# Patient Record
Sex: Male | Born: 1955
Health system: Southern US, Community
[De-identification: ages and names within clinical notes are randomized; demographics above are authoritative.]

## PROBLEM LIST (undated history)

## (undated) DIAGNOSIS — G510 Bell's palsy: Secondary | ICD-10-CM

## (undated) DIAGNOSIS — I739 Peripheral vascular disease, unspecified: Secondary | ICD-10-CM

## (undated) DIAGNOSIS — G8929 Other chronic pain: Secondary | ICD-10-CM

## (undated) DIAGNOSIS — D649 Anemia, unspecified: Secondary | ICD-10-CM

## (undated) DIAGNOSIS — Z5189 Encounter for other specified aftercare: Secondary | ICD-10-CM

## (undated) DIAGNOSIS — N529 Male erectile dysfunction, unspecified: Secondary | ICD-10-CM

## (undated) DIAGNOSIS — F1910 Other psychoactive substance abuse, uncomplicated: Secondary | ICD-10-CM

## (undated) DIAGNOSIS — I10 Essential (primary) hypertension: Secondary | ICD-10-CM

## (undated) DIAGNOSIS — E119 Type 2 diabetes mellitus without complications: Secondary | ICD-10-CM

## (undated) DIAGNOSIS — IMO0001 Reserved for inherently not codable concepts without codable children: Secondary | ICD-10-CM

## (undated) DIAGNOSIS — R7303 Prediabetes: Secondary | ICD-10-CM

## (undated) DIAGNOSIS — Z8619 Personal history of other infectious and parasitic diseases: Secondary | ICD-10-CM

## (undated) DIAGNOSIS — K649 Unspecified hemorrhoids: Secondary | ICD-10-CM

## (undated) DIAGNOSIS — K219 Gastro-esophageal reflux disease without esophagitis: Secondary | ICD-10-CM

## (undated) DIAGNOSIS — E785 Hyperlipidemia, unspecified: Secondary | ICD-10-CM

## (undated) DIAGNOSIS — A048 Other specified bacterial intestinal infections: Secondary | ICD-10-CM

## (undated) DIAGNOSIS — M1990 Unspecified osteoarthritis, unspecified site: Secondary | ICD-10-CM

## (undated) DIAGNOSIS — I219 Acute myocardial infarction, unspecified: Secondary | ICD-10-CM

## (undated) DIAGNOSIS — M549 Dorsalgia, unspecified: Secondary | ICD-10-CM

## (undated) DIAGNOSIS — G4730 Sleep apnea, unspecified: Secondary | ICD-10-CM

## (undated) DIAGNOSIS — I2510 Atherosclerotic heart disease of native coronary artery without angina pectoris: Secondary | ICD-10-CM

## (undated) DIAGNOSIS — C259 Malignant neoplasm of pancreas, unspecified: Secondary | ICD-10-CM

## (undated) DIAGNOSIS — D352 Benign neoplasm of pituitary gland: Secondary | ICD-10-CM

## (undated) HISTORY — DX: Other specified bacterial intestinal infections: A04.8

## (undated) HISTORY — DX: Prediabetes: R73.03

## (undated) HISTORY — PX: OTHER SURGICAL HISTORY: SHX169

## (undated) HISTORY — DX: Benign neoplasm of pituitary gland: D35.2

## (undated) HISTORY — DX: Hyperlipidemia, unspecified: E78.5

## (undated) HISTORY — PX: BACK SURGERY: SHX140

## (undated) HISTORY — PX: JOINT REPLACEMENT: SHX530

## (undated) HISTORY — DX: Unspecified hemorrhoids: K64.9

## (undated) HISTORY — DX: Bell's palsy: G51.0

## (undated) HISTORY — DX: Type 2 diabetes mellitus without complications: E11.9

## (undated) HISTORY — DX: Peripheral vascular disease, unspecified: I73.9

## (undated) HISTORY — DX: Encounter for other specified aftercare: Z51.89

## (undated) HISTORY — DX: Gastro-esophageal reflux disease without esophagitis: K21.9

## (undated) HISTORY — DX: Unspecified osteoarthritis, unspecified site: M19.90

## (undated) HISTORY — PX: COLONOSCOPY: SHX174

## (undated) HISTORY — DX: Atherosclerotic heart disease of native coronary artery without angina pectoris: I25.10

## (undated) HISTORY — DX: Essential (primary) hypertension: I10

## (undated) HISTORY — DX: Anemia, unspecified: D64.9

## (undated) HISTORY — DX: Malignant neoplasm of pancreas, unspecified: C25.9

## (undated) HISTORY — DX: Male erectile dysfunction, unspecified: N52.9

---
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
## 2002-11-19 ENCOUNTER — Encounter: Payer: Self-pay | Admitting: *Deleted

## 2002-11-19 ENCOUNTER — Emergency Department (HOSPITAL_COMMUNITY): Admission: EM | Admit: 2002-11-19 | Discharge: 2002-11-19 | Payer: Self-pay | Admitting: *Deleted

## 2003-11-17 DIAGNOSIS — I2510 Atherosclerotic heart disease of native coronary artery without angina pectoris: Secondary | ICD-10-CM

## 2003-11-17 DIAGNOSIS — Z951 Presence of aortocoronary bypass graft: Secondary | ICD-10-CM | POA: Insufficient documentation

## 2003-11-17 DIAGNOSIS — I219 Acute myocardial infarction, unspecified: Secondary | ICD-10-CM | POA: Insufficient documentation

## 2004-04-02 ENCOUNTER — Inpatient Hospital Stay (HOSPITAL_COMMUNITY): Admission: EM | Admit: 2004-04-02 | Discharge: 2004-04-04 | Payer: Self-pay | Admitting: Emergency Medicine

## 2004-06-02 ENCOUNTER — Emergency Department (HOSPITAL_COMMUNITY): Admission: EM | Admit: 2004-06-02 | Discharge: 2004-06-02 | Payer: Self-pay | Admitting: Emergency Medicine

## 2007-08-13 ENCOUNTER — Emergency Department (HOSPITAL_COMMUNITY): Admission: EM | Admit: 2007-08-13 | Discharge: 2007-08-13 | Payer: Self-pay | Admitting: Emergency Medicine

## 2007-11-09 ENCOUNTER — Emergency Department (HOSPITAL_COMMUNITY): Admission: EM | Admit: 2007-11-09 | Discharge: 2007-11-09 | Payer: Self-pay | Admitting: Emergency Medicine

## 2007-11-09 ENCOUNTER — Encounter (INDEPENDENT_AMBULATORY_CARE_PROVIDER_SITE_OTHER): Payer: Self-pay | Admitting: Emergency Medicine

## 2007-11-09 ENCOUNTER — Ambulatory Visit: Payer: Self-pay | Admitting: *Deleted

## 2008-04-16 ENCOUNTER — Ambulatory Visit: Payer: Self-pay | Admitting: *Deleted

## 2008-04-16 ENCOUNTER — Encounter: Payer: Self-pay | Admitting: Emergency Medicine

## 2008-04-17 ENCOUNTER — Inpatient Hospital Stay (HOSPITAL_COMMUNITY): Admission: EM | Admit: 2008-04-17 | Discharge: 2008-04-20 | Payer: Self-pay | Admitting: Interventional Cardiology

## 2008-04-18 ENCOUNTER — Encounter (INDEPENDENT_AMBULATORY_CARE_PROVIDER_SITE_OTHER): Payer: Self-pay | Admitting: Interventional Cardiology

## 2008-04-18 ENCOUNTER — Ambulatory Visit: Payer: Self-pay | Admitting: Vascular Surgery

## 2008-04-20 ENCOUNTER — Encounter (INDEPENDENT_AMBULATORY_CARE_PROVIDER_SITE_OTHER): Payer: Self-pay | Admitting: Interventional Cardiology

## 2008-04-20 ENCOUNTER — Ambulatory Visit: Payer: Self-pay | Admitting: *Deleted

## 2008-04-24 ENCOUNTER — Encounter (INDEPENDENT_AMBULATORY_CARE_PROVIDER_SITE_OTHER): Payer: Self-pay | Admitting: Nurse Practitioner

## 2008-04-24 ENCOUNTER — Ambulatory Visit (HOSPITAL_COMMUNITY): Admission: RE | Admit: 2008-04-24 | Discharge: 2008-04-24 | Payer: Self-pay | Admitting: General Surgery

## 2008-05-02 ENCOUNTER — Encounter (INDEPENDENT_AMBULATORY_CARE_PROVIDER_SITE_OTHER): Payer: Self-pay | Admitting: Nurse Practitioner

## 2008-05-10 ENCOUNTER — Encounter (INDEPENDENT_AMBULATORY_CARE_PROVIDER_SITE_OTHER): Payer: Self-pay | Admitting: General Surgery

## 2008-05-10 ENCOUNTER — Inpatient Hospital Stay (HOSPITAL_COMMUNITY): Admission: RE | Admit: 2008-05-10 | Discharge: 2008-05-19 | Payer: Self-pay | Admitting: General Surgery

## 2008-05-10 ENCOUNTER — Encounter (INDEPENDENT_AMBULATORY_CARE_PROVIDER_SITE_OTHER): Payer: Self-pay | Admitting: Nurse Practitioner

## 2008-05-10 DIAGNOSIS — Z9089 Acquired absence of other organs: Secondary | ICD-10-CM | POA: Insufficient documentation

## 2008-05-10 DIAGNOSIS — C259 Malignant neoplasm of pancreas, unspecified: Secondary | ICD-10-CM | POA: Insufficient documentation

## 2008-05-10 HISTORY — PX: OTHER SURGICAL HISTORY: SHX169

## 2008-06-05 ENCOUNTER — Encounter (INDEPENDENT_AMBULATORY_CARE_PROVIDER_SITE_OTHER): Payer: Self-pay | Admitting: Nurse Practitioner

## 2008-06-05 DIAGNOSIS — I10 Essential (primary) hypertension: Secondary | ICD-10-CM | POA: Insufficient documentation

## 2008-06-05 DIAGNOSIS — E785 Hyperlipidemia, unspecified: Secondary | ICD-10-CM | POA: Insufficient documentation

## 2008-06-12 ENCOUNTER — Encounter (INDEPENDENT_AMBULATORY_CARE_PROVIDER_SITE_OTHER): Payer: Self-pay | Admitting: Nurse Practitioner

## 2008-06-14 ENCOUNTER — Ambulatory Visit: Payer: Self-pay | Admitting: Nurse Practitioner

## 2008-06-14 ENCOUNTER — Ambulatory Visit: Payer: Self-pay | Admitting: Oncology

## 2008-06-14 DIAGNOSIS — R5383 Other fatigue: Secondary | ICD-10-CM

## 2008-06-14 DIAGNOSIS — R5381 Other malaise: Secondary | ICD-10-CM | POA: Insufficient documentation

## 2008-06-14 LAB — CONVERTED CEMR LAB
Nitrite: NEGATIVE
Protein, U semiquant: 30
Specific Gravity, Urine: 1.015
Urobilinogen, UA: 0.2
WBC Urine, dipstick: NEGATIVE

## 2008-06-21 LAB — COMPREHENSIVE METABOLIC PANEL
ALT: 39 U/L (ref 0–53)
AST: 19 U/L (ref 0–37)
Albumin: 4.5 g/dL (ref 3.5–5.2)
Calcium: 9.6 mg/dL (ref 8.4–10.5)
Chloride: 103 mEq/L (ref 96–112)
Potassium: 4.3 mEq/L (ref 3.5–5.3)

## 2008-06-21 LAB — CBC WITH DIFFERENTIAL/PLATELET
BASO%: 0.1 % (ref 0.0–2.0)
Eosinophils Absolute: 0.2 10*3/uL (ref 0.0–0.5)
HCT: 33.5 % — ABNORMAL LOW (ref 38.7–49.9)
MCHC: 33.9 g/dL (ref 32.0–35.9)
MONO#: 0.8 10*3/uL (ref 0.1–0.9)
NEUT#: 3.4 10*3/uL (ref 1.5–6.5)
NEUT%: 40.3 % (ref 40.0–75.0)
WBC: 8.4 10*3/uL (ref 4.0–10.0)
lymph#: 4.1 10*3/uL — ABNORMAL HIGH (ref 0.9–3.3)

## 2008-06-21 LAB — LACTATE DEHYDROGENASE: LDH: 186 U/L (ref 94–250)

## 2008-08-03 ENCOUNTER — Ambulatory Visit: Payer: Self-pay | Admitting: Oncology

## 2008-08-07 ENCOUNTER — Telehealth (INDEPENDENT_AMBULATORY_CARE_PROVIDER_SITE_OTHER): Payer: Self-pay | Admitting: Nurse Practitioner

## 2008-08-07 LAB — COMPREHENSIVE METABOLIC PANEL
ALT: 16 U/L (ref 0–53)
BUN: 12 mg/dL (ref 6–23)
CO2: 24 mEq/L (ref 19–32)
Calcium: 9.8 mg/dL (ref 8.4–10.5)
Chloride: 105 mEq/L (ref 96–112)
Creatinine, Ser: 0.92 mg/dL (ref 0.40–1.50)
Glucose, Bld: 110 mg/dL — ABNORMAL HIGH (ref 70–99)

## 2008-08-07 LAB — CBC WITH DIFFERENTIAL/PLATELET
BASO%: 1 % (ref 0.0–2.0)
Eosinophils Absolute: 0.1 10*3/uL (ref 0.0–0.5)
HCT: 38.2 % — ABNORMAL LOW (ref 38.7–49.9)
MCHC: 33.7 g/dL (ref 32.0–35.9)
MONO#: 0.4 10*3/uL (ref 0.1–0.9)
NEUT#: 2.2 10*3/uL (ref 1.5–6.5)
NEUT%: 37.7 % — ABNORMAL LOW (ref 40.0–75.0)
WBC: 5.8 10*3/uL (ref 4.0–10.0)
lymph#: 3 10*3/uL (ref 0.9–3.3)

## 2008-08-07 LAB — LACTATE DEHYDROGENASE: LDH: 155 U/L (ref 94–250)

## 2008-08-09 ENCOUNTER — Ambulatory Visit (HOSPITAL_COMMUNITY): Admission: RE | Admit: 2008-08-09 | Discharge: 2008-08-09 | Payer: Self-pay | Admitting: Oncology

## 2008-08-10 ENCOUNTER — Ambulatory Visit: Payer: Self-pay | Admitting: Nurse Practitioner

## 2008-08-22 ENCOUNTER — Ambulatory Visit: Payer: Self-pay | Admitting: Nurse Practitioner

## 2008-09-13 ENCOUNTER — Ambulatory Visit (HOSPITAL_COMMUNITY): Admission: RE | Admit: 2008-09-13 | Discharge: 2008-09-13 | Payer: Self-pay | Admitting: General Surgery

## 2008-09-26 ENCOUNTER — Ambulatory Visit: Payer: Self-pay | Admitting: Nurse Practitioner

## 2008-10-04 ENCOUNTER — Telehealth (INDEPENDENT_AMBULATORY_CARE_PROVIDER_SITE_OTHER): Payer: Self-pay | Admitting: Nurse Practitioner

## 2008-10-08 ENCOUNTER — Telehealth (INDEPENDENT_AMBULATORY_CARE_PROVIDER_SITE_OTHER): Payer: Self-pay | Admitting: Nurse Practitioner

## 2008-10-16 ENCOUNTER — Emergency Department (HOSPITAL_COMMUNITY): Admission: EM | Admit: 2008-10-16 | Discharge: 2008-10-16 | Payer: Self-pay | Admitting: Emergency Medicine

## 2008-10-22 ENCOUNTER — Telehealth (INDEPENDENT_AMBULATORY_CARE_PROVIDER_SITE_OTHER): Payer: Self-pay | Admitting: *Deleted

## 2008-10-25 ENCOUNTER — Ambulatory Visit: Payer: Self-pay | Admitting: Nurse Practitioner

## 2008-10-26 ENCOUNTER — Ambulatory Visit: Payer: Self-pay | Admitting: *Deleted

## 2008-10-26 ENCOUNTER — Encounter (INDEPENDENT_AMBULATORY_CARE_PROVIDER_SITE_OTHER): Payer: Self-pay | Admitting: Nurse Practitioner

## 2008-10-26 DIAGNOSIS — E7800 Pure hypercholesterolemia, unspecified: Secondary | ICD-10-CM | POA: Insufficient documentation

## 2008-10-26 LAB — CONVERTED CEMR LAB
Alkaline Phosphatase: 93 units/L (ref 39–117)
Eosinophils Absolute: 0.1 10*3/uL (ref 0.0–0.7)
Eosinophils Relative: 2 % (ref 0–5)
Glucose, Bld: 104 mg/dL — ABNORMAL HIGH (ref 70–99)
HCT: 41.6 % (ref 39.0–52.0)
HDL: 35 mg/dL — ABNORMAL LOW (ref >39)
LDL Cholesterol: 216 mg/dL — ABNORMAL HIGH (ref 0–99)
Lymphs Abs: 3.1 10*3/uL (ref 0.7–4.0)
MCV: 92.4 fL (ref 78.0–100.0)
Platelets: 298 10*3/uL (ref 150–400)
RDW: 14.3 % (ref 11.5–15.5)
Sodium: 142 meq/L (ref 135–145)
Total Bilirubin: 0.6 mg/dL (ref 0.3–1.2)
Total CHOL/HDL Ratio: 8.4
Total Protein: 8.4 g/dL — ABNORMAL HIGH (ref 6.0–8.3)
Triglycerides: 210 mg/dL — ABNORMAL HIGH (ref <150)
VLDL: 42 mg/dL — ABNORMAL HIGH (ref 0–40)
WBC: 5.8 10*3/uL (ref 4.0–10.5)

## 2008-10-31 ENCOUNTER — Emergency Department (HOSPITAL_COMMUNITY): Admission: EM | Admit: 2008-10-31 | Discharge: 2008-10-31 | Payer: Self-pay | Admitting: Emergency Medicine

## 2008-11-02 ENCOUNTER — Ambulatory Visit: Payer: Self-pay | Admitting: Oncology

## 2008-11-06 ENCOUNTER — Emergency Department (HOSPITAL_COMMUNITY): Admission: EM | Admit: 2008-11-06 | Discharge: 2008-11-06 | Payer: Self-pay | Admitting: Emergency Medicine

## 2008-11-06 ENCOUNTER — Encounter (INDEPENDENT_AMBULATORY_CARE_PROVIDER_SITE_OTHER): Payer: Self-pay | Admitting: Nurse Practitioner

## 2008-11-06 LAB — CBC WITH DIFFERENTIAL/PLATELET
Basophils Absolute: 0 10*3/uL (ref 0.0–0.1)
Eosinophils Absolute: 0.1 10*3/uL (ref 0.0–0.5)
HCT: 40 % (ref 38.7–49.9)
LYMPH%: 59.9 % — ABNORMAL HIGH (ref 14.0–48.0)
MCV: 91.7 fL (ref 81.6–98.0)
MONO#: 0.4 10*3/uL (ref 0.1–0.9)
MONO%: 7 % (ref 0.0–13.0)
NEUT#: 1.7 10*3/uL (ref 1.5–6.5)
NEUT%: 30.3 % — ABNORMAL LOW (ref 40.0–75.0)
Platelets: 308 10*3/uL (ref 145–400)
WBC: 5.6 10*3/uL (ref 4.0–10.0)

## 2008-11-06 LAB — COMPREHENSIVE METABOLIC PANEL
BUN: 10 mg/dL (ref 6–23)
CO2: 27 mEq/L (ref 19–32)
Creatinine, Ser: 1.1 mg/dL (ref 0.40–1.50)
Glucose, Bld: 126 mg/dL — ABNORMAL HIGH (ref 70–99)
Total Bilirubin: 0.5 mg/dL (ref 0.3–1.2)
Total Protein: 8.1 g/dL (ref 6.0–8.3)

## 2008-11-06 LAB — LACTATE DEHYDROGENASE: LDH: 153 U/L (ref 94–250)

## 2008-11-26 ENCOUNTER — Ambulatory Visit: Payer: Self-pay | Admitting: Internal Medicine

## 2008-11-29 ENCOUNTER — Encounter (INDEPENDENT_AMBULATORY_CARE_PROVIDER_SITE_OTHER): Payer: Self-pay | Admitting: Nurse Practitioner

## 2008-12-05 ENCOUNTER — Encounter (INDEPENDENT_AMBULATORY_CARE_PROVIDER_SITE_OTHER): Payer: Self-pay | Admitting: Cardiology

## 2008-12-05 ENCOUNTER — Ambulatory Visit (HOSPITAL_COMMUNITY): Admission: RE | Admit: 2008-12-05 | Discharge: 2008-12-05 | Payer: Self-pay | Admitting: Cardiology

## 2008-12-05 ENCOUNTER — Ambulatory Visit: Payer: Self-pay | Admitting: Vascular Surgery

## 2008-12-18 ENCOUNTER — Emergency Department (HOSPITAL_COMMUNITY): Admission: EM | Admit: 2008-12-18 | Discharge: 2008-12-18 | Payer: Self-pay | Admitting: Emergency Medicine

## 2008-12-18 ENCOUNTER — Ambulatory Visit (HOSPITAL_COMMUNITY): Admission: RE | Admit: 2008-12-18 | Discharge: 2008-12-18 | Payer: Self-pay | Admitting: Family Medicine

## 2008-12-18 ENCOUNTER — Ambulatory Visit: Payer: Self-pay | Admitting: Family Medicine

## 2008-12-18 DIAGNOSIS — R29810 Facial weakness: Secondary | ICD-10-CM | POA: Insufficient documentation

## 2008-12-19 ENCOUNTER — Ambulatory Visit: Payer: Self-pay | Admitting: Family Medicine

## 2008-12-19 ENCOUNTER — Encounter (INDEPENDENT_AMBULATORY_CARE_PROVIDER_SITE_OTHER): Payer: Self-pay | Admitting: Nurse Practitioner

## 2008-12-25 ENCOUNTER — Ambulatory Visit: Payer: Self-pay | Admitting: Family Medicine

## 2008-12-25 DIAGNOSIS — G510 Bell's palsy: Secondary | ICD-10-CM | POA: Insufficient documentation

## 2009-01-01 ENCOUNTER — Encounter (INDEPENDENT_AMBULATORY_CARE_PROVIDER_SITE_OTHER): Payer: Self-pay | Admitting: Family Medicine

## 2009-01-02 ENCOUNTER — Encounter: Admission: RE | Admit: 2009-01-02 | Discharge: 2009-01-02 | Payer: Self-pay | Admitting: Cardiology

## 2009-01-07 ENCOUNTER — Telehealth (INDEPENDENT_AMBULATORY_CARE_PROVIDER_SITE_OTHER): Payer: Self-pay | Admitting: *Deleted

## 2009-01-08 ENCOUNTER — Inpatient Hospital Stay (HOSPITAL_COMMUNITY): Admission: AD | Admit: 2009-01-08 | Discharge: 2009-01-09 | Payer: Self-pay | Admitting: Cardiology

## 2009-01-08 DIAGNOSIS — I739 Peripheral vascular disease, unspecified: Secondary | ICD-10-CM | POA: Insufficient documentation

## 2009-01-21 ENCOUNTER — Ambulatory Visit: Payer: Self-pay | Admitting: Family Medicine

## 2009-02-01 ENCOUNTER — Ambulatory Visit: Payer: Self-pay | Admitting: Oncology

## 2009-02-05 ENCOUNTER — Encounter (INDEPENDENT_AMBULATORY_CARE_PROVIDER_SITE_OTHER): Payer: Self-pay | Admitting: Nurse Practitioner

## 2009-02-05 LAB — CEA: CEA: 3.4 ng/mL (ref 0.0–5.0)

## 2009-02-05 LAB — COMPREHENSIVE METABOLIC PANEL
BUN: 14 mg/dL (ref 6–23)
CO2: 24 mEq/L (ref 19–32)
Creatinine, Ser: 1.2 mg/dL (ref 0.40–1.50)
Glucose, Bld: 144 mg/dL — ABNORMAL HIGH (ref 70–99)
Total Bilirubin: 0.3 mg/dL (ref 0.3–1.2)

## 2009-02-05 LAB — CBC WITH DIFFERENTIAL/PLATELET
Basophils Absolute: 0.1 10*3/uL (ref 0.0–0.1)
Eosinophils Absolute: 0.5 10*3/uL (ref 0.0–0.5)
HGB: 12.1 g/dL — ABNORMAL LOW (ref 13.0–17.1)
LYMPH%: 53.4 % — ABNORMAL HIGH (ref 14.0–49.0)
MCV: 92.9 fL (ref 79.3–98.0)
MONO%: 11 % (ref 0.0–14.0)
NEUT#: 1.3 10*3/uL — ABNORMAL LOW (ref 1.5–6.5)
NEUT%: 24.7 % — ABNORMAL LOW (ref 39.0–75.0)
Platelets: 384 10*3/uL (ref 140–400)
RBC: 3.92 10*6/uL — ABNORMAL LOW (ref 4.20–5.82)

## 2009-02-05 LAB — CANCER ANTIGEN 19-9: CA 19-9: 10.5 U/mL (ref <35.0)

## 2009-02-08 ENCOUNTER — Encounter (INDEPENDENT_AMBULATORY_CARE_PROVIDER_SITE_OTHER): Payer: Self-pay | Admitting: Nurse Practitioner

## 2009-02-12 ENCOUNTER — Encounter (INDEPENDENT_AMBULATORY_CARE_PROVIDER_SITE_OTHER): Payer: Self-pay | Admitting: Nurse Practitioner

## 2009-02-18 ENCOUNTER — Encounter (INDEPENDENT_AMBULATORY_CARE_PROVIDER_SITE_OTHER): Payer: Self-pay | Admitting: Nurse Practitioner

## 2009-02-18 ENCOUNTER — Ambulatory Visit (HOSPITAL_COMMUNITY): Admission: RE | Admit: 2009-02-18 | Discharge: 2009-02-18 | Payer: Self-pay | Admitting: Cardiology

## 2009-02-26 ENCOUNTER — Encounter (INDEPENDENT_AMBULATORY_CARE_PROVIDER_SITE_OTHER): Payer: Self-pay | Admitting: Nurse Practitioner

## 2009-03-06 ENCOUNTER — Ambulatory Visit: Payer: Self-pay | Admitting: Family Medicine

## 2009-03-06 DIAGNOSIS — M25519 Pain in unspecified shoulder: Secondary | ICD-10-CM | POA: Insufficient documentation

## 2009-04-16 ENCOUNTER — Ambulatory Visit: Payer: Self-pay | Admitting: Family Medicine

## 2009-04-16 DIAGNOSIS — N529 Male erectile dysfunction, unspecified: Secondary | ICD-10-CM | POA: Insufficient documentation

## 2009-04-17 ENCOUNTER — Encounter (INDEPENDENT_AMBULATORY_CARE_PROVIDER_SITE_OTHER): Payer: Self-pay | Admitting: Nurse Practitioner

## 2009-04-19 ENCOUNTER — Telehealth (INDEPENDENT_AMBULATORY_CARE_PROVIDER_SITE_OTHER): Payer: Self-pay | Admitting: Family Medicine

## 2009-05-07 ENCOUNTER — Ambulatory Visit: Payer: Self-pay | Admitting: Oncology

## 2009-05-09 ENCOUNTER — Encounter (INDEPENDENT_AMBULATORY_CARE_PROVIDER_SITE_OTHER): Payer: Self-pay | Admitting: Nurse Practitioner

## 2009-05-09 LAB — COMPREHENSIVE METABOLIC PANEL
ALT: 22 U/L (ref 0–53)
CO2: 24 mEq/L (ref 19–32)
Chloride: 103 mEq/L (ref 96–112)
Potassium: 4.5 mEq/L (ref 3.5–5.3)
Total Bilirubin: 0.3 mg/dL (ref 0.3–1.2)
Total Protein: 7.4 g/dL (ref 6.0–8.3)

## 2009-05-09 LAB — CBC WITH DIFFERENTIAL/PLATELET
Basophils Absolute: 0 10*3/uL (ref 0.0–0.1)
EOS%: 2.5 % (ref 0.0–7.0)
Eosinophils Absolute: 0.1 10*3/uL (ref 0.0–0.5)
HCT: 34 % — ABNORMAL LOW (ref 38.4–49.9)
HGB: 11.7 g/dL — ABNORMAL LOW (ref 13.0–17.1)
MCH: 31.6 pg (ref 27.2–33.4)
MCV: 91.5 fL (ref 79.3–98.0)
MONO%: 8.8 % (ref 0.0–14.0)
NEUT#: 1.1 10*3/uL — ABNORMAL LOW (ref 1.5–6.5)
NEUT%: 25.3 % — ABNORMAL LOW (ref 39.0–75.0)

## 2009-05-09 LAB — LACTATE DEHYDROGENASE: LDH: 155 U/L (ref 94–250)

## 2009-05-10 ENCOUNTER — Ambulatory Visit (HOSPITAL_COMMUNITY): Admission: RE | Admit: 2009-05-10 | Discharge: 2009-05-10 | Payer: Self-pay | Admitting: Oncology

## 2009-07-15 ENCOUNTER — Ambulatory Visit: Payer: Self-pay | Admitting: Family Medicine

## 2009-08-07 ENCOUNTER — Ambulatory Visit: Payer: Self-pay | Admitting: Oncology

## 2009-08-14 ENCOUNTER — Ambulatory Visit: Payer: Self-pay | Admitting: Nurse Practitioner

## 2009-08-14 DIAGNOSIS — L280 Lichen simplex chronicus: Secondary | ICD-10-CM | POA: Insufficient documentation

## 2009-08-14 DIAGNOSIS — F17200 Nicotine dependence, unspecified, uncomplicated: Secondary | ICD-10-CM | POA: Insufficient documentation

## 2009-09-02 ENCOUNTER — Telehealth (INDEPENDENT_AMBULATORY_CARE_PROVIDER_SITE_OTHER): Payer: Self-pay | Admitting: Nurse Practitioner

## 2009-09-06 ENCOUNTER — Ambulatory Visit: Payer: Self-pay | Admitting: Oncology

## 2009-09-10 ENCOUNTER — Telehealth (INDEPENDENT_AMBULATORY_CARE_PROVIDER_SITE_OTHER): Payer: Self-pay | Admitting: Nurse Practitioner

## 2009-09-13 ENCOUNTER — Ambulatory Visit: Payer: Self-pay | Admitting: Nurse Practitioner

## 2009-09-19 DIAGNOSIS — E291 Testicular hypofunction: Secondary | ICD-10-CM | POA: Insufficient documentation

## 2009-09-19 LAB — CONVERTED CEMR LAB
AST: 31 units/L (ref 0–37)
Alkaline Phosphatase: 70 units/L (ref 39–117)
BUN: 14 mg/dL (ref 6–23)
Creatinine, Ser: 1.1 mg/dL (ref 0.40–1.50)
HDL: 39 mg/dL — ABNORMAL LOW (ref >39)
LDL Cholesterol: 89 mg/dL (ref 0–99)
Potassium: 5.3 meq/L (ref 3.5–5.3)
TSH: 1.452 microintl units/mL (ref 0.350–4.500)
Total Bilirubin: 0.5 mg/dL (ref 0.3–1.2)
Total CHOL/HDL Ratio: 3.8
VLDL: 19 mg/dL (ref 0–40)

## 2009-09-20 ENCOUNTER — Ambulatory Visit: Payer: Self-pay | Admitting: Nurse Practitioner

## 2009-09-20 DIAGNOSIS — K921 Melena: Secondary | ICD-10-CM | POA: Insufficient documentation

## 2009-10-22 ENCOUNTER — Encounter (INDEPENDENT_AMBULATORY_CARE_PROVIDER_SITE_OTHER): Payer: Self-pay | Admitting: Nurse Practitioner

## 2009-10-23 ENCOUNTER — Encounter (INDEPENDENT_AMBULATORY_CARE_PROVIDER_SITE_OTHER): Payer: Self-pay | Admitting: Nurse Practitioner

## 2009-10-23 ENCOUNTER — Ambulatory Visit (HOSPITAL_COMMUNITY): Admission: RE | Admit: 2009-10-23 | Discharge: 2009-10-23 | Payer: Self-pay | Admitting: Gastroenterology

## 2009-10-24 DIAGNOSIS — K649 Unspecified hemorrhoids: Secondary | ICD-10-CM | POA: Insufficient documentation

## 2009-10-31 ENCOUNTER — Telehealth (INDEPENDENT_AMBULATORY_CARE_PROVIDER_SITE_OTHER): Payer: Self-pay | Admitting: Nurse Practitioner

## 2009-12-06 LAB — CONVERTED CEMR LAB
Hemoglobin: 13.6 g/dL
RBC: 4.48 M/uL
RDW: 14.7 %

## 2010-01-06 ENCOUNTER — Encounter: Payer: Self-pay | Admitting: Emergency Medicine

## 2010-01-06 LAB — CONVERTED CEMR LAB
ALT: 24 units/L
AST: 26 units/L
Alkaline Phosphatase: 73 units/L
BUN: 7 mg/dL
CO2: 29 meq/L
Chloride: 102 meq/L
Creatinine, Ser: 0.82 mg/dL
Glucose, Bld: 108 mg/dL
Potassium: 4 meq/L
Sodium: 137 meq/L
Total Bilirubin: 0.7 mg/dL

## 2010-01-07 ENCOUNTER — Inpatient Hospital Stay (HOSPITAL_COMMUNITY): Admission: AD | Admit: 2010-01-07 | Discharge: 2010-01-10 | Payer: Self-pay | Admitting: Cardiology

## 2010-01-13 ENCOUNTER — Inpatient Hospital Stay (HOSPITAL_COMMUNITY): Admission: EM | Admit: 2010-01-13 | Discharge: 2010-01-14 | Payer: Self-pay | Admitting: Emergency Medicine

## 2010-01-15 ENCOUNTER — Ambulatory Visit: Payer: Self-pay | Admitting: Oncology

## 2010-01-16 ENCOUNTER — Encounter (INDEPENDENT_AMBULATORY_CARE_PROVIDER_SITE_OTHER): Payer: Self-pay | Admitting: Nurse Practitioner

## 2010-01-17 ENCOUNTER — Telehealth (INDEPENDENT_AMBULATORY_CARE_PROVIDER_SITE_OTHER): Payer: Self-pay | Admitting: Nurse Practitioner

## 2010-01-28 ENCOUNTER — Encounter (INDEPENDENT_AMBULATORY_CARE_PROVIDER_SITE_OTHER): Payer: Self-pay | Admitting: *Deleted

## 2010-02-01 ENCOUNTER — Emergency Department (HOSPITAL_COMMUNITY): Admission: EM | Admit: 2010-02-01 | Discharge: 2010-02-01 | Payer: Self-pay | Admitting: Emergency Medicine

## 2010-02-05 ENCOUNTER — Encounter (INDEPENDENT_AMBULATORY_CARE_PROVIDER_SITE_OTHER): Payer: Self-pay | Admitting: Nurse Practitioner

## 2010-02-05 LAB — CBC WITH DIFFERENTIAL/PLATELET
Basophils Absolute: 0 10*3/uL (ref 0.0–0.1)
EOS%: 2.2 % (ref 0.0–7.0)
HGB: 12.6 g/dL — ABNORMAL LOW (ref 13.0–17.1)
MCH: 31 pg (ref 27.2–33.4)
MONO#: 0.4 10*3/uL (ref 0.1–0.9)
NEUT#: 2.2 10*3/uL (ref 1.5–6.5)
RDW: 14 % (ref 11.0–14.6)
WBC: 4.9 10*3/uL (ref 4.0–10.3)
lymph#: 2.1 10*3/uL (ref 0.9–3.3)

## 2010-02-05 LAB — COMPREHENSIVE METABOLIC PANEL
Alkaline Phosphatase: 66 U/L (ref 39–117)
BUN: 12 mg/dL (ref 6–23)
CO2: 30 mEq/L (ref 19–32)
Chloride: 97 mEq/L (ref 96–112)
Potassium: 4 mEq/L (ref 3.5–5.3)
Total Bilirubin: 0.4 mg/dL (ref 0.3–1.2)
Total Protein: 7.4 g/dL (ref 6.0–8.3)

## 2010-02-05 LAB — LACTATE DEHYDROGENASE: LDH: 124 U/L (ref 94–250)

## 2010-02-05 LAB — CEA: CEA: 2 ng/mL (ref 0.0–5.0)

## 2010-02-06 ENCOUNTER — Encounter (INDEPENDENT_AMBULATORY_CARE_PROVIDER_SITE_OTHER): Payer: Self-pay | Admitting: Nurse Practitioner

## 2010-02-06 ENCOUNTER — Ambulatory Visit (HOSPITAL_COMMUNITY): Admission: RE | Admit: 2010-02-06 | Discharge: 2010-02-06 | Payer: Self-pay | Admitting: Oncology

## 2010-02-10 ENCOUNTER — Ambulatory Visit: Payer: Self-pay | Admitting: Nurse Practitioner

## 2010-02-10 DIAGNOSIS — A048 Other specified bacterial intestinal infections: Secondary | ICD-10-CM | POA: Insufficient documentation

## 2010-03-06 ENCOUNTER — Encounter (HOSPITAL_COMMUNITY): Admission: RE | Admit: 2010-03-06 | Discharge: 2010-03-17 | Payer: Self-pay | Admitting: Cardiovascular Disease

## 2010-03-11 ENCOUNTER — Ambulatory Visit: Payer: Self-pay | Admitting: Nurse Practitioner

## 2010-03-11 DIAGNOSIS — K219 Gastro-esophageal reflux disease without esophagitis: Secondary | ICD-10-CM | POA: Insufficient documentation

## 2010-03-11 LAB — CONVERTED CEMR LAB
Bilirubin Urine: NEGATIVE
Cholesterol, target level: 200 mg/dL
Glucose, Urine, Semiquant: NEGATIVE
HDL goal, serum: 40 mg/dL
Ketones, urine, test strip: NEGATIVE
LDL Goal: 100 mg/dL
Specific Gravity, Urine: 1.01
Urobilinogen, UA: 0.2
pH: 6

## 2010-05-31 IMAGING — CT CT PELVIS W/ CM
2 of 5 series · 17 of 46 positions shown, 19 images · IV contrast (agent unspecified)
Comparison: Chest CT [DATE]

CT ABDOMEN

CLINICAL DATA: Abdominal mass seen at chest CT.

CT ABDOMEN AND PELVIS WITH CONTRAST
TECHNIQUE: Multidetector CT imaging of the abdomen and pelvis was
performed using the standard protocol following bolus
administration of intravenous contrast.
Contrast: 100 ml [id number]

[Series 2: routine abdomen · axial · 0.70mm/px · z∈[-482,-72]mm · 14 of 93 slices shown, 16 images]
[im 6/93  soft-tissue]
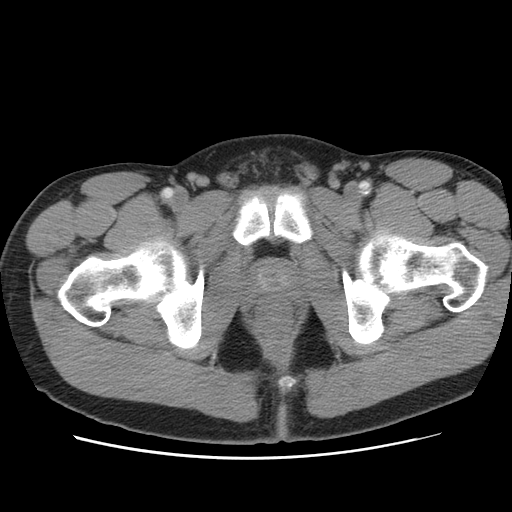
[im 6/93  bone]
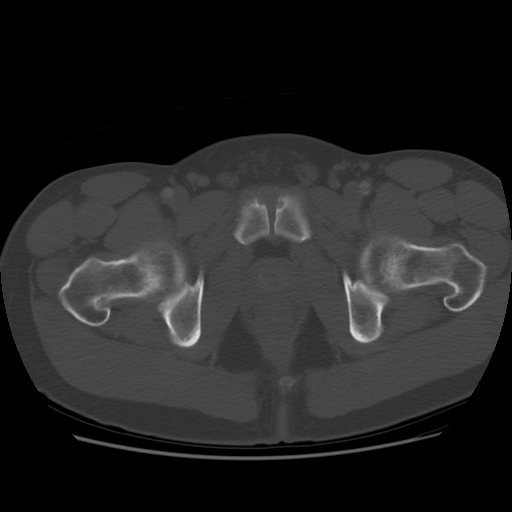
[im 11/93  soft-tissue]
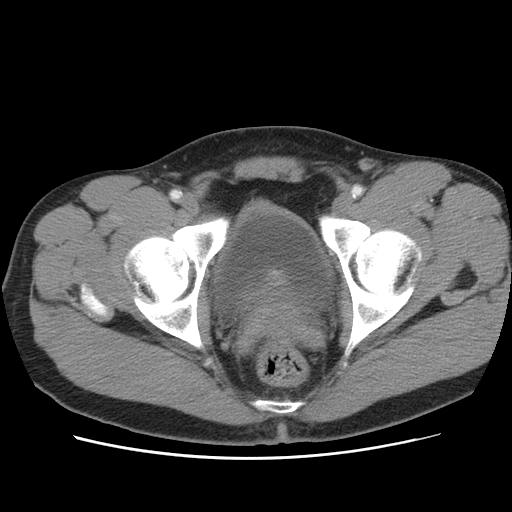
[im 17/93  soft-tissue]
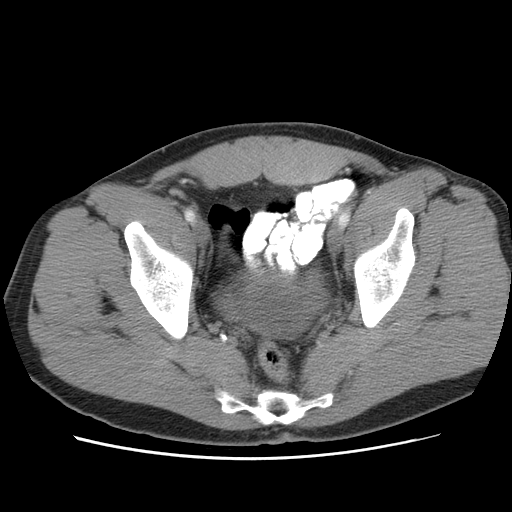
[im 28/93  soft-tissue]
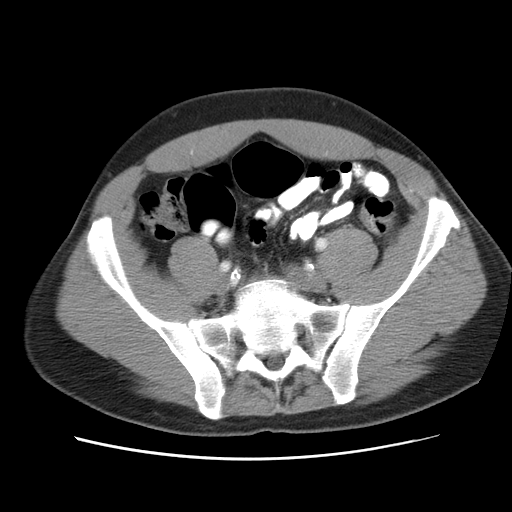
[im 33/93  soft-tissue]
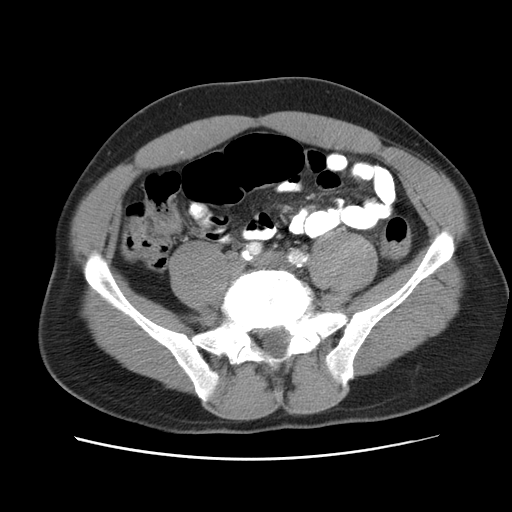
[im 38/93  soft-tissue]
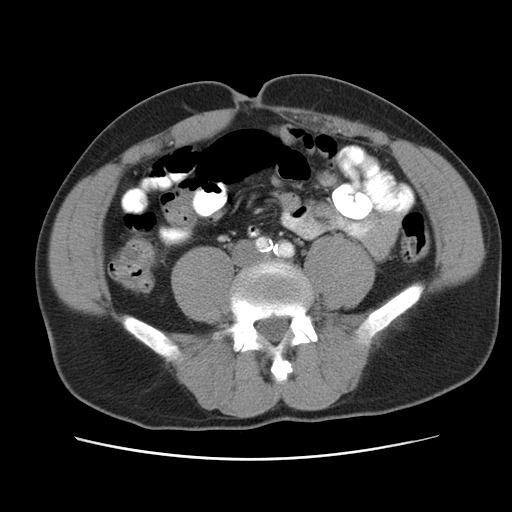
[im 44/93  soft-tissue]
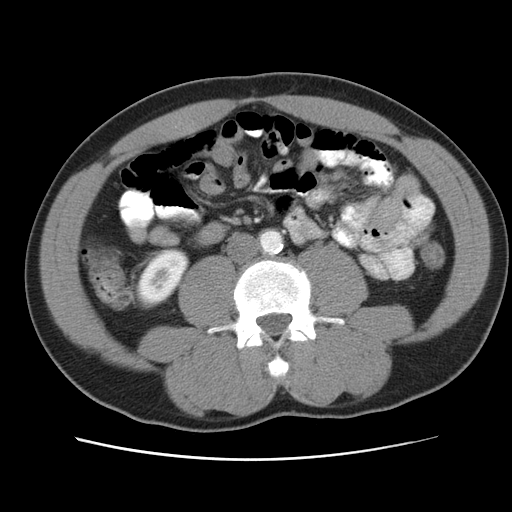
[im 49/93  soft-tissue]
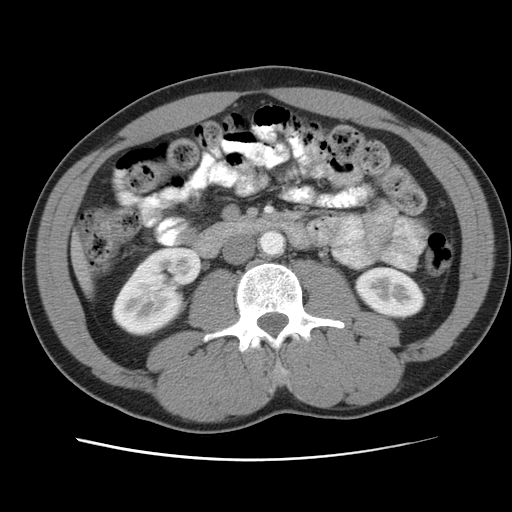
[im 55/93  soft-tissue]
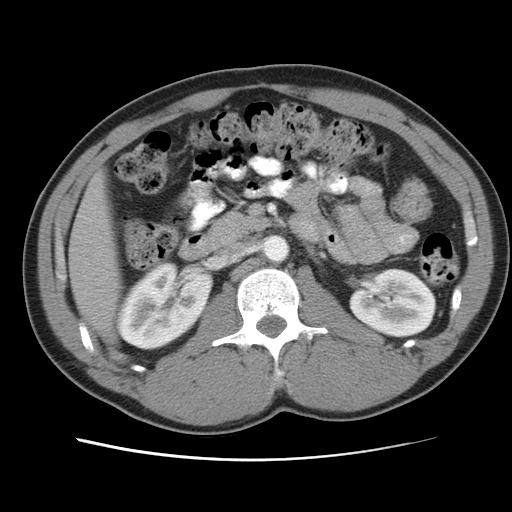
[im 55/93  bone]
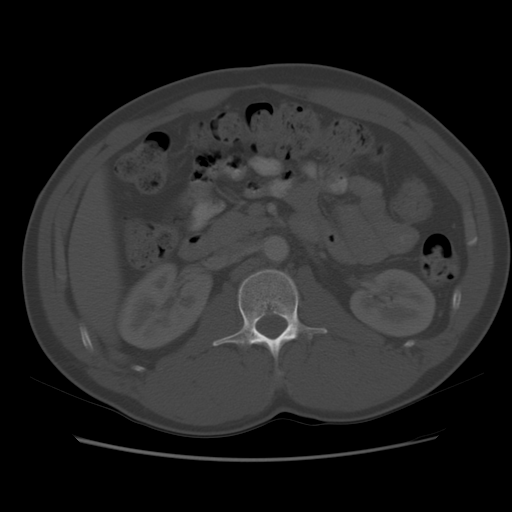
[im 60/93  soft-tissue]
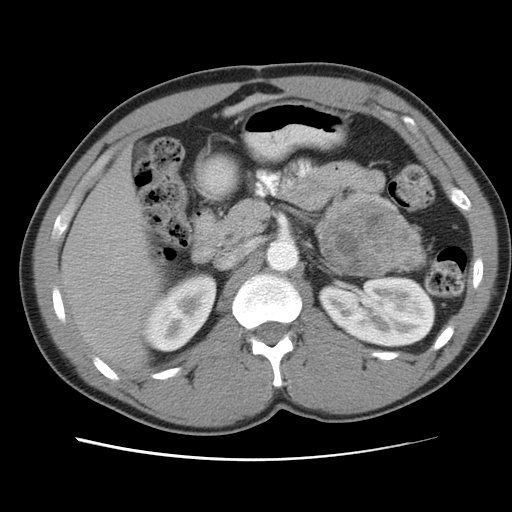
[im 71/93  soft-tissue]
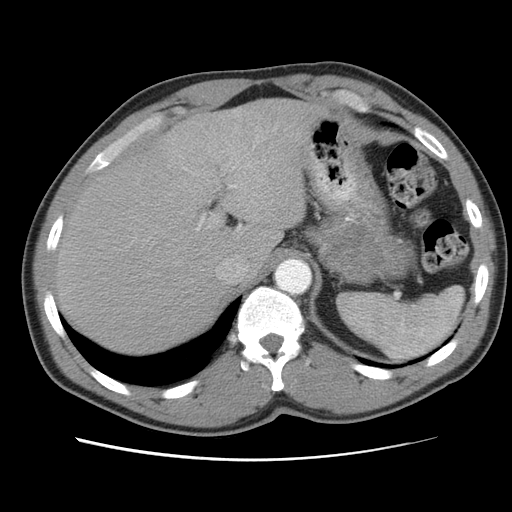
[im 76/93  soft-tissue]
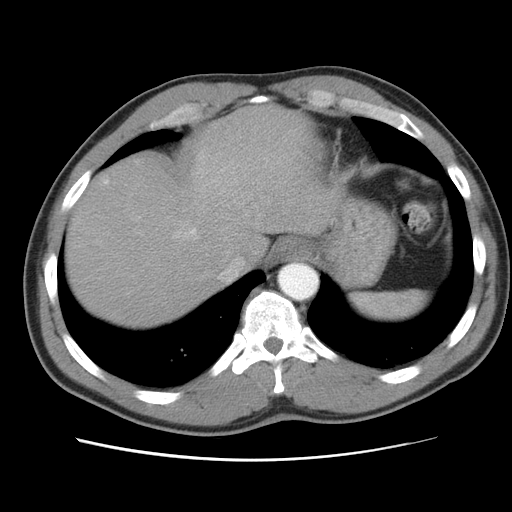
[im 82/93  soft-tissue]
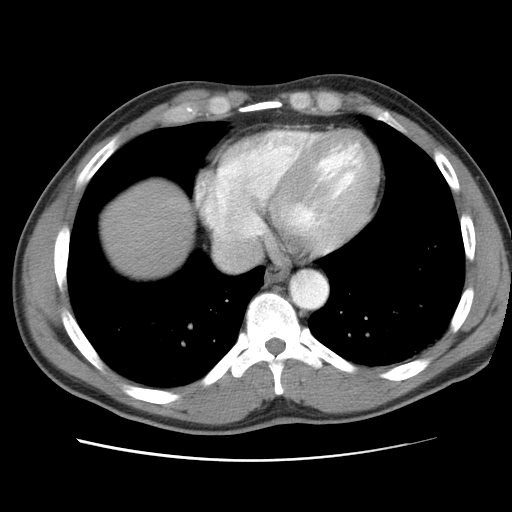
[im 87/93  soft-tissue]
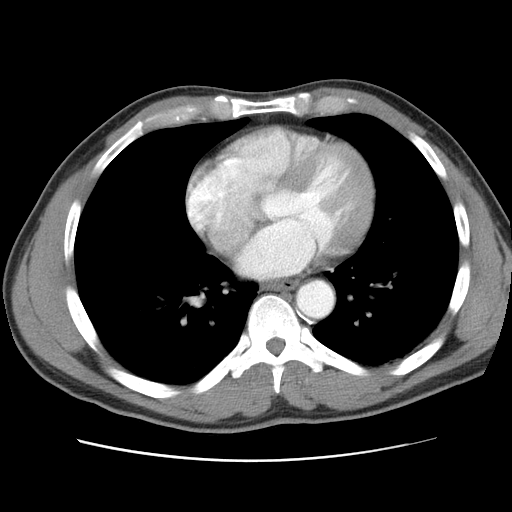

[Series 401: reformatted · coronal · 0.90mm/px · 3 of 113 slices shown]
[im 38/113  soft-tissue]
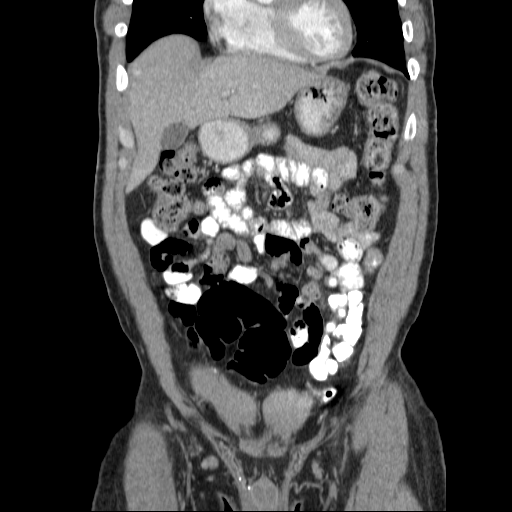
[im 50/113  soft-tissue]
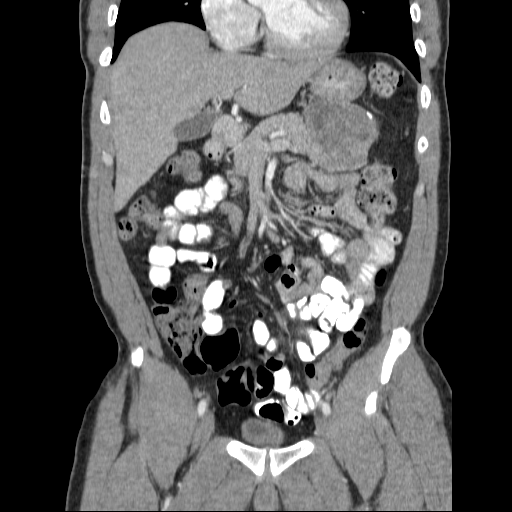
[im 63/113  soft-tissue]
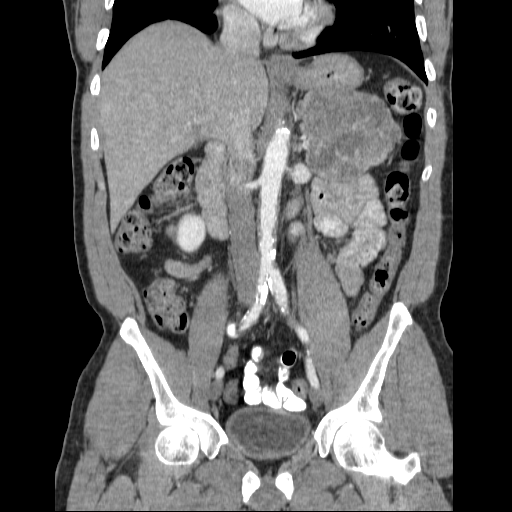

[17 of 46 positions shown; findings below may reference images not displayed]

FINDINGS: The liver contains an 8 mm low density in the lateral
segment of the left lobe.  This is nonspecific.  Elsewhere the
liver, I believe we are seeing some early areas of inhomogeneous
enhancement due to the arterial phase.  I do not suspect any other
liver abnormalities.  No calcified gallstones.  Spleen is normal.
There is a mass of the tail of the pancreas and measures 9.5 cm in
diameter.  This shows some peripheral and internal calcification.
This is a mixed density lesion.  This could represent a cystic
neoplasm of the pancreas, but adenocarcinoma is not excluded.
Remainder the pancreas appears normal.  The lesion touches the
posterior aspect of the stomach and has a surface also along the
left renal vein.  These structures do not appear invaded or
encompassed.  The adrenal glands are normal.  The kidneys are
normal.  The aorta and IVC show no acute pathology.  The patient
does have atherosclerosis.  No free fluid or air.  No primary bowel
pathology.
IMPRESSION: 9.5 cm mixed density lesion in the tail of pancreas that could be a
cystic neoplasm of the pancreas or could be an adenocarcinoma.  8
mm low density in the lateral segment of the left lobe of liver
which is nonspecific.  A few areas of early phase contrast
enhancement within the liver not felt to be significant.

CT PELVIS
FINDINGS: There is no free fluid, mass or adenopathy in the
pelvis.  Bladder, prostate gland and seminal vesicles are
unremarkable.  No primary bowel pathology.
IMPRESSION: Negative CT scan of the pelvis

## 2010-08-04 ENCOUNTER — Ambulatory Visit: Payer: Self-pay | Admitting: Nurse Practitioner

## 2010-08-04 DIAGNOSIS — L0293 Carbuncle, unspecified: Secondary | ICD-10-CM

## 2010-08-04 DIAGNOSIS — L0292 Furuncle, unspecified: Secondary | ICD-10-CM | POA: Insufficient documentation

## 2010-08-04 LAB — CONVERTED CEMR LAB
Blood in Urine, dipstick: NEGATIVE
Glucose, Urine, Semiquant: NEGATIVE
Nitrite: NEGATIVE
Specific Gravity, Urine: >=1.03
WBC Urine, dipstick: NEGATIVE
pH: 5.5

## 2010-08-05 ENCOUNTER — Encounter (INDEPENDENT_AMBULATORY_CARE_PROVIDER_SITE_OTHER): Payer: Self-pay | Admitting: Nurse Practitioner

## 2010-08-05 DIAGNOSIS — D649 Anemia, unspecified: Secondary | ICD-10-CM | POA: Insufficient documentation

## 2010-08-05 DIAGNOSIS — R7989 Other specified abnormal findings of blood chemistry: Secondary | ICD-10-CM | POA: Insufficient documentation

## 2010-08-05 LAB — CONVERTED CEMR LAB
ALT: 22 units/L (ref 0–53)
AST: 23 units/L (ref 0–37)
BUN: 13 mg/dL (ref 6–23)
Basophils Absolute: 0.1 10*3/uL (ref 0.0–0.1)
Basophils Relative: 1 % (ref 0–1)
Calcium: 10.2 mg/dL (ref 8.4–10.5)
Chloride: 103 meq/L (ref 96–112)
Cholesterol: 151 mg/dL (ref 0–200)
Creatinine, Ser: 1.03 mg/dL (ref 0.40–1.50)
Eosinophils Relative: 3 % (ref 0–5)
HCT: 38.3 % — ABNORMAL LOW (ref 39.0–52.0)
HDL: 57 mg/dL (ref >39)
Hemoglobin: 12.5 g/dL — ABNORMAL LOW (ref 13.0–17.0)
MCHC: 32.6 g/dL (ref 30.0–36.0)
MCV: 85.9 fL (ref 78.0–100.0)
Monocytes Absolute: 0.5 10*3/uL (ref 0.1–1.0)
Monocytes Relative: 9 % (ref 3–12)
Neutro Abs: 1.3 10*3/uL — ABNORMAL LOW (ref 1.7–7.7)
RBC: 4.46 M/uL (ref 4.22–5.81)
RDW: 18.9 % — ABNORMAL HIGH (ref 11.5–15.5)
Total Bilirubin: 0.7 mg/dL (ref 0.3–1.2)
Total CHOL/HDL Ratio: 2.6
VLDL: 14 mg/dL (ref 0–40)

## 2010-08-07 ENCOUNTER — Ambulatory Visit: Payer: Self-pay | Admitting: Oncology

## 2010-08-25 ENCOUNTER — Inpatient Hospital Stay (HOSPITAL_COMMUNITY): Admission: EM | Admit: 2010-08-25 | Discharge: 2010-08-26 | Payer: Self-pay | Admitting: Emergency Medicine

## 2010-09-08 ENCOUNTER — Ambulatory Visit: Payer: Self-pay | Admitting: Oncology

## 2010-09-08 ENCOUNTER — Encounter (INDEPENDENT_AMBULATORY_CARE_PROVIDER_SITE_OTHER): Payer: Self-pay | Admitting: Nurse Practitioner

## 2010-09-08 LAB — COMPREHENSIVE METABOLIC PANEL
Albumin: 4.7 g/dL (ref 3.5–5.2)
Alkaline Phosphatase: 57 U/L (ref 39–117)
BUN: 11 mg/dL (ref 6–23)
Creatinine, Ser: 0.98 mg/dL (ref 0.40–1.50)
Glucose, Bld: 106 mg/dL — ABNORMAL HIGH (ref 70–99)
Total Bilirubin: 0.4 mg/dL (ref 0.3–1.2)

## 2010-09-08 LAB — CBC WITH DIFFERENTIAL/PLATELET
Basophils Absolute: 0 10*3/uL (ref 0.0–0.1)
EOS%: 8.5 % — ABNORMAL HIGH (ref 0.0–7.0)
Eosinophils Absolute: 0.5 10*3/uL (ref 0.0–0.5)
HGB: 12.4 g/dL — ABNORMAL LOW (ref 13.0–17.1)
LYMPH%: 51.1 % — ABNORMAL HIGH (ref 14.0–49.0)
MCH: 29.8 pg (ref 27.2–33.4)
MCV: 88.6 fL (ref 79.3–98.0)
MONO%: 11.5 % (ref 0.0–14.0)
NEUT#: 1.5 10*3/uL (ref 1.5–6.5)
NEUT%: 28.7 % — ABNORMAL LOW (ref 39.0–75.0)
Platelets: 285 10*3/uL (ref 140–400)

## 2010-09-08 LAB — CEA: CEA: 1.8 ng/mL (ref 0.0–5.0)

## 2010-09-08 LAB — CANCER ANTIGEN 19-9: CA 19-9: <1.2 U/mL (ref <35.0)

## 2010-09-11 ENCOUNTER — Encounter (INDEPENDENT_AMBULATORY_CARE_PROVIDER_SITE_OTHER): Payer: Self-pay | Admitting: Nurse Practitioner

## 2010-09-12 ENCOUNTER — Encounter: Payer: Self-pay | Admitting: Thoracic Surgery (Cardiothoracic Vascular Surgery)

## 2010-09-12 ENCOUNTER — Ambulatory Visit: Payer: Self-pay | Admitting: Vascular Surgery

## 2010-09-12 ENCOUNTER — Ambulatory Visit: Payer: Self-pay | Admitting: Thoracic Surgery (Cardiothoracic Vascular Surgery)

## 2010-09-12 ENCOUNTER — Ambulatory Visit (HOSPITAL_COMMUNITY)
Admission: RE | Admit: 2010-09-12 | Discharge: 2010-09-13 | Payer: Self-pay | Source: Home / Self Care | Admitting: Cardiovascular Disease

## 2010-09-16 HISTORY — PX: CORONARY ARTERY BYPASS GRAFT: SHX141

## 2010-09-18 ENCOUNTER — Inpatient Hospital Stay (HOSPITAL_COMMUNITY)
Admission: RE | Admit: 2010-09-18 | Discharge: 2010-09-22 | Payer: Self-pay | Source: Home / Self Care | Admitting: Thoracic Surgery (Cardiothoracic Vascular Surgery)

## 2010-10-07 ENCOUNTER — Encounter (INDEPENDENT_AMBULATORY_CARE_PROVIDER_SITE_OTHER): Payer: Self-pay | Admitting: Nurse Practitioner

## 2010-10-13 ENCOUNTER — Ambulatory Visit: Payer: Self-pay | Admitting: Thoracic Surgery (Cardiothoracic Vascular Surgery)

## 2010-10-13 ENCOUNTER — Encounter
Admission: RE | Admit: 2010-10-13 | Discharge: 2010-10-13 | Payer: Self-pay | Admitting: Thoracic Surgery (Cardiothoracic Vascular Surgery)

## 2010-10-15 ENCOUNTER — Telehealth (INDEPENDENT_AMBULATORY_CARE_PROVIDER_SITE_OTHER): Payer: Self-pay | Admitting: Nurse Practitioner

## 2010-10-15 DIAGNOSIS — K029 Dental caries, unspecified: Secondary | ICD-10-CM | POA: Insufficient documentation

## 2010-10-16 ENCOUNTER — Encounter (INDEPENDENT_AMBULATORY_CARE_PROVIDER_SITE_OTHER): Payer: Self-pay | Admitting: Nurse Practitioner

## 2010-10-23 ENCOUNTER — Telehealth (INDEPENDENT_AMBULATORY_CARE_PROVIDER_SITE_OTHER): Payer: Self-pay | Admitting: Nurse Practitioner

## 2010-11-20 ENCOUNTER — Encounter (HOSPITAL_COMMUNITY)
Admission: RE | Admit: 2010-11-20 | Discharge: 2010-12-16 | Payer: Self-pay | Source: Home / Self Care | Attending: Surgery | Admitting: Surgery

## 2010-12-01 LAB — GLUCOSE, CAPILLARY
Glucose-Capillary: 152 mg/dL — ABNORMAL HIGH (ref 70–99)
Glucose-Capillary: 90 mg/dL (ref 70–99)
Glucose-Capillary: 161 mg/dL — ABNORMAL HIGH (ref 70–99)

## 2010-12-06 ENCOUNTER — Encounter: Payer: Self-pay | Admitting: Thoracic Surgery (Cardiothoracic Vascular Surgery)

## 2010-12-07 ENCOUNTER — Encounter: Payer: Self-pay | Admitting: Interventional Cardiology

## 2010-12-14 LAB — CONVERTED CEMR LAB
Chlamydia, Swab/Urine, PCR: NEGATIVE
GC Probe Amp, Urine: NEGATIVE
Microalb, Ur: 2.43 mg/dL — ABNORMAL HIGH (ref 0.00–1.89)
Protein, U semiquant: NEGATIVE
Urobilinogen, UA: 0.2
WBC Urine, dipstick: NEGATIVE

## 2010-12-16 NOTE — Letter (Signed)
Summary: [facility]PROGRESS [facility]  [facility]   Imported By: Arta Bruce 10/06/2010 14:20:22  _____________________________________________________________________  External Attachment:    Type:   Image     Comment:   External Document

## 2010-12-16 NOTE — Letter (Signed)
Summary: [facility]  [facility]   Imported By: Arta Bruce 10/06/2010 14:24:41  _____________________________________________________________________  External Attachment:    Type:   Image     Comment:   External Document

## 2010-12-16 NOTE — Letter (Signed)
Summary: TEST ORDER FORM//COLONOSCOPY//BLOOD IN  TEST ORDER FORM//COLONOSCOPY//BLOOD IN   Imported By: Arta Bruce 12/09/2009 15:33:51  _____________________________________________________________________  External Attachment:    Type:   Image     Comment:   External Document

## 2010-12-16 NOTE — Medication Information (Signed)
Summary: RX Folder//[facility] FOR [facility]  RX Folder//[facility]   Imported By: Arta Bruce 10/07/2010 14:34:29  _____________________________________________________________________  External Attachment:    Type:   Image     Comment:   External Document

## 2010-12-16 NOTE — Letter (Signed)
Summary: DENTAL REFERRAL  DENTAL REFERRAL   Imported By: Arta Bruce 10/16/2010 16:39:56  _____________________________________________________________________  External Attachment:    Type:   Image     Comment:   External Document

## 2010-12-16 NOTE — Miscellaneous (Signed)
Summary: Med update  Clinical Lists Changes  Medications: Added new medication of ZETIA 10 MG TABS (EZETIMIBE) One tablet by mouth daily for cholesterol **RX by Cardiology - Dr. Allyson Sabal** - Signed Rx of ZETIA 10 MG TABS (EZETIMIBE) One tablet by mouth daily for cholesterol **RX by Cardiology - Dr. Allyson Sabal**;  #30 x 11;  Signed;  Entered by: Lehman Prom FNP;  Authorized by: Lehman Prom FNP;  Method used: Historical    Prescriptions: ZETIA 10 MG TABS (EZETIMIBE) One tablet by mouth daily for cholesterol **RX by Cardiology - Dr. Allyson Sabal**  #30 x 11   Entered and Authorized by:   Lehman Prom FNP   Signed by:   Lehman Prom FNP on 10/07/2010   Method used:   Historical   RxID:   [id number]

## 2010-12-16 NOTE — Progress Notes (Signed)
Summary: Refill Androgel  Phone Note Refill Request   Refills Requested: Medication #1:  ANDROGEL 50 MG/5GM GEL Apply to dry Initial call taken by: [place] Shannon,  October 23, 2010 9:02 AM  Follow-up for Phone Call        OK to refill Androgel?  Last refill dated 09/2009 with 5 refills.  Dutch Quint RN  October 23, 2010 5:47 PM   Additional Follow-up for Phone Call Additional follow up Details #1::        pt has not been taking angrogel if he has not requested refills in 1 year he will need labs - testerosterone since he has not had in 1 year before testosterone refills Additional Follow-up by: Lehman Prom FNP,  October 23, 2010 7:30 PM    Additional Follow-up for Phone Call Additional follow up Details #2::    pt aware..[place] Shannon  October 24, 2010 2:31 PM

## 2010-12-16 NOTE — Assessment & Plan Note (Signed)
Summary: HTN/GERD   Vital Signs:  Patient profile:   55 year old male Height:      70 inches Weight:      209 pounds BMI:     30.10 Temp:     98.2 degrees F oral Pulse rate:   82 / minute Pulse rhythm:   regular Resp:     18 per minute BP sitting:   131 / 77  (left arm) Cuff size:   large  Vitals Entered By: [place] Shannon (March 11, 2010 8:39 AM) CC: f/u... pt needs refill on protonix... pt says if he starts walking he be sore alot...  pt say he ache from waist down...., Hypertension Management, Lipid Management, Abdominal Pain Is Patient Diabetic? No Pain Assessment Patient in pain? no       Does patient need assistance? Functional Status Self care Ambulation Normal   CC:  f/u... pt needs refill on protonix... pt says if he starts walking he be sore alot...  pt say he ache from waist down...., Hypertension Management, Lipid Management, and Abdominal Pain.  History of Present Illness:  Pt into office for 1 month f/u from hospital.  Hx of Pancreatic cancer - pt has been to oncology f/u last month.  He had Abd Ct which was normal.  It will be repeated by that office in 6 months  Dyspepsia History:      He has no alarm features of dyspepsia including no history of melena, hematochezia, dysphagia, persistent vomiting, or involuntary weight loss > 5%.  There is a prior history of GERD.  The patient does not have a prior history of documented ulcer disease.  The dominant symptom is heartburn or acid reflux.  An H-2 blocker medication is not currently being taken.  He has no history of a positive H. Pylori serology.  No previous upper endoscopy has been done.    Hypertension History:      He denies headache, chest pain, and palpitations.  He notes no problems with any antihypertensive medication side effects.        Positive major cardiovascular risk factors include male age 68 years old or older, hyperlipidemia, and hypertension.  Negative major cardiovascular risk factors  include negative family history for ischemic heart disease and non-tobacco-user status.        Positive history for target organ damage include ASHD (either angina/prior MI/prior CABG) and peripheral vascular disease.  Further assessment for target organ damage reveals no history of cardiac end-organ damage (CHF/LVH), stroke/TIA, renal insufficiency, or hypertensive retinopathy.    Lipid Management History:      Positive NCEP/ATP III risk factors include male age 53 years old or older, HDL cholesterol less than 40, hypertension, ASHD (either angina/prior MI/prior CABG), and peripheral vascular disease.  Negative NCEP/ATP III risk factors include no family history for ischemic heart disease, non-tobacco-user status, no prior stroke/TIA, and no history of aortic aneurysm.        The patient states that he does not know about the "Therapeutic Lifestyle Change" diet.  The patient does not know about adjunctive measures for cholesterol lowering.  Adjunctive measures started by the patient include ASA.  He expresses no side effects from his lipid-lowering medication.  The patient denies any symptoms to suggest myopathy or liver disease.       Habits & Providers  Alcohol-Tobacco-Diet     Alcohol drinks/day: 1     Tobacco Status: quit > 6 months     Tobacco Counseling: to remain off tobacco  products     Year Quit: 11/2008  Exercise-Depression-Behavior     Does Patient Exercise: yes     Exercise Counseling: to improve exercise regimen     Type of exercise: walking     Have you felt down or hopeless? no     Have you felt little pleasure in things? no     Depression Counseling: not indicated; screening negative for depression     Drug Use: no     Seat Belt Use: 100     Sun Exposure: occasionally  Comments: Pt has started cardiac rehab and goes 3 times per week.  he will go for 3 months  Current Medications (verified): 1)  Multivitamins   Tabs (Multiple Vitamin) .... Take One Tablet Daily 2)   Metoprolol Tartrate 25 Mg Tabs (Metoprolol Tartrate) .... One Tablet By Mouth Two Times A Day 3)  Plavix 75 Mg Tabs (Clopidogrel Bisulfate) .[name].. 1 Tablet By Mouth For Circulation 4)  Simvastatin 40 Mg Tabs (Simvastatin) .... One Tablet By Mouth Nightly For Cholesterol 5)  Naprosyn 500 Mg Tabs (Naproxen) .... Take 1 Tablet By Mouth Every 12 Hours 6)  Androgel 50 Mg/5gm Gel (Testosterone) .... Apply To Dry, Intact Skin On Back, Abdomen, Upper Arms or Thighs Daily 7)  Lisinopril 2.5 Mg Tabs (Lisinopril) .... One Tablet By Mouth Daily For Blood Pressure 8)  Isosorbide Mononitrate Cr 60 Mg Xr24h-Tab (Isosorbide Mononitrate) .... One Tablet By Mouth Daily 9)  Protonix 40 Mg Tbec (Pantoprazole Sodium) .... One Tablet By Mouth Two Times A Day For Stomach 10)  Aspir-Low 81 Mg Tbec (Aspirin) .... One Tablet By Mouth Daily 11)  Pletal 100 Mg Tabs (Cilostazol) .... One Tablet By Mouth Two Times A Day For Circulation  Allergies (verified): No Known Drug Allergies  Social History: Smoking Status:  quit > 6 months  Review of Systems CV:  Complains of leg cramps with exertion; denies chest pain or discomfort; c/o pain pain in bil lower extremities especially with ambulation .  when he walks for extended periods of time the pain returns and then with rest it resolves.  Pt was previously taking pletal which helped with symptoms but he has not taken it since his hospitalization. Resp:  Denies cough. GI:  Complains of indigestion.  Physical Exam  General:  alert.   Head:  normocephalic.   Lungs:  normal breath sounds.   Heart:  normal rate and regular rhythm.   Abdomen:  soft and non-tender.   Msk:  up to the exam table Neurologic:  alert & oriented X3.   Skin:  color normal.   Psych:  Oriented X3.     Impression & Recommendations:  Problem # 1:  ESSENTIAL HYPERTENSION, BENIGN (ICD-401.1)  BP is stable DASH diet reviewed with pt His updated medication list for this problem includes:     Metoprolol Tartrate 25 Mg Tabs (Metoprolol tartrate) ..... One tablet by mouth two times a day    Lisinopril 2.5 Mg Tabs (Lisinopril) ..... One tablet by mouth daily for blood pressure  Orders: UA Dipstick w/o Micro (manual) (16109)  Problem # 2:  HYPERCHOLESTEROLEMIA (ICD-272.0) stable  continue current medications His updated medication list for this problem includes:    Simvastatin 40 Mg Tabs (Simvastatin) ..... One tablet by mouth nightly for cholesterol  Problem # 3:  PERIPHERAL VASCULAR DISEASE WITH CLAUDICATION, RIGHT LEG (ICD-443.89) advised pt that he can restart pletal  Problem # 4:  CAD (ICD-414.00)  His updated medication list for this problem  includes:    Metoprolol Tartrate 25 Mg Tabs (Metoprolol tartrate) ..... One tablet by mouth two times a day    Plavix 75 Mg Tabs (Clopidogrel bisulfate) .[name]... 1 tablet by mouth for circulation    Lisinopril 2.5 Mg Tabs (Lisinopril) ..... One tablet by mouth daily for blood pressure    Isosorbide Mononitrate Cr 60 Mg Xr24h-tab (Isosorbide mononitrate) ..... One tablet by mouth daily    Aspir-low 81 Mg Tbec (Aspirin) ..... One tablet by mouth daily    Pletal 100 Mg Tabs (Cilostazol) ..... One tablet by mouth two times a day for circulation  Problem # 5:  GERD (ICD-530.81) advised pt of foods to avoid His updated medication list for this problem includes:    Protonix 40 Mg Tbec (Pantoprazole sodium) ..... One tablet by mouth two times a day for stomach  Complete Medication List: 1)  Multivitamins Tabs (Multiple vitamin) .... Take one tablet daily 2)  Metoprolol Tartrate 25 Mg Tabs (Metoprolol tartrate) .... One tablet by mouth two times a day 3)  Plavix 75 Mg Tabs (Clopidogrel bisulfate) .[name].. 1 tablet by mouth for circulation 4)  Simvastatin 40 Mg Tabs (Simvastatin) .... One tablet by mouth nightly for cholesterol 5)  Naprosyn 500 Mg Tabs (Naproxen) .... Take 1 tablet by mouth every 12 hours 6)  Androgel 50 Mg/5gm Gel (Testosterone)  .... Apply to dry, intact skin on back, abdomen, upper arms or thighs daily 7)  Lisinopril 2.5 Mg Tabs (Lisinopril) .... One tablet by mouth daily for blood pressure 8)  Isosorbide Mononitrate Cr 60 Mg Xr24h-tab (Isosorbide mononitrate) .... One tablet by mouth daily 9)  Protonix 40 Mg Tbec (Pantoprazole sodium) .... One tablet by mouth two times a day for stomach 10)  Aspir-low 81 Mg Tbec (Aspirin) .... One tablet by mouth daily 11)  Pletal 100 Mg Tabs (Cilostazol) .... One tablet by mouth two times a day for circulation  Hypertension Assessment/Plan:      The patient's hypertensive risk group is category C: Target organ damage and/or diabetes.  Today's blood pressure is 131/77.  His blood pressure goal is < 140/90.  Lipid Assessment/Plan:      Based on NCEP/ATP III, the patient's risk factor category is "history of coronary disease, peripheral vascular disease, cerebrovascular disease, or aortic aneurysm".  The patient's lipid goals are as follows: Total cholesterol goal is 200; LDL cholesterol goal is 100; HDL cholesterol goal is 40; Triglyceride goal is 150.    Patient Instructions: 1)  You can restart the medication for your legs - Pletal 100mg  by mouth two times a day (the new prescription has been sent to the pharmacy) 2)  continue all other medications as ordered 3)  Follow up in 3 months for blood pressure. Prescriptions: PROTONIX 40 MG TBEC (PANTOPRAZOLE SODIUM) One tablet by mouth two times a day for stomach  #60 x 11   Entered and Authorized by:   Lehman Prom FNP   Signed by:   Lehman Prom FNP on 03/11/2010   Method used:   Faxed to ...       [facility] (retail)       [address] [place], [place]  [address]       Ph: [phone]       Fax: [phone]   RxID:   [id number] PLETAL 100 MG TABS (CILOSTAZOL) One tablet by mouth two times a day for circulation  #60 x 11   Entered and  Authorized by:   Lehman Prom  FNP   Signed by:   Lehman Prom FNP on 03/11/2010   Method used:   Faxed to ...       [facility] (retail)       [address] [place], [place]  [address]       Ph: [phone]       Fax: [phone]   RxID:   [id number]   Laboratory Results   Urine Tests  Date/Time Received: March 11, 2010 8:46 AM   Routine Urinalysis   Glucose: negative   (Normal Range: Negative) Bilirubin: negative   (Normal Range: Negative) Ketone: negative   (Normal Range: Negative) Spec. Gravity: 1.010   (Normal Range: 1.003-1.035) Blood: negative   (Normal Range: Negative) pH: 6.0   (Normal Range: 5.0-8.0) Protein: negative   (Normal Range: Negative) Urobilinogen: 0.2   (Normal Range: 0-1) Nitrite: negative   (Normal Range: Negative) Leukocyte Esterace: negative   (Normal Range: Negative)

## 2010-12-16 NOTE — Letter (Signed)
Summary: *HSN Results Follow up  [facility]  [address] [place], [place] [address]   Phone: [phone]  Fax: [phone]      01/28/2010   Steven Hale [id number] [place], [place]  [address]   Dear  Steven Hale,                            ____S.Drinkard,FNP   ____D. Gore,FNP       ____B. McPherson,MD   ____V. Rankins,MD    ____E. Mulberry,MD    ____N. Daphine Deutscher, FNP  ____D. Reche Dixon, MD    ____K. Philipp Deputy, MD    ____Other     This letter is to inform you that your recent test(s):  _______Pap Smear    _______Lab Test     _______X-ray    _______ is within acceptable limits  _______ requires a medication change  _______ requires a follow-up lab visit  _______ requires a follow-up visit with your provider   Comments:  We have been trying to reach you at [phone].  We need discharge papers or pill bottles of medications you were discharged with from your recent hospital visit.  Please contact the office at your earliest convenience.       _________________________________________________________ If you have any questions, please contact our office                     Sincerely,  Levon Hedger [facility]

## 2010-12-16 NOTE — Progress Notes (Signed)
Summary: REF. TO DENTAL CLINIC  Phone Note Call from Patient Call back at Home Phone [phone]   Reason for Call: Referral Summary of Call: [facility] pt. MR Kingdon SAYS EVERY SINCE HE HAD OPEN HEART SURGERY ON 11/07 HE CANT EAT OR PUT ANYTHING COLD IN HIS MOUTH AND HE CALLED THE DENTAL CLINIC AND WAS ADVISED TO CALL HER TO GET A REFERRAL. Initial call taken by: Leodis Rains,  October 15, 2010 9:51 AM  Follow-up for Phone Call        forward to provider.... pt says he doesnt have any swelling... pt says he doesn't even have pain til he eats or drinks something cold. [facility] Follow-up by: [place] Shannon,  October 15, 2010 3:29 PM  Additional Follow-up for Phone Call Additional follow up Details #1::        Dental referral done - Arna Medici to fax back notify pt that the dental clinic will call with time/date of the appt  Verify the phone number with pt Additional Follow-up by: Lehman Prom FNP,  October 15, 2010 3:43 PM  New Problems: DENTAL CARIES (ICD-521.00)   Additional Follow-up for Phone Call Additional follow up Details #2::    Left message on answering machine for pt to call back.Marland KitchenMarland KitchenMarland KitchenArmenia Shannon  October 16, 2010 11:27 AM  Althia Forts TO [facility] DENTAL  [facility] # [id number] AD[address]ESS [address]  THEY WILL CONTACT THE PT TO MADE AN APPT .Marland KitchenCheryll Dessert  October 16, 2010 12:12 PM  pt aware.Marland KitchenMarland KitchenArmenia Shannon  October 16, 2010 12:26 PM   New Problems: DENTAL CARIES (ICD-521.00)

## 2010-12-16 NOTE — Letter (Signed)
Summary: [facility]  [facility]   Imported By: Arta Bruce 04/17/2010 15:23:11  _____________________________________________________________________  External Attachment:    Type:   Image     Comment:   External Document

## 2010-12-16 NOTE — Letter (Signed)
Summary: [facility]   Imported By: Arta Bruce 10/06/2010 14:22:35  _____________________________________________________________________  External Attachment:    Type:   Image     Comment:   External Document

## 2010-12-16 NOTE — Assessment & Plan Note (Signed)
Summary: Hospital F/U - NSTEMI   Vital Signs:  Patient profile:   55 year old male Height:      70 inches Weight:      204.7 pounds BMI:     29.48 Temp:     97.9 degrees F oral Pulse rate:   76 / minute Pulse rhythm:   regular Resp:     18 per minute BP sitting:   111 / 66  (left arm) Cuff size:   regular  Vitals Entered By: Arthor Captain (February 10, 2010 10:46 AM) CC: F/U hospital visit chest pain Is Patient Diabetic? No Pain Assessment Patient in pain? no       Does patient need assistance? Functional Status Self care Ambulation Normal   CC:  F/U hospital visit chest pain.  History of Present Illness:  Pt into the office for hospital f/u. - Non ST elevating MI  Pt admitted to  his provider that his eligibility (orange card) expired in December 2010 and pt stopped taking his medications.   Chest pain started in January 2011 but was infrequent.  Then in February 2011 the chest pain started mostly with exertion.  On the day of hospital admission he was in the shower and pain was "squeezing" on the left side of his chest.  Admitted on 01/06/2010 - chest pain (hospital admission noted) NSTEMI - cath by Dr. Sheliah Mends revealing high-grade circumflex disease, EF 50% with inferior hypokinesia and known total RCA.  Dr. Tresa Endo ultimately intervened percutaneously on his circumflex and obtuse marginal brance as well as his distal AV groove circumflex.  H.Pylori - noted during recent hospitalization.  pt was prescribed amoxil and flagyl of which he has finished.  Known h/o CAD, cardiac stenting and pancreatic cancer s/p Peripheral vascular interventions in both SFA's - last procedure 02/18/2009  Pt has been to outpt Cardiology visit on 01/16/2010(notes available for review)     Habits & Providers  Alcohol-Tobacco-Diet     Alcohol drinks/day: 1     Tobacco Status: quit     Tobacco Counseling: to remain off tobacco products     Year Quit:  11/2008  Exercise-Depression-Behavior     Does Patient Exercise: yes     Type of exercise: walking     Have you felt down or hopeless? no     Have you felt little pleasure in things? no     Depression Counseling: not indicated; screening negative for depression     Drug Use: no     Seat Belt Use: 100     Sun Exposure: occasionally  Allergies (verified): No Known Drug Allergies  Review of Systems General:  Denies fever. CV:  Complains of chest pain or discomfort and fatigue. Resp:  Denies cough. GI:  Complains of indigestion; denies abdominal pain, nausea, and vomiting.  Physical Exam  General:  alert.   Head:  normocephalic.   Lungs:  normal breath sounds.   Heart:  normal rate and regular rhythm.   Abdomen:  normal bowel sounds.   Msk:  up to the exam table Neurologic:  alert & oriented X3.   Skin:  color normal.   Psych:  Oriented X3.     Impression & Recommendations:  Problem # 1:  MYOCARDIAL INFARCTION (ICD-410.90) Pt has been to cardiology since his hospital d/c he has yet to get meds as ordered called [facility] and pt can pick up meds this afternoon His updated medication list for this problem includes:    Metoprolol Tartrate  25 Mg Tabs (Metoprolol tartrate) ..... One tablet by mouth two times a day    Plavix 75 Mg Tabs (Clopidogrel bisulfate) .[name]... 1 tablet by mouth for circulation    Lisinopril 2.5 Mg Tabs (Lisinopril) ..... One tablet by mouth daily for blood pressure    Isosorbide Mononitrate Cr 60 Mg Xr24h-tab (Isosorbide mononitrate) ..... One tablet by mouth daily    Aspir-low 81 Mg Tbec (Aspirin) ..... One tablet by mouth daily    Pletal 100 Mg Tabs (Cilostazol) ..... One table by mouth daily  Problem # 2:  ESSENTIAL HYPERTENSION, BENIGN (ICD-401.1) surprisingly ok despite the fact that pt has not taken his medications  His updated medication list for this problem includes:    Metoprolol Tartrate 25 Mg Tabs (Metoprolol tartrate) ..... One tablet by  mouth two times a day    Lisinopril 2.5 Mg Tabs (Lisinopril) ..... One tablet by mouth daily for blood pressure  Problem # 3:  HYPERCHOLESTEROLEMIA (ICD-272.0) continue current meds His updated medication list for this problem includes:    Simvastatin 40 Mg Tabs (Simvastatin) ..... One tablet by mouth nightly for cholesterol  Problem # 4:  CARCINOMA, PANCREAS (ICD-157.9) pt still does not understand why he needs to f/u at the cancer center he does not believe that he had cancer - reviewed dx with pt and that through a coincidental finding his pancreatic cancer was detected early but he still needs f/u  Complete Medication List: 1)  Multivitamins Tabs (Multiple vitamin) .... Take one tablet daily 2)  Metoprolol Tartrate 25 Mg Tabs (Metoprolol tartrate) .... One tablet by mouth two times a day 3)  Plavix 75 Mg Tabs (Clopidogrel bisulfate) .[name].. 1 tablet by mouth for circulation 4)  Simvastatin 40 Mg Tabs (Simvastatin) .... One tablet by mouth nightly for cholesterol 5)  Naprosyn 500 Mg Tabs (Naproxen) .... Take 1 tablet by mouth every 12 hours 6)  Androgel 50 Mg/5gm Gel (Testosterone) .... Apply to dry, intact skin on back, abdomen, upper arms or thighs daily 7)  Lisinopril 2.5 Mg Tabs (Lisinopril) .... One tablet by mouth daily for blood pressure 8)  Isosorbide Mononitrate Cr 60 Mg Xr24h-tab (Isosorbide mononitrate) .... One tablet by mouth daily 9)  Protonix 40 Mg Tbec (Pantoprazole sodium) .... One tablet by mouth two times a day for stomach (1 month) 10)  Aspir-low 81 Mg Tbec (Aspirin) .... One tablet by mouth daily 11)  Pletal 100 Mg Tabs (Cilostazol) .... One table by mouth daily  Patient Instructions: 1)  All your medications have been sent to [facility] pharmacy 2)  If you have any problems call back to this office. 3)  Take your medications daily as ordered 4)  Schedule a follow up at the cancer center to check your pancreas 5)  Follow up with n.martin,fnp in 4 weeks for blood  pressure   CXR  Procedure date:  01/02/2010  Findings:      No acute or significant findings

## 2010-12-16 NOTE — Medication Information (Signed)
Summary: Tax adviser   Imported By: Arta Bruce 03/25/2010 14:35:24  _____________________________________________________________________  External Attachment:    Type:   Image     Comment:   External Document

## 2010-12-16 NOTE — Progress Notes (Signed)
Summary: REFILLS/ HAD ANOTHER HEART ATTACK  Phone Note Call from Patient Call back at Home Phone [phone]   Reason for Call: Refill Medication Summary of Call: Steven Hale CALLED AND SAYS THAT HE HAD ANOTHER HEART ATTACK LAST WEEK, AND ALL OF HIS MEDS, HE WILL BE OUT OF TODAY AND HE SAYS THAT HE REALL NEEDS THEM TO BE CALLED INTO [facility]. HIS ELIGIBILITY HAS EXPIRED, BUT HIS APPT. IS ON MONDAY TO RENEW.  Steven Hale WOULD LIKE FOR SOMEONE TO CALL HIM TO LET HIM KNOW IF HE CAN GET REFILLS TODAY. Initial call taken by: Leodis Rains,  January 17, 2010 12:19 PM  Follow-up for Phone Call        forward to N. Daphine Deutscher, FNP Follow-up by: Levon Hedger,  January 17, 2010 2:32 PM  Additional Follow-up for Phone Call Additional follow up Details #1::        Discussed with Selena Batten on Friday - unsure whether pt was informed of my response but I need to see what medications pt was discharged from the hospital on.  He needs to bring either the d/c sheet or pill bottles from the hospital as these meds most likely have changed since his last visit here. no discharge meds on pt's hospital records that I reviewed Additional Follow-up by: Lehman Prom FNP,  January 20, 2010 11:58 AM    Additional Follow-up for Phone Call Additional follow up Details #2::    left message on machine for pt to rerturn call to the office. pt was scheduled on 01/23/10 and noshowed for appt. Levon Hedger  January 27, 2010 11:39 AM  Levon Hedger  January 28, 2010 10:24 AM Left message on machine for pt to return call to the office.  Will mail letter.

## 2010-12-16 NOTE — Letter (Signed)
[facility]  [facility]   Imported By: Arta Bruce 03/05/2010 11:11:07  _____________________________________________________________________  External Attachment:    Type:   Image     Comment:   External Document

## 2010-12-16 NOTE — Miscellaneous (Signed)
Summary: Med changes  Clinical Lists Changes  Medications: Changed medication from TOPROL XL 25 MG XR24H-TAB (METOPROLOL SUCCINATE) 1 tablet by mouth daily for blood pressure/heart to METOPROLOL TARTRATE 25 MG TABS (METOPROLOL TARTRATE) One tablet by mouth two times a day - Signed Changed medication from PLAVIX 75 MG TABS (CLOPIDOGREL BISULFATE) 1 tablet by mouth for circulation to PLAVIX 75 MG TABS (CLOPIDOGREL BISULFATE) 1 tablet by mouth for circulation - Signed Removed medication of PLETAL 100 MG TABS (CILOSTAZOL) 1 tablet by mouth two times a day for circulation Changed medication from PRAVASTATIN SODIUM 40 MG TABS (PRAVASTATIN SODIUM) 2 tablets by mouth nightly to SIMVASTATIN 40 MG TABS (SIMVASTATIN) One tablet by mouth nightly for cholesterol - Signed Removed medication of WELLBUTRIN SR 150 MG XR12H-TAB (BUPROPION HCL) Take 1/2  tablet by mouth every 12 hours x 7 days then 1/2 tablet each day x 7 days then stop the wellbutrin Added new medication of LISINOPRIL 2.5 MG TABS (LISINOPRIL) One tablet by mouth daily for blood pressure - Signed Removed medication of VIAGRA 50 MG TABS (SILDENAFIL CITRATE) 1-2 tab by mouth as needed for sexual activity. Max 100 mg in 24 hours Added new medication of ISOSORBIDE MONONITRATE CR 60 MG XR24H-TAB (ISOSORBIDE MONONITRATE) One tablet by mouth daily - Signed Removed medication of LISINOPRIL 20 MG TABS (LISINOPRIL) 1 tab daily Added new medication of PROTONIX 40 MG TBEC (PANTOPRAZOLE SODIUM) One tablet by mouth two times a day for stomach (1 month) - Signed Added new medication of ASPIR-LOW 81 MG TBEC (ASPIRIN) One tablet by mouth daily Rx of METOPROLOL TARTRATE 25 MG TABS (METOPROLOL TARTRATE) One tablet by mouth two times a day;  #60 x 5;  Signed;  Entered by: Levon Hedger;  Authorized by: Lehman Prom FNP;  Method used: Faxed to [facility], [address], [place], [place]  [address], Ph: [phone], Fax:  [phone] Rx of PLAVIX 75 MG TABS (CLOPIDOGREL BISULFATE) 1 tablet by mouth for circulation;  #30 x 5;  Signed;  Entered by: Levon Hedger;  Authorized by: Lehman Prom FNP;  Method used: Faxed to [facility], [address] [place], [place]  [address], Ph: [phone], Fax: [phone] Rx of SIMVASTATIN 40 MG TABS (SIMVASTATIN) One tablet by mouth nightly for cholesterol;  #30 x 5;  Signed;  Entered by: Levon Hedger;  Authorized by: Lehman Prom FNP;  Method used: Faxed to [facility], [address], [place], [place]  [address], Ph: [phone], Fax: [phone] Rx of LISINOPRIL 2.5 MG TABS (LISINOPRIL) One tablet by mouth daily for blood pressure;  #30 x 5;  Signed;  Entered by: Levon Hedger;  Authorized by: Lehman Prom FNP;  Method used: Faxed to [facility], [address] [place], [place]  [address], Ph: [phone], Fax: [phone] Rx of ISOSORBIDE MONONITRATE CR 60 MG XR24H-TAB (ISOSORBIDE MONONITRATE) One tablet by mouth daily;  #30 x 5;  Signed;  Entered by: Levon Hedger;  Authorized by: Lehman Prom FNP;  Method used: Faxed to [facility], [address], [place], [place]  [address], Ph: [phone], Fax: [phone] Rx of PROTONIX 40 MG TBEC (PANTOPRAZOLE SODIUM) One tablet by mouth two times a day for stomach (1 month);  #60 x 0;  Signed;  Entered by: Levon Hedger;  Authorized by: Lehman Prom FNP;  Method used: Faxed to [facility], [address], [place], [place]  [address],  Ph: [phone], Fax: [phone]    Prescriptions: PROTONIX 40 MG TBEC (PANTOPRAZOLE SODIUM) One tablet by mouth two times a day for stomach (1 month)  #60 x 0   Entered by:   Levon Hedger   Authorized by:   Lehman Prom FNP   Signed by:   Levon Hedger on 02/06/2010    Method used:   Faxed to ...       [facility] (retail)       [address] [place], [place]  [address]       Ph: [phone]       Fax: [phone]   RxID:   [id number] ISOSORBIDE MONONITRATE CR 60 MG XR24H-TAB (ISOSORBIDE MONONITRATE) One tablet by mouth daily  #30 x 5   Entered by:   Levon Hedger   Authorized by:   Lehman Prom FNP   Signed by:   Levon Hedger on 02/06/2010   Method used:   Faxed to ...       [facility] (retail)       [address] [place], [place]  [address]       Ph: [phone]       Fax: [phone]   RxID:   [id number] LISINOPRIL 2.5 MG TABS (LISINOPRIL) One tablet by mouth daily for blood pressure  #30 x 5   Entered by:   Levon Hedger   Authorized by:   Lehman Prom FNP   Signed by:   Levon Hedger on 02/06/2010   Method used:   Faxed to ...       [facility] (retail)       [address] [place], [place]  [address]       Ph: [phone]       Fax: [phone]   RxID:   [id number] SIMVASTATIN 40 MG TABS (SIMVASTATIN) One tablet by mouth nightly for cholesterol  #30 x 5   Entered by:   Levon Hedger   Authorized by:   Lehman Prom FNP   Signed by:   Levon Hedger on 02/06/2010   Method used:   Faxed to ...       [facility] - [facility] (retail)       [address] [place], [place]  [address]       Ph: [phone]       Fax: [phone]   RxID:   [id number] PLAVIX 75 MG TABS (CLOPIDOGREL BISULFATE) 1 tablet by mouth for circulation  #30 x 5   Entered by:   Levon Hedger   Authorized by:   Lehman Prom FNP   Signed by:   Levon Hedger on 02/06/2010   Method used:   Faxed to ...       [facility] (retail)       [address] [place], [place]  [address]       Ph: [phone]        Fax: [phone]   RxID:   [id number] METOPROLOL TARTRATE 25 MG TABS (METOPROLOL TARTRATE) One tablet by mouth two times a day  #60 x 5   Entered by:   Levon Hedger   Authorized by:   Lehman Prom FNP   Signed by:   Levon Hedger on 02/06/2010  Method used:   Faxed to ...       [facility] - [facility] (retail)       [address] [place], [place]  [address]       Ph: [phone]       Fax: [phone]   RxID:   [id number]

## 2010-12-16 NOTE — Letter (Signed)
Summary: Lipid Letter  [facility]  [address] [place], [place] [address]   Phone: [phone]  Fax: [phone]    08/05/2010  Steven Hale [address] [place], [place]  [address]  Dear [name]  We have carefully reviewed your last lipid profile from 08/04/2010 and the results are noted below with a summary of recommendations for lipid management.    Cholesterol:       151     Goal: less than 200   HDL "good" Cholesterol:   57     Goal: greater than 40   LDL "bad" Cholesterol:   80     Goal: less than 100   Triglycerides:       68     Goal: less than 150    Labs done during recent office visit shows that you are slightly anemic. Labs also show that your uric acid is slightly elevated which may have caused the problem you mentioned with your right big toe.  Try to avoid shellfish such as shrimp and oysters, beer, liver, and yeast to help keep these symptoms controlled.     Current Medications: 1)    Multivitamins   Tabs (Multiple vitamin) .... Take one tablet daily 2)    Metoprolol Tartrate 25 Mg Tabs (Metoprolol tartrate) .... One tablet by mouth two times a day 3)    Plavix 75 Mg Tabs (Clopidogrel bisulfate) .[name].. 1 tablet by mouth for circulation 4)    Simvastatin 40 Mg Tabs (Simvastatin) .... One tablet by mouth nightly for cholesterol 5)    Naprosyn 500 Mg Tabs (Naproxen) .... Take 1 tablet by mouth every 12 hours 6)    Androgel 50 Mg/5gm Gel (Testosterone) .... Apply to dry, intact skin on back, abdomen, upper arms or thighs daily 7)    Lisinopril 2.5 Mg Tabs (Lisinopril) .... One tablet by mouth daily for blood pressure 8)    Isosorbide Mononitrate Cr 60 Mg Xr24h-tab (Isosorbide mononitrate) .... One tablet by mouth daily 9)    Protonix 40 Mg Tbec (Pantoprazole sodium) .... One tablet by mouth two times a day for stomach 10)    Aspir-low 81 Mg Tbec (Aspirin) .... One tablet by mouth daily 11)    Pletal 100 Mg Tabs (Cilostazol)  .... One tablet by mouth two times a day for circulation 12)    Betasept Surgical Scrub 4 % Liqd (Chlorhexidine gluconate) .... Use to affected area three times per week  If you have any questions, please call. We appreciate being able to work with you.   Sincerely,    [facility] Lehman Prom FNP

## 2010-12-16 NOTE — Assessment & Plan Note (Signed)
Summary: HTN   Vital Signs:  Patient profile:   55 year old male Weight:      202.9 pounds Temp:     98.2 degrees F oral Pulse rate:   72 / minute Pulse rhythm:   regular Resp:     20 per minute BP sitting:   128 / 88  (left arm) Cuff size:   large  Vitals Entered By: Levon Hedger (August 04, 2010 11:47 AM) CC: follow-up visit...pt has been breaking out with boils, Lipid Management, Hypertension Management Is Patient Diabetic? No Pain Assessment Patient in pain? no       Does patient need assistance? Functional Status Self care Ambulation Normal   CC:  follow-up visit...pt has been breaking out with boils, Lipid Management, and Hypertension Management.  History of Present Illness:  Pt into the office for routine f/u  Only complaints today it that he continues to have boils - small areas in upper back. Not large enough to evolve into an abscess and most resolve on their own. But repeative nature of the problem is of concern to pt. Denies any profuse sweating Previous Dx for which he was prescribed betasept and bactrim at time of Dx.   He continues to use dial soap.  Hypertension History:      He denies headache, chest pain, and palpitations.  He notes no problems with any antihypertensive medication side effects.        Positive major cardiovascular risk factors include male age 40 years old or older, hyperlipidemia, and hypertension.  Negative major cardiovascular risk factors include no history of diabetes, negative family history for ischemic heart disease, and non-tobacco-user status.        Positive history for target organ damage include ASHD (either angina/prior MI/prior CABG) and peripheral vascular disease.  Further assessment for target organ damage reveals no history of cardiac end-organ damage (CHF/LVH), stroke/TIA, renal insufficiency, or hypertensive retinopathy.    Lipid Management History:      Positive NCEP/ATP III risk factors include male age 71  years old or older, HDL cholesterol less than 40, hypertension, ASHD (either angina/prior MI/prior CABG), and peripheral vascular disease.  Negative NCEP/ATP III risk factors include non-diabetic, no family history for ischemic heart disease, non-tobacco-user status, no prior stroke/TIA, and no history of aortic aneurysm.        The patient states that he does not know about the "Therapeutic Lifestyle Change" diet.  The patient does not know about adjunctive measures for cholesterol lowering.  He expresses no side effects from his lipid-lowering medication.  The patient denies any symptoms to suggest myopathy or liver disease.      Medications Prior to Update: 1)  Multivitamins   Tabs (Multiple Vitamin) .... Take One Tablet Daily 2)  Metoprolol Tartrate 25 Mg Tabs (Metoprolol Tartrate) .... One Tablet By Mouth Two Times A Day 3)  Plavix 75 Mg Tabs (Clopidogrel Bisulfate) .[name].. 1 Tablet By Mouth For Circulation 4)  Simvastatin 40 Mg Tabs (Simvastatin) .... One Tablet By Mouth Nightly For Cholesterol 5)  Naprosyn 500 Mg Tabs (Naproxen) .... Take 1 Tablet By Mouth Every 12 Hours 6)  Androgel 50 Mg/5gm Gel (Testosterone) .... Apply To Dry, Intact Skin On Back, Abdomen, Upper Arms or Thighs Daily 7)  Lisinopril 2.5 Mg Tabs (Lisinopril) .... One Tablet By Mouth Daily For Blood Pressure 8)  Isosorbide Mononitrate Cr 60 Mg Xr24h-Tab (Isosorbide Mononitrate) .... One Tablet By Mouth Daily 9)  Protonix 40 Mg Tbec (Pantoprazole Sodium) .... One Tablet  By Mouth Two Times A Day For Stomach 10)  Aspir-Low 81 Mg Tbec (Aspirin) .... One Tablet By Mouth Daily 11)  Pletal 100 Mg Tabs (Cilostazol) .... One Tablet By Mouth Two Times A Day For Circulation  Current Medications (verified): 1)  Multivitamins   Tabs (Multiple Vitamin) .... Take One Tablet Daily 2)  Metoprolol Tartrate 25 Mg Tabs (Metoprolol Tartrate) .... One Tablet By Mouth Two Times A Day 3)  Plavix 75 Mg Tabs (Clopidogrel Bisulfate) .[name].. 1 Tablet By  Mouth For Circulation 4)  Simvastatin 40 Mg Tabs (Simvastatin) .... One Tablet By Mouth Nightly For Cholesterol 5)  Naprosyn 500 Mg Tabs (Naproxen) .... Take 1 Tablet By Mouth Every 12 Hours 6)  Androgel 50 Mg/5gm Gel (Testosterone) .... Apply To Dry, Intact Skin On Back, Abdomen, Upper Arms or Thighs Daily 7)  Lisinopril 2.5 Mg Tabs (Lisinopril) .... One Tablet By Mouth Daily For Blood Pressure 8)  Isosorbide Mononitrate Cr 60 Mg Xr24h-Tab (Isosorbide Mononitrate) .... One Tablet By Mouth Daily 9)  Protonix 40 Mg Tbec (Pantoprazole Sodium) .... One Tablet By Mouth Two Times A Day For Stomach 10)  Aspir-Low 81 Mg Tbec (Aspirin) .... One Tablet By Mouth Daily 11)  Pletal 100 Mg Tabs (Cilostazol) .... One Tablet By Mouth Two Times A Day For Circulation  Allergies (verified): No Known Drug Allergies  Review of Systems CV:  Denies chest pain or discomfort. Resp:  Denies shortness of breath. GI:  Denies abdominal pain, nausea, and vomiting. Derm:  Complains of rash; c/o boils that occur spontaneously to his back..  Physical Exam  General:  alert.   Head:  normocephalic.   Lungs:  normal breath sounds.   Heart:  normal rate and regular rhythm.   Msk:  normal ROM.   Neurologic:  alert & oriented X3.   Skin:  right great toe hematoma multiple areas of healed papules - hyperpigmented Psych:  Oriented X3.     Impression & Recommendations:  Problem # 1:  ESSENTIAL HYPERTENSION, BENIGN (ICD-401.1)  His updated medication list for this problem includes:    Metoprolol Tartrate 25 Mg Tabs (Metoprolol tartrate) ..... One tablet by mouth two times a day    Lisinopril 2.5 Mg Tabs (Lisinopril) ..... One tablet by mouth daily for blood pressure  Orders: T-PSA ([address]-60454) T-CBC w/Diff (09811-91478) Rapid HIV  (29562) T-TSH (13086-57846) TLB-Uric Acid, Blood (84550-URIC) T-Urine Microalbumin w/creat. ratio [id number]) UA Dipstick w/o Micro (manual) (10272)  Problem # 2:  GERD  (ICD-530.81)  His updated medication list for this problem includes:    Protonix 40 Mg Tbec (Pantoprazole sodium) ..... One tablet by mouth two times a day for stomach  Problem # 3:  HYPERCHOLESTEROLEMIA (ICD-272.0)  His updated medication list for this problem includes:    Simvastatin 40 Mg Tabs (Simvastatin) ..... One tablet by mouth nightly for cholesterol  Problem # 4:  PERIPHERAL VASCULAR DISEASE WITH CLAUDICATION, RIGHT LEG (ICD-443.89)  Problem # 5:  BOILS, RECURRENT (ICD-680.9)  Complete Medication List: 1)  Multivitamins Tabs (Multiple vitamin) .... Take one tablet daily 2)  Metoprolol Tartrate 25 Mg Tabs (Metoprolol tartrate) .... One tablet by mouth two times a day 3)  Plavix 75 Mg Tabs (Clopidogrel bisulfate) .[name].. 1 tablet by mouth for circulation 4)  Simvastatin 40 Mg Tabs (Simvastatin) .... One tablet by mouth nightly for cholesterol 5)  Naprosyn 500 Mg Tabs (Naproxen) .... Take 1 tablet by mouth every 12 hours 6)  Androgel 50 Mg/5gm Gel (Testosterone) .... Apply to dry, intact  skin on back, abdomen, upper arms or thighs daily 7)  Lisinopril 2.5 Mg Tabs (Lisinopril) .... One tablet by mouth daily for blood pressure 8)  Isosorbide Mononitrate Cr 60 Mg Xr24h-tab (Isosorbide mononitrate) .... One tablet by mouth daily 9)  Protonix 40 Mg Tbec (Pantoprazole sodium) .... One tablet by mouth two times a day for stomach 10)  Aspir-low 81 Mg Tbec (Aspirin) .... One tablet by mouth daily 11)  Pletal 100 Mg Tabs (Cilostazol) .... One tablet by mouth two times a day for circulation 12)  Betasept Surgical Scrub 4 % Liqd (Chlorhexidine gluconate) .... Use to affected area three times per week  Other Orders: T-Lipid Profile (45409-81191) T-Comprehensive Metabolic Panel (47829-56213)  Hypertension Assessment/Plan:      The patient's hypertensive risk group is category C: Target organ damage and/or diabetes.  Today's blood pressure is 128/88.  His blood pressure goal is <  140/90.  Lipid Assessment/Plan:      Based on NCEP/ATP III, the patient's risk factor category is "history of coronary disease, peripheral vascular disease, cerebrovascular disease, or aortic aneurysm".  The patient's lipid goals are as follows: Total cholesterol goal is 200; LDL cholesterol goal is 100; HDL cholesterol goal is 40; Triglyceride goal is 150.    Patient Instructions: 1)  Your labs will be checked today and you will be notified of the results 2)  Toe - hematoma.  Most likely from some trauma to your toe.  This nail will need to grow out. 3)  Betasept surgical scrub has been sent to the pharmacy.  You should use this three times per week on your skin 4)  Flu vaccines will be here next month.  Schedule a nurse visit for 4 weeks. Prescriptions: BETASEPT SURGICAL SCRUB 4 % LIQD (CHLORHEXIDINE GLUCONATE) Use to affected area three times per week  #251ml x 1   Entered and Authorized by:   Lehman Prom FNP   Signed by:   Lehman Prom FNP on 08/04/2010   Method used:   Faxed to ...       [facility] - Pharmac (retail)       [address] [place], [place]  [address]       Ph: [phone]       Fax: [phone]   RxID:   [id number] ISOSORBIDE MONONITRATE CR 60 MG XR24H-TAB (ISOSORBIDE MONONITRATE) One tablet by mouth daily  #30 x 5   Entered and Authorized by:   Lehman Prom FNP   Signed by:   Lehman Prom FNP on 08/04/2010   Method used:   Faxed to ...       [facility] - Pharmac (retail)       [address] [place], [place]  [address]       Ph: [phone]       Fax: [phone]   RxID:   [id number] LISINOPRIL 2.5 MG TABS (LISINOPRIL) One tablet by mouth daily for blood pressure  #30 x 5   Entered and Authorized by:   Lehman Prom FNP   Signed by:   Lehman Prom FNP on 08/04/2010   Method used:   Faxed to ...       [facility] - Pharmac (retail)        [address] [place], [place]  [address]       Ph: [phone]  Fax: [phone]   RxID:   [id number] SIMVASTATIN 40 MG TABS (SIMVASTATIN) One tablet by mouth nightly for cholesterol  #30 x 5   Entered and Authorized by:   Lehman Prom FNP   Signed by:   Lehman Prom FNP on 08/04/2010   Method used:   Faxed to ...       [facility] - Pharmac (retail)       [address] [place], [place]  [address]       Ph: [phone]       Fax: [phone]   RxID:   [id number] PLAVIX 75 MG TABS (CLOPIDOGREL BISULFATE) 1 tablet by mouth for circulation  #30 x 5   Entered and Authorized by:   Lehman Prom FNP   Signed by:   Lehman Prom FNP on 08/04/2010   Method used:   Faxed to ...       [facility] - Pharmac (retail)       [address] [place], [place]  [address]       Ph: [phone]       Fax: [phone]   RxID:   [id number] METOPROLOL TARTRATE 25 MG TABS (METOPROLOL TARTRATE) One tablet by mouth two times a day  #60 x 5   Entered and Authorized by:   Lehman Prom FNP   Signed by:   Lehman Prom FNP on 08/04/2010   Method used:   Faxed to ...       [facility] - Pharmac (retail)       [address] [place], [place]  [address]       Ph: [phone]       Fax: [phone]   RxID:   [id number]   Laboratory Results   Urine Tests  Date/Time Received: August 04, 2010 11:57 AM   Routine Urinalysis   Color: yellow Appearance: Clear Glucose: negative   (Normal Range: Negative) Bilirubin: small   (Normal Range: Negative) Ketone: negative   (Normal Range: Negative) Spec. Gravity: >=1.030   (Normal Range: 1.003-1.035) Blood: negative   (Normal Range: Negative) pH: 5.5   (Normal Range: 5.0-8.0) Protein: trace   (Normal Range: Negative) Urobilinogen: 0.2   (Normal Range: 0-1) Nitrite: negative   (Normal Range:  Negative) Leukocyte Esterace: negative   (Normal Range: Negative)    Date/Time Received: August 04, 2010 3:39 PM   Other Tests  Rapid HIV: negative    Laboratory Results   Urine Tests    Routine Urinalysis   Color: yellow Appearance: Clear Glucose: negative   (Normal Range: Negative) Bilirubin: small   (Normal Range: Negative) Ketone: negative   (Normal Range: Negative) Spec. Gravity: >=1.030   (Normal Range: 1.003-1.035) Blood: negative   (Normal Range: Negative) pH: 5.5   (Normal Range: 5.0-8.0) Protein: trace   (Normal Range: Negative) Urobilinogen: 0.2   (Normal Range: 0-1) Nitrite: negative   (Normal Range: Negative) Leukocyte Esterace: negative   (Normal Range: Negative)      Other Tests  Rapid HIV: negative

## 2010-12-17 ENCOUNTER — Encounter (HOSPITAL_COMMUNITY): Payer: Self-pay | Attending: Cardiovascular Disease

## 2010-12-17 DIAGNOSIS — Z5189 Encounter for other specified aftercare: Secondary | ICD-10-CM | POA: Insufficient documentation

## 2010-12-17 DIAGNOSIS — Z7902 Long term (current) use of antithrombotics/antiplatelets: Secondary | ICD-10-CM | POA: Insufficient documentation

## 2010-12-17 DIAGNOSIS — Z87891 Personal history of nicotine dependence: Secondary | ICD-10-CM | POA: Insufficient documentation

## 2010-12-17 DIAGNOSIS — Z9861 Coronary angioplasty status: Secondary | ICD-10-CM | POA: Insufficient documentation

## 2010-12-17 DIAGNOSIS — I200 Unstable angina: Secondary | ICD-10-CM | POA: Insufficient documentation

## 2010-12-17 DIAGNOSIS — Z7982 Long term (current) use of aspirin: Secondary | ICD-10-CM | POA: Insufficient documentation

## 2010-12-17 DIAGNOSIS — I10 Essential (primary) hypertension: Secondary | ICD-10-CM | POA: Insufficient documentation

## 2010-12-17 DIAGNOSIS — I2510 Atherosclerotic heart disease of native coronary artery without angina pectoris: Secondary | ICD-10-CM | POA: Insufficient documentation

## 2010-12-18 NOTE — Letter (Signed)
Summary: [facility]  [facility]   Imported By: Arta Bruce 10/28/2010 16:36:37  _____________________________________________________________________  External Attachment:    Type:   Image     Comment:   External Document

## 2010-12-19 ENCOUNTER — Encounter (HOSPITAL_COMMUNITY): Payer: Self-pay

## 2010-12-22 ENCOUNTER — Encounter (HOSPITAL_COMMUNITY): Payer: Self-pay

## 2010-12-24 ENCOUNTER — Encounter (HOSPITAL_COMMUNITY): Payer: Self-pay

## 2010-12-26 ENCOUNTER — Encounter (HOSPITAL_COMMUNITY): Payer: Self-pay

## 2010-12-29 ENCOUNTER — Encounter (HOSPITAL_COMMUNITY): Payer: Self-pay

## 2010-12-31 ENCOUNTER — Encounter (HOSPITAL_COMMUNITY): Payer: Self-pay

## 2011-01-02 ENCOUNTER — Encounter (HOSPITAL_COMMUNITY): Payer: Self-pay

## 2011-01-05 ENCOUNTER — Encounter (HOSPITAL_COMMUNITY): Payer: Self-pay

## 2011-01-07 ENCOUNTER — Encounter (HOSPITAL_COMMUNITY): Payer: Self-pay

## 2011-01-09 ENCOUNTER — Encounter (HOSPITAL_COMMUNITY): Payer: Self-pay

## 2011-01-12 ENCOUNTER — Encounter (HOSPITAL_COMMUNITY): Payer: Self-pay

## 2011-01-14 ENCOUNTER — Encounter (HOSPITAL_COMMUNITY): Payer: Self-pay

## 2011-01-16 ENCOUNTER — Encounter (HOSPITAL_COMMUNITY): Payer: Self-pay | Attending: Cardiovascular Disease

## 2011-01-16 DIAGNOSIS — Z7982 Long term (current) use of aspirin: Secondary | ICD-10-CM | POA: Insufficient documentation

## 2011-01-16 DIAGNOSIS — Z9861 Coronary angioplasty status: Secondary | ICD-10-CM | POA: Insufficient documentation

## 2011-01-16 DIAGNOSIS — I10 Essential (primary) hypertension: Secondary | ICD-10-CM | POA: Insufficient documentation

## 2011-01-16 DIAGNOSIS — I200 Unstable angina: Secondary | ICD-10-CM | POA: Insufficient documentation

## 2011-01-16 DIAGNOSIS — Z5189 Encounter for other specified aftercare: Secondary | ICD-10-CM | POA: Insufficient documentation

## 2011-01-16 DIAGNOSIS — Z7902 Long term (current) use of antithrombotics/antiplatelets: Secondary | ICD-10-CM | POA: Insufficient documentation

## 2011-01-16 DIAGNOSIS — Z87891 Personal history of nicotine dependence: Secondary | ICD-10-CM | POA: Insufficient documentation

## 2011-01-16 DIAGNOSIS — I2510 Atherosclerotic heart disease of native coronary artery without angina pectoris: Secondary | ICD-10-CM | POA: Insufficient documentation

## 2011-01-19 ENCOUNTER — Encounter (HOSPITAL_COMMUNITY): Payer: Self-pay

## 2011-01-21 ENCOUNTER — Encounter (HOSPITAL_COMMUNITY): Payer: Self-pay

## 2011-01-23 ENCOUNTER — Encounter (HOSPITAL_COMMUNITY): Payer: Self-pay

## 2011-01-23 ENCOUNTER — Encounter: Payer: Self-pay | Admitting: Nurse Practitioner

## 2011-01-23 ENCOUNTER — Encounter (INDEPENDENT_AMBULATORY_CARE_PROVIDER_SITE_OTHER): Payer: Self-pay | Admitting: Nurse Practitioner

## 2011-01-23 DIAGNOSIS — K5900 Constipation, unspecified: Secondary | ICD-10-CM | POA: Insufficient documentation

## 2011-01-26 ENCOUNTER — Encounter (HOSPITAL_COMMUNITY): Payer: Self-pay

## 2011-01-27 LAB — POCT I-STAT 4, (NA,K, GLUC, HGB,HCT)
Glucose, Bld: 105 mg/dL — ABNORMAL HIGH (ref 70–99)
HCT: 26 % — ABNORMAL LOW (ref 39.0–52.0)
HCT: 30 % — ABNORMAL LOW (ref 39.0–52.0)
HCT: 40 % (ref 39.0–52.0)
Hemoglobin: 10.2 g/dL — ABNORMAL LOW (ref 13.0–17.0)
Hemoglobin: 12.2 g/dL — ABNORMAL LOW (ref 13.0–17.0)
Hemoglobin: 8.8 g/dL — ABNORMAL LOW (ref 13.0–17.0)
Potassium: 3.5 mEq/L (ref 3.5–5.1)
Potassium: 4 mEq/L (ref 3.5–5.1)
Potassium: 4.1 mEq/L (ref 3.5–5.1)
Sodium: 131 mEq/L — ABNORMAL LOW (ref 135–145)
Sodium: 132 mEq/L — ABNORMAL LOW (ref 135–145)
Sodium: 138 mEq/L (ref 135–145)
Sodium: 140 mEq/L (ref 135–145)

## 2011-01-27 LAB — CBC
HCT: 23.6 % — ABNORMAL LOW (ref 39.0–52.0)
HCT: 27.3 % — ABNORMAL LOW (ref 39.0–52.0)
Hemoglobin: 12.1 g/dL — ABNORMAL LOW (ref 13.0–17.0)
Hemoglobin: 8 g/dL — ABNORMAL LOW (ref 13.0–17.0)
Hemoglobin: 8.5 g/dL — ABNORMAL LOW (ref 13.0–17.0)
Hemoglobin: 9.2 g/dL — ABNORMAL LOW (ref 13.0–17.0)
MCH: 28.6 pg (ref 26.0–34.0)
MCH: 29.4 pg (ref 26.0–34.0)
MCH: 29.4 pg (ref 26.0–34.0)
MCH: 29.5 pg (ref 26.0–34.0)
MCHC: 33.5 g/dL (ref 30.0–36.0)
MCHC: 33.7 g/dL (ref 30.0–36.0)
MCHC: 33.8 g/dL (ref 30.0–36.0)
MCHC: 34 g/dL (ref 30.0–36.0)
MCV: 86.5 fL (ref 78.0–100.0)
MCV: 87.4 fL (ref 78.0–100.0)
MCV: 87.9 fL (ref 78.0–100.0)
Platelets: 128 10*3/uL — ABNORMAL LOW (ref 150–400)
Platelets: 135 10*3/uL — ABNORMAL LOW (ref 150–400)
Platelets: 142 10*3/uL — ABNORMAL LOW (ref 150–400)
RBC: 3.13 MIL/uL — ABNORMAL LOW (ref 4.22–5.81)
RBC: 4.23 MIL/uL (ref 4.22–5.81)
RDW: 17 % — ABNORMAL HIGH (ref 11.5–15.5)
RDW: 17.3 % — ABNORMAL HIGH (ref 11.5–15.5)
WBC: 4.8 10*3/uL (ref 4.0–10.5)
WBC: 7.6 10*3/uL (ref 4.0–10.5)
WBC: 8 10*3/uL (ref 4.0–10.5)
WBC: 8 10*3/uL (ref 4.0–10.5)

## 2011-01-27 LAB — COMPREHENSIVE METABOLIC PANEL
AST: 27 U/L (ref 0–37)
Alkaline Phosphatase: 64 U/L (ref 39–117)
CO2: 25 mEq/L (ref 19–32)
Chloride: 101 mEq/L (ref 96–112)
Creatinine, Ser: 0.86 mg/dL (ref 0.4–1.5)
GFR calc Af Amer: >60 mL/min (ref >60)
GFR calc non Af Amer: >60 mL/min (ref >60)
Potassium: 4 mEq/L (ref 3.5–5.1)
Total Bilirubin: 0.4 mg/dL (ref 0.3–1.2)

## 2011-01-27 LAB — BASIC METABOLIC PANEL
CO2: 26 mEq/L (ref 19–32)
CO2: 27 mEq/L (ref 19–32)
CO2: 28 mEq/L (ref 19–32)
Calcium: 8.6 mg/dL (ref 8.4–10.5)
Calcium: 8.7 mg/dL (ref 8.4–10.5)
Calcium: 9.1 mg/dL (ref 8.4–10.5)
Chloride: 98 mEq/L (ref 96–112)
Creatinine, Ser: 0.91 mg/dL (ref 0.4–1.5)
Creatinine, Ser: 0.96 mg/dL (ref 0.4–1.5)
Creatinine, Ser: 1.05 mg/dL (ref 0.4–1.5)
Glucose, Bld: 112 mg/dL — ABNORMAL HIGH (ref 70–99)
Glucose, Bld: 118 mg/dL — ABNORMAL HIGH (ref 70–99)
Glucose, Bld: 142 mg/dL — ABNORMAL HIGH (ref 70–99)
Sodium: 134 mEq/L — ABNORMAL LOW (ref 135–145)
Sodium: 140 mEq/L (ref 135–145)

## 2011-01-27 LAB — CREATININE, SERUM
Creatinine, Ser: 0.89 mg/dL (ref 0.4–1.5)
Creatinine, Ser: 0.93 mg/dL (ref 0.4–1.5)
GFR calc Af Amer: >60 mL/min (ref >60)
GFR calc non Af Amer: >60 mL/min (ref >60)
GFR calc non Af Amer: >60 mL/min (ref >60)

## 2011-01-27 LAB — POCT I-STAT, CHEM 8
Calcium, Ion: 1.18 mmol/L (ref 1.12–1.32)
Calcium, Ion: 1.27 mmol/L (ref 1.12–1.32)
Creatinine, Ser: 1 mg/dL (ref 0.4–1.5)
Glucose, Bld: 154 mg/dL — ABNORMAL HIGH (ref 70–99)
Glucose, Bld: 160 mg/dL — ABNORMAL HIGH (ref 70–99)
HCT: 25 % — ABNORMAL LOW (ref 39.0–52.0)
Hemoglobin: 8.5 g/dL — ABNORMAL LOW (ref 13.0–17.0)
Hemoglobin: 9.2 g/dL — ABNORMAL LOW (ref 13.0–17.0)
Potassium: 3.9 mEq/L (ref 3.5–5.1)
TCO2: 24 mmol/L (ref 0–100)
TCO2: 26 mmol/L (ref 0–100)

## 2011-01-27 LAB — HEMOGLOBIN AND HEMATOCRIT, BLOOD: HCT: 26.5 % — ABNORMAL LOW (ref 39.0–52.0)

## 2011-01-27 LAB — URINALYSIS, ROUTINE W REFLEX MICROSCOPIC
Glucose, UA: NEGATIVE mg/dL
Ketones, ur: NEGATIVE mg/dL
Nitrite: NEGATIVE
Specific Gravity, Urine: 1.018 (ref 1.005–1.030)
pH: 7 (ref 5.0–8.0)

## 2011-01-27 LAB — TYPE AND SCREEN
ABO/RH(D): O POS
Antibody Screen: NEGATIVE

## 2011-01-27 LAB — POCT I-STAT 3, ART BLOOD GAS (G3+)
Acid-Base Excess: 1 mmol/L (ref 0.0–2.0)
Acid-base deficit: 1 mmol/L (ref 0.0–2.0)
Bicarbonate: 22.6 mEq/L (ref 20.0–24.0)
Bicarbonate: 24 mEq/L (ref 20.0–24.0)
Bicarbonate: 24.9 mEq/L — ABNORMAL HIGH (ref 20.0–24.0)
O2 Saturation: 100 %
O2 Saturation: 92 %
O2 Saturation: 98 %
O2 Saturation: 99 %
Patient temperature: 34.6
Patient temperature: 37.3
TCO2: 24 mmol/L (ref 0–100)
TCO2: 25 mmol/L (ref 0–100)
TCO2: 26 mmol/L (ref 0–100)
TCO2: 26 mmol/L (ref 0–100)
pCO2 arterial: 28.9 mmHg — ABNORMAL LOW (ref 35.0–45.0)
pCO2 arterial: 42.7 mmHg (ref 35.0–45.0)
pCO2 arterial: 47.9 mmHg — ABNORMAL HIGH (ref 35.0–45.0)
pH, Arterial: 7.326 — ABNORMAL LOW (ref 7.350–7.450)
pH, Arterial: 7.392 (ref 7.350–7.450)
pO2, Arterial: 240 mmHg — ABNORMAL HIGH (ref 80.0–100.0)
pO2, Arterial: 66 mmHg — ABNORMAL LOW (ref 80.0–100.0)

## 2011-01-27 LAB — PROTIME-INR
INR: 1.44 (ref 0.00–1.49)
Prothrombin Time: 13.3 seconds (ref 11.6–15.2)
Prothrombin Time: 17.7 seconds — ABNORMAL HIGH (ref 11.6–15.2)

## 2011-01-27 LAB — POCT I-STAT 3, VENOUS BLOOD GAS (G3P V)
Acid-Base Excess: 1 mmol/L (ref 0.0–2.0)
Bicarbonate: 25.9 mEq/L — ABNORMAL HIGH (ref 20.0–24.0)
Patient temperature: 30.5
TCO2: 27 mmol/L (ref 0–100)
pH, Ven: 7.494 — ABNORMAL HIGH (ref 7.250–7.300)
pO2, Ven: 23 mmHg — CL (ref 30.0–45.0)

## 2011-01-27 LAB — URINE MICROSCOPIC-ADD ON

## 2011-01-27 LAB — PLATELET COUNT: Platelets: 177 10*3/uL (ref 150–400)

## 2011-01-27 LAB — GLUCOSE, CAPILLARY
Glucose-Capillary: 102 mg/dL — ABNORMAL HIGH (ref 70–99)
Glucose-Capillary: 111 mg/dL — ABNORMAL HIGH (ref 70–99)
Glucose-Capillary: 118 mg/dL — ABNORMAL HIGH (ref 70–99)
Glucose-Capillary: 131 mg/dL — ABNORMAL HIGH (ref 70–99)
Glucose-Capillary: 142 mg/dL — ABNORMAL HIGH (ref 70–99)

## 2011-01-27 LAB — BLOOD GAS, ARTERIAL
Acid-Base Excess: 3 mmol/L — ABNORMAL HIGH (ref 0.0–2.0)
Bicarbonate: 26.7 mEq/L — ABNORMAL HIGH (ref 20.0–24.0)
O2 Saturation: 98.2 %
Patient temperature: 98.6
TCO2: 28 mmol/L (ref 0–100)
pO2, Arterial: 105 mmHg — ABNORMAL HIGH (ref 80.0–100.0)

## 2011-01-27 LAB — APTT: aPTT: 33 seconds (ref 24–37)

## 2011-01-27 LAB — MAGNESIUM: Magnesium: 2.4 mg/dL (ref 1.5–2.5)

## 2011-01-27 NOTE — Assessment & Plan Note (Signed)
Summary: Constipation   Vital Signs:  Patient profile:   55 year old male Weight:      208.8 pounds BMI:     30.07 Temp:     98.1 degrees F oral Pulse rate:   69 / minute Pulse rhythm:   regular Resp:     16 per minute BP sitting:   107 / 82  (left arm) Cuff size:   large  Vitals Entered By: Levon Hedger (January 23, 2011 11:21 AM)  Nutrition Counseling: Patient's BMI is greater than 25 and therefore counseled on weight management options. CC: hemrrhoid pain, Hypertension Management, Lipid Management, Abdominal Pain Is Patient Diabetic? No Pain Assessment Patient in pain? yes     Location: rectum  Does patient need assistance? Functional Status Self care Ambulation Normal Comments pt brought medication list.   CC:  hemrrhoid pain, Hypertension Management, Lipid Management, and Abdominal Pain.  History of Present Illness:  Pt into the office with c/o hemorrhoids Pt reports that he was constipated for 3 weeks.  He was prescribed some medication but it was not effective. Pt required hospitalization. Pt presents with a list of his medications today into the office Admits that he does not have a high fiber diet  Dyspepsia History:      There is a prior history of GERD.  The patient does not have a prior history of documented ulcer disease.    Hypertension History:      He denies headache, chest pain, and palpitations.  He notes no problems with any antihypertensive medication side effects.        Positive major cardiovascular risk factors include male age 51 years old or older, hyperlipidemia, and hypertension.  Negative major cardiovascular risk factors include no history of diabetes, negative family history for ischemic heart disease, and non-tobacco-user status.        Positive history for target organ damage include ASHD (either angina/prior MI/prior CABG) and peripheral vascular disease.  Further assessment for target organ damage reveals no history of cardiac  end-organ damage (CHF/LVH), stroke/TIA, renal insufficiency, or hypertensive retinopathy.    Lipid Management History:      Positive NCEP/ATP III risk factors include male age 46 years old or older, hypertension, ASHD (either angina/prior MI/prior CABG), and peripheral vascular disease.  Negative NCEP/ATP III risk factors include non-diabetic, no family history for ischemic heart disease, non-tobacco-user status, no prior stroke/TIA, and no history of aortic aneurysm.        The patient states that he does not know about the "Therapeutic Lifestyle Change" diet.  He expresses no side effects from his lipid-lowering medication.  The patient denies any symptoms to suggest myopathy or liver disease.       Habits & Providers  Alcohol-Tobacco-Diet     Alcohol drinks/day: 1     Tobacco Status: quit > 6 months     Tobacco Counseling: to remain off tobacco products     Year Quit: 11/2008  Exercise-Depression-Behavior     Does Patient Exercise: yes     Exercise Counseling: to improve exercise regimen     Type of exercise: walking     Depression Counseling: not indicated; screening negative for depression     Drug Use: past     Seat Belt Use: 100     Sun Exposure: occasionally  Allergies (verified): No Known Drug Allergies  Social History: Drug Use:  past  Review of Systems CV:  Denies chest pain or discomfort. Resp:  Denies cough. GI:  Complains of constipation.  Physical Exam  General:  alert.   Head:  normocephalic and male-pattern balding.   Lungs:  normal breath sounds.   Heart:  normal rate and regular rhythm.   Rectal:  defer per pt request Neurologic:  alert & oriented X3.   Skin:  color normal.   Psych:  Oriented X3.     Impression & Recommendations:  Problem # 1:  CONSTIPATION (ICD-564.00)  advised pt to eat high fiber diet will start miralax daily His updated medication list for this problem includes:    Miralax Powd (Polyethylene glycol 3350) ..... Mix 17gm in  8oz of water or juice daily  Problem # 2:  ESSENTIAL HYPERTENSION, BENIGN (ICD-401.1)  His updated medication list for this problem includes:    Metoprolol Tartrate 25 Mg Tabs (Metoprolol tartrate) ..... One tablet by mouth two times a day    Lisinopril 2.5 Mg Tabs (Lisinopril) ..... One tablet by mouth daily for blood pressure  Problem # 3:  HEMORRHOIDS (ICD-455.6) handout given  Complete Medication List: 1)  Multivitamins Tabs (Multiple vitamin) .... Take one tablet daily 2)  Metoprolol Tartrate 25 Mg Tabs (Metoprolol tartrate) .... One tablet by mouth two times a day 3)  Simvastatin 40 Mg Tabs (Simvastatin) .... One tablet by mouth nightly for cholesterol 4)  Lisinopril 2.5 Mg Tabs (Lisinopril) .... One tablet by mouth daily for blood pressure 5)  Protonix 40 Mg Tbec (Pantoprazole sodium) .... One tablet by mouth two times a day for stomach 6)  Aspir-low 81 Mg Tbec (Aspirin) .... One tablet by mouth daily 7)  Zetia 10 Mg Tabs (Ezetimibe) .... One tablet by mouth daily for cholesterol **rx by cardiology - dr. Allyson Sabal** 8)  Fish Oil 1000 Mg Caps (Omega-3 fatty acids) .... One capsule by mouth daily 9)  Miralax Powd (Polyethylene glycol 3350) .... Mix 17gm in 8oz of water or juice daily 10)  Anusol-hc 2.5 % Crea (Hydrocortisone) .... Apply to hemorrhoids two times a day as needed  Hypertension Assessment/Plan:      The patient's hypertensive risk group is category C: Target organ damage and/or diabetes.  Today's blood pressure is 107/82.  His blood pressure goal is < 140/90.  Lipid Assessment/Plan:      Based on NCEP/ATP III, the patient's risk factor category is "history of coronary disease, peripheral vascular disease, cerebrovascular disease, or aortic aneurysm".  The patient's lipid goals are as follows: Total cholesterol goal is 200; LDL cholesterol goal is 100; HDL cholesterol goal is 40; Triglyceride goal is 150.    Patient Instructions: 1)  You need to eat more foods with  fiber. 2)  Eat raisins, raisin bran, apples with peeling, oatmeal, drink prune 3)  You will need to take miralax daily - mix with water or juice daily. 4)  Apply anusol to the hemorrhoids.  Remember the hemorrhoids are worse the more you are constipated 5)  Prescriptions have been sent to the pharmacy Prescriptions: ANUSOL-HC 2.5 % CREA (HYDROCORTISONE) Apply to hemorrhoids two times a day as needed  #30gm x 1   Entered and Authorized by:   Lehman Prom FNP   Signed by:   Lehman Prom FNP on 01/23/2011   Method used:   Faxed to ...       [facility] - Pharmac (retail)       [address] [place], [place]  [address]       Ph: [phone]  Fax: [phone]   RxID:   [id number] MIRALAX  POWD (POLYETHYLENE GLYCOL 3350) Mix 17gm in 8oz of water or juice daily  #1 month qs x 5   Entered and Authorized by:   Lehman Prom FNP   Signed by:   Lehman Prom FNP on 01/23/2011   Method used:   Faxed to ...       [facility] - Pharmac (retail)       [address] [place], [place]  [address]       Ph: [phone]       Fax: [phone]   RxID:   [id number]    Orders Added: 1)  Est. Patient Level III [[address]]

## 2011-01-27 NOTE — Letter (Signed)
Summary: Handout Printed  Printed Handout:  - Diet - High-Fiber 

## 2011-01-27 NOTE — Letter (Signed)
Summary: Handout Printed  Printed Handout:  - Hemorrhoids 

## 2011-01-28 ENCOUNTER — Encounter (HOSPITAL_COMMUNITY): Payer: Self-pay

## 2011-01-28 LAB — BASIC METABOLIC PANEL
BUN: 12 mg/dL (ref 6–23)
BUN: 13 mg/dL (ref 6–23)
CO2: 27 mEq/L (ref 19–32)
Calcium: 9.4 mg/dL (ref 8.4–10.5)
Chloride: 107 mEq/L (ref 96–112)
Creatinine, Ser: 0.87 mg/dL (ref 0.4–1.5)
Creatinine, Ser: 1.04 mg/dL (ref 0.4–1.5)
GFR calc Af Amer: >60 mL/min (ref >60)
GFR calc non Af Amer: >60 mL/min (ref >60)
Glucose, Bld: 115 mg/dL — ABNORMAL HIGH (ref 70–99)
Potassium: 4.7 mEq/L (ref 3.5–5.1)

## 2011-01-28 LAB — DIFFERENTIAL
Basophils Absolute: 0 10*3/uL (ref 0.0–0.1)
Eosinophils Absolute: 0.2 10*3/uL (ref 0.0–0.7)
Eosinophils Relative: 4 % (ref 0–5)
Lymphocytes Relative: 41 % (ref 12–46)
Lymphs Abs: 2.2 10*3/uL (ref 0.7–4.0)
Monocytes Absolute: 0.4 10*3/uL (ref 0.1–1.0)

## 2011-01-28 LAB — LIPID PANEL
Cholesterol: 182 mg/dL (ref 0–200)
LDL Cholesterol: 99 mg/dL (ref 0–99)

## 2011-01-28 LAB — CK TOTAL AND CKMB (NOT AT ARMC)
CK, MB: 5.8 ng/mL — ABNORMAL HIGH (ref 0.3–4.0)
Relative Index: 1.7 (ref 0.0–2.5)

## 2011-01-28 LAB — CBC
HCT: 38.7 % — ABNORMAL LOW (ref 39.0–52.0)
MCH: 28.7 pg (ref 26.0–34.0)
MCHC: 32.8 g/dL (ref 30.0–36.0)
MCHC: 32.8 g/dL (ref 30.0–36.0)
MCHC: 33.4 g/dL (ref 30.0–36.0)
MCV: 87.4 fL (ref 78.0–100.0)
Platelets: 276 10*3/uL (ref 150–400)
Platelets: 323 10*3/uL (ref 150–400)
RBC: 4.22 MIL/uL (ref 4.22–5.81)
RDW: 16.9 % — ABNORMAL HIGH (ref 11.5–15.5)
RDW: 17.7 % — ABNORMAL HIGH (ref 11.5–15.5)
RDW: 18 % — ABNORMAL HIGH (ref 11.5–15.5)
WBC: 5.3 10*3/uL (ref 4.0–10.5)
WBC: 5.4 10*3/uL (ref 4.0–10.5)

## 2011-01-28 LAB — URINALYSIS, ROUTINE W REFLEX MICROSCOPIC
Bilirubin Urine: NEGATIVE
Ketones, ur: NEGATIVE mg/dL
Nitrite: NEGATIVE
Protein, ur: NEGATIVE mg/dL
Urobilinogen, UA: 0.2 mg/dL (ref 0.0–1.0)
pH: 6 (ref 5.0–8.0)

## 2011-01-28 LAB — CARDIAC PANEL(CRET KIN+CKTOT+MB+TROPI)
CK, MB: 4.9 ng/mL — ABNORMAL HIGH (ref 0.3–4.0)
Relative Index: 1.5 (ref 0.0–2.5)
Troponin I: 0.02 ng/mL (ref 0.00–0.06)

## 2011-01-28 LAB — TROPONIN I: Troponin I: 0.02 ng/mL (ref 0.00–0.06)

## 2011-01-28 LAB — HEPARIN LEVEL (UNFRACTIONATED): Heparin Unfractionated: 0.26 IU/mL — ABNORMAL LOW (ref 0.30–0.70)

## 2011-01-30 ENCOUNTER — Encounter (HOSPITAL_COMMUNITY): Payer: Self-pay

## 2011-02-02 ENCOUNTER — Encounter (HOSPITAL_COMMUNITY): Payer: Self-pay

## 2011-02-04 ENCOUNTER — Encounter (HOSPITAL_COMMUNITY): Payer: Self-pay

## 2011-02-04 LAB — COMPREHENSIVE METABOLIC PANEL
ALT: 24 U/L (ref 0–53)
Albumin: 4.7 g/dL (ref 3.5–5.2)
Alkaline Phosphatase: 82 U/L (ref 39–117)
BUN: 7 mg/dL (ref 6–23)
BUN: 8 mg/dL (ref 6–23)
CO2: 28 mEq/L (ref 19–32)
CO2: 29 mEq/L (ref 19–32)
Calcium: 9.9 mg/dL (ref 8.4–10.5)
Chloride: 98 mEq/L (ref 96–112)
Creatinine, Ser: 0.82 mg/dL (ref 0.4–1.5)
GFR calc non Af Amer: >60 mL/min (ref >60)
GFR calc non Af Amer: >60 mL/min (ref >60)
Glucose, Bld: 108 mg/dL — ABNORMAL HIGH (ref 70–99)
Potassium: 4 mEq/L (ref 3.5–5.1)
Total Bilirubin: 0.7 mg/dL (ref 0.3–1.2)

## 2011-02-04 LAB — DIFFERENTIAL
Basophils Absolute: 0.1 10*3/uL (ref 0.0–0.1)
Basophils Relative: 2 % — ABNORMAL HIGH (ref 0–1)
Eosinophils Relative: 2 % (ref 0–5)
Eosinophils Relative: 3 % (ref 0–5)
Lymphocytes Relative: 60 % — ABNORMAL HIGH (ref 12–46)
Monocytes Absolute: 0.3 10*3/uL (ref 0.1–1.0)
Monocytes Absolute: 0.4 10*3/uL (ref 0.1–1.0)
Monocytes Relative: 7 % (ref 3–12)
Neutro Abs: 1.2 10*3/uL — ABNORMAL LOW (ref 1.7–7.7)
Neutrophils Relative %: 30 % — ABNORMAL LOW (ref 43–77)

## 2011-02-04 LAB — CARDIAC PANEL(CRET KIN+CKTOT+MB+TROPI)
CK, MB: 5.5 ng/mL — ABNORMAL HIGH (ref 0.3–4.0)
CK, MB: 5.7 ng/mL — ABNORMAL HIGH (ref 0.3–4.0)
Relative Index: 1.9 (ref 0.0–2.5)
Troponin I: 0.06 ng/mL (ref 0.00–0.06)

## 2011-02-04 LAB — LIPID PANEL
HDL: 30 mg/dL — ABNORMAL LOW (ref >39)
LDL Cholesterol: 127 mg/dL — ABNORMAL HIGH (ref 0–99)
Total CHOL/HDL Ratio: 6 RATIO
Triglycerides: 234 mg/dL — ABNORMAL HIGH (ref <150)
VLDL: 47 mg/dL — ABNORMAL HIGH (ref 0–40)

## 2011-02-04 LAB — POCT CARDIAC MARKERS
CKMB, poc: 2.1 ng/mL (ref 1.0–8.0)
Myoglobin, poc: 79.5 ng/mL (ref 12–200)

## 2011-02-04 LAB — CBC
HCT: 33.7 % — ABNORMAL LOW (ref 39.0–52.0)
HCT: 35 % — ABNORMAL LOW (ref 39.0–52.0)
HCT: 35.7 % — ABNORMAL LOW (ref 39.0–52.0)
HCT: 38.6 % — ABNORMAL LOW (ref 39.0–52.0)
HCT: 39.8 % (ref 39.0–52.0)
HCT: 40.9 % (ref 39.0–52.0)
Hemoglobin: 11.4 g/dL — ABNORMAL LOW (ref 13.0–17.0)
Hemoglobin: 11.8 g/dL — ABNORMAL LOW (ref 13.0–17.0)
Hemoglobin: 13.2 g/dL (ref 13.0–17.0)
Hemoglobin: 13.6 g/dL (ref 13.0–17.0)
MCHC: 33.1 g/dL (ref 30.0–36.0)
MCHC: 33.2 g/dL (ref 30.0–36.0)
MCHC: 33.4 g/dL (ref 30.0–36.0)
MCHC: 33.7 g/dL (ref 30.0–36.0)
MCV: 91.3 fL (ref 78.0–100.0)
MCV: 91.5 fL (ref 78.0–100.0)
MCV: 92.2 fL (ref 78.0–100.0)
MCV: 92.6 fL (ref 78.0–100.0)
MCV: 92.8 fL (ref 78.0–100.0)
MCV: 94.1 fL (ref 78.0–100.0)
Platelets: 305 10*3/uL (ref 150–400)
Platelets: 311 10*3/uL (ref 150–400)
Platelets: 351 10*3/uL (ref 150–400)
RBC: 3.66 MIL/uL — ABNORMAL LOW (ref 4.22–5.81)
RBC: 4.22 MIL/uL (ref 4.22–5.81)
RBC: 4.48 MIL/uL (ref 4.22–5.81)
RDW: 14.2 % (ref 11.5–15.5)
RDW: 14.5 % (ref 11.5–15.5)
WBC: 5.1 10*3/uL (ref 4.0–10.5)
WBC: 5.3 10*3/uL (ref 4.0–10.5)
WBC: 5.7 10*3/uL (ref 4.0–10.5)

## 2011-02-04 LAB — BASIC METABOLIC PANEL
BUN: 8 mg/dL (ref 6–23)
BUN: 9 mg/dL (ref 6–23)
CO2: 28 mEq/L (ref 19–32)
Calcium: 9.4 mg/dL (ref 8.4–10.5)
Chloride: 102 mEq/L (ref 96–112)
Chloride: 105 mEq/L (ref 96–112)
Chloride: 99 mEq/L (ref 96–112)
Creatinine, Ser: 0.9 mg/dL (ref 0.4–1.5)
Creatinine, Ser: 0.9 mg/dL (ref 0.4–1.5)
GFR calc Af Amer: >60 mL/min (ref >60)
GFR calc Af Amer: >60 mL/min (ref >60)
GFR calc non Af Amer: >60 mL/min (ref >60)
GFR calc non Af Amer: >60 mL/min (ref >60)
Glucose, Bld: 110 mg/dL — ABNORMAL HIGH (ref 70–99)
Glucose, Bld: 136 mg/dL — ABNORMAL HIGH (ref 70–99)
Potassium: 3.6 mEq/L (ref 3.5–5.1)
Potassium: 3.9 mEq/L (ref 3.5–5.1)
Potassium: 3.9 mEq/L (ref 3.5–5.1)
Sodium: 134 mEq/L — ABNORMAL LOW (ref 135–145)
Sodium: 143 mEq/L (ref 135–145)

## 2011-02-04 LAB — MRSA PCR SCREENING: MRSA by PCR: NEGATIVE

## 2011-02-04 LAB — PROTIME-INR: Prothrombin Time: 12.9 seconds (ref 11.6–15.2)

## 2011-02-04 LAB — CK TOTAL AND CKMB (NOT AT ARMC)
CK, MB: 22.4 ng/mL (ref 0.3–4.0)
CK, MB: 3.1 ng/mL (ref 0.3–4.0)
CK, MB: 3.2 ng/mL (ref 0.3–4.0)
Relative Index: 2 (ref 0.0–2.5)
Relative Index: 8.1 — ABNORMAL HIGH (ref 0.0–2.5)
Total CK: 219 U/L (ref 7–232)
Total CK: 276 U/L — ABNORMAL HIGH (ref 7–232)

## 2011-02-04 LAB — APTT: aPTT: 29 seconds (ref 24–37)

## 2011-02-04 LAB — HEPARIN LEVEL (UNFRACTIONATED)
Heparin Unfractionated: 0.31 IU/mL (ref 0.30–0.70)
Heparin Unfractionated: 0.4 IU/mL (ref 0.30–0.70)
Heparin Unfractionated: 0.7 IU/mL (ref 0.30–0.70)

## 2011-02-04 LAB — MAGNESIUM: Magnesium: 2.2 mg/dL (ref 1.5–2.5)

## 2011-02-04 LAB — TSH: TSH: 1.14 u[IU]/mL (ref 0.350–4.500)

## 2011-02-04 LAB — LIPASE, BLOOD: Lipase: 26 U/L (ref 11–59)

## 2011-02-06 ENCOUNTER — Encounter (HOSPITAL_COMMUNITY): Payer: Self-pay

## 2011-02-08 LAB — CBC
Hemoglobin: 11.7 g/dL — ABNORMAL LOW (ref 13.0–17.0)
MCHC: 33.5 g/dL (ref 30.0–36.0)
Platelets: 320 10*3/uL (ref 150–400)
RDW: 14.6 % (ref 11.5–15.5)

## 2011-02-08 LAB — POCT I-STAT, CHEM 8
Hemoglobin: 12.9 g/dL — ABNORMAL LOW (ref 13.0–17.0)
Sodium: 137 mEq/L (ref 135–145)
TCO2: 30 mmol/L (ref 0–100)

## 2011-02-08 LAB — PROTIME-INR: INR: 1.09 (ref 0.00–1.49)

## 2011-02-08 LAB — BASIC METABOLIC PANEL
Calcium: 9.4 mg/dL (ref 8.4–10.5)
GFR calc Af Amer: >60 mL/min (ref >60)
GFR calc non Af Amer: >60 mL/min (ref >60)
Sodium: 136 mEq/L (ref 135–145)

## 2011-02-08 LAB — CARDIAC PANEL(CRET KIN+CKTOT+MB+TROPI)
CK, MB: 3.8 ng/mL (ref 0.3–4.0)
Relative Index: 1.6 (ref 0.0–2.5)
Total CK: 237 U/L — ABNORMAL HIGH (ref 7–232)

## 2011-02-09 ENCOUNTER — Encounter (HOSPITAL_COMMUNITY): Payer: Self-pay

## 2011-02-11 ENCOUNTER — Encounter (HOSPITAL_COMMUNITY): Payer: Self-pay

## 2011-02-13 ENCOUNTER — Encounter (HOSPITAL_COMMUNITY): Payer: Self-pay

## 2011-02-16 ENCOUNTER — Encounter (HOSPITAL_COMMUNITY): Payer: Self-pay

## 2011-02-18 ENCOUNTER — Encounter (HOSPITAL_COMMUNITY): Payer: Self-pay

## 2011-02-20 ENCOUNTER — Encounter (HOSPITAL_COMMUNITY): Payer: Self-pay

## 2011-02-23 ENCOUNTER — Encounter (HOSPITAL_COMMUNITY): Payer: Self-pay

## 2011-02-25 ENCOUNTER — Encounter (HOSPITAL_COMMUNITY): Payer: Self-pay

## 2011-02-27 ENCOUNTER — Encounter (HOSPITAL_COMMUNITY): Payer: Self-pay

## 2011-03-02 ENCOUNTER — Encounter (HOSPITAL_COMMUNITY): Payer: Self-pay

## 2011-03-03 LAB — BASIC METABOLIC PANEL
Calcium: 9.8 mg/dL (ref 8.4–10.5)
GFR calc Af Amer: >60 mL/min (ref >60)
GFR calc non Af Amer: >60 mL/min (ref >60)
Glucose, Bld: 120 mg/dL — ABNORMAL HIGH (ref 70–99)
Potassium: 4.2 mEq/L (ref 3.5–5.1)
Sodium: 139 mEq/L (ref 135–145)

## 2011-03-03 LAB — CBC
HCT: 36.6 % — ABNORMAL LOW (ref 39.0–52.0)
Hemoglobin: 12.7 g/dL — ABNORMAL LOW (ref 13.0–17.0)
RBC: 3.95 MIL/uL — ABNORMAL LOW (ref 4.22–5.81)
WBC: 6.6 10*3/uL (ref 4.0–10.5)

## 2011-03-04 ENCOUNTER — Encounter (HOSPITAL_COMMUNITY): Payer: Self-pay

## 2011-03-06 ENCOUNTER — Encounter (HOSPITAL_COMMUNITY): Payer: Self-pay

## 2011-03-09 ENCOUNTER — Encounter (HOSPITAL_COMMUNITY): Payer: Self-pay

## 2011-03-12 DIAGNOSIS — Z0271 Encounter for disability determination: Secondary | ICD-10-CM

## 2011-03-31 NOTE — Op Note (Signed)
NAME:  Steven Hale, Steven Hale               ACCOUNT NO.:  [id number]   MEDICAL RECORD NO.:  [id number]          PATIENT TYPE:  OBV   LOCATION:  6526                         FACILITY:  [facility]   PHYSICIAN:  James L. Malon Kindle., M.D.DATE OF BIRTH:  November 22, 1955   DATE OF PROCEDURE:  04/19/2008  DATE OF DISCHARGE:                               OPERATIVE REPORT   MEDICATIONS:  Fentanyl 50 mcg and Versed 5 mg IV.   INDICATIONS:  The patient came in with some pain and had a CT scan done  that showed a large mass in his pancreas.  There is a cystic solid  lesion present on the stomach.  He was having a lot of dyspepsia, this  is all better with proton pump inhibitor.  This procedure is done to  look for any signs if this is eroding into his stomach and to stop the  pain.   DESCRIPTION OF PROCEDURE:  Procedure was explained to the patient and  consent obtained.  In the left lateral decubitus position, the Pentax  operative scope was inserted blindly into the esophagus and advanced.  The distal esophagus was endoscopically normal.  No signs of varices,  etc.  The stomach was entered.  Immediately upon entering the stomach,  there was a large submucosal mass present on the greater curve.  The  antrum was seen well.  There was erosive gastropathy in the antrum and  erosions in the duodenum.  No ulcerations were seen.  The second  duodenum was completely normal.  The scope was withdrawn.  These initial  findings were confirmed.  There were no other findings seen from  withdrawal.   ASSESSMENT:  1. Submucosal mass, almost filling the mass in the tail of pancreas      pressing on the stomach.  2. Erosive gastropathy and duodenitis.   PLAN:  Go ahead and resume the patient's diet.  We will obtain the  surgical consultation.  I feel at this point that this need to go ahead  and be resected.  At 55 years of age, the potential if this has  malignancy is reasonably high and if not certainly has potential  for  becoming malignant.  I do not know the biopsy or anything else of this  nature would need to have surgery.  We therefore will obtain a surgical  consultation.           ______________________________  Llana Aliment Malon Kindle., M.D.     Waldron Session  D:  04/19/2008  T:  04/20/2008  Job:  [id number]   cc:   Lyn Records, M.D.  Angelia Mould. Derrell Lolling, M.D.

## 2011-03-31 NOTE — Discharge Summary (Signed)
Steven Hale, Steven Hale               ACCOUNT NO.:  [id number]   MEDICAL RECORD NO.:  [id number]          PATIENT TYPE:  INP   LOCATION:  1536                         FACILITY:  [facility]   PHYSICIAN:  Angelia Mould. Derrell Lolling, M.D.DATE OF BIRTH:  Mar 02, 1956   DATE OF ADMISSION:  05/10/2008  DATE OF DISCHARGE:  05/19/2008                               DISCHARGE SUMMARY   FINAL DIAGNOSIS:  1. Solid pseudo-papillary tumor of the pancreas.  2. Coronary artery disease, status post myocardial infarction, status      post stent placement 2005.  3. Atherosclerotic peripheral vascular disease with intermittent      claudication in the right lower extremity.  4. Tobacco abuse.  5. Hypertension.   OPERATIONS PERFORMED:  Exploratory laparotomy, distal pancreatectomy and  splenectomy; date of surgery June 25th 2009.   HISTORY:  This is a 55 year old black man with a history of coronary  artery disease.  He was admitted to [facility] on June 2 with  noncardiac chest pain.  Extensive evaluation was performed and on of the  imaging studies suggested a 55.5 cm partially cystic mass in the body and  tail of the pancreas.  He had CT scanning, MRI and PET CT, all this  showed was a large mass in the pancreatic tail, moderately  hypermetabolic, some concern for malignancy but no sign of any  metastatic disease.  There was a submucosal mass effect on the greater  curvature of stomach as seen on upper endoscopy but there was no mass in  the stomach as seen on endoscopy.  Myocardial infarction was ruled out.  CA 19-9 was less than 1.2, CEA was 2.9, Alpha-fetoprotein was 6.1.  I  saw him during his hospitalization.  He was advised to have elective  pancreatectomy and splenectomy and possible partial gastrectomy.  He  received vaccines for H. flu, pneumococcus and meningococcus in the  hospital and was discharged home.  He was evaluated further as an  outpatient and elective surgery was planned.  He is brought to  the  operating room electively.   PHYSICAL EXAM:  Pleasant middle-aged black man who appears muscular and  fit for his age.  NECK:  Reveals no adenopathy, no mass, no jugular venous distention.  LUNGS:  Clear, no wheezing or rhonchi.  HEART:  Regular rate and rhythm, no ectopy, no murmur.  Radial and  femoral pulses are palpable.  ABDOMEN:  Upper midline scar well healed from previous laparotomy for  trauma, no hernia or mass noted.   HOSPITAL COURSE:  On day of admission the patient was taken to the  operating room and underwent exploratory laparotomy through a left  subcostal incision.  I found that he had a large, smooth, firm mass in  the body and tail of pancreas.  The spleen looked normal.  I was able to  do a splenectomy and distal pancreatectomy and get a good margin of  normal pancreatic tissue to the right of the tumor.  The tumor had not  invaded the stomach and had not invaded the kidney or left renal vein.  Drains were left.  Final pathology report showed a solid pseudo-papillary tumor of the  pancreas 9.8 cm.  Margins were negative but the tumor did appear to  invade the capsule, consistent with a low grade malignancy.  The patient  was informed of this and arrangements were made for him to follow up  with Dr. Arlan Organ as an outpatient.   Postoperatively the patient did fairly well.  He had an ileus for a few  days but we were able to start a clear liquid diet on May 13, 2008.  He  had some problems with hyperglycemia which resolved.  Hemoglobin drifted  down to 8.0 and we did transfuse him because of his coronary artery  disease and he tolerated this well.  He had transient fever during the  transfusions but then no further.   He began having flatus and bowel movements and we slowly advanced his  diet, although he remained a little bit distended.  His ileus slowly  resolved.  He became more active and ambulatory.   He was ready for discharge on July 4.   At that time his appetite was  much better, his bowels had been moving.  His wound was clean.  He was  draining serosanguineous fluid from both Jackson-Pratt drains and these  were left in place.  Arrangements were made for him to follow up with me  in my office in 4-5 days for a wound and drain check.  He was instructed  on drain care.  He was given a prescription for Vicodin for pain and to  continue all of his usual medications which include aspirin,  multivitamins, omeprazole.      Angelia Mould. Derrell Lolling, M.D.  Electronically Signed     HMI/MEDQ  D:  06/12/2008  T:  06/12/2008  Job:  [id number]   cc:   Lyn Records, M.D.  Fax: [phone]   Llana Aliment. Malon Kindle., M.D.  Fax: [phone]   Lauretta I. Odogwu, M.D.  Fax: [phone]
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
## 2011-03-31 NOTE — Assessment & Plan Note (Signed)
OFFICE VISIT   Steven Hale, Steven Hale  DOB:  1955-12-10                                        October 13, 2010  CHART #:  [id number]   HISTORY:  The patient comes in today for 3-week postoperative followup.  He is status post coronary artery bypass grafting x4 on September 18, 2010.  His postoperative course was generally uncomplicated and he was  discharged home in good condition on September 22, 2010.  Since his  discharge, he has continued to progress well.  At this point, he is not  taking any of the pain medication and is only taking Tylenol or Aleve as  needed.  He is walking several times daily, and his appetite and stamina  are generally improving.  He has had some stinging and burning across  the incision site, but otherwise denies any significant pain and has had  no shortness of breath or lower extremity edema.  He saw Dr. Allyson Sabal in  the office last week and Zetia 10 mg daily was added to his medication  regimen.   PHYSICAL EXAMINATION:  Vital Signs:  Blood pressure is 109/73, pulse is  60, O2 sat 99% on room air.  Chest:  Sternal and right lower extremity  EVH incisions are all healed well.  Heart:  Regular rate and rhythm  without murmurs, rubs, or gallops.  Sternum is stable to palpation.  Lungs:  Clear.  Extremities:  Lower extremities show no significant  edema.  There is some resolving ecchymosis of the right thigh.   IMAGING:  Chest x-ray is stable with no significant effusions.   ASSESSMENT AND PLAN:  The patient is healing well status post coronary  artery bypass graft.  He does plan to enroll in cardiac rehab and I have  encouraged him to do so as well as to  continue increasing his activity as tolerated at home.  He may begin to  drive at this point.  Since he is progressing well, we will see him back  as needed in the future.   Coral Ceo, P.A.   GC/MEDQ  D:  10/13/2010  T:  10/14/2010  Job:  [id number]   cc:   Nanetta Batty, M.D.  [facility] Office.
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
## 2011-03-31 NOTE — Discharge Summary (Signed)
NAME:  Steven Hale, Steven Hale NO.:  [id number]   MEDICAL RECORD NO.:  [id number]          PATIENT TYPE:  INP   LOCATION:  6526                         FACILITY:  [facility]   PHYSICIAN:  Lyn Records, M.D.   DATE OF BIRTH:  December 27, 1955   DATE OF ADMISSION:  04/17/2008  DATE OF DISCHARGE:  04/20/2008                               DISCHARGE SUMMARY   DISCHARGE DIAGNOSES:  1. Chest pain, noncardiac.  2. Pancreatic mass, seen by Dr. Carman Ching and Dr. Claud Kelp.  3. Hypertension.  4. Known coronary artery disease, history of Prader-Willi syndrome,      January 2005.  5. Dyslipidemia.   HOSPITAL COURSE:  [name] is a 55 year old male patient with known  coronary artery disease, who is admitted to [facility] with  substernal chest pain though had atypical features.  On arrival, D-dimer  was slightly elevated at 430.  Angio of the chest was performed, which  showed no evidence of acute pulmonary embolism, but there was a large  peripherally calcified mass in the left upper quadrant just overlying  the pancreas.  A CT of the abdomen and pelvis performed revealed a 9.5  cm mixed cystic lesion in the tail of pancreas.   As a cardiac workup, the patient underwent stress test that showed no  stress-induced defect.  EF of 48%.  The patient was felt to be stable  from a cardiac standpoint, then cleared for surgery, if the need arises  for this pancreatic mass.   The patient was seen in consultation by Dr. Carman Ching of the  Gastroenterology Service.  An EGD showed no mass and also showed erosive  gastritis.  The patient was recommended to be on Omeprazole permanently.  The patient was then seen by Dr. Claud Kelp, and a PET/CT scan is to  be performed on April 24, 2008 at 8:30 a.m.  The patient is to follow up  with Dr. Derrell Lolling after this appointment.  I will contact Dr. Jacinto Halim  office and they will contact the patient regarding this appointment.  The patient is  cleared from a cardiac standpoint for surgery per Dr.  Verdis Prime.   MEDICATIONS:  The patient is to remain on the following medications:  1. Omeprazole 20 mg daily.  2. Enteric-coated aspirin 81 mg a day.  3. Multivitamin 1 tablet a day.   The patient is to stop taking Aleve.   The patient is remain on low-sodium, heart-healthy diet.  Increase  activity slowly.  Follow with Dr. Derrell Lolling, his office will call the  patient.  Follow up with Dr. Katrinka Blazing as needed as well as Dr. Randa Evens as  needed.      Guy Franco, P.A.      Lyn Records, M.D.  Electronically Signed    LB/MEDQ  D:  04/20/2008  T:  04/20/2008  Job:  [id number]   cc:   Fayrene Fearing L. Malon Kindle., M.D.  Angelia Mould. Derrell Lolling, M.D.

## 2011-03-31 NOTE — Cardiovascular Report (Signed)
NAME:  Steven Hale, Steven Hale               ACCOUNT NO.:  [id number]   MEDICAL RECORD NO.:  [id number]          PATIENT TYPE:  AMB   LOCATION:  SDS                          FACILITY:  [facility]   PHYSICIAN:  Vonna Kotyk R. Jacinto Halim, MD       DATE OF BIRTH:  April 16, 1956   DATE OF PROCEDURE:  02/18/2009  DATE OF DISCHARGE:                            CARDIAC CATHETERIZATION   PROCEDURE PERFORMED:  1. Right femoral arterial access and crossover into the left common      femoral artery, placement of catheter tip into the left common      femoral artery and left femoral arteriogram with distal runoff.  2. Stenting of the left superficial femoral artery.   INDICATIONS:  [name] is a 55 year old gentleman with a prior  history of smoking, hypertension, hyperlipidemia who has been having  severe lifestyle Fontan class III claudication of left lower extremity.  He has undergone PTA and stenting of the right SFA in February 2010.  He  had a high-grade 90% stenosis of the left SFA.  He is now brought for  elective angioplasty of the same.   INTERVENTION DATA:  Successful PTA and optional stenting secondary to  dissection of the left superficial femoral artery with implantation of a  6.0 x 80 mm Absolute Pro self-expanding stent.  Overall, the stenosis  was reduced from 80% to 0% with brisk flow noted.   The left iliac femoral arteriogram with distal runoff revealed high-  grade 80% stenosis of the left superficial femoral artery which was  stented after balloon angioplasty leg to a dissection.   Below the left, there was two-vessel runoff in the form of peroneal and  posterior tibial artery and the anterior tibial artery was occluded in  the proximal to mid segment.   RECOMMENDATIONS:  The patient has done extremely well with risk  modification.  I expect a significant improvement in symptoms of  claudication.  He will be discharged home today and I will follow him up  in 2-3 weeks.   TECHNICAL PROCEDURE:   Under usual sterile precautions using heparin for  anticoagulation having a 7-French right femoral arterial access sheath  which was a Terumo sheath which was crossed over into the left femoral  artery using a crossover 5-French catheter.  Left femoral arteriogram  with distal runoff was performed.  Then using Versacore guidewire, a 5.0  x 60 mm FoxCross balloon was utilized and angioplasty at 6 atmospheric  pressure was performed with reduction of stenosis from 80% to 0%.  However, there was a dissection flap noted.  Hence, we decided to stent  this with Absolute Pro self-expanding stent and the stent was deployed  and again post-dilated the  same balloon that was previously used with excellent results.  Angiography revealed excellent results.  The patient tolerated the  procedure well.  The guidewire was withdrawn and the sheath was pulled  back in the right femoral artery and the patient was transferred to the  recovery in a stable condition.      Cristy Hilts. Jacinto Halim, MD  Electronically Signed  JRG/MEDQ  D:  02/18/2009  T:  02/19/2009  Job:  [id number]   cc:   Dala Dock

## 2011-03-31 NOTE — Discharge Summary (Signed)
NAMEBROEDY, OSBOURNE               ACCOUNT NO.:  [id number]   MEDICAL RECORD NO.:  [id number]          PATIENT TYPE:  INP   LOCATION:  2014                         FACILITY:  [facility]   PHYSICIAN:  Cristy Hilts. Jacinto Halim, MD       DATE OF BIRTH:  Jun 20, 1956   DATE OF ADMISSION:  01/08/2009  DATE OF DISCHARGE:  01/09/2009                               DISCHARGE SUMMARY   [name] came into the hospital today for PV angiogram per Dr. Verl Dicker  recommendations.  He had claudication symptoms and also abnormal ABIs  with ABI on the right of 0.6, on the left 0.64.  He underwent PV  angiogram.  He was found to have high-grade bilateral SFA lesions.  He  had right SFA stenting with an EverFlex biliary stent 6 x 100.  He also  had a 40% right renal artery stenosis and a 50% iliac which will be  treated medically.  He has 2-vessel runoff to bilateral legs.  On the  morning of December 09, 2008, his labs were all normal.  He had walked in  the hall.  He was feeling fine and he was considered stable for  discharge home.  The plan is to have him come back in 2 weeks for right  SFA stenting.  The patient is aware of this, we will call him at home  with further directions of either seeing Dr. Jacinto Halim in the office first  or coming directly to the hospital for his procedure.   DISCHARGE MEDICATIONS:  1. Toprol-XL 25 mg a day.  2. Pletal 100 mg 2 times a day.  3. Plavix 75 mg a day.  4. Pravastatin 20 mg a day.  5. Wellbutrin 150 mg 2 times a day.  6. Prednisone 10 mg every day.  7. Lisinopril 20 mg a day.  8. Aspirin 81 mg a day.   LABORATORY DATA:  His hemoglobin was 12.7, hematocrit 36.6, WBC is 6.6,  and platelets 302.  Sodium 139, potassium 4.2, BUN 7, creatinine 1.03,  and glucose 120.   DISCHARGE DIAGNOSES:  1. Claudication and abnormal ankle-brachial indices.  2. Status post pulmonary vein angiogram showing bilateral superficial      femoral artery lesions with subsequent stenting of his right  superficial femoral artery.  In future, we will have left      superficial femoral stenting in approximately 2 weeks.  He had a      50% iliac which is treated medically, 40% renal artery stenosis and      he had just 2-vessel runoff bilaterally, which will also be treated      medically.  3. History of coronary artery disease with chronic right coronary      artery occlusion.  4. Hyperlipidemia.  5. Tobacco cessation.  He is now on Wellbutrin and has worked well for      him.  6. History of Bell palsy.  7. Hypertension.      Steven Hale, N.P.      Cristy Hilts. Jacinto Halim, MD  Electronically Signed    BB/MEDQ  D:  01/09/2009  T:  01/10/2009  Job:  [id number]
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
## 2011-03-31 NOTE — Cardiovascular Report (Signed)
Steven Hale, Steven Hale               ACCOUNT NO.:  [id number]   MEDICAL RECORD NO.:  [id number]          PATIENT TYPE:  INP   LOCATION:  2014                         FACILITY:  [facility]   PHYSICIAN:  Cristy Hilts. Jacinto Halim, MD       DATE OF BIRTH:  [birth year]   DATE OF PROCEDURE:  01/08/2009  DATE OF DISCHARGE:                            CARDIAC CATHETERIZATION   PROCEDURE PERFORMED:  1. Abdominal aortogram.  2. Pelvic aortogram.  3. Selective right iliac arteriogram.  4. Crossover from the left into the right and right femoral      arteriography with runoff.  5. Left femoral arteriography with distal runoff.  6. Percutaneous transluminal angioplasty and stenting of the right      superficial femoral artery.   INDICATIONS:  Steven Hale is a 55 year old gentleman with a known  coronary artery disease.  He also has severe peripheral arterial  disease.  His ABI is 0.6 bilaterally.  He has been complaining of  lifestyle-limiting claudication with Fontaine class III.  By Dopplers,  he had occluded right superficial femoral artery and high-grade stenosis  to a complete occlusion of the left superficial femoral artery.  He was  more symptomatic in the right leg than the left.  Given this, he was  brought to the Catheterization Lab to evaluate his peripheral anatomy.  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   Abdominal aortogram:  Abdominal aortogram revealed presence of two renal  arteries one on either side.  Right renal artery in the mid-to-distal  segment at the bifurcation had a 40-50% stenoses.  Left renal artery was  widely patent.  Aortoiliac bifurcation was widely patent.  The right  common iliac artery showed a 50% stenoses.   Right iliac artery with distal runoff.  Right iliac artery with distal  runoff revealed a completely occluded right superficial femoral artery  just at the  distal end of the Hunter canal.   Below the right knee the anterior tibial artery is occluded and there is  a high-grade 90% stenosis of the posterior tibial artery in the mid  segment.  There is two-vessel runoff noted below the left knee.   Left iliac artery with distal runoff.  Left iliac artery with distal  runoff revealed the left superficial femoral artery to have a 95% focal  stenosis at the [facility] canal.   Below the left knee similar to the right, the anterior tibial artery is  occluded and the posterior tibial artery had a focal 60% stenosis.  There is two-vessel runoff noted in the form of peroneal and posterior  tibial artery in the left lower extremity.   INTERVENTION DATA:  Successful PTA and stenting of the right superficial  femoral artery.  The right superficial femoral artery with implantation  of a 6.0 x 100 mm self-expanding EverFlex biliary stent.  The stent was  postdilated with a 5.0 x 80-mm FoxCross up to 10 atmospheric pressure  for 45 seconds.  Overall stenosis was reduced  from 100% to 0%.  The  superficial femoral artery at the inflow of the stent did have a 40%  smooth stenosis which appears to be plaque burden but excellent flow was  noted without any haziness.  Hence this lesion was left alone.   Across the right common iliac artery, a careful pullback was performed  and there was a 15-20 mm pressure gradient.  However, given the fact  that this was not high-grade and as we were proceeding with risk  modification and aggressive medical therapy, we will watch the iliac  artery stenosis unless he develops any hip claudication then he will  need angioplasty of the same.   As far as the left superficial femoral artery stenosis is concerned, it  is high grade with markedly reduced ABI and he is symptomatic.  Hence, I  am going to bring him back in a week to 2 weeks for angioplasty of his  left lower extremity.   A total of 123 mL of contrast was utilized  for diagnostic angio.   COMPLICATIONS:  Catheter tip of the self-expanding stent was dislodged  and was left in the distal segment of the stent.  However, on careful  withdrawal of the Versacore wire, the catheter tip also advanced back  and we were able to take it out of the body without any complications.   RECOMMENDATIONS:  The patient will be discharged home tomorrow with  outpatient followup.   TECHNIQUE OF THE PROCEDURE:  Under usual sterile precautions using a 5-  French left femoral arterial access, a 5-French Omniflush catheter was  advanced into the abdominal aorta and abdominal aortogram was performed.  Pelvic aortogram was also performed.  Then the same catheter was  utilized to crossover from the left into the right iliac artery and  right iliac arteriogram with distal runoff was performed.   Using heparin for anticoagulation, the Versacore wire was advanced into  the right superficial femoral artery and using a glide tip end-hole  catheter, I was able to break the inflow cap of the occlusion and I was  able to get back into the true lumen of the artery without any  complications and the Versacore wire was advanced.  The glide tip  catheter was withdrawn and rest of the procedure was performed using  predilatation with 5.0 x 80-mm FoxCross balloon followed by stenting  with a 6.0 x 100-mm EverFlex self-expanding stent.  Having performed  this, angiography revealed excellent results.  The stent was postdilated  with FoxCross balloon and the wire was gently withdrawn along with the  catheter tip.  The stent delivery catheter tip that was also dislodged  came back along with the Versacore wire.  Excellent results were noted.  Then careful pullback was performed across the right iliac artery  stenosis and we decided to do a medical therapy.   Having withdrawn the crossover sheath into the left femoral artery,  common femoral artery, left femoral arteriogram with distal  runoff was  performed.  The patient tolerated the procedure well.  ACT was  maintained at greater than 200.      Cristy Hilts. Jacinto Halim, MD  Electronically Signed     Cristy Hilts. Jacinto Halim, MD  Electronically Signed    JRG/MEDQ  D:  01/08/2009  T:  01/08/2009  Job:  [id number]

## 2011-04-03 NOTE — Cardiovascular Report (Signed)
Steven Hale, Steven Hale NO.:  [id number]   MEDICAL RECORD NO.:  [id number]                   PATIENT TYPE:  INP   LOCATION:  2903                                 FACILITY:  [facility]   PHYSICIAN:  Lesleigh Noe, M.D.            DATE OF BIRTH:  29-Nov-1955   DATE OF PROCEDURE:  04/02/2004  DATE OF DISCHARGE:                              CARDIAC CATHETERIZATION   INDICATION:  Non-Q-wave myocardial infarction 24-48 hours old.   PROCEDURE PERFORMED:  1. Left heart catheterization.  2. Selective coronary angiogram.  3. Left ventriculography.  4. Angioplasty and stent first obtuse marginal.  5. Percutaneous transluminal coronary angioplasty of totally occluded right     coronary, failed due to failure to cross with guide wire.   DESCRIPTION:  After informed consent, patient was brought to the cath lab.  A 6 French sheath was started in the right femoral artery using the modified  Seldinger technique.  A 6 French A2 multipurpose catheter was used for  hemodynamic recordings, left ventriculography by hand injection, selective  left and right coronary angiography.  The patient tolerated the diagnostic  procedure without complications.   After reviewing the cardiac catheterization images, it was felt that the  patient had a chronically occluded right coronary that was well  collateralized from the left anterior descending and circumflex.  The acute  culprit vessel was felt to be the first obtuse marginal that contained a  type A 95% stenosis.  After reviewing the angiographic data we felt that  percutaneous intervention was indicated since there was no significant LAD  disease.  We initially approached the first obtuse marginal because we felt  this was the culprit vessel.  He was already on Integrilin and was given a  bolus of 4500 units of heparin.  ACT was documented to be greater than 300  seconds.  We used a 6 [place] #4 left Judkins guide catheter and a  Building control surveyor.  We crossed the lesion in the first obtuse marginal with minimal  difficulty, predilated with a 2.5 x 12 Quantum balloon and stented with a 13  x 2.5 mm Cypher stent to 15 atmospheres x2 inflations.  A very acceptable  angiographic result was obtained.  We then turned our attention to the right  coronary where we used an Asahi medium guide wire and a Keisz right guide  catheter, KR4 6 [place].  We were able to traverse into the body of the right  coronary but could not get good distal wire position and attempts to  recannulize right coronary were terminated after approximately 15 minutes of  work.  No complications occurred during the procedure.   RESULTS:  A. Hemodynamic data:     a. Aortic pressure 107/76.     b. Left ventricular pressure 105/11 mmHg.   A. Left ventricle:  The left ventricular cavity is normal in size.  Overall  contractility is normal.  Ejection fraction is 65%.  No mitral     regurgitation is noted.   A. Coronary angiography:     a. Left main coronary artery:  Widely patent.     b. Left anterior descending coronary:  Proximal calcification is noted.        There is 30% proximal narrowing.  LAD is transapical.  This supplies        collaterals to the distal right coronary via septal perforators and        the apical portion of the LAD.  No significant obstructions are noted        in the left anterior descending coronary.     c. Circumflex artery:  Circumflex coronary artery is large/dominant.  It        gives rise into a dominant first obtuse marginal and it trifurcates on        the left lateral wall.  In the proximal segment there is 95%        obstruction.  It is felt to be the patient's culprit lesion.  The mid        and distal circumflex before two small obtuse marginal branches is        diffusely diseased with up to 60% narrowing.     d. Right coronary:  The right coronary artery is totally occluded after        the conus branch.   Plentiful collaterals noted from the left coronary        system to the right coronary and there is also marginal        collateralization of the right coronary via homocollaterals.   A. Percutaneous coronary intervention:     a. Stent of the circumflex/first obtuse marginal:  The lesion of this        first obtuse marginal was reduced from 95% to 0% with TIMI grade 3        flow following deployment of a 13 x 2.5 mm Cypher stent.     b. Angioplasty of the right coronary:  This was a failed attempt at        recannulization of a chronic total occlusion of the right coronary due        to failure to cross with a wire.   CONCLUSIONS:  1. Significant coronary atherosclerotic heart disease with total occlusion     of the proximal right coronary.  The right coronary was well     collateralized from the left coronary system.  2. Successful angioplasty and stent of the first obtuse marginal.  This is     felt to be the patient's culprit vessel for presenting symptoms.  3. Mild left anterior descending disease.  4. Normal left ventricular function.   PLAN:  1. Aspirin and Plavix for at least six months.  2. Integrilin x12 hours.  3. Track enzymes and decide about discharge pending the enzyme pattern.                                               Lesleigh Noe, M.D.    HWS/MEDQ  D:  04/02/2004  T:  04/03/2004  Job:  [id number]   cc:   Vikki Ports, M.D.  [address]  [place]  [place] [address]  Fax: [phone]
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
## 2011-04-03 NOTE — Discharge Summary (Signed)
NAME:  Steven Hale, Steven Hale NO.:  [id number]   MEDICAL RECORD NO.:  [id number]                   PATIENT TYPE:  INP   LOCATION:  2012                                 FACILITY:  [facility]   PHYSICIAN:  Lyn Records, M.D.                DATE OF BIRTH:  1956/03/02   DATE OF ADMISSION:  04/01/2004  DATE OF DISCHARGE:  04/04/2004                                 DISCHARGE SUMMARY   HISTORY OF PRESENT ILLNESS AND REASON FOR ADMISSION:  [name] is a 55-  year-old African American male who presented to [facility] ER with chest pain, no prior history of cardiac disease.  He began  experiencing chest pain at one p.m. the day before while he was at work.  He  described this as substernal burning and pressure, continued over 16 hours.  It has been associated with dyspnea, diaphoresis and nausea.  He has been  unable to relieve the chest pain.  The chest pain does not radiate.  The  patient does have significant risk factors for coronary artery disease  including tobacco abuse, hypertension, and dyslipidemia but for unknown  reasons, he has been noncompliant with his medications.  No family history  of premature cardiac disease.   SOCIAL HISTORY:  The patient is divorced, lives alone and drinks one to two  liters of vodka per week.  He did acknowledge to the admitting physician  that he had an alcohol problem, did not use illegal drugs.   Initial evaluation revealed an uncontrolled blood pressure of 147/116, pulse  of 69 and regular, respiratory rate 16.  The patient was afebrile.  Physical  examination was unremarkable.   EKGs showed no evidence of ischemia or infarction.  Initial cardiac markers  did reveal evidence of ischemia with MB of 33.8, myoglobin 85.7 and troponin  of 1.38.  The patient was admitted by Dr. Waldon Reining with the following  diagnoses.   ADMISSION DIAGNOSES:  1. Acute non-ST segment elevation myocardial infarction.  2.  Hypertension.  3. Dyslipidemia.  4. Alcohol abuse.  5. Tobacco abuse.  6. Medication noncompliance.   HOSPITAL COURSE:  ACUTE NON-ST SEGMENT ELEVATED MYOCARDIAL INFARCTION:  The  patient was admitted to the cardiac stepdown unit where serial isoenzymes  were repeated.  He was started on aspirin, heparin and Integrilin and IV  nitroglycerin, metoprolol and Lipitor.  Urine drug screen was obtained.  Fasting lipid panel was pending and smoking cessation teaching was ordered  when the patient became more stable.  Dr. Katrinka Blazing did evaluate the patient on  Apr 02, 2004, later that morning and he was currently without chest pain on  the heparin and the nitroglycerin drip.  Catheterization procedure was  discussed with the patient including risks and benefits.  The patient  underwent cardiac catheterization later that morning where LV function was  revealed to be normal with an  EF of 70%.  LAD showed ostial disease with  calcification 40% circumflex, OM1 95%, RCA 100% proximal occlusion.  The  patient subsequently underwent PCI of the OM1 with subsequent placement of  a CYPHER stent with in-flow TIMI-3 0% occlusion.  RCA remained 100% occluded  and Dr. Katrinka Blazing was unable to cross over this lesion.  He was continued on  aspirin, Plavix and Integrilin in the post catheterization period.  His  enzyme peaks were as follows:  Troponin I 2.24, CK 597, MB 18.5.  Due to the  patient's history of alcohol use and concomitant use of anticoagulative  medications, stools were checked for blood and these were negative.  He  continued to remain chest pain-free and we began work on risk factor  management including smoking cessation, blood pressure and lipid control and  medication compliance with stress per ourselves and the nursing staff.  He  was also started on cardiac rehab on Apr 03, 2004, at [facility] with plans for outpatient cardiac rehab after discharge.  On Apr 04, 2004, the  patient's blood pressure was 102/67, pulse 60, respiratory  rate 20, temperature 97.3, room air saturation was 96%.  Urine drug screen  was negative for any illegal drugs.  Subsequent lipid panel returned with a  cholesterol of 274, triglycerides of 170, HDL cholesterol 31, LDL 209.  The  patient did experience some mild groin discomfort with initial ambulation  the day before, but this has resolved.  Dr. Katrinka Blazing saw the patient that  morning and agreed that the patient was appropriate for discharge.   DISCHARGE DIAGNOSES:  1. Acute non-ST segment elevation myocardial infarction.  2. Status post CYPHER stent to first obtuse marginal.  3. Hypertension, now controlled.  4. Dyslipidemia with very elevated LDL cholesterol, suboptimal HDL, mild     elevation of triglycerides.  5. Medication compliance issues.  6. Concomitant tobacco and alcohol abuse.   DISCHARGE MEDICATIONS:  1. Lopressor 50 mg b.i.d.  2. Aspirin 325 mg daily.  3. Altace 10 mg daily.  4. Vitorin 10/80 daily at supper.  5. Plavix 75 mg daily.  6. Sublingual nitroglycerin p.r.n. chest pains with exclusive instructions     on how to use this medication written on the discharge sheet.   ADDITIONAL INSTRUCTIONS:  The patient has, again, been instructed to stop  smoking and reduce and stop alcohol intake as well.   ACTIVITY:  No driving for two weeks.  He is able to return to work after he  is evaluated by Dr. Katrinka Blazing outpatient in the office.   DIET:  Cardiac, low fat, low cholesterol, low salt, no more than 4000 mg per  day max.   WOUND CARE:  Remove dressing today, otherwise shower only the next two days.   SPECIAL INSTRUCTIONS:  No bending, stooping, straining or lifting greater  than five pounds the next two days.   FOLLOW UP:  Dr. Katrinka Blazing on Wednesday, April 23, 2004, at 10:15 a.m.  The patient  is to undergo fasting study.  Statin panel on Friday, May 16, 2004, at 7:30  a.m.     Allison L. Rennis Harding, N.P.                     Lyn Records, M.D.    ALE/MEDQ  D:  05/06/2004  T:  05/08/2004  Job:  [id number]

## 2011-05-13 ENCOUNTER — Encounter: Payer: Self-pay | Admitting: Cardiology

## 2011-05-14 ENCOUNTER — Ambulatory Visit (INDEPENDENT_AMBULATORY_CARE_PROVIDER_SITE_OTHER): Payer: Self-pay | Admitting: Cardiology

## 2011-05-14 ENCOUNTER — Telehealth: Payer: Self-pay | Admitting: Cardiology

## 2011-05-14 ENCOUNTER — Institutional Professional Consult (permissible substitution): Payer: Self-pay | Admitting: Cardiology

## 2011-05-14 ENCOUNTER — Encounter: Payer: Self-pay | Admitting: Cardiology

## 2011-05-14 VITALS — BP 97/67 | HR 57 | Ht 69.0 in | Wt 201.8 lb

## 2011-05-14 DIAGNOSIS — I2510 Atherosclerotic heart disease of native coronary artery without angina pectoris: Secondary | ICD-10-CM

## 2011-05-14 DIAGNOSIS — R079 Chest pain, unspecified: Secondary | ICD-10-CM

## 2011-05-14 DIAGNOSIS — E7800 Pure hypercholesterolemia, unspecified: Secondary | ICD-10-CM

## 2011-05-14 DIAGNOSIS — I10 Essential (primary) hypertension: Secondary | ICD-10-CM

## 2011-05-14 DIAGNOSIS — I739 Peripheral vascular disease, unspecified: Secondary | ICD-10-CM

## 2011-05-14 DIAGNOSIS — R072 Precordial pain: Secondary | ICD-10-CM | POA: Insufficient documentation

## 2011-05-14 NOTE — Assessment & Plan Note (Signed)
Continue aspirin and statin. 

## 2011-05-14 NOTE — Patient Instructions (Signed)
Your physician wants you to follow-up in: 6 MONTHS WITH DR Jens Som  You will receive a reminder letter in the mail two months in advance. If you don't receive a letter, please call our office to schedule the follow-up appointment.   Your physician has requested that you have a lower extremity arterial duplex. During this test an ultrasound is used to evaluate arterial blood flow in the legs. Allow one hour for this exam. There are no restrictions or special instructions.

## 2011-05-14 NOTE — Assessment & Plan Note (Signed)
Blood pressure controlled. Continue present medications. 

## 2011-05-14 NOTE — Assessment & Plan Note (Signed)
Continue statin. 

## 2011-05-14 NOTE — Assessment & Plan Note (Signed)
I am not convinced his lower extremity pain is related to claudication at this point. Previous interventions but last catheterization in November of 2011 showed no disease. Good pulses on examination. Plan ABIs with Doppler.

## 2011-05-14 NOTE — Telephone Encounter (Signed)
Pt rtn call 

## 2011-05-14 NOTE — Telephone Encounter (Signed)
Spoke with pt, he was wondering what to do about his arthritis and his reflux. Referred the pt back to his medical md for these issues Deliah Goody

## 2011-05-14 NOTE — Progress Notes (Signed)
HPI: 55 year old male with past medical history of coronary artery disease for establishment. Previously followed at [facility].  Patient with multiple interventions in the past. Cardiac catheterization in November of 2011 showed severe three-vessel coronary disease and he underwent coronary artery bypass and graft with a left internal mammary artery to LAD, saphenous vein graft to obtuse marginal 1, sequential saphenous vein graft to distal right coronary and obtuse marginal 2. Preoperative catheterization also revealed a 40% right renal artery stenosis but no aortoiliac disease. Patient states that he was dismissed from [facility] as he had no insurance. He has had continued chest pain since his bypass surgery. It is diffuse in nature. It increases with certain movements and is described as a soreness. There is some dyspnea on exertion but no orthopnea, PND, pedal edema or syncope. He also has bilateral lower extremity pain. This occurs after ambulating. It does not occur with ambulation.  Current Outpatient Prescriptions  Medication Sig Dispense Refill  . aspirin 325 MG tablet Take 325 mg by mouth daily. Pt has been taking 4-5 daily       . ezetimibe (ZETIA) 10 MG tablet Take 10 mg by mouth daily.        . fish oil-omega-3 fatty acids 1000 MG capsule Take 2 g by mouth daily.        [name] lisinopril (PRINIVIL,ZESTRIL) 2.5 MG tablet Take 2.5 mg by mouth daily.        . metoprolol tartrate (LOPRESSOR) 25 MG tablet Take 25 mg by mouth 2 (two) times daily.        . Multiple Vitamin (MULTIVITAMIN) tablet Take 1 tablet by mouth daily.        . pantoprazole (PROTONIX) 40 MG tablet Take 40 mg by mouth daily.        . polyethylene glycol (MIRALAX / GLYCOLAX) packet Take 17 g by mouth daily.        . simvastatin (ZOCOR) 40 MG tablet Take 40 mg by mouth at bedtime.        [name] DISCONTD: aspirin 81 MG tablet Take 81 mg by mouth daily.        [name] DISCONTD: hydrocortisone (ANUSOL-HC) 2.5 % rectal cream  Place 1 application rectally 2 (two) times daily as needed.        [name] DISCONTD: metoprolol succinate (TOPROL-XL) 25 MG 24 hr tablet Take 25 mg by mouth daily.          No Known Allergies  Past Medical History  Diagnosis Date  . Peripheral vascular disease   . Hypertension   . Hyperlipidemia   . Pancreatic cancer     surgery 2009  . Anemia, unspecified   . Esophageal reflux   . Helicobacter pylori (H. pylori) infection   . Unspecified hemorrhoids without mention of complication   . Impotence of organic origin   . Other peripheral vascular disease   . Bell's palsy   . Acute myocardial infarction, unspecified site, episode of care unspecified   . Coronary atherosclerosis of unspecified type of vessel, native or graft     Past Surgical History  Procedure Date  . Pancreatic  cancer 05/10/2008    Resection of distal panrease and spleen  . Splenectomy   . Back surgery     History   Social History  . Marital Status: Single    Spouse Name: N/A    Number of Children: 0  . Years of Education: N/A   Occupational History  .  Social History Main Topics  . Smoking status: Former Smoker    Types: Cigarettes    Quit date: 11/29/2008  . Smokeless tobacco: Never Used  . Alcohol Use: Yes     Social use  . Drug Use: No  . Sexually Active: Not on file   Other Topics Concern  . Not on file   Social History Narrative   He works at the night shift at [employer], no children    Family History  Problem Relation Age of Onset  . Heart attack Father     died of MI at age 46    ROS: Hematochezia from hemorrhoids but no fevers or chills, productive cough, hemoptysis, dysphasia, odynophagia, melena, dysuria, hematuria, rash, seizure activity, orthopnea, PND, pedal edema. Remaining systems are negative.  Physical Exam: General:  Well developed/well nourished in NAD Skin warm/dry Patient not depressed No peripheral  clubbing Back-normal HEENT-normal/normal eyelids Neck supple/normal carotid upstroke bilaterally; no bruits; no JVD; no thyromegaly chest - CTA/ normal expansion; status post sternotomy CV - RRR/normal S1 and S2; no murmurs, rubs or gallops;  PMI nondisplaced Abdomen -NT/ND, no HSM, no mass, + bowel sounds, no bruit 2+ femoral pulses, no bruits Ext-no edema, chords, 2+ DP Neuro-grossly nonfocal  ECG May 12, 2011-normal sinus rhythm with no ST changes.

## 2011-05-14 NOTE — Telephone Encounter (Signed)
Pt wants to talk to debra mathis re his meds.

## 2011-05-14 NOTE — Assessment & Plan Note (Signed)
Patient has had continuous chest pain since bypass surgery. Recent electrocardiogram negative. His pain sounds more likely to be musculoskeletal as it increases with movements. Plan followup observation for now.

## 2011-05-18 ENCOUNTER — Encounter: Payer: Self-pay | Admitting: Cardiology

## 2011-06-01 ENCOUNTER — Telehealth: Payer: Self-pay | Admitting: Cardiology

## 2011-06-01 NOTE — Telephone Encounter (Signed)
Spoke with pt, he called to report the pain and swelling in his feet and legs got worse over the weekend. The top of his feet, ankles and calves are a little more swollen than usual and he is having a lot of pain. He is aware he has dopplers this week to access the blood flow. He has a lot of vague complaints of pain and discomfort all over. Explained to pt we do not give pain meds and he would need to talk to his medical md at [facility]. Pt voiced understanding Deliah Goody

## 2011-06-01 NOTE — Telephone Encounter (Signed)
Per pt call, pain on top of both feet and back. Pt cannot walk. Medication pt is on is not working. Pt said pain in feet has recently gotten worse within the last week. Pt changed medication within the last month. Please return pt call to advise.

## 2011-06-05 ENCOUNTER — Encounter (INDEPENDENT_AMBULATORY_CARE_PROVIDER_SITE_OTHER): Payer: Self-pay | Admitting: *Deleted

## 2011-06-05 DIAGNOSIS — I739 Peripheral vascular disease, unspecified: Secondary | ICD-10-CM

## 2011-06-06 ENCOUNTER — Emergency Department (HOSPITAL_COMMUNITY)
Admission: EM | Admit: 2011-06-06 | Discharge: 2011-06-06 | Disposition: A | Discharge status: Home / Self Care | Payer: Self-pay | Attending: Emergency Medicine | Admitting: Emergency Medicine

## 2011-06-06 DIAGNOSIS — I10 Essential (primary) hypertension: Secondary | ICD-10-CM | POA: Insufficient documentation

## 2011-06-06 DIAGNOSIS — M545 Low back pain, unspecified: Secondary | ICD-10-CM | POA: Insufficient documentation

## 2011-06-06 DIAGNOSIS — M549 Dorsalgia, unspecified: Secondary | ICD-10-CM | POA: Insufficient documentation

## 2011-06-06 DIAGNOSIS — M79609 Pain in unspecified limb: Secondary | ICD-10-CM | POA: Insufficient documentation

## 2011-06-06 DIAGNOSIS — I252 Old myocardial infarction: Secondary | ICD-10-CM | POA: Insufficient documentation

## 2011-06-06 DIAGNOSIS — I2510 Atherosclerotic heart disease of native coronary artery without angina pectoris: Secondary | ICD-10-CM | POA: Insufficient documentation

## 2011-06-06 DIAGNOSIS — Z86718 Personal history of other venous thrombosis and embolism: Secondary | ICD-10-CM | POA: Insufficient documentation

## 2011-06-08 ENCOUNTER — Encounter: Payer: Self-pay | Admitting: Cardiology

## 2011-06-09 ENCOUNTER — Telehealth: Payer: Self-pay | Admitting: Cardiology

## 2011-06-09 NOTE — Telephone Encounter (Signed)
LEA's results faxed to the number provided Deliah Goody

## 2011-06-09 NOTE — Telephone Encounter (Signed)
Copy of doppler results needed to complete Medicare by Friday.  Fax copy to of doppler to [phone]

## 2011-06-10 ENCOUNTER — Telehealth: Payer: Self-pay | Admitting: Cardiology

## 2011-06-10 NOTE — Telephone Encounter (Signed)
[facility] has fowarded 2 request for records on this PT these request were faxed to [facility], got request back stating NO records available,Spoke with Marylu Lund @ [facility] asking for records to be faxed to her ASAP, I spoke with Almira Coaster she Ok'd for me to fax these records LOV from 05/14/11 & Doppler from 06/05/11 faxed to [facility] @ [facility] to fax [phone] 06/10/11/km

## 2011-08-06 ENCOUNTER — Telehealth: Payer: Self-pay | Admitting: Cardiology

## 2011-08-06 NOTE — Telephone Encounter (Signed)
Spoke with pt, he wanted to talk to dr Jens Som about some paperwork he has and while talking to the pt he c/o tight feeling in his chest. He had this tightness 2-3 times last month and then this week has had it twice. It occurred this week while he was working, he stopped and the pain went away. He does not have NTG. It starts as chest tightness and then he will start sweating and gets funny feelings in his left arm. He also c/o nausea. The pt states this feels the same as when he had problems last year and they had to do CABG. He has taken a couple aspirin and is still having the tightness. Pt advised to call 911 or go to [facility] for eval. Pt voiced understanding Deliah Goody

## 2011-08-06 NOTE — Telephone Encounter (Signed)
Pt has a question about his disability a paperwork

## 2011-08-13 LAB — POCT I-STAT, CHEM 8
BUN: 14
Calcium, Ion: 1.2
Chloride: 102
Creatinine, Ser: 1
Glucose, Bld: 102 — ABNORMAL HIGH
HCT: 43
Hemoglobin: 14.6
Potassium: 3.7
Sodium: 140
TCO2: 27

## 2011-08-13 LAB — DIFFERENTIAL
Basophils Absolute: 0
Basophils Relative: 1
Basophils Relative: 1
Eosinophils Absolute: 0.2
Eosinophils Absolute: 0.2
Eosinophils Relative: 3
Eosinophils Relative: 3
Lymphocytes Relative: 43
Lymphs Abs: 3.1
Lymphs Abs: 2.8
Monocytes Absolute: 0.7
Monocytes Absolute: 0.6
Monocytes Relative: 10
Monocytes Relative: 9
Neutro Abs: 3.2
Neutrophils Relative %: 44
Neutrophils Relative %: 42 — ABNORMAL LOW

## 2011-08-13 LAB — BASIC METABOLIC PANEL
BUN: 6
BUN: 8
CO2: 27
CO2: 29
CO2: 29
CO2: 28
Calcium: 8.5
Calcium: 8.6
Chloride: 93 — ABNORMAL LOW
Chloride: 94 — ABNORMAL LOW
Chloride: 99
Creatinine, Ser: 0.77
Creatinine, Ser: 0.81
Creatinine, Ser: 0.97
GFR calc Af Amer: >60
GFR calc Af Amer: >60
GFR calc Af Amer: >60
GFR calc non Af Amer: >60
GFR calc non Af Amer: >60
GFR calc non Af Amer: >60
Glucose, Bld: 104 — ABNORMAL HIGH
Glucose, Bld: 164 — ABNORMAL HIGH
Glucose, Bld: 96
Potassium: 3.6
Potassium: 3.9
Potassium: 4.2
Potassium: 5
Potassium: 3.8
Sodium: 130 — ABNORMAL LOW
Sodium: 130 — ABNORMAL LOW
Sodium: 133 — ABNORMAL LOW
Sodium: 137

## 2011-08-13 LAB — TYPE AND SCREEN

## 2011-08-13 LAB — URINALYSIS, ROUTINE W REFLEX MICROSCOPIC
Bilirubin Urine: NEGATIVE
Glucose, UA: NEGATIVE
Hgb urine dipstick: NEGATIVE
Ketones, ur: NEGATIVE
Protein, ur: NEGATIVE
Urobilinogen, UA: 1

## 2011-08-13 LAB — CARDIAC PANEL(CRET KIN+CKTOT+MB+TROPI)
CK, MB: 3.2
Relative Index: 0.7
Total CK: 478 — ABNORMAL HIGH
Total CK: 478 — ABNORMAL HIGH
Troponin I: <0.01

## 2011-08-13 LAB — CBC
HCT: 39.3
HCT: 23.3 — ABNORMAL LOW
HCT: 28.4 — ABNORMAL LOW
HCT: 29.4 — ABNORMAL LOW
HCT: 31.5 — ABNORMAL LOW
HCT: 26.9 — ABNORMAL LOW
Hemoglobin: 13.7
Hemoglobin: 10.5 — ABNORMAL LOW
Hemoglobin: 12.7 — ABNORMAL LOW
Hemoglobin: 8 — ABNORMAL LOW
Hemoglobin: 9.6 — ABNORMAL LOW
Hemoglobin: 9.1 — ABNORMAL LOW
MCHC: 34.9
MCHC: 33.5
MCHC: 34.2
MCHC: 34.4
MCHC: 33.7
MCV: 90.1
MCV: 89.6
MCV: 90
MCV: 90.8
Platelets: 257
Platelets: 258
RBC: 4.36
RBC: 2.6 — ABNORMAL LOW
RBC: 2.76 — ABNORMAL LOW
RBC: 3.46 — ABNORMAL LOW
RBC: 4.16 — ABNORMAL LOW
RBC: 2.96 — ABNORMAL LOW
RDW: 14.2
RDW: 13.8
RDW: 14
RDW: 14.1
WBC: 7.3
WBC: 12.4 — ABNORMAL HIGH
WBC: 5

## 2011-08-13 LAB — COMPREHENSIVE METABOLIC PANEL
ALT: 16
ALT: 21
AST: 24
AST: 23
Alkaline Phosphatase: 89
Calcium: 9.5
Calcium: 10.4
GFR calc Af Amer: >60
GFR calc Af Amer: >60
Glucose, Bld: 111 — ABNORMAL HIGH
Potassium: 3.8
Sodium: 138
Sodium: 139
Total Protein: 7.3
Total Protein: 8.1

## 2011-08-13 LAB — POCT CARDIAC MARKERS
CKMB, poc: 4.3
Myoglobin, poc: 215
Operator id: 244461
Troponin i, poc: <0.05

## 2011-08-13 LAB — CANCER ANTIGEN 19-9
CA 19-9: <1.2 — ABNORMAL LOW (ref <35.0)
CA 19-9: <1.2 — ABNORMAL LOW (ref <35.0)

## 2011-08-13 LAB — CEA: CEA: 2.9

## 2011-08-13 LAB — PROTIME-INR: INR: 0.9

## 2011-08-13 LAB — LIPID PANEL
HDL: 26 — ABNORMAL LOW
Total CHOL/HDL Ratio: 9.3
Triglycerides: 103

## 2011-08-13 LAB — TROPONIN I: Troponin I: 0.02

## 2011-08-13 LAB — APTT: aPTT: 31

## 2011-08-13 LAB — AMYLASE: Amylase: 51

## 2011-08-13 LAB — D-DIMER, QUANTITATIVE: D-Dimer, Quant: 0.64 — ABNORMAL HIGH

## 2011-08-17 LAB — CULTURE, ROUTINE-ABSCESS: Culture: NO GROWTH

## 2011-08-17 LAB — CBC
Hemoglobin: 13
MCHC: 33.3
MCV: 93.2
RBC: 4.2 — ABNORMAL LOW
RDW: 14.7

## 2011-08-17 LAB — PROTIME-INR: INR: 1.1

## 2011-08-17 LAB — ANAEROBIC CULTURE

## 2011-08-17 LAB — AMYLASE, BODY FLUID

## 2011-08-19 ENCOUNTER — Emergency Department (HOSPITAL_COMMUNITY)
Admission: EM | Admit: 2011-08-19 | Discharge: 2011-08-19 | Disposition: A | Discharge status: Home / Self Care | Payer: Self-pay | Attending: Emergency Medicine | Admitting: Emergency Medicine

## 2011-08-19 ENCOUNTER — Telehealth: Payer: Self-pay | Admitting: Cardiology

## 2011-08-19 DIAGNOSIS — I252 Old myocardial infarction: Secondary | ICD-10-CM | POA: Insufficient documentation

## 2011-08-19 DIAGNOSIS — I10 Essential (primary) hypertension: Secondary | ICD-10-CM | POA: Insufficient documentation

## 2011-08-19 DIAGNOSIS — Z86718 Personal history of other venous thrombosis and embolism: Secondary | ICD-10-CM | POA: Insufficient documentation

## 2011-08-19 DIAGNOSIS — K6289 Other specified diseases of anus and rectum: Secondary | ICD-10-CM | POA: Insufficient documentation

## 2011-08-19 DIAGNOSIS — I2510 Atherosclerotic heart disease of native coronary artery without angina pectoris: Secondary | ICD-10-CM | POA: Insufficient documentation

## 2011-08-19 DIAGNOSIS — K644 Residual hemorrhoidal skin tags: Secondary | ICD-10-CM | POA: Insufficient documentation

## 2011-08-19 NOTE — Telephone Encounter (Signed)
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

## 2011-08-20 LAB — CBC
HCT: 42.3 % (ref 39.0–52.0)
Hemoglobin: 14.2 g/dL (ref 13.0–17.0)
MCV: 93.2 fL (ref 78.0–100.0)
RBC: 4.54 MIL/uL (ref 4.22–5.81)
WBC: 4.9 10*3/uL (ref 4.0–10.5)

## 2011-08-20 LAB — DIFFERENTIAL
Eosinophils Absolute: 0.1 10*3/uL (ref 0.0–0.7)
Eosinophils Relative: 3 % (ref 0–5)
Lymphocytes Relative: 60 % — ABNORMAL HIGH (ref 12–46)
Lymphs Abs: 3 10*3/uL (ref 0.7–4.0)
Monocytes Absolute: 0.4 10*3/uL (ref 0.1–1.0)
Monocytes Relative: 9 % (ref 3–12)

## 2011-08-20 LAB — D-DIMER, QUANTITATIVE: D-Dimer, Quant: 0.28 ug/mL-FEU (ref 0.00–0.48)

## 2011-08-21 LAB — CBC
HCT: 37.6 — ABNORMAL LOW
MCHC: 34.2
MCV: 90.8
Platelets: 256
WBC: 6

## 2011-08-21 LAB — DIFFERENTIAL
Basophils Relative: 2 — ABNORMAL HIGH
Eosinophils Absolute: 0.1 — ABNORMAL LOW
Eosinophils Relative: 2
Lymphs Abs: 2.4
Neutrophils Relative %: 45

## 2011-08-21 LAB — I-STAT 8, (EC8 V) (CONVERTED LAB)
BUN: 15
Bicarbonate: 28.7 — ABNORMAL HIGH
Glucose, Bld: 101 — ABNORMAL HIGH
HCT: 42
Operator id: 288331
Sodium: 137
TCO2: 30
pCO2, Ven: 44.9 — ABNORMAL LOW

## 2011-08-21 LAB — D-DIMER, QUANTITATIVE: D-Dimer, Quant: 0.57 — ABNORMAL HIGH

## 2011-08-21 LAB — POCT CARDIAC MARKERS
CKMB, poc: 3
Myoglobin, poc: 76.6
Troponin i, poc: <0.05

## 2011-08-21 LAB — POCT I-STAT CREATININE: Creatinine, Ser: 1.1

## 2011-08-24 ENCOUNTER — Telehealth: Payer: Self-pay | Admitting: Cardiology

## 2011-08-24 MED ORDER — NITROGLYCERIN 0.4 MG SL SUBL: 0.4000 mg | SUBLINGUAL_TABLET | SUBLINGUAL | Status: DC | PRN

## 2011-08-24 NOTE — Telephone Encounter (Signed)
Pt called. Please call about his meds

## 2011-08-24 NOTE — Telephone Encounter (Signed)
Pt returning call to Debra. Please call back.  

## 2011-08-24 NOTE — Telephone Encounter (Signed)
Spoke with pt, he requested to refill his ntg at [facility]. Steven Hale

## 2011-09-04 ENCOUNTER — Encounter (INDEPENDENT_AMBULATORY_CARE_PROVIDER_SITE_OTHER): Payer: Self-pay | Admitting: Surgery

## 2011-09-08 ENCOUNTER — Encounter (INDEPENDENT_AMBULATORY_CARE_PROVIDER_SITE_OTHER): Payer: Self-pay | Admitting: Surgery

## 2011-09-08 ENCOUNTER — Ambulatory Visit (INDEPENDENT_AMBULATORY_CARE_PROVIDER_SITE_OTHER): Payer: PRIVATE HEALTH INSURANCE | Admitting: Surgery

## 2011-09-08 VITALS — BP 138/94 | HR 72 | Temp 97.8°F | Resp 16 | Ht 70.0 in | Wt 209.0 lb

## 2011-09-08 DIAGNOSIS — K644 Residual hemorrhoidal skin tags: Secondary | ICD-10-CM | POA: Insufficient documentation

## 2011-09-08 DIAGNOSIS — K648 Other hemorrhoids: Secondary | ICD-10-CM

## 2011-09-08 NOTE — Progress Notes (Signed)
Chief Complaint  Patient presents with  . Other    new pt- eval hems    HPI Steven Hale is a 55 y.o. male.   HPIThis is a pleasant gentleman referred by Dr. Philipp Deputy for evaluation of bleeding hemorrhoids. The patient has multiple comorbidities including myocardial infarction and previous pancreatic cancer back in 2009 status post resection. He's been having problems with his hemorrhoids sticking out from time to time and bleeding after bowel movements. He also has mild to moderate discomfort.  Past Medical History  Diagnosis Date  . Peripheral vascular disease   . Hypertension   . Hyperlipidemia   . Pancreatic cancer     surgery 2009  . Anemia, unspecified   . Esophageal reflux   . Helicobacter pylori (H. pylori) infection   . Unspecified hemorrhoids without mention of complication   . Impotence of organic origin   . Other peripheral vascular disease   . Bell's palsy   . Acute myocardial infarction, unspecified site, episode of care unspecified   . Coronary atherosclerosis of unspecified type of vessel, native or graft     Past Surgical History  Procedure Date  . Pancreatic  cancer 05/10/2008    Resection of distal panrease and spleen  . Splenectomy   . Back surgery   . Coronary artery bypass graft nov 2011    Family History  Problem Relation Age of Onset  . Heart attack Father     died of MI at age 5  . Cancer Father     pt unaware of what kind    Social History History  Substance Use Topics  . Smoking status: Former Smoker    Types: Cigarettes    Quit date: 11/29/2008  . Smokeless tobacco: Never Used  . Alcohol Use: Yes     Social use    No Known Allergies  Current Outpatient Prescriptions  Medication Sig Dispense Refill  . aspirin 325 MG tablet Take 325 mg by mouth daily. Pt has been taking 4-5 daily       . fish oil-omega-3 fatty acids 1000 MG capsule Take 2 g by mouth daily.        . nitroGLYCERIN (NITROSTAT) 0.4 MG SL tablet Place 1 tablet (0.4 mg  total) under the tongue every 5 (five) minutes as needed for chest pain.  25 tablet  12    Review of Systems Review of Systems  Constitutional: Negative for fever, chills and unexpected weight change.  HENT: Negative for hearing loss, congestion, sore throat, trouble swallowing and voice change.   Eyes: Negative for visual disturbance.  Respiratory: Negative for cough and wheezing.   Cardiovascular: Negative for chest pain, palpitations and leg swelling.  Gastrointestinal: Positive for blood in stool and anal bleeding. Negative for nausea, vomiting, abdominal pain, diarrhea, constipation, abdominal distention and rectal pain.  Genitourinary: Negative for hematuria and difficulty urinating.  Musculoskeletal: Negative for arthralgias.  Skin: Negative for rash and wound.  Neurological: Negative for seizures, syncope, weakness and headaches.  Hematological: Negative for adenopathy. Does not bruise/bleed easily.  Psychiatric/Behavioral: Negative for confusion.    Blood pressure 138/94, pulse 72, temperature 97.8 F (36.6 C), temperature source Temporal, resp. rate 16, height 5\' 10"  (1.778 m), weight 209 lb (94.802 kg).  Physical Exam Physical Exam  Constitutional: He is oriented to person, place, and time. He appears well-developed and well-nourished. No distress.  HENT:  Head: Normocephalic and atraumatic.  Right Ear: External ear normal.  Left Ear: External ear normal.  Nose: Nose  normal.  Mouth/Throat: Oropharynx is clear and moist.  Eyes: Conjunctivae are normal. Pupils are equal, round, and reactive to light. Left eye exhibits no discharge.  Neck: Normal range of motion. Neck supple. No tracheal deviation present. No thyromegaly present.  Cardiovascular: Normal rate, regular rhythm, normal heart sounds and intact distal pulses.   Pulmonary/Chest: Effort normal and breath sounds normal. No respiratory distress. He has no wheezes. He has no rales.  Abdominal: Soft. Bowel sounds are  normal. He exhibits no distension and no mass. There is no tenderness. There is no rebound and no guarding.       He has a well-healed midline and chevron incision without evidence of hernia  Genitourinary:       On rectal exam, there is an external hemorrhoid which is sticking out and has excoriated skin. There were multiple skin tags. Digital exam shows normal tone. Anoscopy was otherwise unremarkable.  Lymphadenopathy:    He has no cervical adenopathy.  Neurological: He is alert and oriented to person, place, and time.  Skin: Skin is warm and dry. No rash noted. No erythema.  Psychiatric: He has a normal mood and affect. His behavior is normal. Judgment and thought content normal.    Data Reviewed   Assessment    Bleeding internal and external hemorrhoids    Plan     The diagnosis to him. We discussed conservative measures including suppositories. He was to go ahead and proceed with excision of the hemorrhoids given his discomfort and bleeding. I do believe this is reasonable size of the one large internal/external hemorrhoidal column. I discussed the risks of surgery which includes but is not limited to bleeding, infection, recurrence, incontinence, and cardiopulmonary issues of general anesthesia. Likelihood of success is good. We will have to preoperative cardiac clearance.       Melyssa Signor A 09/08/2011, 11:42 AM

## 2011-09-09 NOTE — Progress Notes (Signed)
Forwarded to dr blackmon Deliah Goody

## 2011-09-16 ENCOUNTER — Telehealth: Payer: Self-pay | Admitting: Cardiology

## 2011-09-16 NOTE — Telephone Encounter (Signed)
Spoke with pt, he cont to have pain in the left leg. He states it starts in the calf and goes down to the ankle. There is no swelling, redness or streaking of the leg. He describes the discomfort as his calf tightening up when he walks and then when he rest the pain will go all the way down the leg to the ankle. This occurs on a daily basis and he takes asa at night to rest. Hre will follow up with [facility] to evaluate this left leg discomfort Deliah Goody

## 2011-09-16 NOTE — Telephone Encounter (Signed)
Pt having a lot pain in his left leg and SOB and dizziness every now and then

## 2011-09-22 ENCOUNTER — Other Ambulatory Visit (INDEPENDENT_AMBULATORY_CARE_PROVIDER_SITE_OTHER): Payer: Self-pay | Admitting: Surgery

## 2011-10-14 ENCOUNTER — Encounter (HOSPITAL_COMMUNITY): Payer: Self-pay | Admitting: Pharmacy Technician

## 2011-10-19 ENCOUNTER — Encounter (HOSPITAL_COMMUNITY): Payer: Self-pay

## 2011-10-19 ENCOUNTER — Encounter (HOSPITAL_COMMUNITY)
Admission: RE | Admit: 2011-10-19 | Discharge: 2011-10-19 | Disposition: A | Discharge status: Home / Self Care | Payer: Self-pay | Source: Ambulatory Visit | Attending: Surgery | Admitting: Surgery

## 2011-10-19 ENCOUNTER — Ambulatory Visit (HOSPITAL_COMMUNITY)
Admission: RE | Admit: 2011-10-19 | Discharge: 2011-10-19 | Disposition: A | Discharge status: Home / Self Care | Payer: Self-pay | Source: Ambulatory Visit | Attending: Anesthesiology | Admitting: Anesthesiology

## 2011-10-19 DIAGNOSIS — Z01812 Encounter for preprocedural laboratory examination: Secondary | ICD-10-CM | POA: Insufficient documentation

## 2011-10-19 DIAGNOSIS — Z01818 Encounter for other preprocedural examination: Secondary | ICD-10-CM | POA: Insufficient documentation

## 2011-10-19 HISTORY — DX: Encounter for other specified aftercare: Z51.89

## 2011-10-19 HISTORY — DX: Reserved for inherently not codable concepts without codable children: IMO0001

## 2011-10-19 LAB — BASIC METABOLIC PANEL
BUN: 14 mg/dL (ref 6–23)
Chloride: 101 mEq/L (ref 96–112)
GFR calc Af Amer: >90 mL/min (ref >90)
Glucose, Bld: 114 mg/dL — ABNORMAL HIGH (ref 70–99)
Potassium: 4.9 mEq/L (ref 3.5–5.1)

## 2011-10-19 LAB — CBC
HCT: 40.1 % (ref 39.0–52.0)
Hemoglobin: 13.3 g/dL (ref 13.0–17.0)
MCHC: 33.2 g/dL (ref 30.0–36.0)
RBC: 4.38 MIL/uL (ref 4.22–5.81)

## 2011-10-19 LAB — SURGICAL PCR SCREEN: MRSA, PCR: NEGATIVE

## 2011-10-19 NOTE — Pre-Procedure Instructions (Signed)
20 Steven Hale  10/19/2011   Your procedure is scheduled on:  10/30/2011  Report to [facility] at 9:00 a.m.   Call this number if you have problems the morning of surgery: [phone]   Remember:   Do not eat food:After Midnight.  May have clear liquids: up to 4 Hours before arrival.  Clear liquids include soda, tea, black coffee, apple or grape juice, broth.  Take these medicines the morning of surgery with A SIP OF WATER: metoprolol & protonix    Do not wear jewelry, make-up or nail polish.  Do not wear lotions, powders, or perfumes. You may wear deodorant.  Do not shave 48 hours prior to surgery.  Do not bring valuables to the hospital.  Contacts, dentures or bridgework may not be worn into surgery.  Leave suitcase in the car. After surgery it may be brought to your room.  For patients admitted to the hospital, checkout time is 11:00 AM the day of discharge.   Patients discharged the day of surgery will not be allowed to drive home.  Name and phone number of your driver: Steven Hale, [phone]  Special Instructions: CHG Shower Use Special Wash: 1/2 bottle night before surgery and 1/2 bottle morning of surgery.   Please read over the following fact sheets that you were given: Pain Booklet, Coughing and Deep Breathing, Surgical Site Infection Prevention and Anesthesia Post-op Instructions

## 2011-10-19 NOTE — Progress Notes (Signed)
Pt. Is now being followed by [facility], had previously been seen by [facility].

## 2011-10-19 NOTE — Progress Notes (Signed)
Call to [facility], requested last stress test.  Spoke with Aram Beecham.

## 2011-10-29 MED ORDER — CEFAZOLIN SODIUM-DEXTROSE 2-3 GM-% IV SOLR
2.0000 g | INTRAVENOUS | Status: AC
  Administered 2011-10-30: 2 g via INTRAVENOUS
  Filled 2011-10-29: qty 50

## 2011-10-30 ENCOUNTER — Encounter (HOSPITAL_COMMUNITY): Payer: Self-pay | Admitting: Anesthesiology

## 2011-10-30 ENCOUNTER — Ambulatory Visit (HOSPITAL_COMMUNITY): Payer: Self-pay | Admitting: Anesthesiology

## 2011-10-30 ENCOUNTER — Other Ambulatory Visit (INDEPENDENT_AMBULATORY_CARE_PROVIDER_SITE_OTHER): Payer: Self-pay | Admitting: Surgery

## 2011-10-30 ENCOUNTER — Encounter (HOSPITAL_COMMUNITY)
Admission: RE | Disposition: A | Discharge status: Home / Self Care | Payer: Self-pay | Source: Ambulatory Visit | Attending: Surgery

## 2011-10-30 ENCOUNTER — Ambulatory Visit (HOSPITAL_COMMUNITY)
Admission: RE | Admit: 2011-10-30 | Discharge: 2011-10-30 | Disposition: A | Discharge status: Home / Self Care | Payer: Self-pay | Source: Ambulatory Visit | Attending: Surgery | Admitting: Surgery

## 2011-10-30 DIAGNOSIS — K649 Unspecified hemorrhoids: Secondary | ICD-10-CM

## 2011-10-30 DIAGNOSIS — F411 Generalized anxiety disorder: Secondary | ICD-10-CM | POA: Insufficient documentation

## 2011-10-30 DIAGNOSIS — R0602 Shortness of breath: Secondary | ICD-10-CM | POA: Insufficient documentation

## 2011-10-30 DIAGNOSIS — K08109 Complete loss of teeth, unspecified cause, unspecified class: Secondary | ICD-10-CM | POA: Insufficient documentation

## 2011-10-30 DIAGNOSIS — F329 Major depressive disorder, single episode, unspecified: Secondary | ICD-10-CM | POA: Insufficient documentation

## 2011-10-30 DIAGNOSIS — I10 Essential (primary) hypertension: Secondary | ICD-10-CM | POA: Insufficient documentation

## 2011-10-30 DIAGNOSIS — F3289 Other specified depressive episodes: Secondary | ICD-10-CM | POA: Insufficient documentation

## 2011-10-30 DIAGNOSIS — I252 Old myocardial infarction: Secondary | ICD-10-CM | POA: Insufficient documentation

## 2011-10-30 DIAGNOSIS — K219 Gastro-esophageal reflux disease without esophagitis: Secondary | ICD-10-CM | POA: Insufficient documentation

## 2011-10-30 DIAGNOSIS — I2510 Atherosclerotic heart disease of native coronary artery without angina pectoris: Secondary | ICD-10-CM | POA: Insufficient documentation

## 2011-10-30 DIAGNOSIS — K644 Residual hemorrhoidal skin tags: Secondary | ICD-10-CM | POA: Insufficient documentation

## 2011-10-30 DIAGNOSIS — K648 Other hemorrhoids: Secondary | ICD-10-CM | POA: Insufficient documentation

## 2011-10-30 HISTORY — PX: HEMORRHOID SURGERY: SHX153

## 2011-10-30 SURGERY — HEMORRHOIDECTOMY
Anesthesia: General | Site: Anus | Wound class: Clean Contaminated

## 2011-10-30 MED ORDER — ONDANSETRON HCL 4 MG/2ML IJ SOLN: 4.0000 mg | Freq: Four times a day (QID) | INTRAMUSCULAR | Status: DC | PRN

## 2011-10-30 MED ORDER — HYDROCODONE-ACETAMINOPHEN 7.5-500 MG PO TABS: 1.0000 | ORAL_TABLET | ORAL | Status: DC | PRN

## 2011-10-30 MED ORDER — HYDROMORPHONE HCL PF 1 MG/ML IJ SOLN: 0.2500 mg | INTRAMUSCULAR | Status: DC | PRN

## 2011-10-30 MED ORDER — ONDANSETRON HCL 4 MG/2ML IJ SOLN: 4.0000 mg | Freq: Once | INTRAMUSCULAR | Status: DC | PRN

## 2011-10-30 MED ORDER — BUPIVACAINE LIPOSOME 1.3 % IJ SUSP
20.0000 mL | INTRAMUSCULAR | Status: AC
  Administered 2011-10-30: 20 mL
  Filled 2011-10-30: qty 20

## 2011-10-30 MED ORDER — LACTATED RINGERS IV SOLN
INTRAVENOUS | Status: DC | PRN
  Administered 2011-10-30: 09:00:00 via INTRAVENOUS

## 2011-10-30 MED ORDER — DIBUCAINE 1 % RE OINT
TOPICAL_OINTMENT | RECTAL | Status: DC | PRN
  Administered 2011-10-30: 1 via RECTAL

## 2011-10-30 MED ORDER — MIDAZOLAM HCL 5 MG/5ML IJ SOLN
INTRAMUSCULAR | Status: DC | PRN
  Administered 2011-10-30: 2 mg via INTRAVENOUS

## 2011-10-30 MED ORDER — ONDANSETRON HCL 4 MG/2ML IJ SOLN
INTRAMUSCULAR | Status: DC | PRN
  Administered 2011-10-30: 4 mg via INTRAVENOUS

## 2011-10-30 MED ORDER — METOPROLOL TARTRATE 12.5 MG HALF TABLET
ORAL_TABLET | ORAL | Status: AC
  Administered 2011-10-30: 25 mg via ORAL
  Filled 2011-10-30: qty 2

## 2011-10-30 MED ORDER — FENTANYL CITRATE 0.05 MG/ML IJ SOLN
INTRAMUSCULAR | Status: DC | PRN
  Administered 2011-10-30: 50 ug via INTRAVENOUS
  Administered 2011-10-30: 150 ug via INTRAVENOUS
  Administered 2011-10-30: 50 ug via INTRAVENOUS

## 2011-10-30 MED ORDER — LACTATED RINGERS IV SOLN: INTRAVENOUS | Status: DC

## 2011-10-30 MED ORDER — PROPOFOL 10 MG/ML IV EMUL
INTRAVENOUS | Status: DC | PRN
  Administered 2011-10-30: 200 mg via INTRAVENOUS

## 2011-10-30 MED ORDER — GLYCOPYRROLATE 0.2 MG/ML IJ SOLN
INTRAMUSCULAR | Status: DC | PRN
  Administered 2011-10-30: 0.2 mg via INTRAVENOUS

## 2011-10-30 MED ORDER — METOPROLOL TARTRATE 25 MG PO TABS
25.0000 mg | ORAL_TABLET | Freq: Once | ORAL | Status: AC
  Administered 2011-10-30: 25 mg via ORAL

## 2011-10-30 MED ORDER — KETOROLAC TROMETHAMINE 30 MG/ML IJ SOLN
INTRAMUSCULAR | Status: DC | PRN
  Administered 2011-10-30: 30 mg via INTRAVENOUS

## 2011-10-30 MED ORDER — 0.9 % SODIUM CHLORIDE (POUR BTL) OPTIME
TOPICAL | Status: DC | PRN
  Administered 2011-10-30: 1000 mL

## 2011-10-30 MED ORDER — PROMETHAZINE HCL 25 MG/ML IJ SOLN: 12.5000 mg | Freq: Four times a day (QID) | INTRAMUSCULAR | Status: DC | PRN

## 2011-10-30 MED ORDER — LIDOCAINE 5 % EX OINT: TOPICAL_OINTMENT | Freq: Three times a day (TID) | CUTANEOUS | Status: DC

## 2011-10-30 SURGICAL SUPPLY — 38 items
BLADE SURG 15 STRL LF DISP TIS (BLADE) ×1 IMPLANT
BLADE SURG 15 STRL SS (BLADE) ×2
CANISTER SUCTION 2500CC (MISCELLANEOUS) ×2 IMPLANT
CLOTH BEACON ORANGE TIMEOUT ST (SAFETY) ×2 IMPLANT
COVER SURGICAL LIGHT HANDLE (MISCELLANEOUS) ×2 IMPLANT
DECANTER SPIKE VIAL GLASS SM (MISCELLANEOUS) IMPLANT
DRAPE PROXIMA HALF (DRAPES) ×2 IMPLANT
DRSG PAD ABDOMINAL 8X10 ST (GAUZE/BANDAGES/DRESSINGS) ×2 IMPLANT
ELECT CAUTERY BLADE 6.4 (BLADE) ×2 IMPLANT
ELECT REM PT RETURN 9FT ADLT (ELECTROSURGICAL) ×2
ELECTRODE REM PT RTRN 9FT ADLT (ELECTROSURGICAL) ×1 IMPLANT
GAUZE SPONGE 4X4 16PLY XRAY LF (GAUZE/BANDAGES/DRESSINGS) ×2 IMPLANT
GLOVE BIO SURGEON STRL SZ7.5 (GLOVE) ×2 IMPLANT
GLOVE BIOGEL PI IND STRL 7.5 (GLOVE) IMPLANT
GLOVE BIOGEL PI INDICATOR 7.5 (GLOVE) ×1
GLOVE SURG SIGNA 7.5 PF LTX (GLOVE) ×2 IMPLANT
GOWN PREVENTION PLUS XLARGE (GOWN DISPOSABLE) ×2 IMPLANT
GOWN STRL NON-REIN LRG LVL3 (GOWN DISPOSABLE) ×2 IMPLANT
KIT BASIN OR (CUSTOM PROCEDURE TRAY) ×2 IMPLANT
KIT ROOM TURNOVER OR (KITS) ×2 IMPLANT
NEEDLE HYPO 25GX1X1/2 BEV (NEEDLE) ×2 IMPLANT
NS IRRIG 1000ML POUR BTL (IV SOLUTION) ×2 IMPLANT
PACK LITHOTOMY IV (CUSTOM PROCEDURE TRAY) ×2 IMPLANT
PAD ARMBOARD 7.5X6 YLW CONV (MISCELLANEOUS) ×2 IMPLANT
PENCIL BUTTON HOLSTER BLD 10FT (ELECTRODE) ×2 IMPLANT
SPONGE GAUZE 4X4 12PLY (GAUZE/BANDAGES/DRESSINGS) ×2 IMPLANT
SPONGE SURGIFOAM ABS GEL 100 (HEMOSTASIS) ×1 IMPLANT
SURGILUBE 2OZ TUBE FLIPTOP (MISCELLANEOUS) ×2 IMPLANT
SUT CHROMIC 2 0 SH (SUTURE) ×2 IMPLANT
SYR BULB 3OZ (MISCELLANEOUS) ×2 IMPLANT
SYR CONTROL 10ML LL (SYRINGE) ×2 IMPLANT
TAPE CLOTH SURG 4X10 WHT LF (GAUZE/BANDAGES/DRESSINGS) ×1 IMPLANT
TOWEL OR 17X24 6PK STRL BLUE (TOWEL DISPOSABLE) IMPLANT
TOWEL OR 17X26 10 PK STRL BLUE (TOWEL DISPOSABLE) ×2 IMPLANT
TRAY PROCTOSCOPIC FIBER OPTIC (SET/KITS/TRAYS/PACK) IMPLANT
TUBE CONNECTING 12X1/4 (SUCTIONS) ×2 IMPLANT
UNDERPAD 30X30 INCONTINENT (UNDERPADS AND DIAPERS) ×2 IMPLANT
YANKAUER SUCT BULB TIP NO VENT (SUCTIONS) ×2 IMPLANT

## 2011-10-30 NOTE — Anesthesia Preprocedure Evaluation (Addendum)
Anesthesia Evaluation  Patient identified by MRN, date of birth, ID band Patient awake    Reviewed: Allergy & Precautions, H&P , NPO status , Patient's Chart, lab work & pertinent test results, reviewed documented beta blocker date and time   Airway Mallampati: I TM Distance: >3 FB Neck ROM: full    Dental  (+) Edentulous Upper, Edentulous Lower and Poor Dentition   Pulmonary neg pulmonary ROS, shortness of breath and with exertion,          Cardiovascular Exercise Tolerance: Good hypertension, Pt. on medications and Pt. on home beta blockers + CAD, + Past MI and neg cardio ROS regular Normal CABG 11/11; occasional CP with exertion; PVD with claudication   Neuro/Psych PSYCHIATRIC DISORDERS Anxiety Depression  Neuromuscular disease Negative Neurological ROS     GI/Hepatic Neg liver ROS, GERD-  Medicated and Controlled,  Endo/Other  Negative Endocrine ROS  Renal/GU negative Renal ROS  Genitourinary negative   Musculoskeletal   Abdominal   Peds  Hematology negative hematology ROS (+)   Anesthesia Other Findings   Reproductive/Obstetrics                          Anesthesia Physical Anesthesia Plan  ASA: III  Anesthesia Plan: General and General LMA   Post-op Pain Management:    Induction: Intravenous  Airway Management Planned: LMA  Additional Equipment:   Intra-op Plan:   Post-operative Plan: Extubation in OR  Informed Consent: I have reviewed the patients History and Physical, chart, labs and discussed the procedure including the risks, benefits and alternatives for the proposed anesthesia with the patient or authorized representative who has indicated his/her understanding and acceptance.     Plan Discussed with: Anesthesiologist, CRNA and Surgeon  Anesthesia Plan Comments:         Anesthesia Quick Evaluation

## 2011-10-30 NOTE — Transfer of Care (Signed)
Immediate Anesthesia Transfer of Care Note  Patient: Steven Hale  Procedure(s) Performed:  HEMORRHOIDECTOMY  Patient Location: PACU  Anesthesia Type: General  Level of Consciousness: sedated and patient cooperative  Airway & Oxygen Therapy: Patient Spontanous Breathing and Patient connected to nasal cannula oxygen  Post-op Assessment: Report given to PACU RN and Post -op Vital signs reviewed and stable  Post vital signs: Reviewed and stable  Complications: No apparent anesthesia complications

## 2011-10-30 NOTE — Progress Notes (Signed)
Mr. Roye received instructions for home care ... Pt was asked to remain in short stay until his ride arrived... However he left walking as i was receiving and checking in a new patient .... I called Hassain  His driver... At [phone]  And  He stated the patient was with him .Marland KitchenMarland KitchenMarland Kitchen

## 2011-10-30 NOTE — Interval H&P Note (Signed)
History and Physical Interval Note:  Patient still having problems with bleeding hemorrhoids.  Exam unchanged regarding hemorrhoids.  Lungs are clear.  Heart exam is normal.  Cardiac clearance has been obtained.  10/30/2011 8:08 AM  Steven Hale  has presented today for surgery, with the diagnosis of bleeding hemorrhoids  The various methods of treatment have been discussed with the patient and family. After consideration of risks, benefits and other options for treatment, the patient has consented to  Procedure(s): HEMORRHOIDECTOMY as a surgical intervention .  The patients' history has been reviewed, patient examined, no change in status, stable for surgery.  I have reviewed the patients' chart and labs.  Questions were answered to the patient's satisfaction.     Malone Admire A

## 2011-10-30 NOTE — Preoperative (Signed)
Beta Blockers   Reason not to administer Beta Blockers:Not Applicable 

## 2011-10-30 NOTE — H&P (Signed)
HPI  Steven Hale is a 55 y.o. male.  HPIThis is a pleasant gentleman referred by Dr. Philipp Deputy for evaluation of bleeding hemorrhoids. The patient has multiple comorbidities including myocardial infarction and previous pancreatic cancer back in 2009 status post resection. He's been having problems with his hemorrhoids sticking out from time to time and bleeding after bowel movements. He also has mild to moderate discomfort.  Past Medical History   Diagnosis  Date   .  Peripheral vascular disease    .  Hypertension    .  Hyperlipidemia    .  Pancreatic cancer      surgery 2009   .  Anemia, unspecified    .  Esophageal reflux    .  Helicobacter pylori (H. pylori) infection    .  Unspecified hemorrhoids without mention of complication    .  Impotence of organic origin    .  Other peripheral vascular disease    .  Bell's palsy    .  Acute myocardial infarction, unspecified site, episode of care unspecified    .  Coronary atherosclerosis of unspecified type of vessel, native or graft     Past Surgical History   Procedure  Date   .  Pancreatic cancer  05/10/2008     Resection of distal panrease and spleen   .  Splenectomy    .  Back surgery    .  Coronary artery bypass graft  nov 2011    Family History   Problem  Relation  Age of Onset   .  Heart attack  Father       died of MI at age 45    .  Cancer  Father       pt unaware of what kind    Social History  History   Substance Use Topics   .  Smoking status:  Former Smoker     Types:  Cigarettes     Quit date:  11/29/2008   .  Smokeless tobacco:  Never Used   .  Alcohol Use:  Yes      Social use    No Known Allergies  Current Outpatient Prescriptions   Medication  Sig  Dispense  Refill   .  aspirin 325 MG tablet  Take 325 mg by mouth daily. Pt has been taking 4-5 daily     .  fish oil-omega-3 fatty acids 1000 MG capsule  Take 2 g by mouth daily.     .  nitroGLYCERIN (NITROSTAT) 0.4 MG SL tablet  Place 1 tablet (0.4 mg  total) under the tongue every 5 (five) minutes as needed for chest pain.  25 tablet  12    Review of Systems  Review of Systems  Constitutional: Negative for fever, chills and unexpected weight change.  HENT: Negative for hearing loss, congestion, sore throat, trouble swallowing and voice change.  Eyes: Negative for visual disturbance.  Respiratory: Negative for cough and wheezing.  Cardiovascular: Negative for chest pain, palpitations and leg swelling.  Gastrointestinal: Positive for blood in stool and anal bleeding. Negative for nausea, vomiting, abdominal pain, diarrhea, constipation, abdominal distention and rectal pain.  Genitourinary: Negative for hematuria and difficulty urinating.  Musculoskeletal: Negative for arthralgias.  Skin: Negative for rash and wound.  Neurological: Negative for seizures, syncope, weakness and headaches.  Hematological: Negative for adenopathy. Does not bruise/bleed easily.  Psychiatric/Behavioral: Negative for confusion.   Blood pressure 138/94, pulse 72, temperature 97.8 F (36.6 C), temperature source Temporal, resp.  rate 16, height 5\' 10"  (1.778 m), weight 209 lb (94.802 kg).  Physical Exam  Physical Exam  Constitutional: He is oriented to person, place, and time. He appears well-developed and well-nourished. No distress.  HENT:  Head: Normocephalic and atraumatic.  Right Ear: External ear normal.  Left Ear: External ear normal.  Nose: Nose normal.  Mouth/Throat: Oropharynx is clear and moist.  Eyes: Conjunctivae are normal. Pupils are equal, round, and reactive to light. Left eye exhibits no discharge.  Neck: Normal range of motion. Neck supple. No tracheal deviation present. No thyromegaly present.  Cardiovascular: Normal rate, regular rhythm, normal heart sounds and intact distal pulses.  Pulmonary/Chest: Effort normal and breath sounds normal. No respiratory distress. He has no wheezes. He has no rales.  Abdominal: Soft. Bowel sounds are  normal. He exhibits no distension and no mass. There is no tenderness. There is no rebound and no guarding.  He has a well-healed midline and chevron incision without evidence of hernia  Genitourinary:  On rectal exam, there is an external hemorrhoid which is sticking out and has excoriated skin. There were multiple skin tags. Digital exam shows normal tone. Anoscopy was otherwise unremarkable.  Lymphadenopathy:  He has no cervical adenopathy.  Neurological: He is alert and oriented to person, place, and time.  Skin: Skin is warm and dry. No rash noted. No erythema.  Psychiatric: He has a normal mood and affect. His behavior is normal. Judgment and thought content normal.   Data Reviewed  Assessment   Bleeding internal and external hemorrhoids   Plan   The diagnosis to him. We discussed conservative measures including suppositories. He was to go ahead and proceed with excision of the hemorrhoids given his discomfort and bleeding. I do believe this is reasonable size of the one large internal/external hemorrhoidal column. I discussed the risks of surgery which includes but is not limited to bleeding, infection, recurrence, incontinence, and cardiopulmonary issues of general anesthesia. Likelihood of success is good. We will have to preoperative cardiac clearance.   Lylia Karn A

## 2011-10-30 NOTE — Progress Notes (Signed)
Pt admitted with Rt nare trumpet and oral airway in place.

## 2011-10-30 NOTE — Progress Notes (Signed)
Oral airway d/c'd per pt.  o2 sats remain above 98% on 6 L sm. Will monitor.

## 2011-10-30 NOTE — Progress Notes (Signed)
Rt nare nasal trumpet d/c'd intact.  o2 sats remain 96% will monitor

## 2011-10-30 NOTE — Op Note (Signed)
10/30/2011  9:54 AM  PATIENT:  Steven Hale  55 y.o. male  PRE-OPERATIVE DIAGNOSIS:  Bleeding hemorrhoids  POST-OPERATIVE DIAGNOSIS:  Bleeding hemorrhoids  PROCEDURE:  Procedure(s):  TWO COLUMN INTERNAL AND EXTERNAL HEMORRHOIDECTOMY  SURGEON:  Surgeon(s): Shelly Rubenstein, MD  PHYSICIAN ASSISTANT:   ASSISTANTS: none   ANESTHESIA:   local and general  EBL:     BLOOD ADMINISTERED:none  DRAINS: none   LOCAL MEDICATIONS USED:  BUPIVICAINE 20CC  SPECIMEN:  hemorrhoids  DISPOSITION OF SPECIMEN:  PATHOLOGY  COUNTS:  YES  TOURNIQUET:  * No tourniquets in log *  DICTATION: .Dragon Dictation  Procedure in detail: The patient was brought to the operating room and identified as the correct patient. He was placed supine on the operating room table and general anesthesia was induced. The patient was then placed in the lithotomy position. His perianal area was then prepped and draped in the usual sterile fashion. I inserted a retractor into the anal canal and circumferential inspection of the anus. The patient had 2 large internal and external hemorrhoidal columns. I placed 2 separate chromic sutures proximal to each of the hemorrhoidal columns. I then excised both columns in their entirety with the cautery. I then closed the mucosal defect with running interlocked 2-0 chromic sutures. Hemostasis at both columns appeared to be achieved. Both columns were sent to pathology for evaluation. This included both the internal and external hemorrhoids. No other intra-anal pathology was identified. I placed a piece of Gelfoam into the anal canal to aid with hemostasis. I then anesthetized the entire area circumferentially with bupivacaine. The patient tolerated the procedure well. All the counts were correct at the end of the procedure. The patient was then extubated in the operating room and taken in a stable condition to the recovery room. PLAN OF CARE: Discharge to home after PACU  PATIENT  DISPOSITION:  PACU - hemodynamically stable.   Delay start of Pharmacological VTE agent (>24hrs) due to surgical blood loss or risk of bleeding:  {YES/NO/NOT APPLICABLE:20182

## 2011-10-30 NOTE — Anesthesia Postprocedure Evaluation (Signed)
  Anesthesia Post-op Note  Patient: Steven Hale  Procedure(s) Performed:  HEMORRHOIDECTOMY  Patient Location: PACU  Anesthesia Type: General  Level of Consciousness: awake, alert , oriented, sedated and patient cooperative  Airway and Oxygen Therapy: Patient Spontanous Breathing and Patient connected to nasal cannula oxygen  Post-op Pain: mild  Post-op Assessment: Post-op Vital signs reviewed, Patient's Cardiovascular Status Stable, Respiratory Function Stable, Patent Airway, No signs of Nausea or vomiting and Pain level controlled  Post-op Vital Signs: stable  Complications: No apparent anesthesia complications

## 2011-10-30 NOTE — Anesthesia Procedure Notes (Signed)
Procedure Name: LMA Insertion Date/Time: 10/30/2011 9:17 AM Performed by: Leona Singleton A. Oxygen Delivery Method: Circle System Utilized Preoxygenation: Pre-oxygenation with 100% oxygen Intubation Type: IV induction LMA: LMA inserted LMA Size: 4.0 Tube type: Oral Number of attempts: 1 Placement Confirmation: positive ETCO2 and breath sounds checked- equal and bilateral Tube secured with: Tape Dental Injury: Teeth and Oropharynx as per pre-operative assessment

## 2011-11-03 ENCOUNTER — Telehealth (INDEPENDENT_AMBULATORY_CARE_PROVIDER_SITE_OTHER): Payer: Self-pay | Admitting: General Surgery

## 2011-11-03 ENCOUNTER — Emergency Department (HOSPITAL_COMMUNITY)
Admission: EM | Admit: 2011-11-03 | Discharge: 2011-11-03 | Discharge status: Against Medical Advice | Payer: Self-pay | Attending: Emergency Medicine | Admitting: Emergency Medicine

## 2011-11-03 ENCOUNTER — Encounter (HOSPITAL_COMMUNITY): Payer: Self-pay | Admitting: Surgery

## 2011-11-03 DIAGNOSIS — R109 Unspecified abdominal pain: Secondary | ICD-10-CM | POA: Insufficient documentation

## 2011-11-03 DIAGNOSIS — K6289 Other specified diseases of anus and rectum: Secondary | ICD-10-CM | POA: Insufficient documentation

## 2011-11-03 NOTE — Telephone Encounter (Signed)
Follow Up Call to [name] -- Left message for patient to call our office.  Reviewed patient chart with Dr. Johna Sheriff (Dr. Magnus Ivan off after call) patient to stop generic laxative and replace with Miralax, follow package directions,if constipation continues, double Miralax dosage until BM occurs, drink plenty of fluids as well.

## 2011-11-03 NOTE — ED Notes (Signed)
Pt states "my belly & rectum is hurting, I had hemorrhoid surgery last wk, last BM was 2.5 days ago, have used stool softener & ex-lax"

## 2011-11-03 NOTE — Telephone Encounter (Signed)
Patient had a hemorrhoidectomy by Dr Magnus Ivan on 10/30/11. He is calling today stating he has not had a bowel movement since last Tuesday 10/27/11. He states he has taken a stool softener everyday and has taken an off brand laxative and ex lax with no results. Dr Magnus Ivan off after call. Please advise next step.

## 2011-11-04 ENCOUNTER — Emergency Department (HOSPITAL_COMMUNITY)
Admission: EM | Admit: 2011-11-04 | Discharge: 2011-11-04 | Disposition: A | Discharge status: Home / Self Care | Payer: Self-pay | Attending: Emergency Medicine | Admitting: Emergency Medicine

## 2011-11-04 ENCOUNTER — Telehealth (INDEPENDENT_AMBULATORY_CARE_PROVIDER_SITE_OTHER): Payer: Self-pay | Admitting: General Surgery

## 2011-11-04 ENCOUNTER — Encounter (HOSPITAL_COMMUNITY): Payer: Self-pay | Admitting: Emergency Medicine

## 2011-11-04 ENCOUNTER — Encounter (HOSPITAL_COMMUNITY): Payer: Self-pay | Admitting: *Deleted

## 2011-11-04 DIAGNOSIS — I252 Old myocardial infarction: Secondary | ICD-10-CM | POA: Insufficient documentation

## 2011-11-04 DIAGNOSIS — Z8509 Personal history of malignant neoplasm of other digestive organs: Secondary | ICD-10-CM | POA: Insufficient documentation

## 2011-11-04 DIAGNOSIS — R142 Eructation: Secondary | ICD-10-CM | POA: Insufficient documentation

## 2011-11-04 DIAGNOSIS — I10 Essential (primary) hypertension: Secondary | ICD-10-CM | POA: Insufficient documentation

## 2011-11-04 DIAGNOSIS — I2510 Atherosclerotic heart disease of native coronary artery without angina pectoris: Secondary | ICD-10-CM | POA: Insufficient documentation

## 2011-11-04 DIAGNOSIS — K649 Unspecified hemorrhoids: Secondary | ICD-10-CM | POA: Insufficient documentation

## 2011-11-04 DIAGNOSIS — K6289 Other specified diseases of anus and rectum: Secondary | ICD-10-CM | POA: Insufficient documentation

## 2011-11-04 DIAGNOSIS — Z9889 Other specified postprocedural states: Secondary | ICD-10-CM | POA: Insufficient documentation

## 2011-11-04 DIAGNOSIS — R141 Gas pain: Secondary | ICD-10-CM | POA: Insufficient documentation

## 2011-11-04 DIAGNOSIS — K5900 Constipation, unspecified: Secondary | ICD-10-CM | POA: Insufficient documentation

## 2011-11-04 DIAGNOSIS — I25810 Atherosclerosis of coronary artery bypass graft(s) without angina pectoris: Secondary | ICD-10-CM | POA: Insufficient documentation

## 2011-11-04 DIAGNOSIS — R143 Flatulence: Secondary | ICD-10-CM | POA: Insufficient documentation

## 2011-11-04 MED ORDER — HYDROMORPHONE HCL PF 2 MG/ML IJ SOLN
2.0000 mg | Freq: Once | INTRAMUSCULAR | Status: AC
  Administered 2011-11-04: 2 mg via INTRAMUSCULAR
  Filled 2011-11-04: qty 1

## 2011-11-04 MED ORDER — HYDROCORTISONE ACE-PRAMOXINE 1-1 % RE FOAM: 1.0000 | Freq: Two times a day (BID) | RECTAL | Status: DC

## 2011-11-04 MED ORDER — ONDANSETRON 8 MG PO TBDP
8.0000 mg | ORAL_TABLET | Freq: Once | ORAL | Status: AC
  Administered 2011-11-04: 8 mg via ORAL
  Filled 2011-11-04: qty 1

## 2011-11-04 MED ORDER — LIDOCAINE HCL 2 % EX GEL
Freq: Once | CUTANEOUS | Status: AC
  Administered 2011-11-04: 05:00:00 via TOPICAL
  Filled 2011-11-04: qty 10

## 2011-11-04 NOTE — ED Notes (Signed)
Pt in c/o constipation, states last normal BM was last Friday, states he had surgery and has been taking pain medication

## 2011-11-04 NOTE — ED Provider Notes (Signed)
History     CSN: [id number] Arrival date & time: 11/04/2011  1:18 AM   First MD Initiated Contact with Patient 11/04/11 [phone]      Chief Complaint  Patient presents with  . Constipation    (Consider location/radiation/quality/duration/timing/severity/associated sxs/prior treatment) Patient is a 55 y.o. male presenting with constipation. The history is provided by the patient.  Constipation  The current episode started 5 to 7 days ago. The onset was gradual. The problem occurs continuously. The problem has been unchanged. Pain severity now: Rectal pain after surgery on Friday which is persistent burning and stinging. Stool description: No stool. There was no prior successful therapy. Prior unsuccessful therapies include stool softeners and laxatives. Associated symptoms include rectal pain. Pertinent negatives include no fever, no abdominal pain, no diarrhea, no nausea, no vomiting, no chest pain and no difficulty breathing. He has been eating and drinking normally. Urine output has been normal. Past medical history comments: Had hemorrhoid surgery 4 days ago. There were no sick contacts. He has received no recent medical care.    Past Medical History  Diagnosis Date  . Peripheral vascular disease   . Hypertension   . Hyperlipidemia   . Pancreatic cancer     surgery 2009  . Anemia, unspecified   . Esophageal reflux   . Helicobacter pylori (H. pylori) infection   . Unspecified hemorrhoids without mention of complication   . Impotence of organic origin   . Other peripheral vascular disease   . Bell's palsy   . Acute myocardial infarction, unspecified site, episode of care unspecified   . Coronary atherosclerosis of unspecified type of vessel, native or graft   . Shortness of breath   . Blood transfusion     during treatment for Ca  . Neuromuscular disorder     h/o bells palsy  . Arthritis     low back & all over     Past Surgical History  Procedure Date  . Pancreatic   cancer 05/10/2008    Resection of distal panrease and spleen  . Splenectomy   . Coronary artery bypass graft nov 2011  . Back surgery     1992  . Cardiac catheterization   . Hemorrhoid surgery 10/30/2011    Procedure: HEMORRHOIDECTOMY;  Surgeon: Shelly Rubenstein, MD;  Location: [facility] OR;  Service: General;  Laterality: N/A;    Family History  Problem Relation Age of Onset  . Heart attack Father     died of MI at age 39  . Cancer Father     pt unaware of what kind  . Anesthesia problems Neg Hx   . Hypotension Neg Hx   . Malignant hyperthermia Neg Hx   . Pseudochol deficiency Neg Hx     History  Substance Use Topics  . Smoking status: Former Smoker    Types: Cigarettes    Quit date: 11/29/2008  . Smokeless tobacco: Never Used  . Alcohol Use: Yes     Social use/ once per month      Review of Systems  Constitutional: Negative for fever.  Cardiovascular: Negative for chest pain.  Gastrointestinal: Positive for constipation and rectal pain. Negative for nausea, vomiting, abdominal pain and diarrhea.  All other systems reviewed and are negative.    Allergies  Review of patient's allergies indicates no known allergies.  Home Medications   Current Outpatient Rx  Name Route Sig Dispense Refill  . ACETAMINOPHEN 500 MG PO TABS Oral Take 500 mg by mouth every 6 (six)  hours as needed. Pain     . ASPIRIN EC 81 MG PO TBEC Oral Take 81 mg by mouth daily.      [name] DOCUSATE SODIUM 100 MG PO CAPS Oral Take 100 mg by mouth 2 (two) times daily as needed. constipation     . EZETIMIBE 10 MG PO TABS Oral Take 10 mg by mouth daily.      . OMEGA-3 FATTY ACIDS 1000 MG PO CAPS Oral Take 1 g by mouth daily.     [name] HYDROCODONE-ACETAMINOPHEN 7.5-500 MG PO TABS Oral Take 1 tablet by mouth every 4 (four) hours as needed for pain. 30 tablet 1    7.5/325 instead  . LIDOCAINE 5 % EX OINT Topical Apply topically 3 (three) times daily. To perianal area for pain 35.44 g 1  . LISINOPRIL 5 MG PO TABS Oral  Take 5 mg by mouth daily.      [name] METOPROLOL TARTRATE 25 MG PO TABS Oral Take 25 mg by mouth 2 (two) times daily.      [name] NAPROXEN SODIUM 220 MG PO CAPS Oral Take 440 mg by mouth daily as needed. For pain    . PANTOPRAZOLE SODIUM 40 MG PO TBEC Oral Take 40 mg by mouth 2 (two) times daily.      [name] SIMVASTATIN 40 MG PO TABS Oral Take 40 mg by mouth at bedtime.      [name] NITROGLYCERIN 0.4 MG SL SUBL Sublingual Place 1 tablet (0.4 mg total) under the tongue every 5 (five) minutes as needed for chest pain. 25 tablet 12    BP 150/85  Pulse 82  Temp(Src) 98.6 F (37 C) (Oral)  Resp 20  SpO2 100%  Physical Exam  Nursing note and vitals reviewed. Constitutional: He is oriented to person, place, and time. He appears well-developed and well-nourished. No distress.  HENT:  Head: Normocephalic and atraumatic.  Mouth/Throat: Oropharynx is clear and moist.  Eyes: Conjunctivae and EOM are normal. Pupils are equal, round, and reactive to light.  Neck: Normal range of motion. Neck supple.  Cardiovascular: Normal rate, regular rhythm and intact distal pulses.   No murmur heard. Pulmonary/Chest: Effort normal and breath sounds normal. No respiratory distress. He has no wheezes. He has no rales.  Abdominal: Soft. Bowel sounds are normal. He exhibits no distension. There is no tenderness. There is no rebound and no guarding.  Genitourinary:       Tenderness in the rectal area with a non-thrombosed hemorrhoid present. Surgical areas appear to be healing well with no bleeding. On rectal exam there is soft stool at the fingertip but no impaction.  Musculoskeletal: Normal range of motion. He exhibits no edema and no tenderness.  Neurological: He is alert and oriented to person, place, and time.  Skin: Skin is warm and dry. No rash noted. No erythema.  Psychiatric: He has a normal mood and affect. His behavior is normal.    ED Course  Procedures (including critical care time)  Labs Reviewed - No data to  display No results found.   No diagnosis found.    MDM   Patient recently underwent hemorrhoid surgery on Friday and states since then has been unable to have a bowel movement. He is taking multiple stool softeners and states currently he is not taking any pain medication as he was concerned it would make his constipation worse. He denies any nausea or vomiting or symptoms suggestive of obstruction. On exam there is some soft stool at my fingertip but  unable to remove any. The surgical area appears to be healing well without complication. Will give patient a pain shot to see if we can control his pain and he is able to have a bowel movement. Also will give him Proctofoam to see if that helps with his symptoms. Currently do not feel the patient needs any labs or imaging at this time as there is no signs of obstruction, abdomen is soft, normal vital signs, and no infectious symptoms.        Gwyneth Sprout, MD 11/04/11 [phone]

## 2011-11-04 NOTE — Telephone Encounter (Signed)
MR [facility] CALLED RE CONTINUED CONSTIPATION ISSUE. HE WAS SEEN IN ER LAST NIGHT RE THIS ISSUE. STILL HAS NOT HAD BOWEL MOVEMENT.DR. Magnus Ivan ADVISED OF SITUATION HE RECOMMENDED PT TAKE MAGNESIUM CITRATE FOR THIS PROBLEM. I NOTIFIED PATIENT OF DR. BLACKMAN'S ADVICE.

## 2011-11-04 NOTE — Telephone Encounter (Signed)
Pt called in stating that he tried everything that was suggested and nothing is working. Pt stated it is an emergency situation and that it was very painful. I spoke with Pattricia Boss Cytogeneticist) who advised she has spoken to the patient earlier and that he needed to give the items recommended time to work. And that if the situation has not resolved and has gotten worse to go the emergency room. I got back on the phone with the patient and advised of what was discussed with Pattricia Boss and the patient stated he was going to the hospital.

## 2011-11-04 NOTE — Telephone Encounter (Signed)
Called pt this morning and he stated that he is doing better this morning.

## 2011-11-04 NOTE — ED Notes (Signed)
Pt c/o lower abd pain, rectal pain and constipation; denies n/v/d; suprapubic tenderness present; normoactive bowel sounds

## 2011-11-04 NOTE — ED Notes (Signed)
Called x 3, no answer.  

## 2011-11-04 NOTE — ED Provider Notes (Signed)
History     CSN: [id number] Arrival date & time: 11/04/2011  3:38 PM   First MD Initiated Contact with Patient 11/04/11 1543      Chief Complaint  Patient presents with  . Constipation  . Rectal Pain  . Abdominal Pain    (Consider location/radiation/quality/duration/timing/severity/associated sxs/prior treatment) Patient is a 55 y.o. male presenting with constipation and abdominal pain. The history is provided by the patient.  Constipation  The current episode started 3 to 5 days ago. The problem has been unchanged. The pain is moderate. The stool is described as soft. There was no prior successful therapy. Prior unsuccessful therapies include stool softeners and laxatives. Associated symptoms include abdominal pain, hemorrhoids and rectal pain. Pertinent negatives include no anorexia, no fever, no diarrhea, no hematemesis, no nausea, no vomiting, no hematuria, no chest pain, no headaches, no coughing, no difficulty breathing and no rash.  Abdominal Pain The primary symptoms of the illness include abdominal pain. The primary symptoms of the illness do not include fever, shortness of breath, nausea, vomiting, diarrhea, hematemesis or dysuria.  Additional symptoms associated with the illness include constipation. Symptoms associated with the illness do not include anorexia, hematuria or back pain.   Patient is 5 days status post hemorrhoidal surgery, she has been having difficulty with bowel movements for the past several days since the time of surgery he has tried stool softeners he tried the neural axis and mag citrate without success. No nausea or vomiting abdominal pain is in the suprapubic area radiates to the rectal area no rectal bleeding. Patient was just seen in the emergency department earlier this morning and discharged at 5 in the morning. Rectal exam at that time revealed soft stool in the vault no evidence of impaction surgical wounds healing appropriately. Patient has not  scheduled a followup surgery until January 9.  Past Medical History  Diagnosis Date  . Peripheral vascular disease   . Hypertension   . Hyperlipidemia   . Pancreatic cancer     surgery 2009  . Anemia, unspecified   . Esophageal reflux   . Helicobacter pylori (H. pylori) infection   . Unspecified hemorrhoids without mention of complication   . Impotence of organic origin   . Other peripheral vascular disease   . Bell's palsy   . Acute myocardial infarction, unspecified site, episode of care unspecified   . Coronary atherosclerosis of unspecified type of vessel, native or graft   . Shortness of breath   . Blood transfusion     during treatment for Ca  . Neuromuscular disorder     h/o bells palsy  . Arthritis     low back & all over     Past Surgical History  Procedure Date  . Pancreatic  cancer 05/10/2008    Resection of distal panrease and spleen  . Splenectomy   . Coronary artery bypass graft nov 2011  . Back surgery     1992  . Cardiac catheterization   . Hemorrhoid surgery 10/30/2011    Procedure: HEMORRHOIDECTOMY;  Surgeon: Shelly Rubenstein, MD;  Location: [facility] OR;  Service: General;  Laterality: N/A;    Family History  Problem Relation Age of Onset  . Heart attack Father     died of MI at age 27  . Cancer Father     pt unaware of what kind  . Anesthesia problems Neg Hx   . Hypotension Neg Hx   . Malignant hyperthermia Neg Hx   . Pseudochol deficiency Neg  Hx     History  Substance Use Topics  . Smoking status: Former Smoker    Types: Cigarettes    Quit date: 11/29/2008  . Smokeless tobacco: Never Used  . Alcohol Use: Yes     Social use/ once per month      Review of Systems  Constitutional: Negative for fever.  HENT: Negative for neck pain.   Eyes: Negative for visual disturbance.  Respiratory: Negative for cough, chest tightness and shortness of breath.   Cardiovascular: Negative for chest pain.  Gastrointestinal: Positive for abdominal pain,  constipation, rectal pain and hemorrhoids. Negative for nausea, vomiting, diarrhea, anorexia and hematemesis.  Genitourinary: Negative for dysuria and hematuria.  Musculoskeletal: Negative for back pain.  Skin: Negative for rash.  Neurological: Negative for headaches.  Hematological: Does not bruise/bleed easily.    Allergies  Review of patient's allergies indicates no known allergies.  Home Medications   Current Outpatient Rx  Name Route Sig Dispense Refill  . ACETAMINOPHEN 500 MG PO TABS Oral Take 500 mg by mouth every 6 (six) hours as needed. Pain     . ASPIRIN EC 81 MG PO TBEC Oral Take 81 mg by mouth daily.      [name] DOCUSATE SODIUM 100 MG PO CAPS Oral Take 100 mg by mouth 2 (two) times daily as needed. constipation     . EZETIMIBE 10 MG PO TABS Oral Take 10 mg by mouth daily.      . OMEGA-3 FATTY ACIDS 1000 MG PO CAPS Oral Take 1 g by mouth daily.     [name] HYDROCODONE-ACETAMINOPHEN 7.5-500 MG PO TABS Oral Take 1 tablet by mouth every 4 (four) hours as needed for pain. 30 tablet 1    7.5/325 instead  . HYDROCORTISONE ACE-PRAMOXINE 1-1 % RE FOAM Rectal Place 1 applicator rectally 2 (two) times daily. 10 g 0  . LIDOCAINE 5 % EX OINT Topical Apply topically 3 (three) times daily. To perianal area for pain 35.44 g 1  . LISINOPRIL 5 MG PO TABS Oral Take 5 mg by mouth daily.      [name] METOPROLOL TARTRATE 25 MG PO TABS Oral Take 25 mg by mouth 2 (two) times daily.      [name] NAPROXEN SODIUM 220 MG PO CAPS Oral Take 440 mg by mouth daily as needed. For pain    . NITROGLYCERIN 0.4 MG SL SUBL Sublingual Place 1 tablet (0.4 mg total) under the tongue every 5 (five) minutes as needed for chest pain. 25 tablet 12  . PANTOPRAZOLE SODIUM 40 MG PO TBEC Oral Take 40 mg by mouth 2 (two) times daily.      [name] SIMVASTATIN 40 MG PO TABS Oral Take 40 mg by mouth at bedtime.        BP 140/98  Pulse 91  Temp(Src) 98 F (36.7 C) (Oral)  Resp 16  SpO2 98%  Physical Exam  Nursing note and vitals  reviewed. Constitutional: He is oriented to person, place, and time. He appears well-developed and well-nourished.  HENT:  Head: Normocephalic and atraumatic.  Eyes: Conjunctivae and EOM are normal. Pupils are equal, round, and reactive to light.  Neck: Normal range of motion. Neck supple.  Cardiovascular: Normal rate, regular rhythm and normal heart sounds.   Pulmonary/Chest: Effort normal and breath sounds normal.  Abdominal: Soft. Bowel sounds are normal. He exhibits distension. There is no tenderness. There is no rebound and no guarding.       Mild distention  Genitourinary:  Rectal exam just completed by Dr. Anitra Lauth at 4 in the morning. Not repeated.  Musculoskeletal: Normal range of motion.  Neurological: He is alert and oriented to person, place, and time. No cranial nerve deficit. He exhibits normal muscle tone. Coordination normal.  Skin: Skin is warm. No rash noted.    ED Course  Procedures (including critical care time)  Labs Reviewed - No data to display No results found.   1. Constipation       MDM   Patient is discharged from emergency department at about 5 in the morning for the same complaint. Dr. Plunkett's note and physical findings reviewed. She did a rectal exam without any evidence impaction had soft stool in the rectal vault no sig. abnormalities from the hemorrhoidal surgery. Patient denies any nausea vomiting or fever, subjective abdominal pain is in the suprapubic area which be consistent with radiation from the rectum. Patient is only 5 days status post hemorrhoidal surgery by Dr. Magnus Ivan from [facility] surgery. Currently no evidence of obstruction clinically abdomen is soft positive bowel sounds suspect touches constipation physiologic status post hemorrhoidal surgery. The discomfort patient will probably have to start his hydrocodone medicine he has at home and to drink plenty of fluids and give  surgery call for followup prior to the already  scheduled January 9 date. Vital signs of any sig in change from those recorded at 5 in the morning.        Shelda Jakes, MD 11/04/11 [phone]

## 2011-11-04 NOTE — ED Notes (Signed)
Pt c/o constipation, rectal pain, and lower abd pain x 3 days; last bm was 7 days ago (had hemorrhoid sx on last Friday and taking pain pills); seen last night/early this am here and given pain shot; called pcp this am and they suggested mag citrate, which he used with no results

## 2011-11-06 ENCOUNTER — Encounter (INDEPENDENT_AMBULATORY_CARE_PROVIDER_SITE_OTHER): Payer: Self-pay | Admitting: Surgery

## 2011-11-09 ENCOUNTER — Encounter (INDEPENDENT_AMBULATORY_CARE_PROVIDER_SITE_OTHER): Payer: Self-pay | Admitting: Surgery

## 2011-11-09 ENCOUNTER — Ambulatory Visit (INDEPENDENT_AMBULATORY_CARE_PROVIDER_SITE_OTHER): Payer: PRIVATE HEALTH INSURANCE | Admitting: Surgery

## 2011-11-09 VITALS — BP 134/92 | HR 72 | Temp 97.2°F | Resp 18 | Ht 70.0 in | Wt 203.8 lb

## 2011-11-09 DIAGNOSIS — Z09 Encounter for follow-up examination after completed treatment for conditions other than malignant neoplasm: Secondary | ICD-10-CM

## 2011-11-09 NOTE — Progress Notes (Signed)
Subjective:     Patient ID: Steven Hale, male   DOB: 1956/02/12, 55 y.o.   MRN: [id number]  HPI He is here for another postoperative visit. He is still having some pain and constipation   Review of Systems     Objective:   Physical Exam On exam, the hemorrhoidectomy site is clean with an open wound. There is still some swelling. There is no evidence of infection    Assessment:     Patient status post hemorrhoidectomy    Plan:     I am going to write him for some Analpram. He will continue stool softeners and sitz baths

## 2011-11-24 ENCOUNTER — Encounter (INDEPENDENT_AMBULATORY_CARE_PROVIDER_SITE_OTHER): Payer: Self-pay | Admitting: Surgery

## 2011-11-24 ENCOUNTER — Ambulatory Visit (INDEPENDENT_AMBULATORY_CARE_PROVIDER_SITE_OTHER): Payer: PRIVATE HEALTH INSURANCE | Admitting: Surgery

## 2011-11-24 VITALS — BP 118/80 | HR 68 | Temp 96.9°F | Resp 18 | Ht 70.0 in | Wt 204.2 lb

## 2011-11-24 DIAGNOSIS — Z09 Encounter for follow-up examination after completed treatment for conditions other than malignant neoplasm: Secondary | ICD-10-CM

## 2011-11-24 NOTE — Progress Notes (Signed)
Subjective:     Patient ID: Steven Hale, male   DOB: 10/31/1956, 56 y.o.   MRN: [id number]  HPI He is here for another postoperative visit. He reports now his discomfort is much much less  Review of Systems     Objective:   Physical Exam On exam, there is much less perianal swelling at the hemorrhoidectomy site    Assessment:     Patient status post hemorrhoidectomy    Plan:     He will continue his current care. I will see him back in one month

## 2011-12-03 ENCOUNTER — Telehealth (INDEPENDENT_AMBULATORY_CARE_PROVIDER_SITE_OTHER): Payer: Self-pay

## 2011-12-03 NOTE — Telephone Encounter (Signed)
Per Dr. Magnus Ivan who was in the office today continue with soaking, and creams. Keep appointment of 12/22/2011.  Patient advised to keep his stools soft.

## 2011-12-03 NOTE — Telephone Encounter (Signed)
C/o bleeding hemorrhoids, with swelling- Surgery 10/30/2011  TWO COLUMN INTERNAL AND EXTERNAL HEMORRHOIDECTOMY - Next post op appoint 12/22/2011- He stated he was to call  it there was any problems- Soaking and using the creams are not helping.

## 2011-12-22 ENCOUNTER — Ambulatory Visit (INDEPENDENT_AMBULATORY_CARE_PROVIDER_SITE_OTHER): Payer: PRIVATE HEALTH INSURANCE | Admitting: Surgery

## 2011-12-22 ENCOUNTER — Encounter (INDEPENDENT_AMBULATORY_CARE_PROVIDER_SITE_OTHER): Payer: Self-pay | Admitting: Surgery

## 2011-12-22 VITALS — BP 125/77 | HR 78 | Temp 98.6°F | Resp 16 | Ht 70.0 in | Wt 205.8 lb

## 2011-12-22 DIAGNOSIS — Z09 Encounter for follow-up examination after completed treatment for conditions other than malignant neoplasm: Secondary | ICD-10-CM

## 2011-12-22 NOTE — Progress Notes (Signed)
Subjective:     Patient ID: Steven Hale, male   DOB: 07-01-1956, 56 y.o.   MRN: [id number]  HPI He reports that he is still having some perianal discomfort and bleeding.  Review of Systems     Objective:   Physical Exam On exam, there is still significant perianal swelling. There is also a large skin tag.    Assessment:     Patient status post hemorrhoidectomy    Plan:     I am going to start him on steroid suppositories. He will continue sitz baths as well. Hopefully this will improve without need for further surgery

## 2012-01-05 ENCOUNTER — Encounter (INDEPENDENT_AMBULATORY_CARE_PROVIDER_SITE_OTHER): Payer: Self-pay | Admitting: Surgery

## 2012-01-05 ENCOUNTER — Ambulatory Visit (INDEPENDENT_AMBULATORY_CARE_PROVIDER_SITE_OTHER): Payer: PRIVATE HEALTH INSURANCE | Admitting: Surgery

## 2012-01-05 VITALS — BP 142/96 | HR 76 | Temp 97.2°F | Resp 16 | Ht 70.0 in | Wt 206.4 lb

## 2012-01-05 DIAGNOSIS — Z09 Encounter for follow-up examination after completed treatment for conditions other than malignant neoplasm: Secondary | ICD-10-CM

## 2012-01-05 NOTE — Progress Notes (Signed)
Subjective:     Patient ID: Steven Hale, male   DOB: 12-13-1955, 56 y.o.   MRN: [id number]  HPI He is here for another postoperative visit. He reports improvement since doing the steroid suppositories. He has no blood with bowel movements and minimal discomfort  Review of Systems     Objective:   Physical Exam On exam, there is a skin tag which is quite large from hemorrhoidectomy site. This was nontender    Assessment:     Patient status post hemorrhoidectomy    Plan:     I will see him back in 6 months. It is skin tag remains large I may consider excision in the office

## 2012-01-20 ENCOUNTER — Telehealth: Payer: Self-pay | Admitting: Cardiology

## 2012-01-20 NOTE — Telephone Encounter (Signed)
Spoke with pt, he is asking for a refill for his NTG. Called to [facility].

## 2012-01-20 NOTE — Telephone Encounter (Signed)
New Msg: pt calling wanting to speak with nurse regarding pt taking nitro for blood flow in pt legs.  Please return pt call to discuss further.

## 2012-02-05 ENCOUNTER — Telehealth (INDEPENDENT_AMBULATORY_CARE_PROVIDER_SITE_OTHER): Payer: Self-pay

## 2012-02-05 NOTE — Telephone Encounter (Signed)
The patient called for a refill on Hydrocortisone suppositories.  I paged Dr Magnus Ivan and he gave the ok.  I faxed refill authorization to [facility] [phone] [facility] 25mg  suppositories one per rectum twice daily for 10 days #20, no refill.  Left message for pt.

## 2012-02-17 ENCOUNTER — Telehealth: Payer: Self-pay | Admitting: Cardiology

## 2012-02-17 NOTE — Telephone Encounter (Signed)
Pt calling re medication °

## 2012-02-17 NOTE — Telephone Encounter (Signed)
Spoke with pt, he was questioning about a script for viagra. Pt made aware dr Jens Som does not prescribe this med, he will need to call his PCP. Pt voiced understanding.

## 2012-02-26 ENCOUNTER — Other Ambulatory Visit (INDEPENDENT_AMBULATORY_CARE_PROVIDER_SITE_OTHER): Payer: Self-pay | Admitting: Surgery

## 2012-02-26 NOTE — Telephone Encounter (Signed)
Can this pt have this RX 

## 2012-02-26 NOTE — Telephone Encounter (Signed)
yes

## 2012-03-18 ENCOUNTER — Other Ambulatory Visit (INDEPENDENT_AMBULATORY_CARE_PROVIDER_SITE_OTHER): Payer: Self-pay | Admitting: Surgery

## 2012-03-18 NOTE — Telephone Encounter (Signed)
Can this pt have refill on this med . Pt was last seen back on 01-05-12

## 2012-03-28 ENCOUNTER — Other Ambulatory Visit (INDEPENDENT_AMBULATORY_CARE_PROVIDER_SITE_OTHER): Payer: Self-pay | Admitting: Surgery

## 2012-04-15 ENCOUNTER — Other Ambulatory Visit (INDEPENDENT_AMBULATORY_CARE_PROVIDER_SITE_OTHER): Payer: Self-pay | Admitting: Surgery

## 2012-05-05 ENCOUNTER — Telehealth: Payer: Self-pay | Admitting: Cardiology

## 2012-05-05 NOTE — Telephone Encounter (Signed)
Walk In pt Form" Pt Dropped Off paper to Be Completed From [facility]"  Sent to De[facility] 05/05/12/KM

## 2012-05-09 ENCOUNTER — Telehealth: Payer: Self-pay | Admitting: Cardiology

## 2012-05-09 NOTE — Telephone Encounter (Signed)
Fu call °Patient returning your call °

## 2012-05-09 NOTE — Telephone Encounter (Signed)
Spoke with pt, appt made for surgical clearance.

## 2012-05-18 ENCOUNTER — Encounter: Payer: Self-pay | Admitting: Cardiology

## 2012-05-18 ENCOUNTER — Ambulatory Visit (INDEPENDENT_AMBULATORY_CARE_PROVIDER_SITE_OTHER): Payer: Self-pay | Admitting: Cardiology

## 2012-05-18 VITALS — BP 142/84 | HR 55 | Ht 69.0 in | Wt 208.8 lb

## 2012-05-18 DIAGNOSIS — I739 Peripheral vascular disease, unspecified: Secondary | ICD-10-CM

## 2012-05-18 DIAGNOSIS — I10 Essential (primary) hypertension: Secondary | ICD-10-CM

## 2012-05-18 DIAGNOSIS — Z01810 Encounter for preprocedural cardiovascular examination: Secondary | ICD-10-CM | POA: Insufficient documentation

## 2012-05-18 DIAGNOSIS — R079 Chest pain, unspecified: Secondary | ICD-10-CM

## 2012-05-18 DIAGNOSIS — E7800 Pure hypercholesterolemia, unspecified: Secondary | ICD-10-CM

## 2012-05-18 LAB — BASIC METABOLIC PANEL
BUN: 13 mg/dL (ref 6–23)
CO2: 29 mEq/L (ref 19–32)
Chloride: 102 mEq/L (ref 96–112)
GFR: 98.2 mL/min (ref >60.00)
Glucose, Bld: 107 mg/dL — ABNORMAL HIGH (ref 70–99)
Potassium: 4.2 mEq/L (ref 3.5–5.1)

## 2012-05-18 LAB — LIPID PANEL
HDL: 55.6 mg/dL (ref >39.00)
Total CHOL/HDL Ratio: 4

## 2012-05-18 LAB — HEPATIC FUNCTION PANEL
ALT: 25 U/L (ref 0–53)
Bilirubin, Direct: 0 mg/dL (ref 0.0–0.3)
Total Bilirubin: 0.6 mg/dL (ref 0.3–1.2)

## 2012-05-18 NOTE — Assessment & Plan Note (Signed)
Patient scheduled for knee surgery. Plan Myoview preoperatively.

## 2012-05-18 NOTE — Patient Instructions (Addendum)
Your physician wants you to follow-up in: ONE YEAR WITH DR Shelda Pal will receive a reminder letter in the mail two months in advance. If you don't receive a letter, please call our office to schedule the follow-up appointment.   Your physician has requested that you have a lexiscan myoview. For further information please visit [email or web address] Please follow instruction sheet, as given.   Your physician recommends that you HAVE LAB WORK TODAY  Your physician has requested that you have a lower extremity arterial duplex. During this test,  ultrasound is used to evaluate arterial blood flow in the legs. Allow one hour for this exam. There are no restrictions or special instructions.

## 2012-05-18 NOTE — Progress Notes (Signed)
HPI: Pleasant male for fu of CAD. Previously followed at [facility]. Patient with multiple interventions in the past. Cardiac catheterization in November of 2011 showed severe three-vessel coronary disease and he underwent coronary artery bypass and graft with a left internal mammary artery to LAD, saphenous vein graft to obtuse marginal 1, sequential saphenous vein graft to distal right coronary and obtuse marginal 2. Preoperative catheterization also revealed a 40% right renal artery stenosis but no aortoiliac disease. Patient states that he was dismissed from [facility] as he had no insurance. He has had continued chest pain since his bypass surgery. ABIs in July of 2012 were normal. I last saw him in June of 2012. Since then he does have some dyspnea on exertion but no orthopnea, PND or pedal edema. He continues to have occasional cramping in his chest that is not clearly exertional. He has pain in his calves bilaterally with ambulation after one block.   Current Outpatient Prescriptions  Medication Sig Dispense Refill  . aspirin EC 81 MG tablet Take 81 mg by mouth daily.        [name] ezetimibe (ZETIA) 10 MG tablet Take 10 mg by mouth daily.        . fish oil-omega-3 fatty acids 1000 MG capsule Take 1 g by mouth daily.       [name] lisinopril (PRINIVIL,ZESTRIL) 5 MG tablet Take 2.5 mg by mouth daily.       . metoprolol tartrate (LOPRESSOR) 25 MG tablet Take 25 mg by mouth 2 (two) times daily.        . Multiple Vitamin (MULTIVITAMIN) tablet Take 1 tablet by mouth daily.      . nitroGLYCERIN (NITROSTAT) 0.4 MG SL tablet Place 1 tablet (0.4 mg total) under the tongue every 5 (five) minutes as needed for chest pain.  25 tablet  12  . simvastatin (ZOCOR) 40 MG tablet Take 40 mg by mouth at bedtime.           Past Medical History  Diagnosis Date  . Peripheral vascular disease   . Hypertension   . Hyperlipidemia   . Pancreatic cancer     surgery 2009  . Anemia, unspecified   .  Esophageal reflux   . Helicobacter pylori (H. pylori) infection   . Unspecified hemorrhoids without mention of complication   . Impotence of organic origin   . Other peripheral vascular disease   . Bell's palsy   . Acute myocardial infarction, unspecified site, episode of care unspecified   . Coronary atherosclerosis of unspecified type of vessel, native or graft   . Shortness of breath   . Blood transfusion     during treatment for Ca  . Neuromuscular disorder     h/o bells palsy  . Arthritis     low back & all over     Past Surgical History  Procedure Date  . Pancreatic  cancer 05/10/2008    Resection of distal panrease and spleen  . Splenectomy   . Coronary artery bypass graft nov 2011  . Back surgery     1992  . Cardiac catheterization   . Hemorrhoid surgery 10/30/2011    Procedure: HEMORRHOIDECTOMY;  Surgeon: Shelly Rubenstein, MD;  Location: [facility] OR;  Service: General;  Laterality: N/A;    History   Social History  . Marital Status: Single    Spouse Name: N/A    Number of Children: 0  . Years of Education: N/A   Occupational History  .  Social History Main Topics  . Smoking status: Former Smoker    Types: Cigarettes    Quit date: 11/29/2008  . Smokeless tobacco: Never Used  . Alcohol Use: Yes     Social use/ once per month  . Drug Use: No  . Sexually Active: Not on file   Other Topics Concern  . Not on file   Social History Narrative   He works at the night shift at [employer], no children    ROS: pain in right knee but no fevers or chills, productive cough, hemoptysis, dysphasia, odynophagia, melena, hematochezia, dysuria, hematuria, rash, seizure activity, orthopnea, PND, pedal edema. Remaining systems are negative.  Physical Exam: Well-developed well-nourished in no acute distress.  Skin is warm and dry.  HEENT is normal.  Neck is supple.  Chest is clear to auscultation with normal expansion.    Cardiovascular exam is regular rate and rhythm.  Abdominal exam nontender or distended. No masses palpated. Extremities show no edema. DP pulses 1+ bilaterally. neuro grossly intact  ECG sinus rhythm at a rate of 55. No ST changes.

## 2012-05-18 NOTE — Assessment & Plan Note (Signed)
Symptoms somewhat atypical as they're chronic. Schedule Myoview for risk stratification.

## 2012-05-18 NOTE — Assessment & Plan Note (Signed)
Blood pressure controlled. Continue present medications. 

## 2012-05-18 NOTE — Assessment & Plan Note (Signed)
Patient with history of peripheral vascular disease. Repeat ABIs with Doppler.

## 2012-05-18 NOTE — Assessment & Plan Note (Signed)
Continue aspirin and statin. 

## 2012-05-18 NOTE — Assessment & Plan Note (Signed)
Continue statin. Check lipids and liver. 

## 2012-05-20 ENCOUNTER — Other Ambulatory Visit: Payer: Self-pay | Admitting: *Deleted

## 2012-05-20 DIAGNOSIS — E7800 Pure hypercholesterolemia, unspecified: Secondary | ICD-10-CM

## 2012-05-20 MED ORDER — ATORVASTATIN CALCIUM 80 MG PO TABS: 80.0000 mg | ORAL_TABLET | Freq: Every day | ORAL | Status: DC

## 2012-05-23 ENCOUNTER — Other Ambulatory Visit (INDEPENDENT_AMBULATORY_CARE_PROVIDER_SITE_OTHER): Payer: Self-pay | Admitting: Surgery

## 2012-05-23 NOTE — Telephone Encounter (Signed)
Can this pt have this Rx 

## 2012-05-23 NOTE — Telephone Encounter (Signed)
Can this pt have this RX

## 2012-05-24 ENCOUNTER — Encounter (INDEPENDENT_AMBULATORY_CARE_PROVIDER_SITE_OTHER): Payer: Self-pay

## 2012-05-24 DIAGNOSIS — I70219 Atherosclerosis of native arteries of extremities with intermittent claudication, unspecified extremity: Secondary | ICD-10-CM

## 2012-05-24 DIAGNOSIS — I739 Peripheral vascular disease, unspecified: Secondary | ICD-10-CM

## 2012-05-31 ENCOUNTER — Ambulatory Visit (HOSPITAL_COMMUNITY): Payer: Self-pay | Attending: Cardiovascular Disease | Admitting: Radiology

## 2012-05-31 VITALS — BP 113/85 | HR 49 | Ht 69.0 in | Wt 205.0 lb

## 2012-05-31 DIAGNOSIS — R0602 Shortness of breath: Secondary | ICD-10-CM

## 2012-05-31 DIAGNOSIS — Z951 Presence of aortocoronary bypass graft: Secondary | ICD-10-CM | POA: Insufficient documentation

## 2012-05-31 DIAGNOSIS — R0989 Other specified symptoms and signs involving the circulatory and respiratory systems: Secondary | ICD-10-CM | POA: Insufficient documentation

## 2012-05-31 DIAGNOSIS — I2510 Atherosclerotic heart disease of native coronary artery without angina pectoris: Secondary | ICD-10-CM

## 2012-05-31 DIAGNOSIS — I739 Peripheral vascular disease, unspecified: Secondary | ICD-10-CM | POA: Insufficient documentation

## 2012-05-31 DIAGNOSIS — R61 Generalized hyperhidrosis: Secondary | ICD-10-CM | POA: Insufficient documentation

## 2012-05-31 DIAGNOSIS — R5381 Other malaise: Secondary | ICD-10-CM | POA: Insufficient documentation

## 2012-05-31 DIAGNOSIS — R0789 Other chest pain: Secondary | ICD-10-CM | POA: Insufficient documentation

## 2012-05-31 DIAGNOSIS — I10 Essential (primary) hypertension: Secondary | ICD-10-CM | POA: Insufficient documentation

## 2012-05-31 DIAGNOSIS — R0609 Other forms of dyspnea: Secondary | ICD-10-CM | POA: Insufficient documentation

## 2012-05-31 DIAGNOSIS — Z87891 Personal history of nicotine dependence: Secondary | ICD-10-CM | POA: Insufficient documentation

## 2012-05-31 DIAGNOSIS — E663 Overweight: Secondary | ICD-10-CM | POA: Insufficient documentation

## 2012-05-31 DIAGNOSIS — R110 Nausea: Secondary | ICD-10-CM | POA: Insufficient documentation

## 2012-05-31 DIAGNOSIS — R079 Chest pain, unspecified: Secondary | ICD-10-CM

## 2012-05-31 DIAGNOSIS — R5383 Other fatigue: Secondary | ICD-10-CM | POA: Insufficient documentation

## 2012-05-31 DIAGNOSIS — R002 Palpitations: Secondary | ICD-10-CM | POA: Insufficient documentation

## 2012-05-31 DIAGNOSIS — E785 Hyperlipidemia, unspecified: Secondary | ICD-10-CM | POA: Insufficient documentation

## 2012-05-31 DIAGNOSIS — R42 Dizziness and giddiness: Secondary | ICD-10-CM | POA: Insufficient documentation

## 2012-05-31 MED ORDER — TECHNETIUM TC 99M TETROFOSMIN IV KIT
33.0000 | PACK | Freq: Once | INTRAVENOUS | Status: AC | PRN
  Administered 2012-05-31: 33 via INTRAVENOUS

## 2012-05-31 MED ORDER — REGADENOSON 0.4 MG/5ML IV SOLN
0.4000 mg | Freq: Once | INTRAVENOUS | Status: AC
  Administered 2012-05-31: 0.4 mg via INTRAVENOUS

## 2012-05-31 MED ORDER — TECHNETIUM TC 99M TETROFOSMIN IV KIT
11.0000 | PACK | Freq: Once | INTRAVENOUS | Status: AC | PRN
  Administered 2012-05-31: 11 via INTRAVENOUS

## 2012-05-31 NOTE — Progress Notes (Signed)
[facility] SITE 3 NUCLEAR MED [address] [place] [address] [phone]  Cardiology Nuclear Med Study  Steven Hale is a 56 y.o. male     MRN : [id number]     DOB: May 03, 1956  Procedure Date: 05/31/2012  Nuclear Med Background Indication for Stress Test:  Evaluation for Ischemia, Graft Patency and Pending Clearance for (R) Knee Surgery with Dr. Thomasena Edis  History:  H/O Multiple PCI's; '11 CABG Cardiac Risk Factors: Claudication, History of Smoking, Hypertension, Lipids, Overweight and PVD  Symptoms:  Chest Tightness with and without Exertion (last episode of chest discomfort was about 2-weeks ago), Diaphoresis, Dizziness with Heat, DOE, Fatigue, Nausea and Palpitations   Nuclear Pre-Procedure Caffeine/Decaff Intake:  None NPO After: 7:00pm   Lungs:  clear O2 Sat: 96% on room air. IV 0.9% NS with Angio Cath:  22g  IV Site: R Antecubital  IV Started by:  Irean Hong, RN  Chest Size (in):  48 Cup Size: n/a  Height: 5\' 9"  (1.753 m)  Weight:  205 lb (92.987 kg)  BMI:  Body mass index is 30.27 kg/(m^2). Tech Comments:  Metoprolol taken this am    Nuclear Med Study 1 or 2 day study: 1 day  Stress Test Type:  Treadmill/Lexiscan  Reading MD: Charlton Haws, MD  Order Authorizing Provider:  Olga Millers, MD  Resting Radionuclide: Technetium 33m Tetrofosmin  Resting Radionuclide Dose: 11.0 mCi   Stress Radionuclide:  Technetium 29m Tetrofosmin  Stress Radionuclide Dose: 33.0 mCi           Stress Protocol Rest HR: 49 Stress HR: 88  Rest BP: 113/85 Stress BP: 125/79  Exercise Time (min): 2:00 METS: n/a   Predicted Max HR: 164 bpm % Max HR: 53.66 bpm Rate Pressure Product: 21308   Dose of Adenosine (mg):  n/a Dose of Lexiscan: 0.4 mg  Dose of Atropine (mg): n/a Dose of Dobutamine: n/a mcg/kg/min (at max HR)  Stress Test Technologist: Smiley Houseman, CMA-N  Nuclear Technologist:  Domenic Polite, CNMT     Rest Procedure:  Myocardial perfusion imaging was  performed at rest 45 minutes following the intravenous administration of Technetium 52m Tetrofosmin.  Rest ECG: Sinus bradycardia.  Stress Procedure:  The patient received IV Lexiscan 0.4 mg over 15-seconds with concurrent low level exercise and then Technetium 46m Tetrofosmin was injected at 30-seconds while the patient continued walking one more minute. There were minor nonspecific ST-T wave changes with Lexiscan. Quantitative spect images were obtained after a 45-minute delay.  Stress ECG: No significant change from baseline ECG  QPS Raw Data Images:  Normal; no motion artifact; normal heart/lung ratio. Stress Images:  Normal homogeneous uptake in all areas of the myocardium. Rest Images:  Normal homogeneous uptake in all areas of the myocardium. Subtraction (SDS):  Normal Transient Ischemic Dilatation (Normal <1.22):  1.08 Lung/Heart Ratio (Normal <0.45):  0.29  Quantitative Gated Spect Images QGS EDV:  115 ml QGS ESV:  53 ml  Impression Exercise Capacity:  Lexiscan with low level exercise. BP Response:  Normal blood pressure response. Clinical Symptoms:  No significant symptoms noted. ECG Impression:  No significant ST segment change suggestive of ischemia. Comparison with Prior Nuclear Study: No previous nuclear study performed  Overall Impression:  Normal stress nuclear study.  LV Ejection Fraction: 54%.  LV Wall Motion:  NL LV Function; NL Wall Motion  Charlton Haws

## 2012-06-01 ENCOUNTER — Ambulatory Visit (INDEPENDENT_AMBULATORY_CARE_PROVIDER_SITE_OTHER): Payer: Self-pay | Admitting: Cardiovascular Disease

## 2012-06-01 ENCOUNTER — Encounter: Payer: Self-pay | Admitting: Cardiovascular Disease

## 2012-06-01 VITALS — BP 120/76 | HR 52 | Ht 69.0 in | Wt 206.0 lb

## 2012-06-01 DIAGNOSIS — I739 Peripheral vascular disease, unspecified: Secondary | ICD-10-CM

## 2012-06-01 NOTE — Patient Instructions (Addendum)
Your physician wants you to follow-up in: 6 months with Dr. Kirke Corin.  You will receive a reminder letter in the mail two months  in advance. If you don't receive a letter, please call our office to schedule the follow-up appointment.  Your physician has requested that you have an ankle brachial index (ABI) 2 weeks prior to your 6 month appt. During this test an ultrasound and blood pressure cuff are used to evaluate the arteries that supply the arms and legs with blood. Allow thirty minutes for this exam. There are no restrictions or special instructions.

## 2012-06-02 ENCOUNTER — Telehealth: Payer: Self-pay | Admitting: Cardiology

## 2012-06-02 DIAGNOSIS — I739 Peripheral vascular disease, unspecified: Secondary | ICD-10-CM | POA: Insufficient documentation

## 2012-06-02 NOTE — Progress Notes (Signed)
HPI: This is a 56 year old male who is here today for evaluation of peripheral arterial disease. He has known history of coronary artery disease with multiple interventions in the past. Cardiac catheterization in November of 2011 showed severe three-vessel coronary disease and he underwent coronary artery bypass graft surgery. He also had SFA stents placed bilaterally by Dr. Jacinto Halim years ago. He quit smoking in 2009. ABIs in July of 2012 were normal. Recent ABI was mildly reduced on the left side at 0.8 with evidence of restenosis in the left SFA stent. The right SFA stent was patent. The patient reports significant discomfort in the right calf which he describes as a sharp sensation mostly at rest and occasionally with walking. There is some discomfort on the left side but definitely much better than the right side.   No Known Allergies   Current Outpatient Prescriptions on File Prior to Visit  Medication Sig Dispense Refill  . aspirin EC 81 MG tablet Take 81 mg by mouth daily.        [name] atorvastatin (LIPITOR) 80 MG tablet Take 1 tablet (80 mg total) by mouth daily.  30 tablet  12  . ezetimibe (ZETIA) 10 MG tablet Take 10 mg by mouth daily.        . fish oil-omega-3 fatty acids 1000 MG capsule Take 1 g by mouth daily.       . hydrocortisone (ANUSOL-HC) 25 MG suppository INSERT ONE SUPPOSITORY RECTALLY TWICE DAILY FOR 10 DAYS  30 suppository  0  . lisinopril (PRINIVIL,ZESTRIL) 5 MG tablet Take 2.5 mg by mouth daily.       . metoprolol tartrate (LOPRESSOR) 25 MG tablet Take 25 mg by mouth 2 (two) times daily.        . Multiple Vitamin (MULTIVITAMIN) tablet Take 1 tablet by mouth daily.      . nitroGLYCERIN (NITROSTAT) 0.4 MG SL tablet Place 1 tablet (0.4 mg total) under the tongue every 5 (five) minutes as needed for chest pain.  25 tablet  12     Past Medical History  Diagnosis Date  . Peripheral vascular disease   . Hypertension   . Hyperlipidemia   . Pancreatic cancer     surgery 2009   . Anemia, unspecified   . Esophageal reflux   . Helicobacter pylori (H. pylori) infection   . Unspecified hemorrhoids without mention of complication   . Impotence of organic origin   . Other peripheral vascular disease   . Bell's palsy   . Acute myocardial infarction, unspecified site, episode of care unspecified   . Coronary atherosclerosis of unspecified type of vessel, native or graft   . Shortness of breath   . Blood transfusion     during treatment for Ca  . Neuromuscular disorder     h/o bells palsy  . Arthritis     low back & all over      Past Surgical History  Procedure Date  . Pancreatic  cancer 05/10/2008    Resection of distal panrease and spleen  . Splenectomy   . Coronary artery bypass graft nov 2011  . Back surgery     1992  . Cardiac catheterization   . Hemorrhoid surgery 10/30/2011    Procedure: HEMORRHOIDECTOMY;  Surgeon: Shelly Rubenstein, MD;  Location: [facility] OR;  Service: General;  Laterality: N/A;     Family History  Problem Relation Age of Onset  . Heart attack Father     died of MI at age 80  .  Cancer Father     pt unaware of what kind  . Anesthesia problems Neg Hx   . Hypotension Neg Hx   . Malignant hyperthermia Neg Hx   . Pseudochol deficiency Neg Hx      History   Social History  . Marital Status: Single    Spouse Name: N/A    Number of Children: 0  . Years of Education: N/A   Occupational History  .     Social History Main Topics  . Smoking status: Former Smoker    Types: Cigarettes    Quit date: 11/29/2008  . Smokeless tobacco: Never Used  . Alcohol Use: Yes     Social use/ once per month  . Drug Use: No  . Sexually Active: Not on file   Other Topics Concern  . Not on file   Social History Narrative   He works at the night shift at [employer], no children     PHYSICAL EXAM   BP 120/76  Pulse 52  Ht 5\' 9"  (1.753 m)  Wt 206 lb (93.441 kg)  BMI 30.42 kg/m2  SpO2  97%  Constitutional: He is oriented to person, place, and time. He appears well-developed and well-nourished. No distress.  HENT: No nasal discharge.  Head: Normocephalic and atraumatic.  Eyes: Pupils are equal and round. Right eye exhibits no discharge. Left eye exhibits no discharge.  Neck: Normal range of motion. Neck supple. No JVD present. No thyromegaly present.  Cardiovascular: Normal rate, regular rhythm, normal heart sounds and. Exam reveals no gallop and no friction rub. No murmur heard.  Pulmonary/Chest: Effort normal and breath sounds normal. No stridor. No respiratory distress. He has no wheezes. He has no rales. He exhibits no tenderness.  Abdominal: Soft. Bowel sounds are normal. He exhibits no distension. There is no tenderness. There is no rebound and no guarding.  Musculoskeletal: Normal range of motion. He exhibits no edema and no tenderness.  Neurological: He is alert and oriented to person, place, and time. Coordination normal.  Skin: Skin is warm and dry. No rash noted. He is not diaphoretic. No erythema. No pallor.  Psychiatric: He has a normal mood and affect. His behavior is normal. Judgment and thought content normal.  Vascular: Femoral pulses are normal bilaterally. PT: +2 in the right side and +1 on the left side, DP: + 1 On the right side and absent on the left side.      ASSESSMENT AND PLAN

## 2012-06-02 NOTE — Telephone Encounter (Signed)
Ok for surgery Steven Hale  

## 2012-06-02 NOTE — Assessment & Plan Note (Signed)
The patient had bilateral SFA stents. The one on the right side was found to be patent. The one on the left had significant restenosis. The ABI was mildly reduced on the left side. However, the patient reports most of his symptoms to be on the right side which he seems to be neuropathic discomfort and not true claudication. Given that he doesn't have much symptoms on the left side, I recommend continuing medical therapy with an ABI in 6 months. I will see him at that time. If there is a drop in his ABI, angiography and endovascular intervention would be considered.

## 2012-06-02 NOTE — Telephone Encounter (Signed)
Pt needs clearance request that he brought with him for last ov sent to Clydie Braun / Dr Thomasena Edis surgical scheduler at ( fax# [phone] )  Tomasita Crumble, Clydie Braun is aware Dr/ nurse out till Monday 7/22. If sent Monday pt can still be scheduled for knee surgery the following week.

## 2012-06-02 NOTE — Telephone Encounter (Signed)
New msg Pt is having surgery on his knee and needs surgical clearance paper work faxed to office

## 2012-06-03 ENCOUNTER — Encounter: Payer: Self-pay | Admitting: *Deleted

## 2012-06-03 NOTE — Telephone Encounter (Signed)
CLEARANCE NOTE SENT SEE LETTTERS./CY

## 2012-06-27 ENCOUNTER — Telehealth: Payer: Self-pay | Admitting: Cardiology

## 2012-06-28 NOTE — Telephone Encounter (Signed)
Close  

## 2012-06-29 ENCOUNTER — Other Ambulatory Visit (INDEPENDENT_AMBULATORY_CARE_PROVIDER_SITE_OTHER): Payer: Self-pay

## 2012-06-29 DIAGNOSIS — E7800 Pure hypercholesterolemia, unspecified: Secondary | ICD-10-CM

## 2012-06-29 LAB — LIPID PANEL
HDL: 41.3 mg/dL (ref >39.00)
LDL Cholesterol: 50 mg/dL (ref 0–99)
Total CHOL/HDL Ratio: 3
Triglycerides: 77 mg/dL (ref 0.0–149.0)
VLDL: 15.4 mg/dL (ref 0.0–40.0)

## 2012-06-29 LAB — HEPATIC FUNCTION PANEL
Albumin: 4.5 g/dL (ref 3.5–5.2)
Total Bilirubin: 0.6 mg/dL (ref 0.3–1.2)

## 2012-07-01 ENCOUNTER — Telehealth: Payer: Self-pay | Admitting: *Deleted

## 2012-07-01 ENCOUNTER — Telehealth: Payer: Self-pay | Admitting: Cardiology

## 2012-07-01 NOTE — Telephone Encounter (Signed)
Error

## 2012-07-01 NOTE — Telephone Encounter (Signed)
Message copied by Burnell Blanks on Fri Jul 01, 2012  1:22 PM ------      Message from: Lewayne Bunting      Created: Wed Jun 29, 2012  4:52 PM       Steven Hale

## 2012-07-01 NOTE — Telephone Encounter (Signed)
Mailed copy of labs and left message to call if any questions  

## 2012-07-05 ENCOUNTER — Encounter (INDEPENDENT_AMBULATORY_CARE_PROVIDER_SITE_OTHER): Payer: PRIVATE HEALTH INSURANCE | Admitting: Surgery

## 2012-08-03 ENCOUNTER — Ambulatory Visit (HOSPITAL_COMMUNITY)
Admission: RE | Admit: 2012-08-03 | Discharge: 2012-08-03 | Disposition: A | Discharge status: Home / Self Care | Payer: Worker's Compensation | Source: Ambulatory Visit | Attending: Specialist | Admitting: Specialist

## 2012-08-03 DIAGNOSIS — M79609 Pain in unspecified limb: Secondary | ICD-10-CM

## 2012-08-03 DIAGNOSIS — R52 Pain, unspecified: Secondary | ICD-10-CM

## 2012-08-03 DIAGNOSIS — M7989 Other specified soft tissue disorders: Secondary | ICD-10-CM | POA: Insufficient documentation

## 2012-08-03 DIAGNOSIS — R609 Edema, unspecified: Secondary | ICD-10-CM

## 2012-08-03 NOTE — Progress Notes (Signed)
VASCULAR LAB PRELIMINARY  PRELIMINARY  PRELIMINARY  PRELIMINARY Right lower extremity venous duplex completed.    Preliminary report: Right:  No evidence of DVT, superficial thrombosis, or Baker's cyst.   Amiee Wiley, RVS 08/03/2012, 3:06 PM

## 2012-08-11 ENCOUNTER — Telehealth: Payer: Self-pay | Admitting: Cardiology

## 2012-08-11 NOTE — Telephone Encounter (Signed)
error 

## 2012-08-12 ENCOUNTER — Other Ambulatory Visit (INDEPENDENT_AMBULATORY_CARE_PROVIDER_SITE_OTHER): Payer: Self-pay | Admitting: Surgery

## 2012-08-12 NOTE — Telephone Encounter (Signed)
Can this pt have this RX 

## 2012-08-15 ENCOUNTER — Telehealth: Payer: Self-pay | Admitting: Cardiology

## 2012-08-15 NOTE — Telephone Encounter (Addendum)
Walk in pt Form " Pt Dropped Off Medical Source Statement oF Ability to Do work" sent to [employer] M/[place] 08/15/12/KM

## 2012-08-17 ENCOUNTER — Telehealth: Payer: Self-pay | Admitting: Cardiology

## 2012-08-17 NOTE — Telephone Encounter (Signed)
Pt rtn a call  °

## 2012-08-17 NOTE — Telephone Encounter (Signed)
Spoke with pt, he had brought some medical source statement by the office to be filled out. The pt is uncertain about what the paperwork is for. Left a message for the lawyer's office to call me back.

## 2012-09-01 NOTE — Telephone Encounter (Signed)
Spoke with pt, aware he is not disabled from a cardiac standpoint. Pt voiced understanding.

## 2012-10-11 ENCOUNTER — Telehealth: Payer: Self-pay | Admitting: Cardiovascular Disease

## 2012-10-11 NOTE — Telephone Encounter (Signed)
New problem:   Attorney calling back to speak with you.

## 2012-10-11 NOTE — Telephone Encounter (Signed)
Spoke with attorney, pt is not disabled from a cardiac standpoint.

## 2012-10-11 NOTE — Telephone Encounter (Signed)
N/A.  LMTC. 

## 2012-10-11 NOTE — Telephone Encounter (Signed)
Wanting to speak with Dr Ludwig Clarks nurse, Stanton Kidney.  Message was routed to her.

## 2012-11-29 ENCOUNTER — Encounter (INDEPENDENT_AMBULATORY_CARE_PROVIDER_SITE_OTHER): Payer: No Typology Code available for payment source

## 2012-11-29 DIAGNOSIS — I739 Peripheral vascular disease, unspecified: Secondary | ICD-10-CM

## 2012-11-30 ENCOUNTER — Encounter (HOSPITAL_COMMUNITY): Payer: Self-pay | Admitting: Emergency Medicine

## 2012-11-30 ENCOUNTER — Emergency Department (HOSPITAL_COMMUNITY)
Admission: EM | Admit: 2012-11-30 | Discharge: 2012-11-30 | Disposition: A | Discharge status: Home / Self Care | Payer: No Typology Code available for payment source | Source: Home / Self Care | Attending: Emergency Medicine | Admitting: Emergency Medicine

## 2012-11-30 DIAGNOSIS — I10 Essential (primary) hypertension: Secondary | ICD-10-CM

## 2012-11-30 DIAGNOSIS — Z760 Encounter for issue of repeat prescription: Secondary | ICD-10-CM

## 2012-11-30 MED ORDER — ONE-DAILY MULTI VITAMINS PO TABS: 1.0000 | ORAL_TABLET | Freq: Every day | ORAL | Status: AC

## 2012-11-30 MED ORDER — OMEGA-3 FATTY ACIDS 1000 MG PO CAPS: 1.0000 g | ORAL_CAPSULE | Freq: Every day | ORAL | Status: AC

## 2012-11-30 MED ORDER — METOPROLOL TARTRATE 25 MG PO TABS: 25.0000 mg | ORAL_TABLET | Freq: Two times a day (BID) | ORAL | Status: DC

## 2012-11-30 MED ORDER — NITROGLYCERIN 0.4 MG SL SUBL: 0.4000 mg | SUBLINGUAL_TABLET | SUBLINGUAL | Status: DC | PRN

## 2012-11-30 MED ORDER — LISINOPRIL 5 MG PO TABS: 2.5000 mg | ORAL_TABLET | Freq: Every day | ORAL | Status: DC

## 2012-11-30 NOTE — ED Provider Notes (Signed)
History     CSN: [id number]  Arrival date & time 11/30/12  1130   First MD Initiated Contact with Patient 11/30/12 1149      Chief Complaint  Patient presents with  . Shortness of Breath  . Medication Refill    (Consider location/radiation/quality/duration/timing/severity/associated sxs/prior treatment) HPI Comments: Patient presents to urgent care requesting medication refills. He describes that he has ran out of blood pressure medicine for several months as he was a former [facility] patient. His orange card and also yeast to be reactivated.  Patient presents to [place] today asymptomatic denying shortness of breath or chest pain will ease in the exam table. He does however describe that for several weeks to months he's been short of breath with minimal exertional activities. Patient also reports that he believes that when he takes aspirin it discomforts his stomach as he has in gets some reflux.  Patient got a vascular study yesterday ( report not available as of today, we assume order to buy this former PCP).  Patient is a 57 y.o. male presenting with shortness of breath. The history is provided by the patient.  Shortness of Breath  The current episode started more than 2 weeks ago. The problem occurs frequently. The problem has been unchanged. The problem is moderate. The symptoms are relieved by rest. The symptoms are aggravated by activity. Associated symptoms include shortness of breath. Pertinent negatives include no chest pain, no chest pressure, no orthopnea, no fever, no cough and no wheezing. He has been behaving normally.    Past Medical History  Diagnosis Date  . Peripheral vascular disease   . Hypertension   . Hyperlipidemia   . Pancreatic cancer     surgery 2009  . Anemia, unspecified   . Esophageal reflux   . Helicobacter pylori (H. pylori) infection   . Unspecified hemorrhoids without mention of complication   . Impotence of organic origin   . Other peripheral  vascular disease   . Bell's palsy   . Acute myocardial infarction, unspecified site, episode of care unspecified   . Coronary atherosclerosis of unspecified type of vessel, native or graft   . Shortness of breath   . Blood transfusion     during treatment for Ca  . Neuromuscular disorder     h/o bells palsy  . Arthritis     low back & all over     Past Surgical History  Procedure Date  . Pancreatic  cancer 05/10/2008    Resection of distal panrease and spleen  . Splenectomy   . Coronary artery bypass graft nov 2011  . Back surgery     1992  . Cardiac catheterization   . Hemorrhoid surgery 10/30/2011    Procedure: HEMORRHOIDECTOMY;  Surgeon: Shelly Rubenstein, MD;  Location: [facility] OR;  Service: General;  Laterality: N/A;    Family History  Problem Relation Age of Onset  . Heart attack Father     died of MI at age 67  . Cancer Father     pt unaware of what kind  . Anesthesia problems Neg Hx   . Hypotension Neg Hx   . Malignant hyperthermia Neg Hx   . Pseudochol deficiency Neg Hx     History  Substance Use Topics  . Smoking status: Former Smoker    Types: Cigarettes    Quit date: 11/29/2008  . Smokeless tobacco: Never Used  . Alcohol Use: Yes     Comment: Social use/ once per month  Review of Systems  Constitutional: Positive for activity change. Negative for fever, chills and appetite change.  Respiratory: Positive for shortness of breath. Negative for cough, choking and wheezing.   Cardiovascular: Negative for chest pain, palpitations, orthopnea and leg swelling.  Neurological: Negative for dizziness and light-headedness.    Allergies  Review of patient's allergies indicates no known allergies.  Home Medications   Current Outpatient Rx  Name  Route  Sig  Dispense  Refill  . ASPIRIN EC 81 MG PO TBEC   Oral   Take 81 mg by mouth daily.           . ATORVASTATIN CALCIUM 80 MG PO TABS   Oral   Take 1 tablet (80 mg total) by mouth daily.   30  tablet   12   . EZETIMIBE 10 MG PO TABS   Oral   Take 10 mg by mouth daily.           . OMEGA-3 FATTY ACIDS 1000 MG PO CAPS   Oral   Take 1 capsule (1 g total) by mouth daily.   60 capsule   1   . HYDROCORTISONE ACETATE 25 MG RE SUPP      INSERT ONE SUPPOSITORY RECTALLY TWICE DAILY   30 suppository   0   . LISINOPRIL 5 MG PO TABS   Oral   Take 0.5 tablets (2.5 mg total) by mouth daily.   30 tablet   0   . METOPROLOL TARTRATE 25 MG PO TABS   Oral   Take 1 tablet (25 mg total) by mouth 2 (two) times daily.   60 tablet   0   . ONE-DAILY MULTI VITAMINS PO TABS   Oral   Take 1 tablet by mouth daily.   30 tablet   10   . NITROGLYCERIN 0.4 MG SL SUBL   Sublingual   Place 1 tablet (0.4 mg total) under the tongue every 5 (five) minutes as needed for chest pain.   25 tablet   12   . NITROGLYCERIN 0.4 MG SL SUBL   Sublingual   Place 1 tablet (0.4 mg total) under the tongue every 5 (five) minutes as needed for chest pain.   15 tablet   0     There were no vitals taken for this visit.  Physical Exam  Constitutional: He is oriented to person, place, and time. He appears well-developed and well-nourished. No distress.  HENT:  Head: Normocephalic.  Eyes: Pupils are equal, round, and reactive to light.  Neck: No JVD present.  Cardiovascular: Normal rate.  Exam reveals no gallop and no friction rub.   No murmur heard. Pulmonary/Chest: Effort normal and breath sounds normal.  Neurological: He is alert and oriented to person, place, and time.  Skin: Skin is warm.  Psychiatric: He has a normal mood and affect. His speech is normal and behavior is normal. Thought content normal.    ED Course  Procedures (including critical care time)  Labs Reviewed - No data to display No results found.   1. Encounter for medication refill       MDM  Urgent care visit- 4 medication refills. Patient is seen by of [facility] cardiology, for apparently both cardiovascular what  workup hyperlipidemia and hypertension. Today patient was provided with information about the [facility] at [facility]- for followup and future medication refills. Patient reports today has a followup appointment with cardiologist.        Jimmie Molly, MD 11/30/12 1308

## 2012-11-30 NOTE — ED Notes (Addendum)
Pt c/o having SOB this am around 1000am today w/heavy palpitations Reports that it's difficult to go up a flight of stairs w/out him being tired and having to catch his breath.  Has also noticed frequent leg and abd cramps.  Used to go to [facility] but due to their closure has not seen a PCP.  Needing refill on his lisinopril, metorpolol, lipitor, nitrostat Hx of CAD Was at [facility] to have a doppler done and has a f/u on 12/07/12 w/his cardiologist.   He is alert w/no signs of acute distress

## 2012-12-07 ENCOUNTER — Ambulatory Visit (INDEPENDENT_AMBULATORY_CARE_PROVIDER_SITE_OTHER): Payer: Self-pay | Admitting: Cardiovascular Disease

## 2012-12-07 ENCOUNTER — Encounter: Payer: Self-pay | Admitting: Cardiovascular Disease

## 2012-12-07 VITALS — BP 116/77 | HR 69 | Wt 214.0 lb

## 2012-12-07 DIAGNOSIS — I739 Peripheral vascular disease, unspecified: Secondary | ICD-10-CM

## 2012-12-07 NOTE — Patient Instructions (Addendum)
Your physician has requested that you have a lower or upper extremity arterial duplex in 6 months. This test is an ultrasound of the arteries in the legs or arms. It looks at arterial blood flow in the legs and arms. Allow one hour for Lower and Upper Arterial scans. There are no restrictions or special instructions  Your physician wants you to follow-up in: 6 months.   You will receive a reminder letter in the mail two months in advance. If you don't receive a letter, please call our office to schedule the follow-up appointment.

## 2012-12-10 NOTE — Assessment & Plan Note (Signed)
The patient had bilateral SFA stents with restenosis noted on the left. The ABI was mildly reduced on the left side which improved with recent ABI. Claudication symptoms are atypical and worse on the right side than the left side which does not correlate with his noninvasive studies. I recommend continued observation and medical therapy. Lower extremity arterial duplex will be requested in 6 months to make sure that the restenosis in the left side is not reaching a near occlusive level.

## 2012-12-10 NOTE — Progress Notes (Signed)
HPI: This is a 57 year old male who is here today for a followup visit regarding  peripheral arterial disease. He has known history of coronary artery disease with multiple interventions in the past. Cardiac catheterization in November of 2011 showed severe three-vessel coronary disease and he underwent coronary artery bypass graft surgery. He also had SFA stents placed bilaterally by Dr. Jacinto Halim years ago. He quit smoking in 2009. ABIs in July of 2012 were normal. Last year, ABI was mildly reduced on the left side at 0.8 with evidence of restenosis in the left SFA stent. The right SFA stent was patent. The patient had atypical claudication but his discomfort was actually mostly on the right side and not the left side. Thus, I decided to observe him given that his symptoms did not correlate with duplex finding. He is overall stable. He denies any worsening claudication. Recent ABI showed slight improvement in the left side.   No Known Allergies   Current Outpatient Prescriptions on File Prior to Visit  Medication Sig Dispense Refill  . aspirin EC 81 MG tablet Take 81 mg by mouth daily.        . fish oil-omega-3 fatty acids 1000 MG capsule Take 1 capsule (1 g total) by mouth daily.  60 capsule  1  . lisinopril (PRINIVIL,ZESTRIL) 5 MG tablet Take 0.5 tablets (2.5 mg total) by mouth daily.  30 tablet  0  . metoprolol tartrate (LOPRESSOR) 25 MG tablet Take 1 tablet (25 mg total) by mouth 2 (two) times daily.  60 tablet  0  . Multiple Vitamin (MULTIVITAMIN) tablet Take 1 tablet by mouth daily.  30 tablet  10  . nitroGLYCERIN (NITROSTAT) 0.4 MG SL tablet Place 1 tablet (0.4 mg total) under the tongue every 5 (five) minutes as needed for chest pain.  15 tablet  0     Past Medical History  Diagnosis Date  . Peripheral vascular disease   . Hypertension   . Hyperlipidemia   . Pancreatic cancer     surgery 2009  . Anemia, unspecified   . Esophageal reflux   . Helicobacter pylori (H. pylori)  infection   . Unspecified hemorrhoids without mention of complication   . Impotence of organic origin   . Other peripheral vascular disease   . Bell's palsy   . Acute myocardial infarction, unspecified site, episode of care unspecified   . Coronary atherosclerosis of unspecified type of vessel, native or graft   . Shortness of breath   . Blood transfusion     during treatment for Ca  . Neuromuscular disorder     h/o bells palsy  . Arthritis     low back & all over      Past Surgical History  Procedure Date  . Pancreatic  cancer 05/10/2008    Resection of distal panrease and spleen  . Splenectomy   . Coronary artery bypass graft nov 2011  . Back surgery     1992  . Cardiac catheterization   . Hemorrhoid surgery 10/30/2011    Procedure: HEMORRHOIDECTOMY;  Surgeon: Shelly Rubenstein, MD;  Location: [facility] OR;  Service: General;  Laterality: N/A;     Family History  Problem Relation Age of Onset  . Heart attack Father     died of MI at age 77  . Cancer Father     pt unaware of what kind  . Anesthesia problems Neg Hx   . Hypotension Neg Hx   . Malignant hyperthermia Neg Hx   .  Pseudochol deficiency Neg Hx      History   Social History  . Marital Status: Single    Spouse Name: N/A    Number of Children: 0  . Years of Education: N/A   Occupational History  .     Social History Main Topics  . Smoking status: Former Smoker    Types: Cigarettes    Quit date: 11/29/2008  . Smokeless tobacco: Never Used  . Alcohol Use: Yes     Comment: Social use/ once per month  . Drug Use: No  . Sexually Active: Not on file   Other Topics Concern  . Not on file   Social History Narrative   He works at the night shift at [employer], no children     PHYSICAL EXAM   BP 116/77  Pulse 69  Wt 214 lb (97.07 kg)  Constitutional: He is oriented to person, place, and time. He appears well-developed and well-nourished. No distress.  HENT: No  nasal discharge.  Head: Normocephalic and atraumatic.  Eyes: Pupils are equal and round. Right eye exhibits no discharge. Left eye exhibits no discharge.  Neck: Normal range of motion. Neck supple. No JVD present. No thyromegaly present.  Cardiovascular: Normal rate, regular rhythm, normal heart sounds and. Exam reveals no gallop and no friction rub. No murmur heard.  Pulmonary/Chest: Effort normal and breath sounds normal. No stridor. No respiratory distress. He has no wheezes. He has no rales. He exhibits no tenderness.  Abdominal: Soft. Bowel sounds are normal. He exhibits no distension. There is no tenderness. There is no rebound and no guarding.  Musculoskeletal: Normal range of motion. He exhibits no edema and no tenderness.  Neurological: He is alert and oriented to person, place, and time. Coordination normal.  Skin: Skin is warm and dry. No rash noted. He is not diaphoretic. No erythema. No pallor.  Psychiatric: He has a normal mood and affect. His behavior is normal. Judgment and thought content normal.  Vascular: Femoral pulses are normal bilaterally. PT: +2 in the right side and +1 on the left side, DP: + 1 On the right side and absent on the left side.      ASSESSMENT AND PLAN

## 2013-02-15 ENCOUNTER — Encounter (HOSPITAL_COMMUNITY): Payer: Self-pay | Admitting: Emergency Medicine

## 2013-02-15 ENCOUNTER — Emergency Department (HOSPITAL_COMMUNITY)
Admission: EM | Admit: 2013-02-15 | Discharge: 2013-02-15 | Disposition: A | Discharge status: Home / Self Care | Payer: Worker's Compensation | Attending: Emergency Medicine | Admitting: Emergency Medicine

## 2013-02-15 DIAGNOSIS — Z8619 Personal history of other infectious and parasitic diseases: Secondary | ICD-10-CM | POA: Insufficient documentation

## 2013-02-15 DIAGNOSIS — Z8679 Personal history of other diseases of the circulatory system: Secondary | ICD-10-CM | POA: Insufficient documentation

## 2013-02-15 DIAGNOSIS — I2510 Atherosclerotic heart disease of native coronary artery without angina pectoris: Secondary | ICD-10-CM | POA: Insufficient documentation

## 2013-02-15 DIAGNOSIS — Z8719 Personal history of other diseases of the digestive system: Secondary | ICD-10-CM | POA: Insufficient documentation

## 2013-02-15 DIAGNOSIS — I252 Old myocardial infarction: Secondary | ICD-10-CM | POA: Insufficient documentation

## 2013-02-15 DIAGNOSIS — Z951 Presence of aortocoronary bypass graft: Secondary | ICD-10-CM | POA: Insufficient documentation

## 2013-02-15 DIAGNOSIS — Z862 Personal history of diseases of the blood and blood-forming organs and certain disorders involving the immune mechanism: Secondary | ICD-10-CM | POA: Insufficient documentation

## 2013-02-15 DIAGNOSIS — E785 Hyperlipidemia, unspecified: Secondary | ICD-10-CM | POA: Insufficient documentation

## 2013-02-15 DIAGNOSIS — M5416 Radiculopathy, lumbar region: Secondary | ICD-10-CM

## 2013-02-15 DIAGNOSIS — I10 Essential (primary) hypertension: Secondary | ICD-10-CM | POA: Insufficient documentation

## 2013-02-15 DIAGNOSIS — Z7982 Long term (current) use of aspirin: Secondary | ICD-10-CM | POA: Insufficient documentation

## 2013-02-15 DIAGNOSIS — Z8509 Personal history of malignant neoplasm of other digestive organs: Secondary | ICD-10-CM | POA: Insufficient documentation

## 2013-02-15 DIAGNOSIS — Z8669 Personal history of other diseases of the nervous system and sense organs: Secondary | ICD-10-CM | POA: Insufficient documentation

## 2013-02-15 DIAGNOSIS — Z8739 Personal history of other diseases of the musculoskeletal system and connective tissue: Secondary | ICD-10-CM | POA: Insufficient documentation

## 2013-02-15 DIAGNOSIS — Z79899 Other long term (current) drug therapy: Secondary | ICD-10-CM | POA: Insufficient documentation

## 2013-02-15 DIAGNOSIS — IMO0002 Reserved for concepts with insufficient information to code with codable children: Secondary | ICD-10-CM | POA: Insufficient documentation

## 2013-02-15 DIAGNOSIS — Z87891 Personal history of nicotine dependence: Secondary | ICD-10-CM | POA: Insufficient documentation

## 2013-02-15 MED ORDER — METHOCARBAMOL 500 MG PO TABS: 500.0000 mg | ORAL_TABLET | Freq: Two times a day (BID) | ORAL | Status: DC

## 2013-02-15 MED ORDER — PROMETHAZINE HCL 25 MG/ML IJ SOLN
25.0000 mg | Freq: Once | INTRAMUSCULAR | Status: AC
  Administered 2013-02-15: 25 mg via INTRAVENOUS
  Filled 2013-02-15: qty 1

## 2013-02-15 MED ORDER — PREDNISONE 20 MG PO TABS: ORAL_TABLET | ORAL | Status: DC

## 2013-02-15 MED ORDER — HYDROMORPHONE HCL PF 1 MG/ML IJ SOLN
1.0000 mg | Freq: Once | INTRAMUSCULAR | Status: AC
  Administered 2013-02-15: 1 mg via INTRAMUSCULAR
  Filled 2013-02-15: qty 1

## 2013-02-15 MED ORDER — DEXAMETHASONE SODIUM PHOSPHATE 10 MG/ML IJ SOLN
10.0000 mg | Freq: Once | INTRAMUSCULAR | Status: AC
  Administered 2013-02-15: 10 mg via INTRAMUSCULAR
  Filled 2013-02-15: qty 1

## 2013-02-15 NOTE — ED Provider Notes (Signed)
History     CSN: [id number]  Arrival date & time 02/15/13  4098   First MD Initiated Contact with Patient 02/15/13 [phone]      Chief Complaint  Patient presents with  . Back Pain    (Consider location/radiation/quality/duration/timing/severity/associated sxs/prior treatment) HPI Comments: Patient comes to the ER with complaints of increasing low back pain. Patient has had chronic low back pain, status post surgery in 1992. Patient reports that he reinjured his back in April of last year. He has been under the care of Dr. Shon Baton. He reports that he had an MRI 2 weeks ago and was told that he will need surgery. In the last 2 days he has had increasing pain not controlled by his hydrocodone. Patient also reports that the pain radiates down his left leg. No change in strength or sensation in the leg. No change in bowel or bladder function. Patient denies any new injury.  Patient is a 57 y.o. male presenting with back pain.  Back Pain Associated symptoms: no weakness     Past Medical History  Diagnosis Date  . Peripheral vascular disease   . Hypertension   . Hyperlipidemia   . Pancreatic cancer     surgery 2009  . Anemia, unspecified   . Esophageal reflux   . Helicobacter pylori (H. pylori) infection   . Unspecified hemorrhoids without mention of complication   . Impotence of organic origin   . Other peripheral vascular disease   . Bell's palsy   . Acute myocardial infarction, unspecified site, episode of care unspecified   . Coronary atherosclerosis of unspecified type of vessel, native or graft   . Shortness of breath   . Blood transfusion     during treatment for Ca  . Neuromuscular disorder     h/o bells palsy  . Arthritis     low back & all over     Past Surgical History  Procedure Laterality Date  . Pancreatic  cancer  05/10/2008    Resection of distal panrease and spleen  . Splenectomy    . Coronary artery bypass graft  nov 2011  . Back surgery      1992  .  Cardiac catheterization    . Hemorrhoid surgery  10/30/2011    Procedure: HEMORRHOIDECTOMY;  Surgeon: Shelly Rubenstein, MD;  Location: [facility] OR;  Service: General;  Laterality: N/A;    Family History  Problem Relation Age of Onset  . Heart attack Father     died of MI at age 79  . Cancer Father     pt unaware of what kind  . Anesthesia problems Neg Hx   . Hypotension Neg Hx   . Malignant hyperthermia Neg Hx   . Pseudochol deficiency Neg Hx     History  Substance Use Topics  . Smoking status: Former Smoker    Types: Cigarettes    Quit date: 11/29/2008  . Smokeless tobacco: Never Used  . Alcohol Use: Yes     Comment: Social use/ once per month      Review of Systems  Genitourinary: Negative.   Musculoskeletal: Positive for back pain.  Neurological: Negative for weakness.  All other systems reviewed and are negative.    Allergies  Review of patient's allergies indicates no known allergies.  Home Medications   Current Outpatient Rx  Name  Route  Sig  Dispense  Refill  . aspirin EC 81 MG tablet   Oral   Take 81 mg by mouth daily.           [name]  fish oil-omega-3 fatty acids 1000 MG capsule   Oral   Take 1 capsule (1 g total) by mouth daily.   60 capsule   1   . lisinopril (PRINIVIL,ZESTRIL) 5 MG tablet   Oral   Take 0.5 tablets (2.5 mg total) by mouth daily.   30 tablet   0   . EXPIRED: metoprolol tartrate (LOPRESSOR) 25 MG tablet   Oral   Take 1 tablet (25 mg total) by mouth 2 (two) times daily.   60 tablet   0   . Multiple Vitamin (MULTIVITAMIN) tablet   Oral   Take 1 tablet by mouth daily.   30 tablet   10   . nitroGLYCERIN (NITROSTAT) 0.4 MG SL tablet   Sublingual   Place 1 tablet (0.4 mg total) under the tongue every 5 (five) minutes as needed for chest pain.   15 tablet   0     BP 134/92  Pulse 64  Temp(Src) 97.7 F (36.5 C) (Oral)  Resp 16  SpO2 96%  Physical Exam  Constitutional: He is oriented to person, place, and time. He  appears well-developed and well-nourished. No distress.  HENT:  Head: Normocephalic and atraumatic.  Right Ear: Hearing normal.  Nose: Nose normal.  Mouth/Throat: Oropharynx is clear and moist and mucous membranes are normal.  Eyes: Conjunctivae and EOM are normal. Pupils are equal, round, and reactive to light.  Neck: Normal range of motion. Neck supple.  Cardiovascular: Normal rate, regular rhythm, S1 normal and S2 normal.  Exam reveals no gallop and no friction rub.   No murmur heard. Pulmonary/Chest: Effort normal and breath sounds normal. No respiratory distress. He exhibits no tenderness.  Abdominal: Soft. Normal appearance and bowel sounds are normal. There is no hepatosplenomegaly. There is no tenderness. There is no rebound, no guarding, no tenderness at McBurney's point and negative Murphy's sign. No hernia.  Musculoskeletal: Normal range of motion.  Neurological: He is alert and oriented to person, place, and time. He has normal strength. No cranial nerve deficit or sensory deficit. Coordination normal. GCS eye subscore is 4. GCS verbal subscore is 5. GCS motor subscore is 6.  Reflex Scores:      Patellar reflexes are 1+ on the right side and 1+ on the left side. Skin: Skin is warm, dry and intact. No rash noted. No cyanosis.  Psychiatric: He has a normal mood and affect. His speech is normal and behavior is normal. Thought content normal.    ED Course  Procedures (including critical care time)  Labs Reviewed - No data to display No results found.   Diagnosis: Lumbar radiculopathy    MDM  Patient presents to the ER for evaluation of increased low back pain. He is complaining of pain radiating down his left leg. This is consistent with lumbar radiculopathy. Patient has normal strength and sensation however. He is able to ambulate. No change in bowel or bladder function. No concern for cauda equina syndrome. Patient is under the care of Dr. Shon Baton and apparently is being  evaluated for repeat lumbar surgery. There is nothing to indicate a need for emergent surgery at this time.        Gilda Crease, MD 02/15/13 [phone]

## 2013-02-15 NOTE — ED Notes (Signed)
Pt c/o of of lower back pain that started last year, states that he injured his back 03/03/12. States that when back starts hurting , it hurts the nerves that run down his leg.

## 2013-02-20 ENCOUNTER — Telehealth: Payer: Self-pay | Admitting: Cardiovascular Disease

## 2013-02-20 NOTE — Telephone Encounter (Signed)
Walk In pt Form "Clearance From [facility]" Sent to Message Nurse   02/20/13/KM

## 2013-02-21 ENCOUNTER — Telehealth: Payer: Self-pay | Admitting: Cardiovascular Disease

## 2013-02-21 NOTE — Telephone Encounter (Signed)
New Problem:    Patient called in returning your call. Please call back. 

## 2013-02-21 NOTE — Telephone Encounter (Signed)
N/A.  LMTC. 

## 2013-02-21 NOTE — Telephone Encounter (Signed)
Pt was called by Stanton Kidney, Dr Ludwig Clarks nurse, to let him know that he needs an appt in order to be cleared for surgery. An appt for a surgical clearance was scheduled with Tereso Newcomer, PA-C.  Pt was notified of the appt date and time.

## 2013-03-08 ENCOUNTER — Ambulatory Visit (INDEPENDENT_AMBULATORY_CARE_PROVIDER_SITE_OTHER): Payer: Worker's Compensation | Admitting: Physician Assistant

## 2013-03-08 ENCOUNTER — Encounter: Payer: Self-pay | Admitting: Physician Assistant

## 2013-03-08 VITALS — BP 130/90 | HR 68 | Ht 70.0 in | Wt 210.0 lb

## 2013-03-08 DIAGNOSIS — Z01810 Encounter for preprocedural cardiovascular examination: Secondary | ICD-10-CM

## 2013-03-08 DIAGNOSIS — I2510 Atherosclerotic heart disease of native coronary artery without angina pectoris: Secondary | ICD-10-CM

## 2013-03-08 DIAGNOSIS — I739 Peripheral vascular disease, unspecified: Secondary | ICD-10-CM

## 2013-03-08 DIAGNOSIS — E785 Hyperlipidemia, unspecified: Secondary | ICD-10-CM

## 2013-03-08 DIAGNOSIS — I10 Essential (primary) hypertension: Secondary | ICD-10-CM

## 2013-03-08 MED ORDER — LISINOPRIL 2.5 MG PO TABS: 2.5000 mg | ORAL_TABLET | Freq: Every day | ORAL | Status: DC

## 2013-03-08 MED ORDER — LOVASTATIN 20 MG PO TABS: 20.0000 mg | ORAL_TABLET | Freq: Every day | ORAL | Status: DC

## 2013-03-08 NOTE — Progress Notes (Signed)
[address], [address] [place], [place]  [address] Phone: [phone] Fax:  [phone]  Date:  03/08/2013   ID:  Steven Hale, DOB 10/22/1956, MRN [id number]  PCP:  Yisroel Ramming, MD  Primary Cardiologist:  Dr. Olga Millers   PV:  Dr. Lorine Bears    History of Present Illness: Steven Hale is a 57 y.o. male who returns for surgical clearance.  He needs lumbar spine surgery with Dr. Shon Baton at [facility] orth[facility].  He has a hx of CAD (previously followed at [facility]), s/p multiple interventions in the past ultimately requiring CABG in 09/2010 (LIMA-LAD, SVG-OM1, SVG-distal RCA/OM2), 40% right renal artery stenosis a cardiac catheterization in 2011, PAD, s/p bilateral SFA stents in the past, HTN, HL, pancreatic cancer.  Myoview 05/2012: EF 54%, no ischemia, no scar.  ABIs 11/2012: R 0.99, L 0.86.  Since last seen, he continues to have episodic CP.  He notes increased pressure sensation in his abdomen and epigastrium that radiates up into his chest.  It last just seconds.  He notes it with activity and at rest.  He notes dyspnea with more moderate to extreme activities.  He is probably NYHA Class IIb.  No orthopnea, PND or pedal edema.  He walks on a daily basis without limitations.  His CP is unchanged over the last 2-3 years.    Labs (7/13):  K 4.2, creatinine 1.0, ALT 25  Wt Readings from Last 3 Encounters:  03/08/13 210 lb (95.255 kg)  12/07/12 214 lb (97.07 kg)  06/01/12 206 lb (93.441 kg)     Past Medical History  Diagnosis Date  . PAD (peripheral artery disease)     a. s/p bilat SFA stents;  b. ABIs 11/2012: R 0.99, L 0.86.  [name] Hypertension   . Hyperlipidemia   . Pancreatic cancer     surgery 2009  . Anemia, unspecified   . GERD (gastroesophageal reflux disease)   . Helicobacter pylori (H. pylori) infection   . Hemorrhoids   . Impotence of organic origin   . Bell's palsy   . CAD (coronary artery disease)     a. s/p multiple PCIs;  b. s/p CABG in 09/2010 (LIMA-LAD,  SVG-OM1, SVG-distal RCA/OM2);  c.  Myoview 05/2012: EF 54%, no ischemia, no scar.   . Blood transfusion     during treatment for Ca  . DJD (degenerative joint disease)     low back & all over     Current Outpatient Prescriptions  Medication Sig Dispense Refill  . aspirin EC 81 MG tablet Take 81 mg by mouth daily.        . fish oil-omega-3 fatty acids 1000 MG capsule Take 1 capsule (1 g total) by mouth daily.  60 capsule  1  . Multiple Vitamin (MULTIVITAMIN) tablet Take 1 tablet by mouth daily.  30 tablet  10  . nitroGLYCERIN (NITROSTAT) 0.4 MG SL tablet Place 1 tablet (0.4 mg total) under the tongue every 5 (five) minutes as needed for chest pain.  15 tablet  0  . lisinopril (PRINIVIL,ZESTRIL) 5 MG tablet Take 5 mg by mouth daily.      . metoprolol tartrate (LOPRESSOR) 25 MG tablet Take 1 tablet (25 mg total) by mouth 2 (two) times daily.  60 tablet  0   No current facility-administered medications for this visit.    Allergies:   No Known Allergies  Social History:  The patient  reports that he quit smoking about 4 years ago. His smoking use included  Cigarettes. He smoked 0.00 packs per day. He has never used smokeless tobacco. He reports that  drinks alcohol. He reports that he does not use illicit drugs.   ROS:  Please see the history of present illness. He notes bilateral calf pain with walking about 75-100 feet.  All other systems reviewed and negative.   PHYSICAL EXAM: VS:  BP 130/90  Pulse 68  Ht 5\' 10"  (1.778 m)  Wt 210 lb (95.255 kg)  BMI 30.13 kg/m2 Well nourished, well developed, in no acute distress HEENT: normal Neck: no JVD Vascular: no carotid bruits Cardiac:  normal S1, S2; RRR; no murmur Lungs:  clear to auscultation bilaterally, no wheezing, rhonchi or rales Abd: soft, nontender, no hepatomegaly Ext: no edema Skin: warm and dry Neuro:  CNs 2-12 intact, no focal abnormalities noted  EKG:  NSR, HR 68, normal axis, no acute changes.     ASSESSMENT AND  PLAN:  1. Surgical Clearance:  He does not have any unstable cardiac conditions.  He had CABG in 2011.  He had a low risk myoview less than 12 mos ago.  He has chronic CP without change that does not sound ischemia.  ECG is normal.  According to [facility] and AHA guidelines, he requires no further cardiac workup before his non-cardiac surgery.  He is at acceptable risk.  Continue ASA with prior hx of PCI prior to this CABG only if felt to be safe by the surgeon.  He should remain on his beta blocker throughout to reduce the risk of CV complications.  Our service is available as necessary. 2. CAD:  Atypical CP.  No further workup.  Last myoview in 7/13 low risk.  Continue ASA. Restart statin.   3. Hypertension:  Restart Lisinopril 2.5 mg QD.  He has been off of this for some time.  Check BMET in 2 weeks. 4. Hyperlipidemia:  He is off statin for unclear reasons.  Start Lovastatin 20 mg QHS.  Check Lipids and LFTs in 6 weeks.   5. PAD:  He has stable claudication symptoms.  Recent ABIs stable.  F/u with Dr. Lorine Bears as planned. 6. Disposition:  F/u with Dr. Olga Millers as planned.   Signed, Tereso Newcomer, PA-C  9:25 AM 03/08/2013

## 2013-03-08 NOTE — Patient Instructions (Addendum)
PLEASE FOLLOW UP WITH DR. CRENSHAW IN THE NEXT 2-3 MONTHS  START LOVASTATIN 20 MG 1 TABLET EVERY NIGHT AT BEDTIME  REFILL FOR LISINOPRIL 2.5 MG WAS SENT IN TODAY AS WELL  LABS IN 2 WEEKS; BMET  LABS IN 8 WEEKS; FASTING LIPID AND LIVER

## 2013-03-22 ENCOUNTER — Other Ambulatory Visit: Payer: Self-pay

## 2013-03-28 ENCOUNTER — Encounter (HOSPITAL_COMMUNITY): Payer: Self-pay | Admitting: Family Medicine

## 2013-03-28 ENCOUNTER — Emergency Department (HOSPITAL_COMMUNITY)
Admission: EM | Admit: 2013-03-28 | Discharge: 2013-03-28 | Disposition: A | Discharge status: Home / Self Care | Payer: No Typology Code available for payment source | Attending: Emergency Medicine | Admitting: Emergency Medicine

## 2013-03-28 DIAGNOSIS — Z951 Presence of aortocoronary bypass graft: Secondary | ICD-10-CM | POA: Insufficient documentation

## 2013-03-28 DIAGNOSIS — Z9889 Other specified postprocedural states: Secondary | ICD-10-CM | POA: Insufficient documentation

## 2013-03-28 DIAGNOSIS — Z87891 Personal history of nicotine dependence: Secondary | ICD-10-CM | POA: Insufficient documentation

## 2013-03-28 DIAGNOSIS — Z79899 Other long term (current) drug therapy: Secondary | ICD-10-CM | POA: Insufficient documentation

## 2013-03-28 DIAGNOSIS — Z7982 Long term (current) use of aspirin: Secondary | ICD-10-CM | POA: Insufficient documentation

## 2013-03-28 DIAGNOSIS — Z8739 Personal history of other diseases of the musculoskeletal system and connective tissue: Secondary | ICD-10-CM | POA: Insufficient documentation

## 2013-03-28 DIAGNOSIS — Z8679 Personal history of other diseases of the circulatory system: Secondary | ICD-10-CM | POA: Insufficient documentation

## 2013-03-28 DIAGNOSIS — I10 Essential (primary) hypertension: Secondary | ICD-10-CM | POA: Insufficient documentation

## 2013-03-28 DIAGNOSIS — L03019 Cellulitis of unspecified finger: Secondary | ICD-10-CM | POA: Insufficient documentation

## 2013-03-28 DIAGNOSIS — E785 Hyperlipidemia, unspecified: Secondary | ICD-10-CM | POA: Insufficient documentation

## 2013-03-28 DIAGNOSIS — Z8509 Personal history of malignant neoplasm of other digestive organs: Secondary | ICD-10-CM | POA: Insufficient documentation

## 2013-03-28 DIAGNOSIS — IMO0001 Reserved for inherently not codable concepts without codable children: Secondary | ICD-10-CM

## 2013-03-28 DIAGNOSIS — M7989 Other specified soft tissue disorders: Secondary | ICD-10-CM | POA: Insufficient documentation

## 2013-03-28 DIAGNOSIS — Z8619 Personal history of other infectious and parasitic diseases: Secondary | ICD-10-CM | POA: Insufficient documentation

## 2013-03-28 DIAGNOSIS — Z87448 Personal history of other diseases of urinary system: Secondary | ICD-10-CM | POA: Insufficient documentation

## 2013-03-28 DIAGNOSIS — Z8719 Personal history of other diseases of the digestive system: Secondary | ICD-10-CM | POA: Insufficient documentation

## 2013-03-28 DIAGNOSIS — I739 Peripheral vascular disease, unspecified: Secondary | ICD-10-CM | POA: Insufficient documentation

## 2013-03-28 DIAGNOSIS — Z8669 Personal history of other diseases of the nervous system and sense organs: Secondary | ICD-10-CM | POA: Insufficient documentation

## 2013-03-28 DIAGNOSIS — I2510 Atherosclerotic heart disease of native coronary artery without angina pectoris: Secondary | ICD-10-CM | POA: Insufficient documentation

## 2013-03-28 DIAGNOSIS — Z862 Personal history of diseases of the blood and blood-forming organs and certain disorders involving the immune mechanism: Secondary | ICD-10-CM | POA: Insufficient documentation

## 2013-03-28 MED ORDER — EPINEPHRINE HCL 1 MG/ML IJ SOLN
INTRAMUSCULAR | Status: AC
  Filled 2013-03-28: qty 1

## 2013-03-28 MED ORDER — HYDROCODONE-ACETAMINOPHEN 5-325 MG PO TABS: 2.0000 | ORAL_TABLET | ORAL | Status: DC | PRN

## 2013-03-28 MED ORDER — WHITE PETROLATUM GEL
Status: AC
  Administered 2013-03-28: 08:00:00
  Filled 2013-03-28: qty 5

## 2013-03-28 MED ORDER — EPINEPHRINE HCL 0.1 MG/ML IJ SOSY
PREFILLED_SYRINGE | INTRAMUSCULAR | Status: AC
  Filled 2013-03-28: qty 10

## 2013-03-28 NOTE — ED Provider Notes (Signed)
History     CSN: [id number]  Arrival date & time 03/28/13  4098   First MD Initiated Contact with Patient 03/28/13 [phone]      Chief Complaint  Patient presents with  . Hand Pain    (Consider location/radiation/quality/duration/timing/severity/associated sxs/prior treatment) Patient is a 57 y.o. male presenting with hand pain.  Hand Pain   Complains of pain at right ring finger for the past 4 days after he pulled a hangnail from his ring finger 5 days ago. Since pulling a hangnail he's developed pain and swelling around the fingernail and cuticle of the right ring finger pain is worse with movement or palpation not improved by anything no treatment prior to coming here. No other associated symptoms Past Medical History  Diagnosis Date  . PAD (peripheral artery disease)     a. s/p bilat SFA stents;  b. ABIs 11/2012: R 0.99, L 0.86.  [name] Hypertension   . Hyperlipidemia   . Pancreatic cancer     surgery 2009  . Anemia, unspecified   . GERD (gastroesophageal reflux disease)   . Helicobacter pylori (H. pylori) infection   . Hemorrhoids   . Impotence of organic origin   . Bell's palsy   . CAD (coronary artery disease)     a. s/p multiple PCIs;  b. s/p CABG in 09/2010 (LIMA-LAD, SVG-OM1, SVG-distal RCA/OM2);  c.  Myoview 05/2012: EF 54%, no ischemia, no scar.   . Blood transfusion     during treatment for Ca  . DJD (degenerative joint disease)     low back & all over     Past Surgical History  Procedure Laterality Date  . Pancreatic  cancer  05/10/2008    Resection of distal panrease and spleen  . Splenectomy    . Coronary artery bypass graft  nov 2011  . Back surgery      1992  . Cardiac catheterization    . Hemorrhoid surgery  10/30/2011    Procedure: HEMORRHOIDECTOMY;  Surgeon: Shelly Rubenstein, MD;  Location: [facility] OR;  Service: General;  Laterality: N/A;    Family History  Problem Relation Age of Onset  . Heart attack Father     died of MI at age 52  . Cancer Father      pt unaware of what kind  . Anesthesia problems Neg Hx   . Hypotension Neg Hx   . Malignant hyperthermia Neg Hx   . Pseudochol deficiency Neg Hx     History  Substance Use Topics  . Smoking status: Former Smoker    Types: Cigarettes    Quit date: 11/29/2008  . Smokeless tobacco: Never Used  . Alcohol Use: 1.2 oz/week    2 Glasses of wine per week     Comment: Once a week       Review of Systems  Constitutional: Negative.   Skin: Positive for wound.       Pain and swelling at right ring finger    Allergies  Review of patient's allergies indicates no known allergies.  Home Medications   Current Outpatient Rx  Name  Route  Sig  Dispense  Refill  . aspirin EC 81 MG tablet   Oral   Take 81 mg by mouth daily.           . fish oil-omega-3 fatty acids 1000 MG capsule   Oral   Take 1 capsule (1 g total) by mouth daily.   60 capsule   1   . lisinopril (  PRINIVIL,ZESTRIL) 2.5 MG tablet   Oral   Take 1 tablet (2.5 mg total) by mouth daily.   90 tablet   3   . lovastatin (MEVACOR) 20 MG tablet   Oral   Take 1 tablet (20 mg total) by mouth at bedtime.   90 tablet   3   . EXPIRED: metoprolol tartrate (LOPRESSOR) 25 MG tablet   Oral   Take 1 tablet (25 mg total) by mouth 2 (two) times daily.   60 tablet   0   . Multiple Vitamin (MULTIVITAMIN) tablet   Oral   Take 1 tablet by mouth daily.   30 tablet   10   . nitroGLYCERIN (NITROSTAT) 0.4 MG SL tablet   Sublingual   Place 1 tablet (0.4 mg total) under the tongue every 5 (five) minutes as needed for chest pain.   15 tablet   0     BP 146/99  Pulse 82  Temp(Src) 98.3 F (36.8 C) (Oral)  Resp 20  Ht 5\' 10"  (1.778 m)  Wt 210 lb (95.255 kg)  BMI 30.13 kg/m2  SpO2 98%  Physical Exam  Constitutional: He appears well-developed and well-nourished. No distress.  HENT:  Head: Normocephalic and atraumatic.  Neck: Neck supple.  Cardiovascular: Normal rate.   Pulmonary/Chest: Effort normal.  Abdominal:  He exhibits no distension.  Musculoskeletal: Normal range of motion.  Right upper extremity ring finger with paronychia at all her aspect of the nail margin. With corresponding tenderness full range of motion good capillary refill no red streaks proximally. All extremities without contusion abrasion or tenderness neurovascularly intact    ED Course  Procedures (including critical care time)  Labs Reviewed - No data to display No results found.   No diagnosis found.  Procedure paronychia right ring finger was incised and drained by me. Timeout was performed. Digital block was performed using 1% lidocaine without epinephrine, 2 mL with good anesthesia. An incision was made at the ulnar aspect of the nail margin of the right ring finger draining a moderate amount of frank pus. The wound was opened further with a hemostat small mildly drainage resulting. Finger placed in bulky dressing. Patient tolerated procedure MDM  Plan prescription Norco Wound check 2 days. Blood pressure recheck 3 weeks Diagnosis#1 paronychia right ring finger #2 hypertension        Doug Sou, MD 03/28/13 [phone]

## 2013-03-28 NOTE — ED Notes (Signed)
Patient states that he had a hangnail last week that he pulled. States that since he pulled the hangnail his finger has been sore and has gotten worse and it has been painful and throbbing. Edema noted area nailbed of right ring finger. Warm and tender to touch.

## 2013-04-17 ENCOUNTER — Encounter: Payer: Self-pay | Admitting: Family Medicine

## 2013-04-17 ENCOUNTER — Ambulatory Visit: Payer: No Typology Code available for payment source | Attending: Family Medicine | Admitting: Family Medicine

## 2013-04-17 VITALS — BP 147/95 | HR 69 | Temp 98.1°F | Resp 18 | Wt 212.0 lb

## 2013-04-17 DIAGNOSIS — G8929 Other chronic pain: Secondary | ICD-10-CM

## 2013-04-17 DIAGNOSIS — R109 Unspecified abdominal pain: Secondary | ICD-10-CM | POA: Insufficient documentation

## 2013-04-17 DIAGNOSIS — I10 Essential (primary) hypertension: Secondary | ICD-10-CM

## 2013-04-17 DIAGNOSIS — M25569 Pain in unspecified knee: Secondary | ICD-10-CM

## 2013-04-17 LAB — POCT URINALYSIS DIPSTICK
Blood, UA: NEGATIVE
Glucose, UA: NEGATIVE
Spec Grav, UA: 1.015
Urobilinogen, UA: 0.2
pH, UA: 5.5

## 2013-04-17 LAB — COMPREHENSIVE METABOLIC PANEL
Albumin: 4.8 g/dL (ref 3.5–5.2)
Alkaline Phosphatase: 72 U/L (ref 39–117)
BUN: 17 mg/dL (ref 6–23)
CO2: 25 mEq/L (ref 19–32)
Calcium: 10.2 mg/dL (ref 8.4–10.5)
Chloride: 99 mEq/L (ref 96–112)
Glucose, Bld: 107 mg/dL — ABNORMAL HIGH (ref 70–99)
Potassium: 4.9 mEq/L (ref 3.5–5.3)
Sodium: 136 mEq/L (ref 135–145)
Total Protein: 8.4 g/dL — ABNORMAL HIGH (ref 6.0–8.3)

## 2013-04-17 LAB — CBC WITH DIFFERENTIAL/PLATELET
HCT: 38.9 % — ABNORMAL LOW (ref 39.0–52.0)
Hemoglobin: 13.4 g/dL (ref 13.0–17.0)
Lymphocytes Relative: 47 % — ABNORMAL HIGH (ref 12–46)
Lymphs Abs: 2.7 10*3/uL (ref 0.7–4.0)
Monocytes Absolute: 0.6 10*3/uL (ref 0.1–1.0)
Monocytes Relative: 11 % (ref 3–12)
Neutro Abs: 2.3 10*3/uL (ref 1.7–7.7)
Neutrophils Relative %: 38 % — ABNORMAL LOW (ref 43–77)
RBC: 4.42 MIL/uL (ref 4.22–5.81)

## 2013-04-17 LAB — HEMOGLOBIN A1C: Hgb A1c MFr Bld: 6 % — ABNORMAL HIGH (ref <5.7)

## 2013-04-17 LAB — TSH: TSH: 1.666 u[IU]/mL (ref 0.350–4.500)

## 2013-04-17 MED ORDER — METOPROLOL TARTRATE 25 MG PO TABS: 25.0000 mg | ORAL_TABLET | Freq: Two times a day (BID) | ORAL | Status: DC

## 2013-04-17 NOTE — Progress Notes (Signed)
Patient has a history of HTN and heart surgery Here for medication refills

## 2013-04-17 NOTE — Progress Notes (Signed)
Subjective:     Patient ID: Steven Hale, male   DOB: 27-Apr-1956, 57 y.o.   MRN: [id number]  HPI Pt here for med refill as well as knee and abd pain.   1 - med refill - has been out of metoprolol for a month. Has all other meds. Asymptomatic today - no cp, shob, headache.   2 - knee pain - chronic, h/o knee surgery. He has had ant thigh "cramping" recently and thinks his knee discomfort is the quads pain radiating down to knee. No new trauma. Able to ambulate well.   3 - Abd pain - feels his lower and mid abdomen "knot up" periodically over the past several months. Yesterday it felt a bit worse than in the past. 5/10 in severity, crampy, no nausea, vomiting, diarrhea. Nonradiating.    Review of Systems as per hpi     Objective:   Physical Exam  Constitutional: He appears well-developed and well-nourished.  Cardiovascular: Normal rate and regular rhythm.   Pulmonary/Chest: Effort normal and breath sounds normal.  Abdominal: Soft. Bowel sounds are normal. He exhibits no distension and no mass. There is no tenderness. There is no rebound and no guarding.  Neg murphys neg mcburneys  Skin: Skin is warm and dry.  Psychiatric: He has a normal mood and affect.       Assessment:     Essential hypertension, benign - Plan: metoprolol tartrate (LOPRESSOR) 25 MG tablet, CBC with Differential, Comprehensive metabolic panel, TSH, Hemoglobin A1c, Urinalysis Dipstick  Abdominal  pain, other specified site - Plan: Urinalysis Dipstick  Knee pain, chronic, right       Plan:     HTN - refilled medication. HR 69 today, have asked him to keep an eye on this. If he feels lightheaded or unwell in any way he should be seen.   abd pain - exam wnl, will check labs and see him at f/u.   Knee pain - will see if electrolytes are ok and see pt in f/u in a ocuple weeks. If knee continues to other him would need to refer back to ortho  F/u 1-2 weeks, earlier if needed. Call with questions or concerns.

## 2013-04-17 NOTE — Patient Instructions (Signed)

## 2013-04-18 ENCOUNTER — Encounter: Payer: Self-pay | Admitting: Family Medicine

## 2013-05-01 ENCOUNTER — Ambulatory Visit: Payer: Self-pay

## 2013-05-03 ENCOUNTER — Other Ambulatory Visit (INDEPENDENT_AMBULATORY_CARE_PROVIDER_SITE_OTHER): Payer: No Typology Code available for payment source

## 2013-05-03 DIAGNOSIS — E785 Hyperlipidemia, unspecified: Secondary | ICD-10-CM

## 2013-05-03 LAB — LIPID PANEL
Total CHOL/HDL Ratio: 4
Triglycerides: 112 mg/dL (ref 0.0–149.0)

## 2013-05-03 LAB — LDL CHOLESTEROL, DIRECT: Direct LDL: 146.8 mg/dL

## 2013-05-05 ENCOUNTER — Telehealth: Payer: Self-pay | Admitting: *Deleted

## 2013-05-05 DIAGNOSIS — E785 Hyperlipidemia, unspecified: Secondary | ICD-10-CM

## 2013-05-05 MED ORDER — LOVASTATIN 40 MG PO TABS: 40.0000 mg | ORAL_TABLET | Freq: Every day | ORAL | Status: DC

## 2013-05-05 NOTE — Telephone Encounter (Signed)
lmom per Bing Neighbors. PA to re-start lovastatin 40 mg qhs. FLP/LFT 08/15/13, rx sent to [facility] today

## 2013-05-05 NOTE — Telephone Encounter (Signed)
Message copied by Tarri Fuller on Fri May 05, 2013 10:59 AM ------      Message from: [place], [place] T      Created: Wed May 03, 2013  9:54 PM       LDL too high      Increase Lovastatin to 40 mg QD      Check FLP and LFTs in 3 mos      Tereso Newcomer, [place]        05/03/2013 9:54 PM ------

## 2013-05-05 NOTE — Telephone Encounter (Signed)
pt notified about lab results and when I said to increase lovastatin pt states he has not been on since HS closed almost 1 yr he says. I advised I d/w PA 1st and cb with recommendation if still lovastatin 40 , L/L 3 months

## 2013-05-15 ENCOUNTER — Ambulatory Visit (INDEPENDENT_AMBULATORY_CARE_PROVIDER_SITE_OTHER): Payer: Medicaid Other | Admitting: Cardiology

## 2013-05-15 ENCOUNTER — Encounter: Payer: Self-pay | Admitting: Cardiology

## 2013-05-15 VITALS — BP 137/88 | HR 61 | Wt 215.0 lb

## 2013-05-15 DIAGNOSIS — I2510 Atherosclerotic heart disease of native coronary artery without angina pectoris: Secondary | ICD-10-CM

## 2013-05-15 DIAGNOSIS — E785 Hyperlipidemia, unspecified: Secondary | ICD-10-CM

## 2013-05-15 DIAGNOSIS — I7389 Other specified peripheral vascular diseases: Secondary | ICD-10-CM

## 2013-05-15 DIAGNOSIS — I10 Essential (primary) hypertension: Secondary | ICD-10-CM

## 2013-05-15 DIAGNOSIS — Z01810 Encounter for preprocedural cardiovascular examination: Secondary | ICD-10-CM

## 2013-05-15 NOTE — Assessment & Plan Note (Signed)
Patient tentatively plans back surgery following consultation in [place]. From a cardiac standpoint he may proceed without further testing. Recent nuclear study showed no ischemia.

## 2013-05-15 NOTE — Assessment & Plan Note (Signed)
Continue statin. Recently increased dose. Await followup lipids and liver.

## 2013-05-15 NOTE — Assessment & Plan Note (Signed)
Continue present blood pressure medications. 

## 2013-05-15 NOTE — Assessment & Plan Note (Signed)
Continue aspirin and statin. Last Myoview showed no ischemia.

## 2013-05-15 NOTE — Patient Instructions (Addendum)
Your physician wants you to follow-up in: ONE YEAR WITH DR CRENSHAW You will receive a reminder letter in the mail two months in advance. If you don't receive a letter, please call our office to schedule the follow-up appointment.  

## 2013-05-15 NOTE — Progress Notes (Signed)
HPI: Pleasant male for fu of CAD. He has a hx of CAD (previously followed at [facility]), s/p multiple interventions in the past ultimately requiring CABG in 09/2010 (LIMA-LAD, SVG-OM1, SVG-distal RCA/OM2), 40% right renal artery stenosis a cardiac catheterization in 2011, PAD, s/p bilateral SFA stents in the past, HTN, HL, pancreatic cancer. Myoview 05/2012: EF 54%, no ischemia, no scar. ABIs 11/2012: R 0.99, L 0.86. Since last seen, he occasionally has dyspnea but denies chest pain. No syncope.   Current Outpatient Prescriptions  Medication Sig Dispense Refill  . acetaminophen (TYLENOL) 500 MG tablet Take 500 mg by mouth every 6 (six) hours as needed for pain.      [name] aspirin EC 81 MG tablet Take 81 mg by mouth daily.        . fish oil-omega-3 fatty acids 1000 MG capsule Take 1 capsule (1 g total) by mouth daily.  60 capsule  1  . lisinopril (PRINIVIL,ZESTRIL) 2.5 MG tablet Take 1 tablet (2.5 mg total) by mouth daily.  90 tablet  3  . lovastatin (MEVACOR) 20 MG tablet Take 20 mg by mouth at bedtime.      . lovastatin (MEVACOR) 40 MG tablet Take 1 tablet (40 mg total) by mouth at bedtime.  90 tablet  3  . metoprolol tartrate (LOPRESSOR) 25 MG tablet Take 1 tablet (25 mg total) by mouth 2 (two) times daily.  60 tablet  3  . Multiple Vitamin (MULTIVITAMIN) tablet Take 1 tablet by mouth daily.  30 tablet  10  . Naproxen Sodium (ALEVE) 220 MG CAPS Take 220 mg by mouth every 6 (six) hours as needed.      . nitroGLYCERIN (NITROSTAT) 0.4 MG SL tablet Place 1 tablet (0.4 mg total) under the tongue every 5 (five) minutes as needed for chest pain.  15 tablet  0   No current facility-administered medications for this visit.     Past Medical History  Diagnosis Date  . PAD (peripheral artery disease)     a. s/p bilat SFA stents;  b. ABIs 11/2012: R 0.99, L 0.86.  [name] Hypertension   . Hyperlipidemia   . Pancreatic cancer     surgery 2009  . Anemia, unspecified   . GERD (gastroesophageal reflux disease)   .  Helicobacter pylori (H. pylori) infection   . Hemorrhoids   . Impotence of organic origin   . Bell's palsy   . CAD (coronary artery disease)     a. s/p multiple PCIs;  b. s/p CABG in 09/2010 (LIMA-LAD, SVG-OM1, SVG-distal RCA/OM2);  c.  Myoview 05/2012: EF 54%, no ischemia, no scar.   . Blood transfusion     during treatment for Ca  . DJD (degenerative joint disease)     low back & all over     Past Surgical History  Procedure Laterality Date  . Pancreatic  cancer  05/10/2008    Resection of distal panrease and spleen  . Splenectomy    . Coronary artery bypass graft  nov 2011  . Back surgery      1992  . Cardiac catheterization    . Hemorrhoid surgery  10/30/2011    Procedure: HEMORRHOIDECTOMY;  Surgeon: Shelly Rubenstein, MD;  Location: [facility] OR;  Service: General;  Laterality: N/A;    History   Social History  . Marital Status: Single    Spouse Name: N/A    Number of Children: 0  . Years of Education: N/A   Occupational History  .  Social History Main Topics  . Smoking status: Former Smoker    Types: Cigarettes    Quit date: 11/29/2008  . Smokeless tobacco: Never Used  . Alcohol Use: 1.2 oz/week    2 Glasses of wine per week     Comment: Once a week   . Drug Use: No  . Sexually Active: Not on file   Other Topics Concern  . 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 Remaining systems are negative.  Physical Exam: Well-developed well-nourished in no acute distress.  Skin is warm and dry.  HEENT is normal.  Neck is supple.  Chest is clear to auscultation with normal expansion.  Cardiovascular exam is regular rate and rhythm.  Abdominal exam nontender or distended. No  masses palpated. Extremities show no edema. neuro grossly intact  ECG NSR, no ST changes

## 2013-05-15 NOTE — Assessment & Plan Note (Signed)
Continue aspirin and statin. 

## 2013-05-22 ENCOUNTER — Telehealth: Payer: Self-pay | Admitting: *Deleted

## 2013-05-22 ENCOUNTER — Other Ambulatory Visit (INDEPENDENT_AMBULATORY_CARE_PROVIDER_SITE_OTHER): Payer: Medicaid Other

## 2013-05-22 DIAGNOSIS — I10 Essential (primary) hypertension: Secondary | ICD-10-CM

## 2013-05-22 LAB — BASIC METABOLIC PANEL
Calcium: 10 mg/dL (ref 8.4–10.5)
Chloride: 104 mEq/L (ref 96–112)
Creatinine, Ser: 1 mg/dL (ref 0.4–1.5)
Sodium: 139 mEq/L (ref 135–145)

## 2013-05-22 NOTE — Telephone Encounter (Signed)
pt notified about lab results and recommendation to f/u w/PCP about elevated glucose. pt advised to watch carbs in his diet and try to eat more complex carbs, pt said ok

## 2013-05-22 NOTE — Telephone Encounter (Signed)
Message copied by Tarri Fuller on Mon May 22, 2013  5:46 PM ------      Message from: [place], [place] T      Created: Mon May 22, 2013  4:54 PM       Potassium and kidney function ok      Continue with current treatment plan.      F/u with PCP for high sugar      Tereso Newcomer, PA-C        05/22/2013 4:53 PM ------

## 2013-08-14 ENCOUNTER — Telehealth: Payer: Self-pay | Admitting: Emergency Medicine

## 2013-08-14 NOTE — Telephone Encounter (Signed)
Pt came in person requesting lab results from 05/03/13.  Results given. Pt has scheduled appt @ [facility]

## 2013-08-15 ENCOUNTER — Other Ambulatory Visit (INDEPENDENT_AMBULATORY_CARE_PROVIDER_SITE_OTHER): Payer: Medicaid Other

## 2013-08-15 ENCOUNTER — Telehealth: Payer: Self-pay | Admitting: Emergency Medicine

## 2013-08-15 DIAGNOSIS — E785 Hyperlipidemia, unspecified: Secondary | ICD-10-CM

## 2013-08-15 LAB — LIPID PANEL
HDL: 53.9 mg/dL (ref >39.00)
LDL Cholesterol: 96 mg/dL (ref 0–99)
Total CHOL/HDL Ratio: 4
Triglycerides: 197 mg/dL — ABNORMAL HIGH (ref 0.0–149.0)

## 2013-08-15 LAB — HEPATIC FUNCTION PANEL
ALT: 28 U/L (ref 0–53)
Bilirubin, Direct: 0 mg/dL (ref 0.0–0.3)
Total Bilirubin: 0.7 mg/dL (ref 0.3–1.2)

## 2013-08-16 HISTORY — PX: PERIPHERAL VASCULAR CATHETERIZATION: SHX172C

## 2013-08-22 ENCOUNTER — Ambulatory Visit: Payer: Medicaid Other | Attending: Internal Medicine

## 2013-08-30 ENCOUNTER — Ambulatory Visit: Payer: Medicare Other | Attending: Internal Medicine | Admitting: Internal Medicine

## 2013-08-30 VITALS — BP 134/80 | HR 99 | Temp 99.3°F | Resp 17 | Wt 220.4 lb

## 2013-08-30 DIAGNOSIS — H8110 Benign paroxysmal vertigo, unspecified ear: Secondary | ICD-10-CM | POA: Insufficient documentation

## 2013-08-30 DIAGNOSIS — E119 Type 2 diabetes mellitus without complications: Secondary | ICD-10-CM | POA: Insufficient documentation

## 2013-08-30 DIAGNOSIS — I7389 Other specified peripheral vascular diseases: Secondary | ICD-10-CM

## 2013-08-30 DIAGNOSIS — K219 Gastro-esophageal reflux disease without esophagitis: Secondary | ICD-10-CM

## 2013-08-30 DIAGNOSIS — R7309 Other abnormal glucose: Secondary | ICD-10-CM | POA: Insufficient documentation

## 2013-08-30 DIAGNOSIS — N529 Male erectile dysfunction, unspecified: Secondary | ICD-10-CM | POA: Insufficient documentation

## 2013-08-30 DIAGNOSIS — I10 Essential (primary) hypertension: Secondary | ICD-10-CM | POA: Insufficient documentation

## 2013-08-30 DIAGNOSIS — E7800 Pure hypercholesterolemia, unspecified: Secondary | ICD-10-CM

## 2013-08-30 DIAGNOSIS — Z23 Encounter for immunization: Secondary | ICD-10-CM

## 2013-08-30 DIAGNOSIS — R42 Dizziness and giddiness: Secondary | ICD-10-CM | POA: Insufficient documentation

## 2013-08-30 DIAGNOSIS — I2510 Atherosclerotic heart disease of native coronary artery without angina pectoris: Secondary | ICD-10-CM

## 2013-08-30 DIAGNOSIS — I219 Acute myocardial infarction, unspecified: Secondary | ICD-10-CM

## 2013-08-30 DIAGNOSIS — E291 Testicular hypofunction: Secondary | ICD-10-CM

## 2013-08-30 LAB — POCT GLYCOSYLATED HEMOGLOBIN (HGB A1C): Hemoglobin A1C: 5.8

## 2013-08-30 MED ORDER — SILDENAFIL CITRATE 100 MG PO TABS: 50.0000 mg | ORAL_TABLET | Freq: Every day | ORAL | Status: DC | PRN

## 2013-08-30 MED ORDER — MECLIZINE HCL 12.5 MG PO TABS: 12.5000 mg | ORAL_TABLET | Freq: Three times a day (TID) | ORAL | Status: DC | PRN

## 2013-08-30 NOTE — Progress Notes (Signed)
Patient ID: Steven Hale, male   DOB: 1956-05-10, 57 y.o.   MRN: [id number] Patient Demographics  Steven Hale, is a 57 y.o. male  [id number]  [id number]  DOB - 19-Nov-1955  Chief Complaint  Patient presents with  . Follow-up        Subjective:   Steven Hale today is here for a follow up visit.  Is a 57 year old male with history of coronary artery disease, stent placement in 2005, peripheral vascular disease, borderline diabetes, hemoglobin A1c 6, hyperlipidemia presented for followup. Patient requested results for testosterone, reviewed all the results are dated back in 2014 this year, couldn't find testosterone levels. Patient reports Erectile dysfunction, he denies any chest pains. No anginal symptoms. He did have an episode of vertigo with congestion early this week. Patient has No headache, No chest pain, No abdominal pain - No Nausea, No new weakness tingling or numbness, No Cough - SOB  Objective:    Filed Vitals:   08/30/13 1429  BP: 134/80  Pulse: 99  Temp: 99.3 F (37.4 C)  Resp: 17  Weight: 220 lb 6.4 oz (99.973 kg)  SpO2: 100%     ALLERGIES:  No Known Allergies  PAST MEDICAL HISTORY: Past Medical History  Diagnosis Date  . PAD (peripheral artery disease)     a. s/p bilat SFA stents;  b. ABIs 11/2012: R 0.99, L 0.86.  [name] Hypertension   . Hyperlipidemia   . Pancreatic cancer     surgery 2009  . Anemia, unspecified   . GERD (gastroesophageal reflux disease)   . Helicobacter pylori (H. pylori) infection   . Hemorrhoids   . Impotence of organic origin   . Bell's palsy   . CAD (coronary artery disease)     a. s/p multiple PCIs;  b. s/p CABG in 09/2010 (LIMA-LAD, SVG-OM1, SVG-distal RCA/OM2);  c.  Myoview 05/2012: EF 54%, no ischemia, no scar.   . Blood transfusion     during treatment for Ca  . DJD (degenerative joint disease)     low back & all over     MEDICATIONS AT HOME: Prior to Admission medications   Medication Sig Start Date End  Date Taking? Authorizing Provider  acetaminophen (TYLENOL) 500 MG tablet Take 500 mg by mouth every 6 (six) hours as needed for pain.    Historical Provider, MD  aspirin EC 81 MG tablet Take 81 mg by mouth daily.      Historical Provider, MD  fish oil-omega-3 fatty acids 1000 MG capsule Take 1 capsule (1 g total) by mouth daily. 11/30/12   Jimmie Molly, MD  lisinopril (PRINIVIL,ZESTRIL) 2.5 MG tablet Take 1 tablet (2.5 mg total) by mouth daily. 03/08/13   Beatrice Lecher, PA-C  lovastatin (MEVACOR) 20 MG tablet Take 20 mg by mouth at bedtime.    Historical Provider, MD  lovastatin (MEVACOR) 40 MG tablet Take 1 tablet (40 mg total) by mouth at bedtime. 05/05/13   Beatrice Lecher, PA-C  meclizine (ANTIVERT) 12.5 MG tablet Take 1 tablet (12.5 mg total) by mouth 3 (three) times daily as needed for dizziness (also available over the counter). 08/30/13   Kinjal Neitzke Jenna Luo, MD  metoprolol tartrate (LOPRESSOR) 25 MG tablet Take 1 tablet (25 mg total) by mouth 2 (two) times daily. 04/17/13 08/13/13  Acey Lav, MD  Multiple Vitamin (MULTIVITAMIN) tablet Take 1 tablet by mouth daily. 11/30/12   Jimmie Molly, MD  Naproxen Sodium (ALEVE) 220 MG CAPS Take 220 mg by mouth every 6 (six)  hours as needed.    Historical Provider, MD  nitroGLYCERIN (NITROSTAT) 0.4 MG SL tablet Place 1 tablet (0.4 mg total) under the tongue every 5 (five) minutes as needed for chest pain. 11/30/12   Jimmie Molly, MD  sildenafil (VIAGRA) 100 MG tablet Take 0.5-1 tablets (50-100 mg total) by mouth daily as needed for erectile dysfunction. 08/30/13   Yitta Gongaware Jenna Luo, MD     Exam  General appearance :Awake, alert, NAD, Speech Clear.  HEENT: Atraumatic and Normocephalic, PERLA Neck: supple, no JVD. No cervical lymphadenopathy.  Chest: Clear to auscultation bilaterally, no wheezing, rales or rhonchi CVS: S1 S2 regular, no murmurs.  Abdomen: soft, NBS, NT, ND, no gaurding, rigidity or rebound. Extremities: no cyanosis or clubbing, B/L Lower Ext shows  no edema Neurology: Awake alert, and oriented X 3, CN II-XII intact, Non focal Skin: No Rash or lesions Wounds:N/A    Data Review   Basic Metabolic Panel: No results found for this basename: NA, K, CL, CO2, GLUCOSE, BUN, CREATININE, CALCIUM, MG, PHOS,  in the last 168 hours Liver Function Tests: No results found for this basename: AST, ALT, ALKPHOS, BILITOT, PROT, ALBUMIN,  in the last 168 hours  CBC: No results found for this basename: WBC, NEUTROABS, HGB, HCT, MCV, PLT,  in the last 168 hours  ------------------------------------------------------------------------------------------------------------------ No results found for this basename: HGBA1C,  in the last 72 hours ------------------------------------------------------------------------------------------------------------------ No results found for this basename: CHOL, HDL, LDLCALC, TRIG, CHOLHDL, LDLDIRECT,  in the last 72 hours ------------------------------------------------------------------------------------------------------------------ No results found for this basename: TSH, T4TOTAL, FREET3, T3FREE, THYROIDAB,  in the last 72 hours ------------------------------------------------------------------------------------------------------------------ No results found for this basename: VITAMINB12, FOLATE, FERRITIN, TIBC, IRON, RETICCTPCT,  in the last 72 hours  Coagulation profile  No results found for this basename: INR, PROTIME,  in the last 168 hours    Assessment & Plan   Active Problems: HTN: Stable, patient reports that he has refills on lisinopril  Vertigo: Improved, gave prescription of meclizine as needed also available over-the-counter  Erectile dysfunction - Will check hemoglobin A1c, testosterone level - TSH was normal at 1.6 - Gave prescription for Viagra now, explained to the patient about the side effects. He denies any anginal symptoms.  Borderline diabetes, hemoglobin A1c was 6.0 in June 2014,  will recheck again  History of CAD: Follows [facility], Dr. Jens Som, last stress test was in 2013 was normal  Recommendations: Flu shot today  Follow-up in 3 months     Perfecto Purdy M.D. 08/30/2013, 2:46 PM

## 2013-08-30 NOTE — Progress Notes (Signed)
Follow up appt. HTN and knee pain

## 2013-09-04 ENCOUNTER — Ambulatory Visit (HOSPITAL_COMMUNITY): Payer: Medicare Other | Attending: Cardiovascular Disease

## 2013-09-04 DIAGNOSIS — I739 Peripheral vascular disease, unspecified: Secondary | ICD-10-CM

## 2013-09-04 DIAGNOSIS — E785 Hyperlipidemia, unspecified: Secondary | ICD-10-CM | POA: Insufficient documentation

## 2013-09-04 DIAGNOSIS — Z951 Presence of aortocoronary bypass graft: Secondary | ICD-10-CM | POA: Insufficient documentation

## 2013-09-04 DIAGNOSIS — I2510 Atherosclerotic heart disease of native coronary artery without angina pectoris: Secondary | ICD-10-CM | POA: Insufficient documentation

## 2013-09-04 DIAGNOSIS — I70219 Atherosclerosis of native arteries of extremities with intermittent claudication, unspecified extremity: Secondary | ICD-10-CM

## 2013-09-04 DIAGNOSIS — Y849 Medical procedure, unspecified as the cause of abnormal reaction of the patient, or of later complication, without mention of misadventure at the time of the procedure: Secondary | ICD-10-CM | POA: Insufficient documentation

## 2013-09-04 DIAGNOSIS — T82898A Other specified complication of vascular prosthetic devices, implants and grafts, initial encounter: Secondary | ICD-10-CM | POA: Insufficient documentation

## 2013-09-04 DIAGNOSIS — I10 Essential (primary) hypertension: Secondary | ICD-10-CM | POA: Insufficient documentation

## 2013-09-05 ENCOUNTER — Encounter: Payer: Self-pay | Admitting: Cardiovascular Disease

## 2013-09-05 ENCOUNTER — Ambulatory Visit (INDEPENDENT_AMBULATORY_CARE_PROVIDER_SITE_OTHER): Payer: Medicare Other | Admitting: Cardiovascular Disease

## 2013-09-05 VITALS — BP 118/84 | HR 66 | Ht 70.0 in | Wt 222.0 lb

## 2013-09-05 DIAGNOSIS — I10 Essential (primary) hypertension: Secondary | ICD-10-CM

## 2013-09-05 DIAGNOSIS — I739 Peripheral vascular disease, unspecified: Secondary | ICD-10-CM

## 2013-09-05 LAB — PROTIME-INR
INR: 1.1 ratio — ABNORMAL HIGH (ref 0.8–1.0)
Prothrombin Time: 11.9 s (ref 10.2–12.4)

## 2013-09-05 LAB — CBC WITH DIFFERENTIAL/PLATELET
Eosinophils Absolute: 0.1 10*3/uL (ref 0.0–0.7)
Eosinophils Relative: 2.5 % (ref 0.0–5.0)
HCT: 39.8 % (ref 39.0–52.0)
Hemoglobin: 13.1 g/dL (ref 13.0–17.0)
Lymphs Abs: 2.4 10*3/uL (ref 0.7–4.0)
MCHC: 33.1 g/dL (ref 30.0–36.0)
MCV: 91.7 fl (ref 78.0–100.0)
Monocytes Absolute: 0.5 10*3/uL (ref 0.1–1.0)
Neutrophils Relative %: 31.7 % — ABNORMAL LOW (ref 43.0–77.0)
Platelets: 309 10*3/uL (ref 150.0–400.0)
RDW: 14.8 % — ABNORMAL HIGH (ref 11.5–14.6)

## 2013-09-05 LAB — BASIC METABOLIC PANEL
BUN: 13 mg/dL (ref 6–23)
CO2: 26 mEq/L (ref 19–32)
Calcium: 10.1 mg/dL (ref 8.4–10.5)
Chloride: 99 mEq/L (ref 96–112)
Creatinine, Ser: 1.1 mg/dL (ref 0.4–1.5)
Glucose, Bld: 118 mg/dL — ABNORMAL HIGH (ref 70–99)
Potassium: 4.4 mEq/L (ref 3.5–5.1)
Sodium: 136 mEq/L (ref 135–145)

## 2013-09-05 MED ORDER — CLOPIDOGREL BISULFATE 75 MG PO TABS: 75.0000 mg | ORAL_TABLET | Freq: Every day | ORAL | Status: DC

## 2013-09-05 NOTE — Progress Notes (Signed)
HPI: This is a 57 year old male who is here today for a followup visit regarding  peripheral arterial disease. He has known history of coronary artery disease with multiple interventions in the past. Cardiac catheterization in November of 2011 showed severe three-vessel coronary disease and he underwent coronary artery bypass graft surgery. He also had SFA stents placed bilaterally by Dr. Jacinto Halim years ago. He quit smoking in 2009. ABIs in July of 2012 were normal. In 2013, ABI was mildly reduced on the left side at 0.8 with evidence of restenosis in the left SFA stent. The right SFA stent was patent. He now reports significant worsening of Claudication and cramping left more than right which happens after walking half a block. This has significantly limited his ability to walk. He does complain of discomfort in his right leg mostly related to her knee surgery done last year.  No Known Allergies   Current Outpatient Prescriptions on File Prior to Visit  Medication Sig Dispense Refill  . acetaminophen (TYLENOL) 500 MG tablet Take 500 mg by mouth every 6 (six) hours as needed for pain.      [name] aspirin EC 81 MG tablet Take 81 mg by mouth daily.        . fish oil-omega-3 fatty acids 1000 MG capsule Take 1 capsule (1 g total) by mouth daily.  60 capsule  1  . lisinopril (PRINIVIL,ZESTRIL) 2.5 MG tablet Take 1 tablet (2.5 mg total) by mouth daily.  90 tablet  3  . lovastatin (MEVACOR) 20 MG tablet Take 20 mg by mouth at bedtime.      . meclizine (ANTIVERT) 12.5 MG tablet Take 1 tablet (12.5 mg total) by mouth 3 (three) times daily as needed for dizziness (also available over the counter).  30 tablet  3  . metoprolol tartrate (LOPRESSOR) 25 MG tablet Take 1 tablet (25 mg total) by mouth 2 (two) times daily.  60 tablet  3  . Multiple Vitamin (MULTIVITAMIN) tablet Take 1 tablet by mouth daily.  30 tablet  10  . Naproxen Sodium (ALEVE) 220 MG CAPS Take 220 mg by mouth every 6 (six) hours as needed.      .  nitroGLYCERIN (NITROSTAT) 0.4 MG SL tablet Place 1 tablet (0.4 mg total) under the tongue every 5 (five) minutes as needed for chest pain.  15 tablet  0  . sildenafil (VIAGRA) 100 MG tablet Take 0.5-1 tablets (50-100 mg total) by mouth daily as needed for erectile dysfunction.  15 tablet  3   No current facility-administered medications on file prior to visit.     Past Medical History  Diagnosis Date  . PAD (peripheral artery disease)     a. s/p bilat SFA stents;  b. ABIs 11/2012: R 0.99, L 0.86.  [name] Hypertension   . Hyperlipidemia   . Pancreatic cancer     surgery 2009  . Anemia, unspecified   . GERD (gastroesophageal reflux disease)   . Helicobacter pylori (H. pylori) infection   . Hemorrhoids   . Impotence of organic origin   . Bell's palsy   . CAD (coronary artery disease)     a. s/p multiple PCIs;  b. s/p CABG in 09/2010 (LIMA-LAD, SVG-OM1, SVG-distal RCA/OM2);  c.  Myoview 05/2012: EF 54%, no ischemia, no scar.   . Blood transfusion     during treatment for Ca  . DJD (degenerative joint disease)     low back & all over      Past Surgical History  Procedure Laterality Date  . Pancreatic  cancer  05/10/2008    Resection of distal panrease and spleen  . Splenectomy    . Coronary artery bypass graft  nov 2011  . Back surgery      1992  . Cardiac catheterization    . Hemorrhoid surgery  10/30/2011    Procedure: HEMORRHOIDECTOMY;  Surgeon: Shelly Rubenstein, MD;  Location: [facility];  Service: General;  Laterality: N/A;     Family History  Problem Relation Age of Onset  . Heart attack Father     died of MI at age 23  . Cancer Father     pt unaware of what kind  . Anesthesia problems Neg Hx   . Hypotension Neg Hx   . Malignant hyperthermia Neg Hx   . Pseudochol deficiency Neg Hx      History   Social History  . Marital Status: Single    Spouse Name: N/A    Number of Children: 0  . Years of Education: N/A   Occupational History  .     Social History Main  Topics  . Smoking status: Former Smoker    Types: Cigarettes    Quit date: 11/29/2008  . Smokeless tobacco: Never Used  . Alcohol Use: 1.2 oz/week    2 Glasses of wine per week     Comment: Once a week   . Drug Use: No  . Sexual Activity: Not on file   Other Topics Concern  . 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 He appears well-developed and well-nourished. No distress.  HENT: No nasal discharge.  Head: Normocephalic and atraumatic.  Eyes: Pupils are equal and round. Right eye exhibits no discharge. Left eye exhibits no discharge.  Neck: Normal range of motion. Neck supple. No JVD present. No thyromegaly present.  Cardiovascular: Normal rate, regular rhythm, normal heart sounds and. Exam reveals no gallop and no friction rub. No murmur heard.  Pulmonary/Chest: Effort normal and breath sounds normal. No stridor. No respiratory distress. He has no wheezes. He has no rales. He exhibits no tenderness.  Abdominal: Soft. Bowel sounds are normal. He exhibits no distension. There is no tenderness. There is no rebound and no guarding.  Musculoskeletal: Normal range of motion. He exhibits no edema and no tenderness.  Neurological: He is alert and oriented to person, place, and time. Coordination normal.  Skin: Skin is warm and dry. No rash noted. He is not diaphoretic. No erythema. No pallor.  Psychiatric: He has a normal mood and affect. His behavior is normal. Judgment and thought content normal.  Vascular: Femoral pulses are slightly diminished bilaterally. normal bilaterally. PT: +2 in the right side and 0 on the left side, DP: + 1 On the right side and absent on the left side.      ASSESSMENT AND PLAN

## 2013-09-05 NOTE — Patient Instructions (Signed)
Your physician has requested that you have a peripheral vascular angiogram. This exam is performed at the hospital. During this exam IV contrast is used to look at arterial blood flow. Please review the information sheet given for details.  Your physician recommends that you have lab work today: BMP, CBC and PT/INR  Your physician has recommended you make the following change in your medication: START Plavix 75mg  take one by mouth daily

## 2013-09-05 NOTE — Assessment & Plan Note (Addendum)
The patient now has severe lifestyle limiting claudication affecting most of the left calf. He does have discomfort involving the right leg which could be related to his previous knee surgery. Femoral pulses are also slightly diminished and thus there is possibility of inflow disease. Lower extremity duplex yesterday showed patent right SFA stent. The left SFA stent is now occluded. Given severity of his symptoms and affecting his lifestyle, I recommend proceeding with abdominal aortogram, lower extremity runoff and possible angioplasty. Risks, benefits and alternatives were discussed with the patient. Start Plavix 75 mg once daily. He might be a good candidate for the Endomax study.

## 2013-09-06 ENCOUNTER — Other Ambulatory Visit: Payer: Self-pay | Admitting: Cardiovascular Disease

## 2013-09-06 DIAGNOSIS — I739 Peripheral vascular disease, unspecified: Secondary | ICD-10-CM

## 2013-09-07 ENCOUNTER — Ambulatory Visit: Payer: Medicare Other | Attending: Internal Medicine | Admitting: Internal Medicine

## 2013-09-07 ENCOUNTER — Encounter: Payer: Self-pay | Admitting: Internal Medicine

## 2013-09-07 VITALS — BP 127/75 | HR 66 | Temp 98.2°F | Resp 16 | Ht 70.0 in | Wt 220.0 lb

## 2013-09-07 DIAGNOSIS — E349 Endocrine disorder, unspecified: Secondary | ICD-10-CM | POA: Insufficient documentation

## 2013-09-07 DIAGNOSIS — E291 Testicular hypofunction: Secondary | ICD-10-CM

## 2013-09-07 DIAGNOSIS — Z125 Encounter for screening for malignant neoplasm of prostate: Secondary | ICD-10-CM | POA: Insufficient documentation

## 2013-09-07 MED ORDER — TESTOSTERONE CYPIONATE 200 MG/ML IM SOLN: 200.0000 mg | INTRAMUSCULAR | Status: DC

## 2013-09-07 NOTE — Progress Notes (Signed)
Patient ID: Steven Hale, male   DOB: 07/13/1956, 57 y.o.   MRN: [id number] Patient Demographics  Steven Hale, is a 57 y.o. male  [id number]  [id number]  DOB - 11/18/1955  Chief Complaint  Patient presents with  . Follow-up        Subjective:   Steven Hale is a 57 y.o. male here today for a follow up visit. He is here today for testosterone injection, he was recently diagnosed with low testosterone level and erectile dysfunction. He could not afford Viagra. He has no complaint today Patient has No headache, No chest pain, No abdominal pain - No Nausea, No new weakness tingling or numbness, No Cough - SOB.  ALLERGIES: No Known Allergies  PAST MEDICAL HISTORY: Past Medical History  Diagnosis Date  . PAD (peripheral artery disease)     a. s/p bilat SFA stents;  b. ABIs 11/2012: R 0.99, L 0.86.  [name] Hypertension   . Hyperlipidemia   . Pancreatic cancer     surgery 2009  . Anemia, unspecified   . GERD (gastroesophageal reflux disease)   . Helicobacter pylori (H. pylori) infection   . Hemorrhoids   . Impotence of organic origin   . Bell's palsy   . CAD (coronary artery disease)     a. s/p multiple PCIs;  b. s/p CABG in 09/2010 (LIMA-LAD, SVG-OM1, SVG-distal RCA/OM2);  c.  Myoview 05/2012: EF 54%, no ischemia, no scar.   . Blood transfusion     during treatment for Ca  . DJD (degenerative joint disease)     low back & all over     MEDICATIONS AT HOME: Prior to Admission medications   Medication Sig Start Date End Date Taking? Authorizing Provider  aspirin EC 81 MG tablet Take 81 mg by mouth daily.     Yes Historical Provider, MD  clopidogrel (PLAVIX) 75 MG tablet Take 1 tablet (75 mg total) by mouth daily. 09/05/13  Yes Iran Ouch, MD  fish oil-omega-3 fatty acids 1000 MG capsule Take 1 capsule (1 g total) by mouth daily. 11/30/12  Yes Jimmie Molly, MD  lisinopril (PRINIVIL,ZESTRIL) 2.5 MG tablet Take 1 tablet (2.5 mg total) by mouth daily. 03/08/13  Yes Scott Moishe Spice, PA-C  lovastatin (MEVACOR) 20 MG tablet Take 20 mg by mouth at bedtime.   Yes Historical Provider, MD  meclizine (ANTIVERT) 12.5 MG tablet Take 1 tablet (12.5 mg total) by mouth 3 (three) times daily as needed for dizziness (also available over the counter). 08/30/13  Yes Ripudeep Jenna Luo, MD  Multiple Vitamin (MULTIVITAMIN) tablet Take 1 tablet by mouth daily. 11/30/12  Yes Jimmie Molly, MD  nitroGLYCERIN (NITROSTAT) 0.4 MG SL tablet Place 1 tablet (0.4 mg total) under the tongue every 5 (five) minutes as needed for chest pain. 11/30/12  Yes Jimmie Molly, MD  sildenafil (VIAGRA) 100 MG tablet Take 0.5-1 tablets (50-100 mg total) by mouth daily as needed for erectile dysfunction. 08/30/13  Yes Ripudeep Jenna Luo, MD  acetaminophen (TYLENOL) 500 MG tablet Take 500 mg by mouth every 6 (six) hours as needed for pain.    Historical Provider, MD  metoprolol tartrate (LOPRESSOR) 25 MG tablet Take 1 tablet (25 mg total) by mouth 2 (two) times daily. 04/17/13 09/05/13  Acey Lav, MD  Naproxen Sodium (ALEVE) 220 MG CAPS Take 220 mg by mouth every 6 (six) hours as needed.    Historical Provider, MD     Objective:   Filed Vitals:   09/07/13 1546  BP: 127/75  Pulse: 66  Temp: 98.2 F (36.8 C)  TempSrc: Oral  Resp: 16  Height: 5\' 10"  (1.778 m)  Weight: 220 lb (99.791 kg)  SpO2: 97%    Exam General appearance : Awake, alert, not in any distress. Speech Clear. Not toxic looking HEENT: Atraumatic and Normocephalic, pupils equally reactive to light and accomodation Neck: supple, no JVD. No cervical lymphadenopathy.  Chest:Good air entry bilaterally, no added sounds  CVS: S1 S2 regular, no murmurs.  Abdomen: Bowel sounds present, Non tender and not distended with no gaurding, rigidity or rebound. Extremities: B/L Lower Ext shows no edema, both legs are warm to touch Neurology: Awake alert, and oriented X 3, CN II-XII intact, Non focal Skin:No Rash Wounds:N/A   Data Review   CBC  Recent  Labs Lab 09/05/13 1002  WBC 4.4*  HGB 13.1  HCT 39.8  PLT 309.0  MCV 91.7  MCHC 33.1  RDW 14.8*  LYMPHSABS 2.4  MONOABS 0.5  EOSABS 0.1  BASOSABS 0.0    Chemistries    Recent Labs Lab 09/05/13 1002  NA 136  K 4.4  CL 99  CO2 26  GLUCOSE 118*  BUN 13  CREATININE 1.1  CALCIUM 10.1   ------------------------------------------------------------------------------------------------------------------ No results found for this basename: HGBA1C,  in the last 72 hours ------------------------------------------------------------------------------------------------------------------ No results found for this basename: CHOL, HDL, LDLCALC, TRIG, CHOLHDL, LDLDIRECT,  in the last 72 hours ------------------------------------------------------------------------------------------------------------------ No results found for this basename: TSH, T4TOTAL, FREET3, T3FREE, THYROIDAB,  in the last 72 hours ------------------------------------------------------------------------------------------------------------------ No results found for this basename: VITAMINB12, FOLATE, FERRITIN, TIBC, IRON, RETICCTPCT,  in the last 72 hours  Coagulation profile   Recent Labs Lab 09/05/13 1002  INR 1.1*      Assessment & Plan   Patient Active Problem List   Diagnosis Date Noted  . Testosterone deficiency 09/07/2013  . Prostate cancer screening 09/07/2013  . Benign paroxysmal positional vertigo 08/30/2013  . Erectile dysfunction 08/30/2013  . Diabetes 08/30/2013  . PAD (peripheral artery disease) 06/02/2012  . Preop cardiovascular exam 05/18/2012  . Internal and external bleeding hemorrhoids 09/08/2011  . Chest pain 05/14/2011  . Claudication 05/14/2011  . CONSTIPATION 01/23/2011  . DENTAL CARIES 10/15/2010  . ANEMIA 08/05/2010  . HYPERURICEMIA 08/05/2010  . BOILS, RECURRENT 08/04/2010  . GERD 03/11/2010  . HELICOBACTER PYLORI INFECTION 02/10/2010  . HEMORRHOIDS 10/24/2009  . BLOOD  IN STOOL 09/20/2009  . TESTICULAR HYPOFUNCTION 09/19/2009  . TOBACCO ABUSE 08/14/2009  . NEURODERMATITIS 08/14/2009  . ERECTILE DYSFUNCTION, SECONDARY TO MEDICATION 04/16/2009  . SHOULDER PAIN, RIGHT 03/06/2009  . Other peripheral vascular disease(443.89) 01/08/2009  . BELL'S PALSY, RIGHT 12/25/2008  . FACIAL WEAKNESS 12/18/2008  . HYPERCHOLESTEROLEMIA 10/26/2008  . WEAKNESS 06/14/2008  . DYSLIPIDEMIA 06/05/2008  . ESSENTIAL HYPERTENSION, BENIGN 06/05/2008  . CARCINOMA, PANCREAS 05/10/2008  . SPLENECTOMY, HX OF 05/10/2008  . MYOCARDIAL INFARCTION 11/17/2003  . CAD 11/17/2003     Plan: Testosterone Depo injection 200 mg IM q. monthly prescribed Patient has been counseled extensively about followup of liver function, blood sugar, and prostatic enlargement and PSA Patient was given educational material onset testosterone injection  Continue other medications for comorbidities   Follow up in 28 days for another injection   The patient was given clear instructions to go to ER or return to medical center if symptoms don't improve, worsen or new problems develop. The patient verbalized understanding. The patient was told to call to get lab results if they haven't heard anything in the  next week.    Jeanann Lewandowsky, MD, MHA, FACP, FAAP [facility] [place], [place] [phone]   09/07/2013, 4:23 PM

## 2013-09-07 NOTE — Progress Notes (Signed)
Pt is here for a f/u visit. Pt is here to get testosterone injections.

## 2013-09-07 NOTE — Patient Instructions (Signed)
Testosterone injection What is this medicine? TESTOSTERONE (tes TOS ter one) is the main male hormone. It supports normal male development such as muscle growth, facial hair, and deep voice. It is used in males to treat low testosterone levels. This medicine may be used for other purposes; ask your health care provider or pharmacist if you have questions. What should I tell my health care provider before I take this medicine? They need to know if you have any of these conditions: -breast cancer -diabetes -heart disease -kidney disease -liver disease -lung disease -prostate cancer, enlargement -an unusual or allergic reaction to testosterone, other medicines, foods, dyes, or preservatives -pregnant or trying to get pregnant -breast-feeding How should I use this medicine? This medicine is for injection into a muscle. It is usually given by a health care professional in a hospital or clinic setting. Contact your pediatrician regarding the use of this medicine in children. While this medicine may be prescribed for children as young as 12 years of age for selected conditions, precautions do apply. Overdosage: If you think you have taken too much of this medicine contact a poison control center or emergency room at once. NOTE: This medicine is only for you. Do not share this medicine with others. What if I miss a dose? Try not to miss a dose. Your doctor or health care professional will tell you when your next injection is due. Notify the office if you are unable to keep an appointment. What may interact with this medicine? -medicines for diabetes -medicines that treat or prevent blood clots like warfarin -oxyphenbutazone -propranolol -steroid medicines like prednisone or cortisone This list may not describe all possible interactions. Give your health care provider a list of all the medicines, herbs, non-prescription drugs, or dietary supplements you use. Also tell them if you smoke, drink  alcohol, or use illegal drugs. Some items may interact with your medicine. What should I watch for while using this medicine? Visit your doctor or health care professional for regular checks on your progress. They will need to check the level of testosterone in your blood. This medicine may affect blood sugar levels. If you have diabetes, check with your doctor or health care professional before you change your diet or the dose of your diabetic medicine. This drug is banned from use in athletes by most athletic organizations. What side effects may I notice from receiving this medicine? Side effects that you should report to your doctor or health care professional as soon as possible: -allergic reactions like skin rash, itching or hives, swelling of the face, lips, or tongue -breast enlargement -breathing problems -changes in mood, especially anger, depression, or rage -dark urine -general ill feeling or flu-like symptoms -light-colored stools -loss of appetite, nausea -nausea, vomiting -right upper belly pain -stomach pain -swelling of ankles -too frequent or persistent erections -trouble passing urine or change in the amount of urine -unusually weak or tired -yellowing of the eyes or skin Additional side effects that can occur in women include: -deep or hoarse voice -facial hair growth -irregular menstrual periods Side effects that usually do not require medical attention (report to your doctor or health care professional if they continue or are bothersome): -acne -change in sex drive or performance -hair loss -headache This list may not describe all possible side effects. Call your doctor for medical advice about side effects. You may report side effects to FDA at 1-800-FDA-1088. Where should I keep my medicine? Keep out of the reach of children. This medicine   can be abused. Keep your medicine in a safe place to protect it from theft. Do not share this medicine with anyone.  Selling or giving away this medicine is dangerous and against the law. Store at room temperature between 20 and 25 degrees C (68 and 77 degrees F). Do not freeze. Protect from light. Follow the directions for the product you are prescribed. Throw away any unused medicine after the expiration date. NOTE: This sheet is a summary. It may not cover all possible information. If you have questions about this medicine, talk to your doctor, pharmacist, or health care provider.  2012, Elsevier/Gold Standard. (01/13/2008 4:13:46 PM) 

## 2013-09-11 ENCOUNTER — Ambulatory Visit: Payer: Medicare Other | Attending: Internal Medicine

## 2013-09-11 ENCOUNTER — Encounter (HOSPITAL_COMMUNITY): Payer: Self-pay | Admitting: Pharmacy Technician

## 2013-09-11 VITALS — BP 152/66 | HR 58 | Temp 98.3°F | Resp 16 | Ht 70.0 in | Wt 218.0 lb

## 2013-09-11 DIAGNOSIS — E785 Hyperlipidemia, unspecified: Secondary | ICD-10-CM | POA: Insufficient documentation

## 2013-09-11 NOTE — Patient Instructions (Signed)
Pt is to return in 28 days for another testosterone injection.

## 2013-09-11 NOTE — Progress Notes (Signed)
Pt is here today to get a testosterone injection.

## 2013-09-12 MED ORDER — TESTOSTERONE CYPIONATE 100 MG/ML IM SOLN: 200.0000 mg | Freq: Once | INTRAMUSCULAR | Status: DC

## 2013-09-13 ENCOUNTER — Other Ambulatory Visit: Payer: Self-pay | Admitting: Internal Medicine

## 2013-09-13 ENCOUNTER — Encounter (HOSPITAL_COMMUNITY)
Admission: RE | Disposition: A | Discharge status: Home / Self Care | Payer: Self-pay | Source: Ambulatory Visit | Attending: Cardiovascular Disease

## 2013-09-13 ENCOUNTER — Ambulatory Visit (HOSPITAL_COMMUNITY)
Admission: RE | Admit: 2013-09-13 | Discharge: 2013-09-13 | Disposition: A | Discharge status: Home / Self Care | Payer: Medicare Other | Source: Ambulatory Visit | Attending: Cardiovascular Disease | Admitting: Cardiovascular Disease

## 2013-09-13 DIAGNOSIS — Z79899 Other long term (current) drug therapy: Secondary | ICD-10-CM | POA: Insufficient documentation

## 2013-09-13 DIAGNOSIS — Z87891 Personal history of nicotine dependence: Secondary | ICD-10-CM | POA: Insufficient documentation

## 2013-09-13 DIAGNOSIS — I70219 Atherosclerosis of native arteries of extremities with intermittent claudication, unspecified extremity: Secondary | ICD-10-CM

## 2013-09-13 DIAGNOSIS — I10 Essential (primary) hypertension: Secondary | ICD-10-CM | POA: Insufficient documentation

## 2013-09-13 DIAGNOSIS — Z951 Presence of aortocoronary bypass graft: Secondary | ICD-10-CM | POA: Insufficient documentation

## 2013-09-13 DIAGNOSIS — I743 Embolism and thrombosis of arteries of the lower extremities: Secondary | ICD-10-CM

## 2013-09-13 DIAGNOSIS — M1990 Unspecified osteoarthritis, unspecified site: Secondary | ICD-10-CM | POA: Insufficient documentation

## 2013-09-13 DIAGNOSIS — Z85858 Personal history of malignant neoplasm of other endocrine glands: Secondary | ICD-10-CM | POA: Insufficient documentation

## 2013-09-13 DIAGNOSIS — I739 Peripheral vascular disease, unspecified: Secondary | ICD-10-CM

## 2013-09-13 DIAGNOSIS — Z7982 Long term (current) use of aspirin: Secondary | ICD-10-CM | POA: Insufficient documentation

## 2013-09-13 DIAGNOSIS — K219 Gastro-esophageal reflux disease without esophagitis: Secondary | ICD-10-CM | POA: Insufficient documentation

## 2013-09-13 DIAGNOSIS — T82898A Other specified complication of vascular prosthetic devices, implants and grafts, initial encounter: Secondary | ICD-10-CM | POA: Insufficient documentation

## 2013-09-13 DIAGNOSIS — K649 Unspecified hemorrhoids: Secondary | ICD-10-CM | POA: Insufficient documentation

## 2013-09-13 DIAGNOSIS — Y831 Surgical operation with implant of artificial internal device as the cause of abnormal reaction of the patient, or of later complication, without mention of misadventure at the time of the procedure: Secondary | ICD-10-CM | POA: Insufficient documentation

## 2013-09-13 DIAGNOSIS — E785 Hyperlipidemia, unspecified: Secondary | ICD-10-CM | POA: Insufficient documentation

## 2013-09-13 DIAGNOSIS — G510 Bell's palsy: Secondary | ICD-10-CM | POA: Insufficient documentation

## 2013-09-13 DIAGNOSIS — N529 Male erectile dysfunction, unspecified: Secondary | ICD-10-CM | POA: Insufficient documentation

## 2013-09-13 DIAGNOSIS — I2510 Atherosclerotic heart disease of native coronary artery without angina pectoris: Secondary | ICD-10-CM | POA: Insufficient documentation

## 2013-09-13 HISTORY — PX: THROMBECTOMY FEMORAL ARTERY: SHX6406

## 2013-09-13 HISTORY — PX: ABDOMINAL AORTAGRAM: SHX5454

## 2013-09-13 LAB — CBC
HCT: 37.1 % — ABNORMAL LOW (ref 39.0–52.0)
Hemoglobin: 12.6 g/dL — ABNORMAL LOW (ref 13.0–17.0)
MCH: 31.2 pg (ref 26.0–34.0)
MCHC: 34 g/dL (ref 30.0–36.0)
MCV: 91.8 fL (ref 78.0–100.0)
Platelets: 300 10*3/uL (ref 150–400)
Platelets: 315 10*3/uL (ref 150–400)
RBC: 4.04 MIL/uL — ABNORMAL LOW (ref 4.22–5.81)
RDW: 14.4 % (ref 11.5–15.5)
WBC: 5 10*3/uL (ref 4.0–10.5)

## 2013-09-13 LAB — PROTIME-INR: INR: 1.04 (ref 0.00–1.49)

## 2013-09-13 SURGERY — ABDOMINAL AORTAGRAM
Anesthesia: LOCAL | Laterality: Left

## 2013-09-13 MED ORDER — MIDAZOLAM HCL 2 MG/2ML IJ SOLN
INTRAMUSCULAR | Status: AC
  Filled 2013-09-13: qty 2

## 2013-09-13 MED ORDER — TADALAFIL 20 MG PO TABS: 10.0000 mg | ORAL_TABLET | ORAL | Status: DC | PRN

## 2013-09-13 MED ORDER — SODIUM CHLORIDE 0.9 % IJ SOLN: 3.0000 mL | INTRAMUSCULAR | Status: DC | PRN

## 2013-09-13 MED ORDER — SODIUM CHLORIDE 0.9 % IV SOLN: 250.0000 mL | INTRAVENOUS | Status: DC | PRN

## 2013-09-13 MED ORDER — SODIUM CHLORIDE 0.9 % IJ SOLN: 3.0000 mL | Freq: Two times a day (BID) | INTRAMUSCULAR | Status: DC

## 2013-09-13 MED ORDER — ACETAMINOPHEN 325 MG PO TABS
650.0000 mg | ORAL_TABLET | ORAL | Status: DC | PRN
  Administered 2013-09-13: 650 mg via ORAL
  Filled 2013-09-13: qty 2

## 2013-09-13 MED ORDER — HEPARIN (PORCINE) IN NACL 2-0.9 UNIT/ML-% IJ SOLN
INTRAMUSCULAR | Status: AC
  Filled 2013-09-13: qty 500

## 2013-09-13 MED ORDER — SODIUM CHLORIDE 0.9 % IV SOLN
INTRAVENOUS | Status: DC
  Administered 2013-09-13: 09:00:00 via INTRAVENOUS

## 2013-09-13 MED ORDER — ASPIRIN 81 MG PO CHEW: 81.0000 mg | CHEWABLE_TABLET | ORAL | Status: DC

## 2013-09-13 MED ORDER — SODIUM CHLORIDE 0.9 % IV SOLN: INTRAVENOUS | Status: DC

## 2013-09-13 MED ORDER — FENTANYL CITRATE 0.05 MG/ML IJ SOLN
INTRAMUSCULAR | Status: AC
  Filled 2013-09-13: qty 2

## 2013-09-13 MED ORDER — LIDOCAINE HCL (PF) 1 % IJ SOLN
INTRAMUSCULAR | Status: AC
  Filled 2013-09-13: qty 30

## 2013-09-13 NOTE — Progress Notes (Signed)
RIGHT GROIN WITH SMALL AMT BLEEDING NOTED AND PRESSURE HELD X 15 MIN AND NO FURTHER BLEEDING NOTED; RIGHT GROIN SOFT; C/O 3/10 PAIN RIGHT GROIN AND PAIN MED GIVEN

## 2013-09-13 NOTE — CV Procedure (Signed)
PERIPHERAL VASCULAR PROCEDURE  NAME:  Steven Hale   MRN: [id number] DOB:  [birth year]   ADMIT DATE: 09/13/2013  Performing Cardiologist: Lorine Bears Primary Physician: Jeanann Lewandowsky, MD Primary Cardiologist:  Dr. Jens Som  Procedures Performed:  Abdominal Aortic Angiogram with Bi-Iliofemoral Runoff  Bilateral Lower Extremity Angiography (1st Order)  Selective left lower extremity arterial angiography  Left SFA directional atherectomy using the turbo hawk device with distal protection device.  Aspiration thrombectomy of the left SFA for embolization above the filter.  Balloon angioplasty of the left SFA.  Mynx closure device    Indication(s):  Severe lifestyle limiting claudication  Consent: The procedure with Risks/Benefits/Alternatives and Indications was reviewed with the patient .  All questions were answered.  Medications:  Sedation:  1 mg IV Versed, 50 mcg IV Fentanyl  Contrast:  165 mL  Visipaque   Procedural details: The right groin was prepped, draped, and anesthetized with 1% lidocaine. Using modified Seldinger technique, a 5 French sheath was introduced into the right common femoral artery. A 5 Fr Short Pigtail Catheter was advanced of over a  Versicore wire into the descending Aorta to a level just above the renal arteries. A power injection of 62ml/sec contrast over 1 sec was performed for Abdominal Aortic and iliac Angiography.    Bilateral lower extremity arterial run off was then performed via power injection of 7 ml / sec contrast for a total of 77 ml.  Interventional Procedure:  The patient was enrolled in the Endomax trial. The pigtail catheter was changed over the Versicore wire for A crossover catheter which was then pulled back the aortic bifurcation and the wire was advanced down the contralateral common iliac artery.  The wire was then advanced to the contralateral common femoral artery. The catheter and sheath were removed and exchanged  into a 7 French 55 cm Ansel sheath with the tip placed in the proximal left SFA. The study drug was given. Once there was a therapeutic ACT, the occlusion was crossed with a Glidewire over a quick cross catheter without much difficulty. The wire was removed and angiography was performed via the quick cross catheter which confirmed intraluminal position. I then advanced a 5 mm spider Fx distal protection device through the quick cross catheter placed at in the popliteal artery. Directional atherectomy was then performed with  The Turbo Hawk device.  with a total of 3 passes. Large amount of atheroma was achieved and there was also thrombus. Angiography showed embolization of thrombus to the proximal popliteal above the distal protection device. There was sluggish flow and I suspected that the basket was full. Thus, I used a Pronto aspiration thrombectomy catheter with 2 passes. Clots were retrieved. Angiography showed resolution of thrombus. I then used a 5 on a 150 mm balloon which was inflated to 4 atmospheres for 2 minutes. Angiography showed excellent results with no evidence of distal embolization. There was a residual 20% stenosis in the proximal area of stent. There was a tiny non-flow-limiting dissection proximal to the stent which did not require treatment.  The distal reduction device was retrieved with the retrieval catheter. Final angiography showed good results. The sheath was exchanged into a short 7 [place] sheath.   Hemostasis was achieved with a  Mynx closure device. The patient tolerated the procedure well with no immediate complications.     Hemodynamics:  Central Aortic Pressure / Mean Aortic Pressure: 119/70   Findings:  Abdominal aorta: Normal in size with no evidence of aneurysm or  significant stenosis.   Left renal artery: 20% proximal stenosis   Right renal artery: 20% proximal stenosis   Celiac artery: Not well visualized   Superior mesenteric artery: Patent   Right  common iliac artery: Minor irregularities   Right internal iliac artery: Minor irregularities   Right external iliac artery: Minor irregularities   Right common femoral artery: Minor irregularities   Right profunda femoral artery: Normal   Right superficial femoral artery: Minor irregularities with a stent in the midsegment. The stent is patent with 10-20% instent restenosis.   Right popliteal artery: 20% proximal stenosis   Three-vessel runoff below the knee with minor irregularities.   Left common iliac artery:  Minor irregularities   Left internal iliac artery: Minor irregularities   Left external iliac artery: Minor irregularities   Left common femoral artery: Normal   Left profunda femoral artery: Normal   Left superficial femoral artery:  Mild diffuse disease proximally. The vessel is occluded in the midsegment 2 cm above the previously placed stent. The vessel reconstitutes via collaterals from the profunda a few centimeters distal to the stent.   Left popliteal artery: Normal    Left tibial peroneal trunk: Minor irregularities   Left anterior tibial artery: Occluded proximally   Left peroneal artery: Occluded distally   Left posterior tibial artery: Patent with minor irregularities   Conclusions: 1. No significant aortoiliac disease. Patent right SFA stent with mild instent restenosis three-vessel runoff on the right side. Occluded left SFA stent with 2 vessel runoff below the knee.   2. Successful directional atherectomy and balloon angioplasty of the occluded left SFA.  Recommendations:  Continue dual antiplatelet therapy for at least one month. Aggressive treatment of risk factors is recommended.   Lorine Bears, MD, [facility] 09/13/2013 12:11 PM

## 2013-09-13 NOTE — Interval H&P Note (Signed)
History and Physical Interval Note:  09/13/2013 10:10 AM  Steven Hale  has presented today for surgery, with the diagnosis of pvd  The various methods of treatment have been discussed with the patient and family. After consideration of risks, benefits and other options for treatment, the patient has consented to  Procedure(s): ABDOMINAL AORTAGRAM (N/A) as a surgical intervention .  The patient's history has been reviewed, patient examined, no change in status, stable for surgery.  I have reviewed the patient's chart and labs.  Questions were answered to the patient's satisfaction.     Lorine Bears

## 2013-09-13 NOTE — Research (Signed)
[facility] Informed Consent   Subject Name: Steven Hale  Subject met inclusion and exclusion criteria.  The informed consent form, study requirements and expectations were reviewed with the subject and questions and concerns were addressed prior to the signing of the consent form.  The subject verbalized understanding of the trail requirements.  The subject agreed to participate in the [facility] trial and signed the informed consent.  The informed consent was obtained prior to performance of any protocol-specific procedures for the subject.  A copy of the signed informed consent was given to the subject and a copy was placed in the subject's medical record.  HUGH PRUITT 09/13/2013, [id number]

## 2013-09-13 NOTE — Progress Notes (Signed)
UP AND WALKED AND TOL WELL AND RIGHT GROIN STABLE; NO BLEEDING OR HEMATOMA 

## 2013-09-13 NOTE — H&P (View-Only) (Signed)
  HPI: This is a 57-year-old male who is here today for a followup visit regarding  peripheral arterial disease. He has known history of coronary artery disease with multiple interventions in the past. Cardiac catheterization in November of 2011 showed severe three-vessel coronary disease and he underwent coronary artery bypass graft surgery. He also had SFA stents placed bilaterally by Dr. Ganji years ago. He quit smoking in 2009. ABIs in July of 2012 were normal. In 2013, ABI was mildly reduced on the left side at 0.8 with evidence of restenosis in the left SFA stent. The right SFA stent was patent. He now reports significant worsening of Claudication and cramping left more than right which happens after walking half a block. This has significantly limited his ability to walk. He does complain of discomfort in his right leg mostly related to her knee surgery done last year.  No Known Allergies   Current Outpatient Prescriptions on File Prior to Visit  Medication Sig Dispense Refill  . acetaminophen (TYLENOL) 500 MG tablet Take 500 mg by mouth every 6 (six) hours as needed for pain.      . aspirin EC 81 MG tablet Take 81 mg by mouth daily.        . fish oil-omega-3 fatty acids 1000 MG capsule Take 1 capsule (1 g total) by mouth daily.  60 capsule  1  . lisinopril (PRINIVIL,ZESTRIL) 2.5 MG tablet Take 1 tablet (2.5 mg total) by mouth daily.  90 tablet  3  . lovastatin (MEVACOR) 20 MG tablet Take 20 mg by mouth at bedtime.      . meclizine (ANTIVERT) 12.5 MG tablet Take 1 tablet (12.5 mg total) by mouth 3 (three) times daily as needed for dizziness (also available over the counter).  30 tablet  3  . metoprolol tartrate (LOPRESSOR) 25 MG tablet Take 1 tablet (25 mg total) by mouth 2 (two) times daily.  60 tablet  3  . Multiple Vitamin (MULTIVITAMIN) tablet Take 1 tablet by mouth daily.  30 tablet  10  . Naproxen Sodium (ALEVE) 220 MG CAPS Take 220 mg by mouth every 6 (six) hours as needed.      .  nitroGLYCERIN (NITROSTAT) 0.4 MG SL tablet Place 1 tablet (0.4 mg total) under the tongue every 5 (five) minutes as needed for chest pain.  15 tablet  0  . sildenafil (VIAGRA) 100 MG tablet Take 0.5-1 tablets (50-100 mg total) by mouth daily as needed for erectile dysfunction.  15 tablet  3   No current facility-administered medications on file prior to visit.     Past Medical History  Diagnosis Date  . PAD (peripheral artery disease)     a. s/p bilat SFA stents;  b. ABIs 11/2012: R 0.99, L 0.86.  . Hypertension   . Hyperlipidemia   . Pancreatic cancer     surgery 2009  . Anemia, unspecified   . GERD (gastroesophageal reflux disease)   . Helicobacter pylori (H. pylori) infection   . Hemorrhoids   . Impotence of organic origin   . Bell's palsy   . CAD (coronary artery disease)     a. s/p multiple PCIs;  b. s/p CABG in 09/2010 (LIMA-LAD, SVG-OM1, SVG-distal RCA/OM2);  c.  Myoview 05/2012: EF 54%, no ischemia, no scar.   . Blood transfusion     during treatment for Ca  . DJD (degenerative joint disease)     low back & all over      Past Surgical History    Procedure Laterality Date  . Pancreatic  cancer  05/10/2008    Resection of distal panrease and spleen  . Splenectomy    . Coronary artery bypass graft  nov 2011  . Back surgery      1992  . Cardiac catheterization    . Hemorrhoid surgery  10/30/2011    Procedure: HEMORRHOIDECTOMY;  Surgeon: Douglas A Blackman, MD;  Location: [facility] OR;  Service: General;  Laterality: N/A;     Family History  Problem Relation Age of Onset  . Heart attack Father     died of MI at age 74  . Cancer Father     pt unaware of what kind  . Anesthesia problems Neg Hx   . Hypotension Neg Hx   . Malignant hyperthermia Neg Hx   . Pseudochol deficiency Neg Hx      History   Social History  . Marital Status: Single    Spouse Name: N/A    Number of Children: 0  . Years of Education: N/A   Occupational History  .     Social History Main  Topics  . Smoking status: Former Smoker    Types: Cigarettes    Quit date: 11/29/2008  . Smokeless tobacco: Never Used  . Alcohol Use: 1.2 oz/week    2 Glasses of wine per week     Comment: Once a week   . Drug Use: No  . Sexual Activity: Not on file   Other Topics Concern  . 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 He appears well-developed and well-nourished. No distress.  HENT: No nasal discharge.  Head: Normocephalic and atraumatic.  Eyes: Pupils are equal and round. Right eye exhibits no discharge. Left eye exhibits no discharge.  Neck: Normal range of motion. Neck supple. No JVD present. No thyromegaly present.  Cardiovascular: Normal rate, regular rhythm, normal heart sounds and. Exam reveals no gallop and no friction rub. No murmur heard.  Pulmonary/Chest: Effort normal and breath sounds normal. No stridor. No respiratory distress. He has no wheezes. He has no rales. He exhibits no tenderness.  Abdominal: Soft. Bowel sounds are normal. He exhibits no distension. There is no tenderness. There is no rebound and no guarding.  Musculoskeletal: Normal range of motion. He exhibits no edema and no tenderness.  Neurological: He is alert and oriented to person, place, and time. Coordination normal.  Skin: Skin is warm and dry. No rash noted. He is not diaphoretic. No erythema. No pallor.  Psychiatric: He has a normal mood and affect. His behavior is normal. Judgment and thought content normal.  Vascular: Femoral pulses are slightly diminished bilaterally. normal bilaterally. PT: +2 in the right side and 0 on the left side, DP: + 1 On the right side and absent on the left side.      ASSESSMENT AND PLAN   

## 2013-09-14 ENCOUNTER — Other Ambulatory Visit: Payer: Self-pay | Admitting: Internal Medicine

## 2013-09-14 MED ORDER — SILDENAFIL CITRATE 20 MG PO TABS: 50.0000 mg | ORAL_TABLET | ORAL | Status: DC | PRN

## 2013-09-26 ENCOUNTER — Other Ambulatory Visit: Payer: Self-pay | Admitting: Emergency Medicine

## 2013-09-26 MED ORDER — METOPROLOL TARTRATE 25 MG PO TABS: 25.0000 mg | ORAL_TABLET | Freq: Two times a day (BID) | ORAL | Status: DC

## 2013-09-27 ENCOUNTER — Emergency Department (HOSPITAL_COMMUNITY)
Admission: EM | Admit: 2013-09-27 | Discharge: 2013-09-27 | Disposition: A | Discharge status: Home / Self Care | Payer: Medicare Other | Attending: Emergency Medicine | Admitting: Emergency Medicine

## 2013-09-27 ENCOUNTER — Encounter (HOSPITAL_COMMUNITY): Payer: Self-pay | Admitting: Emergency Medicine

## 2013-09-27 DIAGNOSIS — Z9889 Other specified postprocedural states: Secondary | ICD-10-CM | POA: Insufficient documentation

## 2013-09-27 DIAGNOSIS — Z7902 Long term (current) use of antithrombotics/antiplatelets: Secondary | ICD-10-CM | POA: Insufficient documentation

## 2013-09-27 DIAGNOSIS — Z9861 Coronary angioplasty status: Secondary | ICD-10-CM | POA: Insufficient documentation

## 2013-09-27 DIAGNOSIS — M1990 Unspecified osteoarthritis, unspecified site: Secondary | ICD-10-CM | POA: Insufficient documentation

## 2013-09-27 DIAGNOSIS — E785 Hyperlipidemia, unspecified: Secondary | ICD-10-CM | POA: Insufficient documentation

## 2013-09-27 DIAGNOSIS — I10 Essential (primary) hypertension: Secondary | ICD-10-CM | POA: Insufficient documentation

## 2013-09-27 DIAGNOSIS — Z8509 Personal history of malignant neoplasm of other digestive organs: Secondary | ICD-10-CM | POA: Insufficient documentation

## 2013-09-27 DIAGNOSIS — K219 Gastro-esophageal reflux disease without esophagitis: Secondary | ICD-10-CM | POA: Insufficient documentation

## 2013-09-27 DIAGNOSIS — Z87891 Personal history of nicotine dependence: Secondary | ICD-10-CM | POA: Insufficient documentation

## 2013-09-27 DIAGNOSIS — M545 Low back pain, unspecified: Secondary | ICD-10-CM | POA: Insufficient documentation

## 2013-09-27 DIAGNOSIS — R209 Unspecified disturbances of skin sensation: Secondary | ICD-10-CM | POA: Insufficient documentation

## 2013-09-27 DIAGNOSIS — Z79899 Other long term (current) drug therapy: Secondary | ICD-10-CM | POA: Insufficient documentation

## 2013-09-27 DIAGNOSIS — Z862 Personal history of diseases of the blood and blood-forming organs and certain disorders involving the immune mechanism: Secondary | ICD-10-CM | POA: Insufficient documentation

## 2013-09-27 DIAGNOSIS — Z951 Presence of aortocoronary bypass graft: Secondary | ICD-10-CM | POA: Insufficient documentation

## 2013-09-27 DIAGNOSIS — Z7982 Long term (current) use of aspirin: Secondary | ICD-10-CM | POA: Insufficient documentation

## 2013-09-27 DIAGNOSIS — Z8619 Personal history of other infectious and parasitic diseases: Secondary | ICD-10-CM | POA: Insufficient documentation

## 2013-09-27 DIAGNOSIS — IMO0002 Reserved for concepts with insufficient information to code with codable children: Secondary | ICD-10-CM | POA: Insufficient documentation

## 2013-09-27 DIAGNOSIS — M5416 Radiculopathy, lumbar region: Secondary | ICD-10-CM

## 2013-09-27 DIAGNOSIS — I2510 Atherosclerotic heart disease of native coronary artery without angina pectoris: Secondary | ICD-10-CM | POA: Insufficient documentation

## 2013-09-27 DIAGNOSIS — M549 Dorsalgia, unspecified: Secondary | ICD-10-CM

## 2013-09-27 DIAGNOSIS — Z8669 Personal history of other diseases of the nervous system and sense organs: Secondary | ICD-10-CM | POA: Insufficient documentation

## 2013-09-27 MED ORDER — DIAZEPAM 5 MG PO TABS
5.0000 mg | ORAL_TABLET | Freq: Once | ORAL | Status: AC
  Administered 2013-09-27: 5 mg via ORAL
  Filled 2013-09-27: qty 1

## 2013-09-27 MED ORDER — KETOROLAC TROMETHAMINE 60 MG/2ML IM SOLN
60.0000 mg | Freq: Once | INTRAMUSCULAR | Status: AC
  Administered 2013-09-27: 60 mg via INTRAMUSCULAR
  Filled 2013-09-27: qty 2

## 2013-09-27 MED ORDER — DIAZEPAM 5 MG PO TABS: 5.0000 mg | ORAL_TABLET | Freq: Two times a day (BID) | ORAL | Status: DC

## 2013-09-27 NOTE — ED Notes (Signed)
Pt c/o of lower back pain 10/10. States that its chronic, increasing worse yesterday. Use ice pack with no relief.

## 2013-09-27 NOTE — ED Provider Notes (Signed)
Medical screening examination/treatment/procedure(s) were performed by non-physician practitioner and as supervising physician I was immediately available for consultation/collaboration.  EKG Interpretation   None         Robinette Esters J Maury Bamba, MD 09/27/13 0805 

## 2013-09-27 NOTE — ED Provider Notes (Signed)
CSN: [id number]     Arrival date & time 09/27/13  4098 History   First MD Initiated Contact with Patient 09/27/13 [phone]     Chief Complaint  Patient presents with  . Back Pain   (Consider location/radiation/quality/duration/timing/severity/associated sxs/prior Treatment) HPI Comments: Patient is a 57 year old male with a past medical history of chronic back pain s/p back surgery in 1992 and DJD who presents to the emergency department complaining of low back pain x1 day. Patient states he always has back pain, however yesterday when he was walking down the stairs he "twisted the wrong way"and began to feel a sharp pain in the left side of his back radiating down his left leg with associated numbness and tingling. He tried applying an ice pack with temporary relief. Sitting or bending backwards make pain worse. Denies loss of control of bowels/bladder, saddle anesthesia, fever, chills or night sweats. He sees [facility] [facility] for his back problems, states he has "bone on bone" DJD, had steroid injections about 8 months ago.  The history is provided by the patient.    Past Medical History  Diagnosis Date  . PAD (peripheral artery disease)     a. s/p bilat SFA stents;  b. ABIs 11/2012: R 0.99, L 0.86.  [name] Hypertension   . Hyperlipidemia   . Pancreatic cancer     surgery 2009  . Anemia, unspecified   . GERD (gastroesophageal reflux disease)   . Helicobacter pylori (H. pylori) infection   . Hemorrhoids   . Impotence of organic origin   . Bell's palsy   . CAD (coronary artery disease)     a. s/p multiple PCIs;  b. s/p CABG in 09/2010 (LIMA-LAD, SVG-OM1, SVG-distal RCA/OM2);  c.  Myoview 05/2012: EF 54%, no ischemia, no scar.   . Blood transfusion     during treatment for Ca  . DJD (degenerative joint disease)     low back & all over    Past Surgical History  Procedure Laterality Date  . Pancreatic  cancer  05/10/2008    Resection of distal panrease and spleen  . Splenectomy    . Coronary  artery bypass graft  nov 2011  . Back surgery      1992  . Cardiac catheterization    . Hemorrhoid surgery  10/30/2011    Procedure: HEMORRHOIDECTOMY;  Surgeon: Shelly Rubenstein, MD;  Location: [facility] OR;  Service: General;  Laterality: N/A;   Family History  Problem Relation Age of Onset  . Heart attack Father     died of MI at age 46  . Cancer Father     pt unaware of what kind  . Anesthesia problems Neg Hx   . Hypotension Neg Hx   . Malignant hyperthermia Neg Hx   . Pseudochol deficiency Neg Hx    History  Substance Use Topics  . Smoking status: Former Smoker    Types: Cigarettes    Quit date: 11/29/2008  . Smokeless tobacco: Never Used  . Alcohol Use: 1.2 oz/week    2 Glasses of wine per week     Comment: Once a week     Review of Systems  Musculoskeletal: Positive for back pain.  Neurological: Positive for numbness.    Allergies  Review of patient's allergies indicates no known allergies.  Home Medications   Current Outpatient Rx  Name  Route  Sig  Dispense  Refill  . aspirin EC 81 MG tablet   Oral   Take 81 mg by mouth  daily.           . clopidogrel (PLAVIX) 75 MG tablet   Oral   Take 1 tablet (75 mg total) by mouth daily.   90 tablet   3   . fish oil-omega-3 fatty acids 1000 MG capsule   Oral   Take 1 capsule (1 g total) by mouth daily.   60 capsule   1   . lisinopril (PRINIVIL,ZESTRIL) 2.5 MG tablet   Oral   Take 1 tablet (2.5 mg total) by mouth daily.   90 tablet   3   . lovastatin (MEVACOR) 20 MG tablet   Oral   Take 40 mg by mouth at bedtime.          . meclizine (ANTIVERT) 12.5 MG tablet   Oral   Take 1 tablet (12.5 mg total) by mouth 3 (three) times daily as needed for dizziness (also available over the counter).   30 tablet   3   . metoprolol tartrate (LOPRESSOR) 25 MG tablet   Oral   Take 1 tablet (25 mg total) by mouth 2 (two) times daily.   60 tablet   0   . Multiple Vitamin (MULTIVITAMIN) tablet   Oral   Take 1  tablet by mouth daily.   30 tablet   10   . nitroGLYCERIN (NITROSTAT) 0.4 MG SL tablet   Sublingual   Place 1 tablet (0.4 mg total) under the tongue every 5 (five) minutes as needed for chest pain.   15 tablet   0   . sildenafil (REVATIO) 20 MG tablet   Oral   Take 2.5-5 tablets (50-100 mg total) by mouth every other day as needed.   45 tablet   1   . sildenafil (VIAGRA) 100 MG tablet   Oral   Take 0.5-1 tablets (50-100 mg total) by mouth daily as needed for erectile dysfunction.   15 tablet   3   . tadalafil (CIALIS) 20 MG tablet   Oral   Take 0.5-1 tablets (10-20 mg total) by mouth every other day as needed for erectile dysfunction.   5 tablet   11   . testosterone cypionate (DEPO-TESTOSTERONE) 200 MG/ML injection   Intramuscular   Inject 1 mL (200 mg total) into the muscle every 28 (twenty-eight) days.   10 mL   0    BP 149/93  Pulse 81  Temp(Src) 97.9 F (36.6 C) (Oral)  Resp 18  SpO2 98% Physical Exam  Nursing note and vitals reviewed. Constitutional: He is oriented to person, place, and time. He appears well-developed and well-nourished. No distress.  HENT:  Head: Normocephalic and atraumatic.  Mouth/Throat: Oropharynx is clear and moist.  Eyes: Conjunctivae are normal.  Neck: Normal range of motion. Neck supple.  Cardiovascular: Normal rate, regular rhythm and normal heart sounds.   Pulmonary/Chest: Effort normal and breath sounds normal.  Musculoskeletal: Normal range of motion. He exhibits no edema.  TTP of left paralumbar muscles and SI joint. No spinous process tenderness.  Neurological: He is alert and oriented to person, place, and time.  Strength LE 5/5 and equal BL. Normal gait. Sensation intact. DTRs intact and symmetric.  Skin: Skin is warm and dry. He is not diaphoretic.  Psychiatric: He has a normal mood and affect. His behavior is normal.    ED Course  Procedures (including critical care time) Labs Review Labs Reviewed - No data to  display Imaging Review No results found.  EKG Interpretation   None  MDM  No diagnosis found. Patient with acute on chronic back pain.  No red flags concerning patient's back pain. No s/s of central cord compression or cauda equina. Lower extremities are neurovascularly intact and patient is ambulating without difficulty. Stable for discharge home. Prescription for valium, f/u with his back specialist. Return precautions given. Patient states understanding of treatment care plan and is agreeable.   Trevor Mace, PA-C 09/27/13 [phone]

## 2013-10-01 ENCOUNTER — Emergency Department (HOSPITAL_COMMUNITY)
Admission: EM | Admit: 2013-10-01 | Discharge: 2013-10-01 | Disposition: A | Discharge status: Home / Self Care | Payer: Medicare Other | Attending: Emergency Medicine | Admitting: Emergency Medicine

## 2013-10-01 ENCOUNTER — Encounter (HOSPITAL_COMMUNITY): Payer: Self-pay | Admitting: Emergency Medicine

## 2013-10-01 DIAGNOSIS — Z862 Personal history of diseases of the blood and blood-forming organs and certain disorders involving the immune mechanism: Secondary | ICD-10-CM | POA: Insufficient documentation

## 2013-10-01 DIAGNOSIS — Z7902 Long term (current) use of antithrombotics/antiplatelets: Secondary | ICD-10-CM | POA: Insufficient documentation

## 2013-10-01 DIAGNOSIS — M549 Dorsalgia, unspecified: Secondary | ICD-10-CM

## 2013-10-01 DIAGNOSIS — Z8739 Personal history of other diseases of the musculoskeletal system and connective tissue: Secondary | ICD-10-CM | POA: Insufficient documentation

## 2013-10-01 DIAGNOSIS — M545 Low back pain, unspecified: Secondary | ICD-10-CM | POA: Insufficient documentation

## 2013-10-01 DIAGNOSIS — Z95818 Presence of other cardiac implants and grafts: Secondary | ICD-10-CM | POA: Insufficient documentation

## 2013-10-01 DIAGNOSIS — Z8669 Personal history of other diseases of the nervous system and sense organs: Secondary | ICD-10-CM | POA: Insufficient documentation

## 2013-10-01 DIAGNOSIS — Z7982 Long term (current) use of aspirin: Secondary | ICD-10-CM | POA: Insufficient documentation

## 2013-10-01 DIAGNOSIS — Z8619 Personal history of other infectious and parasitic diseases: Secondary | ICD-10-CM | POA: Insufficient documentation

## 2013-10-01 DIAGNOSIS — Z951 Presence of aortocoronary bypass graft: Secondary | ICD-10-CM | POA: Insufficient documentation

## 2013-10-01 DIAGNOSIS — Z87891 Personal history of nicotine dependence: Secondary | ICD-10-CM | POA: Insufficient documentation

## 2013-10-01 DIAGNOSIS — I2510 Atherosclerotic heart disease of native coronary artery without angina pectoris: Secondary | ICD-10-CM | POA: Insufficient documentation

## 2013-10-01 DIAGNOSIS — Z8509 Personal history of malignant neoplasm of other digestive organs: Secondary | ICD-10-CM | POA: Insufficient documentation

## 2013-10-01 DIAGNOSIS — Z79899 Other long term (current) drug therapy: Secondary | ICD-10-CM | POA: Insufficient documentation

## 2013-10-01 DIAGNOSIS — Z8719 Personal history of other diseases of the digestive system: Secondary | ICD-10-CM | POA: Insufficient documentation

## 2013-10-01 DIAGNOSIS — Z87448 Personal history of other diseases of urinary system: Secondary | ICD-10-CM | POA: Insufficient documentation

## 2013-10-01 DIAGNOSIS — R209 Unspecified disturbances of skin sensation: Secondary | ICD-10-CM | POA: Insufficient documentation

## 2013-10-01 DIAGNOSIS — I10 Essential (primary) hypertension: Secondary | ICD-10-CM | POA: Insufficient documentation

## 2013-10-01 DIAGNOSIS — E785 Hyperlipidemia, unspecified: Secondary | ICD-10-CM | POA: Insufficient documentation

## 2013-10-01 DIAGNOSIS — G8929 Other chronic pain: Secondary | ICD-10-CM | POA: Insufficient documentation

## 2013-10-01 MED ORDER — DIAZEPAM 5 MG PO TABS: 2.5000 mg | ORAL_TABLET | Freq: Four times a day (QID) | ORAL | Status: DC | PRN

## 2013-10-01 MED ORDER — OXYCODONE-ACETAMINOPHEN 5-325 MG PO TABS: 1.0000 | ORAL_TABLET | ORAL | Status: DC | PRN

## 2013-10-01 MED ORDER — DIAZEPAM 5 MG PO TABS
5.0000 mg | ORAL_TABLET | Freq: Once | ORAL | Status: AC
  Administered 2013-10-01: 5 mg via ORAL
  Filled 2013-10-01: qty 1

## 2013-10-01 MED ORDER — HYDROMORPHONE HCL PF 1 MG/ML IJ SOLN
1.0000 mg | Freq: Once | INTRAMUSCULAR | Status: AC
  Administered 2013-10-01: 1 mg via INTRAMUSCULAR

## 2013-10-01 MED ORDER — HYDROMORPHONE HCL PF 1 MG/ML IJ SOLN
1.0000 mg | Freq: Once | INTRAMUSCULAR | Status: DC
  Filled 2013-10-01: qty 1

## 2013-10-01 MED ORDER — HYDROMORPHONE HCL PF 1 MG/ML IJ SOLN: 1.0000 mg | Freq: Once | INTRAMUSCULAR | Status: DC

## 2013-10-01 MED ORDER — IBUPROFEN 200 MG PO TABS
600.0000 mg | ORAL_TABLET | Freq: Once | ORAL | Status: AC
  Administered 2013-10-01: 600 mg via ORAL
  Filled 2013-10-01: qty 3

## 2013-10-01 MED ORDER — DIPHENHYDRAMINE HCL 50 MG/ML IJ SOLN: 25.0000 mg | Freq: Once | INTRAMUSCULAR | Status: DC

## 2013-10-01 NOTE — ED Notes (Signed)
Pt having back pain; he has history of chronic back pain w/ PMH of back surgery

## 2013-10-01 NOTE — ED Notes (Signed)
Discharge instructions explained to pt and given to patient along w/ prescriptions; pt verbalized understanding instructions.

## 2013-10-01 NOTE — ED Provider Notes (Signed)
CSN: [id number]     Arrival date & time 10/01/13  0810 History   First MD Initiated Contact with Patient 10/01/13 0813     No chief complaint on file.  (Consider location/radiation/quality/duration/timing/severity/associated sxs/prior Treatment) HPI  A 57 year old male with lower back pain. Chronic issue. Worse within the past week. Denies any acute trauma. Pain is in the left lower back. Improved with standing. Worse when getting up from a seated position. Pins and needles sensation extending down his left lower extremity to his foot. No weakness. No incontinence or retention. Past history lumbar surgery in 1995. Reports current orthopedic care by Dr. Sheela Stack. Recent abdominal angiography a couple weeks ago. Is on aspirin and Plavix for his history of coronary artery disease and peripheral vascular disease.   Past Medical History  Diagnosis Date  . PAD (peripheral artery disease)     a. s/p bilat SFA stents;  b. ABIs 11/2012: R 0.99, L 0.86.  [name] Hypertension   . Hyperlipidemia   . Pancreatic cancer     surgery 2009  . Anemia, unspecified   . GERD (gastroesophageal reflux disease)   . Helicobacter pylori (H. pylori) infection   . Hemorrhoids   . Impotence of organic origin   . Bell's palsy   . CAD (coronary artery disease)     a. s/p multiple PCIs;  b. s/p CABG in 09/2010 (LIMA-LAD, SVG-OM1, SVG-distal RCA/OM2);  c.  Myoview 05/2012: EF 54%, no ischemia, no scar.   . Blood transfusion     during treatment for Ca  . DJD (degenerative joint disease)     low back & all over    Past Surgical History  Procedure Laterality Date  . Pancreatic  cancer  05/10/2008    Resection of distal panrease and spleen  . Splenectomy    . Coronary artery bypass graft  nov 2011  . Back surgery      1992  . Cardiac catheterization    . Hemorrhoid surgery  10/30/2011    Procedure: HEMORRHOIDECTOMY;  Surgeon: Shelly Rubenstein, MD;  Location: [facility] OR;  Service: General;  Laterality: N/A;   Family  History  Problem Relation Age of Onset  . Heart attack Father     died of MI at age 51  . Cancer Father     pt unaware of what kind  . Anesthesia problems Neg Hx   . Hypotension Neg Hx   . Malignant hyperthermia Neg Hx   . Pseudochol deficiency Neg Hx    History  Substance Use Topics  . Smoking status: Former Smoker    Types: Cigarettes    Quit date: 11/29/2008  . Smokeless tobacco: Never Used  . Alcohol Use: 1.2 oz/week    2 Glasses of wine per week     Comment: Once a week     Review of Systems  All systems reviewed and negative, other than as noted in HPI.   Allergies  Review of patient's allergies indicates no known allergies.  Home Medications   Current Outpatient Rx  Name  Route  Sig  Dispense  Refill  . aspirin EC 81 MG tablet   Oral   Take 81 mg by mouth daily.           . clopidogrel (PLAVIX) 75 MG tablet   Oral   Take 1 tablet (75 mg total) by mouth daily.   90 tablet   3   . diazepam (VALIUM) 5 MG tablet   Oral   Take  1 tablet (5 mg total) by mouth 2 (two) times daily.   10 tablet   0   . diazepam (VALIUM) 5 MG tablet   Oral   Take 0.5 tablets (2.5 mg total) by mouth every 6 (six) hours as needed (muscle spasm).   10 tablet   0   . fish oil-omega-3 fatty acids 1000 MG capsule   Oral   Take 1 capsule (1 g total) by mouth daily.   60 capsule   1   . lisinopril (PRINIVIL,ZESTRIL) 2.5 MG tablet   Oral   Take 1 tablet (2.5 mg total) by mouth daily.   90 tablet   3   . lovastatin (MEVACOR) 20 MG tablet   Oral   Take 40 mg by mouth at bedtime.          . meclizine (ANTIVERT) 12.5 MG tablet   Oral   Take 1 tablet (12.5 mg total) by mouth 3 (three) times daily as needed for dizziness (also available over the counter).   30 tablet   3   . metoprolol tartrate (LOPRESSOR) 25 MG tablet   Oral   Take 1 tablet (25 mg total) by mouth 2 (two) times daily.   60 tablet   0   . Multiple Vitamin (MULTIVITAMIN) tablet   Oral   Take 1  tablet by mouth daily.   30 tablet   10   . nitroGLYCERIN (NITROSTAT) 0.4 MG SL tablet   Sublingual   Place 1 tablet (0.4 mg total) under the tongue every 5 (five) minutes as needed for chest pain.   15 tablet   0   . oxyCODONE-acetaminophen (PERCOCET/ROXICET) 5-325 MG per tablet   Oral   Take 1-2 tablets by mouth every 4 (four) hours as needed for severe pain.   20 tablet   0   . sildenafil (REVATIO) 20 MG tablet   Oral   Take 2.5-5 tablets (50-100 mg total) by mouth every other day as needed.   45 tablet   1   . sildenafil (VIAGRA) 100 MG tablet   Oral   Take 0.5-1 tablets (50-100 mg total) by mouth daily as needed for erectile dysfunction.   15 tablet   3   . tadalafil (CIALIS) 20 MG tablet   Oral   Take 0.5-1 tablets (10-20 mg total) by mouth every other day as needed for erectile dysfunction.   5 tablet   11   . testosterone cypionate (DEPO-TESTOSTERONE) 200 MG/ML injection   Intramuscular   Inject 1 mL (200 mg total) into the muscle every 28 (twenty-eight) days.   10 mL   0    BP 145/86  Pulse 67  Temp(Src) 98.5 F (36.9 C) (Oral)  Resp 16  SpO2 97% Physical Exam  Nursing note and vitals reviewed. Constitutional: He appears well-developed and well-nourished. No distress.  Pt standing and leaning against wall when I entered room. NAD.  HENT:  Head: Normocephalic and atraumatic.  Eyes: Conjunctivae are normal. Right eye exhibits no discharge. Left eye exhibits no discharge.  Neck: Neck supple.  Cardiovascular: Normal rate, regular rhythm and normal heart sounds.  Exam reveals no gallop and no friction rub.   No murmur heard. Pulmonary/Chest: Effort normal and breath sounds normal. No respiratory distress.  Abdominal: Soft. He exhibits no distension. There is no tenderness.  Musculoskeletal: He exhibits no edema and no tenderness.  L paraspinal tenderness as well as over L SI joint. No midline tenderness. NO concerning skin lesions. Gait  steady. Strength  5/5 b/l LE. Sensation intact to light touch.   Neurological: He is alert. He exhibits normal muscle tone. Coordination normal.  Skin: Skin is warm and dry.  Psychiatric: He has a normal mood and affect. His behavior is normal. Thought content normal.    ED Course  Procedures (including critical care time) Labs Review Labs Reviewed - No data to display Imaging Review No results found.  EKG Interpretation   None       MDM   1. Back pain    57 year old male with atraumatic back pain. On October 29, patient had abdominal aortic and bilateral lower extremity angiography, but doubt complication of this procedure. Patient has a history of chronic back pain and his current symptoms and exam are consistent with a muscle skeletal etiology.  He is on aspirin and Plavix, doubt spinal epidural hematoma or other potential emergent etiology. Nonfocal neuro exam. No urinary complaints. Has orthopedic follow-up later this moth. Continued symptomatic tx. Pt reports very good relief with meds given during last ED visit. Taking NSAID and benzo currently.  Review of [place] controlled substance database does not show pattern concerning for med seeking behavior. Will additionally add an opiate for better pain control. Return precautions discussed.   Raeford Razor, MD 10/01/13 [phone]

## 2013-10-02 ENCOUNTER — Ambulatory Visit (HOSPITAL_COMMUNITY): Payer: Medicare Other | Attending: Cardiovascular Disease | Admitting: Cardiology

## 2013-10-02 DIAGNOSIS — Z951 Presence of aortocoronary bypass graft: Secondary | ICD-10-CM | POA: Insufficient documentation

## 2013-10-02 DIAGNOSIS — I10 Essential (primary) hypertension: Secondary | ICD-10-CM | POA: Insufficient documentation

## 2013-10-02 DIAGNOSIS — E785 Hyperlipidemia, unspecified: Secondary | ICD-10-CM | POA: Insufficient documentation

## 2013-10-02 DIAGNOSIS — I2510 Atherosclerotic heart disease of native coronary artery without angina pectoris: Secondary | ICD-10-CM | POA: Insufficient documentation

## 2013-10-02 DIAGNOSIS — I700 Atherosclerosis of aorta: Secondary | ICD-10-CM

## 2013-10-02 DIAGNOSIS — Z87891 Personal history of nicotine dependence: Secondary | ICD-10-CM | POA: Insufficient documentation

## 2013-10-02 DIAGNOSIS — I739 Peripheral vascular disease, unspecified: Secondary | ICD-10-CM | POA: Insufficient documentation

## 2013-10-09 ENCOUNTER — Ambulatory Visit: Payer: Medicare Other | Attending: Internal Medicine | Admitting: Internal Medicine

## 2013-10-09 ENCOUNTER — Encounter: Payer: Self-pay | Admitting: Internal Medicine

## 2013-10-09 VITALS — BP 121/80 | HR 63 | Temp 98.4°F | Resp 16 | Ht 70.0 in | Wt 214.0 lb

## 2013-10-09 DIAGNOSIS — R7989 Other specified abnormal findings of blood chemistry: Secondary | ICD-10-CM

## 2013-10-09 DIAGNOSIS — E291 Testicular hypofunction: Secondary | ICD-10-CM

## 2013-10-09 MED ORDER — TESTOSTERONE CYPIONATE 100 MG/ML IM SOLN
200.0000 mg | Freq: Once | INTRAMUSCULAR | Status: AC
  Administered 2013-10-09: 200 mg via INTRAMUSCULAR

## 2013-10-09 NOTE — Patient Instructions (Signed)
Pt is to return in 28 days for his next inj.

## 2013-10-09 NOTE — Progress Notes (Signed)
Pt is here for testosterone inj. Only.

## 2013-10-09 NOTE — Progress Notes (Signed)
Patient ID: Steven Hale, male   DOB: 20-Jul-1956, 57 y.o.   MRN: [id number] Patient is here for a testosterone injection Given No complaints

## 2013-10-10 ENCOUNTER — Encounter: Payer: Self-pay | Admitting: Cardiovascular Disease

## 2013-10-10 ENCOUNTER — Ambulatory Visit (INDEPENDENT_AMBULATORY_CARE_PROVIDER_SITE_OTHER): Payer: Medicare Other | Admitting: Cardiovascular Disease

## 2013-10-10 VITALS — BP 132/80 | HR 71 | Ht 70.0 in | Wt 216.0 lb

## 2013-10-10 DIAGNOSIS — E785 Hyperlipidemia, unspecified: Secondary | ICD-10-CM

## 2013-10-10 DIAGNOSIS — I739 Peripheral vascular disease, unspecified: Secondary | ICD-10-CM

## 2013-10-10 DIAGNOSIS — E7800 Pure hypercholesterolemia, unspecified: Secondary | ICD-10-CM

## 2013-10-10 DIAGNOSIS — I2510 Atherosclerotic heart disease of native coronary artery without angina pectoris: Secondary | ICD-10-CM

## 2013-10-10 MED ORDER — ATORVASTATIN CALCIUM 40 MG PO TABS: 40.0000 mg | ORAL_TABLET | Freq: Every day | ORAL | Status: DC

## 2013-10-10 NOTE — Assessment & Plan Note (Signed)
He reports no symptoms of angina. Continue medical therapy. 

## 2013-10-10 NOTE — Patient Instructions (Addendum)
Your physician has recommended you make the following change in your medication: STOP Lovastatin, START Atorvastatin 40mg  take one by mouth every evening  Your physician recommends that you return for a FASTING LIPID and LIVER Profile in 6 WEEKS--nothing to eat or drink after midnight, lab opens at 7:30 AM  Your physician has requested that you have a lower extremity arterial duplex and ABI in MAY 2015 (this should be performed 1 Week prior to Dr Kirke Corin appointment). This test is an ultrasound of the arteries in the legs or arms. It looks at arterial blood flow in the legs and arms. Allow one hour for Lower and Upper Arterial scans. There are no restrictions or special instructions  Your physician wants you to follow-up in: MAY 2015 with Dr Kirke Corin.  You will receive a reminder letter in the mail two months in advance. If you don't receive a letter, please call our office to schedule the follow-up appointment.

## 2013-10-10 NOTE — Assessment & Plan Note (Signed)
He is doing well after her recent atherectomy and balloon angioplasty for an occluded left SFA stent. Continue long-term dual antiplatelet therapy given his history of CAD and PVD. He is to get lower extremity arterial duplex and ABI within 6 months.

## 2013-10-10 NOTE — Progress Notes (Signed)
HPI: This is a 57 year old male who is here today for a followup visit regarding  peripheral arterial disease. He has known history of coronary artery disease with multiple interventions in the past. Cardiac catheterization in November of 2011 showed severe three-vessel coronary disease and he underwent coronary artery bypass graft surgery. He also had SFA stents placed bilaterally by Dr. Jacinto Halim years ago. He quit smoking in 2009.  He was seen recently for significant worsening of Claudication and cramping left calf.  I proceeded with angiography which showed an occluded distal left SFA starting above the previously placed stent. I performed directional atherectomy and balloon angioplasty which establish normal flow. He had no postprocedure complications. He reports resolution of claudication. Post procedure ABI was normal. He reports in intense workout on Saturday on the treadmill followed by left leg discomfort. This gradually improved with rest.  No Known Allergies   Current Outpatient Prescriptions on File Prior to Visit  Medication Sig Dispense Refill  . aspirin EC 81 MG tablet Take 81 mg by mouth daily.        . clopidogrel (PLAVIX) 75 MG tablet Take 1 tablet (75 mg total) by mouth daily.  90 tablet  3  . fish oil-omega-3 fatty acids 1000 MG capsule Take 1 capsule (1 g total) by mouth daily.  60 capsule  1  . lisinopril (PRINIVIL,ZESTRIL) 2.5 MG tablet Take 1 tablet (2.5 mg total) by mouth daily.  90 tablet  3  . lovastatin (MEVACOR) 20 MG tablet Take 40 mg by mouth at bedtime.       . meclizine (ANTIVERT) 12.5 MG tablet Take 1 tablet (12.5 mg total) by mouth 3 (three) times daily as needed for dizziness (also available over the counter).  30 tablet  3  . metoprolol tartrate (LOPRESSOR) 25 MG tablet Take 1 tablet (25 mg total) by mouth 2 (two) times daily.  60 tablet  0  . Multiple Vitamin (MULTIVITAMIN) tablet Take 1 tablet by mouth daily.  30 tablet  10  . nitroGLYCERIN (NITROSTAT) 0.4  MG SL tablet Place 1 tablet (0.4 mg total) under the tongue every 5 (five) minutes as needed for chest pain.  15 tablet  0  . tadalafil (CIALIS) 20 MG tablet Take 0.5-1 tablets (10-20 mg total) by mouth every other day as needed for erectile dysfunction.  5 tablet  11  . testosterone cypionate (DEPO-TESTOSTERONE) 200 MG/ML injection Inject 1 mL (200 mg total) into the muscle every 28 (twenty-eight) days.  10 mL  0   Current Facility-Administered Medications on File Prior to Visit  Medication Dose Route Frequency Provider Last Rate Last Dose  . testosterone cypionate (DEPOTESTOTERONE CYPIONATE) injection 200 mg  200 mg Intramuscular Once Jeanann Lewandowsky, MD         Past Medical History  Diagnosis Date  . PAD (peripheral artery disease)     a. s/p bilat SFA stents;  b. ABIs 11/2012: R 0.99, L 0.86.  [name] Hypertension   . Hyperlipidemia   . Pancreatic cancer     surgery 2009  . Anemia, unspecified   . GERD (gastroesophageal reflux disease)   . Helicobacter pylori (H. pylori) infection   . Hemorrhoids   . Impotence of organic origin   . Bell's palsy   . CAD (coronary artery disease)     a. s/p multiple PCIs;  b. s/p CABG in 09/2010 (LIMA-LAD, SVG-OM1, SVG-distal RCA/OM2);  c.  Myoview 05/2012: EF 54%, no ischemia, no scar.   . Blood transfusion  during treatment for Ca  . DJD (degenerative joint disease)     low back & all over      Past Surgical History  Procedure Laterality Date  . Pancreatic  cancer  05/10/2008    Resection of distal panrease and spleen  . Splenectomy    . Coronary artery bypass graft  nov 2011  . Back surgery      1992  . Cardiac catheterization    . Hemorrhoid surgery  10/30/2011    Procedure: HEMORRHOIDECTOMY;  Surgeon: Shelly Rubenstein, MD;  Location: [facility] OR;  Service: General;  Laterality: N/A;     Family History  Problem Relation Age of Onset  . Heart attack Father     died of MI at age 32  . Cancer Father     pt unaware of what kind  .  Anesthesia problems Neg Hx   . Hypotension Neg Hx   . Malignant hyperthermia Neg Hx   . Pseudochol deficiency Neg Hx      History   Social History  . Marital Status: Single    Spouse Name: N/A    Number of Children: 0  . Years of Education: N/A   Occupational History  .     Social History Main Topics  . Smoking status: Former Smoker    Types: Cigarettes    Quit date: 11/29/2008  . Smokeless tobacco: Never Used  . Alcohol Use: 1.2 oz/week    2 Glasses of wine per week     Comment: Once a week   . Drug Use: No  . Sexual Activity: Not on file   Other Topics Concern  . 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 He appears well-developed and well-nourished. No distress.  HENT: No nasal discharge.  Head: Normocephalic and atraumatic.  Eyes: Pupils are equal and round. Right eye exhibits no discharge. Left eye exhibits no discharge.  Neck: Normal range of motion. Neck supple. No JVD present. No thyromegaly present.  Cardiovascular: Normal rate, regular rhythm, normal heart sounds and. Exam reveals no gallop and no friction rub. No murmur heard.  Pulmonary/Chest: Effort normal and breath sounds normal. No stridor. No respiratory distress. He has no wheezes. He has no rales. He exhibits no tenderness.  Abdominal: Soft. Bowel sounds are normal. He exhibits no distension. There is no tenderness. There is no rebound and no guarding.  Musculoskeletal: Normal range of motion. He exhibits no edema and no tenderness.  Neurological: He is alert and oriented to person, place, and time. Coordination normal.  Skin: Skin is warm and dry. No rash noted. He is not diaphoretic. No erythema. No pallor.  Psychiatric: He has a normal  mood and affect. His behavior is normal. Judgment and thought content normal.  Vascular: Femoral pulses are slightly diminished bilaterally. normal bilaterally. PT: +2 in the right side and 0 on the left side, DP: + 1 On the right side and +1 on the left side.      ASSESSMENT AND PLAN

## 2013-10-10 NOTE — Assessment & Plan Note (Signed)
I recommend a more potent statin. Thus, I switched lovastatin to atorvastatin 40 mg once daily. Check fasting lipid and liver profile in 6 weeks.

## 2013-10-30 ENCOUNTER — Other Ambulatory Visit: Payer: Self-pay | Admitting: Internal Medicine

## 2013-11-06 ENCOUNTER — Ambulatory Visit: Payer: Medicare Other | Attending: Internal Medicine

## 2013-11-06 DIAGNOSIS — E291 Testicular hypofunction: Secondary | ICD-10-CM

## 2013-11-06 DIAGNOSIS — R7989 Other specified abnormal findings of blood chemistry: Secondary | ICD-10-CM

## 2013-11-06 DIAGNOSIS — E7800 Pure hypercholesterolemia, unspecified: Secondary | ICD-10-CM

## 2013-11-06 DIAGNOSIS — E785 Hyperlipidemia, unspecified: Secondary | ICD-10-CM | POA: Insufficient documentation

## 2013-11-06 DIAGNOSIS — I739 Peripheral vascular disease, unspecified: Secondary | ICD-10-CM

## 2013-11-06 MED ORDER — TESTOSTERONE CYPIONATE 100 MG/ML IM SOLN: 200.0000 mg | Freq: Once | INTRAMUSCULAR | Status: DC

## 2013-11-06 MED ORDER — TESTOSTERONE CYPIONATE 100 MG/ML IM SOLN
200.0000 mg | Freq: Once | INTRAMUSCULAR | Status: AC
  Administered 2013-11-06: 200 mg via INTRAMUSCULAR

## 2013-11-07 LAB — HEPATIC FUNCTION PANEL
AST: 30 U/L (ref 0–37)
Albumin: 4.9 g/dL (ref 3.5–5.2)
Alkaline Phosphatase: 71 U/L (ref 39–117)
Bilirubin, Direct: 0.1 mg/dL (ref 0.0–0.3)
Total Protein: 7.6 g/dL (ref 6.0–8.3)

## 2013-11-22 ENCOUNTER — Ambulatory Visit: Payer: Medicare Other | Admitting: *Deleted

## 2013-11-22 DIAGNOSIS — E7800 Pure hypercholesterolemia, unspecified: Secondary | ICD-10-CM

## 2013-11-22 DIAGNOSIS — E785 Hyperlipidemia, unspecified: Secondary | ICD-10-CM

## 2013-11-23 ENCOUNTER — Ambulatory Visit (INDEPENDENT_AMBULATORY_CARE_PROVIDER_SITE_OTHER): Payer: Medicare Other | Admitting: *Deleted

## 2013-11-23 DIAGNOSIS — E7800 Pure hypercholesterolemia, unspecified: Secondary | ICD-10-CM

## 2013-11-23 LAB — LIPID PANEL
Cholesterol: 155 mg/dL (ref 0–200)
HDL: 42.8 mg/dL (ref >39.00)
LDL Cholesterol: 89 mg/dL (ref 0–99)
TRIGLYCERIDES: 118 mg/dL (ref 0.0–149.0)
Total CHOL/HDL Ratio: 4
VLDL: 23.6 mg/dL (ref 0.0–40.0)

## 2013-11-23 LAB — HEPATIC FUNCTION PANEL
ALBUMIN: 4.5 g/dL (ref 3.5–5.2)
ALT: 30 U/L (ref 0–53)
AST: 29 U/L (ref 0–37)
Alkaline Phosphatase: 62 U/L (ref 39–117)
BILIRUBIN DIRECT: 0 mg/dL (ref 0.0–0.3)
Total Bilirubin: 0.6 mg/dL (ref 0.3–1.2)
Total Protein: 8 g/dL (ref 6.0–8.3)

## 2013-11-28 ENCOUNTER — Ambulatory Visit (INDEPENDENT_AMBULATORY_CARE_PROVIDER_SITE_OTHER): Payer: Medicare Other | Admitting: Cardiovascular Disease

## 2013-11-28 ENCOUNTER — Encounter: Payer: Self-pay | Admitting: Cardiovascular Disease

## 2013-11-28 VITALS — BP 133/79 | HR 60 | Ht 70.0 in | Wt 225.4 lb

## 2013-11-28 DIAGNOSIS — I739 Peripheral vascular disease, unspecified: Secondary | ICD-10-CM

## 2013-11-28 DIAGNOSIS — I2510 Atherosclerotic heart disease of native coronary artery without angina pectoris: Secondary | ICD-10-CM

## 2013-11-28 NOTE — Progress Notes (Signed)
HPI: This is a 58 year old male who is here today for a followup visit regarding  peripheral arterial disease. He has known history of coronary artery disease with multiple interventions in the past. Cardiac catheterization in November of 2011 showed severe three-vessel coronary disease and he underwent coronary artery bypass graft surgery. He also had SFA stents placed bilaterally years ago. He quit smoking in 2009.  He was seen in 08/2013 for significant worsening of Claudication and cramping of left calf.  I proceeded with angiography which showed an occluded distal left SFA starting above the previously placed stent. I performed directional atherectomy and balloon angioplasty which established normal flow.Post procedure ABI was normal.  He is concerned about numbness of both feet and discomfort when he walks. He feels like he is walking on hot rocks. He denies calf claudication.  No Known Allergies   Current Outpatient Prescriptions on File Prior to Visit  Medication Sig Dispense Refill  . aspirin EC 81 MG tablet Take 81 mg by mouth daily.        [name] atorvastatin (LIPITOR) 40 MG tablet Take 1 tablet (40 mg total) by mouth daily.  90 tablet  3  . clopidogrel (PLAVIX) 75 MG tablet Take 1 tablet (75 mg total) by mouth daily.  90 tablet  3  . fish oil-omega-3 fatty acids 1000 MG capsule Take 1 capsule (1 g total) by mouth daily.  60 capsule  1  . lisinopril (PRINIVIL,ZESTRIL) 2.5 MG tablet Take 1 tablet (2.5 mg total) by mouth daily.  90 tablet  3  . meclizine (ANTIVERT) 12.5 MG tablet Take 1 tablet (12.5 mg total) by mouth 3 (three) times daily as needed for dizziness (also available over the counter).  30 tablet  3  . metoprolol tartrate (LOPRESSOR) 25 MG tablet TAKE 1 TABLET (25 MG TOTAL) BY MOUTH 2 (TWO) TIMES DAILY.  60 tablet  PRN  . Multiple Vitamin (MULTIVITAMIN) tablet Take 1 tablet by mouth daily.  30 tablet  10  . nitroGLYCERIN (NITROSTAT) 0.4 MG SL tablet Place 1 tablet (0.4 mg  total) under the tongue every 5 (five) minutes as needed for chest pain.  15 tablet  0  . tadalafil (CIALIS) 20 MG tablet Take 0.5-1 tablets (10-20 mg total) by mouth every other day as needed for erectile dysfunction.  5 tablet  11   Current Facility-Administered Medications on File Prior to Visit  Medication Dose Route Frequency Provider Last Rate Last Dose  . testosterone cypionate (DEPOTESTOTERONE CYPIONATE) injection 200 mg  200 mg Intramuscular Once Angelica Chessman, MD         Past Medical History  Diagnosis Date  . PAD (peripheral artery disease)     a. s/p bilat SFA stents;  b. ABIs 11/2012: R 0.99, L 0.86.  [name] Hypertension   . Hyperlipidemia   . Pancreatic cancer     surgery 2009  . Anemia, unspecified   . GERD (gastroesophageal reflux disease)   . Helicobacter pylori (H. pylori) infection   . Hemorrhoids   . Impotence of organic origin   . Bell's palsy   . CAD (coronary artery disease)     a. s/p multiple PCIs;  b. s/p CABG in 09/2010 (LIMA-LAD, SVG-OM1, SVG-distal RCA/OM2);  c.  Myoview 05/2012: EF 54%, no ischemia, no scar.   . Blood transfusion     during treatment for Ca  . DJD (degenerative joint disease)     low back & all over      Past  Surgical History  Procedure Laterality Date  . Pancreatic  cancer  05/10/2008    Resection of distal panrease and spleen  . Splenectomy    . Coronary artery bypass graft  nov 2011  . Back surgery      1992  . Cardiac catheterization    . Hemorrhoid surgery  10/30/2011    Procedure: HEMORRHOIDECTOMY;  Surgeon: Harl Bowie, MD;  Location: [facility] OR;  Service: General;  Laterality: N/A;     Family History  Problem Relation Age of Onset  . Heart attack Father     died of MI at age 30  . Cancer Father     pt unaware of what kind  . Anesthesia problems Neg Hx   . Hypotension Neg Hx   . Malignant hyperthermia Neg Hx   . Pseudochol deficiency Neg Hx      History   Social History  . Marital Status: Single     Spouse Name: N/A    Number of Children: 0  . Years of Education: N/A   Occupational History  .     Social History Main Topics  . Smoking status: Former Smoker    Types: Cigarettes    Quit date: 11/29/2008  . Smokeless tobacco: Never Used  . Alcohol Use: 1.2 oz/week    2 Glasses of wine per week     Comment: Once a week   . Drug Use: No  . Sexual Activity: Not on file   Other Topics Concern  . 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 He appears well-developed and well-nourished. No distress.  HENT: No nasal discharge.  Head: Normocephalic and atraumatic.  Eyes: Pupils are equal and round. Right eye exhibits no discharge. Left eye exhibits no discharge.  Neck: Normal range of motion. Neck supple. No JVD present. No thyromegaly present.  Cardiovascular: Normal rate, regular rhythm, normal heart sounds and. Exam reveals no gallop and no friction rub. No murmur heard.  Pulmonary/Chest: Effort normal and breath sounds normal. No stridor. No respiratory distress. He has no wheezes. He has no rales. He exhibits no tenderness.  Abdominal: Soft. Bowel sounds are normal. He exhibits no distension. There is no tenderness. There is no rebound and no guarding.  Musculoskeletal: Normal range of motion. He exhibits no edema and no tenderness.  Neurological: He is alert and oriented to person, place, and time. Coordination normal.  Skin: Skin is warm and dry. No rash noted. He is not diaphoretic. No erythema. No pallor.  Psychiatric: He has a normal mood and affect. His behavior is normal. Judgment and thought content normal.  Vascular: Femoral pulses are slightly diminished bilaterally. PT: +2 in the right side and 0 on  the left side, DP: + 1 On the right side and +1 on the left side.      ASSESSMENT AND PLAN

## 2013-11-28 NOTE — Patient Instructions (Signed)
Return to see Dr.Arida in May 2015 after leg study is completed.

## 2013-12-04 ENCOUNTER — Ambulatory Visit: Payer: Medicare Other | Attending: Internal Medicine

## 2013-12-04 VITALS — BP 122/82 | HR 76 | Temp 98.2°F | Resp 16

## 2013-12-04 DIAGNOSIS — R7989 Other specified abnormal findings of blood chemistry: Secondary | ICD-10-CM

## 2013-12-04 MED ORDER — TESTOSTERONE ENANTHATE 200 MG/ML IM SOLN
200.0000 mg | Freq: Once | INTRAMUSCULAR | Status: AC
  Administered 2013-12-04: 200 mg via INTRAMUSCULAR

## 2013-12-04 NOTE — Progress Notes (Unsigned)
Patient here for testosterone injection 

## 2013-12-05 ENCOUNTER — Encounter: Payer: Self-pay | Admitting: Cardiovascular Disease

## 2013-12-05 NOTE — Assessment & Plan Note (Signed)
He has no symptoms of angina. Continue medical therapy.

## 2013-12-05 NOTE — Assessment & Plan Note (Signed)
His current symptoms are not suggestive of claudication. The more suggestive of peripheral neuropathy or plantar fasciitis. I asked him to followup with his primary care physician for further evaluation of this. His dorsalis pedis pulse on the left side is palpable and unchanged from post revascularization. Thus, I doubt obstructive disease on the left side at this time. He is due for lower extremity arterial duplex anyway in few months. Continue current treatment.

## 2013-12-09 ENCOUNTER — Telehealth: Payer: Self-pay | Admitting: Emergency Medicine

## 2013-12-09 NOTE — Telephone Encounter (Signed)
Message copied by Ricci Barker on Sat Dec 09, 2013 11:05 AM ------      Message from: Tresa Garter      Created: Fri Dec 08, 2013  5:18 PM       Posterior patient that his cholesterol level and hepatic panel are all within normal limit ------

## 2013-12-09 NOTE — Telephone Encounter (Signed)
Left message for pt to call for test results

## 2013-12-11 ENCOUNTER — Telehealth: Payer: Self-pay | Admitting: Emergency Medicine

## 2013-12-11 NOTE — Progress Notes (Signed)
Patient ID: Steven Hale, male   DOB: 12-16-1955, 58 y.o.   MRN: [id number] None

## 2013-12-11 NOTE — Telephone Encounter (Signed)
Pt given lab results 

## 2013-12-18 ENCOUNTER — Ambulatory Visit: Payer: Medicare Other | Attending: Internal Medicine

## 2013-12-18 DIAGNOSIS — R7989 Other specified abnormal findings of blood chemistry: Secondary | ICD-10-CM

## 2013-12-19 LAB — TESTOSTERONE: Testosterone: 609 ng/dL (ref 300–890)

## 2013-12-21 ENCOUNTER — Telehealth: Payer: Self-pay | Admitting: Emergency Medicine

## 2013-12-21 ENCOUNTER — Telehealth: Payer: Self-pay | Admitting: Internal Medicine

## 2013-12-21 NOTE — Telephone Encounter (Signed)
Pt calling and would like to speak to nurse. Please f/u with pt.  °

## 2013-12-21 NOTE — Telephone Encounter (Signed)
Message copied by Ricci Barker on Thu Dec 21, 2013  8:53 AM ------      Message from: Tresa Garter      Created: Tue Dec 19, 2013  5:16 PM       Please inform patient that her testosterone level is within normal limit for adult male ------

## 2013-12-22 ENCOUNTER — Telehealth: Payer: Self-pay | Admitting: Emergency Medicine

## 2013-12-22 NOTE — Telephone Encounter (Signed)
Pt

## 2013-12-29 NOTE — Telephone Encounter (Signed)
Message copied by Johny Shears on Fri Dec 29, 2013  2:24 PM ------      Message from: Angelica Chessman E      Created: Fri Dec 08, 2013  5:18 PM       Posterior patient that his cholesterol level and hepatic panel are all within normal limit ------

## 2014-01-01 ENCOUNTER — Ambulatory Visit: Payer: Medicare Other | Attending: Internal Medicine

## 2014-01-01 DIAGNOSIS — N529 Male erectile dysfunction, unspecified: Secondary | ICD-10-CM | POA: Diagnosis not present

## 2014-01-01 DIAGNOSIS — R7989 Other specified abnormal findings of blood chemistry: Secondary | ICD-10-CM

## 2014-01-01 DIAGNOSIS — E349 Endocrine disorder, unspecified: Secondary | ICD-10-CM | POA: Insufficient documentation

## 2014-01-01 DIAGNOSIS — E291 Testicular hypofunction: Secondary | ICD-10-CM

## 2014-01-01 MED ORDER — TESTOSTERONE CYPIONATE 100 MG/ML IM SOLN
200.0000 mg | Freq: Once | INTRAMUSCULAR | Status: AC
  Administered 2014-01-01: 200 mg via INTRAMUSCULAR

## 2014-01-01 NOTE — Progress Notes (Signed)
Pt is here for test. Inj. only

## 2014-01-01 NOTE — Patient Instructions (Signed)
Pt is to return in 28 days for another inj.

## 2014-01-11 ENCOUNTER — Ambulatory Visit: Payer: Self-pay | Admitting: Internal Medicine

## 2014-01-12 ENCOUNTER — Other Ambulatory Visit: Payer: Self-pay

## 2014-01-29 ENCOUNTER — Other Ambulatory Visit: Payer: Self-pay

## 2014-02-15 ENCOUNTER — Encounter (HOSPITAL_COMMUNITY): Payer: Self-pay | Admitting: Emergency Medicine

## 2014-02-15 ENCOUNTER — Emergency Department (INDEPENDENT_AMBULATORY_CARE_PROVIDER_SITE_OTHER)
Admission: EM | Admit: 2014-02-15 | Discharge: 2014-02-15 | Disposition: A | Discharge status: Home / Self Care | Payer: Medicare HMO | Source: Home / Self Care | Attending: Emergency Medicine | Admitting: Emergency Medicine

## 2014-02-15 DIAGNOSIS — M2550 Pain in unspecified joint: Secondary | ICD-10-CM

## 2014-02-15 MED ORDER — MELOXICAM 15 MG PO TABS
15.0000 mg | ORAL_TABLET | Freq: Every day | ORAL | Status: DC
Start: 2014-02-15 — End: 2014-02-24

## 2014-02-15 MED ORDER — TRAMADOL HCL 50 MG PO TABS: 100.0000 mg | ORAL_TABLET | Freq: Three times a day (TID) | ORAL | Status: DC | PRN

## 2014-02-15 NOTE — Discharge Instructions (Signed)
Arthralgia  Your caregiver has diagnosed you as suffering from an arthralgia. Arthralgia means there is pain in a joint. This can come from many reasons including:  · Bruising the joint which causes soreness (inflammation) in the joint.  · Wear and tear on the joints which occur as we grow older (osteoarthritis).  · Overusing the joint.  · Various forms of arthritis.  · Infections of the joint.  Regardless of the cause of pain in your joint, most of these different pains respond to anti-inflammatory drugs and rest. The exception to this is when a joint is infected, and these cases are treated with antibiotics, if it is a bacterial infection.  HOME CARE INSTRUCTIONS   · Rest the injured area for as long as directed by your caregiver. Then slowly start using the joint as directed by your caregiver and as the pain allows. Crutches as directed may be useful if the ankles, knees or hips are involved. If the knee was splinted or casted, continue use and care as directed. If an stretchy or elastic wrapping bandage has been applied today, it should be removed and re-applied every 3 to 4 hours. It should not be applied tightly, but firmly enough to keep swelling down. Watch toes and feet for swelling, bluish discoloration, coldness, numbness or excessive pain. If any of these problems (symptoms) occur, remove the ace bandage and re-apply more loosely. If these symptoms persist, contact your caregiver or return to this location.  · For the first 24 hours, keep the injured extremity elevated on pillows while lying down.  · Apply ice for 15-20 minutes to the sore joint every couple hours while awake for the first half day. Then 03-04 times per day for the first 48 hours. Put the ice in a plastic bag and place a towel between the bag of ice and your skin.  · Wear any splinting, casting, elastic bandage applications, or slings as instructed.  · Only take over-the-counter or prescription medicines for pain, discomfort, or fever as  directed by your caregiver. Do not use aspirin immediately after the injury unless instructed by your physician. Aspirin can cause increased bleeding and bruising of the tissues.  · If you were given crutches, continue to use them as instructed and do not resume weight bearing on the sore joint until instructed.  Persistent pain and inability to use the sore joint as directed for more than 2 to 3 days are warning signs indicating that you should see a caregiver for a follow-up visit as soon as possible. Initially, a hairline fracture (break in bone) may not be evident on X-rays. Persistent pain and swelling indicate that further evaluation, non-weight bearing or use of the joint (use of crutches or slings as instructed), or further X-rays are indicated. X-rays may sometimes not show a small fracture until a week or 10 days later. Make a follow-up appointment with your own caregiver or one to whom we have referred you. A radiologist (specialist in reading X-rays) may read your X-rays. Make sure you know how you are to obtain your X-ray results. Do not assume everything is normal if you do not hear from us.  SEEK MEDICAL CARE IF:  Bruising, swelling, or pain increases.  SEEK IMMEDIATE MEDICAL CARE IF:   · Your fingers or toes are numb or blue.  · The pain is not responding to medications and continues to stay the same or get worse.  · The pain in your joint becomes severe.  · You   develop a fever over 102° F (38.9° C).  · It becomes impossible to move or use the joint.  MAKE SURE YOU:   · Understand these instructions.  · Will watch your condition.  · Will get help right away if you are not doing well or get worse.  Document Released: 11/02/2005 Document Revised: 01/25/2012 Document Reviewed: 06/20/2008  [employer]® Patient Information ©2014 [employer].

## 2014-02-15 NOTE — ED Notes (Signed)
C/o  Hurting all over.  Symptoms come and go.  A flare up may last two to three days.  Mild relief with prescribed meds.

## 2014-02-15 NOTE — ED Provider Notes (Signed)
Chief Complaint    Chief Complaint  Patient presents with  . Joint Pain    History of Present Illness     Steven Hale is a 58 year old male who has had a two-year history of generalized joint pain. The episodes occur about once per month and last about 3 days at a time. During these episodes he has generalized all over soreness including his muscles and his joints. His face become swollen and scaly. His joints well as well. There is nothing that seems to precipitate these attacks and nothing that seems to relieve it. He was getting physical therapy for his right knee and his back, and his therapist suggested he might have fibromyalgia. He just over arthroscopic surgery for his knee. He denies any fever, chills, sweats, weight loss, skin rash, headache, redness of the eyes, drainage, sore throat, oral ulcerations, adenopathy, coughing, shortness of breath, chest pain, abdominal pain, diarrhea, or urinary symptoms. He has had pain in his back and his neck.  Review of Systems     Other than as noted above, the patient denies any of the following symptoms: Systemic:  No fevers, chills, sweats, or muscle aches.  No weight loss.  Eye:  No redness, pain, or discharge. ENT:  No oral ulcerations or sore throat. Respiratory:  No shortness of breath or cough. Cardiovascular:  No chest pain. GI:  No abdominal pain or diarrhea. GU:  No dysuria or discharge. Musculoskeletal:  No back pain or neck pain. Neurological:  No muscular weakness, paresthesias, or headache.  [place]    Past medical history, family history, social history, meds, and allergies were reviewed.  No medication allergies. Current meds include aspirin, Lipitor, Plavix, omega-3 fatty acids, lisinopril, meclizine, Lopressor, multivitamin, Nitrostat, and Cialis.  Physical Exam    Vital signs:  BP 130/80  Pulse 60  Temp(Src) 97.9 F (36.6 C) (Oral)  Resp 16  SpO2 98% Gen:  Alert and oriented times 3.  In no distress. Eyes:  Lids  normal, no conjunctival injection or discharge. ENT:  No oral ulcerations or lesions.  Pharynx clear. Neck:  No adenopathy. Lungs:  Clear to auscultation. Heart:  Regular rhythm, no gallop or murmer.  Abdomen:  No tenderness, organomegaly or mass.  Musculoskeletal: Joints are weight today is completely unremarkable. There is no pain to palpation, no swelling, and all joints have full range of motion without any pain.  No edema, pulses full. Extremities were warm and pink.  Capillary refill was brisk.  Skin:  Clear, warm and dry.  No rash. Neuro:  Alert and oriented times 3.  Muscle strength was normal.  Sensation was intact to light touch.   Assessment    The encounter diagnosis was Arthralgia.  He has intermittent, generalized arthralgias and myalgias. This is not typical for fibromyalgia and that the symptoms come and go. They're also accompanied by facial rash and swelling which are not typical for fibromyalgia. Other diagnostic possibilities include palindromic rheumatism, rheumatoid arthritis, lupus, reactive arthritis, gout, pseudogout, or Lyme disease. He is to be worked up for all these things. He will need followup with rheumatology.  Plan   1.  Meds:  The following meds were prescribed:   Discharge Medication List as of 02/15/2014  2:08 PM    START taking these medications   Details  meloxicam (MOBIC) 15 MG tablet Take 1 tablet (15 mg total) by mouth daily., Starting 02/15/2014, Until Discontinued, Normal    traMADol (ULTRAM) 50 MG tablet Take 2 tablets (100 mg total)  by mouth every 8 (eight) hours as needed., Starting 02/15/2014, Until Discontinued, Normal        2.  Patient Education/Counseling:  The patient was given appropriate handouts, self care instructions, and instructed in symptomatic relief, including rest and activity and application of heat or ice.    3.  Follow up:  The patient was told to follow up here if no better in 3 to 4 days, or sooner if becoming worse in any  way, and given some red flag symptoms such as worsening pain or new neurological symptoms which would prompt immediate return.  Followup with Dr. Benson Norway.   Harden Mo, MD 02/15/14 [phone]

## 2014-02-19 ENCOUNTER — Emergency Department (HOSPITAL_COMMUNITY)
Admission: EM | Admit: 2014-02-19 | Discharge: 2014-02-19 | Disposition: A | Discharge status: Home / Self Care | Payer: Medicare HMO | Attending: Emergency Medicine | Admitting: Emergency Medicine

## 2014-02-19 ENCOUNTER — Encounter (HOSPITAL_COMMUNITY): Payer: Self-pay | Admitting: Emergency Medicine

## 2014-02-19 DIAGNOSIS — M549 Dorsalgia, unspecified: Secondary | ICD-10-CM

## 2014-02-19 DIAGNOSIS — IMO0002 Reserved for concepts with insufficient information to code with codable children: Secondary | ICD-10-CM | POA: Insufficient documentation

## 2014-02-19 DIAGNOSIS — I739 Peripheral vascular disease, unspecified: Secondary | ICD-10-CM | POA: Insufficient documentation

## 2014-02-19 DIAGNOSIS — Z87891 Personal history of nicotine dependence: Secondary | ICD-10-CM | POA: Insufficient documentation

## 2014-02-19 DIAGNOSIS — I2510 Atherosclerotic heart disease of native coronary artery without angina pectoris: Secondary | ICD-10-CM | POA: Diagnosis not present

## 2014-02-19 DIAGNOSIS — K219 Gastro-esophageal reflux disease without esophagitis: Secondary | ICD-10-CM | POA: Diagnosis not present

## 2014-02-19 DIAGNOSIS — G8929 Other chronic pain: Secondary | ICD-10-CM | POA: Insufficient documentation

## 2014-02-19 DIAGNOSIS — Z79899 Other long term (current) drug therapy: Secondary | ICD-10-CM | POA: Diagnosis not present

## 2014-02-19 DIAGNOSIS — Z7982 Long term (current) use of aspirin: Secondary | ICD-10-CM | POA: Insufficient documentation

## 2014-02-19 DIAGNOSIS — Z951 Presence of aortocoronary bypass graft: Secondary | ICD-10-CM | POA: Diagnosis not present

## 2014-02-19 DIAGNOSIS — N529 Male erectile dysfunction, unspecified: Secondary | ICD-10-CM | POA: Diagnosis not present

## 2014-02-19 DIAGNOSIS — I10 Essential (primary) hypertension: Secondary | ICD-10-CM | POA: Diagnosis not present

## 2014-02-19 DIAGNOSIS — E785 Hyperlipidemia, unspecified: Secondary | ICD-10-CM | POA: Insufficient documentation

## 2014-02-19 DIAGNOSIS — Z8509 Personal history of malignant neoplasm of other digestive organs: Secondary | ICD-10-CM | POA: Diagnosis not present

## 2014-02-19 DIAGNOSIS — Z7901 Long term (current) use of anticoagulants: Secondary | ICD-10-CM | POA: Diagnosis not present

## 2014-02-19 MED ORDER — ONDANSETRON 8 MG PO TBDP
8.0000 mg | ORAL_TABLET | Freq: Once | ORAL | Status: AC
  Administered 2014-02-19: 8 mg via ORAL
  Filled 2014-02-19: qty 1

## 2014-02-19 MED ORDER — DIAZEPAM 5 MG PO TABS: 5.0000 mg | ORAL_TABLET | Freq: Two times a day (BID) | ORAL | Status: DC

## 2014-02-19 MED ORDER — OXYCODONE-ACETAMINOPHEN 5-325 MG PO TABS: 1.0000 | ORAL_TABLET | Freq: Four times a day (QID) | ORAL | Status: DC | PRN

## 2014-02-19 MED ORDER — PROMETHAZINE HCL 25 MG PO TABS: 25.0000 mg | ORAL_TABLET | Freq: Four times a day (QID) | ORAL | Status: DC | PRN

## 2014-02-19 MED ORDER — DIAZEPAM 5 MG PO TABS
10.0000 mg | ORAL_TABLET | Freq: Once | ORAL | Status: AC
  Administered 2014-02-19: 10 mg via ORAL
  Filled 2014-02-19: qty 2

## 2014-02-19 MED ORDER — OXYCODONE-ACETAMINOPHEN 5-325 MG PO TABS
2.0000 | ORAL_TABLET | Freq: Once | ORAL | Status: DC
  Filled 2014-02-19: qty 2

## 2014-02-19 MED ORDER — HYDROMORPHONE HCL PF 2 MG/ML IJ SOLN
2.0000 mg | Freq: Once | INTRAMUSCULAR | Status: AC
  Administered 2014-02-19: 2 mg via INTRAMUSCULAR
  Filled 2014-02-19: qty 1

## 2014-02-19 NOTE — Discharge Instructions (Signed)
Back Pain, Adult Low back pain is very common. About 1 in 5 people have back pain.The cause of low back pain is rarely dangerous. The pain often gets better over time.About half of people with a sudden onset of back pain feel better in just 2 weeks. About 8 in 10 people feel better by 6 weeks.  CAUSES Some common causes of back pain include:  Strain of the muscles or ligaments supporting the spine.  Wear and tear (degeneration) of the spinal discs.  Arthritis.  Direct injury to the back. DIAGNOSIS Most of the time, the direct cause of low back pain is not known.However, back pain can be treated effectively even when the exact cause of the pain is unknown.Answering your caregiver's questions about your overall health and symptoms is one of the most accurate ways to make sure the cause of your pain is not dangerous. If your caregiver needs more information, he or she may order lab work or imaging tests (X-rays or MRIs).However, even if imaging tests show changes in your back, this usually does not require surgery. HOME CARE INSTRUCTIONS For many people, back pain returns.Since low back pain is rarely dangerous, it is often a condition that people can learn to manageon their own.   Remain active. It is stressful on the back to sit or stand in one place. Do not sit, drive, or stand in one place for more than 30 minutes at a time. Take short walks on level surfaces as soon as pain allows.Try to increase the length of time you walk each day.  Do not stay in bed.Resting more than 1 or 2 days can delay your recovery.  Do not avoid exercise or work.Your body is made to move.It is not dangerous to be active, even though your back may hurt.Your back will likely heal faster if you return to being active before your pain is gone.  Pay attention to your body when you bend and lift. Many people have less discomfortwhen lifting if they bend their knees, keep the load close to their bodies,and  avoid twisting. Often, the most comfortable positions are those that put less stress on your recovering back.  Find a comfortable position to sleep. Use a firm mattress and lie on your side with your knees slightly bent. If you lie on your back, put a pillow under your knees.  Only take over-the-counter or prescription medicines as directed by your caregiver. Over-the-counter medicines to reduce pain and inflammation are often the most helpful.Your caregiver may prescribe muscle relaxant drugs.These medicines help dull your pain so you can more quickly return to your normal activities and healthy exercise.  Put ice on the injured area.  Put ice in a plastic bag.  Place a towel between your skin and the bag.  Leave the ice on for 15-20 minutes, 03-04 times a day for the first 2 to 3 days. After that, ice and heat may be alternated to reduce pain and spasms.  Ask your caregiver about trying back exercises and gentle massage. This may be of some benefit.  Avoid feeling anxious or stressed.Stress increases muscle tension and can worsen back pain.It is important to recognize when you are anxious or stressed and learn ways to manage it.Exercise is a great option. SEEK MEDICAL CARE IF:  You have pain that is not relieved with rest or medicine.  You have pain that does not improve in 1 week.  You have new symptoms.  You are generally not feeling well. SEEK   IMMEDIATE MEDICAL CARE IF:   You have pain that radiates from your back into your legs.  You develop new bowel or bladder control problems.  You have unusual weakness or numbness in your arms or legs.  You develop nausea or vomiting.  You develop abdominal pain.  You feel faint. Document Released: 11/02/2005 Document Revised: 05/03/2012 Document Reviewed: 03/23/2011 Exit[employer] Patient Information 2014 [employer].  

## 2014-02-19 NOTE — ED Provider Notes (Signed)
Medical screening examination/treatment/procedure(s) were performed by non-physician practitioner and as supervising physician I was immediately available for consultation/collaboration.    Kathalene Frames, MD 02/19/14 1005

## 2014-02-19 NOTE — ED Provider Notes (Signed)
CSN: [id number]     Arrival date & time 02/19/14  1027 History   First MD Initiated Contact with Patient 02/19/14 [phone]     Chief Complaint  Patient presents with  . Back Pain     (Consider location/radiation/quality/duration/timing/severity/associated sxs/prior Treatment) HPI Comments: Patient is a 58 year old male with history of PAD, hypertension, hyperlipidemia, GERD, chronic back pain who presents today with back pain. He reports that this pain has been ongoing for the past 2 years, but worsened on Saturday while he was walking up stairs. There was no fall or trauma to the area. He describes the pain as sharp and worse with ambulation. The pain radiates into his right leg. He also complains of bilateral burning in his feet. The patient specifically denies being diabetic. He has been taking him home pain medication with no relief. He reports that Tramadol has not improved his pain at all. He has been taking Gabapentin for the past 2 weeks without improvement of his symptoms. He sees Dr. Rolena Infante who was doing spinal injections for pain. These have also seemed to stop working. He denies bowel or bladder incontinence, drug abuse, fevers, chills, nausea, vomiting, abdominal pain. He denies history of cancer, stating he had a cyst removed from his pancreas in 2009.   The history is provided by the patient. No language interpreter was used.    Past Medical History  Diagnosis Date  . PAD (peripheral artery disease)     a. s/p bilat SFA stents;  b. ABIs 11/2012: R 0.99, L 0.86.  [name] Hypertension   . Hyperlipidemia   . Anemia, unspecified   . GERD (gastroesophageal reflux disease)   . Helicobacter pylori (H. pylori) infection   . Hemorrhoids   . Impotence of organic origin   . Bell's palsy   . CAD (coronary artery disease)     a. s/p multiple PCIs;  b. s/p CABG in 09/2010 (LIMA-LAD, SVG-OM1, SVG-distal RCA/OM2);  c.  Myoview 05/2012: EF 54%, no ischemia, no scar.   . Blood transfusion     during  treatment for Ca  . DJD (degenerative joint disease)     low back & all over   . Pancreatic cancer     surgery 2009   Past Surgical History  Procedure Laterality Date  . Pancreatic  cancer  05/10/2008    Resection of distal panrease and spleen  . Splenectomy    . Coronary artery bypass graft  nov 2011  . Back surgery      1992  . Cardiac catheterization    . Hemorrhoid surgery  10/30/2011    Procedure: HEMORRHOIDECTOMY;  Surgeon: Harl Bowie, MD;  Location: [facility] OR;  Service: General;  Laterality: N/A;   Family History  Problem Relation Age of Onset  . Heart attack Father     died of MI at age 68  . Cancer Father     pt unaware of what kind  . Anesthesia problems Neg Hx   . Hypotension Neg Hx   . Malignant hyperthermia Neg Hx   . Pseudochol deficiency Neg Hx    History  Substance Use Topics  . Smoking status: Former Smoker    Types: Cigarettes    Quit date: 11/29/2008  . Smokeless tobacco: Never Used  . Alcohol Use: 1.2 oz/week    2 Glasses of wine per week     Comment: Once a week     Review of Systems  Constitutional: Negative for fever and chills.  Respiratory: Negative for shortness of breath.   Cardiovascular: Negative for chest pain.  Gastrointestinal: Negative for nausea, vomiting and abdominal pain.  Musculoskeletal: Positive for back pain, gait problem and myalgias.  All other systems reviewed and are negative.      Allergies  Review of patient's allergies indicates no known allergies.  Home Medications   Current Outpatient Rx  Name  Route  Sig  Dispense  Refill  . aspirin EC 81 MG tablet   Oral   Take 81 mg by mouth daily.           [name] atorvastatin (LIPITOR) 40 MG tablet   Oral   Take 1 tablet (40 mg total) by mouth daily.   90 tablet   3   . celecoxib (CELEBREX) 100 MG capsule   Oral   Take 100 mg by mouth 2 (two) times daily.         . clopidogrel (PLAVIX) 75 MG tablet   Oral   Take 1 tablet (75 mg total) by mouth daily.    90 tablet   3   . fish oil-omega-3 fatty acids 1000 MG capsule   Oral   Take 1 capsule (1 g total) by mouth daily.   60 capsule   1   . gabapentin (NEURONTIN) 100 MG capsule   Oral   Take 100 mg by mouth 3 (three) times daily. 200 mg in the morning 100 mg at noon 200 mg at bedtime         . lisinopril (PRINIVIL,ZESTRIL) 2.5 MG tablet   Oral   Take 1 tablet (2.5 mg total) by mouth daily.   90 tablet   3   . metoprolol tartrate (LOPRESSOR) 25 MG tablet   Oral   Take 25 mg by mouth 2 (two) times daily.         . Multiple Vitamin (MULTIVITAMIN) tablet   Oral   Take 1 tablet by mouth daily.   30 tablet   10   . tapentadol (NUCYNTA) 50 MG TABS tablet   Oral   Take 50 mg by mouth every 4 (four) hours as needed for severe pain.         . traMADol (ULTRAM) 50 MG tablet   Oral   Take 2 tablets (100 mg total) by mouth every 8 (eight) hours as needed.   30 tablet   1   . meclizine (ANTIVERT) 12.5 MG tablet   Oral   Take 1 tablet (12.5 mg total) by mouth 3 (three) times daily as needed for dizziness (also available over the counter).   30 tablet   3   . meloxicam (MOBIC) 15 MG tablet   Oral   Take 1 tablet (15 mg total) by mouth daily.   30 tablet   1   . nitroGLYCERIN (NITROSTAT) 0.4 MG SL tablet   Sublingual   Place 1 tablet (0.4 mg total) under the tongue every 5 (five) minutes as needed for chest pain.   15 tablet   0   . tadalafil (CIALIS) 20 MG tablet   Oral   Take 0.5-1 tablets (10-20 mg total) by mouth every other day as needed for erectile dysfunction.   5 tablet   11    BP 140/80  Pulse 65  Temp(Src) 98.2 F (36.8 C) (Oral)  Resp 18  SpO2 98% Physical Exam  Nursing note and vitals reviewed. Constitutional: He is oriented to person, place, and time. He appears well-developed and well-nourished. No distress.  HENT:  Head: Normocephalic and atraumatic.  Right Ear: External ear normal.  Left Ear: External ear normal.  Nose: Nose normal.   Eyes: Conjunctivae are normal.  Neck: Normal range of motion. No tracheal deviation present.  Cardiovascular: Normal rate, regular rhythm and normal heart sounds.   Pulmonary/Chest: Effort normal and breath sounds normal. No stridor.  Abdominal: Soft. He exhibits no distension. There is no tenderness.  Musculoskeletal: Normal range of motion.  No deformity or step offs on spine TTP over lumbar spine and right SI joint. No skin lesions or rash Log roll test negative bilaterally Straight leg raise negative bilaterally.  Sensation intact throughout lower extremity bilaterally.  Strength 5/5 in lower extremities bilaterally. Dorsi/plantar flexion and flexion/extenstion tested against resistance.  Gait is antalgic.   Neurological: He is alert and oriented to person, place, and time.  Skin: Skin is warm and dry. He is not diaphoretic.  Psychiatric: He has a normal mood and affect. His behavior is normal.    ED Course  Procedures (including critical care time) Labs Review Labs Reviewed - No data to display Imaging Review No results found.   EKG Interpretation None      MDM   Final diagnoses:  Back pain   Patient with back pain.  No neurological deficits and normal neuro exam.  Patient can walk but states is painful.  No loss of bowel or bladder control.  No concern for cauda equina.  No fever, night sweats, weight loss, h/o cancer, IVDU.  RICE protocol and pain medicine indicated and discussed with patient.    Elwyn Lade, PA-C 02/19/14 1000

## 2014-02-19 NOTE — ED Notes (Signed)
Patient waiting to leave until 1007, 15 minutes after receiving IM injection.

## 2014-02-19 NOTE — ED Notes (Signed)
Pt c/o chronic lower back pain states it got worse on Saturday. States at hurts on the bottom of feet as well.

## 2014-02-19 NOTE — ED Notes (Signed)
Patient was educated not to drive, operate heavy machinery, or drink alcohol while taking narcotic medication.  

## 2014-02-19 NOTE — Progress Notes (Signed)
[facility] provided pt with a list of primary care resources.  °

## 2014-02-24 ENCOUNTER — Emergency Department (HOSPITAL_COMMUNITY)
Admission: EM | Admit: 2014-02-24 | Discharge: 2014-02-24 | Disposition: A | Discharge status: Home / Self Care | Payer: Worker's Compensation | Attending: Emergency Medicine | Admitting: Emergency Medicine

## 2014-02-24 ENCOUNTER — Encounter (HOSPITAL_COMMUNITY): Payer: Self-pay | Admitting: Emergency Medicine

## 2014-02-24 DIAGNOSIS — M549 Dorsalgia, unspecified: Secondary | ICD-10-CM

## 2014-02-24 DIAGNOSIS — Z7982 Long term (current) use of aspirin: Secondary | ICD-10-CM | POA: Insufficient documentation

## 2014-02-24 DIAGNOSIS — Z951 Presence of aortocoronary bypass graft: Secondary | ICD-10-CM | POA: Insufficient documentation

## 2014-02-24 DIAGNOSIS — E785 Hyperlipidemia, unspecified: Secondary | ICD-10-CM | POA: Insufficient documentation

## 2014-02-24 DIAGNOSIS — M545 Low back pain, unspecified: Secondary | ICD-10-CM | POA: Insufficient documentation

## 2014-02-24 DIAGNOSIS — Z87891 Personal history of nicotine dependence: Secondary | ICD-10-CM | POA: Insufficient documentation

## 2014-02-24 DIAGNOSIS — Z87828 Personal history of other (healed) physical injury and trauma: Secondary | ICD-10-CM | POA: Insufficient documentation

## 2014-02-24 DIAGNOSIS — I2510 Atherosclerotic heart disease of native coronary artery without angina pectoris: Secondary | ICD-10-CM | POA: Insufficient documentation

## 2014-02-24 DIAGNOSIS — M1990 Unspecified osteoarthritis, unspecified site: Secondary | ICD-10-CM | POA: Insufficient documentation

## 2014-02-24 DIAGNOSIS — Z8509 Personal history of malignant neoplasm of other digestive organs: Secondary | ICD-10-CM | POA: Insufficient documentation

## 2014-02-24 DIAGNOSIS — Z79899 Other long term (current) drug therapy: Secondary | ICD-10-CM | POA: Insufficient documentation

## 2014-02-24 DIAGNOSIS — Z862 Personal history of diseases of the blood and blood-forming organs and certain disorders involving the immune mechanism: Secondary | ICD-10-CM | POA: Insufficient documentation

## 2014-02-24 DIAGNOSIS — Z8719 Personal history of other diseases of the digestive system: Secondary | ICD-10-CM | POA: Insufficient documentation

## 2014-02-24 DIAGNOSIS — Z791 Long term (current) use of non-steroidal anti-inflammatories (NSAID): Secondary | ICD-10-CM | POA: Insufficient documentation

## 2014-02-24 DIAGNOSIS — Z8619 Personal history of other infectious and parasitic diseases: Secondary | ICD-10-CM | POA: Insufficient documentation

## 2014-02-24 DIAGNOSIS — I10 Essential (primary) hypertension: Secondary | ICD-10-CM | POA: Insufficient documentation

## 2014-02-24 DIAGNOSIS — G8929 Other chronic pain: Secondary | ICD-10-CM | POA: Insufficient documentation

## 2014-02-24 DIAGNOSIS — Z7902 Long term (current) use of antithrombotics/antiplatelets: Secondary | ICD-10-CM | POA: Insufficient documentation

## 2014-02-24 DIAGNOSIS — Z87448 Personal history of other diseases of urinary system: Secondary | ICD-10-CM | POA: Insufficient documentation

## 2014-02-24 DIAGNOSIS — Z7189 Other specified counseling: Secondary | ICD-10-CM | POA: Insufficient documentation

## 2014-02-24 NOTE — ED Notes (Signed)
Pt reports bilateral hip and lower back pain for months. Worse more recently. Seen here earlier this week for same. Sts meds are not helping.

## 2014-02-24 NOTE — ED Provider Notes (Signed)
CSN: [id number]     Arrival date & time 02/24/14  1342 History  This chart was scribed for non-physician practitioner Harvie Heck working with [place], DO by Donato Schultz, ED Scribe. This patient was seen in room [place] and the patient's care was started at 1:54 PM.     Chief Complaint  Patient presents with  . Cough     HPI Comments: Steven Hale is a 58 y.o. male who presents to the Emergency Department complaining of constant right lower back pain radiating to his legs and feet bilaterally that started a couple of years ago after he injured his back at work. He denies recent injury, reports increase in discomfort for the past several weeks.  The patient states that movement and standing erect aggravates the pain.  He states that he has tried Meloxicam, Percocet, and Tramadol for his pain with no relief to his symptoms.  He states that he was referred to an orthopedist and was advised to have an emergency back operation several years ago but has never followed up with him in several years.  PCP: Angelica Chessman, MD at [facility].    The history is provided by the patient. No language interpreter was used.     Past Medical History  Diagnosis Date  . PAD (peripheral artery disease)     a. s/p bilat SFA stents;  b. ABIs 11/2012: R 0.99, L 0.86.  [name] Hypertension   . Hyperlipidemia   . Anemia, unspecified   . GERD (gastroesophageal reflux disease)   . Helicobacter pylori (H. pylori) infection   . Hemorrhoids   . Impotence of organic origin   . Bell's palsy   . CAD (coronary artery disease)     a. s/p multiple PCIs;  b. s/p CABG in 09/2010 (LIMA-LAD, SVG-OM1, SVG-distal RCA/OM2);  c.  Myoview 05/2012: EF 54%, no ischemia, no scar.   . Blood transfusion     during treatment for Ca  . DJD (degenerative joint disease)     low back & all over   . Pancreatic cancer     surgery 2009   Past Surgical History  Procedure Laterality Date  . Pancreatic  cancer   05/10/2008    Resection of distal panrease and spleen  . Splenectomy    . Coronary artery bypass graft  nov 2011  . Back surgery      1992  . Cardiac catheterization    . Hemorrhoid surgery  10/30/2011    Procedure: HEMORRHOIDECTOMY;  Surgeon: Harl Bowie, MD;  Location: [facility] OR;  Service: General;  Laterality: N/A;   Family History  Problem Relation Age of Onset  . Heart attack Father     died of MI at age 50  . Cancer Father     pt unaware of what kind  . Anesthesia problems Neg Hx   . Hypotension Neg Hx   . Malignant hyperthermia Neg Hx   . Pseudochol deficiency Neg Hx    History  Substance Use Topics  . Smoking status: Former Smoker    Types: Cigarettes    Quit date: 11/29/2008  . Smokeless tobacco: Never Used  . Alcohol Use: 1.2 oz/week    2 Glasses of wine per week     Comment: Once a week     Review of Systems  All other systems reviewed and are negative.     Allergies  Review of patient's allergies indicates no known allergies.  Home Medications   Current Outpatient  Rx  Name  Route  Sig  Dispense  Refill  . aspirin EC 81 MG tablet   Oral   Take 81 mg by mouth daily.           [name] atorvastatin (LIPITOR) 40 MG tablet   Oral   Take 1 tablet (40 mg total) by mouth daily.   90 tablet   3   . celecoxib (CELEBREX) 100 MG capsule   Oral   Take 100 mg by mouth 2 (two) times daily.         . clopidogrel (PLAVIX) 75 MG tablet   Oral   Take 1 tablet (75 mg total) by mouth daily.   90 tablet   3   . diazepam (VALIUM) 5 MG tablet   Oral   Take 1 tablet (5 mg total) by mouth 2 (two) times daily.   10 tablet   0   . fish oil-omega-3 fatty acids 1000 MG capsule   Oral   Take 1 capsule (1 g total) by mouth daily.   60 capsule   1   . gabapentin (NEURONTIN) 100 MG capsule   Oral   Take 100 mg by mouth 3 (three) times daily. 200 mg in the morning 100 mg at noon 200 mg at bedtime         . lisinopril (PRINIVIL,ZESTRIL) 2.5 MG tablet    Oral   Take 1 tablet (2.5 mg total) by mouth daily.   90 tablet   3   . meclizine (ANTIVERT) 12.5 MG tablet   Oral   Take 1 tablet (12.5 mg total) by mouth 3 (three) times daily as needed for dizziness (also available over the counter).   30 tablet   3   . meloxicam (MOBIC) 15 MG tablet   Oral   Take 1 tablet (15 mg total) by mouth daily.   30 tablet   1   . metoprolol tartrate (LOPRESSOR) 25 MG tablet   Oral   Take 25 mg by mouth 2 (two) times daily.         . Multiple Vitamin (MULTIVITAMIN) tablet   Oral   Take 1 tablet by mouth daily.   30 tablet   10   . nitroGLYCERIN (NITROSTAT) 0.4 MG SL tablet   Sublingual   Place 1 tablet (0.4 mg total) under the tongue every 5 (five) minutes as needed for chest pain.   15 tablet   0   . oxyCODONE-acetaminophen (PERCOCET/ROXICET) 5-325 MG per tablet   Oral   Take 1-2 tablets by mouth every 6 (six) hours as needed for severe pain.   20 tablet   0   . promethazine (PHENERGAN) 25 MG tablet   Oral   Take 1 tablet (25 mg total) by mouth every 6 (six) hours as needed for nausea or vomiting.   12 tablet   0   . tadalafil (CIALIS) 20 MG tablet   Oral   Take 0.5-1 tablets (10-20 mg total) by mouth every other day as needed for erectile dysfunction.   5 tablet   11   . tapentadol (NUCYNTA) 50 MG TABS tablet   Oral   Take 50 mg by mouth every 4 (four) hours as needed for severe pain.         . traMADol (ULTRAM) 50 MG tablet   Oral   Take 2 tablets (100 mg total) by mouth every 8 (eight) hours as needed.   30 tablet   1  Triage Vitals: BP 125/87  Pulse 72  Temp(Src) 98.2 F (36.8 C) (Oral)  Resp 16  SpO2 95%  Physical Exam  Nursing note and vitals reviewed. Constitutional: He is oriented to person, place, and time. He appears well-developed and well-nourished. No distress.  HENT:  Head: Normocephalic and atraumatic.  Eyes: EOM are normal.  Neck: Normal range of motion. Neck supple.  Cardiovascular: Normal  rate.   Pulmonary/Chest: Effort normal.  Musculoskeletal: Normal range of motion.       Back:  Neurological: He is alert and oriented to person, place, and time. No sensory deficit. He exhibits normal muscle tone. Gait normal.  Reflex Scores:      Patellar reflexes are 1+ on the right side and 1+ on the left side.      Achilles reflexes are 2+ on the right side and 2+ on the left side. No midline C-spine, T-spine, or L-spine tenderness with no step-offs, crepitus, or deformities noted.Good sensation of lower extremities, equal bilaterally.  Good and equal strength, equal bilateral extremities. Normal gait.  Skin: Skin is warm and dry. He is not diaphoretic.  Psychiatric: He has a normal mood and affect. His behavior is normal.    ED Course  Procedures (including critical care time)  DIAGNOSTIC STUDIES: Oxygen Saturation is 95% on room air.  COORDINATION OF CARE: 2:13 PM- Discussed a clinical suspicion of sciatic pain and referring that the patient to see a back specialist for his symptoms.  The patient agreed to the treatment plan.      Labs Review Labs Reviewed - No data to display Imaging Review No results found.   EKG Interpretation None      MDM   Final diagnoses:  Chronic back pain  Encounter for medication counseling   On 02/15/14 he received 30 tablets of Ultram with a refill and 15 mg of a Mobic with a refill.  He received 10 tablets of 5 mg of Valium, a Dilaudid 2mg  injection, and 20 tablets of Percocet on 02/19/2014.   Patient with back pain.  No neurological deficits and normal neuro exam.  No loss of bowel or bladder control.  No concern for cauda equina.  No fever, night sweats, weight loss, h/o cancer, IVDU.  RICE protocol and pain medicine indicated and discussed with patient. The patient is requesting a Dilaudid shot in the ED.  Discussed need to see a specialist or a back specialist for chronic pain and that the ED is not for chronic pain use and narcotic  medication. Medication list shows tapentadol, percocet, gabapentin, valium, celebrex discussed he is already taking all the medication for back pain and some of his medication has the same MOI, states prescribed by his PCP, and he needs to schedule a medication check with his PCP. Discussed lab results, imaging results, and treatment plan with the patient. Return precautions given. Reports understanding and no other concerns at this time.  Patient is stable for discharge at this time.   I personally performed the services described in this documentation, which was scribed in my presence. The recorded information has been reviewed and is accurate.     Lorrine Kin, PA-C 02/26/14 1316

## 2014-02-24 NOTE — Discharge Instructions (Signed)
Call for a follow up appointment with a Family or Primary Care Provider.  You are taking multiple medications that can cause gastric ulcers and renal failure, you need to have a medication check with Dr. Doreene Burke.   You also need to call a Back specialist or a Pain specialist for your chronic back pain. Return if Symptoms worsen.   Take medication as prescribed.

## 2014-02-27 NOTE — ED Provider Notes (Signed)
Medical screening examination/treatment/procedure(s) were performed by non-physician practitioner and as supervising physician I was immediately available for consultation/collaboration.   EKG Interpretation None        [place], DO 02/27/14 [phone]

## 2014-03-01 ENCOUNTER — Ambulatory Visit: Payer: Medicare Other | Attending: Internal Medicine

## 2014-03-02 ENCOUNTER — Other Ambulatory Visit: Payer: Self-pay | Admitting: Physician Assistant

## 2014-03-19 ENCOUNTER — Other Ambulatory Visit (HOSPITAL_COMMUNITY): Payer: Self-pay | Admitting: Cardiology

## 2014-03-19 ENCOUNTER — Ambulatory Visit (HOSPITAL_COMMUNITY): Payer: Medicare HMO | Attending: Cardiovascular Disease | Admitting: Cardiology

## 2014-03-19 DIAGNOSIS — Z87891 Personal history of nicotine dependence: Secondary | ICD-10-CM | POA: Insufficient documentation

## 2014-03-19 DIAGNOSIS — E7800 Pure hypercholesterolemia, unspecified: Secondary | ICD-10-CM

## 2014-03-19 DIAGNOSIS — E785 Hyperlipidemia, unspecified: Secondary | ICD-10-CM | POA: Insufficient documentation

## 2014-03-19 DIAGNOSIS — Z9889 Other specified postprocedural states: Secondary | ICD-10-CM | POA: Insufficient documentation

## 2014-03-19 DIAGNOSIS — Z959 Presence of cardiac and vascular implant and graft, unspecified: Secondary | ICD-10-CM

## 2014-03-19 DIAGNOSIS — I739 Peripheral vascular disease, unspecified: Secondary | ICD-10-CM

## 2014-03-19 DIAGNOSIS — R209 Unspecified disturbances of skin sensation: Secondary | ICD-10-CM | POA: Insufficient documentation

## 2014-03-19 DIAGNOSIS — Z951 Presence of aortocoronary bypass graft: Secondary | ICD-10-CM | POA: Insufficient documentation

## 2014-03-19 DIAGNOSIS — I10 Essential (primary) hypertension: Secondary | ICD-10-CM | POA: Insufficient documentation

## 2014-03-19 DIAGNOSIS — I2510 Atherosclerotic heart disease of native coronary artery without angina pectoris: Secondary | ICD-10-CM | POA: Insufficient documentation

## 2014-03-19 NOTE — Progress Notes (Signed)
ABI and lower arterial duplex bilateral completed

## 2014-03-26 ENCOUNTER — Telehealth: Payer: Self-pay | Admitting: Cardiology

## 2014-03-26 NOTE — Telephone Encounter (Signed)
New message    Patient getting an injection in back .    When should he stop plavix.     [facility]  [phone].

## 2014-03-26 NOTE — Telephone Encounter (Signed)
Pt can hold plavix at discretion of physician performing injection Steven Hale

## 2014-03-27 NOTE — Telephone Encounter (Signed)
Spoke with pt, he is aware he is clear to hold his plavix. Will need to talk with [place] neuro once they open at 9 am.

## 2014-03-27 NOTE — Telephone Encounter (Signed)
Left message at [facility] neuro, pt is cleared to hold plavix prior to injection. They are to call back with questions.

## 2014-03-29 ENCOUNTER — Emergency Department (HOSPITAL_COMMUNITY)
Admission: EM | Admit: 2014-03-29 | Discharge: 2014-03-29 | Disposition: A | Discharge status: Home / Self Care | Payer: Medicare HMO | Attending: Emergency Medicine | Admitting: Emergency Medicine

## 2014-03-29 ENCOUNTER — Encounter (HOSPITAL_COMMUNITY): Payer: Self-pay | Admitting: Emergency Medicine

## 2014-03-29 DIAGNOSIS — E785 Hyperlipidemia, unspecified: Secondary | ICD-10-CM | POA: Insufficient documentation

## 2014-03-29 DIAGNOSIS — Z951 Presence of aortocoronary bypass graft: Secondary | ICD-10-CM | POA: Insufficient documentation

## 2014-03-29 DIAGNOSIS — Z9889 Other specified postprocedural states: Secondary | ICD-10-CM | POA: Insufficient documentation

## 2014-03-29 DIAGNOSIS — G8929 Other chronic pain: Secondary | ICD-10-CM | POA: Insufficient documentation

## 2014-03-29 DIAGNOSIS — I739 Peripheral vascular disease, unspecified: Secondary | ICD-10-CM | POA: Insufficient documentation

## 2014-03-29 DIAGNOSIS — R52 Pain, unspecified: Secondary | ICD-10-CM | POA: Insufficient documentation

## 2014-03-29 DIAGNOSIS — Z7902 Long term (current) use of antithrombotics/antiplatelets: Secondary | ICD-10-CM | POA: Insufficient documentation

## 2014-03-29 DIAGNOSIS — M1990 Unspecified osteoarthritis, unspecified site: Secondary | ICD-10-CM | POA: Insufficient documentation

## 2014-03-29 DIAGNOSIS — Z9861 Coronary angioplasty status: Secondary | ICD-10-CM | POA: Insufficient documentation

## 2014-03-29 DIAGNOSIS — I10 Essential (primary) hypertension: Secondary | ICD-10-CM | POA: Insufficient documentation

## 2014-03-29 DIAGNOSIS — Z8619 Personal history of other infectious and parasitic diseases: Secondary | ICD-10-CM | POA: Insufficient documentation

## 2014-03-29 DIAGNOSIS — Z862 Personal history of diseases of the blood and blood-forming organs and certain disorders involving the immune mechanism: Secondary | ICD-10-CM | POA: Insufficient documentation

## 2014-03-29 DIAGNOSIS — K219 Gastro-esophageal reflux disease without esophagitis: Secondary | ICD-10-CM | POA: Insufficient documentation

## 2014-03-29 DIAGNOSIS — Z87891 Personal history of nicotine dependence: Secondary | ICD-10-CM | POA: Insufficient documentation

## 2014-03-29 DIAGNOSIS — Z8669 Personal history of other diseases of the nervous system and sense organs: Secondary | ICD-10-CM | POA: Insufficient documentation

## 2014-03-29 DIAGNOSIS — Z79899 Other long term (current) drug therapy: Secondary | ICD-10-CM | POA: Insufficient documentation

## 2014-03-29 DIAGNOSIS — Z87448 Personal history of other diseases of urinary system: Secondary | ICD-10-CM | POA: Insufficient documentation

## 2014-03-29 DIAGNOSIS — I2510 Atherosclerotic heart disease of native coronary artery without angina pectoris: Secondary | ICD-10-CM | POA: Insufficient documentation

## 2014-03-29 DIAGNOSIS — Z791 Long term (current) use of non-steroidal anti-inflammatories (NSAID): Secondary | ICD-10-CM | POA: Insufficient documentation

## 2014-03-29 DIAGNOSIS — Z7982 Long term (current) use of aspirin: Secondary | ICD-10-CM | POA: Insufficient documentation

## 2014-03-29 DIAGNOSIS — M545 Low back pain, unspecified: Secondary | ICD-10-CM | POA: Insufficient documentation

## 2014-03-29 DIAGNOSIS — Z8509 Personal history of malignant neoplasm of other digestive organs: Secondary | ICD-10-CM | POA: Insufficient documentation

## 2014-03-29 MED ORDER — MORPHINE SULFATE 4 MG/ML IJ SOLN
6.0000 mg | Freq: Once | INTRAMUSCULAR | Status: AC
  Administered 2014-03-29: 6 mg via INTRAMUSCULAR
  Filled 2014-03-29: qty 2

## 2014-03-29 MED ORDER — ORPHENADRINE CITRATE 30 MG/ML IJ SOLN
60.0000 mg | Freq: Two times a day (BID) | INTRAMUSCULAR | Status: DC
  Administered 2014-03-29: 60 mg via INTRAMUSCULAR
  Filled 2014-03-29 (×2): qty 2

## 2014-03-29 MED ORDER — DIAZEPAM 5 MG PO TABS: 5.0000 mg | ORAL_TABLET | Freq: Two times a day (BID) | ORAL | Status: DC

## 2014-03-29 NOTE — ED Notes (Signed)
Bed: LU94 Expected date:  Expected time:  Means of arrival:  Comments: EMS-chronic back pain

## 2014-03-29 NOTE — ED Notes (Signed)
Per EMS patient c/o lower back pain radiating towards right leg, had MRI done a few weeks ago, one MD recommended surgery, a different MD recommended only pain management. Pt states pain has worsened recently, worse today, unable to control with pain medicine. Pt states he called staff at the pain clinic, who told him they could not do anything for him at this time.

## 2014-03-29 NOTE — ED Notes (Signed)
Patient was educated not to drive, operate heavy machinery, or drink alcohol while taking narcotic medication.  
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
## 2014-03-29 NOTE — Discharge Instructions (Signed)
Back Exercises °Back exercises help treat and prevent back injuries. The goal of back exercises is to increase the strength of your abdominal and back muscles and the flexibility of your back. These exercises should be started when you no longer have back pain. Back exercises include: °· Pelvic Tilt. Lie on your back with your knees bent. Tilt your pelvis until the lower part of your back is against the floor. Hold this position 5 to 10 sec and repeat 5 to 10 times. °· Knee to Chest. Pull first 1 knee up against your chest and hold for 20 to 30 seconds, repeat this with the other knee, and then both knees. This may be done with the other leg straight or bent, whichever feels better. °· Sit-Ups or Curl-Ups. Bend your knees 90 degrees. Start with tilting your pelvis, and do a partial, slow sit-up, lifting your trunk only 30 to 45 degrees off the floor. Take at least 2 to 3 seconds for each sit-up. Do not do sit-ups with your knees out straight. If partial sit-ups are difficult, simply do the above but with only tightening your abdominal muscles and holding it as directed. °· Hip-Lift. Lie on your back with your knees flexed 90 degrees. Push down with your feet and shoulders as you raise your hips a couple inches off the floor; hold for 10 seconds, repeat 5 to 10 times. °· Back arches. Lie on your stomach, propping yourself up on bent elbows. Slowly press on your hands, causing an arch in your low back. Repeat 3 to 5 times. Any initial stiffness and discomfort should lessen with repetition over time. °· Shoulder-Lifts. Lie face down with arms beside your body. Keep hips and torso pressed to floor as you slowly lift your head and shoulders off the floor. °Do not overdo your exercises, especially in the beginning. Exercises may cause you some mild back discomfort which lasts for a few minutes; however, if the pain is more severe, or lasts for more than 15 minutes, do not continue exercises until you see your caregiver.  Improvement with exercise therapy for back problems is slow.  °See your caregivers for assistance with developing a proper back exercise program. °Document Released: 12/10/2004 Document Revised: 01/25/2012 Document Reviewed: 09/03/2011 °ExitCare® Patient Information ©2014 [employer]. ° °Back Pain, Adult °Low back pain is very common. About 1 in 5 people have back pain. The cause of low back pain is rarely dangerous. The pain often gets better over time. About half of people with a sudden onset of back pain feel better in just 2 weeks. About 8 in 10 people feel better by 6 weeks.  °CAUSES °Some common causes of back pain include: °· Strain of the muscles or ligaments supporting the spine. °· Wear and tear (degeneration) of the spinal discs. °· Arthritis. °· Direct injury to the back. °DIAGNOSIS °Most of the time, the direct cause of low back pain is not known. However, back pain can be treated effectively even when the exact cause of the pain is unknown. Answering your caregiver's questions about your overall health and symptoms is one of the most accurate ways to make sure the cause of your pain is not dangerous. If your caregiver needs more information, he or she may order lab work or imaging tests (X-rays or MRIs). However, even if imaging tests show changes in your back, this usually does not require surgery. °HOME CARE INSTRUCTIONS °For many people, back pain returns. Since low back pain is rarely dangerous, it is often a condition that people   can learn to manage on their own.  °· Remain active. It is stressful on the back to sit or stand in one place. Do not sit, drive, or stand in one place for more than 30 minutes at a time. Take short walks on level surfaces as soon as pain allows. Try to increase the length of time you walk each day. °· Do not stay in bed. Resting more than 1 or 2 days can delay your recovery. °· Do not avoid exercise or work. Your body is made to move. It is not dangerous to be active,  even though your back may hurt. Your back will likely heal faster if you return to being active before your pain is gone. °· Pay attention to your body when you  bend and lift. Many people have less discomfort when lifting if they bend their knees, keep the load close to their bodies, and avoid twisting. Often, the most comfortable positions are those that put less stress on your recovering back. °· Find a comfortable position to sleep. Use a firm mattress and lie on your side with your knees slightly bent. If you lie on your back, put a pillow under your knees. °· Only take over-the-counter or prescription medicines as directed by your caregiver. Over-the-counter medicines to reduce pain and inflammation are often the most helpful. Your caregiver may prescribe muscle relaxant drugs. These medicines help dull your pain so you can more quickly return to your normal activities and healthy exercise. °· Put ice on the injured area. °· Put ice in a plastic bag. °· Place a towel between your skin and the bag. °· Leave the ice on for 15-20 minutes, 03-04 times a day for the first 2 to 3 days. After that, ice and heat may be alternated to reduce pain and spasms. °· Ask your caregiver about trying back exercises and gentle massage. This may be of some benefit. °· Avoid feeling anxious or stressed. Stress increases muscle tension and can worsen back pain. It is important to recognize when you are anxious or stressed and learn ways to manage it. Exercise is a great option. °SEEK MEDICAL CARE IF: °· You have pain that is not relieved with rest or medicine. °· You have pain that does not improve in 1 week. °· You have new symptoms. °· You are generally not feeling well. °SEEK IMMEDIATE MEDICAL CARE IF:  °· You have pain that radiates from your back into your legs. °· You develop new bowel or bladder control problems. °· You have unusual weakness or numbness in your arms or legs. °· You develop nausea or vomiting. °· You develop  abdominal pain. °· You feel faint. °Document Released: 11/02/2005 Document Revised: 05/03/2012 Document Reviewed: 03/23/2011 °ExitCare® Patient Information ©2014 [employer]. ° °

## 2014-03-29 NOTE — ED Notes (Signed)
Initial contact - pt sitting up in chair at bedside.  C/O 8/10 pain, pt sts "i can walk now, but pain is still bad".  Ambulatory in room.  Med per [facility].  Denies other needs/complaints at this time.  Call bell in reach.  NAD.

## 2014-04-02 ENCOUNTER — Ambulatory Visit: Payer: Self-pay | Admitting: Internal Medicine

## 2014-04-17 ENCOUNTER — Encounter: Payer: Self-pay | Admitting: Cardiovascular Disease

## 2014-04-17 ENCOUNTER — Ambulatory Visit (INDEPENDENT_AMBULATORY_CARE_PROVIDER_SITE_OTHER): Payer: Medicare HMO | Admitting: Cardiovascular Disease

## 2014-04-17 VITALS — BP 110/82 | HR 61 | Ht 70.0 in | Wt 216.0 lb

## 2014-04-17 DIAGNOSIS — I2510 Atherosclerotic heart disease of native coronary artery without angina pectoris: Secondary | ICD-10-CM

## 2014-04-17 DIAGNOSIS — E785 Hyperlipidemia, unspecified: Secondary | ICD-10-CM

## 2014-04-17 DIAGNOSIS — I739 Peripheral vascular disease, unspecified: Secondary | ICD-10-CM

## 2014-04-17 DIAGNOSIS — I7389 Other specified peripheral vascular diseases: Secondary | ICD-10-CM

## 2014-04-17 NOTE — Assessment & Plan Note (Signed)
He has no symptoms of angina. Continue medical therapy.

## 2014-04-17 NOTE — Assessment & Plan Note (Signed)
Lab Results  Component Value Date   CHOL 155 11/23/2013   HDL 42.80 11/23/2013   LDLCALC 89 11/23/2013   LDLDIRECT 80 11/06/2013   TRIG 118.0 11/23/2013   CHOLHDL 4 11/23/2013   Continue treatment with a statin.

## 2014-04-17 NOTE — Assessment & Plan Note (Signed)
Arterial duplex showed evidence of focal left SFA instent restenosis. However, he is not having claudication. I recommend continued observation and repeat lower extremity arterial duplex in 6 months from now.

## 2014-04-17 NOTE — Patient Instructions (Signed)
Your physician has requested that you have a lower extremity arterial duplex in 6 MONTHS. This test is an ultrasound of the arteries in the legs. It looks at arterial blood flow in the legs. Allow one hour for Lower Arterial scans. There are no restrictions or special instructions  Your physician wants you to follow-up in: 6 MONTHS with Dr Fletcher Anon. You will receive a reminder letter in the mail two months in advance. If you don't receive a letter, please call our office to schedule the follow-up appointment.  Your physician recommends that you continue on your current medications as directed. Please refer to the Current Medication list given to you today.

## 2014-04-17 NOTE — Progress Notes (Signed)
HPI: This is a 58 year old male who is here today for a followup visit regarding  peripheral arterial disease. He has known history of coronary artery disease with multiple interventions in the past. Cardiac catheterization in November of 2011 showed severe three-vessel coronary disease and he underwent coronary artery bypass graft surgery. He also had SFA stents placed bilaterally years ago. He quit smoking in 2009.  He was seen in 08/2013 for significant worsening of Claudication and cramping of left calf.  I proceeded with angiography which showed an occluded distal left SFA starting above the previously placed stent. I performed directional atherectomy and balloon angioplasty which established normal flow.Post procedure ABI was normal.  He has been doing reasonably well. He denies calf claudication. He has episodes of generalized pain which makes him paralyzed according to him when that happened. Recent lower extremity arterial duplex showed focal in-stent restenosis in the left SFA with a velocity of around 380.  No Known Allergies   Current Outpatient Prescriptions on File Prior to Visit  Medication Sig Dispense Refill  . aspirin EC 81 MG tablet Take 81 mg by mouth daily.        [name] atorvastatin (LIPITOR) 40 MG tablet Take 1 tablet (40 mg total) by mouth daily.  90 tablet  3  . celecoxib (CELEBREX) 100 MG capsule Take 100 mg by mouth 2 (two) times daily.      . clopidogrel (PLAVIX) 75 MG tablet Take 1 tablet (75 mg total) by mouth daily.  90 tablet  3  . diazepam (VALIUM) 5 MG tablet Take 1 tablet (5 mg total) by mouth 2 (two) times daily.  10 tablet  0  . fish oil-omega-3 fatty acids 1000 MG capsule Take 1 capsule (1 g total) by mouth daily.  60 capsule  1  . gabapentin (NEURONTIN) 300 MG capsule Take 300 mg by mouth 3 (three) times daily.      [name] lisinopril (PRINIVIL,ZESTRIL) 5 MG tablet Take 2.5 mg by mouth daily. Takes half tablet      . meclizine (ANTIVERT) 12.5 MG tablet Take 1 tablet  (12.5 mg total) by mouth 3 (three) times daily as needed for dizziness (also available over the counter).  30 tablet  3  . metoprolol tartrate (LOPRESSOR) 25 MG tablet Take 25 mg by mouth 2 (two) times daily.      . Multiple Vitamin (MULTIVITAMIN) tablet Take 1 tablet by mouth daily.  30 tablet  10  . nitroGLYCERIN (NITROSTAT) 0.4 MG SL tablet Place 1 tablet (0.4 mg total) under the tongue every 5 (five) minutes as needed for chest pain.  15 tablet  0  . oxyCODONE-acetaminophen (PERCOCET/ROXICET) 5-325 MG per tablet Take 1-2 tablets by mouth every 6 (six) hours as needed for severe pain.  20 tablet  0  . tadalafil (CIALIS) 20 MG tablet Take 0.5-1 tablets (10-20 mg total) by mouth every other day as needed for erectile dysfunction.  5 tablet  11  . tapentadol (NUCYNTA) 50 MG TABS tablet Take 50 mg by mouth every 4 (four) hours as needed for severe pain.       Current Facility-Administered Medications on File Prior to Visit  Medication Dose Route Frequency Provider Last Rate Last Dose  . testosterone cypionate (DEPOTESTOTERONE CYPIONATE) injection 200 mg  200 mg Intramuscular Once Angelica Chessman, MD         Past Medical History  Diagnosis Date  . PAD (peripheral artery disease)     a. s/p bilat SFA stents;  b. ABIs 11/2012: R 0.99, L 0.86.  [name] Hypertension   . Hyperlipidemia   . Anemia, unspecified   . GERD (gastroesophageal reflux disease)   . Helicobacter pylori (H. pylori) infection   . Hemorrhoids   . Impotence of organic origin   . Bell's palsy   . CAD (coronary artery disease)     a. s/p multiple PCIs;  b. s/p CABG in 09/2010 (LIMA-LAD, SVG-OM1, SVG-distal RCA/OM2);  c.  Myoview 05/2012: EF 54%, no ischemia, no scar.   . Blood transfusion     during treatment for Ca  . DJD (degenerative joint disease)     low back & all over   . Pancreatic cancer     surgery 2009     Past Surgical History  Procedure Laterality Date  . Pancreatic  cancer  05/10/2008    Resection of distal  panrease and spleen  . Splenectomy    . Coronary artery bypass graft  nov 2011  . Back surgery      1992  . Cardiac catheterization    . Hemorrhoid surgery  10/30/2011    Procedure: HEMORRHOIDECTOMY;  Surgeon: Harl Bowie, MD;  Location: [facility] OR;  Service: General;  Laterality: N/A;     Family History  Problem Relation Age of Onset  . Heart attack Father     died of MI at age 21  . Cancer Father     pt unaware of what kind  . Anesthesia problems Neg Hx   . Hypotension Neg Hx   . Malignant hyperthermia Neg Hx   . Pseudochol deficiency Neg Hx      History   Social History  . Marital Status: Single    Spouse Name: N/A    Number of Children: 0  . Years of Education: N/A   Occupational History  .     Social History Main Topics  . Smoking status: Former Smoker    Types: Cigarettes    Quit date: 11/29/2008  . Smokeless tobacco: Never Used  . Alcohol Use: 1.2 oz/week    2 Glasses of wine per week     Comment: everyday  . Drug Use: No  . Sexual Activity: Not Currently   Other Topics Concern  . 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 He appears well-developed and well-nourished. No distress.  HENT: No nasal discharge.  Head: Normocephalic and atraumatic.  Eyes: Pupils are equal and round. Right eye exhibits no discharge. Left eye exhibits no discharge.  Neck: Normal range of motion. Neck supple. No JVD present. No thyromegaly present.  Cardiovascular: Normal rate, regular rhythm, normal heart sounds and. Exam reveals no gallop and no friction rub. No murmur heard.  Pulmonary/Chest: Effort normal and breath sounds normal. No stridor. No respiratory distress. He has no wheezes. He has no rales. He  exhibits no tenderness.  Abdominal: Soft. Bowel sounds are normal. He exhibits no distension. There is no tenderness. There is no rebound and no guarding.  Musculoskeletal: Normal range of motion. He exhibits no edema and no tenderness.  Neurological: He is alert and oriented to person, place, and time. Coordination normal.  Skin: Skin is warm and dry. No rash noted. He is not diaphoretic. No erythema. No pallor.  Psychiatric: He has a normal mood and affect. His behavior is normal. Judgment and thought content normal.  Vascular: Femoral pulses are slightly diminished bilaterally. PT: +1 in the right side and 0 on the left side, DP: + 1 On the right side and +1 on the left side.    EKG: Normal sinus rhythm with nonspecific T wave changes.   ASSESSMENT AND PLAN

## 2014-05-16 ENCOUNTER — Other Ambulatory Visit: Payer: Self-pay

## 2014-05-16 DIAGNOSIS — E7800 Pure hypercholesterolemia, unspecified: Secondary | ICD-10-CM

## 2014-05-16 DIAGNOSIS — I739 Peripheral vascular disease, unspecified: Secondary | ICD-10-CM

## 2014-05-16 MED ORDER — LISINOPRIL 5 MG PO TABS: 2.5000 mg | ORAL_TABLET | Freq: Every day | ORAL | Status: DC

## 2014-05-16 MED ORDER — CLOPIDOGREL BISULFATE 75 MG PO TABS: 75.0000 mg | ORAL_TABLET | Freq: Every day | ORAL | Status: DC

## 2014-05-16 MED ORDER — ATORVASTATIN CALCIUM 40 MG PO TABS: 40.0000 mg | ORAL_TABLET | Freq: Every day | ORAL | Status: DC

## 2014-05-18 ENCOUNTER — Other Ambulatory Visit: Payer: Self-pay | Admitting: Internal Medicine

## 2014-05-18 NOTE — Progress Notes (Signed)
Subjective:     Patient ID: Steven Hale, male   DOB: 1956-06-20, 59 y.o.   MRN: [id number]  HPI Brayden Brodhead is a 58 y.o. male here today for testosterone injection, he was recently diagnosed with low testosterone level and erectile dysfunction. He could not afford Viagra. He has no complaint today. Patient has No headache, No chest pain, No abdominal pain - No Nausea, No new weakness tingling or numbness, No Cough - SOB. Review of Systems Negative except as detailed above  Objective:  Physical Exam Vitals as recorded Assessment:  Hypogonadism with Low Testosterone    Plan:   Injection Testosterone 200 mg intramuscular  Follow up in 1 month   Angelica Chessman, MD  01/01/2014

## 2014-05-31 ENCOUNTER — Encounter: Payer: Self-pay | Admitting: Cardiology

## 2014-05-31 ENCOUNTER — Ambulatory Visit (INDEPENDENT_AMBULATORY_CARE_PROVIDER_SITE_OTHER): Payer: Medicare HMO | Admitting: Cardiology

## 2014-05-31 ENCOUNTER — Other Ambulatory Visit: Payer: Self-pay | Admitting: *Deleted

## 2014-05-31 VITALS — BP 115/69 | HR 56 | Ht 70.0 in | Wt 221.0 lb

## 2014-05-31 DIAGNOSIS — R748 Abnormal levels of other serum enzymes: Secondary | ICD-10-CM

## 2014-05-31 DIAGNOSIS — E7800 Pure hypercholesterolemia, unspecified: Secondary | ICD-10-CM

## 2014-05-31 DIAGNOSIS — I739 Peripheral vascular disease, unspecified: Secondary | ICD-10-CM

## 2014-05-31 DIAGNOSIS — I2510 Atherosclerotic heart disease of native coronary artery without angina pectoris: Secondary | ICD-10-CM

## 2014-05-31 DIAGNOSIS — I10 Essential (primary) hypertension: Secondary | ICD-10-CM

## 2014-05-31 LAB — HEPATIC FUNCTION PANEL
ALK PHOS: 62 U/L (ref 39–117)
ALT: 37 U/L (ref 0–53)
AST: 30 U/L (ref 0–37)
Albumin: 4.7 g/dL (ref 3.5–5.2)
BILIRUBIN DIRECT: 0.1 mg/dL (ref 0.0–0.3)
BILIRUBIN INDIRECT: 0.6 mg/dL (ref 0.2–1.2)
BILIRUBIN TOTAL: 0.7 mg/dL (ref 0.2–1.2)
TOTAL PROTEIN: 7.3 g/dL (ref 6.0–8.3)

## 2014-05-31 LAB — CBC
HCT: 35.5 % — ABNORMAL LOW (ref 39.0–52.0)
HEMOGLOBIN: 12.1 g/dL — AB (ref 13.0–17.0)
MCH: 30.6 pg (ref 26.0–34.0)
MCHC: 34.1 g/dL (ref 30.0–36.0)
MCV: 89.9 fL (ref 78.0–100.0)
Platelets: 399 10*3/uL (ref 150–400)
RBC: 3.95 MIL/uL — AB (ref 4.22–5.81)
RDW: 15.3 % (ref 11.5–15.5)
WBC: 4.6 10*3/uL (ref 4.0–10.5)

## 2014-05-31 LAB — LIPID PANEL
Cholesterol: 168 mg/dL (ref 0–200)
HDL: 51 mg/dL (ref >39)
LDL CALC: 85 mg/dL (ref 0–99)
TRIGLYCERIDES: 158 mg/dL — AB (ref <150)
Total CHOL/HDL Ratio: 3.3 Ratio
VLDL: 32 mg/dL (ref 0–40)

## 2014-05-31 LAB — BASIC METABOLIC PANEL WITH GFR
BUN: 10 mg/dL (ref 6–23)
CHLORIDE: 102 meq/L (ref 96–112)
CO2: 27 meq/L (ref 19–32)
Calcium: 10.1 mg/dL (ref 8.4–10.5)
Creat: 1.07 mg/dL (ref 0.50–1.35)
GFR, Est African American: 88 mL/min
GFR, Est Non African American: 76 mL/min
Glucose, Bld: 103 mg/dL — ABNORMAL HIGH (ref 70–99)
POTASSIUM: 4.6 meq/L (ref 3.5–5.3)
Sodium: 136 mEq/L (ref 135–145)

## 2014-05-31 LAB — CK: Total CK: 434 U/L — ABNORMAL HIGH (ref 7–232)

## 2014-05-31 LAB — TSH: TSH: 2.26 u[IU]/mL (ref 0.350–4.500)

## 2014-05-31 MED ORDER — ATORVASTATIN CALCIUM 40 MG PO TABS: 20.0000 mg | ORAL_TABLET | Freq: Every day | ORAL | Status: DC

## 2014-05-31 NOTE — Telephone Encounter (Signed)
Please call,he said he wanted his blood tested for everything.The lab technician told him to have you call and place an order and they could use his blood they already had.

## 2014-05-31 NOTE — Assessment & Plan Note (Signed)
Blood pressure controlled. Continue present medications. Check potassium and renal function. I will also check a CK. He is having some fatigue and weight gain. Check TSH and hemoglobin.

## 2014-05-31 NOTE — Assessment & Plan Note (Signed)
Continue statin. Check lipids and liver. 

## 2014-05-31 NOTE — Patient Instructions (Signed)
Your physician wants you to follow-up in: ONE YEAR WITH DR CRENSHAW You will receive a reminder letter in the mail two months in advance. If you don't receive a letter, please call our office to schedule the follow-up appointment.   Your physician recommends that you HAVE LAB WORK TODAY 

## 2014-05-31 NOTE — Progress Notes (Signed)
HPI: FU CAD. He has a hx of multiple interventions in the past ultimately requiring CABG in 09/2010 (LIMA-LAD, SVG-OM1, SVG-distal RCA/OM2), 40% right renal artery stenosis by cardiac catheterization in 2011, PAD, s/p bilateral SFA stents in the past, HTN, HL, pancreatic cancer. Myoview 05/2012: EF 54%, no ischemia, no scar. Seen in October of 2014 with increased claudication. Had arteriogram which revealed an occluded distal left SFA. Had directional atherectomy and balloon angioplasty at that time. Followup ABIs in May of 2015 showed greater than 50% stenosis on the left. Followed by Dr. Fletcher Anon. Since last seen, he occasionally has dyspnea but denies chest pain. No syncope. He has some pain in his legs that is intermittent and not related to exertion.   Current Outpatient Prescriptions  Medication Sig Dispense Refill  . aspirin EC 81 MG tablet Take 81 mg by mouth daily.        [name] atorvastatin (LIPITOR) 40 MG tablet Take 1 tablet (40 mg total) by mouth daily.  90 tablet  1  . celecoxib (CELEBREX) 100 MG capsule Take 100 mg by mouth 2 (two) times daily.      . clopidogrel (PLAVIX) 75 MG tablet Take 1 tablet (75 mg total) by mouth daily.  90 tablet  1  . fish oil-omega-3 fatty acids 1000 MG capsule Take 1 capsule (1 g total) by mouth daily.  60 capsule  1  . gabapentin (NEURONTIN) 300 MG capsule Take 300 mg by mouth 3 (three) times daily.      [name] lisinopril (PRINIVIL,ZESTRIL) 5 MG tablet Take 0.5 tablets (2.5 mg total) by mouth daily. Takes half tablet  45 tablet  1  . meclizine (ANTIVERT) 12.5 MG tablet Take 1 tablet (12.5 mg total) by mouth 3 (three) times daily as needed for dizziness (also available over the counter).  30 tablet  3  . metoprolol tartrate (LOPRESSOR) 25 MG tablet Take 25 mg by mouth 2 (two) times daily.      . Multiple Vitamin (MULTIVITAMIN) tablet Take 1 tablet by mouth daily.  30 tablet  10  . nitroGLYCERIN (NITROSTAT) 0.4 MG SL tablet Place 1 tablet (0.4 mg total) under the  tongue every 5 (five) minutes as needed for chest pain.  15 tablet  0  . oxyCODONE-acetaminophen (PERCOCET/ROXICET) 5-325 MG per tablet Take 1-2 tablets by mouth every 6 (six) hours as needed for severe pain.  20 tablet  0  . tadalafil (CIALIS) 20 MG tablet Take 0.5-1 tablets (10-20 mg total) by mouth every other day as needed for erectile dysfunction.  5 tablet  11  . tapentadol (NUCYNTA) 50 MG TABS tablet Take 50 mg by mouth every 4 (four) hours as needed for severe pain.       Current Facility-Administered Medications  Medication Dose Route Frequency Provider Last Rate Last Dose  . testosterone cypionate (DEPOTESTOTERONE CYPIONATE) injection 200 mg  200 mg Intramuscular Once Angelica Chessman, MD         Past Medical History  Diagnosis Date  . PAD (peripheral artery disease)     a. s/p bilat SFA stents;  b. ABIs 11/2012: R 0.99, L 0.86.  [name] Hypertension   . Hyperlipidemia   . Anemia, unspecified   . GERD (gastroesophageal reflux disease)   . Helicobacter pylori (H. pylori) infection   . Hemorrhoids   . Impotence of organic origin   . Bell's palsy   . CAD (coronary artery disease)     a. s/p multiple PCIs;  b. s/p  CABG in 09/2010 (LIMA-LAD, SVG-OM1, SVG-distal RCA/OM2);  c.  Myoview 05/2012: EF 54%, no ischemia, no scar.   . Blood transfusion     during treatment for Ca  . DJD (degenerative joint disease)     low back & all over   . Pancreatic cancer     surgery 2009    Past Surgical History  Procedure Laterality Date  . Pancreatic  cancer  05/10/2008    Resection of distal panrease and spleen  . Splenectomy    . Coronary artery bypass graft  nov 2011  . Back surgery      1992  . Cardiac catheterization    . Hemorrhoid surgery  10/30/2011    Procedure: HEMORRHOIDECTOMY;  Surgeon: Harl Bowie, MD;  Location: [facility];  Service: General;  Laterality: N/A;    History   Social History  . Marital Status: Single    Spouse Name: N/A    Number of Children: 0  . Years of  Education: N/A   Occupational History  .     Social History Main Topics  . Smoking status: Former Smoker    Types: Cigarettes    Quit date: 11/29/2008  . Smokeless tobacco: Never Used  . Alcohol Use: 1.2 oz/week    2 Glasses of wine per week     Comment: everyday  . Drug Use: No  . Sexual Activity: Not Currently   Other Topics Concern  . 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 Remaining systems are negative.  Physical Exam: Well-developed well-nourished in no acute distress.  Skin is warm and dry.  HEENT is normal.  Neck is supple.  Chest is clear to auscultation with normal expansion.  Cardiovascular exam is regular rate and rhythm.  Abdominal exam nontender or distended. No masses palpated. Extremities show no edema. neuro grossly intact  ECG 04/17/2014-sinus rhythm with nonspecific ST changes.

## 2014-05-31 NOTE — Assessment & Plan Note (Signed)
Continue aspirin, Plavix and statin. Followed by Dr. Fletcher Anon

## 2014-05-31 NOTE — Assessment & Plan Note (Signed)
Continue aspirin and statin. 

## 2014-05-31 NOTE — Telephone Encounter (Signed)
This encounter was created in error - please disregard.

## 2014-06-11 ENCOUNTER — Ambulatory Visit: Payer: Medicare HMO | Attending: Internal Medicine | Admitting: Internal Medicine

## 2014-06-11 ENCOUNTER — Encounter: Payer: Self-pay | Admitting: Internal Medicine

## 2014-06-11 VITALS — BP 129/79 | HR 65 | Temp 98.4°F | Resp 16 | Ht 70.0 in | Wt 220.0 lb

## 2014-06-11 DIAGNOSIS — Z1211 Encounter for screening for malignant neoplasm of colon: Secondary | ICD-10-CM | POA: Insufficient documentation

## 2014-06-11 DIAGNOSIS — Z951 Presence of aortocoronary bypass graft: Secondary | ICD-10-CM | POA: Insufficient documentation

## 2014-06-11 DIAGNOSIS — I70209 Unspecified atherosclerosis of native arteries of extremities, unspecified extremity: Secondary | ICD-10-CM | POA: Insufficient documentation

## 2014-06-11 DIAGNOSIS — M1710 Unilateral primary osteoarthritis, unspecified knee: Secondary | ICD-10-CM

## 2014-06-11 DIAGNOSIS — E785 Hyperlipidemia, unspecified: Secondary | ICD-10-CM | POA: Insufficient documentation

## 2014-06-11 DIAGNOSIS — Z7982 Long term (current) use of aspirin: Secondary | ICD-10-CM | POA: Insufficient documentation

## 2014-06-11 DIAGNOSIS — N529 Male erectile dysfunction, unspecified: Secondary | ICD-10-CM | POA: Insufficient documentation

## 2014-06-11 DIAGNOSIS — I10 Essential (primary) hypertension: Secondary | ICD-10-CM | POA: Insufficient documentation

## 2014-06-11 DIAGNOSIS — D509 Iron deficiency anemia, unspecified: Secondary | ICD-10-CM | POA: Insufficient documentation

## 2014-06-11 DIAGNOSIS — Z87891 Personal history of nicotine dependence: Secondary | ICD-10-CM | POA: Diagnosis not present

## 2014-06-11 DIAGNOSIS — M1990 Unspecified osteoarthritis, unspecified site: Secondary | ICD-10-CM | POA: Insufficient documentation

## 2014-06-11 DIAGNOSIS — M1711 Unilateral primary osteoarthritis, right knee: Secondary | ICD-10-CM

## 2014-06-11 NOTE — Progress Notes (Signed)
Pt is here following up on his HTN. Pt is needing a referral to an orthopedic surgeon.

## 2014-06-11 NOTE — Progress Notes (Signed)
Patient ID: Steven Hale, male   DOB: [birth year], 58 y.o.   MRN: [id number]   Steven Hale, is a 58 y.o. male  LKG:[id number]  GUY:[id number]  DOB - [birth year]  Chief Complaint  Patient presents with  . Follow-up        Subjective:   Steven Hale is a 58 y.o. male here today for a follow up visit. Patient has extensive history of peripheral arterial disease, coronary artery disease with multiple interventions in the past, hypertension, dyslipidemia, anemia, and osteoarthritis. Cardiac catheterization in November of 2011 showed severe three-vessel coronary disease and he underwent coronary artery bypass graft surgery. He also had SFA stents placed bilaterally years ago. He quit smoking in 2009. He is following up today for a possible referral to orthopedic surgery for evaluation of his knee and possible knee replacement. He also needs something for pain. He was recently worked up by cardiologist and found to have anemia with hemoglobin of 12.3, he was instructed to follow with primary care physician for proper workup. Patient denied blood in stool, he has not had colonoscopy. He denies any bleeding from any orifice. Other than the right knee pain he has no other complaint today. Patient has No headache, No chest pain, No abdominal pain - No Nausea, No new weakness tingling or numbness, No Cough - SOB.  Problem  Colon Cancer Screening  Primary Osteoarthritis of Right Knee  Iron Deficiency Anemia    ALLERGIES: No Known Allergies  PAST MEDICAL HISTORY: Past Medical History  Diagnosis Date  . PAD (peripheral artery disease)     a. s/p bilat SFA stents;  b. ABIs 11/2012: R 0.99, L 0.86.  [name] Hypertension   . Hyperlipidemia   . Anemia, unspecified   . GERD (gastroesophageal reflux disease)   . Helicobacter pylori (H. pylori) infection   . Hemorrhoids   . Impotence of organic origin   . Bell's palsy   . CAD (coronary artery disease)     a. s/p multiple PCIs;  b. s/p CABG in 09/2010  (LIMA-LAD, SVG-OM1, SVG-distal RCA/OM2);  c.  Myoview 05/2012: EF 54%, no ischemia, no scar.   . Blood transfusion     during treatment for Ca  . DJD (degenerative joint disease)     low back & all over   . Pancreatic cancer     surgery 2009    MEDICATIONS AT HOME: Prior to Admission medications   Medication Sig Start Date End Date Taking? Authorizing Provider  aspirin EC 81 MG tablet Take 81 mg by mouth daily.     Yes Historical Provider, MD  atorvastatin (LIPITOR) 40 MG tablet Take 0.5 tablets (20 mg total) by mouth daily. 05/31/14  Yes Lelon Perla, MD  celecoxib (CELEBREX) 100 MG capsule Take 100 mg by mouth 2 (two) times daily.   Yes Historical Provider, MD  clopidogrel (PLAVIX) 75 MG tablet Take 1 tablet (75 mg total) by mouth daily. 05/16/14  Yes Wellington Hampshire, MD  fish oil-omega-3 fatty acids 1000 MG capsule Take 1 capsule (1 g total) by mouth daily. 11/30/12  Yes Rosana Hoes, MD  gabapentin (NEURONTIN) 300 MG capsule Take 300 mg by mouth 3 (three) times daily.   Yes Historical Provider, MD  lisinopril (PRINIVIL,ZESTRIL) 5 MG tablet Take 0.5 tablets (2.5 mg total) by mouth daily. Takes half tablet 05/16/14  Yes Lelon Perla, MD  meclizine (ANTIVERT) 12.5 MG tablet Take 1 tablet (12.5 mg total) by mouth 3 (three) times daily as needed for dizziness (  also available over the counter). 08/30/13  Yes Ripudeep Krystal Eaton, MD  metoprolol tartrate (LOPRESSOR) 25 MG tablet Take 25 mg by mouth 2 (two) times daily.   Yes Historical Provider, MD  Multiple Vitamin (MULTIVITAMIN) tablet Take 1 tablet by mouth daily. 11/30/12  Yes Rosana Hoes, MD  nitroGLYCERIN (NITROSTAT) 0.4 MG SL tablet Place 1 tablet (0.4 mg total) under the tongue every 5 (five) minutes as needed for chest pain. 11/30/12  Yes Rosana Hoes, MD  oxyCODONE-acetaminophen (PERCOCET/ROXICET) 5-325 MG per tablet Take 1-2 tablets by mouth every 6 (six) hours as needed for severe pain. 02/19/14  Yes Elwyn Lade, PA-C  tadalafil (CIALIS) 20  MG tablet Take 0.5-1 tablets (10-20 mg total) by mouth every other day as needed for erectile dysfunction. 09/13/13  Yes Ripudeep Krystal Eaton, MD  tapentadol (NUCYNTA) 50 MG TABS tablet Take 50 mg by mouth every 4 (four) hours as needed for severe pain.   Yes Historical Provider, MD     Objective:   Filed Vitals:   06/11/14 1535  BP: 129/79  Pulse: 65  Temp: 98.4 F (36.9 C)  TempSrc: Oral  Resp: 16  Height: 5\' 10"  (1.778 m)  Weight: 220 lb (99.791 kg)  SpO2: 96%    Exam General appearance : Awake, alert, not in any distress. Speech Clear. Not toxic looking HEENT: Atraumatic and Normocephalic, pupils equally reactive to light and accomodation Neck: supple, no JVD. No cervical lymphadenopathy.  Chest:Good air entry bilaterally, no added sounds  CVS: S1 S2 regular, no murmurs.  Abdomen: Bowel sounds present, Non tender and not distended with no gaurding, rigidity or rebound. Extremities: B/L Lower Ext shows no edema, both legs are warm to touch Neurology: Awake alert, and oriented X 3, CN II-XII intact, Non focal Skin:No Rash Wounds:N/A  Data Review Lab Results  Component Value Date   HGBA1C 5.8 08/30/2013   HGBA1C 6.0* 04/17/2013   HGBA1C  Value: 6.4 (NOTE)                                                                       According to the ADA Clinical Practice Recommendations for 2011, when HbA1c is used as a screening test:   >=6.5%   Diagnostic of Diabetes Mellitus           (if abnormal result  is confirmed)  5.7-6.4%   Increased risk of developing Diabetes Mellitus  References:Diagnosis and Classification of Diabetes Mellitus,Diabetes HWEX,9371,69(CVELF 1):S62-S69 and Standards of Medical Care in         Diabetes - 2011,Diabetes YBOF,7510,25  (Suppl 1):S11-S61.* 09/16/2010     Assessment & Plan   1. Colon cancer screening  - [facility] COLONOSCOPY - Ambulatory referral to Gastroenterology  2. Primary osteoarthritis of right knee  - AMB referral to orthopedics  3. Essential  hypertension, benign Continue metoprolol 25 mg tablet by mouth twice a day and lisinopril 5 mg tablet by mouth daily DASH diet  4. Iron deficiency anemia  - Vitamin B12 - Folate RBC - Iron and TIBC - Ferritin  Patient was extensively counseled about nutrition and exercise as tolerated  Return in about 3 months (around 09/11/2014), or if symptoms worsen or fail to improve, for Follow up HTN, Heart Failure and  Hypertension.  The patient was given clear instructions to go to ER or return to medical center if symptoms don't improve, worsen or new problems develop. The patient verbalized understanding. The patient was told to call to get lab results if they haven't heard anything in the next week.   This note has been created with Surveyor, quantity. Any transcriptional errors are unintentional.    Angelica Chessman, MD, [place], [place], [place] [facility] [place], [place]   06/11/2014, 4:33 PM

## 2014-06-11 NOTE — Patient Instructions (Signed)
Anemia, Nonspecific Anemia is a condition in which the concentration of red blood cells or hemoglobin in the blood is below normal. Hemoglobin is a substance in red blood cells that carries oxygen to the tissues of the body. Anemia results in not enough oxygen reaching these tissues.  CAUSES  Common causes of anemia include:   Excessive bleeding. Bleeding may be internal or external. This includes excessive bleeding from periods (in women) or from the intestine.   Poor nutrition.   Chronic kidney, thyroid, and liver disease.  Bone marrow disorders that decrease red blood cell production.  Cancer and treatments for cancer.  HIV, AIDS, and their treatments.  Spleen problems that increase red blood cell destruction.  Blood disorders.  Excess destruction of red blood cells due to infection, medicines, and autoimmune disorders. SIGNS AND SYMPTOMS   Minor weakness.   Dizziness.   Headache.  Palpitations.   Shortness of breath, especially with exercise.   Paleness.  Cold sensitivity.  Indigestion.  Nausea.  Difficulty sleeping.  Difficulty concentrating. Symptoms may occur suddenly or they may develop slowly.  DIAGNOSIS  Additional blood tests are often needed. These help your health care provider determine the best treatment. Your health care provider will check your stool for blood and look for other causes of blood loss.  TREATMENT  Treatment varies depending on the cause of the anemia. Treatment can include:   Supplements of iron, vitamin B12, or folic acid.   Hormone medicines.   A blood transfusion. This may be needed if blood loss is severe.   Hospitalization. This may be needed if there is significant continual blood loss.   Dietary changes.  Spleen removal. HOME CARE INSTRUCTIONS Keep all follow-up appointments. It often takes many weeks to correct anemia, and having your health care provider check on your condition and your response to  treatment is very important. SEEK IMMEDIATE MEDICAL CARE IF:   You develop extreme weakness, shortness of breath, or chest pain.   You become dizzy or have trouble concentrating.  You develop heavy vaginal bleeding.   You develop a rash.   You have bloody or black, tarry stools.   You faint.   You vomit up blood.   You vomit repeatedly.   You have abdominal pain.  You have a fever or persistent symptoms for more than 2-3 days.   You have a fever and your symptoms suddenly get worse.   You are dehydrated.  MAKE SURE YOU:  Understand these instructions.  Will watch your condition.  Will get help right away if you are not doing well or get worse. Document Released: 12/10/2004 Document Revised: 07/05/2013 Document Reviewed: 04/28/2013 ExitCare Patient Information 2015 [employer]. This information is not intended to replace advice given to you by your health care provider. Make sure you discuss any questions you have with your health care provider.  

## 2014-06-12 LAB — VITAMIN B12: Vitamin B-12: 1161 pg/mL — ABNORMAL HIGH (ref 211–911)

## 2014-06-12 LAB — IRON AND TIBC
%SAT: 18 % — ABNORMAL LOW (ref 20–55)
Iron: 88 ug/dL (ref 42–165)
TIBC: 496 ug/dL — AB (ref 215–435)
UIBC: 408 ug/dL — AB (ref 125–400)

## 2014-06-12 LAB — FERRITIN: Ferritin: 54 ng/mL (ref 22–322)

## 2014-06-15 ENCOUNTER — Encounter: Payer: Self-pay | Admitting: Sports Medicine

## 2014-06-15 ENCOUNTER — Telehealth: Payer: Self-pay | Admitting: Sports Medicine

## 2014-06-15 ENCOUNTER — Ambulatory Visit (INDEPENDENT_AMBULATORY_CARE_PROVIDER_SITE_OTHER): Payer: Medicare HMO | Admitting: Sports Medicine

## 2014-06-15 ENCOUNTER — Ambulatory Visit
Admission: RE | Admit: 2014-06-15 | Discharge: 2014-06-15 | Disposition: A | Discharge status: Home / Self Care | Payer: Commercial Managed Care - HMO | Source: Ambulatory Visit | Attending: Sports Medicine | Admitting: Sports Medicine

## 2014-06-15 VITALS — BP 146/89 | HR 65 | Ht 70.0 in | Wt 220.0 lb

## 2014-06-15 DIAGNOSIS — M25561 Pain in right knee: Secondary | ICD-10-CM

## 2014-06-15 DIAGNOSIS — M25569 Pain in unspecified knee: Secondary | ICD-10-CM

## 2014-06-15 MED ORDER — METHYLPREDNISOLONE ACETATE 40 MG/ML IJ SUSP
40.0000 mg | Freq: Once | INTRAMUSCULAR | Status: AC
Start: 2014-06-15 — End: 2014-06-15
  Administered 2014-06-15: 40 mg via INTRA_ARTICULAR

## 2014-06-15 NOTE — Progress Notes (Signed)
  Steven Hale - 58 y.o. male MRN [id number]  Date of birth: February 12, 1956  SUBJECTIVE:     58 year old male with a complex PMH presents with complaints of right knee pain.  Patient reports he has had long-standing right knee pain.  He states that his pain worsened after knee arthroscopy in 2013.  He states at that time and has not been the same.  He reports the pain is located both medially and laterally.  Pain is moderate to severe.  He reports chronic swelling and notes significant popping, locking, grinding, clicking.  He states that it often feels like it's going to give way.  His pain is worse with certain physical activities and particularly getting up from a seated position.  No recent interventions tried.  He has had several corticosteroid injections in the past with some improvement (states no improvement after the 4th injection).  No recent trauma, injury, or fall.   ROS:     Per HPI  PERTINENT  PMH / PSH / SH:  Past Medical, Surgical, Social History Reviewed. Pertinent Historical Findings include:  Prior Right knee arthroscopy done by Dr. Theda Sers (6/13 per patient).  He has had several corticosteroid injections with some improvement.  Per report, he has significant OA and has been told that he will need a knee replacement sometime in the future.  OBJECTIVE: BP 132/90  Pulse 108  Ht 5\' 11"  (1.803 m)  Wt 163 lb (73.936 kg)  BMI 22.74 kg/m2  Physical Exam:  Vital signs are reviewed. General: Well-appearing male in no acute distress.  MSK: Knee - Right Inspection - no erythema. 2+ effusion noted. Palpation normal with no warmth, joint line tenderness, patellar tenderness, or condyle tenderness. ROM - 90 of flexion; lacking approximately 15 of extension Ligaments with solid consistent endpoints including ACL, PCL, LCL, MCL. Negative Mcmurray's.  Non painful patellar compression. Patellar glide with significant crepitus. Patellar and quadriceps tendons unremarkable. Hamstring and  quadriceps strength is normal.   Procedure: Right knee aspiration and corticosteroid injection Consent signed and scanned into record. Medication:  1 cc Solumedrol  3 cc Lidocaine 1% without epi; ~ 3 cc of Lidocaine use for local anesthesia prior to aspiration. Preparation: area cleansed with alcohol and betadine Area was prepped in sterile fashion. 18-gauge needle was used to aspirate large effusion using a superior lateral approach. 47 cc was removed.  Syringe was then removed and above medication was injected. Patient tolerated well without bleeding or paresthesias  Patient had good range of motion of joint after injection  ASSESSMENT & PLAN: See problem based charting.

## 2014-06-15 NOTE — Telephone Encounter (Signed)
X-rays of the right knee reviewed. There are only mild degenerative changes present. Review of the notes by Dr.Collins shows that this patient has failed 2 rounds of Visco supplementation as well as cortisone injections. A postoperative MRI did show some increasing arthritis primarily involving the medial compartment but if this patient's effusion re-accumulates at followup, I will consider re-aspiration and fluid analysis.

## 2014-06-15 NOTE — Assessment & Plan Note (Signed)
Given past history, this is likely secondary to underlying degenerative changes. Aspiration and injection was done today.  Patient tolerated well and felt better following procedure. No x-rays are available in Epic.  Obtaining today. Patient to followup in approximately 3 weeks for repeat evaluation.

## 2014-06-18 LAB — FOLATE RBC: RBC Folate: 736 ng/mL (ref >280)

## 2014-07-09 ENCOUNTER — Encounter: Payer: Self-pay | Admitting: Sports Medicine

## 2014-07-09 ENCOUNTER — Ambulatory Visit (INDEPENDENT_AMBULATORY_CARE_PROVIDER_SITE_OTHER): Payer: Medicare HMO | Admitting: Sports Medicine

## 2014-07-09 VITALS — BP 114/72 | Ht 70.0 in | Wt 214.0 lb

## 2014-07-09 DIAGNOSIS — M25561 Pain in right knee: Secondary | ICD-10-CM

## 2014-07-09 DIAGNOSIS — M25569 Pain in unspecified knee: Secondary | ICD-10-CM

## 2014-07-09 NOTE — Progress Notes (Signed)
   Subjective:    Patient ID: Steven Hale, male    DOB: 06/27/1956, 58 y.o.   MRN: [id number]  HPI Steven Hale returns to clinic for follow up of right knee pain. He reports minimal change since his last visit when we aspirated 47cc of fluid and gave him a steroid injection. He also reports constant knee swelling with pain to the medial and lateral sides of his knee. He walks about a mile a day and does knee exercises in the morning - both of which are tolerable, but causes him some pain after he completes the workout. He takes about 3-5 tabs of Aleve or Motrin daily with minimal relief. He has also tried ice, heat, and knee sleeve with minimal relief.   Review of Systems He denies swelling in any other joints    Objective:   Physical Exam Gen: well-dressed, well-nourished man sitting comfortably  Right knee: trace effusion, no erythema. No tenderness to palpation of patellar/quad tendons, patella, or over the joint line. Negative McMurray. Full range of motion.  Recent x-rays of his right knee showed only minimal degenerative changes     Assessment & Plan:  **Right knee pain: - likely secondary to arthritic changes, though modest arthritis on imaging suggest that another etiology may be contributory. Reviewed x-ray findings from previous visit with patient. - rheum referral to Dr. Amil Amen for eval - counseled patient on freq of NSAID use - f/u after rheum appt. If Dr Amil Amen does not suspect a rheumatological cause for his reoccurring knee effusion, the patient will be referred back over to his orthopedic surgeon (Dr. Theda Sers) to discuss further treatment which may include partial or total knee arthroplasty.  Written By: Ivan Anchors, MS4

## 2014-07-10 ENCOUNTER — Telehealth: Payer: Self-pay | Admitting: Internal Medicine

## 2014-07-10 ENCOUNTER — Telehealth: Payer: Self-pay | Admitting: *Deleted

## 2014-07-10 NOTE — Telephone Encounter (Signed)
Called pt with appt for Dr. Amil Amen, 08/16/14 at 9am.  Due to his insurance of [employer] his PCP needs to setup this referral.  I left a msg with Dr. Doreene Burke to refer.  All paperwork from our office has been faxed to Dr. Amil Amen.

## 2014-07-10 NOTE — Telephone Encounter (Signed)
Pt calling for lab work results. Please f/u with pt.

## 2014-07-10 NOTE — Telephone Encounter (Signed)
Pt Aware of lab Result, Stated he will stop by to get copies.      Notes Recorded by Angelica Chessman, MD on 06/15/2014 at 5:47 PM Please inform patient that his anemia panel did not show the exact reason why he has anemia. His anemia is only mild so we will only needs to monitor. We also await colonoscopy.

## 2014-07-17 ENCOUNTER — Encounter: Payer: Self-pay | Admitting: Internal Medicine

## 2014-07-20 ENCOUNTER — Encounter: Payer: Self-pay | Admitting: *Deleted

## 2014-07-20 DIAGNOSIS — M1711 Unilateral primary osteoarthritis, right knee: Secondary | ICD-10-CM

## 2014-08-01 ENCOUNTER — Encounter: Payer: Self-pay | Admitting: Gastroenterology

## 2014-08-16 HISTORY — PX: PERIPHERAL VASCULAR CATHETERIZATION: SHX172C

## 2014-08-21 ENCOUNTER — Encounter: Payer: Self-pay | Admitting: Cardiovascular Disease

## 2014-08-21 ENCOUNTER — Ambulatory Visit (INDEPENDENT_AMBULATORY_CARE_PROVIDER_SITE_OTHER): Payer: Medicare HMO | Admitting: Cardiovascular Disease

## 2014-08-21 VITALS — BP 130/84 | HR 60 | Ht 70.0 in | Wt 219.1 lb

## 2014-08-21 DIAGNOSIS — E785 Hyperlipidemia, unspecified: Secondary | ICD-10-CM

## 2014-08-21 DIAGNOSIS — I2510 Atherosclerotic heart disease of native coronary artery without angina pectoris: Secondary | ICD-10-CM

## 2014-08-21 DIAGNOSIS — I739 Peripheral vascular disease, unspecified: Secondary | ICD-10-CM

## 2014-08-21 LAB — PROTIME-INR
INR: 1 ratio (ref 0.8–1.0)
PROTHROMBIN TIME: 11.5 s (ref 9.6–13.1)

## 2014-08-21 LAB — CBC
HCT: 37.3 % — ABNORMAL LOW (ref 39.0–52.0)
HEMOGLOBIN: 12.2 g/dL — AB (ref 13.0–17.0)
MCHC: 32.6 g/dL (ref 30.0–36.0)
MCV: 94.2 fl (ref 78.0–100.0)
PLATELETS: 267 10*3/uL (ref 150.0–400.0)
RBC: 3.96 Mil/uL — ABNORMAL LOW (ref 4.22–5.81)
RDW: 14.1 % (ref 11.5–15.5)
WBC: 5.4 10*3/uL (ref 4.0–10.5)

## 2014-08-21 LAB — BASIC METABOLIC PANEL
BUN: 14 mg/dL (ref 6–23)
CALCIUM: 9.5 mg/dL (ref 8.4–10.5)
CO2: 28 mEq/L (ref 19–32)
Chloride: 102 mEq/L (ref 96–112)
Creatinine, Ser: 1.1 mg/dL (ref 0.4–1.5)
GFR: 88.28 mL/min (ref >60.00)
GLUCOSE: 106 mg/dL — AB (ref 70–99)
Potassium: 3.9 mEq/L (ref 3.5–5.1)
Sodium: 138 mEq/L (ref 135–145)

## 2014-08-21 NOTE — Assessment & Plan Note (Signed)
Lab Results  Component Value Date   CHOL 168 05/31/2014   HDL 51 05/31/2014   LDLCALC 85 05/31/2014   LDLDIRECT 80 11/06/2013   TRIG 158* 05/31/2014   CHOLHDL 3.3 05/31/2014   Continue treatment with atorvastatin.

## 2014-08-21 NOTE — Assessment & Plan Note (Addendum)
There is evidence of significant restenosis in left SFA and now with severe bilateral calf claudication (R[place] class 3) worse on the left side in spite of optimal medical therapy and attempted walking on treadmill.  Thus, I recommend proceeding with abdominal aortogram with runoff and possible endovascular intervention with the intention to use a drug-coated balloon to decrease the chance of restenosis. Risks and benefits were explained to the patient.

## 2014-08-21 NOTE — Patient Instructions (Signed)
Your physician has requested that you have a peripheral vascular angiogram. This exam is performed at the hospital. During this exam IV contrast is used to look at arterial blood flow. Please review the information sheet given for details.  Your physician recommends that you have lab work today: BMP, CBC and PT/INR  Your physician recommends that you continue on your current medications as directed. Please refer to the Current Medication list given to you today.

## 2014-08-21 NOTE — Assessment & Plan Note (Signed)
He has no symptoms of angina. Continue medical therapy.

## 2014-08-21 NOTE — Progress Notes (Signed)
HPI: This is a 58 year old male who is here today for a followup visit regarding  peripheral arterial disease. He has known history of coronary artery disease with multiple interventions in the past. Cardiac catheterization in November of 2011 showed severe three-vessel coronary disease and he underwent coronary artery bypass graft surgery. He also had SFA stents placed bilaterally years ago. He quit smoking in 2009.  He was seen in 08/2013 for significant worsening of Claudication and cramping of left calf.  I proceeded with angiography at that time which showed an occluded distal left SFA starting above the previously placed stent. I performed directional atherectomy and balloon angioplasty which established normal flow.Post procedure ABI was normal.  He has been doing reasonably well. He denies calf claudication. He has episodes of generalized pain which makes him paralyzed according to him when that happened. Lower extremity arterial duplex in June showed focal in-stent restenosis in the left SFA with a velocity of around 380. No intervention was done due to lack of significant claudication. He now reports bilateral calf claudication worse on the left after walking just 3 minutes on treadmill. This forces him to stop for few minutes before he can resume. He also complains of bilateral foot numbness and burning sensation.    No Known Allergies   Current Outpatient Prescriptions on File Prior to Visit  Medication Sig Dispense Refill  . aspirin EC 81 MG tablet Take 81 mg by mouth daily.        [name] atorvastatin (LIPITOR) 40 MG tablet Take 0.5 tablets (20 mg total) by mouth daily.  90 tablet  1  . celecoxib (CELEBREX) 100 MG capsule Take 100 mg by mouth 2 (two) times daily.      . clopidogrel (PLAVIX) 75 MG tablet Take 1 tablet (75 mg total) by mouth daily.  90 tablet  1  . fish oil-omega-3 fatty acids 1000 MG capsule Take 1 capsule (1 g total) by mouth daily.  60 capsule  1  . gabapentin  (NEURONTIN) 300 MG capsule Take 300 mg by mouth 3 (three) times daily.      [name] lisinopril (PRINIVIL,ZESTRIL) 5 MG tablet Take 0.5 tablets (2.5 mg total) by mouth daily. Takes half tablet  45 tablet  1  . meclizine (ANTIVERT) 12.5 MG tablet Take 1 tablet (12.5 mg total) by mouth 3 (three) times daily as needed for dizziness (also available over the counter).  30 tablet  3  . metoprolol tartrate (LOPRESSOR) 25 MG tablet Take 25 mg by mouth 2 (two) times daily.      . Multiple Vitamin (MULTIVITAMIN) tablet Take 1 tablet by mouth daily.  30 tablet  10  . nitroGLYCERIN (NITROSTAT) 0.4 MG SL tablet Place 1 tablet (0.4 mg total) under the tongue every 5 (five) minutes as needed for chest pain.  15 tablet  0  . oxyCODONE-acetaminophen (PERCOCET/ROXICET) 5-325 MG per tablet Take 1-2 tablets by mouth every 6 (six) hours as needed for severe pain.  20 tablet  0  . tadalafil (CIALIS) 20 MG tablet Take 0.5-1 tablets (10-20 mg total) by mouth every other day as needed for erectile dysfunction.  5 tablet  11  . tapentadol (NUCYNTA) 50 MG TABS tablet Take 50 mg by mouth every 4 (four) hours as needed for severe pain.       Current Facility-Administered Medications on File Prior to Visit  Medication Dose Route Frequency Provider Last Rate Last Dose  . testosterone cypionate (DEPOTESTOTERONE CYPIONATE) injection 200 mg  200  mg Intramuscular Once Tresa Garter, MD         Past Medical History  Diagnosis Date  . PAD (peripheral artery disease)     a. s/p bilat SFA stents;  b. ABIs 11/2012: R 0.99, L 0.86.  [name] Hypertension   . Hyperlipidemia   . Anemia, unspecified   . GERD (gastroesophageal reflux disease)   . Helicobacter pylori (H. pylori) infection   . Hemorrhoids   . Impotence of organic origin   . Bell's palsy   . CAD (coronary artery disease)     a. s/p multiple PCIs;  b. s/p CABG in 09/2010 (LIMA-LAD, SVG-OM1, SVG-distal RCA/OM2);  c.  Myoview 05/2012: EF 54%, no ischemia, no scar.   . Blood  transfusion     during treatment for Ca  . DJD (degenerative joint disease)     low back & all over   . Pancreatic cancer     surgery 2009     Past Surgical History  Procedure Laterality Date  . Pancreatic  cancer  05/10/2008    Resection of distal panrease and spleen  . Splenectomy    . Coronary artery bypass graft  nov 2011  . Back surgery      1992  . Cardiac catheterization    . Hemorrhoid surgery  10/30/2011    Procedure: HEMORRHOIDECTOMY;  Surgeon: Harl Bowie, MD;  Location: [facility];  Service: General;  Laterality: N/A;     Family History  Problem Relation Age of Onset  . Heart attack Father     died of MI at age 45  . Cancer Father     pt unaware of what kind  . Anesthesia problems Neg Hx   . Hypotension Neg Hx   . Malignant hyperthermia Neg Hx   . Pseudochol deficiency Neg Hx      History   Social History  . Marital Status: Single    Spouse Name: N/A    Number of Children: 0  . Years of Education: N/A   Occupational History  .     Social History Main Topics  . Smoking status: Former Smoker    Types: Cigarettes    Quit date: 11/29/2008  . Smokeless tobacco: Never Used  . Alcohol Use: 1.2 oz/week    2 Glasses of wine per week     Comment: everyday  . Drug Use: No  . Sexual Activity: Not Currently   Other Topics Concern  . 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 He appears well-developed and well-nourished. No distress.  HENT: No nasal discharge.  Head: Normocephalic and atraumatic.  Eyes: Pupils are equal and round. Right eye exhibits no discharge. Left eye exhibits no discharge.  Neck: Normal range of motion. Neck supple. No JVD present. No thyromegaly  present.  Cardiovascular: Normal rate, regular rhythm, normal heart sounds and. Exam reveals no gallop and no friction rub. No murmur heard.  Pulmonary/Chest: Effort normal and breath sounds normal. No stridor. No respiratory distress. He has no wheezes. He has no rales. He exhibits no tenderness.  Abdominal: Soft. Bowel sounds are normal.  He exhibits no distension. There is no tenderness. There is no rebound and no guarding.  Musculoskeletal: Normal range of motion. He exhibits no edema and no tenderness.  Neurological: He is alert and oriented to person, place, and time. Coordination normal.  Skin: Skin is warm and dry. No rash noted. He is not diaphoretic. No erythema. No pallor.  Psychiatric: He has a normal mood and affect. His behavior is normal. Judgment and thought content normal.  Vascular: Femoral pulses are slightly diminished bilaterally. PT: +1 in the right side and 0 on the left side, DP: + 1 On the right side and 0 on the left side.      ASSESSMENT AND PLAN

## 2014-08-23 ENCOUNTER — Encounter (HOSPITAL_COMMUNITY): Payer: Self-pay | Admitting: Pharmacy Technician

## 2014-08-27 ENCOUNTER — Other Ambulatory Visit (HOSPITAL_COMMUNITY): Payer: Self-pay | Admitting: Cardiology

## 2014-08-27 DIAGNOSIS — I739 Peripheral vascular disease, unspecified: Secondary | ICD-10-CM

## 2014-08-29 ENCOUNTER — Encounter (HOSPITAL_COMMUNITY)
Admission: RE | Disposition: A | Discharge status: Home / Self Care | Payer: Self-pay | Source: Ambulatory Visit | Attending: Cardiovascular Disease

## 2014-08-29 ENCOUNTER — Ambulatory Visit (HOSPITAL_COMMUNITY)
Admission: RE | Admit: 2014-08-29 | Discharge: 2014-08-29 | Disposition: A | Discharge status: Home / Self Care | Payer: Medicare HMO | Source: Ambulatory Visit | Attending: Cardiovascular Disease | Admitting: Cardiovascular Disease

## 2014-08-29 DIAGNOSIS — I2510 Atherosclerotic heart disease of native coronary artery without angina pectoris: Secondary | ICD-10-CM | POA: Insufficient documentation

## 2014-08-29 DIAGNOSIS — I739 Peripheral vascular disease, unspecified: Secondary | ICD-10-CM

## 2014-08-29 DIAGNOSIS — T82858A Stenosis of vascular prosthetic devices, implants and grafts, initial encounter: Secondary | ICD-10-CM | POA: Insufficient documentation

## 2014-08-29 DIAGNOSIS — Z8507 Personal history of malignant neoplasm of pancreas: Secondary | ICD-10-CM | POA: Diagnosis not present

## 2014-08-29 DIAGNOSIS — Z7982 Long term (current) use of aspirin: Secondary | ICD-10-CM | POA: Insufficient documentation

## 2014-08-29 DIAGNOSIS — E785 Hyperlipidemia, unspecified: Secondary | ICD-10-CM | POA: Diagnosis not present

## 2014-08-29 DIAGNOSIS — K219 Gastro-esophageal reflux disease without esophagitis: Secondary | ICD-10-CM | POA: Insufficient documentation

## 2014-08-29 DIAGNOSIS — I70212 Atherosclerosis of native arteries of extremities with intermittent claudication, left leg: Secondary | ICD-10-CM | POA: Insufficient documentation

## 2014-08-29 DIAGNOSIS — I10 Essential (primary) hypertension: Secondary | ICD-10-CM | POA: Insufficient documentation

## 2014-08-29 DIAGNOSIS — Z951 Presence of aortocoronary bypass graft: Secondary | ICD-10-CM | POA: Insufficient documentation

## 2014-08-29 DIAGNOSIS — Z87891 Personal history of nicotine dependence: Secondary | ICD-10-CM | POA: Insufficient documentation

## 2014-08-29 DIAGNOSIS — Z7902 Long term (current) use of antithrombotics/antiplatelets: Secondary | ICD-10-CM | POA: Insufficient documentation

## 2014-08-29 DIAGNOSIS — M1990 Unspecified osteoarthritis, unspecified site: Secondary | ICD-10-CM | POA: Insufficient documentation

## 2014-08-29 HISTORY — PX: ABDOMINAL AORTAGRAM: SHX5454

## 2014-08-29 LAB — POCT ACTIVATED CLOTTING TIME: Activated Clotting Time: 174 s

## 2014-08-29 SURGERY — ABDOMINAL AORTAGRAM
Anesthesia: LOCAL

## 2014-08-29 MED ORDER — SODIUM CHLORIDE 0.9 % IJ SOLN: 3.0000 mL | INTRAMUSCULAR | Status: DC | PRN

## 2014-08-29 MED ORDER — SODIUM CHLORIDE 0.9 % IV SOLN
INTRAVENOUS | Status: DC
  Administered 2014-08-29: 07:00:00 via INTRAVENOUS

## 2014-08-29 MED ORDER — HEPARIN (PORCINE) IN NACL 2-0.9 UNIT/ML-% IJ SOLN
INTRAMUSCULAR | Status: AC
  Filled 2014-08-29: qty 1000

## 2014-08-29 MED ORDER — SODIUM CHLORIDE 0.9 % IV SOLN: 250.0000 mL | INTRAVENOUS | Status: DC | PRN

## 2014-08-29 MED ORDER — FENTANYL CITRATE 0.05 MG/ML IJ SOLN
INTRAMUSCULAR | Status: AC
  Filled 2014-08-29: qty 2

## 2014-08-29 MED ORDER — ASPIRIN 81 MG PO CHEW: 81.0000 mg | CHEWABLE_TABLET | ORAL | Status: DC

## 2014-08-29 MED ORDER — SODIUM CHLORIDE 0.9 % IV SOLN: INTRAVENOUS | Status: AC

## 2014-08-29 MED ORDER — HEPARIN SODIUM (PORCINE) 1000 UNIT/ML IJ SOLN
INTRAMUSCULAR | Status: AC
  Filled 2014-08-29: qty 1

## 2014-08-29 MED ORDER — SODIUM CHLORIDE 0.9 % IJ SOLN: 3.0000 mL | Freq: Two times a day (BID) | INTRAMUSCULAR | Status: DC

## 2014-08-29 MED ORDER — MIDAZOLAM HCL 2 MG/2ML IJ SOLN
INTRAMUSCULAR | Status: AC
  Filled 2014-08-29: qty 2

## 2014-08-29 MED ORDER — LIDOCAINE HCL (PF) 1 % IJ SOLN
INTRAMUSCULAR | Status: AC
  Filled 2014-08-29: qty 30

## 2014-08-29 NOTE — H&P (View-Only) (Signed)
HPI: This is a 58 year old male who is here today for a followup visit regarding  peripheral arterial disease. He has known history of coronary artery disease with multiple interventions in the past. Cardiac catheterization in November of 2011 showed severe three-vessel coronary disease and he underwent coronary artery bypass graft surgery. He also had SFA stents placed bilaterally years ago. He quit smoking in 2009.  He was seen in 08/2013 for significant worsening of Claudication and cramping of left calf.  I proceeded with angiography at that time which showed an occluded distal left SFA starting above the previously placed stent. I performed directional atherectomy and balloon angioplasty which established normal flow.Post procedure ABI was normal.  He has been doing reasonably well. He denies calf claudication. He has episodes of generalized pain which makes him paralyzed according to him when that happened. Lower extremity arterial duplex in June showed focal in-stent restenosis in the left SFA with a velocity of around 380. No intervention was done due to lack of significant claudication. He now reports bilateral calf claudication worse on the left after walking just 3 minutes on treadmill. This forces him to stop for few minutes before he can resume. He also complains of bilateral foot numbness and burning sensation.    No Known Allergies   Current Outpatient Prescriptions on File Prior to Visit  Medication Sig Dispense Refill  . aspirin EC 81 MG tablet Take 81 mg by mouth daily.        [name] atorvastatin (LIPITOR) 40 MG tablet Take 0.5 tablets (20 mg total) by mouth daily.  90 tablet  1  . celecoxib (CELEBREX) 100 MG capsule Take 100 mg by mouth 2 (two) times daily.      . clopidogrel (PLAVIX) 75 MG tablet Take 1 tablet (75 mg total) by mouth daily.  90 tablet  1  . fish oil-omega-3 fatty acids 1000 MG capsule Take 1 capsule (1 g total) by mouth daily.  60 capsule  1  . gabapentin  (NEURONTIN) 300 MG capsule Take 300 mg by mouth 3 (three) times daily.      [name] lisinopril (PRINIVIL,ZESTRIL) 5 MG tablet Take 0.5 tablets (2.5 mg total) by mouth daily. Takes half tablet  45 tablet  1  . meclizine (ANTIVERT) 12.5 MG tablet Take 1 tablet (12.5 mg total) by mouth 3 (three) times daily as needed for dizziness (also available over the counter).  30 tablet  3  . metoprolol tartrate (LOPRESSOR) 25 MG tablet Take 25 mg by mouth 2 (two) times daily.      . Multiple Vitamin (MULTIVITAMIN) tablet Take 1 tablet by mouth daily.  30 tablet  10  . nitroGLYCERIN (NITROSTAT) 0.4 MG SL tablet Place 1 tablet (0.4 mg total) under the tongue every 5 (five) minutes as needed for chest pain.  15 tablet  0  . oxyCODONE-acetaminophen (PERCOCET/ROXICET) 5-325 MG per tablet Take 1-2 tablets by mouth every 6 (six) hours as needed for severe pain.  20 tablet  0  . tadalafil (CIALIS) 20 MG tablet Take 0.5-1 tablets (10-20 mg total) by mouth every other day as needed for erectile dysfunction.  5 tablet  11  . tapentadol (NUCYNTA) 50 MG TABS tablet Take 50 mg by mouth every 4 (four) hours as needed for severe pain.       Current Facility-Administered Medications on File Prior to Visit  Medication Dose Route Frequency Provider Last Rate Last Dose  . testosterone cypionate (DEPOTESTOTERONE CYPIONATE) injection 200 mg  200  mg Intramuscular Once Tresa Garter, MD         Past Medical History  Diagnosis Date  . PAD (peripheral artery disease)     a. s/p bilat SFA stents;  b. ABIs 11/2012: R 0.99, L 0.86.  [name] Hypertension   . Hyperlipidemia   . Anemia, unspecified   . GERD (gastroesophageal reflux disease)   . Helicobacter pylori (H. pylori) infection   . Hemorrhoids   . Impotence of organic origin   . Bell's palsy   . CAD (coronary artery disease)     a. s/p multiple PCIs;  b. s/p CABG in 09/2010 (LIMA-LAD, SVG-OM1, SVG-distal RCA/OM2);  c.  Myoview 05/2012: EF 54%, no ischemia, no scar.   . Blood  transfusion     during treatment for Ca  . DJD (degenerative joint disease)     low back & all over   . Pancreatic cancer     surgery 2009     Past Surgical History  Procedure Laterality Date  . Pancreatic  cancer  05/10/2008    Resection of distal panrease and spleen  . Splenectomy    . Coronary artery bypass graft  nov 2011  . Back surgery      1992  . Cardiac catheterization    . Hemorrhoid surgery  10/30/2011    Procedure: HEMORRHOIDECTOMY;  Surgeon: Harl Bowie, MD;  Location: [facility] OR;  Service: General;  Laterality: N/A;     Family History  Problem Relation Age of Onset  . Heart attack Father     died of MI at age 54  . Cancer Father     pt unaware of what kind  . Anesthesia problems Neg Hx   . Hypotension Neg Hx   . Malignant hyperthermia Neg Hx   . Pseudochol deficiency Neg Hx      History   Social History  . Marital Status: Single    Spouse Name: N/A    Number of Children: 0  . Years of Education: N/A   Occupational History  .     Social History Main Topics  . Smoking status: Former Smoker    Types: Cigarettes    Quit date: 11/29/2008  . Smokeless tobacco: Never Used  . Alcohol Use: 1.2 oz/week    2 Glasses of wine per week     Comment: everyday  . Drug Use: No  . Sexual Activity: Not Currently   Other Topics Concern  . 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 He appears well-developed and well-nourished. No distress.  HENT: No nasal discharge.  Head: Normocephalic and atraumatic.  Eyes: Pupils are equal and round. Right eye exhibits no discharge. Left eye exhibits no discharge.  Neck: Normal range of motion. Neck supple. No JVD present. No thyromegaly  present.  Cardiovascular: Normal rate, regular rhythm, normal heart sounds and. Exam reveals no gallop and no friction rub. No murmur heard.  Pulmonary/Chest: Effort normal and breath sounds normal. No stridor. No respiratory distress. He has no wheezes. He has no rales. He exhibits no tenderness.  Abdominal: Soft. Bowel sounds are normal.  He exhibits no distension. There is no tenderness. There is no rebound and no guarding.  Musculoskeletal: Normal range of motion. He exhibits no edema and no tenderness.  Neurological: He is alert and oriented to person, place, and time. Coordination normal.  Skin: Skin is warm and dry. No rash noted. He is not diaphoretic. No erythema. No pallor.  Psychiatric: He has a normal mood and affect. His behavior is normal. Judgment and thought content normal.  Vascular: Femoral pulses are slightly diminished bilaterally. PT: +1 in the right side and 0 on the left side, DP: + 1 On the right side and 0 on the left side.      ASSESSMENT AND PLAN

## 2014-08-29 NOTE — Interval H&P Note (Signed)
History and Physical Interval Note:  08/29/2014 8:26 AM  Steven Hale  has presented today for surgery, with the diagnosis of pvd  The various methods of treatment have been discussed with the patient and family. After consideration of risks, benefits and other options for treatment, the patient has consented to  Procedure(s): ABDOMINAL AORTAGRAM (N/A) as a surgical intervention .  The patient's history has been reviewed, patient examined, no change in status, stable for surgery.  I have reviewed the patient's chart and labs.  Questions were answered to the patient's satisfaction.     Kathlyn Sacramento

## 2014-08-29 NOTE — Progress Notes (Signed)
Site area: RFA Site Prior to Removal:  Level 0 Pressure Applied For:45 min Manual:    Patient Status During Pull: stable  Post Pull Site:  Level 0 Post Pull Instructions Given: yes  Post Pull Pulses Present: RT DP/PT 1 Dressing Applied:  yes Bedrest begins @ 3435 Comments:no complications

## 2014-08-29 NOTE — Discharge Instructions (Signed)

## 2014-08-29 NOTE — CV Procedure (Signed)
PERIPHERAL VASCULAR PROCEDURE  NAME:  Steven Hale   MRN: [id number] DOB:  09-14-1956   ADMIT DATE: 08/29/2014  Performing Cardiologist: Kathlyn Sacramento Primary Physician: Angelica Chessman, MD  Procedures Performed:  Abdominal Aortic Angiogram with Bi-Iliofemoral Runoff  Bilateral Lower Extremity Angiography (1st Order)  Left SFA angioplasty followed by drug coated balloon angioplasty   Indication(s):   Claudication    Consent: The procedure with Risks/Benefits/Alternatives and Indications was reviewed with the patient .  All questions were answered.  Medications:  Sedation:  1 mg IV Versed, 50 mcg IV Fentanyl  Contrast:  134 ml  Visipaque   Procedural details: The right groin was prepped, draped, and anesthetized with 1% lidocaine. Using modified Seldinger technique, a 5 French sheath was introduced into the right common femoral artery. A 5 Fr Short Pigtail Catheter was advanced of over a  Versicore wire into the descending Aorta to a level just above the renal arteries. A power injection of 86ml/sec contrast over 1 sec was performed for Abdominal Aortic Angiography.  The catheter was then pulled back to a level just above the Aortic bifurcation, and a second power injection was performed to evaluate the iliac arteries.   Bilateral lower extremity arterial run off was then performed via power injection of 7 ml / sec contrast for a total of 77 ml.   Interventional Procedure:  The pigtail catheter was changed over the Versicore wire for A crossover catheter which was then pulled back the aortic bifurcation and the wire was advanced down the contralateral common iliac artery.  The wire was then advanced to the contralateral common femoral artery, the catheter and sheath were exchanged for a 6 French 45 cm destination sheath. However, there was not enough support to advance the sheath. The catheter was then advanced again. I placed a Rosen wire for support and was able to  advance the sheath over the wire to the common femoral artery.  The patient was initially given 6000 units of unfractionated heparin but ACT was 197. He was given an additional 2000 units. The lesion in the distal SFA was crossed with a versicore wire. Balloon angioplasty was performed with a 4 x 40 mm mustang balloon. I then used a 6 x 60 mm Lutonix balloon which was inflated to 8 atmospheres for 2 minutes. Final angiography showed excellent results with 0% residual stenosis and no evidence of dissection.  The sheath was secured in place to be removed manually. The patient tolerated the procedure well with no immediate complications.    Hemodynamics:  Central Aortic Pressure / Mean Aortic Pressure: 122/71  Findings:  Abdominal aorta: Normal in size with no evidence of aneurysm or significant stenosis.  Left renal artery: Normal  Right renal artery: Normal  Celiac artery: Not visualized  Superior mesenteric artery: Patent  Right common iliac artery: Normal in size with 20% stenosis.  Right internal iliac artery: Patent  Right external iliac artery: Normal  Right common femoral artery: Normal  Right profunda femoral artery: Normal  Right superficial femoral artery: Minor irregularities proximally. A stent is noted in the mid to distal segment which is patent with minimal restenosis.  Right popliteal artery: Minor irregularities.  Three-vessel runoff below the knee.  Left common iliac artery:  20% proximal stenosis   Left internal iliac artery: Subtotally occluded proximally  Left external iliac artery: Normal  Left common femoral artery: Normal  Left profunda femoral artery: Normal  Left superficial femoral artery:   Minor irregularities proximally with  mild atherosclerosis in the midsegment. There is an 80% restenosis distally at the proximal edge of the previously placed stent.   Left popliteal artery: Minor irregularities.  2 vessel runoff below the knee with an  occluded anterior tibial which reconstitutes distally via the peroneal artery.   Conclusions: 1. No significant aortoiliac disease. No significant obstructive disease affecting the right lower extremity with patent SFA stent. 2. Significant restenosis in the distal left SFA with two-vessel runoff below the knee. 3. Successful drug coated balloon angioplasty to the distal left SFA  Recommendations:  Continue aggressive treatment of risk factors.   Kathlyn Sacramento, MD, [facility] 08/29/2014 9:39 AM

## 2014-09-03 LAB — POCT ACTIVATED CLOTTING TIME
Activated Clotting Time: 197 seconds
Activated Clotting Time: 197 seconds

## 2014-09-05 ENCOUNTER — Other Ambulatory Visit: Payer: Self-pay | Admitting: Cardiology

## 2014-09-05 ENCOUNTER — Telehealth: Payer: Self-pay | Admitting: Cardiology

## 2014-09-05 NOTE — Telephone Encounter (Signed)
Pt wants to know if it is all right for him to take Celebrex,since he is on a blood thinner?

## 2014-09-05 NOTE — Telephone Encounter (Signed)
Pt is currently on plavix 75 mg once daily Will forward for dr Stanford Breed review

## 2014-09-05 NOTE — Telephone Encounter (Signed)
Would avoid celebrex if possible Kirk Ruths

## 2014-09-05 NOTE — Telephone Encounter (Signed)
Spoke with pt, Aware of dr crenshaw's recommendations.  °

## 2014-09-11 ENCOUNTER — Ambulatory Visit (INDEPENDENT_AMBULATORY_CARE_PROVIDER_SITE_OTHER): Payer: Medicare HMO | Admitting: Cardiovascular Disease

## 2014-09-11 ENCOUNTER — Ambulatory Visit (HOSPITAL_COMMUNITY): Payer: Medicare HMO | Attending: Internal Medicine | Admitting: Cardiology

## 2014-09-11 ENCOUNTER — Encounter: Payer: Self-pay | Admitting: Cardiovascular Disease

## 2014-09-11 VITALS — BP 124/70 | HR 80 | Ht 69.0 in | Wt 225.0 lb

## 2014-09-11 DIAGNOSIS — I739 Peripheral vascular disease, unspecified: Secondary | ICD-10-CM | POA: Diagnosis not present

## 2014-09-11 DIAGNOSIS — I2510 Atherosclerotic heart disease of native coronary artery without angina pectoris: Secondary | ICD-10-CM

## 2014-09-11 DIAGNOSIS — Z87891 Personal history of nicotine dependence: Secondary | ICD-10-CM | POA: Insufficient documentation

## 2014-09-11 DIAGNOSIS — E785 Hyperlipidemia, unspecified: Secondary | ICD-10-CM | POA: Diagnosis not present

## 2014-09-11 DIAGNOSIS — I10 Essential (primary) hypertension: Secondary | ICD-10-CM | POA: Diagnosis not present

## 2014-09-11 DIAGNOSIS — Z951 Presence of aortocoronary bypass graft: Secondary | ICD-10-CM | POA: Diagnosis not present

## 2014-09-11 NOTE — Assessment & Plan Note (Signed)
No angina. Continue medical therapy.  

## 2014-09-11 NOTE — Patient Instructions (Signed)
Your physician has requested that you have a lower extremity arterial duplex in 3 MONTHS. This test is an ultrasound of the arteries in the legs. It looks at arterial blood flow in the legs. Allow one hour for Lower Arterial scans. There are no restrictions or special instructions  Your physician wants you to follow-up in: 6 MONTHS with Dr Fletcher Anon.  You will receive a reminder letter in the mail two months in advance. If you don't receive a letter, please call our office to schedule the follow-up appointment.  Your physician recommends that you continue on your current medications as directed. Please refer to the Current Medication list given to you today.

## 2014-09-11 NOTE — Assessment & Plan Note (Signed)
Lab Results  Component Value Date   CHOL 168 05/31/2014   HDL 51 05/31/2014   LDLCALC 85 05/31/2014   LDLDIRECT 80 11/06/2013   TRIG 158* 05/31/2014   CHOLHDL 3.3 05/31/2014   Continue Atorvastatin.

## 2014-09-11 NOTE — Progress Notes (Signed)
HPI: This is a 58 year old male who is here today for a followup visit regarding  peripheral arterial disease. He has known history of coronary artery disease with multiple interventions in the past. Cardiac catheterization in November of 2011 showed severe three-vessel coronary disease and he underwent coronary artery bypass graft surgery. He also had SFA stents placed bilaterally years ago. He quit smoking in 2009.  He was seen in 08/2013 for significant worsening of Claudication and cramping of left calf.  I proceeded with angiography at that time which showed an occluded distal left SFA starting above the previously placed stent. I performed directional atherectomy and balloon angioplasty which established normal flow.Post procedure ABI was normal.   He was seen recently for bilateral calf claudication worse on the left after walking just 3 minutes on treadmill. There was evidence of restenosis.  I proceeded with angiography in 08/2014 which showed:  1. No significant aortoiliac disease. No significant obstructive disease affecting the right lower extremity with patent SFA stent.  2. Significant restenosis in the distal left SFA with two-vessel runoff below the knee.  3. Successful drug coated balloon angioplasty to the distal left SFA  He reports complete resolution of claudication.   No Known Allergies   Current Outpatient Prescriptions on File Prior to Visit  Medication Sig Dispense Refill  . aspirin EC 81 MG tablet Take 81 mg by mouth daily.        [name] atorvastatin (LIPITOR) 40 MG tablet Take 0.5 tablets (20 mg total) by mouth daily.  90 tablet  1  . celecoxib (CELEBREX) 100 MG capsule Take 100 mg by mouth 2 (two) times daily.      . clopidogrel (PLAVIX) 75 MG tablet Take 1 tablet (75 mg total) by mouth daily.  90 tablet  1  . cyclobenzaprine (FLEXERIL) 5 MG tablet Take 5 mg by mouth 2 (two) times daily as needed for muscle spasms.      . diclofenac (VOLTAREN) 75 MG EC tablet Take 75  mg by mouth 2 (two) times daily.      . fish oil-omega-3 fatty acids 1000 MG capsule Take 1 capsule (1 g total) by mouth daily.  60 capsule  1  . gabapentin (NEURONTIN) 300 MG capsule Take 600 mg by mouth 3 (three) times daily.       [name] lisinopril (PRINIVIL,ZESTRIL) 5 MG tablet TAKE 1/2 TABLET BY MOUTH DAILY  45 tablet  10  . meclizine (ANTIVERT) 12.5 MG tablet Take 1 tablet (12.5 mg total) by mouth 3 (three) times daily as needed for dizziness (also available over the counter).  30 tablet  3  . metoprolol tartrate (LOPRESSOR) 25 MG tablet Take 25 mg by mouth 2 (two) times daily.      . Multiple Vitamin (MULTIVITAMIN) tablet Take 1 tablet by mouth daily.  30 tablet  10  . nitroGLYCERIN (NITROSTAT) 0.4 MG SL tablet Place 1 tablet (0.4 mg total) under the tongue every 5 (five) minutes as needed for chest pain.  15 tablet  0  . oxyCODONE-acetaminophen (PERCOCET/ROXICET) 5-325 MG per tablet Take 1 tablet by mouth 3 (three) times daily as needed for severe pain.      [name] Propylene Glycol (SYSTANE BALANCE) 0.6 % SOLN Place 1 drop into both eyes daily.      . tadalafil (CIALIS) 20 MG tablet Take 0.5-1 tablets (10-20 mg total) by mouth every other day as needed for erectile dysfunction.  5 tablet  11  . tapentadol (NUCYNTA) 50 MG  TABS tablet Take 50 mg by mouth every 4 (four) hours as needed for severe pain.       No current facility-administered medications on file prior to visit.     Past Medical History  Diagnosis Date  . PAD (peripheral artery disease)     a. s/p bilat SFA stents;  b. ABIs 11/2012: R 0.99, L 0.86.  [name] Hypertension   . Hyperlipidemia   . Anemia, unspecified   . GERD (gastroesophageal reflux disease)   . Helicobacter pylori (H. pylori) infection   . Hemorrhoids   . Impotence of organic origin   . Bell's palsy   . CAD (coronary artery disease)     a. s/p multiple PCIs;  b. s/p CABG in 09/2010 (LIMA-LAD, SVG-OM1, SVG-distal RCA/OM2);  c.  Myoview 05/2012: EF 54%, no ischemia, no  scar.   . Blood transfusion     during treatment for Ca  . DJD (degenerative joint disease)     low back & all over   . Pancreatic cancer     surgery 2009     Past Surgical History  Procedure Laterality Date  . Pancreatic  cancer  05/10/2008    Resection of distal panrease and spleen  . Splenectomy    . Coronary artery bypass graft  nov 2011  . Back surgery      1992  . Cardiac catheterization    . Hemorrhoid surgery  10/30/2011    Procedure: HEMORRHOIDECTOMY;  Surgeon: Harl Bowie, MD;  [facility];  Service: General;  Laterality: N/A;     Family History  Problem Relation Age of Onset  . Heart attack Father     died of MI at age 55  . Cancer Father     pt unaware of what kind  . Anesthesia problems Neg Hx   . Hypotension Neg Hx   . Malignant hyperthermia Neg Hx   . Pseudochol deficiency Neg Hx      History   Social History  . Marital Status: Single    Spouse Name: N/A    Number of Children: 0  . Years of Education: N/A   Occupational History  .     Social History Main Topics  . Smoking status: Former Smoker    Types: Cigarettes    Quit date: 11/29/2008  . Smokeless tobacco: Never Used  . Alcohol Use: 1.2 oz/week    2 Glasses of wine per week     Comment: everyday  . Drug Use: No  . Sexual Activity: Not Currently   Other Topics Concern  . 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 He appears well-developed and well-nourished. No distress.  HENT: No nasal discharge.  Head: Normocephalic and atraumatic.  Eyes: Pupils are equal and round. Right eye exhibits no discharge. Left eye exhibits no discharge.  Neck: Normal range of motion. Neck supple. No JVD present. No thyromegaly  present.  Cardiovascular: Normal rate, regular rhythm, normal heart sounds and. Exam reveals no gallop and no friction rub. No murmur heard.  Pulmonary/Chest: Effort normal and breath sounds normal. No stridor. No respiratory distress. He has no wheezes.  He has no rales. He exhibits no tenderness.  Abdominal: Soft. Bowel sounds are normal. He exhibits no distension. There is no tenderness. There is no rebound and no guarding.  Musculoskeletal: Normal range of motion. He exhibits no edema and no tenderness.  Neurological: He is alert and oriented to person, place, and time. Coordination normal.  Skin: Skin is warm and dry. No rash noted. He is not diaphoretic. No erythema. No pallor.  Psychiatric: He has a normal mood and affect. His behavior is normal. Judgment and thought content normal.  Vascular: Femoral pulses are slightly diminished bilaterally. PT: +1 in the right side and +1 on the left side, DP: + 1 On the right side and 0 on the left side. No groin hematoma     ASSESSMENT AND PLAN

## 2014-09-11 NOTE — Progress Notes (Signed)
ABI performed  

## 2014-09-11 NOTE — Assessment & Plan Note (Signed)
He is doing very well after recent [facility] angioplasty for severe left SFA instent restenosis. Claudication resolved.  Obtain ABI today and Duplex in 3 months.  Continue current medical therapy.

## 2014-09-28 ENCOUNTER — Telehealth: Payer: Self-pay | Admitting: Emergency Medicine

## 2014-10-01 ENCOUNTER — Ambulatory Visit (INDEPENDENT_AMBULATORY_CARE_PROVIDER_SITE_OTHER): Payer: Commercial Managed Care - HMO | Admitting: Gastroenterology

## 2014-10-01 ENCOUNTER — Telehealth: Payer: Self-pay | Admitting: *Deleted

## 2014-10-01 ENCOUNTER — Encounter: Payer: Self-pay | Admitting: Gastroenterology

## 2014-10-01 VITALS — BP 120/84 | HR 84 | Ht 69.0 in | Wt 226.4 lb

## 2014-10-01 DIAGNOSIS — Z1212 Encounter for screening for malignant neoplasm of rectum: Secondary | ICD-10-CM

## 2014-10-01 DIAGNOSIS — C252 Malignant neoplasm of tail of pancreas: Secondary | ICD-10-CM

## 2014-10-01 DIAGNOSIS — Z1211 Encounter for screening for malignant neoplasm of colon: Secondary | ICD-10-CM | POA: Diagnosis not present

## 2014-10-01 DIAGNOSIS — I2510 Atherosclerotic heart disease of native coronary artery without angina pectoris: Secondary | ICD-10-CM | POA: Diagnosis not present

## 2014-10-01 NOTE — Assessment & Plan Note (Signed)
No evidence of recurrence 

## 2014-10-01 NOTE — Telephone Encounter (Signed)
  10/01/2014   RE: Safwan Tomei DOB: 01-30-1956 MRN: [id number]   Dear  Stanford Breed,   We have scheduled the above patient for an endoscopic procedure. Our records show that he is on anticoagulation therapy.   Please advise as to how long the patient may come off his therapy of plavix  prior to the procedure, which is scheduled for 12/12/2014.  Please fax back/ or route the completed form to [place] at [phone].   Sincerely,    Genella Mech

## 2014-10-01 NOTE — Assessment & Plan Note (Signed)
Asian has never had a colonoscopy.  This will be scheduled.  The risk of holding anticoagulation therapy or antiplatelet medications was discussed including the increased risk for thromboembolic disease that may include DVT, pulmonary emboli and stroke. The patient understands this risk and is willing to proceed with temporally holding the medication provided that this is approved by her PCP or cardiologist.

## 2014-10-01 NOTE — Patient Instructions (Signed)
You have been scheduled for a colonoscopy. Please follow written instructions given to you at your visit today.  Please pick up your prep kit at the pharmacy within the next 1-3 days. If you use inhalers (even only as needed), please bring them with you on the day of your procedure. Your physician has requested that you go to www.startemmi.com and enter the access code given to you at your visit today. This web site gives a general overview about your procedure. However, you should still follow specific instructions given to you by our office regarding your preparation for the procedure.  We have given you a Suprep sample kit

## 2014-10-01 NOTE — Progress Notes (Signed)
_                                                                                                                History of Present Illness:  Steven Hale a 58 year old African malewith history of malignant neoplasm of the tail the pancreas, status post resection, an area artery disease, peripheral vascular disease, on Plavix,referred for screening colonoscopy.  The patient has no GI complaints including change of bowel habits, abdominal pain, melena or hematochezia.he underwent resection of the tail the pancreas and splenectomy for a solid stool Later tumor the pancreas.  He's had no recurrences.   Past Medical History  Diagnosis Date  . PAD (peripheral artery disease)     a. s/p bilat SFA stents;  b. ABIs 11/2012: R 0.99, L 0.86.  [name] Hypertension   . Hyperlipidemia   . Anemia, unspecified   . GERD (gastroesophageal reflux disease)   . Helicobacter pylori (H. pylori) infection   . Hemorrhoids   . Impotence of organic origin   . Bell's palsy   . CAD (coronary artery disease)     a. s/p multiple PCIs;  b. s/p CABG in 09/2010 (LIMA-LAD, SVG-OM1, SVG-distal RCA/OM2);  c.  Myoview 05/2012: EF 54%, no ischemia, no scar.   . Blood transfusion     during treatment for Ca  . DJD (degenerative joint disease)     low back & all over   . Pancreatic cancer     surgery 2009   Past Surgical History  Procedure Laterality Date  . Pancreatic  cancer  05/10/2008    Resection of distal panrease and spleen  . Splenectomy    . Coronary artery bypass graft  nov 2011  . Back surgery      1992  . Cardiac catheterization    . Hemorrhoid surgery  10/30/2011    Procedure: HEMORRHOIDECTOMY;  Surgeon: Harl Bowie, MD;  Location: [place];  Service: General;  Laterality: N/A;   family history includes Heart attack in his father. There is no history of Anesthesia problems, Hypotension, Malignant hyperthermia, Pseudochol deficiency, Colon cancer, Colon polyps, Diabetes, Kidney disease, or  Esophageal cancer. Current Outpatient Prescriptions  Medication Sig Dispense Refill  . allopurinol (ZYLOPRIM) 100 MG tablet   0  . aspirin EC 81 MG tablet Take 81 mg by mouth daily.      [name] atorvastatin (LIPITOR) 40 MG tablet Take 0.5 tablets (20 mg total) by mouth daily. 90 tablet 1  . celecoxib (CELEBREX) 100 MG capsule Take 100 mg by mouth 2 (two) times daily.    . clopidogrel (PLAVIX) 75 MG tablet Take 1 tablet (75 mg total) by mouth daily. 90 tablet 1  . CYANOCOBALAMIN PO Vitamin B complex with B12    . cyclobenzaprine (FLEXERIL) 5 MG tablet Take 5 mg by mouth 2 (two) times daily as needed for muscle spasms.    . diclofenac (VOLTAREN) 75 MG EC tablet Take 75 mg by mouth 2 (two) times daily.    . fish oil-omega-3  fatty acids 1000 MG capsule Take 1 capsule (1 g total) by mouth daily. 60 capsule 1  . FLUVIRIN 0.5 ML SUSY   0  . gabapentin (NEURONTIN) 300 MG capsule Take 600 mg by mouth 3 (three) times daily.     [name] lisinopril (PRINIVIL,ZESTRIL) 5 MG tablet TAKE 1/2 TABLET BY MOUTH DAILY 45 tablet 10  . meclizine (ANTIVERT) 12.5 MG tablet Take 1 tablet (12.5 mg total) by mouth 3 (three) times daily as needed for dizziness (also available over the counter). 30 tablet 3  . metoprolol tartrate (LOPRESSOR) 25 MG tablet Take 25 mg by mouth 2 (two) times daily.    . Multiple Vitamin (MULTIVITAMIN) tablet Take 1 tablet by mouth daily. 30 tablet 10  . Naphazoline-Polyethyl Glycol 0.012-0.2 % SOLN     . nitroGLYCERIN (NITROSTAT) 0.4 MG SL tablet Place 1 tablet (0.4 mg total) under the tongue every 5 (five) minutes as needed for chest pain. 15 tablet 0  . oxyCODONE-acetaminophen (PERCOCET) 7.5-325 MG per tablet   0  . Propylene Glycol (SYSTANE BALANCE) 0.6 % SOLN Place 1 drop into both eyes daily.    . tadalafil (CIALIS) 20 MG tablet Take 0.5-1 tablets (10-20 mg total) by mouth every other day as needed for erectile dysfunction. 5 tablet 11  . tapentadol (NUCYNTA) 50 MG TABS tablet Take 50 mg by mouth  every 4 (four) hours as needed for severe pain.     No current facility-administered medications for this visit.   Allergies as of 10/01/2014  . (No Known Allergies)    reports that he quit smoking about 5 years ago. His smoking use included Cigarettes. He smoked 0.00 packs per day. He has never used smokeless tobacco. He reports that he drinks about 1.2 oz of alcohol per week. He reports that he does not use illicit drugs.   Review of Systems: he complains of low back painPertinent positive and negative review of systems were noted in the above HPI section. All other review of systems were otherwise negative.  Vital signs were reviewed in today's medical record Physical Exam: General: Well developed , well nourished, no acute distress Skin: anicteric Head: Normocephalic and atraumatic Eyes:  sclerae anicteric, EOMI Ears: Normal auditory acuity Mouth: No deformity or lesions Neck: Supple, no masses or thyromegaly Lungs: Clear throughout to auscultation Heart: Regular rate and rhythm; no murmurs, rubs or bruits Abdomen: Soft, non tender and non distended. No masses, hepatosplenomegaly or hernias noted. Normal Bowel sounds Rectal:deferred Musculoskeletal: Symmetrical with no gross deformities  Skin: No lesions on visible extremities Pulses:  Normal pulses noted Extremities: No clubbing, cyanosis, edema or deformities noted Neurological: Alert oriented x 4, grossly nonfocal Cervical Nodes:  No significant cervical adenopathy Inguinal Nodes: No significant inguinal adenopathy Psychological:  Alert and cooperative. Normal mood and affect  See Assessment and Plan under Problem List

## 2014-10-01 NOTE — Assessment & Plan Note (Signed)
Currently with stable cardiac function

## 2014-10-02 NOTE — Telephone Encounter (Signed)
Dc plavix 7 Days prior to procedure and resume after Kirk Ruths

## 2014-10-03 ENCOUNTER — Encounter (HOSPITAL_COMMUNITY): Payer: Self-pay | Admitting: *Deleted

## 2014-10-03 ENCOUNTER — Emergency Department (HOSPITAL_COMMUNITY)
Admission: EM | Admit: 2014-10-03 | Discharge: 2014-10-03 | Disposition: A | Discharge status: Home / Self Care | Payer: Medicare HMO | Attending: Emergency Medicine | Admitting: Emergency Medicine

## 2014-10-03 ENCOUNTER — Telehealth: Payer: Self-pay | Admitting: Cardiology

## 2014-10-03 DIAGNOSIS — M549 Dorsalgia, unspecified: Secondary | ICD-10-CM

## 2014-10-03 DIAGNOSIS — Z8719 Personal history of other diseases of the digestive system: Secondary | ICD-10-CM | POA: Diagnosis not present

## 2014-10-03 DIAGNOSIS — G8929 Other chronic pain: Secondary | ICD-10-CM | POA: Insufficient documentation

## 2014-10-03 DIAGNOSIS — I10 Essential (primary) hypertension: Secondary | ICD-10-CM | POA: Insufficient documentation

## 2014-10-03 DIAGNOSIS — N528 Other male erectile dysfunction: Secondary | ICD-10-CM | POA: Diagnosis not present

## 2014-10-03 DIAGNOSIS — Z7982 Long term (current) use of aspirin: Secondary | ICD-10-CM | POA: Diagnosis not present

## 2014-10-03 DIAGNOSIS — I2510 Atherosclerotic heart disease of native coronary artery without angina pectoris: Secondary | ICD-10-CM | POA: Insufficient documentation

## 2014-10-03 DIAGNOSIS — E785 Hyperlipidemia, unspecified: Secondary | ICD-10-CM | POA: Insufficient documentation

## 2014-10-03 DIAGNOSIS — Z8619 Personal history of other infectious and parasitic diseases: Secondary | ICD-10-CM | POA: Insufficient documentation

## 2014-10-03 DIAGNOSIS — Z7902 Long term (current) use of antithrombotics/antiplatelets: Secondary | ICD-10-CM | POA: Diagnosis not present

## 2014-10-03 DIAGNOSIS — I739 Peripheral vascular disease, unspecified: Secondary | ICD-10-CM | POA: Insufficient documentation

## 2014-10-03 DIAGNOSIS — Z791 Long term (current) use of non-steroidal anti-inflammatories (NSAID): Secondary | ICD-10-CM | POA: Insufficient documentation

## 2014-10-03 DIAGNOSIS — D649 Anemia, unspecified: Secondary | ICD-10-CM | POA: Insufficient documentation

## 2014-10-03 DIAGNOSIS — Z9889 Other specified postprocedural states: Secondary | ICD-10-CM | POA: Diagnosis not present

## 2014-10-03 DIAGNOSIS — Z79899 Other long term (current) drug therapy: Secondary | ICD-10-CM | POA: Insufficient documentation

## 2014-10-03 DIAGNOSIS — M545 Low back pain: Secondary | ICD-10-CM | POA: Insufficient documentation

## 2014-10-03 DIAGNOSIS — Z8507 Personal history of malignant neoplasm of pancreas: Secondary | ICD-10-CM | POA: Diagnosis not present

## 2014-10-03 DIAGNOSIS — Z87891 Personal history of nicotine dependence: Secondary | ICD-10-CM | POA: Insufficient documentation

## 2014-10-03 MED ORDER — HYDROMORPHONE HCL 1 MG/ML IJ SOLN
1.0000 mg | Freq: Once | INTRAMUSCULAR | Status: AC
  Administered 2014-10-03: 1 mg via INTRAMUSCULAR
  Filled 2014-10-03: qty 1

## 2014-10-03 NOTE — Discharge Instructions (Signed)
Please follow up with your pain specialist for further management of your back pain  Chronic Back Pain  When back pain lasts longer than 3 months, it is called chronic back pain.People with chronic back pain often go through certain periods that are more intense (flare-ups).  CAUSES Chronic back pain can be caused by wear and tear (degeneration) on different structures in your back. These structures include:  The bones of your spine (vertebrae) and the joints surrounding your spinal cord and nerve roots (facets).  The strong, fibrous tissues that connect your vertebrae (ligaments). Degeneration of these structures may result in pressure on your nerves. This can lead to constant pain. HOME CARE INSTRUCTIONS  Avoid bending, heavy lifting, prolonged sitting, and activities which make the problem worse.  Take brief periods of rest throughout the day to reduce your pain. Lying down or standing usually is better than sitting while you are resting.  Take over-the-counter or prescription medicines only as directed by your caregiver. SEEK IMMEDIATE MEDICAL CARE IF:   You have weakness or numbness in one of your legs or feet.  You have trouble controlling your bladder or bowels.  You have nausea, vomiting, abdominal pain, shortness of breath, or fainting. Document Released: 12/10/2004 Document Revised: 01/25/2012 Document Reviewed: 10/17/2011 [facility] Patient Information 2015 [place], [place]. This information is not intended to replace advice given to you by your health care provider. Make sure you discuss any questions you have with your health care provider.

## 2014-10-03 NOTE — ED Provider Notes (Signed)
CSN: [id number]     Arrival date & time 10/03/14  1351 History  This chart was scribed for non-physician practitioner, Domenic Moras, PA-C working with Ezequiel Essex, MD by Frederich Balding, ED scribe. This patient was seen in room TR05C/TR05C and the patient's care was started at 2:19 PM.   Chief Complaint  Patient presents with  . Back Pain   The history is provided by the patient. No language interpreter was used.    HPI Comments: Steven Hale is a 58 y.o. male who presents to the Emergency Department complaining of chronic lower back pain that worsened 3 days ago. Pain radiates down his right leg to his toes. Ambulation worsens pain. States he has been referred to Dr. Lanetta Inch with pain management but states he has to wait for approval before he can be seen. He has taken percocet and a muscle relaxer with little relief. The last time he saw his orthopedist, Dr. Rolena Infante, was a few weeks ago. Denies fever, bowel or bladder incontinence, rash. Denies history of IV drug use. Reports history of pancreatic cancer in 2009 that he is now in remission for.   Past Medical History  Diagnosis Date  . PAD (peripheral artery disease)     a. s/p bilat SFA stents;  b. ABIs 11/2012: R 0.99, L 0.86.  [name] Hypertension   . Hyperlipidemia   . Anemia, unspecified   . GERD (gastroesophageal reflux disease)   . Helicobacter pylori (H. pylori) infection   . Hemorrhoids   . Impotence of organic origin   . Bell's palsy   . CAD (coronary artery disease)     a. s/p multiple PCIs;  b. s/p CABG in 09/2010 (LIMA-LAD, SVG-OM1, SVG-distal RCA/OM2);  c.  Myoview 05/2012: EF 54%, no ischemia, no scar.   . Blood transfusion     during treatment for Ca  . DJD (degenerative joint disease)     low back & all over   . Pancreatic cancer     surgery 2009   Past Surgical History  Procedure Laterality Date  . Pancreatic  cancer  05/10/2008    Resection of distal panrease and spleen  . Splenectomy    . Coronary artery bypass  graft  nov 2011  . Back surgery      1992  . Cardiac catheterization    . Hemorrhoid surgery  10/30/2011    Procedure: HEMORRHOIDECTOMY;  Surgeon: Harl Numa Schroeter, MD;  Location: [facility] OR;  Service: General;  Laterality: N/A;   Family History  Problem Relation Age of Onset  . Heart attack Father     died of MI at age 32  . Anesthesia problems Neg Hx   . Hypotension Neg Hx   . Malignant hyperthermia Neg Hx   . Pseudochol deficiency Neg Hx   . Colon cancer Neg Hx   . Colon polyps Neg Hx   . Diabetes Neg Hx   . Kidney disease Neg Hx   . Esophageal cancer Neg Hx    History  Substance Use Topics  . Smoking status: Former Smoker    Types: Cigarettes    Quit date: 11/29/2008  . Smokeless tobacco: Never Used  . Alcohol Use: 1.2 oz/week    2 Glasses of wine per week     Comment: everyday    Review of Systems  Constitutional: Negative for fever.  Genitourinary:       Negative for bowel or bladder incontinence.  Musculoskeletal: Positive for back pain.  Skin: Negative for rash.  All other systems reviewed and are negative.  Allergies  Review of patient's allergies indicates no known allergies.  Home Medications   Prior to Admission medications   Medication Sig Start Date End Date Taking? Authorizing Provider  allopurinol (ZYLOPRIM) 100 MG tablet  09/14/14   Historical Provider, MD  aspirin EC 81 MG tablet Take 81 mg by mouth daily.      Historical Provider, MD  atorvastatin (LIPITOR) 40 MG tablet Take 0.5 tablets (20 mg total) by mouth daily. 05/31/14   Lelon Perla, MD  celecoxib (CELEBREX) 100 MG capsule Take 100 mg by mouth 2 (two) times daily.    Historical Provider, MD  clopidogrel (PLAVIX) 75 MG tablet Take 1 tablet (75 mg total) by mouth daily. 05/16/14   Wellington Hampshire, MD  CYANOCOBALAMIN PO Vitamin B complex with B12 09/24/14   Historical Provider, MD  cyclobenzaprine (FLEXERIL) 5 MG tablet Take 5 mg by mouth 2 (two) times daily as needed for muscle spasms.     Historical Provider, MD  diclofenac (VOLTAREN) 75 MG EC tablet Take 75 mg by mouth 2 (two) times daily.    Historical Provider, MD  fish oil-omega-3 fatty acids 1000 MG capsule Take 1 capsule (1 g total) by mouth daily. 11/30/12   Rosana Hoes, MD  FLUVIRIN 0.5 ML SUSY  07/28/14   Historical Provider, MD  gabapentin (NEURONTIN) 300 MG capsule Take 600 mg by mouth 3 (three) times daily.     Historical Provider, MD  lisinopril (PRINIVIL,ZESTRIL) 5 MG tablet TAKE 1/2 TABLET BY MOUTH DAILY 09/06/14   Lelon Perla, MD  meclizine (ANTIVERT) 12.5 MG tablet Take 1 tablet (12.5 mg total) by mouth 3 (three) times daily as needed for dizziness (also available over the counter). 08/30/13   Ripudeep Krystal Eaton, MD  metoprolol tartrate (LOPRESSOR) 25 MG tablet Take 25 mg by mouth 2 (two) times daily.    Historical Provider, MD  Multiple Vitamin (MULTIVITAMIN) tablet Take 1 tablet by mouth daily. 11/30/12   Rosana Hoes, MD  Naphazoline-Polyethyl Glycol 0.012-0.2 % SOLN  09/24/14   Historical Provider, MD  nitroGLYCERIN (NITROSTAT) 0.4 MG SL tablet Place 1 tablet (0.4 mg total) under the tongue every 5 (five) minutes as needed for chest pain. 11/30/12   Rosana Hoes, MD  oxyCODONE-acetaminophen (PERCOCET) 7.5-325 MG per tablet  09/20/14   Historical Provider, MD  Propylene Glycol (SYSTANE BALANCE) 0.6 % SOLN Place 1 drop into both eyes daily.    Historical Provider, MD  tadalafil (CIALIS) 20 MG tablet Take 0.5-1 tablets (10-20 mg total) by mouth every other day as needed for erectile dysfunction. 09/13/13   Ripudeep Krystal Eaton, MD  tapentadol (NUCYNTA) 50 MG TABS tablet Take 50 mg by mouth every 4 (four) hours as needed for severe pain.    Historical Provider, MD   BP 143/92 mmHg  Pulse 100  Temp(Src) 98 F (36.7 C) (Oral)  Resp 18  Ht 5\' 9"  (1.753 m)  Wt 220 lb (99.791 kg)  BMI 32.47 kg/m2  SpO2 100%   Physical Exam  Constitutional: He is oriented to person, place, and time. He appears well-developed and well-nourished.  No distress.  HENT:  Head: Normocephalic and atraumatic.  Eyes: Conjunctivae and EOM are normal.  Neck: Neck supple. No tracheal deviation present.  Cardiovascular: Normal rate.   Pulmonary/Chest: Effort normal. No respiratory distress.  Musculoskeletal: Normal range of motion.  Right para lumbar spine tenderness. Negative straight leg raise.  Neurological: He is alert and oriented to  person, place, and time.  Mild antalgic gait.  Skin: Skin is warm and dry.  Psychiatric: He has a normal mood and affect. His behavior is normal.  Nursing note and vitals reviewed.   ED Course  Procedures (including critical care time)  DIAGNOSTIC STUDIES: Oxygen Saturation is 100% on RA, normal by my interpretation.    COORDINATION OF CARE: 2:23 PM-Discussed treatment plan which includes pain medication in the ED with pt at bedside and pt agreed to plan. Advised pt that the ED does not prescribe narcotic pain medications to go home with for chronic pain.  Labs Review Labs Reviewed - No data to display  Imaging Review No results found.   EKG Interpretation None      MDM   Final diagnoses:  Chronic back pain    BP 143/92 mmHg  Pulse 100  Temp(Src) 98 F (36.7 C) (Oral)  Resp 18  Ht 5\' 9"  (1.753 m)  Wt 220 lb (99.791 kg)  BMI 32.47 kg/m2  SpO2 100%   I personally performed the services described in this documentation, which was scribed in my presence. The recorded information has been reviewed and is accurate.  Domenic Moras, PA-C 10/03/14 [place], MD 10/03/14 [phone]

## 2014-10-03 NOTE — ED Notes (Signed)
Pt reports hx of lower back pain, became more severe over the weekend and now unable to sleep due to pain. Ambulatory at triage.

## 2014-10-03 NOTE — Telephone Encounter (Signed)
[place] for surgery; DC plavix 7 days prior to procedures. Kirk Ruths

## 2014-10-03 NOTE — ED Notes (Signed)
Patient has been referred to Dr. Lanetta Inch with pain management.

## 2014-10-03 NOTE — Telephone Encounter (Signed)
Left message for Steven Hale to call

## 2014-10-03 NOTE — Telephone Encounter (Signed)
Will forward for dr crenshaw review  

## 2014-10-03 NOTE — Telephone Encounter (Signed)
L/M for patient that it is ok for him to hold plavix 7 days before procedure

## 2014-10-03 NOTE — Telephone Encounter (Signed)
New Message         Case worker calling, needs to know if pt can have knee replacement and epidural steroid injection into his lumbar spine. How long does he have to wait for plavics? Please call back and advise or fax info to Fax number [phone]

## 2014-10-04 ENCOUNTER — Telehealth: Payer: Self-pay | Admitting: Cardiology

## 2014-10-04 NOTE — Telephone Encounter (Signed)
Will fax this note to the number provided. 

## 2014-10-04 NOTE — Telephone Encounter (Signed)
Pt is calling in to see if Debra faxed over a clearance paperwork to his doctors office for him to stop plavix. Please call  Thanks

## 2014-10-04 NOTE — Telephone Encounter (Signed)
Spoke with pt, aware paperwork was faxed this morning at 10:15am.

## 2014-10-05 ENCOUNTER — Encounter (HOSPITAL_COMMUNITY): Payer: Self-pay | Admitting: Emergency Medicine

## 2014-10-05 ENCOUNTER — Emergency Department (HOSPITAL_COMMUNITY)
Admission: EM | Admit: 2014-10-05 | Discharge: 2014-10-05 | Disposition: A | Discharge status: Home / Self Care | Payer: Worker's Compensation | Attending: Emergency Medicine | Admitting: Emergency Medicine

## 2014-10-05 ENCOUNTER — Telehealth: Payer: Self-pay | Admitting: Gynecology

## 2014-10-05 ENCOUNTER — Encounter: Payer: Self-pay | Admitting: Cardiology

## 2014-10-05 DIAGNOSIS — G8929 Other chronic pain: Secondary | ICD-10-CM | POA: Diagnosis not present

## 2014-10-05 DIAGNOSIS — Z7902 Long term (current) use of antithrombotics/antiplatelets: Secondary | ICD-10-CM | POA: Diagnosis not present

## 2014-10-05 DIAGNOSIS — Z8619 Personal history of other infectious and parasitic diseases: Secondary | ICD-10-CM | POA: Diagnosis not present

## 2014-10-05 DIAGNOSIS — Z79899 Other long term (current) drug therapy: Secondary | ICD-10-CM | POA: Insufficient documentation

## 2014-10-05 DIAGNOSIS — Z87448 Personal history of other diseases of urinary system: Secondary | ICD-10-CM | POA: Insufficient documentation

## 2014-10-05 DIAGNOSIS — E785 Hyperlipidemia, unspecified: Secondary | ICD-10-CM | POA: Diagnosis not present

## 2014-10-05 DIAGNOSIS — Z8507 Personal history of malignant neoplasm of pancreas: Secondary | ICD-10-CM | POA: Insufficient documentation

## 2014-10-05 DIAGNOSIS — Z951 Presence of aortocoronary bypass graft: Secondary | ICD-10-CM | POA: Diagnosis not present

## 2014-10-05 DIAGNOSIS — M1990 Unspecified osteoarthritis, unspecified site: Secondary | ICD-10-CM | POA: Diagnosis not present

## 2014-10-05 DIAGNOSIS — Z791 Long term (current) use of non-steroidal anti-inflammatories (NSAID): Secondary | ICD-10-CM | POA: Insufficient documentation

## 2014-10-05 DIAGNOSIS — Z8719 Personal history of other diseases of the digestive system: Secondary | ICD-10-CM | POA: Diagnosis not present

## 2014-10-05 DIAGNOSIS — I2510 Atherosclerotic heart disease of native coronary artery without angina pectoris: Secondary | ICD-10-CM | POA: Insufficient documentation

## 2014-10-05 DIAGNOSIS — I10 Essential (primary) hypertension: Secondary | ICD-10-CM | POA: Insufficient documentation

## 2014-10-05 DIAGNOSIS — M545 Low back pain, unspecified: Secondary | ICD-10-CM

## 2014-10-05 DIAGNOSIS — Z87891 Personal history of nicotine dependence: Secondary | ICD-10-CM | POA: Diagnosis not present

## 2014-10-05 DIAGNOSIS — D649 Anemia, unspecified: Secondary | ICD-10-CM | POA: Insufficient documentation

## 2014-10-05 MED ORDER — DEXAMETHASONE SODIUM PHOSPHATE 10 MG/ML IJ SOLN
10.0000 mg | Freq: Once | INTRAMUSCULAR | Status: AC
  Administered 2014-10-05: 10 mg via INTRAMUSCULAR
  Filled 2014-10-05: qty 1

## 2014-10-05 MED ORDER — HYDROMORPHONE HCL 1 MG/ML IJ SOLN
1.0000 mg | Freq: Once | INTRAMUSCULAR | Status: AC
  Administered 2014-10-05: 1 mg via INTRAMUSCULAR
  Filled 2014-10-05: qty 1

## 2014-10-05 NOTE — Telephone Encounter (Signed)
Put wrong doctor on encounrer by mistake,no message was taken.

## 2014-10-05 NOTE — Telephone Encounter (Signed)
Pt says he needs you to fax the paperwork to this number-Fax:-[phone].He said they said they did not received what was faxed yesterday.

## 2014-10-05 NOTE — Telephone Encounter (Signed)
Pt called in wanting Hilda Blades to disregard the last message

## 2014-10-05 NOTE — ED Notes (Signed)
Pt reports chronic lower back pain that began to flare-up a week and a half ago. Radiation down R leg. Pt ambulatory to room

## 2014-10-05 NOTE — Discharge Instructions (Signed)
Followup with orthopedics if symptoms continue. Use conservative methods at home including heat therapy and cold therapy as we discussed. More information on cold therapy is listed below.  It is not recommended to use heat treatment directly after an acute injury.  SEEK IMMEDIATE MEDICAL ATTENTION IF: New numbness, tingling, weakness, or problem with the use of your arms or legs.  Severe back pain not relieved with medications.  Change in bowel or bladder control.  Increasing pain in any areas of the body (such as chest or abdominal pain).  Shortness of breath, dizziness or fainting.  Nausea (feeling sick to your stomach), vomiting, fever, or sweats.  COLD THERAPY DIRECTIONS:  Ice or gel packs can be used to reduce both pain and swelling. Ice is the most helpful within the first 24 to 48 hours after an injury or flareup from overusing a muscle or joint.  Ice is effective, has very few side effects, and is safe for most people to use.   If you expose your skin to cold temperatures for too long or without the proper protection, you can damage your skin or nerves. Watch for signs of skin damage due to cold.   HOME CARE INSTRUCTIONS  Follow these tips to use ice and cold packs safely.  Place a dry or damp towel between the ice and skin. A damp towel will cool the skin more quickly, so you may need to shorten the time that the ice is used.  For a more rapid response, add gentle compression to the ice.  Ice for no more than 10 to 20 minutes at a time. The bonier the area you are icing, the less time it will take to get the benefits of ice.  Check your skin after 5 minutes to make sure there are no signs of a poor response to cold or skin damage.  Rest 20 minutes or more in between uses.  Once your skin is numb, you can end your treatment. You can test numbness by very lightly touching your skin. The touch should be so light that you do not see the skin dimple from the pressure of your fingertip. When  using ice, most people will feel these normal sensations in this order: cold, burning, aching, and numbness.  Do not use ice on someone who cannot communicate their responses to pain, such as small children or people with dementia.   HOW TO MAKE AN ICE PACK  To make an ice pack, do one of the following:  Place crushed ice or a bag of frozen vegetables in a sealable plastic bag. Squeeze out the excess air. Place this bag inside another plastic bag. Slide the bag into a pillowcase or place a damp towel between your skin and the bag.  Mix 3 parts water with 1 part rubbing alcohol. Freeze the mixture in a sealable plastic bag. When you remove the mixture from the freezer, it will be slushy. Squeeze out the excess air. Place this bag inside another plastic bag. Slide the bag into a pillowcase or place a damp towel between your skin and the bag.   SEEK MEDICAL CARE IF:  You develop white spots on your skin. This may give the skin a blotchy (mottled) appearance.  Your skin turns blue or pale.  Your skin becomes waxy or hard.  Your swelling gets worse.  MAKE SURE YOU:  Understand these instructions.  Will watch your condition.  Will get help right away if you are not doing well or  get worse.    Chronic Pain Discharge Instructions  Emergency care providers appreciate that many patients coming to [place] are in severe pain and we wish to address their pain in the safest, most responsible manner.  It is important to recognize however, that the proper treatment of chronic pain differs from that of the pain of injuries and acute illnesses.  Our goal is to provide quality, safe, personalized care and we thank you for giving [place] the opportunity to serve you. The use of narcotics and related agents for chronic pain syndromes may lead to additional physical and psychological problems.  Nearly as many people die from prescription narcotics each year as die from car crashes.  Additionally, this risk is increased if such  prescriptions are obtained from a variety of sources.  Therefore, only your primary care physician or a pain management specialist is able to safely treat such syndromes with narcotic medications long-term.    Documentation revealing such prescriptions have been sought from multiple sources may prohibit [place] from providing a refill or different narcotic medication.  Your name may be checked first through the [facility].  This database is a record of controlled substance medication prescriptions that the patient has received.  This has been established by [facility] in an effort to eliminate the dangerous, and often life threatening, practice of obtaining multiple prescriptions from different medical providers.   If you have a chronic pain syndrome (i.e. chronic headaches, recurrent back or neck pain, dental pain, abdominal or pelvis pain without a specific diagnosis, or neuropathic pain such as fibromyalgia) or recurrent visits for the same condition without an acute diagnosis, you may be treated with non-narcotics and other non-addictive medicines.  Allergic reactions or negative side effects that may be reported by a patient to such medications will not typically lead to the use of a narcotic analgesic or other controlled substance as an alternative.   Patients managing chronic pain with a personal physician should have provisions in place for breakthrough pain.  If you are in crisis, you should call your physician.  If your physician directs you to the emergency department, please have the doctor call and speak to our attending physician concerning your care.   When patients come to the Emergency Department (ED) with acute medical conditions in which the Emergency Department physician feels appropriate to prescribe narcotic or sedating pain medication, the physician will prescribe these in very limited quantities.  The amount of these medications will last only  until you can see your primary care physician in his/her office.  Any patient who returns to the ED seeking refills should expect only non-narcotic pain medications.   In the event of an acute medical condition exists and the emergency physician feels it is necessary that the patient be given a narcotic or sedating medication -  a responsible adult driver should be present in the room prior to the medication being given by the nurse.   Prescriptions for narcotic or sedating medications that have been lost, stolen or expired will not be refilled in the Emergency Department.    Patients who have chronic pain may receive non-narcotic prescriptions until seen by their primary care physician.  It is every patients personal responsibility to maintain active prescriptions with his or her primary care physician or specialist.

## 2014-10-05 NOTE — Telephone Encounter (Signed)
This encounter was created in error - please disregard.

## 2014-10-05 NOTE — ED Provider Notes (Signed)
CSN: [id number]     Arrival date & time 10/05/14  1046 History   First MD Initiated Contact with Patient 10/05/14 1058     Chief Complaint  Patient presents with  . Back Pain     (Consider location/radiation/quality/duration/timing/severity/associated sxs/prior Treatment) HPI Comments: Patient with a history of chronic lower back pain presents today with back pain.  He reports that the pain located across lower back and radiates down right leg.  He reports that the pain has been present for years, but worsened over the past 1.5 weeks.  No new injury or trauma.    He reports intermittent tingling of the right leg, but denies numbness.  He denies bowel/bladder incontinence, fever, or chills.  No abdominal pain.  He reports that he has taken Percocet 7.5 mg/325 mg for the pain without relief.  He reports that he has been seen by Neurosurgery and Orthopedics for this pain in the past, but has opted to not have surgery.    Patient is a 58 y.o. male presenting with back pain. The history is provided by the patient.  Back Pain   Past Medical History  Diagnosis Date  . PAD (peripheral artery disease)     a. s/p bilat SFA stents;  b. ABIs 11/2012: R 0.99, L 0.86.  [name] Hypertension   . Hyperlipidemia   . Anemia, unspecified   . GERD (gastroesophageal reflux disease)   . Helicobacter pylori (H. pylori) infection   . Hemorrhoids   . Impotence of organic origin   . Bell's palsy   . CAD (coronary artery disease)     a. s/p multiple PCIs;  b. s/p CABG in 09/2010 (LIMA-LAD, SVG-OM1, SVG-distal RCA/OM2);  c.  Myoview 05/2012: EF 54%, no ischemia, no scar.   . Blood transfusion     during treatment for Ca  . DJD (degenerative joint disease)     low back & all over   . Pancreatic cancer     surgery 2009   Past Surgical History  Procedure Laterality Date  . Pancreatic  cancer  05/10/2008    Resection of distal panrease and spleen  . Splenectomy    . Coronary artery bypass graft  nov 2011  . Back  surgery      1992  . Cardiac catheterization    . Hemorrhoid surgery  10/30/2011    Procedure: HEMORRHOIDECTOMY;  Surgeon: Harl Bowie, MD;  Location: [facility] OR;  Service: General;  Laterality: N/A;   Family History  Problem Relation Age of Onset  . Heart attack Father     died of MI at age 22  . Anesthesia problems Neg Hx   . Hypotension Neg Hx   . Malignant hyperthermia Neg Hx   . Pseudochol deficiency Neg Hx   . Colon cancer Neg Hx   . Colon polyps Neg Hx   . Diabetes Neg Hx   . Kidney disease Neg Hx   . Esophageal cancer Neg Hx    History  Substance Use Topics  . Smoking status: Former Smoker    Types: Cigarettes    Quit date: 11/29/2008  . Smokeless tobacco: Never Used  . Alcohol Use: 1.2 oz/week    2 Glasses of wine per week     Comment: everyday    Review of Systems  Musculoskeletal: Positive for back pain.  All other systems reviewed and are negative.     Allergies  Review of patient's allergies indicates no known allergies.  Home Medications   Prior  to Admission medications   Medication Sig Start Date End Date Taking? Authorizing Provider  allopurinol (ZYLOPRIM) 100 MG tablet  09/14/14   Historical Provider, MD  aspirin EC 81 MG tablet Take 81 mg by mouth daily.      Historical Provider, MD  atorvastatin (LIPITOR) 40 MG tablet Take 0.5 tablets (20 mg total) by mouth daily. 05/31/14   Lelon Perla, MD  celecoxib (CELEBREX) 100 MG capsule Take 100 mg by mouth 2 (two) times daily.    Historical Provider, MD  clopidogrel (PLAVIX) 75 MG tablet Take 1 tablet (75 mg total) by mouth daily. 05/16/14   Wellington Hampshire, MD  CYANOCOBALAMIN PO Vitamin B complex with B12 09/24/14   Historical Provider, MD  cyclobenzaprine (FLEXERIL) 5 MG tablet Take 5 mg by mouth 2 (two) times daily as needed for muscle spasms.    Historical Provider, MD  diclofenac (VOLTAREN) 75 MG EC tablet Take 75 mg by mouth 2 (two) times daily.    Historical Provider, MD  fish oil-omega-3  fatty acids 1000 MG capsule Take 1 capsule (1 g total) by mouth daily. 11/30/12   Rosana Hoes, MD  FLUVIRIN 0.5 ML SUSY  07/28/14   Historical Provider, MD  gabapentin (NEURONTIN) 300 MG capsule Take 600 mg by mouth 3 (three) times daily.     Historical Provider, MD  lisinopril (PRINIVIL,ZESTRIL) 5 MG tablet TAKE 1/2 TABLET BY MOUTH DAILY 09/06/14   Lelon Perla, MD  meclizine (ANTIVERT) 12.5 MG tablet Take 1 tablet (12.5 mg total) by mouth 3 (three) times daily as needed for dizziness (also available over the counter). 08/30/13   Ripudeep Krystal Eaton, MD  metoprolol tartrate (LOPRESSOR) 25 MG tablet Take 25 mg by mouth 2 (two) times daily.    Historical Provider, MD  Multiple Vitamin (MULTIVITAMIN) tablet Take 1 tablet by mouth daily. 11/30/12   Rosana Hoes, MD  Naphazoline-Polyethyl Glycol 0.012-0.2 % SOLN  09/24/14   Historical Provider, MD  nitroGLYCERIN (NITROSTAT) 0.4 MG SL tablet Place 1 tablet (0.4 mg total) under the tongue every 5 (five) minutes as needed for chest pain. 11/30/12   Rosana Hoes, MD  oxyCODONE-acetaminophen (PERCOCET) 7.5-325 MG per tablet  09/20/14   Historical Provider, MD  Propylene Glycol (SYSTANE BALANCE) 0.6 % SOLN Place 1 drop into both eyes daily.    Historical Provider, MD  tadalafil (CIALIS) 20 MG tablet Take 0.5-1 tablets (10-20 mg total) by mouth every other day as needed for erectile dysfunction. 09/13/13   Ripudeep Krystal Eaton, MD  tapentadol (NUCYNTA) 50 MG TABS tablet Take 50 mg by mouth every 4 (four) hours as needed for severe pain.    Historical Provider, MD   BP 128/81 mmHg  Pulse 75  Temp(Src) 97.8 F (36.6 C) (Oral)  Resp 16  SpO2 95% Physical Exam  Constitutional: He is oriented to person, place, and time. He appears well-developed and well-nourished. No distress.  HENT:  Head: Normocephalic and atraumatic.  Eyes: EOM are normal. No scleral icterus.  Neck: Normal range of motion and full passive range of motion without pain. Neck supple. No spinous process  tenderness and no muscular tenderness present. Normal range of motion present.  Cardiovascular: Normal rate, regular rhythm and intact distal pulses.  Exam reveals no gallop and no friction rub.   No murmur heard. Pulmonary/Chest: Effort normal and breath sounds normal. No respiratory distress. He has no wheezes. He has no rales. He exhibits no tenderness.  Musculoskeletal:       Cervical  back: He exhibits normal range of motion, no tenderness, no bony tenderness and no pain.       Thoracic back: He exhibits no tenderness, no bony tenderness and no pain.       Lumbar back: He exhibits no tenderness, no bony tenderness, no spasm and normal pulse.       Right foot: There is no swelling.       Left foot: There is no swelling.  Bilateral lower extremities nontender without color change, baseline range of motion of extremities with intact distal pulses.  Pt has increased pain w ROM of lumbar spine. Pain w ambulation, no sign of ataxia.  Neurological: He is alert and oriented to person, place, and time. He has normal strength and normal reflexes. No sensory deficit. Gait (no ataxia, slowed and hunched d/t pain ) abnormal.  Sensation at baseline for light touch in all 4 distal extremities, motor symmetric & bilateral 5/5 (hips: abduction, adduction, flexion; knee: flexion & extension; foot: dorsiflexion, plantar flexion, toes: dorsi flexion) Patellar & ankle reflexes intact.   Skin: Skin is warm and dry. No rash noted. He is not diaphoretic. No erythema.  Psychiatric: He has a normal mood and affect.  Nursing note and vitals reviewed.   ED Course  Procedures (including critical care time) Labs Review Labs Reviewed - No data to display  Imaging Review No results found.   EKG Interpretation None     11:49 AM Reassessed patient.  He reports that his pain feels "a whole lot better." MDM   Final diagnoses:  None   Patient with back pain.  No neurological deficits and normal neuro exam.   Patient can walk but states is painful.  No loss of bowel or bladder control.  No concern for cauda equina.  No fever, night sweats, weight loss,  IVDU.  RICE protocol and pain medicine indicated and discussed with patient. Patient currently has Percocet 7.5/325 mg and a muscle relaxer at home.  Therefore, not given any prescriptions for pain medications.  Patient instructed to follow up with PCP and Dr. Rolena Infante.  Patient stable for discharge.  Return precautions given.       Hyman Bible, PA-C 10/05/14 1347  Quintella Reichert, MD 10/05/14 1535

## 2014-10-09 ENCOUNTER — Emergency Department (HOSPITAL_COMMUNITY)
Admission: EM | Admit: 2014-10-09 | Discharge: 2014-10-09 | Disposition: A | Discharge status: Home / Self Care | Payer: Commercial Managed Care - HMO | Attending: Emergency Medicine | Admitting: Emergency Medicine

## 2014-10-09 ENCOUNTER — Encounter (HOSPITAL_COMMUNITY): Payer: Self-pay | Admitting: *Deleted

## 2014-10-09 DIAGNOSIS — I2510 Atherosclerotic heart disease of native coronary artery without angina pectoris: Secondary | ICD-10-CM | POA: Insufficient documentation

## 2014-10-09 DIAGNOSIS — Z87438 Personal history of other diseases of male genital organs: Secondary | ICD-10-CM | POA: Insufficient documentation

## 2014-10-09 DIAGNOSIS — I10 Essential (primary) hypertension: Secondary | ICD-10-CM | POA: Insufficient documentation

## 2014-10-09 DIAGNOSIS — Z8719 Personal history of other diseases of the digestive system: Secondary | ICD-10-CM | POA: Insufficient documentation

## 2014-10-09 DIAGNOSIS — Z8507 Personal history of malignant neoplasm of pancreas: Secondary | ICD-10-CM | POA: Insufficient documentation

## 2014-10-09 DIAGNOSIS — Z79899 Other long term (current) drug therapy: Secondary | ICD-10-CM | POA: Diagnosis not present

## 2014-10-09 DIAGNOSIS — Z8619 Personal history of other infectious and parasitic diseases: Secondary | ICD-10-CM | POA: Diagnosis not present

## 2014-10-09 DIAGNOSIS — G8929 Other chronic pain: Secondary | ICD-10-CM | POA: Insufficient documentation

## 2014-10-09 DIAGNOSIS — Z7902 Long term (current) use of antithrombotics/antiplatelets: Secondary | ICD-10-CM | POA: Diagnosis not present

## 2014-10-09 DIAGNOSIS — E785 Hyperlipidemia, unspecified: Secondary | ICD-10-CM | POA: Diagnosis not present

## 2014-10-09 DIAGNOSIS — M545 Low back pain, unspecified: Secondary | ICD-10-CM

## 2014-10-09 DIAGNOSIS — Z7982 Long term (current) use of aspirin: Secondary | ICD-10-CM | POA: Diagnosis not present

## 2014-10-09 DIAGNOSIS — Z8739 Personal history of other diseases of the musculoskeletal system and connective tissue: Secondary | ICD-10-CM | POA: Diagnosis not present

## 2014-10-09 DIAGNOSIS — Z791 Long term (current) use of non-steroidal anti-inflammatories (NSAID): Secondary | ICD-10-CM | POA: Diagnosis not present

## 2014-10-09 DIAGNOSIS — Z951 Presence of aortocoronary bypass graft: Secondary | ICD-10-CM | POA: Insufficient documentation

## 2014-10-09 DIAGNOSIS — Z9889 Other specified postprocedural states: Secondary | ICD-10-CM | POA: Insufficient documentation

## 2014-10-09 DIAGNOSIS — Z87891 Personal history of nicotine dependence: Secondary | ICD-10-CM | POA: Diagnosis not present

## 2014-10-09 DIAGNOSIS — D649 Anemia, unspecified: Secondary | ICD-10-CM | POA: Diagnosis not present

## 2014-10-09 MED ORDER — HYDROMORPHONE HCL 2 MG/ML IJ SOLN
2.0000 mg | Freq: Once | INTRAMUSCULAR | Status: AC
  Administered 2014-10-09: 2 mg via INTRAMUSCULAR
  Filled 2014-10-09: qty 1

## 2014-10-09 NOTE — Discharge Instructions (Signed)

## 2014-10-09 NOTE — ED Notes (Signed)
Pt reports low back pain radiating to his R leg.  Pt reports hx of chronic back pain d/t degenerative disc.  Pt reports pain became worse early in the am yesterday.

## 2014-10-09 NOTE — ED Notes (Signed)
Pt states there is something "probbing" in his back, pt on all 4's on floor.

## 2014-10-09 NOTE — ED Provider Notes (Signed)
CSN: [id number]     Arrival date & time 10/09/14  0041 History   First MD Initiated Contact with Patient 10/09/14 0142     Chief Complaint  Patient presents with  . Back Pain     (Consider location/radiation/quality/duration/timing/severity/associated sxs/prior Treatment) Patient is a 58 y.o. male presenting with back pain. The history is provided by the patient. No language interpreter was used.  Back Pain Location:  Lumbar spine Associated symptoms: no fever   Associated symptoms comment:  Chronic low back pain in a patient managed by Pain management who returns to the emergency department for uncontrolled pain. No new symptoms. He is currently taking Percocet and muscle relaxers, with periodic injections into the back through neurosurgery, per patient.    Past Medical History  Diagnosis Date  . PAD (peripheral artery disease)     a. s/p bilat SFA stents;  b. ABIs 11/2012: R 0.99, L 0.86.  [name] Hypertension   . Hyperlipidemia   . Anemia, unspecified   . GERD (gastroesophageal reflux disease)   . Helicobacter pylori (H. pylori) infection   . Hemorrhoids   . Impotence of organic origin   . Bell's palsy   . CAD (coronary artery disease)     a. s/p multiple PCIs;  b. s/p CABG in 09/2010 (LIMA-LAD, SVG-OM1, SVG-distal RCA/OM2);  c.  Myoview 05/2012: EF 54%, no ischemia, no scar.   . Blood transfusion     during treatment for Ca  . DJD (degenerative joint disease)     low back & all over   . Pancreatic cancer     surgery 2009   Past Surgical History  Procedure Laterality Date  . Pancreatic  cancer  05/10/2008    Resection of distal panrease and spleen  . Splenectomy    . Coronary artery bypass graft  nov 2011  . Back surgery      1992  . Cardiac catheterization    . Hemorrhoid surgery  10/30/2011    Procedure: HEMORRHOIDECTOMY;  Surgeon: Harl Bowie, MD;  Location: [facility] OR;  Service: General;  Laterality: N/A;   Family History  Problem Relation Age of Onset  . Heart  attack Father     died of MI at age 1  . Anesthesia problems Neg Hx   . Hypotension Neg Hx   . Malignant hyperthermia Neg Hx   . Pseudochol deficiency Neg Hx   . Colon cancer Neg Hx   . Colon polyps Neg Hx   . Diabetes Neg Hx   . Kidney disease Neg Hx   . Esophageal cancer Neg Hx    History  Substance Use Topics  . Smoking status: Former Smoker    Types: Cigarettes    Quit date: 11/29/2008  . Smokeless tobacco: Never Used  . Alcohol Use: 1.2 oz/week    2 Glasses of wine per week     Comment: everyday    Review of Systems  Constitutional: Negative for fever and chills.  Respiratory: Negative.   Cardiovascular: Negative.   Gastrointestinal: Negative.   Genitourinary: Negative.  Negative for enuresis.  Musculoskeletal: Positive for back pain.       See HPI  Skin: Negative.   Neurological: Negative.       Allergies  Review of patient's allergies indicates no known allergies.  Home Medications   Prior to Admission medications   Medication Sig Start Date End Date Taking? Authorizing Provider  allopurinol (ZYLOPRIM) 100 MG tablet Take 100 mg by mouth daily.  09/14/14  Yes Historical Provider, MD  aspirin EC 81 MG tablet Take 81 mg by mouth daily.     Yes Historical Provider, MD  atorvastatin (LIPITOR) 40 MG tablet Take 0.5 tablets (20 mg total) by mouth daily. 05/31/14  Yes Lelon Perla, MD  clopidogrel (PLAVIX) 75 MG tablet Take 1 tablet (75 mg total) by mouth daily. 05/16/14  Yes Wellington Hampshire, MD  cyclobenzaprine (FLEXERIL) 5 MG tablet Take 5 mg by mouth daily.    Yes Historical Provider, MD  diclofenac (VOLTAREN) 75 MG EC tablet Take 75 mg by mouth 2 (two) times daily.   Yes Historical Provider, MD  fish oil-omega-3 fatty acids 1000 MG capsule Take 1 capsule (1 g total) by mouth daily. 11/30/12  Yes Rosana Hoes, MD  gabapentin (NEURONTIN) 300 MG capsule Take 600 mg by mouth 3 (three) times daily.    Yes Historical Provider, MD  lisinopril (PRINIVIL,ZESTRIL) 5 MG  tablet Take 2.5 mg by mouth daily.   Yes Historical Provider, MD  meclizine (ANTIVERT) 12.5 MG tablet Take 1 tablet (12.5 mg total) by mouth 3 (three) times daily as needed for dizziness (also available over the counter). 08/30/13  Yes Ripudeep Krystal Eaton, MD  metoprolol tartrate (LOPRESSOR) 25 MG tablet Take 25 mg by mouth 2 (two) times daily.   Yes Historical Provider, MD  Multiple Vitamin (MULTIVITAMIN) tablet Take 1 tablet by mouth daily. 11/30/12  Yes Rosana Hoes, MD  nitroGLYCERIN (NITROSTAT) 0.4 MG SL tablet Place 1 tablet (0.4 mg total) under the tongue every 5 (five) minutes as needed for chest pain. 11/30/12  Yes Rosana Hoes, MD  oxyCODONE-acetaminophen (PERCOCET) 7.5-325 MG per tablet Take 1 tablet by mouth every 6 (six) hours as needed for pain (pain).  09/20/14  Yes Historical Provider, MD  Propylene Glycol (SYSTANE BALANCE) 0.6 % SOLN Place 1 drop into both eyes daily.   Yes Historical Provider, MD  tapentadol (NUCYNTA) 50 MG TABS tablet Take 50 mg by mouth every 4 (four) hours as needed for severe pain (pain).    Yes Historical Provider, MD  vitamin B-12 (CYANOCOBALAMIN) 100 MCG tablet Take 100 mcg by mouth daily.   Yes Historical Provider, MD  lisinopril (PRINIVIL,ZESTRIL) 5 MG tablet TAKE 1/2 TABLET BY MOUTH DAILY Patient not taking: Reported on 10/09/2014 09/06/14   Lelon Perla, MD  tadalafil (CIALIS) 20 MG tablet Take 0.5-1 tablets (10-20 mg total) by mouth every other day as needed for erectile dysfunction. 09/13/13   Ripudeep K Rai, MD   BP 136/91 mmHg  Pulse 82  Temp(Src) 98.3 F (36.8 C) (Oral)  Resp 16  SpO2 95% Physical Exam  Constitutional: He is oriented to person, place, and time. He appears well-developed and well-nourished.  Neck: Normal range of motion.  Pulmonary/Chest: Effort normal.  Abdominal: Soft. He exhibits no mass. There is no tenderness.  Musculoskeletal: Normal range of motion.  left paralumbar tenderness without swelling, discoloration. No sciatic  tenderness.   Neurological: He is alert and oriented to person, place, and time. He has normal reflexes. No sensory deficit.  Skin: Skin is warm and dry.  Psychiatric: He has a normal mood and affect.    ED Course  Procedures (including critical care time) Labs Review Labs Reviewed - No data to display  Imaging Review No results found.   EKG Interpretation None      MDM   Final diagnoses:  None    1. Chronic low back pain  No neurologic deficits on exam in patient with  unchanged chronic back pain. Recommended follow up with his doctor outpatient to discuss better pain management.     Dewaine Oats, PA-C 10/09/14 1423  Everlene Balls, MD 10/09/14 [phone]

## 2014-10-10 ENCOUNTER — Encounter: Payer: Self-pay | Admitting: Gastroenterology

## 2014-10-12 ENCOUNTER — Emergency Department (HOSPITAL_COMMUNITY): Payer: Medicare HMO

## 2014-10-12 ENCOUNTER — Other Ambulatory Visit: Payer: Self-pay | Admitting: Cardiology

## 2014-10-12 ENCOUNTER — Encounter: Payer: Self-pay | Admitting: Cardiology

## 2014-10-12 ENCOUNTER — Encounter (HOSPITAL_COMMUNITY): Payer: Self-pay | Admitting: *Deleted

## 2014-10-12 ENCOUNTER — Ambulatory Visit (INDEPENDENT_AMBULATORY_CARE_PROVIDER_SITE_OTHER): Payer: Medicare HMO | Admitting: Cardiology

## 2014-10-12 ENCOUNTER — Ambulatory Visit (HOSPITAL_BASED_OUTPATIENT_CLINIC_OR_DEPARTMENT_OTHER): Payer: Medicare HMO | Admitting: *Deleted

## 2014-10-12 ENCOUNTER — Emergency Department (HOSPITAL_COMMUNITY)
Admission: EM | Admit: 2014-10-12 | Discharge: 2014-10-12 | Disposition: A | Discharge status: Home / Self Care | Payer: Medicare HMO | Attending: Emergency Medicine | Admitting: Emergency Medicine

## 2014-10-12 VITALS — BP 130/88 | HR 78 | Ht 69.0 in | Wt 222.0 lb

## 2014-10-12 DIAGNOSIS — K219 Gastro-esophageal reflux disease without esophagitis: Secondary | ICD-10-CM | POA: Insufficient documentation

## 2014-10-12 DIAGNOSIS — Z79899 Other long term (current) drug therapy: Secondary | ICD-10-CM

## 2014-10-12 DIAGNOSIS — M5137 Other intervertebral disc degeneration, lumbosacral region: Secondary | ICD-10-CM | POA: Insufficient documentation

## 2014-10-12 DIAGNOSIS — Z955 Presence of coronary angioplasty implant and graft: Secondary | ICD-10-CM

## 2014-10-12 DIAGNOSIS — I739 Peripheral vascular disease, unspecified: Secondary | ICD-10-CM

## 2014-10-12 DIAGNOSIS — M79604 Pain in right leg: Secondary | ICD-10-CM

## 2014-10-12 DIAGNOSIS — Z8507 Personal history of malignant neoplasm of pancreas: Secondary | ICD-10-CM | POA: Insufficient documentation

## 2014-10-12 DIAGNOSIS — M79606 Pain in leg, unspecified: Secondary | ICD-10-CM | POA: Insufficient documentation

## 2014-10-12 DIAGNOSIS — E785 Hyperlipidemia, unspecified: Secondary | ICD-10-CM | POA: Diagnosis present

## 2014-10-12 DIAGNOSIS — M5136 Other intervertebral disc degeneration, lumbar region: Secondary | ICD-10-CM

## 2014-10-12 DIAGNOSIS — E291 Testicular hypofunction: Secondary | ICD-10-CM | POA: Diagnosis present

## 2014-10-12 DIAGNOSIS — G8929 Other chronic pain: Secondary | ICD-10-CM

## 2014-10-12 DIAGNOSIS — Z791 Long term (current) use of non-steroidal anti-inflammatories (NSAID): Secondary | ICD-10-CM | POA: Insufficient documentation

## 2014-10-12 DIAGNOSIS — I70221 Atherosclerosis of native arteries of extremities with rest pain, right leg: Secondary | ICD-10-CM

## 2014-10-12 DIAGNOSIS — Z951 Presence of aortocoronary bypass graft: Secondary | ICD-10-CM

## 2014-10-12 DIAGNOSIS — M5441 Lumbago with sciatica, right side: Secondary | ICD-10-CM | POA: Insufficient documentation

## 2014-10-12 DIAGNOSIS — Z87891 Personal history of nicotine dependence: Secondary | ICD-10-CM | POA: Insufficient documentation

## 2014-10-12 DIAGNOSIS — M5116 Intervertebral disc disorders with radiculopathy, lumbar region: Principal | ICD-10-CM | POA: Diagnosis present

## 2014-10-12 DIAGNOSIS — I2510 Atherosclerotic heart disease of native coronary artery without angina pectoris: Secondary | ICD-10-CM

## 2014-10-12 DIAGNOSIS — M79601 Pain in right arm: Secondary | ICD-10-CM

## 2014-10-12 DIAGNOSIS — Z7902 Long term (current) use of antithrombotics/antiplatelets: Secondary | ICD-10-CM | POA: Insufficient documentation

## 2014-10-12 DIAGNOSIS — I10 Essential (primary) hypertension: Secondary | ICD-10-CM | POA: Insufficient documentation

## 2014-10-12 DIAGNOSIS — M549 Dorsalgia, unspecified: Secondary | ICD-10-CM

## 2014-10-12 DIAGNOSIS — Z862 Personal history of diseases of the blood and blood-forming organs and certain disorders involving the immune mechanism: Secondary | ICD-10-CM | POA: Insufficient documentation

## 2014-10-12 DIAGNOSIS — M545 Low back pain: Secondary | ICD-10-CM | POA: Diagnosis not present

## 2014-10-12 DIAGNOSIS — Z8619 Personal history of other infectious and parasitic diseases: Secondary | ICD-10-CM | POA: Insufficient documentation

## 2014-10-12 DIAGNOSIS — Z7952 Long term (current) use of systemic steroids: Secondary | ICD-10-CM | POA: Insufficient documentation

## 2014-10-12 DIAGNOSIS — D509 Iron deficiency anemia, unspecified: Secondary | ICD-10-CM | POA: Diagnosis present

## 2014-10-12 DIAGNOSIS — Z7982 Long term (current) use of aspirin: Secondary | ICD-10-CM | POA: Insufficient documentation

## 2014-10-12 MED ORDER — OXYCODONE HCL 5 MG PO TABS
10.0000 mg | ORAL_TABLET | ORAL | Status: DC | PRN
Start: 2014-10-12 — End: 2014-10-16

## 2014-10-12 MED ORDER — MORPHINE SULFATE 4 MG/ML IJ SOLN
4.0000 mg | Freq: Once | INTRAMUSCULAR | Status: AC
  Administered 2014-10-12: 4 mg via INTRAMUSCULAR
  Filled 2014-10-12: qty 1

## 2014-10-12 MED ORDER — PREDNISONE 50 MG PO TABS: ORAL_TABLET | ORAL | Status: DC

## 2014-10-12 MED ORDER — PREDNISONE 20 MG PO TABS
60.0000 mg | ORAL_TABLET | Freq: Once | ORAL | Status: AC
  Administered 2014-10-12: 60 mg via ORAL
  Filled 2014-10-12: qty 3

## 2014-10-12 NOTE — Discharge Instructions (Signed)
Take oxycodone for breakthrough pain, do not drink alcohol, drive, care for children or do other critical tasks while taking oxycodone.  Please follow with your primary care doctor in the next 2 days for a check-up. They must obtain records for further management.   Do not hesitate to return to the Emergency Department for any new, worsening or concerning symptoms.   Back Pain, Adult Back pain is very common. The pain often gets better over time. The cause of back pain is usually not dangerous. Most people can learn to manage their back pain on their own.  HOME CARE   Stay active. Start with short walks on flat ground if you can. Try to walk farther each day.  Do not sit, drive, or stand in one place for more than 30 minutes. Do not stay in bed.  Do not avoid exercise or work. Activity can help your back heal faster.  Be careful when you bend or lift an object. Bend at your knees, keep the object close to you, and do not twist.  Sleep on a firm mattress. Lie on your side, and bend your knees. If you lie on your back, put a pillow under your knees.  Only take medicines as told by your doctor.  Put ice on the injured area.  Put ice in a plastic bag.  Place a towel between your skin and the bag.  Leave the ice on for 15-20 minutes, 03-04 times a day for the first 2 to 3 days. After that, you can switch between ice and heat packs.  Ask your doctor about back exercises or massage.  Avoid feeling anxious or stressed. Find good ways to deal with stress, such as exercise. GET HELP RIGHT AWAY IF:   Your pain does not go away with rest or medicine.  Your pain does not go away in 1 week.  You have new problems.  You do not feel well.  The pain spreads into your legs.  You cannot control when you poop (bowel movement) or pee (urinate).  Your arms or legs feel weak or lose feeling (numbness).  You feel sick to your stomach (nauseous) or throw up (vomit).  You have belly  (abdominal) pain.  You feel like you may pass out (faint). MAKE SURE YOU:   Understand these instructions.  Will watch your condition.  Will get help right away if you are not doing well or get worse. Document Released: 04/20/2008 Document Revised: 01/25/2012 Document Reviewed: 03/06/2014 [facility] Patient Information 2015 [place], [place]. This information is not intended to replace advice given to you by your health care provider. Make sure you discuss any questions you have with your health care provider.

## 2014-10-12 NOTE — Progress Notes (Signed)
10/12/2014 Steven Hale   05-Aug-1956  [id number]  Primary Physician Angelica Chessman, MD Primary Cardiologist: Dr Stanford Breed Dr Fletcher Anon  HPI:  58 y/o with a hx of multiple interventions in the past ultimately requiring CABG in 09/2010 (LIMA-LAD, SVG-OM1, SVG-distal RCA/OM2). He also has PAD, s/p bilateral SFA stents in the past, HTN, HL, pancreatic cancer s/p surgery 2009. Last Myoview was 05/2012: EF 54%, no ischemia, no scar. In October of 2014 he presented with increased claudication. Had arteriogram which revealed an occluded distal left SFA. He had directional atherectomy and balloon angioplasty at that time. He had ISR in Oct 2015 and underwent repeat PTA by Dr Fletcher Anon.            The pt presents today with Rt leg pain. He has been seen in the ER for this 11/18, 11/20, and 11/24. He complains of pain down his Rt leg, worse with walking. He has a constant dull ache in the anterior lower leg. He does have a history of chronic back pain. He is to be established at a pain clinic later this month.  Arterial dopplers in the office today show his arterial circulation to his Rt leg is normal.    Current Outpatient Prescriptions  Medication Sig Dispense Refill  . allopurinol (ZYLOPRIM) 100 MG tablet Take 100 mg by mouth daily.   0  . aspirin EC 81 MG tablet Take 81 mg by mouth daily.      [name] atorvastatin (LIPITOR) 40 MG tablet Take 0.5 tablets (20 mg total) by mouth daily. 90 tablet 1  . clopidogrel (PLAVIX) 75 MG tablet Take 1 tablet (75 mg total) by mouth daily. 90 tablet 1  . cyclobenzaprine (FLEXERIL) 5 MG tablet Take 5 mg by mouth daily.     . diclofenac (VOLTAREN) 75 MG EC tablet Take 75 mg by mouth 2 (two) times daily.    . fish oil-omega-3 fatty acids 1000 MG capsule Take 1 capsule (1 g total) by mouth daily. 60 capsule 1  . gabapentin (NEURONTIN) 300 MG capsule Take 600 mg by mouth 3 (three) times daily.     [name] lisinopril (PRINIVIL,ZESTRIL) 5 MG tablet TAKE 1/2 TABLET BY MOUTH DAILY 45  tablet 10  . lisinopril (PRINIVIL,ZESTRIL) 5 MG tablet Take 2.5 mg by mouth daily.    . meclizine (ANTIVERT) 12.5 MG tablet Take 1 tablet (12.5 mg total) by mouth 3 (three) times daily as needed for dizziness (also available over the counter). 30 tablet 3  . metoprolol tartrate (LOPRESSOR) 25 MG tablet Take 25 mg by mouth 2 (two) times daily.    . Multiple Vitamin (MULTIVITAMIN) tablet Take 1 tablet by mouth daily. 30 tablet 10  . nitroGLYCERIN (NITROSTAT) 0.4 MG SL tablet Place 1 tablet (0.4 mg total) under the tongue every 5 (five) minutes as needed for chest pain. 15 tablet 0  . oxyCODONE-acetaminophen (PERCOCET) 7.5-325 MG per tablet Take 1 tablet by mouth every 6 (six) hours as needed for pain (pain).   0  . Propylene Glycol (SYSTANE BALANCE) 0.6 % SOLN Place 1 drop into both eyes daily.    . tadalafil (CIALIS) 20 MG tablet Take 0.5-1 tablets (10-20 mg total) by mouth every other day as needed for erectile dysfunction. 5 tablet 11  . tapentadol (NUCYNTA) 50 MG TABS tablet Take 50 mg by mouth every 4 (four) hours as needed for severe pain (pain).     . vitamin B-12 (CYANOCOBALAMIN) 100 MCG tablet Take 100 mcg by mouth daily.  No current facility-administered medications for this visit.    No Known Allergies  History   Social History  . Marital Status: Single    Spouse Name: N/A    Number of Children: 0  . Years of Education: N/A   Occupational History  . Diability    Social History Main Topics  . Smoking status: Former Smoker    Types: Cigarettes    Quit date: 11/29/2008  . Smokeless tobacco: Never Used  . Alcohol Use: 1.2 oz/week    2 Glasses of wine per week     Comment: everyday  . Drug Use: No  . Sexual Activity: Not Currently   Other Topics Concern  . 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  Cardiovascular: negative for chest pain, dyspnea on exertion, edema, orthopnea, palpitations, paroxysmal nocturnal dyspnea or shortness of breath Dermatological: negative for rash Respiratory: negative for cough or wheezing Urologic: negative for hematuria Abdominal: negative for nausea, vomiting, diarrhea, bright red blood per rectum, melena, or hematemesis Neurologic: negative for visual changes, syncope, or dizziness All other systems reviewed and are otherwise negative except as noted above.    Blood pressure 130/88, pulse 78, height 5\' 9"  (1.753 m), weight 222 lb (100.699 kg).  General appearance: alert, cooperative and no distress Lungs: clear to auscultation bilaterally Heart: regular rate and rhythm Extremities: no edema Neurologic: Grossly normal, he has equal strength in both legs. He has good tactile sensation in both legs.   EKG NSR without acute changes  ASSESSMENT AND PLAN:   Lower limb pain, anterior Pt has been in the ER 3 times in the last week for Rt leg pain.   CAD- multiple PCIs, CABG 2011, low risk Myoview 05/2012 No angina  PVD (peripheral vascular disease) History of bilat SFA PTA, Lt SFA PTA Oct 2014, ISR-PTA Oct 2015  Hyperlipidemia On statin  ESSENTIAL HYPERTENSION, BENIGN Controlled  Chronic back pain Pt is to be established with pain clinic  H/O pancreatic cancer 2009   PLAN  I had nothing to add to [name] treatment except to tell him his pain was not from impaired circulation. I tried calling his Neurologist but they're office is closed. He does have an appointment on Dec 10th. The pt was appreciative of our evaluation today, "At least I know its not poor circulation". He can follow up with Dr Stanford Breed in 6 months or sooner if needed.   Malini Flemings KPA-C 10/12/2014 10:08 AM

## 2014-10-12 NOTE — ED Notes (Signed)
Pt c/o chronic back pain. Pt reports pain in lower back with radiation to right leg.

## 2014-10-12 NOTE — Assessment & Plan Note (Signed)
Pt has been in the ER 3 times in the last week for Rt leg pain.

## 2014-10-12 NOTE — Assessment & Plan Note (Signed)
Controlled.  

## 2014-10-12 NOTE — Assessment & Plan Note (Signed)
Pt is to be established with pain clinic

## 2014-10-12 NOTE — Assessment & Plan Note (Signed)
Prior bilat SFA PTA, Lt SFA stent 08/2013, ISR 08/2014.

## 2014-10-12 NOTE — Assessment & Plan Note (Signed)
No angina 

## 2014-10-12 NOTE — Assessment & Plan Note (Signed)
Pt complains of Rt leg pain. He has been in the ER 3 times in the last week for this.

## 2014-10-12 NOTE — Patient Instructions (Addendum)
Your physician recommends that you continue on your current medications as directed. Please refer to the Current Medication list given to you today.     IF YOUR PAIN CONTINUES TO BE  UNBEARABLE PLEASE GO TO THE EMERGENCY ROOM DEPARTMENT..   Your physician wants you to follow-up in: WITH DR Early will receive a reminder letter in the mail two months in advance. If you don't receive a letter, please call our office to schedule the follow-up appointment.

## 2014-10-12 NOTE — ED Provider Notes (Signed)
CSN: [id number]     Arrival date & time 10/12/14  1320 History  This chart was scribed for Monico Blitz, PA-C, working with Charlesetta Shanks, MD found by Starleen Arms, ED Scribe. This patient was seen in room [place] and the patient's care was started at 2:39 PM.   Chief Complaint  Patient presents with  . Back Pain   The history is provided by the patient. No language interpreter was used.   HPI Comments: Steven Hale is a 57 y.o. male with a history of chronic back pain who presents to the Emergency Department complaining of worsening back pain with radiation down the right leg over the past several weeks.  Patient reports a back injury several years ago and is followed by a pain clinic.  He is scheduled to have steroid injection on 12/10, but states the pain is so bad that he does not feel he can wait that long.  He was seen by his cardiologist this morning who performed a negative doppler study to r/o DVT. He reports the pain is aggravated by walking.  Patient denies history of IV drug use.  Patient denies history of DM.  Patient denies numbness/weakness, bowel/bladder incontinence, fever.   Past Medical History  Diagnosis Date  . PAD (peripheral artery disease)     a. s/p bilat SFA stents;  b. ABIs 11/2012: R 0.99, L 0.86.  [name] Hypertension   . Hyperlipidemia   . Anemia, unspecified   . GERD (gastroesophageal reflux disease)   . Helicobacter pylori (H. pylori) infection   . Hemorrhoids   . Impotence of organic origin   . Bell's palsy   . CAD (coronary artery disease)     a. s/p multiple PCIs;  b. s/p CABG in 09/2010 (LIMA-LAD, SVG-OM1, SVG-distal RCA/OM2);  c.  Myoview 05/2012: EF 54%, no ischemia, no scar.   . Blood transfusion     during treatment for Ca  . DJD (degenerative joint disease)     low back & all over   . Pancreatic cancer     surgery 2009   Past Surgical History  Procedure Laterality Date  . Pancreatic  cancer  05/10/2008    Resection of distal panrease and  spleen  . Splenectomy    . Coronary artery bypass graft  nov 2011    x 4  . Back surgery      1992  . Peripheral vascular catheterization  Oct 2014    Lt SFA PTA  . Hemorrhoid surgery  10/30/2011    Procedure: HEMORRHOIDECTOMY;  Surgeon: Harl Bowie, MD;  Location: [facility];  Service: General;  Laterality: N/A;  . Peripheral vascular catheterization  Oct 2015    ISR Lt SFA-PTA   Family History  Problem Relation Age of Onset  . Heart attack Father     died of MI at age 10  . Anesthesia problems Neg Hx   . Hypotension Neg Hx   . Malignant hyperthermia Neg Hx   . Pseudochol deficiency Neg Hx   . Colon cancer Neg Hx   . Colon polyps Neg Hx   . Diabetes Neg Hx   . Kidney disease Neg Hx   . Esophageal cancer Neg Hx    History  Substance Use Topics  . Smoking status: Former Smoker    Types: Cigarettes    Quit date: 11/29/2008  . Smokeless tobacco: Never Used  . Alcohol Use: 1.2 oz/week    2 Glasses of wine per week  Comment: everyday    Review of Systems A complete 10 system review of systems was obtained and all systems are negative except as noted in the HPI and PMH.   Allergies  Review of patient's allergies indicates no known allergies.  Home Medications   Prior to Admission medications   Medication Sig Start Date End Date Taking? Authorizing Provider  allopurinol (ZYLOPRIM) 100 MG tablet Take 100 mg by mouth daily.  09/14/14   Historical Provider, MD  aspirin EC 81 MG tablet Take 81 mg by mouth daily.      Historical Provider, MD  atorvastatin (LIPITOR) 40 MG tablet Take 0.5 tablets (20 mg total) by mouth daily. 05/31/14   Lelon Perla, MD  clopidogrel (PLAVIX) 75 MG tablet Take 1 tablet (75 mg total) by mouth daily. 05/16/14   Wellington Hampshire, MD  diclofenac (VOLTAREN) 75 MG EC tablet Take 75 mg by mouth 2 (two) times daily.    Historical Provider, MD  fish oil-omega-3 fatty acids 1000 MG capsule Take 1 capsule (1 g total) by mouth daily. 11/30/12   Rosana Hoes, MD  gabapentin (NEURONTIN) 300 MG capsule Take 600 mg by mouth 3 (three) times daily.     Historical Provider, MD  lisinopril (PRINIVIL,ZESTRIL) 5 MG tablet TAKE 1/2 TABLET BY MOUTH DAILY 09/06/14   Lelon Perla, MD  lisinopril (PRINIVIL,ZESTRIL) 5 MG tablet Take 2.5 mg by mouth daily.    Historical Provider, MD  meclizine (ANTIVERT) 12.5 MG tablet Take 1 tablet (12.5 mg total) by mouth 3 (three) times daily as needed for dizziness (also available over the counter). 08/30/13   Ripudeep Krystal Eaton, MD  metoprolol tartrate (LOPRESSOR) 25 MG tablet Take 25 mg by mouth 2 (two) times daily.    Historical Provider, MD  Multiple Vitamin (MULTIVITAMIN) tablet Take 1 tablet by mouth daily. 11/30/12   Rosana Hoes, MD  nitroGLYCERIN (NITROSTAT) 0.4 MG SL tablet Place 1 tablet (0.4 mg total) under the tongue every 5 (five) minutes as needed for chest pain. 11/30/12   Rosana Hoes, MD  oxyCODONE (ROXICODONE) 5 MG immediate release tablet Take 2 tablets (10 mg total) by mouth every 4 (four) hours as needed. Take 1-2 tablets every 4-6 hours as needed for pain control 10/12/14   Elmyra Ricks Issaac Shipper, PA-C  predniSONE (DELTASONE) 50 MG tablet Take 1 tablet daily with breakfast 10/12/14   Elmyra Ricks Trevonte Ashkar, PA-C  Propylene Glycol (SYSTANE BALANCE) 0.6 % SOLN Place 1 drop into both eyes daily.    Historical Provider, MD  tadalafil (CIALIS) 20 MG tablet Take 0.5-1 tablets (10-20 mg total) by mouth every other day as needed for erectile dysfunction. 09/13/13   Ripudeep Krystal Eaton, MD  tapentadol (NUCYNTA) 50 MG TABS tablet Take 50 mg by mouth every 4 (four) hours as needed for severe pain (pain).     Historical Provider, MD  vitamin B-12 (CYANOCOBALAMIN) 100 MCG tablet Take 100 mcg by mouth daily.    Historical Provider, MD   BP 135/90 mmHg  Pulse 74  Temp(Src) 98.2 F (36.8 C) (Oral)  Resp 16  SpO2 97% Physical Exam  Constitutional: He is oriented to person, place, and time. He appears well-developed and well-nourished. No  distress.  HENT:  Head: Normocephalic and atraumatic.  Mouth/Throat: Oropharynx is clear and moist.  Eyes: Conjunctivae and EOM are normal. Pupils are equal, round, and reactive to light.  Neck: Normal range of motion.  Cardiovascular: Normal rate, regular rhythm and intact distal pulses.   Pulmonary/Chest: Effort normal  and breath sounds normal. No stridor. No respiratory distress. He has no wheezes. He has no rales. He exhibits no tenderness.  Abdominal: Soft. Bowel sounds are normal. He exhibits no distension and no mass. There is no tenderness. There is no rebound and no guarding.  Musculoskeletal: Normal range of motion.  No point tenderness to percussion of lumbar spinal processes.  No TTP or paraspinal muscular spasm. Strength is 5 out of 5 to bilateral lower extremities at hip and knee; extensor hallucis longus 5 out of 5. Ankle strength 5 out of 5, no clonus, neurovascularly intact. No saddle anaesthesia. Patellar reflexes are 2+ bilaterally.       Neurological: He is alert and oriented to person, place, and time.  Psychiatric: He has a normal mood and affect.  Nursing note and vitals reviewed.   ED Course  Procedures (including critical care time)  DIAGNOSTIC STUDIES: Oxygen Saturation is 95% on RA, adequate by my interpretation.    COORDINATION OF CARE:  2:53 PM Will order imaging, pain medication, and steroidal anti-inflammatories.  Patient acknowledges and agrees with plan.    Labs Review Labs Reviewed - No data to display  Imaging Review Dg Lumbar Spine Complete  10/12/2014   CLINICAL DATA:  Chronic back pain radiating to right leg for 3 weeks  EXAM: LUMBAR SPINE - COMPLETE 4+ VIEW  COMPARISON:  08/27/2014  FINDINGS: Five views of lumbar spine submitted. Moderate disc space flattening with mild anterior spurring at L4-L5 level. Significant disc space flattening with anterior spurring at L5-S1 level. Facet degenerative changes at L5 level. Atherosclerotic  calcifications of abdominal aorta. No acute fracture or subluxation.  IMPRESSION: No acute fracture or subluxation. Degenerative changes at L4-L5 and L5-S1 level.   Electronically Signed   By: Lahoma Crocker M.D.   On: 10/12/2014 15:50     EKG Interpretation None      MDM   Final diagnoses:  Low back pain with right-sided sciatica    Filed Vitals:   10/12/14 1358 10/12/14 1406 10/12/14 1618  BP: 147/91 123/82 135/90  Pulse: 85 85 74  Temp: 97.4 F (36.3 C) 98.2 F (36.8 C)   TempSrc: Oral Oral   Resp: 16 18 16   SpO2: 98% 95% 97%    Medications  predniSONE (DELTASONE) tablet 60 mg (60 mg Oral Given 10/12/14 1547)  morphine 4 MG/ML injection 4 mg (4 mg Intramuscular Given 10/12/14 1547)    Steven Hale is a 58 y.o. male presenting with exacerbation of chronic low back pain.  back pain.  No neurological deficits and normal neuro exam.  Patient can walk but states is painful.  No loss of bowel or bladder control.  No concern for cauda equina.  No fever, night sweats, weight loss,  IVDU. With history of pancreatic cancer x-ray to rule out metastases shows no acute abnormalities. RICE protocol and pain medicine indicated and discussed with patient.   Evaluation does not show pathology that would require ongoing emergent intervention or inpatient treatment. Pt is hemodynamically stable and mentating appropriately. Discussed findings and plan with patient/guardian, who agrees with care plan. All questions answered. Return precautions discussed and outpatient follow up given.   Discharge Medication List as of 10/12/2014  4:13 PM    START taking these medications   Details  oxyCODONE (ROXICODONE) 5 MG immediate release tablet Take 2 tablets (10 mg total) by mouth every 4 (four) hours as needed. Take 1-2 tablets every 4-6 hours as needed for pain control, Starting 10/12/2014, Until Discontinued,  Print    predniSONE (DELTASONE) 50 MG tablet Take 1 tablet daily with breakfast, Print          I personally performed the services described in this documentation, which was scribed in my presence. The recorded information has been reviewed and is accurate.   Monico Blitz, PA-C 10/12/14 [place], MD 10/13/14 [phone]

## 2014-10-12 NOTE — Assessment & Plan Note (Signed)
History of bilat SFA PTA, Lt SFA PTA Oct 2014, ISR-PTA Oct 2015

## 2014-10-12 NOTE — Progress Notes (Signed)
ABI Complete.  ABI was ordered STAT by Kerin Ransom, PA, due to patient having severe back and right lower extremity pain and decreased pedal pulses. Patient was a walk in appointment.

## 2014-10-12 NOTE — Assessment & Plan Note (Signed)
On statin.

## 2014-10-15 ENCOUNTER — Inpatient Hospital Stay (HOSPITAL_COMMUNITY)
Admission: EM | Admit: 2014-10-15 | Discharge: 2014-10-16 | DRG: 552 | Disposition: A | Discharge status: Home / Self Care | Payer: Medicare HMO | Attending: Internal Medicine | Admitting: Internal Medicine

## 2014-10-15 ENCOUNTER — Encounter (HOSPITAL_COMMUNITY): Payer: Self-pay

## 2014-10-15 ENCOUNTER — Emergency Department (HOSPITAL_COMMUNITY): Payer: Medicare HMO

## 2014-10-15 DIAGNOSIS — M549 Dorsalgia, unspecified: Secondary | ICD-10-CM | POA: Diagnosis not present

## 2014-10-15 DIAGNOSIS — I2510 Atherosclerotic heart disease of native coronary artery without angina pectoris: Secondary | ICD-10-CM

## 2014-10-15 DIAGNOSIS — E349 Endocrine disorder, unspecified: Secondary | ICD-10-CM | POA: Diagnosis present

## 2014-10-15 DIAGNOSIS — Z87891 Personal history of nicotine dependence: Secondary | ICD-10-CM | POA: Diagnosis not present

## 2014-10-15 DIAGNOSIS — E785 Hyperlipidemia, unspecified: Secondary | ICD-10-CM

## 2014-10-15 DIAGNOSIS — I739 Peripheral vascular disease, unspecified: Secondary | ICD-10-CM

## 2014-10-15 DIAGNOSIS — Z7902 Long term (current) use of antithrombotics/antiplatelets: Secondary | ICD-10-CM | POA: Diagnosis not present

## 2014-10-15 DIAGNOSIS — M545 Low back pain: Secondary | ICD-10-CM | POA: Diagnosis present

## 2014-10-15 DIAGNOSIS — H8110 Benign paroxysmal vertigo, unspecified ear: Secondary | ICD-10-CM | POA: Diagnosis not present

## 2014-10-15 DIAGNOSIS — E291 Testicular hypofunction: Secondary | ICD-10-CM | POA: Diagnosis present

## 2014-10-15 DIAGNOSIS — M5116 Intervertebral disc disorders with radiculopathy, lumbar region: Secondary | ICD-10-CM | POA: Diagnosis present

## 2014-10-15 DIAGNOSIS — Z955 Presence of coronary angioplasty implant and graft: Secondary | ICD-10-CM | POA: Diagnosis not present

## 2014-10-15 DIAGNOSIS — Z951 Presence of aortocoronary bypass graft: Secondary | ICD-10-CM | POA: Diagnosis present

## 2014-10-15 DIAGNOSIS — D509 Iron deficiency anemia, unspecified: Secondary | ICD-10-CM | POA: Diagnosis present

## 2014-10-15 DIAGNOSIS — G8929 Other chronic pain: Secondary | ICD-10-CM | POA: Diagnosis present

## 2014-10-15 DIAGNOSIS — Z7982 Long term (current) use of aspirin: Secondary | ICD-10-CM | POA: Diagnosis not present

## 2014-10-15 DIAGNOSIS — Z79899 Other long term (current) drug therapy: Secondary | ICD-10-CM | POA: Diagnosis not present

## 2014-10-15 DIAGNOSIS — K219 Gastro-esophageal reflux disease without esophagitis: Secondary | ICD-10-CM | POA: Diagnosis present

## 2014-10-15 DIAGNOSIS — Z8507 Personal history of malignant neoplasm of pancreas: Secondary | ICD-10-CM

## 2014-10-15 DIAGNOSIS — I10 Essential (primary) hypertension: Secondary | ICD-10-CM | POA: Diagnosis present

## 2014-10-15 LAB — HEPATIC FUNCTION PANEL
ALT: 31 U/L (ref 0–53)
AST: 35 U/L (ref 0–37)
Albumin: 4.6 g/dL (ref 3.5–5.2)
Alkaline Phosphatase: 66 U/L (ref 39–117)
Bilirubin, Direct: <0.2 mg/dL (ref 0.0–0.3)
TOTAL PROTEIN: 8.4 g/dL — AB (ref 6.0–8.3)
Total Bilirubin: 0.6 mg/dL (ref 0.3–1.2)

## 2014-10-15 LAB — BASIC METABOLIC PANEL
ANION GAP: 16 — AB (ref 5–15)
BUN: 17 mg/dL (ref 6–23)
CHLORIDE: 100 meq/L (ref 96–112)
CO2: 24 mEq/L (ref 19–32)
Calcium: 10.3 mg/dL (ref 8.4–10.5)
Creatinine, Ser: 0.99 mg/dL (ref 0.50–1.35)
GFR, EST NON AFRICAN AMERICAN: 88 mL/min — AB (ref >90)
Glucose, Bld: 171 mg/dL — ABNORMAL HIGH (ref 70–99)
POTASSIUM: 5.2 meq/L (ref 3.7–5.3)
SODIUM: 140 meq/L (ref 137–147)

## 2014-10-15 LAB — DIFFERENTIAL
BASOS ABS: 0 10*3/uL (ref 0.0–0.1)
BASOS PCT: 0 % (ref 0–1)
EOS ABS: 0 10*3/uL (ref 0.0–0.7)
Eosinophils Relative: 0 % (ref 0–5)
Lymphocytes Relative: 27 % (ref 12–46)
Lymphs Abs: 1.8 10*3/uL (ref 0.7–4.0)
MONOS PCT: 3 % (ref 3–12)
Monocytes Absolute: 0.2 10*3/uL (ref 0.1–1.0)
Neutro Abs: 4.7 10*3/uL (ref 1.7–7.7)
Neutrophils Relative %: 70 % (ref 43–77)

## 2014-10-15 LAB — CBC
HCT: 38.2 % — ABNORMAL LOW (ref 39.0–52.0)
HEMOGLOBIN: 12.1 g/dL — AB (ref 13.0–17.0)
MCH: 30 pg (ref 26.0–34.0)
MCHC: 31.7 g/dL (ref 30.0–36.0)
MCV: 94.6 fL (ref 78.0–100.0)
PLATELETS: 286 10*3/uL (ref 150–400)
RBC: 4.04 MIL/uL — AB (ref 4.22–5.81)
RDW: 15.2 % (ref 11.5–15.5)
WBC: 7 10*3/uL (ref 4.0–10.5)

## 2014-10-15 LAB — PHOSPHORUS: PHOSPHORUS: 3.9 mg/dL (ref 2.3–4.6)

## 2014-10-15 LAB — PROTIME-INR
INR: 1.02 (ref 0.00–1.49)
Prothrombin Time: 13.5 seconds (ref 11.6–15.2)

## 2014-10-15 LAB — APTT: APTT: 25 s (ref 24–37)

## 2014-10-15 LAB — MAGNESIUM: MAGNESIUM: 2.7 mg/dL — AB (ref 1.5–2.5)

## 2014-10-15 LAB — TSH: TSH: 0.752 u[IU]/mL (ref 0.350–4.500)

## 2014-10-15 MED ORDER — CLOPIDOGREL BISULFATE 75 MG PO TABS
75.0000 mg | ORAL_TABLET | Freq: Every day | ORAL | Status: DC
  Administered 2014-10-16: 75 mg via ORAL
  Filled 2014-10-15: qty 1

## 2014-10-15 MED ORDER — ACETAMINOPHEN 325 MG PO TABS: 650.0000 mg | ORAL_TABLET | Freq: Four times a day (QID) | ORAL | Status: DC | PRN

## 2014-10-15 MED ORDER — GABAPENTIN 300 MG PO CAPS
600.0000 mg | ORAL_CAPSULE | Freq: Three times a day (TID) | ORAL | Status: DC
  Administered 2014-10-15 – 2014-10-16 (×3): 600 mg via ORAL
  Filled 2014-10-15 (×5): qty 2

## 2014-10-15 MED ORDER — OXYCODONE-ACETAMINOPHEN 5-325 MG PO TABS
2.0000 | ORAL_TABLET | ORAL | Status: DC | PRN
  Administered 2014-10-15 – 2014-10-16 (×3): 2 via ORAL
  Filled 2014-10-15 (×4): qty 2

## 2014-10-15 MED ORDER — OMEGA-3 FATTY ACIDS 1000 MG PO CAPS: 1.0000 g | ORAL_CAPSULE | Freq: Every day | ORAL | Status: DC

## 2014-10-15 MED ORDER — HYDROMORPHONE HCL 1 MG/ML IJ SOLN: 0.5000 mg | INTRAMUSCULAR | Status: DC | PRN

## 2014-10-15 MED ORDER — HYDROMORPHONE HCL 1 MG/ML IJ SOLN: 1.0000 mg | INTRAMUSCULAR | Status: DC | PRN

## 2014-10-15 MED ORDER — LISINOPRIL 2.5 MG PO TABS
2.5000 mg | ORAL_TABLET | Freq: Every day | ORAL | Status: DC
  Administered 2014-10-16: 2.5 mg via ORAL
  Filled 2014-10-15: qty 1

## 2014-10-15 MED ORDER — MECLIZINE HCL 12.5 MG PO TABS
12.5000 mg | ORAL_TABLET | Freq: Three times a day (TID) | ORAL | Status: DC | PRN
  Filled 2014-10-15: qty 1

## 2014-10-15 MED ORDER — HYDROMORPHONE HCL 1 MG/ML IJ SOLN
1.0000 mg | Freq: Once | INTRAMUSCULAR | Status: AC
  Administered 2014-10-15: 1 mg via INTRAVENOUS

## 2014-10-15 MED ORDER — OXYCODONE-ACETAMINOPHEN 5-325 MG PO TABS
1.5000 | ORAL_TABLET | ORAL | Status: DC | PRN
Start: 2014-10-15 — End: 2014-10-15
  Administered 2014-10-15: 1.5 via ORAL
  Filled 2014-10-15: qty 2

## 2014-10-15 MED ORDER — HYDROMORPHONE HCL 2 MG/ML IJ SOLN
2.0000 mg | INTRAMUSCULAR | Status: DC | PRN
  Administered 2014-10-15 (×2): 2 mg via INTRAVENOUS
  Filled 2014-10-15 (×2): qty 1

## 2014-10-15 MED ORDER — ACETAMINOPHEN 650 MG RE SUPP: 650.0000 mg | Freq: Four times a day (QID) | RECTAL | Status: DC | PRN

## 2014-10-15 MED ORDER — VITAMIN B-12 100 MCG PO TABS
100.0000 ug | ORAL_TABLET | Freq: Every day | ORAL | Status: DC
  Administered 2014-10-16: 100 ug via ORAL
  Filled 2014-10-15: qty 1

## 2014-10-15 MED ORDER — METOPROLOL TARTRATE 25 MG PO TABS
25.0000 mg | ORAL_TABLET | Freq: Two times a day (BID) | ORAL | Status: DC
  Administered 2014-10-15 – 2014-10-16 (×2): 25 mg via ORAL
  Filled 2014-10-15 (×3): qty 1

## 2014-10-15 MED ORDER — KETOROLAC TROMETHAMINE 15 MG/ML IJ SOLN
15.0000 mg | Freq: Once | INTRAMUSCULAR | Status: AC
  Administered 2014-10-15: 15 mg via INTRAVENOUS
  Filled 2014-10-15: qty 1

## 2014-10-15 MED ORDER — HYDROMORPHONE HCL 1 MG/ML IJ SOLN
1.0000 mg | Freq: Once | INTRAMUSCULAR | Status: AC
  Administered 2014-10-15: 1 mg via INTRAVENOUS
  Filled 2014-10-15: qty 1

## 2014-10-15 MED ORDER — SODIUM CHLORIDE 0.9 % IV SOLN
INTRAVENOUS | Status: DC
  Administered 2014-10-15: 16:00:00 via INTRAVENOUS

## 2014-10-15 MED ORDER — POLYVINYL ALCOHOL 1.4 % OP SOLN
1.0000 [drp] | OPHTHALMIC | Status: DC | PRN
  Filled 2014-10-15: qty 15

## 2014-10-15 MED ORDER — ONDANSETRON HCL 4 MG/2ML IJ SOLN
4.0000 mg | Freq: Three times a day (TID) | INTRAMUSCULAR | Status: DC | PRN
Start: 2014-10-15 — End: 2014-10-15

## 2014-10-15 MED ORDER — ALLOPURINOL 100 MG PO TABS
100.0000 mg | ORAL_TABLET | Freq: Every day | ORAL | Status: DC
  Administered 2014-10-16: 100 mg via ORAL
  Filled 2014-10-15: qty 1

## 2014-10-15 MED ORDER — KETOROLAC TROMETHAMINE 60 MG/2ML IM SOLN: 60.0000 mg | Freq: Once | INTRAMUSCULAR | Status: DC

## 2014-10-15 MED ORDER — PREDNISONE 50 MG PO TABS
50.0000 mg | ORAL_TABLET | Freq: Every day | ORAL | Status: DC
  Administered 2014-10-16: 50 mg via ORAL
  Filled 2014-10-15 (×2): qty 1

## 2014-10-15 MED ORDER — ATORVASTATIN CALCIUM 20 MG PO TABS
20.0000 mg | ORAL_TABLET | Freq: Every day | ORAL | Status: DC
  Administered 2014-10-16: 20 mg via ORAL
  Filled 2014-10-15: qty 1

## 2014-10-15 MED ORDER — ONDANSETRON HCL 4 MG/2ML IJ SOLN: 4.0000 mg | Freq: Four times a day (QID) | INTRAMUSCULAR | Status: DC | PRN

## 2014-10-15 MED ORDER — ADULT MULTIVITAMIN W/MINERALS CH
1.0000 | ORAL_TABLET | Freq: Every day | ORAL | Status: DC
  Administered 2014-10-16: 1 via ORAL
  Filled 2014-10-15: qty 1

## 2014-10-15 MED ORDER — PROPYLENE GLYCOL 0.6 % OP SOLN: 1.0000 [drp] | Freq: Every day | OPHTHALMIC | Status: DC

## 2014-10-15 MED ORDER — ASPIRIN EC 81 MG PO TBEC
81.0000 mg | DELAYED_RELEASE_TABLET | Freq: Every day | ORAL | Status: DC
  Administered 2014-10-16: 81 mg via ORAL
  Filled 2014-10-15: qty 1

## 2014-10-15 MED ORDER — DICLOFENAC SODIUM 75 MG PO TBEC
75.0000 mg | DELAYED_RELEASE_TABLET | Freq: Two times a day (BID) | ORAL | Status: DC
  Administered 2014-10-15 – 2014-10-16 (×2): 75 mg via ORAL
  Filled 2014-10-15 (×3): qty 1

## 2014-10-15 MED ORDER — OMEGA-3-ACID ETHYL ESTERS 1 G PO CAPS
1.0000 g | ORAL_CAPSULE | Freq: Every day | ORAL | Status: DC
  Administered 2014-10-16: 1 g via ORAL
  Filled 2014-10-15: qty 1

## 2014-10-15 MED ORDER — HYDROMORPHONE HCL 1 MG/ML IJ SOLN
1.0000 mg | Freq: Once | INTRAMUSCULAR | Status: DC
  Filled 2014-10-15: qty 1

## 2014-10-15 NOTE — H&P (Signed)
[facility] History and Physical  Steven Hale NFA:[id number] DOB: [birth year] DOA: 10/15/2014  Referring physician: ER physician PCP: Angelica Chessman, MD   Chief Complaint: back pain   HPI:  58 year old male with past medical history of pancreatic ca (no evidence of recurrence), CAD, hypertensin, dyslipidemia, chronic back pain who presented to [facility] ED with intractable low back pain for past several days prior to this admission.  Pain was 10/10 in intensity and not relieved with home analgesics such as percocet. Patient was seen in ED on 11/27/205 for same symptoms but subsequently discharged home since no acute pathology seen. Pt has difficulty ambulation due to severity of the pain. No falls. No other complaints of chest pain, shortness of breath or palpitations. No fevers or chills. No reports of abdominal pain, nausea or vomiting.   In ED, vitals were stable. No significant abnormalities on blood work. MRI lumbar spine showed new right L4-L5 lateral recess/proximal right L4 foraminal disc extrusion, multifactorial right L4 foraminal stenosis, no acute fracture and other findings are stable.   Assessment & Plan    Principal Problem:   Chronic back pain / history of lumbar disc herniation with radiculopathy -  MRI lumbar spine showed new right L4-L5 lateral recess/proximal right L4 foraminal disc extrusion, multifactorial right L4 foraminal stenosis, no acute fracture and other findings are stable.  - pain management with dilaudid 2 mg IV every 2 hours as needed for severe pain, percocet PO Q 4 hours PRN moderate pain - PT evaluation  Active Problems:   Hyperlipidemia - continue statin therapy    Essential hypertension - continue lisinopril and metoprolol    CAD- multiple PCIs, CABG 2011, low risk Myoview 05/2012 - stable, no complaints of chest pain - continue aspirin and plavix    Claudication / PAD (peripheral artery disease) / PVD (peripheral vascular disease) - continue  aspirin, plavix and gabapentin    Benign paroxysmal positional vertigo - meclizine PRN   Testosterone deficiency - continue daily prednisone    Iron deficiency anemia - hemoglobin stable at 12.1  - no current indications for transfusion    H/O pancreatic cancer 2009 - no evidence of recurrence   DVT prophylaxis:  - pt on aspirin, plavix and SCD's bilaterally   Radiological Exams on Admission: Mr Lumbar Spine Wo Contrast 10/15/2014   1. Truncated and motion degraded exam, but with a new right L4-L5 lateral recess/proximal right L4 foraminal disc extrusion evident on series 3, image 4. This most affects the course of the exiting right L4 nerve, and exacerbates pre-existing multifactorial right L4 foraminal stenosis. 2. Lumbar thecal sac patency otherwise stable since April.   Electronically Signed   By: Lars Pinks M.D.   On: 10/15/2014 11:58    Code Status: Full Family Communication: Plan of care discussed with the patient  Disposition Plan: Admit for further evaluation  Leisa Lenz, MD  [facility] Pager [phone]  Review of Systems:  Constitutional: Negative for fever, chills and malaise/fatigue. Negative for diaphoresis.  HENT: Negative for hearing loss, ear pain, nosebleeds, congestion, sore throat, neck pain, tinnitus and ear discharge.   Eyes: Negative for blurred vision, double vision, photophobia, pain, discharge and redness.  Respiratory: Negative for cough, hemoptysis, sputum production, shortness of breath, wheezing and stridor.   Cardiovascular: Negative for chest pain, palpitations, orthopnea, claudication and leg swelling.  Gastrointestinal: Negative for nausea, vomiting and abdominal pain. Negative for heartburn, constipation, blood in stool and melena.  Genitourinary: Negative for dysuria, urgency, frequency, hematuria  and flank pain.  Musculoskeletal: per HPI Skin: Negative for itching and rash.  Neurological: positive for weakness no dizziness, no sensation  loss, no tremors Endo/Heme/Allergies: Negative for environmental allergies and polydipsia. Does not bruise/bleed easily.  Psychiatric/Behavioral: Negative for suicidal ideas. The patient is not nervous/anxious.      Past Medical History  Diagnosis Date  . PAD (peripheral artery disease)     a. s/p bilat SFA stents;  b. ABIs 11/2012: R 0.99, L 0.86.  [name] Hypertension   . Hyperlipidemia   . Anemia, unspecified   . GERD (gastroesophageal reflux disease)   . Helicobacter pylori (H. pylori) infection   . Hemorrhoids   . Impotence of organic origin   . Bell's palsy   . CAD (coronary artery disease)     a. s/p multiple PCIs;  b. s/p CABG in 09/2010 (LIMA-LAD, SVG-OM1, SVG-distal RCA/OM2);  c.  Myoview 05/2012: EF 54%, no ischemia, no scar.   . Blood transfusion     during treatment for Ca  . DJD (degenerative joint disease)     low back & all over   . Pancreatic cancer     surgery 2009   Past Surgical History  Procedure Laterality Date  . Pancreatic  cancer  05/10/2008    Resection of distal panrease and spleen  . Splenectomy    . Coronary artery bypass graft  nov 2011    x 4  . Back surgery      1992  . Peripheral vascular catheterization  Oct 2014    Lt SFA PTA  . Hemorrhoid surgery  10/30/2011    Procedure: HEMORRHOIDECTOMY;  Surgeon: Harl Bowie, MD;  Location: [place];  Service: General;  Laterality: N/A;  . Peripheral vascular catheterization  Oct 2015    ISR Lt SFA-PTA   Social History:  reports that he quit smoking about 5 years ago. His smoking use included Cigarettes. He smoked 0.00 packs per day. He has never used smokeless tobacco. He reports that he drinks about 1.2 oz of alcohol per week. He reports that he does not use illicit drugs.  No Known Allergies  Family History:  Family History  Problem Relation Age of Onset  . Heart attack Father     died of MI at age 43  . Anesthesia problems Neg Hx   . Hypotension Neg Hx   . Malignant hyperthermia Neg Hx   .  Pseudochol deficiency Neg Hx   . Colon cancer Neg Hx   . Colon polyps Neg Hx   . Diabetes Neg Hx   . Kidney disease Neg Hx   . Esophageal cancer Neg Hx      Prior to Admission medications   Medication Sig Start Date End Date Taking? Authorizing Provider  allopurinol (ZYLOPRIM) 100 MG tablet Take 100 mg by mouth daily.  09/14/14  Yes Historical Provider, MD  aspirin EC 81 MG tablet Take 81 mg by mouth daily.     Yes Historical Provider, MD  atorvastatin (LIPITOR) 40 MG tablet Take 0.5 tablets (20 mg total) by mouth daily. 05/31/14  Yes Lelon Perla, MD  clopidogrel (PLAVIX) 75 MG tablet Take 1 tablet (75 mg total) by mouth daily. 05/16/14  Yes Wellington Hampshire, MD  diclofenac (VOLTAREN) 75 MG EC tablet Take 75 mg by mouth 2 (two) times daily.   Yes Historical Provider, MD  fish oil-omega-3 fatty acids 1000 MG capsule Take 1 capsule (1 g total) by mouth daily. 11/30/12  Yes Rosana Hoes,  MD  gabapentin (NEURONTIN) 300 MG capsule Take 600 mg by mouth 3 (three) times daily.    Yes Historical Provider, MD  lisinopril (PRINIVIL,ZESTRIL) 5 MG tablet TAKE 1/2 TABLET BY MOUTH DAILY 09/06/14  Yes Lelon Perla, MD  meclizine (ANTIVERT) 12.5 MG tablet Take 1 tablet (12.5 mg total) by mouth 3 (three) times daily as needed for dizziness (also available over the counter). 08/30/13  Yes Ripudeep Krystal Eaton, MD  metoprolol tartrate (LOPRESSOR) 25 MG tablet Take 25 mg by mouth 2 (two) times daily.   Yes Historical Provider, MD  Multiple Vitamin (MULTIVITAMIN) tablet Take 1 tablet by mouth daily. 11/30/12  Yes Rosana Hoes, MD  nitroGLYCERIN (NITROSTAT) 0.4 MG SL tablet Place 1 tablet (0.4 mg total) under the tongue every 5 (five) minutes as needed for chest pain. 11/30/12  Yes Rosana Hoes, MD  oxyCODONE (ROXICODONE) 5 MG immediate release tablet Take 2 tablets (10 mg total) by mouth every 4 (four) hours as needed. Take 1-2 tablets every 4-6 hours as needed for pain control 10/12/14  Yes Nicole Pisciotta, PA-C   oxyCODONE-acetaminophen (PERCOCET) 7.5-325 MG per tablet Take 1 tablet by mouth 3 (three) times daily as needed. pain 09/20/14  Yes Historical Provider, MD  predniSONE (DELTASONE) 50 MG tablet Take 1 tablet daily with breakfast 10/12/14  Yes Nicole Pisciotta, PA-C  Propylene Glycol (SYSTANE BALANCE) 0.6 % SOLN Place 1 drop into both eyes daily.   Yes Historical Provider, MD  tadalafil (CIALIS) 20 MG tablet Take 0.5-1 tablets (10-20 mg total) by mouth every other day as needed for erectile dysfunction. 09/13/13  Yes Ripudeep Krystal Eaton, MD  tapentadol (NUCYNTA) 50 MG TABS tablet Take 50 mg by mouth every 4 (four) hours as needed for severe pain (pain).    Yes Historical Provider, MD  vitamin B-12 (CYANOCOBALAMIN) 100 MCG tablet Take 100 mcg by mouth daily.   Yes Historical Provider, MD   Physical Exam: Filed Vitals:   10/15/14 0937 10/15/14 1231  BP: 145/89 129/71  Pulse: 81 72  Temp: 98.5 F (36.9 C) 98.2 F (36.8 C)  TempSrc: Oral Oral  Resp: 18 16  SpO2: 98% 96%    Physical Exam  Constitutional: Appears well-developed and well-nourished. No distress.  HENT: Normocephalic. No tonsillar erythema or exudates Eyes: Conjunctivae and EOM are normal. PERRLA, no scleral icterus.  Neck: Normal ROM. Neck supple. No JVD. No tracheal deviation. No thyromegaly.  CVS: RRR, S1/S2 +, no murmurs, no gallops, no carotid bruit.  Pulmonary: Effort and breath sounds normal, no stridor, rhonchi, wheezes, rales.  Abdominal: Soft. BS +,  no distension, tenderness, rebound or guarding.  Musculoskeletal: back pain, limited range of motion due to pain.  Lymphadenopathy: No lymphadenopathy noted, cervical, inguinal. Neuro: Alert. Normal reflexes, muscle tone coordination. No focal neurologic deficits. Skin: Skin is warm and dry. No rash noted. Not diaphoretic. No erythema. No pallor.  Psychiatric: Normal mood and affect. Behavior, judgment, thought content normal.   Labs on Admission:  Basic Metabolic  Panel:  Recent Labs Lab 10/15/14 1334  NA 140  K 5.2  CL 100  CO2 24  GLUCOSE PENDING  BUN 17  CREATININE 0.99  CALCIUM 10.3   Liver Function Tests: No results for input(s): AST, ALT, ALKPHOS, BILITOT, PROT, ALBUMIN in the last 168 hours. No results for input(s): LIPASE, AMYLASE in the last 168 hours. No results for input(s): AMMONIA in the last 168 hours. CBC:  Recent Labs Lab 10/15/14 1334  WBC 7.0  HGB 12.1*  HCT  38.2*  MCV 94.6  PLT 286   Cardiac Enzymes: No results for input(s): CKTOTAL, CKMB, CKMBINDEX, TROPONINI in the last 168 hours. BNP: Invalid input(s): POCBNP CBG: No results for input(s): GLUCAP in the last 168 hours.  If 7PM-7AM, please contact night-coverage [email or web address] Password [email or web address] 10/15/2014, 2:24 PM

## 2014-10-15 NOTE — ED Notes (Signed)
Pt seen three days ago for chronic pain.  Pt states pain has not gotten better.

## 2014-10-15 NOTE — Progress Notes (Addendum)
Changed status from inpatient to obs per care management. Steven Hale

## 2014-10-15 NOTE — ED Notes (Signed)
Patient transported to MRI 

## 2014-10-15 NOTE — ED Provider Notes (Signed)
CSN: [id number]     Arrival date & time 10/15/14  7169 History   First MD Initiated Contact with Patient 10/15/14 [phone]     Chief Complaint  Patient presents with  . Back Pain     (Consider location/radiation/quality/duration/timing/severity/associated sxs/prior Treatment) HPI Comments: 58 year old male with pancreatic cancer/resection, CAD, anemia, high blood pressure, peripheral arterial disease presents with worsening right-sided back pain. Patient has had gradually worsening pain for 2 months however yesterday patient had acute pop and worsening has had difficulty with ambulation and worsening difficulty with urination. No incontinence. No focal leg weakness or numbness. Pain with walking and with straightening the right leg. Patient has had an MRI in the past showing significant degeneration. Patient has seen orthopedics Dr. Rolena Infante and has appointment with pain management on December 10. Patient is been taking narcotics for pain. Patient is not out of narcotics however they're not helping since this worsening episode.  Patient is a 58 y.o. male presenting with back pain. The history is provided by the patient.  Back Pain Associated symptoms: no abdominal pain, no chest pain, no dysuria, no fever, no headaches, no numbness and no weakness     Past Medical History  Diagnosis Date  . PAD (peripheral artery disease)     a. s/p bilat SFA stents;  b. ABIs 11/2012: R 0.99, L 0.86.  [name] Hypertension   . Hyperlipidemia   . Anemia, unspecified   . GERD (gastroesophageal reflux disease)   . Helicobacter pylori (H. pylori) infection   . Hemorrhoids   . Impotence of organic origin   . Bell's palsy   . CAD (coronary artery disease)     a. s/p multiple PCIs;  b. s/p CABG in 09/2010 (LIMA-LAD, SVG-OM1, SVG-distal RCA/OM2);  c.  Myoview 05/2012: EF 54%, no ischemia, no scar.   . Blood transfusion     during treatment for Ca  . DJD (degenerative joint disease)     low back & all over   . Pancreatic  cancer     surgery 2009   Past Surgical History  Procedure Laterality Date  . Pancreatic  cancer  05/10/2008    Resection of distal panrease and spleen  . Splenectomy    . Coronary artery bypass graft  nov 2011    x 4  . Back surgery      1992  . Peripheral vascular catheterization  Oct 2014    Lt SFA PTA  . Hemorrhoid surgery  10/30/2011    Procedure: HEMORRHOIDECTOMY;  Surgeon: Harl Bowie, MD;  Location: [place];  Service: General;  Laterality: N/A;  . Peripheral vascular catheterization  Oct 2015    ISR Lt SFA-PTA   Family History  Problem Relation Age of Onset  . Heart attack Father     died of MI at age 25  . Anesthesia problems Neg Hx   . Hypotension Neg Hx   . Malignant hyperthermia Neg Hx   . Pseudochol deficiency Neg Hx   . Colon cancer Neg Hx   . Colon polyps Neg Hx   . Diabetes Neg Hx   . Kidney disease Neg Hx   . Esophageal cancer Neg Hx    History  Substance Use Topics  . Smoking status: Former Smoker    Types: Cigarettes    Quit date: 11/29/2008  . Smokeless tobacco: Never Used  . Alcohol Use: 1.2 oz/week    2 Glasses of wine per week     Comment: everyday    Review  of Systems  Constitutional: Negative for fever and chills.  HENT: Negative for congestion.   Eyes: Negative for visual disturbance.  Respiratory: Negative for shortness of breath.   Cardiovascular: Negative for chest pain.  Gastrointestinal: Negative for vomiting and abdominal pain.  Genitourinary: Positive for difficulty urinating. Negative for dysuria and flank pain.  Musculoskeletal: Positive for back pain and gait problem. Negative for neck pain and neck stiffness.  Skin: Negative for rash.  Neurological: Negative for weakness, light-headedness, numbness and headaches.      Allergies  Review of patient's allergies indicates no known allergies.  Home Medications   Prior to Admission medications   Medication Sig Start Date End Date Taking? Authorizing Provider   allopurinol (ZYLOPRIM) 100 MG tablet Take 100 mg by mouth daily.  09/14/14  Yes Historical Provider, MD  aspirin EC 81 MG tablet Take 81 mg by mouth daily.     Yes Historical Provider, MD  atorvastatin (LIPITOR) 40 MG tablet Take 0.5 tablets (20 mg total) by mouth daily. 05/31/14  Yes Lelon Perla, MD  clopidogrel (PLAVIX) 75 MG tablet Take 1 tablet (75 mg total) by mouth daily. 05/16/14  Yes Wellington Hampshire, MD  diclofenac (VOLTAREN) 75 MG EC tablet Take 75 mg by mouth 2 (two) times daily.   Yes Historical Provider, MD  fish oil-omega-3 fatty acids 1000 MG capsule Take 1 capsule (1 g total) by mouth daily. 11/30/12  Yes Rosana Hoes, MD  gabapentin (NEURONTIN) 300 MG capsule Take 600 mg by mouth 3 (three) times daily.    Yes Historical Provider, MD  lisinopril (PRINIVIL,ZESTRIL) 5 MG tablet TAKE 1/2 TABLET BY MOUTH DAILY 09/06/14  Yes Lelon Perla, MD  meclizine (ANTIVERT) 12.5 MG tablet Take 1 tablet (12.5 mg total) by mouth 3 (three) times daily as needed for dizziness (also available over the counter). 08/30/13  Yes Ripudeep Krystal Eaton, MD  metoprolol tartrate (LOPRESSOR) 25 MG tablet Take 25 mg by mouth 2 (two) times daily.   Yes Historical Provider, MD  Multiple Vitamin (MULTIVITAMIN) tablet Take 1 tablet by mouth daily. 11/30/12  Yes Rosana Hoes, MD  nitroGLYCERIN (NITROSTAT) 0.4 MG SL tablet Place 1 tablet (0.4 mg total) under the tongue every 5 (five) minutes as needed for chest pain. 11/30/12  Yes Rosana Hoes, MD  oxyCODONE (ROXICODONE) 5 MG immediate release tablet Take 2 tablets (10 mg total) by mouth every 4 (four) hours as needed. Take 1-2 tablets every 4-6 hours as needed for pain control 10/12/14  Yes Nicole Pisciotta, PA-C  oxyCODONE-acetaminophen (PERCOCET) 7.5-325 MG per tablet Take 1 tablet by mouth 3 (three) times daily as needed. pain 09/20/14  Yes Historical Provider, MD  predniSONE (DELTASONE) 50 MG tablet Take 1 tablet daily with breakfast 10/12/14  Yes Nicole Pisciotta, PA-C   Propylene Glycol (SYSTANE BALANCE) 0.6 % SOLN Place 1 drop into both eyes daily.   Yes Historical Provider, MD  tadalafil (CIALIS) 20 MG tablet Take 0.5-1 tablets (10-20 mg total) by mouth every other day as needed for erectile dysfunction. 09/13/13  Yes Ripudeep Krystal Eaton, MD  tapentadol (NUCYNTA) 50 MG TABS tablet Take 50 mg by mouth every 4 (four) hours as needed for severe pain (pain).    Yes Historical Provider, MD  vitamin B-12 (CYANOCOBALAMIN) 100 MCG tablet Take 100 mcg by mouth daily.   Yes Historical Provider, MD   BP 129/71 mmHg  Pulse 72  Temp(Src) 98.2 F (36.8 C) (Oral)  Resp 16  SpO2 96% Physical Exam  Constitutional:  He is oriented to person, place, and time. He appears well-developed and well-nourished.  HENT:  Head: Normocephalic and atraumatic.  Eyes: Conjunctivae are normal. Right eye exhibits no discharge. Left eye exhibits no discharge.  Neck: Normal range of motion. Neck supple. No tracheal deviation present.  Cardiovascular: Normal rate.   Pulmonary/Chest: Effort normal.  Abdominal: Soft. There is no tenderness. There is no guarding.  Musculoskeletal: He exhibits tenderness. He exhibits no edema.  Tender right paraspinal lower lumbar with palpation  Neurological: He is alert and oriented to person, place, and time. GCS eye subscore is 4. GCS verbal subscore is 5. GCS motor subscore is 6.  Reflex Scores:      Patellar reflexes are 1+ on the right side and 1+ on the left side.      Achilles reflexes are 1+ on the right side and 1+ on the left side. Patient has 5+ strength with hip flexion and knee extension and flexion and toe dorsi flexion and plantar flexion bilateral. Mild decreased giveaway strength secondary to pain on the right Patient has equal sensation to palpation bilateral lower extremities, good rectal tone NP renal sensation patient has significant pain with raising the right leg.  Skin: Skin is warm. No rash noted.  Psychiatric: He has a normal mood and  affect.  Nursing note and vitals reviewed.   ED Course  Procedures (including critical care time) Labs Review Labs Reviewed  BASIC METABOLIC PANEL  CBC    Imaging Review Mr Lumbar Spine Wo Contrast  10/15/2014   CLINICAL DATA:  57 year old male with severe low back pain radiating to the right lower extremity. Initial encounter. Prior spine surgery. Personal history of pancreatic tail cancer status post for section.  EXAM: MRI LUMBAR SPINE WITHOUT CONTRAST  TECHNIQUE: Multiplanar, multisequence MR imaging of the lumbar spine was performed. No intravenous contrast was administered.  COMPARISON:  [facility] Lumbar MRI 03/10/2014  FINDINGS: The examination had to be discontinued prior to completion due to patient pain. Study is up to moderately degraded by motion artifact despite repeated imaging attempts. Sagittal T1 and IR images provided. Motion degraded axial T2 images provided. These are compared to the previous exam.  Same numbering system used as in April. Stable vertebral height and alignment, mild retrolisthesis at L5-S1 re - identified. No definite marrow edema or evidence of acute osseous abnormality.  Lumbar thecal sac patency appears stable since April except for increased epidural lipomatosis suspected at L4-L5.  Furthermore, best seen on series 3, image 4 there is a new right lateral recess and/or proximal foraminal disc extrusion on the right at L4-L5, exacerbating chronic multifactorial right L4 foraminal stenosis at that level. There could be a small sequestered disc fragment up to 7 mm diameter. Previously a right foraminal, extra foraminal disc protrusion was evident here (series 7, image 24 on prior study).  No other acute finding identified.  IMPRESSION: 1. Truncated and motion degraded exam, but with a new right L4-L5 lateral recess/proximal right L4 foraminal disc extrusion evident on series 3, image 4. This most affects the course of the exiting right L4  nerve, and exacerbates pre-existing multifactorial right L4 foraminal stenosis. 2. Lumbar thecal sac patency otherwise stable since April.   Electronically Signed   By: Lars Pinks M.D.   On: 10/15/2014 11:58     EKG Interpretation None      MDM   Final diagnoses:  Acute back pain  Lumbar disc herniation with radiculopathy  Patient presents with acute on  chronic back pain. Patient feels is gradually worsened and had acute worsening and son pop the past 24 hours. With worsening symptoms and cancer history plan for MRI to look for significant pathology. Discussed continued outpatient follow-up with pain medicine and orthopedics if no acute findings on the MRI.  MRI results reviewed new acute lumbar disc herniation on the right side which correlates with patient's worsening symptoms. Patient hard multiple IV doses of narcotics and minimal improvement on recheck. Patient came to light however significant pain while doing so. Page orthopedics and discussed with [facility] who agreed with admission. Patient has no focal neuro deficits in the ER, clinically sciatica with disc herniation.  The patients results and plan were reviewed and discussed.   Any x-rays performed were personally reviewed by myself.   Differential diagnosis were considered with the presenting HPI.  Medications  ondansetron (ZOFRAN) injection 4 mg (not administered)  HYDROmorphone (DILAUDID) injection 0.5 mg (not administered)  HYDROmorphone (DILAUDID) injection 1 mg (1 mg Intravenous Given 10/15/14 1001)  ketorolac (TORADOL) 15 MG/ML injection 15 mg (15 mg Intravenous Given 10/15/14 1051)  HYDROmorphone (DILAUDID) injection 1 mg (1 mg Intravenous Given 10/15/14 1120)  HYDROmorphone (DILAUDID) injection 1 mg (1 mg Intravenous Given 10/15/14 1339)    Filed Vitals:   10/15/14 0937 10/15/14 1231  BP: 145/89 129/71  Pulse: 81 72  Temp: 98.5 F (36.9 C) 98.2 F (36.8 C)  TempSrc: Oral Oral  Resp: 18 16  SpO2:  98% 96%    Final diagnoses:  Acute back pain    Admission/ observation were discussed with the admitting physician, patient and/or family and they are comfortable with the plan.       Mariea Clonts, MD 10/15/14 [phone]

## 2014-10-15 NOTE — Plan of Care (Signed)
Problem: Phase I Progression Outcomes Goal: Pain controlled with appropriate interventions Outcome: Progressing Goal: Voiding-avoid urinary catheter unless indicated Outcome: Completed/Met Date Met:  10/15/14  Problem: Phase II Progression Outcomes Goal: Vital signs remain stable Outcome: Progressing

## 2014-10-15 NOTE — ED Notes (Signed)
Ambulated with the assistance of crutches

## 2014-10-16 ENCOUNTER — Ambulatory Visit: Payer: Self-pay | Admitting: Cardiovascular Disease

## 2014-10-16 DIAGNOSIS — H8110 Benign paroxysmal vertigo, unspecified ear: Secondary | ICD-10-CM

## 2014-10-16 LAB — CBC
HCT: 34.5 % — ABNORMAL LOW (ref 39.0–52.0)
Hemoglobin: 11 g/dL — ABNORMAL LOW (ref 13.0–17.0)
MCH: 29.8 pg (ref 26.0–34.0)
MCHC: 31.9 g/dL (ref 30.0–36.0)
MCV: 93.5 fL (ref 78.0–100.0)
Platelets: 289 10*3/uL (ref 150–400)
RBC: 3.69 MIL/uL — ABNORMAL LOW (ref 4.22–5.81)
RDW: 15.6 % — AB (ref 11.5–15.5)
WBC: 7.5 10*3/uL (ref 4.0–10.5)

## 2014-10-16 LAB — COMPREHENSIVE METABOLIC PANEL
ALK PHOS: 58 U/L (ref 39–117)
ALT: 25 U/L (ref 0–53)
ANION GAP: 12 (ref 5–15)
AST: 21 U/L (ref 0–37)
Albumin: 3.9 g/dL (ref 3.5–5.2)
BILIRUBIN TOTAL: 0.4 mg/dL (ref 0.3–1.2)
BUN: 18 mg/dL (ref 6–23)
CHLORIDE: 98 meq/L (ref 96–112)
CO2: 28 meq/L (ref 19–32)
Calcium: 9.1 mg/dL (ref 8.4–10.5)
Creatinine, Ser: 1.07 mg/dL (ref 0.50–1.35)
GFR calc non Af Amer: 75 mL/min — ABNORMAL LOW (ref >90)
GFR, EST AFRICAN AMERICAN: 87 mL/min — AB (ref >90)
GLUCOSE: 135 mg/dL — AB (ref 70–99)
Potassium: 4.2 mEq/L (ref 3.7–5.3)
SODIUM: 138 meq/L (ref 137–147)
TOTAL PROTEIN: 7.3 g/dL (ref 6.0–8.3)

## 2014-10-16 LAB — GLUCOSE, CAPILLARY
GLUCOSE-CAPILLARY: 130 mg/dL — AB (ref 70–99)
Glucose-Capillary: 120 mg/dL — ABNORMAL HIGH (ref 70–99)

## 2014-10-16 MED ORDER — HYDROMORPHONE HCL 2 MG PO TABS: 2.0000 mg | ORAL_TABLET | ORAL | Status: DC | PRN

## 2014-10-16 MED ORDER — OXYCODONE HCL 5 MG PO TABS: 10.0000 mg | ORAL_TABLET | ORAL | Status: DC | PRN

## 2014-10-16 MED ORDER — DOCUSATE SODIUM 100 MG PO CAPS
100.0000 mg | ORAL_CAPSULE | Freq: Two times a day (BID) | ORAL | Status: DC
  Administered 2014-10-16: 100 mg via ORAL

## 2014-10-16 NOTE — Progress Notes (Signed)
Patient ID: Steven Hale, male   DOB: 1955/12/24, 58 y.o.   MRN: [id number] [facility] PROGRESS NOTE  Steven Hale NFA:[id number] DOB: [birth year] DOA: 10/15/2014 PCP: Angelica Chessman, MD  Brief narrative:    58 year old male with past medical history of pancreatic ca (no evidence of recurrence), CAD, hypertensin, dyslipidemia, chronic back pain who presented to [facility] ED with intractable low back pain for past several days prior to this admission. Pain was 10/10 in intensity and not relieved with home analgesics such as percocet. Patient was seen in ED on 11/27/205 for same symptoms but subsequently discharged home since no acute pathology seen. Pt has difficulty ambulation due to severity of the pain. No falls.   In ED, vitals were stable. No significant abnormalities on blood work. MRI lumbar spine showed new right L4-L5 lateral recess/proximal right L4 foraminal disc extrusion, multifactorial right L4 foraminal stenosis, no acute fracture and other findings are stable.   Assessment/Plan:     Principal Problem: Chronic back pain / history of lumbar disc herniation with radiculopathy / foraminal stenosis  - MRI lumbar spine showed new right L4-L5 lateral recess/proximal right L4 foraminal disc extrusion, multifactorial right L4 foraminal stenosis, no acute fracture and other findings are stable. Paged neurosurgery for input on management. - pain management with dilaudid 2 mg IV every 2 hours as needed for severe pain, percocet PO Q 4 hours PRN moderate pain - PT evaluation - pending  Active Problems:  Hyperlipidemia - continue statin therapy   Essential hypertension - continue lisinopril and metoprolol   CAD- multiple PCIs, CABG 2011, low risk Myoview 05/2012 - stable, no complaints of chest pain - continue aspirin and plavix   Claudication / PAD (peripheral artery disease) / PVD (peripheral vascular disease) - continue aspirin, plavix and gabapentin   Benign paroxysmal  positional vertigo - meclizine PRN  Testosterone deficiency - continue daily prednisone   Iron deficiency anemia - hemoglobin stable at 11 - no current indications for transfusion   H/O pancreatic cancer 2009 - no evidence of recurrence   DVT prophylaxis:  - pt on aspirin, plavix and SCD's bilaterally    Code Status: Full Family Communication: Plan of care discussed with the patient  Disposition Plan: Admit for further evaluation   IV access:   Peripheral IV  Procedures and diagnostic studies:    Dg Lumbar Spine Complete 10/12/2014  No acute fracture or subluxation. Degenerative changes at L4-L5 and L5-S1 level.    Mr Lumbar Spine Wo Contrast 10/15/2014  1. Truncated and motion degraded exam, but with a new right L4-L5 lateral recess/proximal right L4 foraminal disc extrusion evident on series 3, image 4. This most affects the course of the exiting right L4 nerve, and exacerbates pre-existing multifactorial right L4 foraminal stenosis. 2. Lumbar thecal sac patency otherwise stable since April.     Medical Consultants:   Neurosurgery   Other Consultants:   Physical therapy    IAnti-Infectives:    None    Leisa Lenz, MD  [facility] Pager [phone]  If 7PM-7AM, please contact night-coverage [email or web address] Password [email or web address] 10/16/2014, 1:13 PM   LOS: 1 day    HPI/Subjective: No acute overnight events.  Objective: Filed Vitals:   10/15/14 1231 10/15/14 1500 10/15/14 2128 10/16/14 0531  BP: 129/71  137/88 145/90  Pulse: 72  71 62  Temp: 98.2 F (36.8 C)  97.6 F (36.4 C) 98.4 F (36.9 C)  TempSrc: Oral  Oral Oral  Resp: 16  17 16   Height:  5\' 9"  (1.753 m)    Weight:  100.699 kg (222 lb)    SpO2: 96%  98% 99%    Intake/Output Summary (Last 24 hours) at 10/16/14 1313 Last data filed at 10/16/14 1025  Gross per 24 hour  Intake 1732.5 ml  Output   1200 ml  Net  532.5 ml    Exam:   General:  Pt is alert, follows commands appropriately,  not in acute distress  Cardiovascular: Regular rate and rhythm, S1/S2, no murmurs  Respiratory: Clear to auscultation bilaterally, no wheezing, no crackles, no rhonchi  Abdomen: Soft, non tender, non distended, bowel sounds present  Extremities: No edema, pulses DP and PT palpable bilaterally  Neuro: Grossly nonfocal  Data Reviewed: Basic Metabolic Panel:  Recent Labs Lab 10/15/14 1333 10/15/14 1334 10/16/14 0457  NA  --  140 138  K  --  5.2 4.2  CL  --  100 98  CO2  --  24 28  GLUCOSE  --  171* 135*  BUN  --  17 18  CREATININE  --  0.99 1.07  CALCIUM  --  10.3 9.1  MG 2.7*  --   --   PHOS 3.9  --   --    Liver Function Tests:  Recent Labs Lab 10/15/14 1334 10/16/14 0457  AST 35 21  ALT 31 25  ALKPHOS 66 58  BILITOT 0.6 0.4  PROT 8.4* 7.3  ALBUMIN 4.6 3.9   No results for input(s): LIPASE, AMYLASE in the last 168 hours. No results for input(s): AMMONIA in the last 168 hours. CBC:  Recent Labs Lab 10/15/14 1334 10/16/14 0457  WBC 7.0 7.5  NEUTROABS 4.7  --   HGB 12.1* 11.0*  HCT 38.2* 34.5*  MCV 94.6 93.5  PLT 286 289   Cardiac Enzymes: No results for input(s): CKTOTAL, CKMB, CKMBINDEX, TROPONINI in the last 168 hours. BNP: Invalid input(s): POCBNP CBG:  Recent Labs Lab 10/15/14 2145 10/16/14 0725  GLUCAP 130* 120*    No results found for this or any previous visit (from the past 240 hour(s)).   Scheduled Meds: . allopurinol  100 mg Oral Daily  . aspirin EC  81 mg Oral Daily  . atorvastatin  20 mg Oral Daily  . clopidogrel  75 mg Oral Daily  . diclofenac  75 mg Oral BID  . docusate sodium  100 mg Oral BID  . gabapentin  600 mg Oral TID  . lisinopril  2.5 mg Oral Daily  . metoprolol tartrate  25 mg Oral BID  . multivitamin with minerals  1 tablet Oral Daily  . omega-3 acid ethyl esters  1 g Oral Daily  . predniSONE  50 mg Oral Q breakfast  . vitamin B-12  100 mcg Oral Daily   Continuous Infusions: . sodium chloride 50 mL/hr at  10/15/14 1545

## 2014-10-16 NOTE — Care Management Note (Signed)
    Page 1 of 1   10/16/2014     2:55:38 PM CARE MANAGEMENT NOTE 10/16/2014  Patient:  DEONDRA, WIGGER   Account Number:  [id number]  Date Initiated:  10/16/2014  Documentation initiated by:  [facility]  Subjective/Objective Assessment:   adm: back pain       Action/Plan:   discharge planning   Anticipated DC Date:  10/16/2014   Anticipated DC Plan:  [place]  CM consult      Choice offered to / List presented to:             Status of service:  Completed, signed off Medicare Important Message given?   (If response is "NO", the following Medicare IM given date fields will be blank) Date Medicare IM given:   Medicare IM given by:   Date Additional Medicare IM given:   Additional Medicare IM given by:    Discharge Disposition:  HOME/SELF CARE  Per UR Regulation:    If discussed at Long Length of Stay Meetings, dates discussed:    Comments:  10/16/14 13:55 CM met with pt in room to discuss discharge needs.  Pt states he will defer his [place] needs until after surgery (which has not been planned as of yet) as he is going to try medication before he resorts to surgery.  No other CM needs were communicated. CM called CSW to notify of pt decision.  Mariane Masters, BSN, CM [phone].

## 2014-10-16 NOTE — Progress Notes (Signed)
Clinical Social Work Department BRIEF PSYCHOSOCIAL ASSESSMENT 10/16/2014  Patient:  Steven Hale, Steven Hale     Account Number:  [id number]     Admit date:  10/15/2014  Clinical Social Worker:  Lacie Scotts  Date/Time:  10/16/2014 01:31 PM  Referred by:  Physician  Date Referred:  10/16/2014 Referred for  SNF Placement   Other Referral:   Interview type:  Patient Other interview type:    PSYCHOSOCIAL DATA Living Status:  ALONE Admitted from facility:   Level of care:   Primary support name:  Dell Ponto Primary support relationship to patient:  PARENT Degree of support available:   limited support    CURRENT CONCERNS Current Concerns  Post-Acute Placement   Other Concerns:    SOCIAL WORK ASSESSMENT / PLAN Pt is a 58 yr old gentleman admitted to [facility] on 11/30 with intractable low back pain. CSW consulted for SNF placement. PN reviewed. PT Eval is pending. Pain is being managed with IV dilaudid and PO percocet. Pt had a work related accident in 2013 and has been dealing with back pain ever since. " The pain is so bad sometimes I can barely get around on my crutches."  Pt states past treatment he has received only relieved the pain temporarily. He is hoping a long last treatment will be available. Pt states he is waiting for recommendations from Ortho. D/c plans have been discussed. Pt lives alone with minimal support available. Pt will consider all recommendations provided by Ortho / PT. CSW will return to assist with d/c planning once recommendations are available.   Assessment/plan status:  Psychosocial Support/Ongoing Assessment of Needs Other assessment/ plan:   Information/referral to community resources:   Pt is covered by [employer] All d/c plans must be approved by CM Peter Congo [phone] ). CM will be contacted once recommendations are available.    PATIENT'S/FAMILY'S RESPONSE TO PLAN OF CARE: Pt is hopeful treatment will be available to provide long lasting relief from  his back pain. He has received OUT PT therapy in the past.Pt will consider rehab placement if recommended by MD / PT and covered by [employer]. D/c planning is ongoing.   Werner Lean LCSW [phone]

## 2014-10-16 NOTE — Progress Notes (Signed)
Pt d/c home. Prescriptions for pain medication given. This very kind pt verbalized frustration with admission. He stated that no one is helping him, that he is only being given pain medicine, which only helps for a short time. I clarified follow up instructions with Dr. Charlies Silvers, who spoke with the patient. Patient to make an appointment with neurosurgeon to discuss surgical options.

## 2014-10-16 NOTE — Discharge Instructions (Signed)
Back Pain, Adult °Back pain is very common. The pain often gets better over time. The cause of back pain is usually not dangerous. Most people can learn to manage their back pain on their own.  °HOME CARE  °· Stay active. Start with short walks on flat ground if you can. Try to walk farther each day. °· Do not sit, drive, or stand in one place for more than 30 minutes. Do not stay in bed. °· Do not avoid exercise or work. Activity can help your back heal faster. °· Be careful when you bend or lift an object. Bend at your knees, keep the object close to you, and do not twist. °· Sleep on a firm mattress. Lie on your side, and bend your knees. If you lie on your back, put a pillow under your knees. °· Only take medicines as told by your doctor. °· Put ice on the injured area. °¨ Put ice in a plastic bag. °¨ Place a towel between your skin and the bag. °¨ Leave the ice on for 15-20 minutes, 03-04 times a day for the first 2 to 3 days. After that, you can switch between ice and heat packs. °· Ask your doctor about back exercises or massage. °· Avoid feeling anxious or stressed. Find good ways to deal with stress, such as exercise. °GET HELP RIGHT AWAY IF:  °· Your pain does not go away with rest or medicine. °· Your pain does not go away in 1 week. °· You have new problems. °· You do not feel well. °· The pain spreads into your legs. °· You cannot control when you poop (bowel movement) or pee (urinate). °· Your arms or legs feel weak or lose feeling (numbness). °· You feel sick to your stomach (nauseous) or throw up (vomit). °· You have belly (abdominal) pain. °· You feel like you may pass out (faint). °MAKE SURE YOU:  °· Understand these instructions. °· Will watch your condition. °· Will get help right away if you are not doing well or get worse. °Document Released: 04/20/2008 Document Revised: 01/25/2012 Document Reviewed: 03/06/2014 °ExitCare® Patient Information ©2015 [employer]. This information is not intended  to replace advice given to you by your health care provider. Make sure you discuss any questions you have with your health care provider. ° °

## 2014-10-16 NOTE — Discharge Summary (Signed)
[facility] PROGRESS NOTE  Steven Hale GYI:[id number] DOB: 11/14/1956 DOA: 10/15/2014 PCP: Angelica Chessman, MD  Brief narrative:    58 year old male with past medical history of pancreatic ca (no evidence of recurrence), CAD, hypertensin, dyslipidemia, chronic back pain who presented to [facility] ED with intractable low back pain for past several days prior to this admission. Pain was 10/10 in intensity and not relieved with home analgesics such as percocet. Patient was seen in ED on 11/27/205 for same symptoms but subsequently discharged home since no acute pathology seen. Pt has difficulty ambulation due to severity of the pain. No falls.   In ED, vitals were stable. No significant abnormalities on blood work. MRI lumbar spine showed new right L4-L5 lateral recess/proximal right L4 foraminal disc extrusion, multifactorial right L4 foraminal stenosis, no acute fracture and other findings are stable.   Assessment/Plan:     Principal Problem:  Chronic back pain / history of lumbar disc herniation with radiculopathy - MRI lumbar spine showed new right L4-L5 lateral recess/proximal right L4 foraminal disc extrusion, multifactorial right L4 foraminal stenosis, no acute fracture and other findings are stable.  - pain was managed with dilaudid 2 mg IV every 2 hours as needed for severe pain, percocet PO Q 4 hours PRN moderate pain. His pain is 2-3/10 this am - i spoke with neurosurgery Dr. Kathyrn Sheriff who said most certainly pt has legitimate pain due to stenosis as seen on MRI but does not require urgent surgery and can follow up in his office in 1 week to discuss options for treatment.  - PT evaluation - HHPT ordered  Active Problems:  Hyperlipidemia - continue statin therapy   Essential hypertension - continue lisinopril and metoprolol   CAD- multiple PCIs, CABG 2011, low risk Myoview 05/2012 - stable, no complaints of chest pain - continue aspirin and plavix   Claudication /  PAD (peripheral artery disease) / PVD (peripheral vascular disease) - continue aspirin, plavix and gabapentin   Benign paroxysmal positional vertigo - meclizine PRN  Testosterone deficiency - continue daily prednisone   Iron deficiency anemia - hemoglobin stable at 12.1  - no current indications for transfusion   H/O pancreatic cancer 2009 - no evidence of recurrence   DVT prophylaxis:  - pt on aspirin, plavix and SCD's bilaterally    Code Status: Full Family Communication: Plan of care discussed with the patient     IV access:   Peripheral IV  Procedures and diagnostic studies:    Dg Lumbar Spine Complete 10/12/2014   No acute fracture or subluxation. Degenerative changes at L4-L5 and L5-S1 level.   Electronically Signed   By: Lahoma Crocker M.D.   On: 10/12/2014 15:50   Mr Lumbar Spine Wo Contrast 10/15/2014  1. Truncated and motion degraded exam, but with a new right L4-L5 lateral recess/proximal right L4 foraminal disc extrusion evident on series 3, image 4. This most affects the course of the exiting right L4 nerve, and exacerbates pre-existing multifactorial right L4 foraminal stenosis. 2. Lumbar thecal sac patency otherwise stable since April.   Electronically Signed   By: Lars Pinks M.D.   On: 10/15/2014 11:58    Medical Consultants:   Neurosurgery - phone call only Dr. Kathyrn Sheriff  Other Consultants:   Physical therapy  IAnti-Infectives:    None   Leisa Lenz, MD  [facility] Pager [phone]  If 7PM-7AM, please contact night-coverage [email or web address] Password [email or web address] 10/16/2014, 2:06 PM   LOS: 1 day    HPI/Subjective: No  acute overnight events.  Objective: Filed Vitals:   10/15/14 1231 10/15/14 1500 10/15/14 2128 10/16/14 0531  BP: 129/71  137/88 145/90  Pulse: 72  71 62  Temp: 98.2 F (36.8 C)  97.6 F (36.4 C) 98.4 F (36.9 C)  TempSrc: Oral  Oral Oral  Resp: 16  17 16   Height:  5\' 9"  (1.753 m)    Weight:  100.699 kg (222 lb)    SpO2:  96%  98% 99%    Intake/Output Summary (Last 24 hours) at 10/16/14 1406 Last data filed at 10/16/14 1025  Gross per 24 hour  Intake 1732.5 ml  Output   1200 ml  Net  532.5 ml    Exam:   General:  Pt is alert, follows commands appropriately, not in acute distress  Cardiovascular: Regular rate and rhythm, S1/S2 appreciated   Respiratory: Clear to auscultation bilaterally, no wheezing, no crackles, no rhonchi  Abdomen: Soft, non tender, non distended, bowel sounds present  Extremities: No edema, pulses DP and PT palpable bilaterally; has back pain and right hip pain  Neuro: Grossly nonfocal  Data Reviewed: Basic Metabolic Panel:  Recent Labs Lab 10/15/14 1333 10/15/14 1334 10/16/14 0457  NA  --  140 138  K  --  5.2 4.2  CL  --  100 98  CO2  --  24 28  GLUCOSE  --  171* 135*  BUN  --  17 18  CREATININE  --  0.99 1.07  CALCIUM  --  10.3 9.1  MG 2.7*  --   --   PHOS 3.9  --   --    Liver Function Tests:  Recent Labs Lab 10/15/14 1334 10/16/14 0457  AST 35 21  ALT 31 25  ALKPHOS 66 58  BILITOT 0.6 0.4  PROT 8.4* 7.3  ALBUMIN 4.6 3.9   No results for input(s): LIPASE, AMYLASE in the last 168 hours. No results for input(s): AMMONIA in the last 168 hours. CBC:  Recent Labs Lab 10/15/14 1334 10/16/14 0457  WBC 7.0 7.5  NEUTROABS 4.7  --   HGB 12.1* 11.0*  HCT 38.2* 34.5*  MCV 94.6 93.5  PLT 286 289   Cardiac Enzymes: No results for input(s): CKTOTAL, CKMB, CKMBINDEX, TROPONINI in the last 168 hours. BNP: Invalid input(s): POCBNP CBG:  Recent Labs Lab 10/15/14 2145 10/16/14 0725  GLUCAP 130* 120*    No results found for this or any previous visit (from the past 240 hour(s)).   Scheduled Meds: . allopurinol  100 mg Oral Daily  . aspirin EC  81 mg Oral Daily  . atorvastatin  20 mg Oral Daily  . clopidogrel  75 mg Oral Daily  . diclofenac  75 mg Oral BID  . docusate sodium  100 mg Oral BID  . gabapentin  600 mg Oral TID  . lisinopril   2.5 mg Oral Daily  . metoprolol tartrate  25 mg Oral BID  . multivitamin with minerals  1 tablet Oral Daily  . omega-3 acid ethyl esters  1 g Oral Daily  . predniSONE  50 mg Oral Q breakfast  . vitamin B-12  100 mcg Oral Daily   Continuous Infusions: . sodium chloride 50 mL/hr at 10/15/14 1545

## 2014-10-17 ENCOUNTER — Encounter: Payer: Self-pay | Admitting: *Deleted

## 2014-10-22 ENCOUNTER — Emergency Department (HOSPITAL_COMMUNITY)
Admission: EM | Admit: 2014-10-22 | Discharge: 2014-10-22 | Disposition: A | Discharge status: Home / Self Care | Payer: Medicare HMO | Attending: Emergency Medicine | Admitting: Emergency Medicine

## 2014-10-22 ENCOUNTER — Encounter (HOSPITAL_COMMUNITY): Payer: Self-pay | Admitting: Emergency Medicine

## 2014-10-22 DIAGNOSIS — Z87438 Personal history of other diseases of male genital organs: Secondary | ICD-10-CM | POA: Insufficient documentation

## 2014-10-22 DIAGNOSIS — Z7982 Long term (current) use of aspirin: Secondary | ICD-10-CM | POA: Insufficient documentation

## 2014-10-22 DIAGNOSIS — M545 Low back pain: Secondary | ICD-10-CM | POA: Diagnosis not present

## 2014-10-22 DIAGNOSIS — Z955 Presence of coronary angioplasty implant and graft: Secondary | ICD-10-CM | POA: Insufficient documentation

## 2014-10-22 DIAGNOSIS — I2510 Atherosclerotic heart disease of native coronary artery without angina pectoris: Secondary | ICD-10-CM | POA: Insufficient documentation

## 2014-10-22 DIAGNOSIS — Z791 Long term (current) use of non-steroidal anti-inflammatories (NSAID): Secondary | ICD-10-CM | POA: Diagnosis not present

## 2014-10-22 DIAGNOSIS — Z87891 Personal history of nicotine dependence: Secondary | ICD-10-CM | POA: Insufficient documentation

## 2014-10-22 DIAGNOSIS — I10 Essential (primary) hypertension: Secondary | ICD-10-CM | POA: Diagnosis not present

## 2014-10-22 DIAGNOSIS — D649 Anemia, unspecified: Secondary | ICD-10-CM | POA: Insufficient documentation

## 2014-10-22 DIAGNOSIS — Z8619 Personal history of other infectious and parasitic diseases: Secondary | ICD-10-CM | POA: Insufficient documentation

## 2014-10-22 DIAGNOSIS — Z79899 Other long term (current) drug therapy: Secondary | ICD-10-CM | POA: Insufficient documentation

## 2014-10-22 DIAGNOSIS — M549 Dorsalgia, unspecified: Secondary | ICD-10-CM

## 2014-10-22 DIAGNOSIS — Z8669 Personal history of other diseases of the nervous system and sense organs: Secondary | ICD-10-CM | POA: Insufficient documentation

## 2014-10-22 DIAGNOSIS — Z7902 Long term (current) use of antithrombotics/antiplatelets: Secondary | ICD-10-CM | POA: Insufficient documentation

## 2014-10-22 DIAGNOSIS — Z8507 Personal history of malignant neoplasm of pancreas: Secondary | ICD-10-CM | POA: Insufficient documentation

## 2014-10-22 DIAGNOSIS — Z9889 Other specified postprocedural states: Secondary | ICD-10-CM | POA: Diagnosis not present

## 2014-10-22 DIAGNOSIS — Z8719 Personal history of other diseases of the digestive system: Secondary | ICD-10-CM | POA: Insufficient documentation

## 2014-10-22 DIAGNOSIS — E785 Hyperlipidemia, unspecified: Secondary | ICD-10-CM | POA: Insufficient documentation

## 2014-10-22 MED ORDER — HYDROMORPHONE HCL 2 MG/ML IJ SOLN
2.0000 mg | Freq: Once | INTRAMUSCULAR | Status: AC
  Administered 2014-10-22: 2 mg via INTRAMUSCULAR

## 2014-10-22 MED ORDER — HYDROMORPHONE HCL 2 MG/ML IJ SOLN
2.0000 mg | Freq: Once | INTRAMUSCULAR | Status: DC
  Filled 2014-10-22: qty 1

## 2014-10-22 MED ORDER — OXYCODONE HCL 5 MG PO TABS: 5.0000 mg | ORAL_TABLET | ORAL | Status: DC | PRN

## 2014-10-22 NOTE — ED Provider Notes (Signed)
CSN: [id number]     Arrival date & time 10/22/14  0535 History   First MD Initiated Contact with Patient 10/22/14 [phone]     Chief Complaint  Patient presents with  . Back Pain     (Consider location/radiation/quality/duration/timing/severity/associated sxs/prior Treatment) The history is provided by the patient and medical records.   This is a 58 y.o. M with PMH significant for HTN, hLP, CAD, DJD, newly diagnosed L4-L5 disc herniation with nerve impingement, presenting to the ED for back pain.  Patient denies new injuries, trauma, or falls.  States pain starts in his right lower back with radiation down entire right leg.  States there is always a dull ache present but has intermittent sharp, stabbing pains that "bring him to his knees".  Denies numbness or weakness but states some intermittent paresthesias when sharp pains occur.  No loss of bowel or bladder control.  Patient was admitted last week for pain control, was given neurosurgery appt but has yet to follow-up.  No new fever, chills, sweats.  Patient has been using his crutches to help his ambulate and keep weight off of his right leg.  He has since run out of pain medication-- states the pharmacy told him they could not refill his percocet for another 2 days because they ran out.  No chest pain, SOB, abdominal pain.  Past Medical History  Diagnosis Date  . PAD (peripheral artery disease)     a. s/p bilat SFA stents;  b. ABIs 11/2012: R 0.99, L 0.86.  [name] Hypertension   . Hyperlipidemia   . Anemia, unspecified   . GERD (gastroesophageal reflux disease)   . Helicobacter pylori (H. pylori) infection   . Hemorrhoids   . Impotence of organic origin   . Bell's palsy   . CAD (coronary artery disease)     a. s/p multiple PCIs;  b. s/p CABG in 09/2010 (LIMA-LAD, SVG-OM1, SVG-distal RCA/OM2);  c.  Myoview 05/2012: EF 54%, no ischemia, no scar.   . Blood transfusion     during treatment for Ca  . DJD (degenerative joint disease)     low back  & all over   . Pancreatic cancer     surgery 2009   Past Surgical History  Procedure Laterality Date  . Pancreatic  cancer  05/10/2008    Resection of distal panrease and spleen  . Splenectomy    . Coronary artery bypass graft  nov 2011    x 4  . Back surgery      1992  . Peripheral vascular catheterization  Oct 2014    Lt SFA PTA  . Hemorrhoid surgery  10/30/2011    Procedure: HEMORRHOIDECTOMY;  Surgeon: Harl Bowie, MD;  Location: [facility];  Service: General;  Laterality: N/A;  . Peripheral vascular catheterization  Oct 2015    ISR Lt SFA-PTA   Family History  Problem Relation Age of Onset  . Heart attack Father     died of MI at age 77  . Anesthesia problems Neg Hx   . Hypotension Neg Hx   . Malignant hyperthermia Neg Hx   . Pseudochol deficiency Neg Hx   . Colon cancer Neg Hx   . Colon polyps Neg Hx   . Diabetes Neg Hx   . Kidney disease Neg Hx   . Esophageal cancer Neg Hx    History  Substance Use Topics  . Smoking status: Former Smoker    Types: Cigarettes    Quit date: 11/29/2008  .  Smokeless tobacco: Never Used  . Alcohol Use: 1.2 oz/week    2 Glasses of wine per week     Comment: everyday    Review of Systems  Musculoskeletal: Positive for back pain.  All other systems reviewed and are negative.     Allergies  Review of patient's allergies indicates no known allergies.  Home Medications   Prior to Admission medications   Medication Sig Start Date End Date Taking? Authorizing Provider  allopurinol (ZYLOPRIM) 100 MG tablet Take 100 mg by mouth daily.  09/14/14  Yes Historical Provider, MD  aspirin EC 81 MG tablet Take 81 mg by mouth daily.     Yes Historical Provider, MD  atorvastatin (LIPITOR) 40 MG tablet Take 0.5 tablets (20 mg total) by mouth daily. 05/31/14  Yes Lelon Perla, MD  clopidogrel (PLAVIX) 75 MG tablet Take 1 tablet (75 mg total) by mouth daily. 05/16/14  Yes Wellington Hampshire, MD  diclofenac (VOLTAREN) 75 MG EC tablet Take 75  mg by mouth 2 (two) times daily.   Yes Historical Provider, MD  fish oil-omega-3 fatty acids 1000 MG capsule Take 1 capsule (1 g total) by mouth daily. 11/30/12  Yes Rosana Hoes, MD  gabapentin (NEURONTIN) 300 MG capsule Take 600 mg by mouth 3 (three) times daily.    Yes Historical Provider, MD  HYDROmorphone (DILAUDID) 2 MG tablet Take 1 tablet (2 mg total) by mouth every 4 (four) hours as needed for severe pain (take only if oxycodone not adequately controlling pain). 10/16/14  Yes Robbie Lis, MD  lisinopril (PRINIVIL,ZESTRIL) 5 MG tablet Take 2.5 mg by mouth daily.   Yes Historical Provider, MD  metoprolol tartrate (LOPRESSOR) 25 MG tablet Take 25 mg by mouth 2 (two) times daily.   Yes Historical Provider, MD  Multiple Vitamin (MULTIVITAMIN) tablet Take 1 tablet by mouth daily. 11/30/12  Yes Rosana Hoes, MD  nitroGLYCERIN (NITROSTAT) 0.4 MG SL tablet Place 1 tablet (0.4 mg total) under the tongue every 5 (five) minutes as needed for chest pain. 11/30/12  Yes Rosana Hoes, MD  oxyCODONE (ROXICODONE) 5 MG immediate release tablet Take 2 tablets (10 mg total) by mouth every 4 (four) hours as needed. Take 1-2 tablets every 4-6 hours as needed for pain control 10/16/14  Yes Robbie Lis, MD  Propylene Glycol (SYSTANE BALANCE) 0.6 % SOLN Place 1 drop into both eyes daily.   Yes Historical Provider, MD  tapentadol (NUCYNTA) 50 MG TABS tablet Take 50 mg by mouth every 4 (four) hours as needed for severe pain (pain).    Yes Historical Provider, MD  vitamin B-12 (CYANOCOBALAMIN) 100 MCG tablet Take 100 mcg by mouth daily.   Yes Historical Provider, MD  tadalafil (CIALIS) 20 MG tablet Take 0.5-1 tablets (10-20 mg total) by mouth every other day as needed for erectile dysfunction. 09/13/13   Ripudeep K Rai, MD   BP 148/89 mmHg  Pulse 103  Temp(Src) 97.5 F (36.4 C) (Oral)  Resp 18  Ht 5\' 9"  (1.753 m)  Wt 220 lb (99.791 kg)  BMI 32.47 kg/m2  SpO2 100%   Physical Exam  Constitutional: He is oriented to  person, place, and time. He appears well-developed and well-nourished. No distress.  HENT:  Head: Normocephalic and atraumatic.  Mouth/Throat: Oropharynx is clear and moist.  Eyes: Conjunctivae and EOM are normal. Pupils are equal, round, and reactive to light.  Neck: Normal range of motion. Neck supple.  Cardiovascular: Normal rate, regular rhythm and normal heart sounds.  Pulmonary/Chest: Effort normal and breath sounds normal. No respiratory distress. He has no wheezes.  Abdominal: Soft. Bowel sounds are normal. There is no tenderness. There is no guarding.  Musculoskeletal: Normal range of motion. He exhibits no edema.       Lumbar back: He exhibits tenderness, bony tenderness and pain.  Right lumbar spine TTP without noted deformities; normal strength and sensation of BLE although painful on right; + SLR on right; DP pulses intact bilaterally  Neurological: He is alert and oriented to person, place, and time.  Skin: Skin is warm. He is not diaphoretic.  Psychiatric: He has a normal mood and affect.  Nursing note and vitals reviewed.   ED Course  Procedures (including critical care time) Labs Review Labs Reviewed - No data to display  Imaging Review No results found.   EKG Interpretation None      MDM   Final diagnoses:  Back pain, unspecified location   58 y.o. M with back pain, secondary to recently diagnosed L4-L5 disc herniation with nerve impingement.  On exam, no red flag symptoms or focal neurologic deficits.  Patient currently out of pain meds.  Has yet to follow-up with neurosurgery.  Do not feel patient needs repeat emergent imaging at this time.  Patient given dose of 2mg  IM dilaudid.  Will reassess.  After meds patient states he is feeling much better.  He has still occasionally radiating pains down leg, but nowhere near as severe as before.  Neurologic exam remains non-focal without red flag symptoms to suggest cauda equina or other life threatening pathology.   Patient will be discharged home.  Strongly encouraged to follow-up with neurosurgery.  Short supply oxycodone given.  Discussed plan with patient, he/she acknowledged understanding and agreed with plan of care.  Return precautions given for new or worsening symptoms.  Larene Pickett, PA-C 10/22/14 [place], MD 10/23/14 [phone]

## 2014-10-22 NOTE — Discharge Instructions (Signed)
Take the prescribed medication as directed. Strongly recommend you follow-up with neurosurgery-- call to schedule appt. Return to the ED for new or worsening symptoms.

## 2014-10-22 NOTE — ED Notes (Addendum)
Pt presents with back pain radiating down R leg, pt states he was admitted last weekend with pinched nerve and slipped disk. No new injury, pt diaphoretic in triage, slow unsteady gait with crutches,  Pt states he is out of medication, has not followed up with neurosurgeon yet.

## 2014-10-25 ENCOUNTER — Encounter (HOSPITAL_COMMUNITY): Payer: Self-pay | Admitting: Cardiovascular Disease

## 2014-10-28 ENCOUNTER — Encounter (HOSPITAL_COMMUNITY): Payer: Self-pay | Admitting: *Deleted

## 2014-10-28 ENCOUNTER — Emergency Department (HOSPITAL_COMMUNITY)
Admission: EM | Admit: 2014-10-28 | Discharge: 2014-10-28 | Disposition: A | Discharge status: Home / Self Care | Payer: Medicare HMO | Attending: Emergency Medicine | Admitting: Emergency Medicine

## 2014-10-28 DIAGNOSIS — Z862 Personal history of diseases of the blood and blood-forming organs and certain disorders involving the immune mechanism: Secondary | ICD-10-CM | POA: Insufficient documentation

## 2014-10-28 DIAGNOSIS — M25561 Pain in right knee: Secondary | ICD-10-CM

## 2014-10-28 DIAGNOSIS — Z79899 Other long term (current) drug therapy: Secondary | ICD-10-CM | POA: Diagnosis not present

## 2014-10-28 DIAGNOSIS — I2510 Atherosclerotic heart disease of native coronary artery without angina pectoris: Secondary | ICD-10-CM | POA: Insufficient documentation

## 2014-10-28 DIAGNOSIS — Z8669 Personal history of other diseases of the nervous system and sense organs: Secondary | ICD-10-CM | POA: Insufficient documentation

## 2014-10-28 DIAGNOSIS — Z791 Long term (current) use of non-steroidal anti-inflammatories (NSAID): Secondary | ICD-10-CM | POA: Diagnosis not present

## 2014-10-28 DIAGNOSIS — Z7982 Long term (current) use of aspirin: Secondary | ICD-10-CM | POA: Diagnosis not present

## 2014-10-28 DIAGNOSIS — Z8507 Personal history of malignant neoplasm of pancreas: Secondary | ICD-10-CM | POA: Insufficient documentation

## 2014-10-28 DIAGNOSIS — K219 Gastro-esophageal reflux disease without esophagitis: Secondary | ICD-10-CM | POA: Insufficient documentation

## 2014-10-28 DIAGNOSIS — Z87891 Personal history of nicotine dependence: Secondary | ICD-10-CM | POA: Insufficient documentation

## 2014-10-28 DIAGNOSIS — I10 Essential (primary) hypertension: Secondary | ICD-10-CM | POA: Insufficient documentation

## 2014-10-28 DIAGNOSIS — E785 Hyperlipidemia, unspecified: Secondary | ICD-10-CM | POA: Diagnosis not present

## 2014-10-28 DIAGNOSIS — Z7902 Long term (current) use of antithrombotics/antiplatelets: Secondary | ICD-10-CM | POA: Insufficient documentation

## 2014-10-28 MED ORDER — HYDROMORPHONE HCL 1 MG/ML IJ SOLN
1.0000 mg | Freq: Once | INTRAMUSCULAR | Status: AC
  Administered 2014-10-28: 1 mg via INTRAMUSCULAR
  Filled 2014-10-28: qty 1

## 2014-10-28 NOTE — ED Provider Notes (Signed)
CSN: [id number]     Arrival date & time 10/28/14  1004 History   First MD Initiated Contact with Patient 10/28/14 1101     Chief Complaint  Patient presents with  . Knee Pain     (Consider location/radiation/quality/duration/timing/severity/associated sxs/prior Treatment) HPI Steven Hale is a 58 y.o. male with history of chronic knee and back pain, pancreatic cancer, hypertension, presents to emergency department complaining of right knee pain. Patient states he has severe osteoarthritis in the knee, states that he has a scheduled surgery for knee replacement in 1 month. He reports pain worsening 2 days ago. Denies any injuries. Denies any fever or chills. States he has been going to a sports medicine where he is knee is aspirated and has cortisone shots injected which he states helps. He is requesting his knee to be aspirated today. He is currently taking oxycodone for his pain and using crutches for ambulation.  Past Medical History  Diagnosis Date  . PAD (peripheral artery disease)     a. s/p bilat SFA stents;  b. ABIs 11/2012: R 0.99, L 0.86.  [name] Hypertension   . Hyperlipidemia   . Anemia, unspecified   . GERD (gastroesophageal reflux disease)   . Helicobacter pylori (H. pylori) infection   . Hemorrhoids   . Impotence of organic origin   . Bell's palsy   . CAD (coronary artery disease)     a. s/p multiple PCIs;  b. s/p CABG in 09/2010 (LIMA-LAD, SVG-OM1, SVG-distal RCA/OM2);  c.  Myoview 05/2012: EF 54%, no ischemia, no scar.   . Blood transfusion     during treatment for Ca  . DJD (degenerative joint disease)     low back & all over   . Pancreatic cancer     surgery 2009   Past Surgical History  Procedure Laterality Date  . Pancreatic  cancer  05/10/2008    Resection of distal panrease and spleen  . Splenectomy    . Coronary artery bypass graft  nov 2011    x 4  . Back surgery      1992  . Peripheral vascular catheterization  Oct 2014    Lt SFA PTA  . Hemorrhoid  surgery  10/30/2011    Procedure: HEMORRHOIDECTOMY;  Surgeon: Harl Bowie, MD;  Location: [facility];  Service: General;  Laterality: N/A;  . Peripheral vascular catheterization  Oct 2015    ISR Lt SFA-PTA  . Abdominal aortagram N/A 09/13/2013    Procedure: ABDOMINAL AORTAGRAM;  Surgeon: Wellington Hampshire, MD;  Location: [facility] CATH LAB;  Service: Cardiovascular;  Laterality: N/A;  . Thrombectomy femoral artery Left 09/13/2013    Procedure: THROMBECTOMY FEMORAL ARTERY;  Surgeon: Wellington Hampshire, MD;  Location: [facility] CATH LAB;  Service: Cardiovascular;  Laterality: Left;  . Abdominal aortagram N/A 08/29/2014    Procedure: ABDOMINAL AORTAGRAM;  Surgeon: Wellington Hampshire, MD;  Location: [facility] CATH LAB;  Service: Cardiovascular;  Laterality: N/A;   Family History  Problem Relation Age of Onset  . Heart attack Father     died of MI at age 83  . Anesthesia problems Neg Hx   . Hypotension Neg Hx   . Malignant hyperthermia Neg Hx   . Pseudochol deficiency Neg Hx   . Colon cancer Neg Hx   . Colon polyps Neg Hx   . Diabetes Neg Hx   . Kidney disease Neg Hx   . Esophageal cancer Neg Hx    History  Substance Use Topics  . Smoking  status: Former Smoker    Types: Cigarettes    Quit date: 11/29/2008  . Smokeless tobacco: Never Used  . Alcohol Use: 1.2 oz/week    2 Glasses of wine per week     Comment: everyday    Review of Systems  Constitutional: Negative for fever and chills.  Respiratory: Negative for cough, chest tightness and shortness of breath.   Cardiovascular: Negative for chest pain, palpitations and leg swelling.  Gastrointestinal: Negative for nausea, vomiting, abdominal pain, diarrhea and abdominal distention.  Musculoskeletal: Positive for joint swelling and arthralgias. Negative for neck pain and neck stiffness.  Skin: Negative for rash.  Allergic/Immunologic: Negative for immunocompromised state.  Neurological: Negative for dizziness, weakness, light-headedness, numbness and  headaches.      Allergies  Review of patient's allergies indicates no known allergies.  Home Medications   Prior to Admission medications   Medication Sig Start Date End Date Taking? Authorizing Provider  allopurinol (ZYLOPRIM) 100 MG tablet Take 100 mg by mouth daily.  09/14/14   Historical Provider, MD  aspirin EC 81 MG tablet Take 81 mg by mouth daily.      Historical Provider, MD  atorvastatin (LIPITOR) 40 MG tablet Take 0.5 tablets (20 mg total) by mouth daily. 05/31/14   Lelon Perla, MD  clopidogrel (PLAVIX) 75 MG tablet Take 1 tablet (75 mg total) by mouth daily. 05/16/14   Wellington Hampshire, MD  diclofenac (VOLTAREN) 75 MG EC tablet Take 75 mg by mouth 2 (two) times daily.    Historical Provider, MD  fish oil-omega-3 fatty acids 1000 MG capsule Take 1 capsule (1 g total) by mouth daily. 11/30/12   Rosana Hoes, MD  gabapentin (NEURONTIN) 300 MG capsule Take 600 mg by mouth 3 (three) times daily.     Historical Provider, MD  HYDROmorphone (DILAUDID) 2 MG tablet Take 1 tablet (2 mg total) by mouth every 4 (four) hours as needed for severe pain (take only if oxycodone not adequately controlling pain). 10/16/14   Robbie Lis, MD  lisinopril (PRINIVIL,ZESTRIL) 5 MG tablet Take 2.5 mg by mouth daily.    Historical Provider, MD  metoprolol tartrate (LOPRESSOR) 25 MG tablet Take 25 mg by mouth 2 (two) times daily.    Historical Provider, MD  Multiple Vitamin (MULTIVITAMIN) tablet Take 1 tablet by mouth daily. 11/30/12   Rosana Hoes, MD  nitroGLYCERIN (NITROSTAT) 0.4 MG SL tablet Place 1 tablet (0.4 mg total) under the tongue every 5 (five) minutes as needed for chest pain. 11/30/12   Rosana Hoes, MD  oxyCODONE (OXY IR/ROXICODONE) 5 MG immediate release tablet Take 1 tablet (5 mg total) by mouth every 4 (four) hours as needed for severe pain. 10/22/14   Larene Pickett, PA-C  oxyCODONE (ROXICODONE) 5 MG immediate release tablet Take 2 tablets (10 mg total) by mouth every 4 (four) hours as needed.  Take 1-2 tablets every 4-6 hours as needed for pain control 10/16/14   Robbie Lis, MD  Propylene Glycol (SYSTANE BALANCE) 0.6 % SOLN Place 1 drop into both eyes daily.    Historical Provider, MD  tadalafil (CIALIS) 20 MG tablet Take 0.5-1 tablets (10-20 mg total) by mouth every other day as needed for erectile dysfunction. 09/13/13   Ripudeep Krystal Eaton, MD  tapentadol (NUCYNTA) 50 MG TABS tablet Take 50 mg by mouth every 4 (four) hours as needed for severe pain (pain).     Historical Provider, MD  vitamin B-12 (CYANOCOBALAMIN) 100 MCG tablet Take 100 mcg by  mouth daily.    Historical Provider, MD   BP 119/67 mmHg  Pulse 61  Temp(Src) 98 F (36.7 C) (Oral)  Resp 18  SpO2 99% Physical Exam  Constitutional: He appears well-developed and well-nourished. No distress.  HENT:  Head: Normocephalic and atraumatic.  Eyes: Conjunctivae are normal.  Neck: Neck supple.  Cardiovascular: Normal rate, regular rhythm and normal heart sounds.   Pulmonary/Chest: Effort normal. No respiratory distress. He has no wheezes. He has no rales.  Musculoskeletal:  Mild swelling to the right knee noted. Diffuse tenderness. Knee is not warm to the touch. Full range of motion, pain with full flexion and extension. Negative anterior and posterior drawer signs. Patella tendon is intact  Neurological: He is alert.  Skin: Skin is warm and dry.  Nursing note and vitals reviewed.   ED Course  Procedures (including critical care time) Labs Review Labs Reviewed - No data to display  Imaging Review No results found.   EKG Interpretation None      MDM   Final diagnoses:  Right knee pain    patient with right knee pain, history of chronic osteoarthritis, waiting on knee replacement in 1 month. Here asking for right knee aspiration and injection. At this time, no emergent need for aspiration. No signs of infection. Explained that I do not think aspirating the knee at this time will provide him much relief, also  explained we did not inject with steroids, and I do not think that it is in his best interest given he has had a recent injection of cortisone just a few months ago. Patient received 1 mg of IM Dilaudid. Upon discharge, patient requested refill of his pain medications. Explained to him I will not be prescribing him any narcotics, because he told me earlier he still had oxycodone and he is taking them for pain with no relief. Follow up with his orthopedist as soon as able  Filed Vitals:   10/28/14 1031  BP: 119/67  Pulse: 61  Temp: 98 F (36.7 C)  TempSrc: Oral  Resp: 18  SpO2: 99%     Renold Genta, PA-C 10/28/14 [place], MD 10/28/14 [phone]

## 2014-10-28 NOTE — ED Notes (Signed)
To ED for eval of right knee pain. States he has had his knee drained 4 times in the past cple of weeks. He is to have a knee replacement 11/20/14. No injury. Denies fevers. Min to mod swelling noted to knee

## 2014-10-28 NOTE — ED Notes (Signed)
Pt alert x4 respirtaions easy non labored. Refused wc ambulated with crutches.

## 2014-10-28 NOTE — Discharge Instructions (Signed)
Continue to elevate the knee, ice. Crutches as needed. Follow up with orthopedics tomorrow.   Knee Pain The knee is the complex joint between your thigh and your lower leg. It is made up of bones, tendons, ligaments, and cartilage. The bones that make up the knee are:  The femur in the thigh.  The tibia and fibula in the lower leg.  The patella or kneecap riding in the groove on the lower femur. CAUSES  Knee pain is a common complaint with many causes. A few of these causes are:  Injury, such as:  A ruptured ligament or tendon injury.  Torn cartilage.  Medical conditions, such as:  Gout  Arthritis  Infections  Overuse, over training, or overdoing a physical activity. Knee pain can be minor or severe. Knee pain can accompany debilitating injury. Minor knee problems often respond well to self-care measures or get well on their own. More serious injuries may need medical intervention or even surgery. SYMPTOMS The knee is complex. Symptoms of knee problems can vary widely. Some of the problems are:  Pain with movement and weight bearing.  Swelling and tenderness.  Buckling of the knee.  Inability to straighten or extend your knee.  Your knee locks and you cannot straighten it.  Warmth and redness with pain and fever.  Deformity or dislocation of the kneecap. DIAGNOSIS  Determining what is wrong may be very straight forward such as when there is an injury. It can also be challenging because of the complexity of the knee. Tests to make a diagnosis may include:  Your caregiver taking a history and doing a physical exam.  Routine X-rays can be used to rule out other problems. X-rays will not reveal a cartilage tear. Some injuries of the knee can be diagnosed by:  Arthroscopy a surgical technique by which a small video camera is inserted through tiny incisions on the sides of the knee. This procedure is used to examine and repair internal knee joint problems. Tiny  instruments can be used during arthroscopy to repair the torn knee cartilage (meniscus).  Arthrography is a radiology technique. A contrast liquid is directly injected into the knee joint. Internal structures of the knee joint then become visible on X-ray film.  An MRI scan is a non X-ray radiology procedure in which magnetic fields and a computer produce two- or three-dimensional images of the inside of the knee. Cartilage tears are often visible using an MRI scanner. MRI scans have largely replaced arthrography in diagnosing cartilage tears of the knee.  Blood work.  Examination of the fluid that helps to lubricate the knee joint (synovial fluid). This is done by taking a sample out using a needle and a syringe. TREATMENT The treatment of knee problems depends on the cause. Some of these treatments are:  Depending on the injury, proper casting, splinting, surgery, or physical therapy care will be needed.  Give yourself adequate recovery time. Do not overuse your joints. If you begin to get sore during workout routines, back off. Slow down or do fewer repetitions.  For repetitive activities such as cycling or running, maintain your strength and nutrition.  Alternate muscle groups. For example, if you are a weight lifter, work the upper body on one day and the lower body the next.  Either tight or weak muscles do not give the proper support for your knee. Tight or weak muscles do not absorb the stress placed on the knee joint. Keep the muscles surrounding the knee strong.  Take  care of mechanical problems.  If you have flat feet, orthotics or special shoes may help. See your caregiver if you need help.  Arch supports, sometimes with wedges on the inner or outer aspect of the heel, can help. These can shift pressure away from the side of the knee most bothered by osteoarthritis.  A brace called an "unloader" brace also may be used to help ease the pressure on the most arthritic side of the  knee.  If your caregiver has prescribed crutches, braces, wraps or ice, use as directed. The acronym for this is PRICE. This means protection, rest, ice, compression, and elevation.  Nonsteroidal anti-inflammatory drugs (NSAIDs), can help relieve pain. But if taken immediately after an injury, they may actually increase swelling. Take NSAIDs with food in your stomach. Stop them if you develop stomach problems. Do not take these if you have a history of ulcers, stomach pain, or bleeding from the bowel. Do not take without your caregiver's approval if you have problems with fluid retention, heart failure, or kidney problems.  For ongoing knee problems, physical therapy may be helpful.  Glucosamine and chondroitin are over-the-counter dietary supplements. Both may help relieve the pain of osteoarthritis in the knee. These medicines are different from the usual anti-inflammatory drugs. Glucosamine may decrease the rate of cartilage destruction.  Injections of a corticosteroid drug into your knee joint may help reduce the symptoms of an arthritis flare-up. They may provide pain relief that lasts a few months. You may have to wait a few months between injections. The injections do have a small increased risk of infection, water retention, and elevated blood sugar levels.  Hyaluronic acid injected into damaged joints may ease pain and provide lubrication. These injections may work by reducing inflammation. A series of shots may give relief for as long as 6 months.  Topical painkillers. Applying certain ointments to your skin may help relieve the pain and stiffness of osteoarthritis. Ask your pharmacist for suggestions. Many over the-counter products are approved for temporary relief of arthritis pain.  In some countries, doctors often prescribe topical NSAIDs for relief of chronic conditions such as arthritis and tendinitis. A review of treatment with NSAID creams found that they worked as well as oral  medications but without the serious side effects. PREVENTION  Maintain a healthy weight. Extra pounds put more strain on your joints.  Get strong, stay limber. Weak muscles are a common cause of knee injuries. Stretching is important. Include flexibility exercises in your workouts.  Be smart about exercise. If you have osteoarthritis, chronic knee pain or recurring injuries, you may need to change the way you exercise. This does not mean you have to stop being active. If your knees ache after jogging or playing basketball, consider switching to swimming, water aerobics, or other low-impact activities, at least for a few days a week. Sometimes limiting high-impact activities will provide relief.  Make sure your shoes fit well. Choose footwear that is right for your sport.  Protect your knees. Use the proper gear for knee-sensitive activities. Use kneepads when playing volleyball or laying carpet. Buckle your seat belt every time you drive. Most shattered kneecaps occur in car accidents.  Rest when you are tired. SEEK MEDICAL CARE IF:  You have knee pain that is continual and does not seem to be getting better.  SEEK IMMEDIATE MEDICAL CARE IF:  Your knee joint feels hot to the touch and you have a high fever. MAKE SURE YOU:   Understand these  instructions.  Will watch your condition.  Will get help right away if you are not doing well or get worse. Document Released: 08/30/2007 Document Revised: 01/25/2012 Document Reviewed: 08/30/2007 [facility] Patient Information 2015 [place], [place]. This information is not intended to replace advice given to you by your health care provider. Make sure you discuss any questions you have with your health care provider.

## 2014-10-30 ENCOUNTER — Ambulatory Visit (INDEPENDENT_AMBULATORY_CARE_PROVIDER_SITE_OTHER): Payer: Medicare HMO | Admitting: Sports Medicine

## 2014-10-30 VITALS — BP 123/73 | HR 99 | Ht 69.0 in | Wt 222.0 lb

## 2014-10-30 DIAGNOSIS — M25561 Pain in right knee: Secondary | ICD-10-CM

## 2014-10-30 MED ORDER — TRAMADOL HCL 50 MG PO TABS: ORAL_TABLET | ORAL | Status: DC

## 2014-10-30 MED ORDER — DICLOFENAC SODIUM 75 MG PO TBEC: DELAYED_RELEASE_TABLET | ORAL | Status: DC

## 2014-10-30 NOTE — Patient Instructions (Signed)
Stop your Diclofenac 3 days before surgery

## 2014-10-30 NOTE — Progress Notes (Signed)
   Subjective:    Patient ID: Steven Hale, male    DOB: 1956/10/10, 58 y.o.   MRN: [id number]  HPI chief complaint: Right knee pain  Patient comes in today with returning right knee pain. We previously aspirated and injected his knee. We referred him to rheumatology for a rheumatologic workup which was done and was unremarkable per his report. He ultimately saw Dr.Olin and is scheduled for a unicompartmental replacement for January 4th. He had an acute onset of knee pain a few days ago which led him to seek treatment in the emergency room. He was requesting aspiration but it was noted by the treating physician that there was not an effusion large enough to aspirate. Cortisone injection was not administered either. His pain is diffuse in the knee with radiating discomfort down into his tibia. He has a double upright brace which helps somewhat. He is using crutches as well. No fevers or chills.    Review of Systems     Objective:   Physical Exam Well-developed, well-nourished. No acute distress.  Right knee: 5 extension lag. Flexion to 90. No appreciable effusion. Diffuse tenderness to palpation. No erythema. Joint is not warm to touch. Joint is grossly stable to ligament is exam. Neurovascularly intact distally. Walking with a noticeable limp.       Assessment & Plan:  Returning right knee pain secondary to DJD  Patient is scheduled for a unicompartmental replacement in less than 2 weeks. Cortisone injection would be contraindicated at this time due to his upcoming surgery. Patient has tolerated both diclofenac and tramadol in the past. I've given him prescriptions for both of these today. He is instructed to discontinue his diclofenac 3 days prior to surgery. He will continue with his double upright brace and crutches. Definitive treatment will hopefully be in the form of his unicompartmental replacement. Follow-up with Dr.Olin as scheduled. Follow-up with me prn.

## 2014-11-02 NOTE — Progress Notes (Signed)
Please put orders in Epic surgery 11-19-14 pre op 11-06-14 Thanks

## 2014-11-05 ENCOUNTER — Encounter (HOSPITAL_COMMUNITY): Payer: Self-pay

## 2014-11-05 NOTE — Patient Instructions (Addendum)
Steven Hale  11/05/2014   Your procedure is scheduled on: 11/19/2014   Report to [facility]  Entrance ( either Main Entrance or Emergency Room entrance )  and follow signs to               [facility] at   [place].  Call this number if you have problems the morning of surgery [phone]   Remember:  Do not eat food or drink liquids :After Midnight.     Take these medicines the morning of surgery with A SIP OF WATER:  Metoprolol, percocet if needed                               You may not have any metal on your body including hair pins and              piercings  Do not wear jewelry, , lotions, powders or perfumes.                         Men may shave face and neck.   Do not bring valuables to the hospital. [place].  Contacts, dentures or bridgework may not be worn into surgery.          Special Instructions: coughing and deep breathing exercises, leg exercises               Please read over the following fact sheets you were given: _____________________________________________________________________             [facility] - Preparing for Surgery Before surgery, you can play an important role.  Because skin is not sterile, your skin needs to be as free of germs as possible.  You can reduce the number of germs on your skin by washing with CHG (chlorahexidine gluconate) soap before surgery.  CHG is an antiseptic cleaner which kills germs and bonds with the skin to continue killing germs even after washing. Please DO NOT use if you have an allergy to CHG or antibacterial soaps.  If your skin becomes reddened/irritated stop using the CHG and inform your nurse when you arrive at Short Stay. Do not shave (including legs and underarms) for at least 48 hours prior to the first CHG shower.  You may shave your face/neck. Please follow these instructions carefully:  1.  Shower with CHG Soap the night  before surgery and the  morning of Surgery.  2.  If you choose to wash your hair, wash your hair first as usual with your  normal  shampoo.  3.  After you shampoo, rinse your hair and body thoroughly to remove the  shampoo.                           4.  Use CHG as you would any other liquid soap.  You can apply chg directly  to the skin and wash                       Gently with a scrungie or clean washcloth.  5.  Apply the CHG Soap to your body ONLY FROM THE NECK DOWN.   Do not use on  face/ open                           Wound or open sores. Avoid contact with eyes, ears mouth and genitals (private parts).                       Wash face,  Genitals (private parts) with your normal soap.             6.  Wash thoroughly, paying special attention to the area where your surgery  will be performed.  7.  Thoroughly rinse your body with warm water from the neck down.  8.  DO NOT shower/wash with your normal soap after using and rinsing off  the CHG Soap.                9.  Pat yourself dry with a clean towel.            10.  Wear clean pajamas.            11.  Place clean sheets on your bed the night of your first shower and do not  sleep with pets. Day of Surgery : Do not apply any lotions/deodorants the morning of surgery.  Please wear clean clothes to the hospital/surgery center.  FAILURE TO FOLLOW THESE INSTRUCTIONS MAY RESULT IN THE CANCELLATION OF YOUR SURGERY PATIENT SIGNATURE_________________________________  NURSE SIGNATURE__________________________________  ________________________________________________________________________   Steven Hale  An incentive spirometer is a tool that can help keep your lungs clear and active. This tool measures how well you are filling your lungs with each breath. Taking long deep breaths may help reverse or decrease the chance of developing breathing (pulmonary) problems (especially infection) following:  A long period of time when you are  unable to move or be active. BEFORE THE PROCEDURE   If the spirometer includes an indicator to show your best effort, your nurse or respiratory therapist will set it to a desired goal.  If possible, sit up straight or lean slightly forward. Try not to slouch.  Hold the incentive spirometer in an upright position. INSTRUCTIONS FOR USE   Sit on the edge of your bed if possible, or sit up as far as you can in bed or on a chair.  Hold the incentive spirometer in an upright position.  Breathe out normally.  Place the mouthpiece in your mouth and seal your lips tightly around it.  Breathe in slowly and as deeply as possible, raising the piston or the ball toward the top of the column.  Hold your breath for 3-5 seconds or for as long as possible. Allow the piston or ball to fall to the bottom of the column.  Remove the mouthpiece from your mouth and breathe out normally.  Rest for a few seconds and repeat Steps 1 through 7 at least 10 times every 1-2 hours when you are awake. Take your time and take a few normal breaths between deep breaths.  The spirometer may include an indicator to show your best effort. Use the indicator as a goal to work toward during each repetition.  After each set of 10 deep breaths, practice coughing to be sure your lungs are clear. If you have an incision (the cut made at the time of surgery), support your incision when coughing by placing a pillow or rolled up towels firmly against it. Once you are able to get out of bed, walk around indoors  and cough well. You may stop using the incentive spirometer when instructed by your caregiver.  RISKS AND COMPLICATIONS  Take your time so you do not get dizzy or light-headed.  If you are in pain, you may need to take or ask for pain medication before doing incentive spirometry. It is harder to take a deep breath if you are having pain. AFTER USE  Rest and breathe slowly and easily.  It can be helpful to keep track of  a log of your progress. Your caregiver can provide you with a simple table to help with this. If you are using the spirometer at home, follow these instructions: Steven Hale IF:   You are having difficultly using the spirometer.  You have trouble using the spirometer as often as instructed.  Your pain medication is not giving enough relief while using the spirometer.  You develop fever of 100.5 F (38.1 C) or higher. SEEK IMMEDIATE MEDICAL CARE IF:   You cough up bloody sputum that had not been present before.  You develop fever of 102 F (38.9 C) or greater.  You develop worsening pain at or near the incision site. MAKE SURE YOU:   Understand these instructions.  Will watch your condition.  Will get help right away if you are not doing well or get worse. Document Released: 03/15/2007 Document Revised: 01/25/2012 Document Reviewed: 05/16/2007 [employer] Patient Information 2014 [employer], [place].   ________________________________________________________________________  WHAT IS A BLOOD TRANSFUSION? Blood Transfusion Information  A transfusion is the replacement of blood or some of its parts. Blood is made up of multiple cells which provide different functions.  Red blood cells carry oxygen and are used for blood loss replacement.  White blood cells fight against infection.  Platelets control bleeding.  Plasma helps clot blood.  Other blood products are available for specialized needs, such as hemophilia or other clotting disorders. BEFORE THE TRANSFUSION  Who gives blood for transfusions?   Healthy volunteers who are fully evaluated to make sure their blood is safe. This is blood bank blood. Transfusion therapy is the safest it has ever been in the practice of medicine. Before blood is taken from a donor, a complete history is taken to make sure that person has no history of diseases nor engages in risky social behavior (examples are intravenous drug use or sexual  activity with multiple partners). The donor's travel history is screened to minimize risk of transmitting infections, such as malaria. The donated blood is tested for signs of infectious diseases, such as HIV and hepatitis. The blood is then tested to be sure it is compatible with you in order to minimize the chance of a transfusion reaction. If you or a relative donates blood, this is often done in anticipation of surgery and is not appropriate for emergency situations. It takes many days to process the donated blood. RISKS AND COMPLICATIONS Although transfusion therapy is very safe and saves many lives, the main dangers of transfusion include:   Getting an infectious disease.  Developing a transfusion reaction. This is an allergic reaction to something in the blood you were given. Every precaution is taken to prevent this. The decision to have a blood transfusion has been considered carefully by your caregiver before blood is given. Blood is not given unless the benefits outweigh the risks. AFTER THE TRANSFUSION  Right after receiving a blood transfusion, you will usually feel much better and more energetic. This is especially true if your red blood cells have gotten low (  anemic). The transfusion raises the level of the red blood cells which carry oxygen, and this usually causes an energy increase.  The nurse administering the transfusion will monitor you carefully for complications. HOME CARE INSTRUCTIONS  No special instructions are needed after a transfusion. You may find your energy is better. Speak with your caregiver about any limitations on activity for underlying diseases you may have. SEEK MEDICAL CARE IF:   Your condition is not improving after your transfusion.  You develop redness or irritation at the intravenous (IV) site. SEEK IMMEDIATE MEDICAL CARE IF:  Any of the following symptoms occur over the next 12 hours:  Shaking chills.  You have a temperature by mouth above 102 F  (38.9 C), not controlled by medicine.  Chest, back, or muscle pain.  People around you feel you are not acting correctly or are confused.  Shortness of breath or difficulty breathing.  Dizziness and fainting.  You get a rash or develop hives.  You have a decrease in urine output.  Your urine turns a dark color or changes to pink, red, or brown. Any of the following symptoms occur over the next 10 days:  You have a temperature by mouth above 102 F (38.9 C), not controlled by medicine.  Shortness of breath.  Weakness after normal activity.  The white part of the eye turns yellow (jaundice).  You have a decrease in the amount of urine or are urinating less often.  Your urine turns a dark color or changes to pink, red, or brown. Document Released: 10/30/2000 Document Revised: 01/25/2012 Document Reviewed: 06/18/2008 [facility] Patient Information 2014 [place], [place].  _______________________________________________________________________

## 2014-11-05 NOTE — Progress Notes (Signed)
Please place orders in EPIc.  Surgery on 11/19/2014 .  Preop on 11/06/14.  Thank You.

## 2014-11-06 ENCOUNTER — Encounter (HOSPITAL_COMMUNITY)
Admission: RE | Admit: 2014-11-06 | Discharge: 2014-11-06 | Disposition: A | Discharge status: Home / Self Care | Payer: Medicare HMO | Source: Ambulatory Visit | Attending: Orthopedic Surgery | Admitting: Orthopedic Surgery

## 2014-11-06 ENCOUNTER — Encounter (HOSPITAL_COMMUNITY): Payer: Self-pay

## 2014-11-06 ENCOUNTER — Ambulatory Visit (HOSPITAL_COMMUNITY)
Admission: RE | Admit: 2014-11-06 | Discharge: 2014-11-06 | Disposition: A | Discharge status: Home / Self Care | Payer: Medicare HMO | Source: Ambulatory Visit | Attending: Anesthesiology | Admitting: Anesthesiology

## 2014-11-06 ENCOUNTER — Other Ambulatory Visit (HOSPITAL_COMMUNITY): Payer: Self-pay | Admitting: *Deleted

## 2014-11-06 DIAGNOSIS — Z01818 Encounter for other preprocedural examination: Secondary | ICD-10-CM | POA: Insufficient documentation

## 2014-11-06 DIAGNOSIS — I10 Essential (primary) hypertension: Secondary | ICD-10-CM | POA: Insufficient documentation

## 2014-11-06 DIAGNOSIS — Z951 Presence of aortocoronary bypass graft: Secondary | ICD-10-CM | POA: Diagnosis not present

## 2014-11-06 DIAGNOSIS — I739 Peripheral vascular disease, unspecified: Secondary | ICD-10-CM

## 2014-11-06 HISTORY — DX: Dorsalgia, unspecified: M54.9

## 2014-11-06 HISTORY — DX: Acute myocardial infarction, unspecified: I21.9

## 2014-11-06 HISTORY — DX: Other chronic pain: G89.29

## 2014-11-06 LAB — CBC
HCT: 39.6 % (ref 39.0–52.0)
Hemoglobin: 12.9 g/dL — ABNORMAL LOW (ref 13.0–17.0)
MCH: 30.4 pg (ref 26.0–34.0)
MCHC: 32.6 g/dL (ref 30.0–36.0)
MCV: 93.2 fL (ref 78.0–100.0)
PLATELETS: 360 10*3/uL (ref 150–400)
RBC: 4.25 MIL/uL (ref 4.22–5.81)
RDW: 14.5 % (ref 11.5–15.5)
WBC: 4.1 10*3/uL (ref 4.0–10.5)

## 2014-11-06 LAB — BASIC METABOLIC PANEL
ANION GAP: 11 (ref 5–15)
BUN: 14 mg/dL (ref 6–23)
CALCIUM: 9.9 mg/dL (ref 8.4–10.5)
CO2: 25 mmol/L (ref 19–32)
Chloride: 98 mEq/L (ref 96–112)
Creatinine, Ser: 1.16 mg/dL (ref 0.50–1.35)
GFR, EST AFRICAN AMERICAN: 78 mL/min — AB (ref >90)
GFR, EST NON AFRICAN AMERICAN: 68 mL/min — AB (ref >90)
Glucose, Bld: 116 mg/dL — ABNORMAL HIGH (ref 70–99)
POTASSIUM: 4.1 mmol/L (ref 3.5–5.1)
SODIUM: 134 mmol/L — AB (ref 135–145)

## 2014-11-06 LAB — APTT: aPTT: 28 seconds (ref 24–37)

## 2014-11-06 LAB — PROTIME-INR
INR: 1.03 (ref 0.00–1.49)
Prothrombin Time: 13.6 seconds (ref 11.6–15.2)

## 2014-11-06 LAB — URINALYSIS, ROUTINE W REFLEX MICROSCOPIC
BILIRUBIN URINE: NEGATIVE
GLUCOSE, UA: NEGATIVE mg/dL
Hgb urine dipstick: NEGATIVE
KETONES UR: NEGATIVE mg/dL
Leukocytes, UA: NEGATIVE
Nitrite: NEGATIVE
PH: 6 (ref 5.0–8.0)
Protein, ur: NEGATIVE mg/dL
SPECIFIC GRAVITY, URINE: 1.02 (ref 1.005–1.030)
Urobilinogen, UA: 0.2 mg/dL (ref 0.0–1.0)

## 2014-11-06 LAB — SURGICAL PCR SCREEN
MRSA, PCR: NEGATIVE
STAPHYLOCOCCUS AUREUS: NEGATIVE

## 2014-11-06 NOTE — Progress Notes (Signed)
Consulted Dr. Lissa Hoard about patient's  cardiac history and per Dr. Grayce Sessions recommendation, patient to continue Aspirin if had stents placed after CABG . Talked in person with Adrian Prince, PA the recommendations from Dr. Lissa Hoard and then Dr. Lissa Hoard walked up and joined in conversation and they talked . Unknown what recommendations are.[name]

## 2014-11-06 NOTE — Progress Notes (Addendum)
10/12/2014- EKG- EPIC  Stress TEst 05/2012 EPIC  LOV with Kerin Ransom, PA  ( cardiology) 10/12/14 in EPIC  Clearance- Dr Stanford Breed on chart- 10/17/2014

## 2014-11-12 NOTE — H&P (Signed)
UNICOMPARTMENTAL KNEE ADMISSION H&P  Patient is being admitted for right medial unicompartmental knee arthroplasty.  Subjective:  Chief Complaint:    Right knee medial compartment OA / pain.  HPI: Steven Hale, 58 y.o. male, has a history of pain and functional disability in the right knee due to arthritis and has failed non-surgical conservative treatments for greater than 12 weeks to include NSAID's and/or analgesics, corticosteriod injections, supervised PT with diminished ADL's post treatment, use of assistive devices and activity modification.  Onset of symptoms was abrupt, starting 03/03/2012 , when his back gave out and he fell off a ten foot ladder.  The pain has  gradually worsening course since that time. The patient noted prior procedures on the knee to include  arthroscopy on the right knee(s).  Patient currently rates pain in the right knee(s) at 10 out of 10 with activity. Patient has night pain, worsening of pain with activity and weight bearing, pain that interferes with activities of daily living, pain with passive range of motion, crepitus and joint swelling.  Patient has evidence of periarticular osteophytes and joint space narrowing of the medial compartment by imaging studies.  There is no active infection.  Risks, benefits and expectations were discussed with the patient.  Risks including but not limited to the risk of anesthesia, blood clots, nerve damage, blood vessel damage, failure of the prosthesis, infection and up to and including death.  Patient understand the risks, benefits and expectations and wishes to proceed with surgery.   PCP: Angelica Chessman, MD  D/C Plans:      Home with HHPT  Post-op Meds:       No Rx given   Tranexamic Acid:      To be given - topically (previous MI x2)  Decadron:      Is to be given  FYI:     Plavix and ASA post-op  Oxycodone post-op    Patient Active Problem List   Diagnosis Date Noted  . Lumbar disc herniation with  radiculopathy 10/15/2014  . Back pain 10/15/2014  . Acute back pain   . Chronic back pain 10/12/2014  . H/O pancreatic cancer 2009 10/12/2014  . Iron deficiency anemia 06/11/2014  . Testosterone deficiency 09/07/2013  . Benign paroxysmal positional vertigo 08/30/2013  . PAD (peripheral artery disease) 06/02/2012  . Claudication 05/14/2011  . BLOOD IN STOOL 09/20/2009  . PVD (peripheral vascular disease) 01/08/2009  . BELL'S PALSY, RIGHT 12/25/2008  . Hyperlipidemia 06/05/2008  . CAD- multiple PCIs, CABG 2011, low risk Myoview 05/2012 11/17/2003   Past Medical History  Diagnosis Date  . PAD (peripheral artery disease)     a. s/p bilat SFA stents;  b. ABIs 11/2012: R 0.99, L 0.86.  [name] Hypertension   . Hyperlipidemia   . Anemia, unspecified   . GERD (gastroesophageal reflux disease)   . Helicobacter pylori (H. pylori) infection   . Hemorrhoids   . Impotence of organic origin   . Bell's palsy   . CAD (coronary artery disease)     a. s/p multiple PCIs;  b. s/p CABG in 09/2010 (LIMA-LAD, SVG-OM1, SVG-distal RCA/OM2);  c.  Myoview 05/2012: EF 54%, no ischemia, no scar.   . Blood transfusion     during treatment for Ca  . DJD (degenerative joint disease)     low back & all over   . Pancreatic cancer     surgery 2009  . Myocardial infarction     2005 and 2012   . Chronic back  pain     Past Surgical History  Procedure Laterality Date  . Pancreatic  cancer  05/10/2008    Resection of distal panrease and spleen  . Splenectomy    . Back surgery      1992  . Peripheral vascular catheterization  Oct 2014    Lt SFA PTA  . Hemorrhoid surgery  10/30/2011    Procedure: HEMORRHOIDECTOMY;  Surgeon: Harl Bowie, MD;  Location: [facility];  Service: General;  Laterality: N/A;  . Peripheral vascular catheterization  Oct 2015    ISR Lt SFA-PTA  . Abdominal aortagram N/A 09/13/2013    Procedure: ABDOMINAL AORTAGRAM;  Surgeon: Wellington Hampshire, MD;  Location: [facility] CATH LAB;  Service:  Cardiovascular;  Laterality: N/A;  . Thrombectomy femoral artery Left 09/13/2013    Procedure: THROMBECTOMY FEMORAL ARTERY;  Surgeon: Wellington Hampshire, MD;  Location: [facility] CATH LAB;  Service: Cardiovascular;  Laterality: Left;  . Abdominal aortagram N/A 08/29/2014    Procedure: ABDOMINAL AORTAGRAM;  Surgeon: Wellington Hampshire, MD;  Location: [facility] CATH LAB;  Service: Cardiovascular;  Laterality: N/A;  . Coronary artery bypass graft  nov 2011    x 4    No prescriptions prior to admission   No Known Allergies  History  Substance Use Topics  . Smoking status: Former Smoker    Types: Cigarettes    Quit date: 11/29/2008  . Smokeless tobacco: Never Used  . Alcohol Use: Yes     Comment: 2 drinks of brandy every week     Family History  Problem Relation Age of Onset  . Heart attack Father     died of MI at age 21  . Anesthesia problems Neg Hx   . Hypotension Neg Hx   . Malignant hyperthermia Neg Hx   . Pseudochol deficiency Neg Hx   . Colon cancer Neg Hx   . Colon polyps Neg Hx   . Diabetes Neg Hx   . Kidney disease Neg Hx   . Esophageal cancer Neg Hx      Review of Systems  Constitutional: Negative.   HENT: Negative.   Eyes: Negative.   Respiratory: Negative.   Cardiovascular: Negative.   Gastrointestinal: Positive for heartburn.  Genitourinary: Negative.   Musculoskeletal: Positive for back pain and joint pain.  Skin: Negative.   Neurological: Negative.   Endo/Heme/Allergies: Negative.   Psychiatric/Behavioral: Negative.     Objective:  Physical Exam  Constitutional: He is oriented to person, place, and time. He appears well-developed and well-nourished.  HENT:  Head: Normocephalic and atraumatic.  Eyes: Pupils are equal, round, and reactive to light.  Neck: Neck supple. No JVD present. No tracheal deviation present. No thyromegaly present.  Cardiovascular: Normal rate, regular rhythm, normal heart sounds and intact distal pulses.   Respiratory: Effort normal and breath  sounds normal. No stridor. No respiratory distress. He has no wheezes.  GI: Soft. There is no tenderness. There is no guarding.  Musculoskeletal:       Right knee: He exhibits decreased range of motion, swelling and bony tenderness. He exhibits no ecchymosis, no deformity, no laceration and no erythema. Tenderness found. Medial joint line tenderness noted. No lateral joint line tenderness noted.  Lymphadenopathy:    He has no cervical adenopathy.  Neurological: He is alert and oriented to person, place, and time.  Skin: Skin is warm and dry.  Psychiatric: He has a normal mood and affect.      Labs:  Estimated body mass index is 32.47  kg/(m^2) as calculated from the following:   Height as of 10/03/14: 5\' 9"  (1.753 m).   Weight as of 10/03/14: 99.791 kg (220 lb).   Imaging Review Plain radiographs demonstrate moderate degenerative joint disease of the right knee, medial compartment. The overall alignment isneutral. The bone quality appears to be good for age and reported activity level.  Assessment/Plan:  End stage arthritis, right knee medial compartment  The patient history, physical examination, clinical judgment of the provider and imaging studies are consistent with end stage degenerative joint disease of the right knee medial compartment and medial unicompartmental knee arthroplasty is deemed medically necessary. The treatment options including medical management, injection therapy arthroscopy and arthroplasty were discussed at length. The risks and benefits of total knee arthroplasty were presented and reviewed. The risks due to aseptic loosening, infection, stiffness, patella tracking problems, thromboembolic complications and other imponderables were discussed. The patient acknowledged the explanation, agreed to proceed with the plan and consent was signed. Patient is being admitted for outpatient / observation / inpatient treatment for surgery, pain control, PT, OT, prophylactic  antibiotics, VTE prophylaxis, progressive ambulation and ADL's and discharge planning. The patient is planning to be discharged home with home health services.    [place] Pugh Memori Sammon   PA-C  11/12/2014, 9:26 PM

## 2014-11-13 ENCOUNTER — Other Ambulatory Visit (HOSPITAL_COMMUNITY): Payer: Self-pay | Admitting: Anesthesiology

## 2014-11-13 DIAGNOSIS — M5440 Lumbago with sciatica, unspecified side: Secondary | ICD-10-CM

## 2014-11-18 NOTE — Anesthesia Preprocedure Evaluation (Addendum)
Anesthesia Evaluation  Patient identified by MRN, date of birth, ID band Patient awake    Reviewed: Allergy & Precautions, H&P , NPO status , Patient's Chart, lab work & pertinent test results, reviewed documented beta blocker date and time   Airway Mallampati: I  TM Distance: >3 FB Neck ROM: full    Dental  (+) Edentulous Upper, Edentulous Lower, Poor Dentition   Pulmonary shortness of breath and with exertion, former smoker,          Cardiovascular Exercise Tolerance: Good hypertension, Pt. on medications and Pt. on home beta blockers + CAD, + Past MI and + Peripheral Vascular Disease Rhythm:regular Rate:Normal  CABG 11/11; occasional CP with exertion; PVD with claudication   Neuro/Psych PSYCHIATRIC DISORDERS Anxiety Depression  Neuromuscular disease    GI/Hepatic Neg liver ROS, GERD-  Medicated and Controlled,  Endo/Other  negative endocrine ROS  Renal/GU negative Renal ROS  negative genitourinary   Musculoskeletal  (+) Arthritis -,   Abdominal   Peds  Hematology negative hematology ROS (+) anemia ,   Anesthesia Other Findings   Reproductive/Obstetrics                            Anesthesia Physical  Anesthesia Plan  ASA: III  Anesthesia Plan: General   Post-op Pain Management:    Induction: Intravenous  Airway Management Planned: Oral ETT  Additional Equipment: None  Intra-op Plan:   Post-operative Plan: Extubation in OR  Informed Consent: I have reviewed the patients History and Physical, chart, labs and discussed the procedure including the risks, benefits and alternatives for the proposed anesthesia with the patient or authorized representative who has indicated his/her understanding and acceptance.   Dental advisory given  Plan Discussed with: CRNA  Anesthesia Plan Comments: (Pt unsure exactly when he stopped his plavix. He thinks most likely his last dose was 12/30.  Recommendation for spinal is 7 days. Plan GETA)      Anesthesia Quick Evaluation

## 2014-11-19 ENCOUNTER — Encounter (HOSPITAL_COMMUNITY)
Admission: RE | Disposition: A | Discharge status: Home / Self Care | Payer: Self-pay | Source: Ambulatory Visit | Attending: Orthopedic Surgery

## 2014-11-19 ENCOUNTER — Ambulatory Visit (HOSPITAL_COMMUNITY): Payer: Worker's Compensation | Admitting: Anesthesiology

## 2014-11-19 ENCOUNTER — Encounter (HOSPITAL_COMMUNITY): Payer: Self-pay

## 2014-11-19 ENCOUNTER — Ambulatory Visit (HOSPITAL_COMMUNITY)
Admission: RE | Admit: 2014-11-19 | Discharge: 2014-11-19 | Disposition: A | Discharge status: Home / Self Care | Payer: Worker's Compensation | Source: Ambulatory Visit | Attending: Orthopedic Surgery | Admitting: Orthopedic Surgery

## 2014-11-19 DIAGNOSIS — Z87891 Personal history of nicotine dependence: Secondary | ICD-10-CM | POA: Insufficient documentation

## 2014-11-19 DIAGNOSIS — K219 Gastro-esophageal reflux disease without esophagitis: Secondary | ICD-10-CM | POA: Diagnosis not present

## 2014-11-19 DIAGNOSIS — K921 Melena: Secondary | ICD-10-CM | POA: Insufficient documentation

## 2014-11-19 DIAGNOSIS — M549 Dorsalgia, unspecified: Secondary | ICD-10-CM | POA: Diagnosis not present

## 2014-11-19 DIAGNOSIS — Z951 Presence of aortocoronary bypass graft: Secondary | ICD-10-CM | POA: Insufficient documentation

## 2014-11-19 DIAGNOSIS — I10 Essential (primary) hypertension: Secondary | ICD-10-CM | POA: Insufficient documentation

## 2014-11-19 DIAGNOSIS — I739 Peripheral vascular disease, unspecified: Secondary | ICD-10-CM | POA: Diagnosis not present

## 2014-11-19 DIAGNOSIS — D509 Iron deficiency anemia, unspecified: Secondary | ICD-10-CM | POA: Insufficient documentation

## 2014-11-19 DIAGNOSIS — M5126 Other intervertebral disc displacement, lumbar region: Secondary | ICD-10-CM | POA: Diagnosis not present

## 2014-11-19 DIAGNOSIS — I2510 Atherosclerotic heart disease of native coronary artery without angina pectoris: Secondary | ICD-10-CM | POA: Insufficient documentation

## 2014-11-19 DIAGNOSIS — G510 Bell's palsy: Secondary | ICD-10-CM | POA: Diagnosis not present

## 2014-11-19 DIAGNOSIS — Z8249 Family history of ischemic heart disease and other diseases of the circulatory system: Secondary | ICD-10-CM | POA: Diagnosis not present

## 2014-11-19 DIAGNOSIS — Z800 Family history of malignant neoplasm of digestive organs: Secondary | ICD-10-CM | POA: Insufficient documentation

## 2014-11-19 DIAGNOSIS — E291 Testicular hypofunction: Secondary | ICD-10-CM | POA: Diagnosis not present

## 2014-11-19 DIAGNOSIS — Z8507 Personal history of malignant neoplasm of pancreas: Secondary | ICD-10-CM | POA: Diagnosis not present

## 2014-11-19 DIAGNOSIS — M1711 Unilateral primary osteoarthritis, right knee: Secondary | ICD-10-CM | POA: Insufficient documentation

## 2014-11-19 DIAGNOSIS — Z86018 Personal history of other benign neoplasm: Secondary | ICD-10-CM

## 2014-11-19 DIAGNOSIS — I252 Old myocardial infarction: Secondary | ICD-10-CM | POA: Insufficient documentation

## 2014-11-19 DIAGNOSIS — E785 Hyperlipidemia, unspecified: Secondary | ICD-10-CM | POA: Diagnosis not present

## 2014-11-19 DIAGNOSIS — Z9889 Other specified postprocedural states: Secondary | ICD-10-CM

## 2014-11-19 DIAGNOSIS — R42 Dizziness and giddiness: Secondary | ICD-10-CM | POA: Diagnosis not present

## 2014-11-19 DIAGNOSIS — G8929 Other chronic pain: Secondary | ICD-10-CM | POA: Diagnosis not present

## 2014-11-19 DIAGNOSIS — Z96651 Presence of right artificial knee joint: Secondary | ICD-10-CM

## 2014-11-19 HISTORY — PX: PARTIAL KNEE ARTHROPLASTY: SHX2174

## 2014-11-19 LAB — TYPE AND SCREEN
ABO/RH(D): O POS
Antibody Screen: NEGATIVE

## 2014-11-19 SURGERY — ARTHROPLASTY, KNEE, UNICOMPARTMENTAL
Anesthesia: General | Site: Knee | Laterality: Right

## 2014-11-19 MED ORDER — GLYCOPYRROLATE 0.2 MG/ML IJ SOLN
INTRAMUSCULAR | Status: AC
  Filled 2014-11-19: qty 3

## 2014-11-19 MED ORDER — LIDOCAINE HCL (CARDIAC) 20 MG/ML IV SOLN
INTRAVENOUS | Status: AC
  Filled 2014-11-19: qty 5

## 2014-11-19 MED ORDER — LACTATED RINGERS IV SOLN
INTRAVENOUS | Status: DC | PRN
  Administered 2014-11-19 (×3): via INTRAVENOUS

## 2014-11-19 MED ORDER — SUCCINYLCHOLINE CHLORIDE 20 MG/ML IJ SOLN
INTRAMUSCULAR | Status: DC | PRN
  Administered 2014-11-19: 120 mg via INTRAVENOUS

## 2014-11-19 MED ORDER — KETOROLAC TROMETHAMINE 30 MG/ML IJ SOLN
INTRAMUSCULAR | Status: DC | PRN
  Administered 2014-11-19: 30 mg via INTRAVENOUS

## 2014-11-19 MED ORDER — FENTANYL CITRATE 0.05 MG/ML IJ SOLN
INTRAMUSCULAR | Status: AC
  Filled 2014-11-19: qty 2

## 2014-11-19 MED ORDER — TRANEXAMIC ACID 100 MG/ML IV SOLN
2000.0000 mg | INTRAVENOUS | Status: DC | PRN
  Administered 2014-11-19: 2000 mg via TOPICAL

## 2014-11-19 MED ORDER — NEOSTIGMINE METHYLSULFATE 10 MG/10ML IV SOLN
INTRAVENOUS | Status: DC | PRN
  Administered 2014-11-19: 4 mg via INTRAVENOUS

## 2014-11-19 MED ORDER — EPHEDRINE SULFATE 50 MG/ML IJ SOLN
INTRAMUSCULAR | Status: AC
  Filled 2014-11-19: qty 1

## 2014-11-19 MED ORDER — 0.9 % SODIUM CHLORIDE (POUR BTL) OPTIME
TOPICAL | Status: DC | PRN
  Administered 2014-11-19: 1000 mL

## 2014-11-19 MED ORDER — CEFAZOLIN SODIUM-DEXTROSE 2-3 GM-% IV SOLR
INTRAVENOUS | Status: AC
  Filled 2014-11-19: qty 50

## 2014-11-19 MED ORDER — DEXAMETHASONE SODIUM PHOSPHATE 10 MG/ML IJ SOLN
INTRAMUSCULAR | Status: AC
  Filled 2014-11-19: qty 1

## 2014-11-19 MED ORDER — ROCURONIUM BROMIDE 100 MG/10ML IV SOLN
INTRAVENOUS | Status: AC
  Filled 2014-11-19: qty 1

## 2014-11-19 MED ORDER — SODIUM CHLORIDE 0.9 % IJ SOLN
INTRAMUSCULAR | Status: AC
  Filled 2014-11-19: qty 50

## 2014-11-19 MED ORDER — BUPIVACAINE-EPINEPHRINE 0.25% -1:200000 IJ SOLN
INTRAMUSCULAR | Status: DC | PRN
  Administered 2014-11-19: 30 mL

## 2014-11-19 MED ORDER — ATROPINE SULFATE 0.4 MG/ML IJ SOLN
INTRAMUSCULAR | Status: AC
  Filled 2014-11-19: qty 1

## 2014-11-19 MED ORDER — HYDROMORPHONE HCL 2 MG/ML IJ SOLN
INTRAMUSCULAR | Status: AC
  Filled 2014-11-19: qty 1

## 2014-11-19 MED ORDER — METHOCARBAMOL 500 MG PO TABS: 500.0000 mg | ORAL_TABLET | Freq: Four times a day (QID) | ORAL | Status: DC | PRN

## 2014-11-19 MED ORDER — ASPIRIN EC 81 MG PO TBEC: 81.0000 mg | DELAYED_RELEASE_TABLET | Freq: Every day | ORAL | Status: DC

## 2014-11-19 MED ORDER — DEXAMETHASONE SODIUM PHOSPHATE 10 MG/ML IJ SOLN
INTRAMUSCULAR | Status: DC | PRN
  Administered 2014-11-19: 10 mg via INTRAVENOUS

## 2014-11-19 MED ORDER — OXYCODONE HCL 5 MG PO TABS: 5.0000 mg | ORAL_TABLET | ORAL | Status: DC

## 2014-11-19 MED ORDER — METHOCARBAMOL 1000 MG/10ML IJ SOLN
500.0000 mg | Freq: Four times a day (QID) | INTRAMUSCULAR | Status: DC | PRN
  Administered 2014-11-19: 500 mg via INTRAVENOUS
  Filled 2014-11-19 (×2): qty 5

## 2014-11-19 MED ORDER — LIDOCAINE HCL (CARDIAC) 20 MG/ML IV SOLN
INTRAVENOUS | Status: DC | PRN
  Administered 2014-11-19: 100 mg via INTRAVENOUS

## 2014-11-19 MED ORDER — EPHEDRINE SULFATE 50 MG/ML IJ SOLN
INTRAMUSCULAR | Status: DC | PRN
  Administered 2014-11-19 (×2): 5 mg via INTRAVENOUS

## 2014-11-19 MED ORDER — BUPIVACAINE-EPINEPHRINE (PF) 0.25% -1:200000 IJ SOLN
INTRAMUSCULAR | Status: AC
  Filled 2014-11-19: qty 30

## 2014-11-19 MED ORDER — KETOROLAC TROMETHAMINE 30 MG/ML IJ SOLN
INTRAMUSCULAR | Status: AC
  Filled 2014-11-19: qty 1

## 2014-11-19 MED ORDER — MIDAZOLAM HCL 2 MG/2ML IJ SOLN
INTRAMUSCULAR | Status: AC
  Filled 2014-11-19: qty 2

## 2014-11-19 MED ORDER — CEFAZOLIN SODIUM-DEXTROSE 2-3 GM-% IV SOLR
2.0000 g | INTRAVENOUS | Status: AC
  Administered 2014-11-19: 2 g via INTRAVENOUS

## 2014-11-19 MED ORDER — PROMETHAZINE HCL 25 MG/ML IJ SOLN: 6.2500 mg | INTRAMUSCULAR | Status: DC | PRN

## 2014-11-19 MED ORDER — ONDANSETRON HCL 4 MG/2ML IJ SOLN
INTRAMUSCULAR | Status: AC
  Filled 2014-11-19: qty 2

## 2014-11-19 MED ORDER — ROCURONIUM BROMIDE 100 MG/10ML IV SOLN
INTRAVENOUS | Status: DC | PRN
  Administered 2014-11-19: 50 mg via INTRAVENOUS

## 2014-11-19 MED ORDER — PROPOFOL 10 MG/ML IV BOLUS
INTRAVENOUS | Status: AC
  Filled 2014-11-19: qty 20

## 2014-11-19 MED ORDER — CHLORHEXIDINE GLUCONATE 4 % EX LIQD: 60.0000 mL | Freq: Once | CUTANEOUS | Status: DC

## 2014-11-19 MED ORDER — FENTANYL CITRATE 0.05 MG/ML IJ SOLN
INTRAMUSCULAR | Status: AC
  Filled 2014-11-19: qty 5

## 2014-11-19 MED ORDER — HYDROMORPHONE HCL 1 MG/ML IJ SOLN
INTRAMUSCULAR | Status: DC | PRN
  Administered 2014-11-19 (×2): 0.5 mg via INTRAVENOUS

## 2014-11-19 MED ORDER — CLOPIDOGREL BISULFATE 75 MG PO TABS: 75.0000 mg | ORAL_TABLET | Freq: Every day | ORAL | Status: DC

## 2014-11-19 MED ORDER — MIDAZOLAM HCL 5 MG/5ML IJ SOLN
INTRAMUSCULAR | Status: DC | PRN
  Administered 2014-11-19: 2 mg via INTRAVENOUS

## 2014-11-19 MED ORDER — FENTANYL CITRATE 0.05 MG/ML IJ SOLN
INTRAMUSCULAR | Status: DC | PRN
  Administered 2014-11-19 (×2): 100 ug via INTRAVENOUS
  Administered 2014-11-19 (×3): 50 ug via INTRAVENOUS

## 2014-11-19 MED ORDER — GLYCOPYRROLATE 0.2 MG/ML IJ SOLN
INTRAMUSCULAR | Status: DC | PRN
  Administered 2014-11-19: 0.6 mg via INTRAVENOUS

## 2014-11-19 MED ORDER — NEOSTIGMINE METHYLSULFATE 10 MG/10ML IV SOLN
INTRAVENOUS | Status: AC
  Filled 2014-11-19: qty 1

## 2014-11-19 MED ORDER — PROPOFOL 10 MG/ML IV BOLUS
INTRAVENOUS | Status: DC | PRN
  Administered 2014-11-19: 200 mg via INTRAVENOUS

## 2014-11-19 MED ORDER — MEPERIDINE HCL 50 MG/ML IJ SOLN: 6.2500 mg | INTRAMUSCULAR | Status: DC | PRN

## 2014-11-19 MED ORDER — SODIUM CHLORIDE 0.9 % IJ SOLN
INTRAMUSCULAR | Status: AC
  Filled 2014-11-19: qty 10

## 2014-11-19 MED ORDER — CEPHALEXIN 500 MG PO CAPS: 500.0000 mg | ORAL_CAPSULE | Freq: Three times a day (TID) | ORAL | Status: DC

## 2014-11-19 MED ORDER — ONDANSETRON HCL 4 MG/2ML IJ SOLN
INTRAMUSCULAR | Status: DC | PRN
  Administered 2014-11-19: 4 mg via INTRAVENOUS

## 2014-11-19 MED ORDER — SODIUM CHLORIDE 0.9 % IV SOLN
2000.0000 mg | Freq: Once | INTRAVENOUS | Status: DC
  Filled 2014-11-19: qty 20

## 2014-11-19 MED ORDER — CELECOXIB 200 MG PO CAPS: 200.0000 mg | ORAL_CAPSULE | Freq: Two times a day (BID) | ORAL | Status: DC

## 2014-11-19 MED ORDER — HYDROMORPHONE HCL 1 MG/ML IJ SOLN
0.2500 mg | INTRAMUSCULAR | Status: DC | PRN
  Administered 2014-11-19 (×2): 0.5 mg via INTRAVENOUS

## 2014-11-19 MED ORDER — SODIUM CHLORIDE 0.9 % IJ SOLN
INTRAMUSCULAR | Status: DC | PRN
  Administered 2014-11-19: 30 mL via INTRAVENOUS

## 2014-11-19 MED ORDER — OXYCODONE HCL 5 MG PO TABS: 5.0000 mg | ORAL_TABLET | ORAL | Status: DC | PRN

## 2014-11-19 MED ORDER — HYDROMORPHONE HCL 1 MG/ML IJ SOLN
INTRAMUSCULAR | Status: AC
  Filled 2014-11-19: qty 1

## 2014-11-19 SURGICAL SUPPLY — 55 items
ADH SKN CLS APL DERMABOND .7 (GAUZE/BANDAGES/DRESSINGS) ×1
BAG SPEC THK2 15X12 ZIP CLS (MISCELLANEOUS)
BAG ZIPLOCK 12X15 (MISCELLANEOUS) IMPLANT
BANDAGE ELASTIC 6 VELCRO ST LF (GAUZE/BANDAGES/DRESSINGS) ×2 IMPLANT
BANDAGE ESMARK 6X9 LF (GAUZE/BANDAGES/DRESSINGS) ×1 IMPLANT
BLADE SAW RECIPROCATING 77.5 (BLADE) ×2 IMPLANT
BLADE SAW SGTL 13.0X1.19X90.0M (BLADE) ×2 IMPLANT
BNDG CMPR 9X6 STRL LF SNTH (GAUZE/BANDAGES/DRESSINGS) ×1
BNDG ESMARK 6X9 LF (GAUZE/BANDAGES/DRESSINGS) ×2
BONE CEMENT GENTAMICIN (Cement) ×2 IMPLANT
BOWL SMART MIX CTS (DISPOSABLE) ×2 IMPLANT
CAPT KNEE PARTIAL 2 ×2 IMPLANT
CEMENT BONE GENTAMICIN 40 (Cement) ×1 IMPLANT
CUFF TOURN SGL QUICK 34 (TOURNIQUET CUFF) ×2
CUFF TRNQT CYL 34X4X40X1 (TOURNIQUET CUFF) ×1 IMPLANT
DERMABOND ADVANCED (GAUZE/BANDAGES/DRESSINGS) ×1
DERMABOND ADVANCED .7 DNX12 (GAUZE/BANDAGES/DRESSINGS) IMPLANT
DRAPE EXTREMITY T 121X128X90 (DRAPE) ×2 IMPLANT
DRAPE POUCH INSTRU U-SHP 10X18 (DRAPES) ×2 IMPLANT
DRAPE U-SHAPE 47X51 STRL (DRAPES) ×2 IMPLANT
DRSG AQUACEL AG ADV 3.5X10 (GAUZE/BANDAGES/DRESSINGS) ×2 IMPLANT
DRSG TEGADERM 4X4.75 (GAUZE/BANDAGES/DRESSINGS) IMPLANT
DURAPREP 26ML APPLICATOR (WOUND CARE) ×4 IMPLANT
ELECT REM PT RETURN 9FT ADLT (ELECTROSURGICAL) ×2
ELECTRODE REM PT RTRN 9FT ADLT (ELECTROSURGICAL) ×1 IMPLANT
EVACUATOR 1/8 PVC DRAIN (DRAIN) IMPLANT
FACESHIELD WRAPAROUND (MASK) ×6 IMPLANT
GAUZE SPONGE 2X2 8PLY STRL LF (GAUZE/BANDAGES/DRESSINGS) IMPLANT
GLOVE BIOGEL PI IND STRL 7.5 (GLOVE) ×1 IMPLANT
GLOVE BIOGEL PI IND STRL 8.5 (GLOVE) ×1 IMPLANT
GLOVE BIOGEL PI INDICATOR 7.5 (GLOVE) ×1
GLOVE BIOGEL PI INDICATOR 8.5 (GLOVE) ×1
GLOVE ECLIPSE 8.0 STRL XLNG CF (GLOVE) ×2 IMPLANT
GLOVE ORTHO TXT STRL SZ7.5 (GLOVE) ×4 IMPLANT
GOWN SPEC L3 XXLG W/TWL (GOWN DISPOSABLE) ×2 IMPLANT
GOWN STRL REUS W/TWL LRG LVL3 (GOWN DISPOSABLE) ×2 IMPLANT
KIT BASIN OR (CUSTOM PROCEDURE TRAY) ×2 IMPLANT
LEGGING LITHOTOMY PAIR STRL (DRAPES) ×2 IMPLANT
LIQUID BAND (GAUZE/BANDAGES/DRESSINGS) ×2 IMPLANT
MANIFOLD NEPTUNE II (INSTRUMENTS) ×2 IMPLANT
NDL SAFETY ECLIPSE 18X1.5 (NEEDLE) ×1 IMPLANT
NEEDLE HYPO 18GX1.5 SHARP (NEEDLE) ×2
PACK TOTAL JOINT (CUSTOM PROCEDURE TRAY) ×2 IMPLANT
SPONGE GAUZE 2X2 STER 10/PKG (GAUZE/BANDAGES/DRESSINGS)
SUCTION FRAZIER TIP 10 FR DISP (SUCTIONS) ×2 IMPLANT
SUT MNCRL AB 4-0 PS2 18 (SUTURE) ×2 IMPLANT
SUT VIC AB 1 CT1 36 (SUTURE) ×2 IMPLANT
SUT VIC AB 2-0 CT1 27 (SUTURE) ×4
SUT VIC AB 2-0 CT1 TAPERPNT 27 (SUTURE) ×2 IMPLANT
SUT VLOC 180 0 24IN GS25 (SUTURE) ×2 IMPLANT
SYR 50ML LL SCALE MARK (SYRINGE) ×2 IMPLANT
TOWEL OR 17X26 10 PK STRL BLUE (TOWEL DISPOSABLE) ×2 IMPLANT
TOWEL OR NON WOVEN STRL DISP B (DISPOSABLE) IMPLANT
TRAY FOLEY CATH 16FRSI W/METER (SET/KITS/TRAYS/PACK) ×1 IMPLANT
WRAP KNEE MAXI GEL POST OP (GAUZE/BANDAGES/DRESSINGS) ×2 IMPLANT

## 2014-11-19 NOTE — Anesthesia Postprocedure Evaluation (Signed)
Anesthesia Post Note  Patient: Steven Hale  Procedure(s) Performed: Procedure(s) (LRB): RIGHT UNI-KNEE ARTHROPLASTY MEDIALLY (Right)  Anesthesia type: General  Patient location: PACU  Post pain: Pain level controlled  Post assessment: Post-op Vital signs reviewed  Last Vitals: BP 147/89 mmHg  Pulse 97  Temp(Src) 36.5 C (Oral)  Resp 18  Ht 5\' 9"  (1.753 m)  Wt 214 lb (97.07 kg)  BMI 31.59 kg/m2  SpO2 98%  Post vital signs: Reviewed  Level of consciousness: sedated  Complications: No apparent anesthesia complications

## 2014-11-19 NOTE — Progress Notes (Addendum)
Called nurse case manager assigned to patient Steven Hale [phone]) to arrange home health. Beverlee Nims is arranging home health PT as well as a walker per Dr. Aurea Graff orders. Awaiting walker to be delivered here to hospital prior to patient's d/c home. Patient resting comfortably and denies any surgical pain.   1715   Patient does not want to wait for walker to be delivered here to short stay. Called Steven Hale to ask that walker be delivered to his home.

## 2014-11-19 NOTE — Interval H&P Note (Signed)
History and Physical Interval Note:  11/19/2014 7:14 AM  Steven Hale  has presented today for surgery, with the diagnosis of RIGHT KNEE OSTEOARTHRITIS MEDIALLY  The various methods of treatment have been discussed with the patient and family. After consideration of risks, benefits and other options for treatment, the patient has consented to  Procedure(s): RIGHT UNI-KNEE ARTHROPLASTY MEDIALLY (Right) as a surgical intervention .  The patient's history has been reviewed, patient examined, no change in status, stable for surgery.  I have reviewed the patient's chart and labs.  Questions were answered to the patient's satisfaction.     Mauri Pole

## 2014-11-19 NOTE — Transfer of Care (Signed)
Immediate Anesthesia Transfer of Care Note  Patient: Steven Hale  Procedure(s) Performed: Procedure(s) (LRB): RIGHT UNI-KNEE ARTHROPLASTY MEDIALLY (Right)  Patient Location: PACU  Anesthesia Type: General  Level of Consciousness: sedated, patient cooperative and responds to stimulation  Airway & Oxygen Therapy: Patient Spontanous Breathing and Patient connected to face mask oxgen  Post-op Assessment: Report given to PACU RN and Post -op Vital signs reviewed and stable  Post vital signs: Reviewed and stable  Complications: No apparent anesthesia complications

## 2014-11-19 NOTE — Evaluation (Signed)
Physical Therapy One Time Evaluation Patient Details Name: Steven Hale MRN: [id number] DOB: Mar 04, 1956 Today's Date: 11/19/2014   History of Present Illness  Pt is a 59 year old male s/p Rght partial knee replacement with hx of Bell's Palsy, PVD, MI, chronic back pain  Clinical Impression  Patient evaluated by Physical Therapy with no further acute PT needs identified. All education has been completed and the patient has no further questions.  Pt had been using crutches prior to surgery however incorrect height so crutches adjusted for better pt fit.  Pt very unsteady with crutches so provided RW with improved gait and pt agreeable to use upon d/c.  Pt also educated and performed safe stair technique however has a flight into his apt.  Pt reports he will have assist and reinforced need for person to assist him into home for safety.  Pt performed exercises and provided with HEP handout.  Also educated on safe use of ice and reminded to check skin.  Pt had no further questions and plans to f/u with HHPT. Recommend RW for home. PT is signing off. Thank you for this referral.     Follow Up Recommendations Home health PT    Equipment Recommendations  Rolling walker with 5" wheels    Recommendations for Other Services       Precautions / Restrictions Precautions Precautions: Fall;Knee Restrictions Other Position/Activity Restrictions: WBAT      Mobility  Bed Mobility Overal bed mobility: Needs Assistance Bed Mobility: Supine to Sit;Sit to Supine     Supine to sit: Min assist Sit to supine: Min guard   General bed mobility comments: assist for R LE over EOB due to cramping, verbal cues for self assist using L LE, pt able to perform without assist upon return to bed  Transfers Overall transfer level: Needs assistance Equipment used: Rolling walker (2 wheeled);Crutches Transfers: Sit to/from Stand Sit to Stand: Min assist         General transfer comment: min assist to steady  with use of crutches, also educated on safe technique when using crutches, performed again with RW with improved steadiness, provided verbal cues for safe technique  Ambulation/Gait Ambulation/Gait assistance: Min assist;Min guard Ambulation Distance (Feet): 60 Feet Assistive device: Crutches Gait Pattern/deviations: Antalgic;Decreased stance time - right     General Gait Details: verbal cues for sequence with crutches, pt unsteady with occasional assist required, had pt use RW for another 30 feet with improved steadiness and gait, pt agreeable to use RW upon d/c for safety  Stairs Stairs: Yes Stairs assistance: Min guard Stair Management: Step to pattern;Forwards;With crutches;One rail Left Number of Stairs: 2 General stair comments: verbal cues for sequence, safety, crutch placement, pt educated to ambulate to stairs with RW then have person assist with moving RW and providing crutch for steps, pt reports he will have assist home, pt able to verbalize and demonstrate safe technique   Wheelchair Mobility    Modified Rankin (Stroke Patients Only)       Balance                                             Pertinent Vitals/Pain Pain Assessment: No/denies pain (no pain at rest, reports cramping in back of leg but better with motion)    Home Living Family/patient expects to be discharged to:: Private residence Living Arrangements: Alone Available Help at  Discharge: Friend(s) Type of Home: Apartment Home Access: Stairs to enter Entrance Stairs-Rails: Left Entrance Stairs-Number of Steps: 6 and 6 Home Layout: One level Home Equipment: Crutches      Prior Function Level of Independence: Independent with assistive device(s)         Comments: states he was using crutches prior to surgery due to back pain, reports crutches too short, PT adjusted arm length to better fit pt     Hand Dominance        Extremity/Trunk Assessment                Lower Extremity Assessment: RLE deficits/detail RLE Deficits / Details: AAROM knee flexion 95* sitting EOB, limited by pain, unable to perform SLR, good quad contraction       Communication   Communication: Other (comment) (difficult to understand)  Cognition Arousal/Alertness: Awake/alert Behavior During Therapy: WFL for tasks assessed/performed Overall Cognitive Status: Within Functional Limits for tasks assessed                      General Comments      Exercises Total Joint Exercises Ankle Circles/Pumps: AROM;Both;5 reps Quad Sets: AROM;Both;10 reps Towel Squeeze: AROM;Both;10 reps Short Arc QuadSinclair Ship;Right;10 reps Heel Slides: AAROM;Seated;Supine;10 reps;Right Hip ABduction/ADduction: AROM;Right;10 reps Straight Leg Raises: AAROM;Right;10 reps      Assessment/Plan    PT Assessment All further PT needs can be met in the next venue of care  PT Diagnosis Abnormality of gait   PT Problem List Decreased strength;Decreased range of motion;Decreased mobility;Decreased knowledge of use of DME;Pain  PT Treatment Interventions     PT Goals (Current goals can be found in the Care Plan section) Acute Rehab PT Goals PT Goal Formulation: All assessment and education complete, DC therapy    Frequency     Barriers to discharge        Co-evaluation               End of Session Equipment Utilized During Treatment: Gait belt Activity Tolerance: Patient tolerated treatment well Patient left: in bed;with call bell/phone within reach Nurse Communication: Mobility status    Functional Assessment Tool Used: clinical judgement Functional Limitation: Mobility: Walking and moving around Mobility: Walking and Moving Around Current Status (N3567): At least 20 percent but less than 40 percent impaired, limited or restricted Mobility: Walking and Moving Around Goal Status [phone]): At least 1 percent but less than 20 percent impaired, limited or  restricted Mobility: Walking and Moving Around Discharge Status [phone]): At least 1 percent but less than 20 percent impaired, limited or restricted    Time: 1324-1405 PT Time Calculation (min) (ACUTE ONLY): 41 min   Charges:   PT Evaluation $Initial PT Evaluation Tier I: 1 Procedure PT Treatments $Gait Training: 23-37 mins $Therapeutic Exercise: 8-22 mins   PT G Codes:   PT G-Codes **NOT FOR INPATIENT CLASS** Functional Assessment Tool Used: clinical judgement Functional Limitation: Mobility: Walking and moving around Mobility: Walking and Moving Around Current Status (Y3888): At least 20 percent but less than 40 percent impaired, limited or restricted Mobility: Walking and Moving Around Goal Status [phone]): At least 1 percent but less than 20 percent impaired, limited or restricted Mobility: Walking and Moving Around Discharge Status [phone]): At least 1 percent but less than 20 percent impaired, limited or restricted    Steven Hale,KATHrine E 11/19/2014, 2:21 PM Carmelia Bake, PT, DPT 11/19/2014 Pager: [phone]

## 2014-11-19 NOTE — Op Note (Signed)
NAME: Kristan Votta    MEDICAL RECORD NO.: [id number]   FACILITY: [facility] BIRTH: September 20, 1956  PHYSICIAN: Pietro Cassis. Alvan Dame, M.D.    DATE OF PROCEDURE: 11/19/2014    OPERATIVE REPORT   PREOPERATIVE DIAGNOSIS: Right knee medial compartment osteoarthritis.   POSTOPERATIVE DIAGNOSIS: Right knee medial compartment osteoarthritis.  PROCEDURE: Rght partial knee replacement utilizing Biomet Oxford knee  component, size large femur, a right medial size C tibial tray with a size 6 mm insert.   SURGEON: Pietro Cassis. Alvan Dame, M.D.   ASSISTANT: Danae Orleans, PAC.  Please note that Mr. Guinevere Scarlet was present for the entirety of the case,  utilized for preoperative positioning, perioperative retractor  management, general facilitation of the case and primary wound closure.   ANESTHESIA: Spinal.   SPECIMENS: None.   COMPLICATIONS: None.  DRAINS: None   TOURNIQUET TIME: 39 minutes at 250 mmHg.   INDICATIONS FOR PROCEDURE: The patient is a 59 y.o. patient of mine who presented for evaluation of right knee pain.  They presented with primary complaints of pain on the medial side of their knee. Radiographs revealed advanced medial compartment arthritis with specifically an antero-medial wear pattern.  There was bone on bone changes noted with subchondral sclerosis and osteophytes present. The patient has had progressive problems failing to respond to conservative measures of medications, injections and activity modification. Risks of infection, DVT, component failure, need for future revision surgery were all discussed and reviewed.  Consent was obtained for benefit of pain relief.   PROCEDURE IN DETAIL: The patient was brought to the operative theater.  Once adequate anesthesia, preoperative antibiotics, 2gm of Ancef administered, the patient was positioned in supine position with a right thigh tourniquet  placed. The right lower extremity was prepped and draped in sterile  fashion with the leg on  the Oxford leg holder.  The leg was allowed to flex to 120 degrees. A time-out  was performed identifying the patient, planned procedure, and extremity.  The leg was exsanguinated, tourniquet elevated to 250 mmHg. A midline  incision was made from the proximal pole of the patella to the tibial tubercle. A  soft tissue plane was created and partial median arthrotomy was then  made to allow for subluxation of the patella. Following initial synovectomy and  debridement, the osteophytes were removed off the medial aspect of the  knee.   Attention was first directed to the tibia. The tibial  extramedullary guide was positioned over the anterior crest of the tibia  and pinned into position, and using a measured resection guide from the  [place] system, a 4 mm resection was made off the proximal tibia. First  the reciprocating saw along the medial aspect of the tibial spines, then the oscillating saw.    At this point, I sized this cut surface seem to be best fit for a size C tibial tray.  With the retractors out of the wound and the knee held at 90 degrees the 5 feeler gauge had appropriate tension on the medial ligament.   At this point, the femoral canal was opened with a drill and the  intramedullary rod passed. Then using the guide for a large posterior resection off  the posterior aspect of the femur was positioned over the mid portion of the medial femoral condyle.  The orientation was set using the guide that mates the femoral guide to the intramedullary rod.  The 2 drill holes were made into the distal femur.  The posterior guide was  then impacted into place and the posterior  femoral cut made.  At this point, I milled the distal femur with a size 5 spigot in place. At this point, we did a trial reduction of the large femur, size C tibial tray and a 5 then 6 feeler gauge. At 90 degrees of  flexion and at 20 degrees of flexion the knee had symmetric tension on  the ligaments.   Given  these findings, the trial femoral component was removed. Final preparation of tibia was carried out by pinning it in position. Then  using a reciprocating saw I removed bone for the keel. Further bone was  removed with an osteotome.  Trial reduction was now carried out with the large femur, the right medial keeled C tibia, and the 6 lollipop insert. The balance of the  ligaments appeared to be symmetric at 20 degrees and 90 degrees. Given  all these findings, the trial components were removed.   Cement was mixed. The final components were opened. The knee was irrigated with  normal saline solution. Then final debridements of the  soft tissue was carried out, I also drilled the sclerotic bone with a drill.  I injected the synovial capsular junction with Marcaine and NS with Toradol for total of 60cc.  The final components were cemented with a single batch of cement in a  two-stage technique with the tibial component cemented first. The knee  was then brought  to 45 degrees of flexion with a 6 feeler gauge, held with pressure for a minute and half.  After this the femoral component was cemented in place.  The knee was again held at 45 degrees of flexion while the cement fully cured.  Excess cement was removed throughout the knee. Tourniquet was let down  after 39 minutes. After the cement had fully cured and excessive cement  was removed throughout the knee there was no visualized cement present.   The final right medial size 6 insert to match the large femur was chosen and snapped into position. We re-irrigated  the knee. The extensor mechanism  was then reapproximated using a #1 Vicryl with the knee in flexion. The  remaining wound was closed with 2-0 Vicryl and a running 4-0 Monocryl.  The knee was cleaned, dried, and dressed sterilely using Dermabond and  Aquacel dressing. The patient  was brought to the recovery room, Ace wrap in place, tolerating the  procedure well.  We will initiate  physical therapy and progress to ambulate.     Pietro Cassis Alvan Dame, M.D.

## 2014-11-20 ENCOUNTER — Encounter (HOSPITAL_COMMUNITY): Payer: Self-pay | Admitting: Orthopedic Surgery

## 2014-11-26 ENCOUNTER — Other Ambulatory Visit: Payer: Self-pay | Admitting: Internal Medicine

## 2014-11-27 ENCOUNTER — Ambulatory Visit: Admit: 2014-11-27 | Payer: Self-pay

## 2014-11-27 ENCOUNTER — Ambulatory Visit (HOSPITAL_COMMUNITY)
Admission: RE | Admit: 2014-11-27 | Discharge: 2014-11-27 | Disposition: A | Discharge status: Home / Self Care | Payer: Commercial Managed Care - HMO | Source: Ambulatory Visit | Attending: Anesthesiology | Admitting: Anesthesiology

## 2014-11-27 SURGERY — RADIOLOGY WITH ANESTHESIA
Anesthesia: General

## 2014-11-29 ENCOUNTER — Other Ambulatory Visit: Payer: Self-pay | Admitting: Cardiology

## 2014-11-29 ENCOUNTER — Other Ambulatory Visit: Payer: Self-pay | Admitting: Internal Medicine

## 2014-11-29 ENCOUNTER — Other Ambulatory Visit: Payer: Self-pay | Admitting: Emergency Medicine

## 2014-11-29 ENCOUNTER — Telehealth: Payer: Self-pay | Admitting: Internal Medicine

## 2014-11-29 ENCOUNTER — Other Ambulatory Visit: Payer: Self-pay | Admitting: Cardiovascular Disease

## 2014-11-29 ENCOUNTER — Other Ambulatory Visit: Payer: Self-pay | Admitting: *Deleted

## 2014-11-29 MED ORDER — METOPROLOL TARTRATE 25 MG PO TABS: 25.0000 mg | ORAL_TABLET | Freq: Two times a day (BID) | ORAL | Status: DC

## 2014-11-29 NOTE — Telephone Encounter (Signed)
Left voice message to return call. Rx never prescribe in our clinic

## 2014-11-29 NOTE — Telephone Encounter (Signed)
Patient called to request a medication refill for metoprolol tartrate (LOPRESSOR) 25 MG tablet. Please f/u with pt.

## 2014-11-29 NOTE — Telephone Encounter (Signed)
Pt advised to make appointment with PCP for refills, Rx Metroprolol never Rx at this office

## 2014-11-30 ENCOUNTER — Other Ambulatory Visit: Payer: Self-pay | Admitting: *Deleted

## 2014-11-30 MED ORDER — METHOCARBAMOL 500 MG PO TABS: 500.0000 mg | ORAL_TABLET | Freq: Four times a day (QID) | ORAL | Status: DC | PRN

## 2014-12-01 ENCOUNTER — Other Ambulatory Visit: Payer: Self-pay | Admitting: Internal Medicine

## 2014-12-01 MED ORDER — METOPROLOL TARTRATE 25 MG PO TABS: 25.0000 mg | ORAL_TABLET | Freq: Two times a day (BID) | ORAL | Status: DC

## 2014-12-10 ENCOUNTER — Encounter: Payer: Self-pay | Admitting: Internal Medicine

## 2014-12-10 ENCOUNTER — Ambulatory Visit: Payer: Medicare HMO | Attending: Internal Medicine | Admitting: Internal Medicine

## 2014-12-10 VITALS — BP 122/84 | HR 71 | Temp 97.6°F | Resp 16 | Ht 69.0 in | Wt 218.0 lb

## 2014-12-10 DIAGNOSIS — Z9861 Coronary angioplasty status: Secondary | ICD-10-CM | POA: Diagnosis not present

## 2014-12-10 DIAGNOSIS — E7800 Pure hypercholesterolemia, unspecified: Secondary | ICD-10-CM | POA: Insufficient documentation

## 2014-12-10 DIAGNOSIS — Z951 Presence of aortocoronary bypass graft: Secondary | ICD-10-CM | POA: Insufficient documentation

## 2014-12-10 DIAGNOSIS — N529 Male erectile dysfunction, unspecified: Secondary | ICD-10-CM | POA: Insufficient documentation

## 2014-12-10 DIAGNOSIS — I739 Peripheral vascular disease, unspecified: Secondary | ICD-10-CM | POA: Diagnosis not present

## 2014-12-10 DIAGNOSIS — I10 Essential (primary) hypertension: Secondary | ICD-10-CM | POA: Diagnosis not present

## 2014-12-10 DIAGNOSIS — Z96651 Presence of right artificial knee joint: Secondary | ICD-10-CM | POA: Insufficient documentation

## 2014-12-10 DIAGNOSIS — Z23 Encounter for immunization: Secondary | ICD-10-CM | POA: Diagnosis not present

## 2014-12-10 DIAGNOSIS — Z7982 Long term (current) use of aspirin: Secondary | ICD-10-CM | POA: Insufficient documentation

## 2014-12-10 DIAGNOSIS — E291 Testicular hypofunction: Secondary | ICD-10-CM | POA: Diagnosis not present

## 2014-12-10 DIAGNOSIS — M545 Low back pain: Secondary | ICD-10-CM | POA: Diagnosis not present

## 2014-12-10 DIAGNOSIS — I252 Old myocardial infarction: Secondary | ICD-10-CM | POA: Insufficient documentation

## 2014-12-10 DIAGNOSIS — I2510 Atherosclerotic heart disease of native coronary artery without angina pectoris: Secondary | ICD-10-CM | POA: Insufficient documentation

## 2014-12-10 DIAGNOSIS — G8929 Other chronic pain: Secondary | ICD-10-CM | POA: Insufficient documentation

## 2014-12-10 DIAGNOSIS — R7989 Other specified abnormal findings of blood chemistry: Secondary | ICD-10-CM

## 2014-12-10 DIAGNOSIS — Z791 Long term (current) use of non-steroidal anti-inflammatories (NSAID): Secondary | ICD-10-CM | POA: Diagnosis not present

## 2014-12-10 DIAGNOSIS — Z8507 Personal history of malignant neoplasm of pancreas: Secondary | ICD-10-CM | POA: Diagnosis not present

## 2014-12-10 LAB — BASIC METABOLIC PANEL
BUN: 8 mg/dL (ref 6–23)
CHLORIDE: 101 meq/L (ref 96–112)
CO2: 26 meq/L (ref 19–32)
Calcium: 10.6 mg/dL — ABNORMAL HIGH (ref 8.4–10.5)
Creat: 0.92 mg/dL (ref 0.50–1.35)
Glucose, Bld: 108 mg/dL — ABNORMAL HIGH (ref 70–99)
Potassium: 4.9 mEq/L (ref 3.5–5.3)
SODIUM: 139 meq/L (ref 135–145)

## 2014-12-10 LAB — TESTOSTERONE: TESTOSTERONE: 96 ng/dL — AB (ref 300–890)

## 2014-12-10 LAB — POCT GLYCOSYLATED HEMOGLOBIN (HGB A1C): Hemoglobin A1C: 6.1

## 2014-12-10 MED ORDER — METOPROLOL TARTRATE 25 MG PO TABS: 25.0000 mg | ORAL_TABLET | Freq: Two times a day (BID) | ORAL | Status: DC

## 2014-12-10 MED ORDER — ATORVASTATIN CALCIUM 40 MG PO TABS: 20.0000 mg | ORAL_TABLET | Freq: Every day | ORAL | Status: DC

## 2014-12-10 NOTE — Progress Notes (Signed)
Patient ID: Steven Hale, male   DOB: 03-31-1956, 59 y.o.   MRN: [id number]   Steven Hale, is a 59 y.o. male  [id number]  [id number]  DOB - [birth year]  Chief Complaint  Patient presents with  . Follow-up  . Hypertension  . Medication Refill        Subjective:   Steven Hale is a 59 y.o. male here today for a follow up visit. Patient has extensive history of coronary artery disease with multiple interventions in the past ultimately requiring CABG in 09/2010, peripheral arterial disease, status post bilateral SFA stents in the past, hypertension, hyperlipidemia, pancreatic cyst status post surgery in 2009, chronic low back pain and low testosterone. Patient is here today for follow-up visit and for refill of his medications. He has no new complaints. He is compliant with his medications. Patient recently had his right knee replacement surgery about 2 weeks ago and he is doing well post op. He has followed up regularly with orthopedic surgery, he has an appointment tomorrow for colonoscopy. Patient reports no side effects to medications. He made this appointment because he could not get his atorvastatin and metoprolol refilled without doctor's visit. Patient has No headache, No chest pain, No abdominal pain - No Nausea, No new weakness tingling or numbness, No Cough - SOB.  Problem  Pure Hypercholesterolemia  Peripheral Vascular Disease  Essential Hypertension  Low Testosterone    ALLERGIES: No Known Allergies  PAST MEDICAL HISTORY: Past Medical History  Diagnosis Date  . PAD (peripheral artery disease)     a. s/p bilat SFA stents;  b. ABIs 11/2012: R 0.99, L 0.86.  [name] Hypertension   . Hyperlipidemia   . Anemia, unspecified   . GERD (gastroesophageal reflux disease)   . Helicobacter pylori (H. pylori) infection   . Hemorrhoids   . Impotence of organic origin   . Bell's palsy   . CAD (coronary artery disease)     a. s/p multiple PCIs;  b. s/p CABG in 09/2010 (LIMA-LAD,  SVG-OM1, SVG-distal RCA/OM2);  c.  Myoview 05/2012: EF 54%, no ischemia, no scar.   . Blood transfusion     during treatment for Ca  . DJD (degenerative joint disease)     low back & all over   . Pancreatic cancer     surgery 2009  . Myocardial infarction     2005 and 2012   . Chronic back pain     MEDICATIONS AT HOME: Prior to Admission medications   Medication Sig Start Date End Date Taking? Authorizing Provider  allopurinol (ZYLOPRIM) 100 MG tablet Take 100 mg by mouth daily.  09/14/14  Yes Historical Provider, MD  aspirin EC 81 MG tablet Take 1 tablet (81 mg total) by mouth daily. 11/19/14  Yes Lucille Passy Babish, PA-C  atorvastatin (LIPITOR) 40 MG tablet Take 0.5 tablets (20 mg total) by mouth daily. 12/10/14  Yes Tresa Garter, MD  celecoxib (CELEBREX) 200 MG capsule Take 1 capsule (200 mg total) by mouth 2 (two) times daily. 11/19/14  Yes Lucille Passy Babish, PA-C  cephALEXin (KEFLEX) 500 MG capsule Take 1 capsule (500 mg total) by mouth 3 (three) times daily. 11/19/14  Yes Lucille Passy Babish, PA-C  fish oil-omega-3 fatty acids 1000 MG capsule Take 1 capsule (1 g total) by mouth daily. 11/30/12  Yes Rosana Hoes, MD  lisinopril (PRINIVIL,ZESTRIL) 5 MG tablet TAKE 1/2 TABLET EVERY DAY 11/30/14  Yes Lelon Perla, MD  metoprolol tartrate (LOPRESSOR) 25 MG tablet Take 1  tablet (25 mg total) by mouth 2 (two) times daily. 12/10/14  Yes Tresa Garter, MD  Multiple Vitamin (MULTIVITAMIN) tablet Take 1 tablet by mouth daily. 11/30/12  Yes Rosana Hoes, MD  oxyCODONE (OXY IR/ROXICODONE) 5 MG immediate release tablet Take 1-3 tablets (5-15 mg total) by mouth every 4 (four) hours as needed for severe pain. 11/19/14  Yes Lucille Passy Babish, PA-C  tadalafil (CIALIS) 20 MG tablet Take 0.5-1 tablets (10-20 mg total) by mouth every other day as needed for erectile dysfunction. 09/13/13  Yes Ripudeep Krystal Eaton, MD  vitamin B-12 (CYANOCOBALAMIN) 100 MCG tablet Take 100 mcg by mouth daily.   Yes  Historical Provider, MD  clopidogrel (PLAVIX) 75 MG tablet TAKE 1 TABLET EVERY DAY Patient not taking: Reported on 12/10/2014 11/29/14   Wellington Hampshire, MD  methocarbamol (ROBAXIN) 500 MG tablet Take 1 tablet (500 mg total) by mouth every 6 (six) hours as needed for muscle spasms. Patient not taking: Reported on 12/10/2014 11/30/14   Tresa Garter, MD  nitroGLYCERIN (NITROSTAT) 0.4 MG SL tablet Place 1 tablet (0.4 mg total) under the tongue every 5 (five) minutes as needed for chest pain. 11/30/12   Rosana Hoes, MD  Propylene Glycol (SYSTANE BALANCE) 0.6 % SOLN Place 1 drop into both eyes daily.    Historical Provider, MD     Objective:   Filed Vitals:   12/10/14 1007  BP: 122/84  Pulse: 71  Temp: 97.6 F (36.4 C)  TempSrc: Oral  Resp: 16  Height: 5\' 9"  (1.753 m)  Weight: 218 lb (98.884 kg)  SpO2: 97%    Exam General appearance : Awake, alert, not in any distress. Speech Clear. Not toxic looking HEENT: Atraumatic and Normocephalic, pupils equally reactive to light and accomodation Neck: supple, no JVD. No cervical lymphadenopathy.  Chest:Good air entry bilaterally, no added sounds  CVS: S1 S2 regular, no murmurs.  Abdomen: Bowel sounds present, Non tender and not distended with no gaurding, rigidity or rebound. Extremities: B/L Lower Ext shows no edema, both legs are warm to touch Neurology: Awake alert, and oriented X 3, CN II-XII intact, Non focal Skin:No Rash   Data Review Lab Results  Component Value Date   HGBA1C 5.8 08/30/2013   HGBA1C 6.0* 04/17/2013   HGBA1C * 09/16/2010    6.4 (NOTE)                                                                       According to the ADA Clinical Practice Recommendations for 2011, when HbA1c is used as a screening test:   >=6.5%   Diagnostic of Diabetes Mellitus           (if abnormal result  is confirmed)  5.7-6.4%   Increased risk of developing Diabetes Mellitus  References:Diagnosis and Classification of Diabetes  [place],Diabetes BTDV,7616,07(PXTGG 1):S62-S69 and Standards of Medical Care in         Diabetes - 2011,Diabetes Care,2011,34  (Suppl 1):S11-S61.     Assessment & Plan   1. Pure hypercholesterolemia Refill - atorvastatin (LIPITOR) 40 MG tablet; Take 0.5 tablets (20 mg total) by mouth daily.  Dispense: 90 tablet; Refill: 3  2. Peripheral vascular disease Refill - atorvastatin (LIPITOR) 40 MG tablet; Take 0.5 tablets (  20 mg total) by mouth daily.  Dispense: 90 tablet; Refill: 3  3. Essential hypertension Refill - metoprolol tartrate (LOPRESSOR) 25 MG tablet; Take 1 tablet (25 mg total) by mouth 2 (two) times daily.  Dispense: 180 tablet; Refill: 3  - Basic Metabolic Panel - POCT glycosylated hemoglobin (Hb A1C)  4. Low testosterone  - Testosterone   Patient was counseled extensively about nutrition and exercise   Return in about 3 months (around 03/11/2015), or if symptoms worsen or fail to improve, for Follow up HTN, Follow up Pain and comorbidities.  The patient was given clear instructions to go to ER or return to medical center if symptoms don't improve, worsen or new problems develop. The patient verbalized understanding. The patient was told to call to get lab results if they haven't heard anything in the next week.   This note has been created with Surveyor, quantity. Any transcriptional errors are unintentional.    Angelica Chessman, MD, [place], [place], [place] [facility] [place], [place]   12/10/2014, 10:50 AM

## 2014-12-10 NOTE — Patient Instructions (Signed)
DASH Eating Plan DASH stands for "Dietary Approaches to Stop Hypertension." The DASH eating plan is a healthy eating plan that has been shown to reduce high blood pressure (hypertension). Additional health benefits may include reducing the risk of type 2 diabetes mellitus, heart disease, and stroke. The DASH eating plan may also help with weight loss. WHAT DO I NEED TO KNOW ABOUT THE DASH EATING PLAN? For the DASH eating plan, you will follow these general guidelines:  Choose foods with a percent daily value for sodium of less than 5% (as listed on the food label).  Use salt-free seasonings or herbs instead of table salt or sea salt.  Check with your health care provider or pharmacist before using salt substitutes.  Eat lower-sodium products, often labeled as "lower sodium" or "no salt added."  Eat fresh foods.  Eat more vegetables, fruits, and low-fat dairy products.  Choose whole grains. Look for the word "whole" as the first word in the ingredient list.  Choose fish and skinless chicken or turkey more often than red meat. Limit fish, poultry, and meat to 6 oz (170 g) each day.  Limit sweets, desserts, sugars, and sugary drinks.  Choose heart-healthy fats.  Limit cheese to 1 oz (28 g) per day.  Eat more home-cooked food and less restaurant, buffet, and fast food.  Limit fried foods.  Cook foods using methods other than frying.  Limit canned vegetables. If you do use them, rinse them well to decrease the sodium.  When eating at a restaurant, ask that your food be prepared with less salt, or no salt if possible. WHAT FOODS CAN I EAT? Seek help from a dietitian for individual calorie needs. Grains Whole grain or whole wheat bread. Brown rice. Whole grain or whole wheat pasta. Quinoa, bulgur, and whole grain cereals. Low-sodium cereals. Corn or whole wheat flour tortillas. Whole grain cornbread. Whole grain crackers. Low-sodium crackers. Vegetables Fresh or frozen vegetables  (raw, steamed, roasted, or grilled). Low-sodium or reduced-sodium tomato and vegetable juices. Low-sodium or reduced-sodium tomato sauce and paste. Low-sodium or reduced-sodium canned vegetables.  Fruits All fresh, canned (in natural juice), or frozen fruits. Meat and Other Protein Products Ground beef (85% or leaner), grass-fed beef, or beef trimmed of fat. Skinless chicken or turkey. Ground chicken or turkey. Pork trimmed of fat. All fish and seafood. Eggs. Dried beans, peas, or lentils. Unsalted nuts and seeds. Unsalted canned beans. Dairy Low-fat dairy products, such as skim or 1% milk, 2% or reduced-fat cheeses, low-fat ricotta or cottage cheese, or plain low-fat yogurt. Low-sodium or reduced-sodium cheeses. Fats and Oils Tub margarines without trans fats. Light or reduced-fat mayonnaise and salad dressings (reduced sodium). Avocado. Safflower, olive, or canola oils. Natural peanut or almond butter. Other Unsalted popcorn and pretzels. The items listed above may not be a complete list of recommended foods or beverages. Contact your dietitian for more options. WHAT FOODS ARE NOT RECOMMENDED? Grains White bread. White pasta. White rice. Refined cornbread. Bagels and croissants. Crackers that contain trans fat. Vegetables Creamed or fried vegetables. Vegetables in a cheese sauce. Regular canned vegetables. Regular canned tomato sauce and paste. Regular tomato and vegetable juices. Fruits Dried fruits. Canned fruit in light or heavy syrup. Fruit juice. Meat and Other Protein Products Fatty cuts of meat. Ribs, chicken wings, bacon, sausage, bologna, salami, chitterlings, fatback, hot dogs, bratwurst, and packaged luncheon meats. Salted nuts and seeds. Canned beans with salt. Dairy Whole or 2% milk, cream, half-and-half, and cream cheese. Whole-fat or sweetened yogurt. Full-fat   cheeses or blue cheese. Nondairy creamers and whipped toppings. Processed cheese, cheese spreads, or cheese  curds. Condiments Onion and garlic salt, seasoned salt, table salt, and sea salt. Canned and packaged gravies. Worcestershire sauce. Tartar sauce. Barbecue sauce. Teriyaki sauce. Soy sauce, including reduced sodium. Steak sauce. Fish sauce. Oyster sauce. Cocktail sauce. Horseradish. Ketchup and mustard. Meat flavorings and tenderizers. Bouillon cubes. Hot sauce. Tabasco sauce. Marinades. Taco seasonings. Relishes. Fats and Oils Butter, stick margarine, lard, shortening, ghee, and bacon fat. Coconut, palm kernel, or palm oils. Regular salad dressings. Other Pickles and olives. Salted popcorn and pretzels. The items listed above may not be a complete list of foods and beverages to avoid. Contact your dietitian for more information. WHERE CAN I FIND MORE INFORMATION? National Heart, Lung, and Blood Institute: www.nhlbi.nih.gov/health/health-topics/topics/dash/ Document Released: 10/22/2011 Document Revised: 03/19/2014 Document Reviewed: 09/06/2013 ExitCare Patient Information 2015 Exit[employer]. This information is not intended to replace advice given to you by your health care provider. Make sure you discuss any questions you have with your health care provider. Hypertension Hypertension, commonly called high blood pressure, is when the force of blood pumping through your arteries is too strong. Your arteries are the blood vessels that carry blood from your heart throughout your body. A blood pressure reading consists of a higher number over a lower number, such as 110/72. The higher number (systolic) is the pressure inside your arteries when your heart pumps. The lower number (diastolic) is the pressure inside your arteries when your heart relaxes. Ideally you want your blood pressure below 120/80. Hypertension forces your heart to work harder to pump blood. Your arteries may become narrow or stiff. Having hypertension puts you at risk for heart disease, stroke, and other problems.  RISK  FACTORS Some risk factors for high blood pressure are controllable. Others are not.  Risk factors you cannot control include:   Race. You may be at higher risk if you are African American.  Age. Risk increases with age.  Gender. Men are at higher risk than women before age 45 years. After age 65, women are at higher risk than men. Risk factors you can control include:  Not getting enough exercise or physical activity.  Being overweight.  Getting too much fat, sugar, calories, or salt in your diet.  Drinking too much alcohol. SIGNS AND SYMPTOMS Hypertension does not usually cause signs or symptoms. Extremely high blood pressure (hypertensive crisis) may cause headache, anxiety, shortness of breath, and nosebleed. DIAGNOSIS  To check if you have hypertension, your health care provider will measure your blood pressure while you are seated, with your arm held at the level of your heart. It should be measured at least twice using the same arm. Certain conditions can cause a difference in blood pressure between your right and left arms. A blood pressure reading that is higher than normal on one occasion does not mean that you need treatment. If one blood pressure reading is high, ask your health care provider about having it checked again. TREATMENT  Treating high blood pressure includes making lifestyle changes and possibly taking medicine. Living a healthy lifestyle can help lower high blood pressure. You may need to change some of your habits. Lifestyle changes may include:  Following the DASH diet. This diet is high in fruits, vegetables, and whole grains. It is low in salt, red meat, and added sugars.  Getting at least 2 hours of brisk physical activity every week.  Losing weight if necessary.  Not smoking.  Limiting   alcoholic beverages.  Learning ways to reduce stress. If lifestyle changes are not enough to get your blood pressure under control, your health care provider may  prescribe medicine. You may need to take more than one. Work closely with your health care provider to understand the risks and benefits. HOME CARE INSTRUCTIONS  Have your blood pressure rechecked as directed by your health care provider.   Take medicines only as directed by your health care provider. Follow the directions carefully. Blood pressure medicines must be taken as prescribed. The medicine does not work as well when you skip doses. Skipping doses also puts you at risk for problems.   Do not smoke.   Monitor your blood pressure at home as directed by your health care provider. SEEK MEDICAL CARE IF:   You think you are having a reaction to medicines taken.  You have recurrent headaches or feel dizzy.  You have swelling in your ankles.  You have trouble with your vision. SEEK IMMEDIATE MEDICAL CARE IF:  You develop a severe headache or confusion.  You have unusual weakness, numbness, or feel faint.  You have severe chest or abdominal pain.  You vomit repeatedly.  You have trouble breathing. MAKE SURE YOU:   Understand these instructions.  Will watch your condition.  Will get help right away if you are not doing well or get worse. Document Released: 11/02/2005 Document Revised: 03/19/2014 Document Reviewed: 08/25/2013 ExitCare Patient Information 2015 [employer]. This information is not intended to replace advice given to you by your health care provider. Make sure you discuss any questions you have with your health care provider.  

## 2014-12-10 NOTE — Progress Notes (Signed)
Pt comes in for 3 month follow up HTN with medical management Pt is taking medication as directed Denies headache,blurry vision or dizziness Need med refills sent to [facility] Need Flu/PNA vaccine

## 2014-12-11 ENCOUNTER — Telehealth: Payer: Self-pay | Admitting: *Deleted

## 2014-12-11 NOTE — Telephone Encounter (Signed)
Patient did not hold plavix, rescheduled his appointment till 12/20/2014  Patient aware he has to stop plavix 7 days before his procedure

## 2014-12-12 ENCOUNTER — Ambulatory Visit (HOSPITAL_COMMUNITY): Payer: Medicare HMO | Attending: Cardiovascular Disease | Admitting: Cardiology

## 2014-12-12 ENCOUNTER — Other Ambulatory Visit: Payer: Self-pay | Admitting: Internal Medicine

## 2014-12-12 ENCOUNTER — Ambulatory Visit (HOSPITAL_COMMUNITY): Payer: Commercial Managed Care - HMO

## 2014-12-12 ENCOUNTER — Encounter: Payer: Commercial Managed Care - HMO | Admitting: Gastroenterology

## 2014-12-12 DIAGNOSIS — I739 Peripheral vascular disease, unspecified: Secondary | ICD-10-CM | POA: Diagnosis not present

## 2014-12-12 MED ORDER — TESTOSTERONE CYPIONATE 200 MG/ML IM SOLN: 200.0000 mg | INTRAMUSCULAR | Status: DC

## 2014-12-12 NOTE — Progress Notes (Signed)
ABI and bilateral lower arterial Duplex performed

## 2014-12-13 ENCOUNTER — Encounter (HOSPITAL_COMMUNITY): Payer: Commercial Managed Care - HMO

## 2014-12-18 ENCOUNTER — Telehealth: Payer: Self-pay | Admitting: Emergency Medicine

## 2014-12-18 NOTE — Telephone Encounter (Signed)
-----   Message from Tresa Garter, MD sent at 12/12/2014  4:49 PM EST ----- Please inform patient that his testosterone level is low, may need to go back on the injection testosterone monthly.  I have ordered the injections, patient is to bring the vial to clinic for injections

## 2014-12-18 NOTE — Telephone Encounter (Signed)
Pt given test results with scheduled nurse visit 12/27/14 @ 3pm Pt instructed to bring vial to clinic for injection

## 2014-12-19 ENCOUNTER — Other Ambulatory Visit: Payer: Self-pay | Admitting: Radiology

## 2014-12-19 DIAGNOSIS — I739 Peripheral vascular disease, unspecified: Secondary | ICD-10-CM

## 2014-12-20 ENCOUNTER — Ambulatory Visit (AMBULATORY_SURGERY_CENTER): Payer: Medicare HMO | Admitting: Gastroenterology

## 2014-12-20 ENCOUNTER — Encounter: Payer: Self-pay | Admitting: Gastroenterology

## 2014-12-20 VITALS — BP 123/76 | HR 61 | Temp 96.1°F | Resp 16 | Ht 69.0 in | Wt 218.0 lb

## 2014-12-20 DIAGNOSIS — Z1211 Encounter for screening for malignant neoplasm of colon: Secondary | ICD-10-CM

## 2014-12-20 MED ORDER — SODIUM CHLORIDE 0.9 % IV SOLN: 500.0000 mL | INTRAVENOUS | Status: DC

## 2014-12-20 NOTE — Op Note (Signed)
[facility]  [address] [place], [id number]   COLONOSCOPY PROCEDURE REPORT  PATIENT: Steven Hale, Steven Hale  MR#: [id number] BIRTHDATE: Jan 25, 1956 , 58  yrs. old GENDER: male ENDOSCOPIST: Inda Castle, MD REFERRED BY: PROCEDURE DATE:  12/20/2014 PROCEDURE:   Colonoscopy, diagnostic First Screening Colonoscopy - Avg.  risk and is 50 yrs.  old or older Yes.  Prior Negative Screening - Now for repeat screening. N/A  History of Adenoma - Now for follow-up colonoscopy & has been > or = to 3 yrs.  N/A  Polyps Removed Today? No.  Recommend repeat exam, <10 yrs? No. ASA CLASS:   Class III INDICATIONS:first colonoscopy and average risk for colon cancer. MEDICATIONS: Monitored anesthesia care, Propofol 400 mg IV, and Lidocaine 40 g IV  DESCRIPTION OF PROCEDURE:   After the risks benefits and alternatives of the procedure were thoroughly explained, informed consent was obtained.  The digital rectal exam revealed no abnormalities of the rectum.   The [id number]  endoscope was introduced through the anus and advanced to the cecum, which was identified by both the appendix and ileocecal valve. No adverse events experienced.   The quality of the prep was  fair, using Suprep.  The instrument was then slowly withdrawn as the colon was fully examined.      COLON FINDINGS: A normal appearing cecum, ileocecal valve, and appendiceal orifice were identified.  the ascending, transverse, descending, sigmoid colon, and rectum appeared unremarkable. Retroflexed views revealed no abnormalities. The time to cecum=7 minutes 33 seconds.  Withdrawal time=8 minutes 03 seconds.  The scope was withdrawn and the procedure completed. COMPLICATIONS: There were no complications.  ENDOSCOPIC IMPRESSION: Normal colonoscopy  RECOMMENDATIONS: Continue current colorectal screening recommendations for "routine risk" patients with a repeat colonoscopy in 10 years.  eSigned:  Inda Castle, MD 12/20/2014 12:51 PM   cc: Angelica Chessman MD

## 2014-12-20 NOTE — Progress Notes (Signed)
Stable to RR 

## 2014-12-20 NOTE — Patient Instructions (Signed)
YOU HAD AN ENDOSCOPIC PROCEDURE TODAY AT THE [facility]: Refer to the procedure report that was given to you for any specific questions about what was found during the examination.  If the procedure report does not answer your questions, please call your gastroenterologist to clarify.  If you requested that your care partner not be given the details of your procedure findings, then the procedure report has been included in a sealed envelope for you to review at your convenience later.  YOU SHOULD EXPECT: Some feelings of bloating in the abdomen. Passage of more gas than usual.  Walking can help get rid of the air that was put into your GI tract during the procedure and reduce the bloating. If you had a lower endoscopy (such as a colonoscopy or flexible sigmoidoscopy) you may notice spotting of blood in your stool or on the toilet paper. If you underwent a bowel prep for your procedure, then you may not have a normal bowel movement for a few days.  DIET: Your first meal following the procedure should be a light meal and then it is ok to progress to your normal diet.  A half-sandwich or bowl of soup is an example of a good first meal.  Heavy or fried foods are harder to digest and may make you feel nauseous or bloated.  Likewise meals heavy in dairy and vegetables can cause extra gas to form and this can also increase the bloating.  Drink plenty of fluids but you should avoid alcoholic beverages for 24 hours.  ACTIVITY: Your care partner should take you home directly after the procedure.  You should plan to take it easy, moving slowly for the rest of the day.  You can resume normal activity the day after the procedure however you should NOT DRIVE or use heavy machinery for 24 hours (because of the sedation medicines used during the test).    SYMPTOMS TO REPORT IMMEDIATELY: A gastroenterologist can be reached at any hour.  During normal business hours, 8:30 AM to 5:00 PM Monday through Friday,  call [phone].  After hours and on weekends, please call the GI answering service at [phone] who will take a message and have the physician on call contact you.   Following lower endoscopy (colonoscopy or flexible sigmoidoscopy):  Excessive amounts of blood in the stool  Significant tenderness or worsening of abdominal pains  Swelling of the abdomen that is new, acute  Fever of 100F or higher  FOLLOW UP: If any biopsies were taken you will be contacted by phone or by letter within the next 1-3 weeks.  Call your gastroenterologist if you have not heard about the biopsies in 3 weeks.  Our staff will call the home number listed on your records the next business day following your procedure to check on you and address any questions or concerns that you may have at that time regarding the information given to you following your procedure. This is a courtesy call and so if there is no answer at the home number and we have not heard from you through the emergency physician on call, we will assume that you have returned to your regular daily activities without incident.  SIGNATURES/CONFIDENTIALITY: You and/or your care partner have signed paperwork which will be entered into your electronic medical record.  These signatures attest to the fact that that the information above on your After Visit Summary has been reviewed and is understood.  Full responsibility of the confidentiality of this   discharge information lies with you and/or your care-partner.  Normal exam.  Repeat colonoscopy in 10 years-2026.

## 2014-12-21 ENCOUNTER — Telehealth: Payer: Self-pay | Admitting: *Deleted

## 2014-12-21 NOTE — Telephone Encounter (Signed)
  Follow up Call-  Call back number 12/20/2014  Post procedure Call Back phone  # [phone] cell  Permission to leave phone message Yes     Patient questions:  Do you have a fever, pain , or abdominal swelling? No. Pain Score  0 *  Have you tolerated food without any problems? Yes.    Have you been able to return to your normal activities? Yes.    Do you have any questions about your discharge instructions: Diet   No. Medications  No. Follow up visit  No.  Do you have questions or concerns about your Care? No.  Actions: * If pain score is 4 or above: No action needed, pain <4.

## 2014-12-27 ENCOUNTER — Ambulatory Visit: Payer: Self-pay

## 2014-12-28 ENCOUNTER — Other Ambulatory Visit: Payer: Self-pay | Admitting: Emergency Medicine

## 2014-12-28 ENCOUNTER — Ambulatory Visit: Payer: Medicare HMO | Attending: Internal Medicine | Admitting: *Deleted

## 2014-12-28 ENCOUNTER — Telehealth: Payer: Self-pay | Admitting: Internal Medicine

## 2014-12-28 DIAGNOSIS — E291 Testicular hypofunction: Secondary | ICD-10-CM | POA: Diagnosis not present

## 2014-12-28 DIAGNOSIS — R7989 Other specified abnormal findings of blood chemistry: Secondary | ICD-10-CM

## 2014-12-28 MED ORDER — TESTOSTERONE CYPIONATE 100 MG/ML IM SOLN
200.0000 mg | INTRAMUSCULAR | Status: DC
  Administered 2014-12-28 – 2015-02-22 (×3): 200 mg via INTRAMUSCULAR

## 2014-12-28 MED ORDER — TESTOSTERONE CYPIONATE 200 MG/ML IM SOLN: 200.0000 mg | INTRAMUSCULAR | Status: DC

## 2014-12-28 NOTE — Telephone Encounter (Signed)
Left message on patient's VM to return call to discuss rx for testosterone. Rx was printed for pt to take with him on 12/28/14

## 2014-12-28 NOTE — Telephone Encounter (Signed)
Spoke with [facility] pharmacist Medication ordered/verbal order

## 2014-12-28 NOTE — Progress Notes (Signed)
Patient presents for Testosterone injection States feeling well Next injection due 01/25/2015

## 2014-12-28 NOTE — Telephone Encounter (Signed)
Daphne from outpatient pharmacy is calling because this patient was told that he would be prescribed testosterone cypionate (DEPOTESTOTERONE CYPIONATE) 200 MG/ML injection but they do not have it at their pharmacy.

## 2015-01-09 ENCOUNTER — Telehealth: Payer: Self-pay | Admitting: Internal Medicine

## 2015-01-09 NOTE — Telephone Encounter (Signed)
Pt is calling requesting refills on testosterone cypionate (DEPOTESTOTERONE CYPIONATE) injection 200 mg, pharmacy was unable to fill insurance is requiring prior auth [phone] [facility]pr). Please f/u with pt

## 2015-01-17 ENCOUNTER — Other Ambulatory Visit: Payer: Self-pay | Admitting: Internal Medicine

## 2015-01-17 NOTE — Telephone Encounter (Signed)
Pt is calling requesting refills on testosterone cypionate (DEPOTESTOTERONE CYPIONATE) injection 200 mg, pharmacy was unable to fill insurance is requiring prior auth [phone] [facility]cpr). Please f/u with pt

## 2015-01-17 NOTE — Telephone Encounter (Signed)
Left voice message, patient has refills at the pharmacy

## 2015-01-25 ENCOUNTER — Other Ambulatory Visit: Payer: Self-pay

## 2015-01-25 ENCOUNTER — Ambulatory Visit: Payer: Commercial Managed Care - HMO | Attending: Internal Medicine

## 2015-01-25 NOTE — Progress Notes (Unsigned)
Patient here for scheduled testosterone injection.  Injection given in right ventrogluteal area.  Patient tolerate well and knows to schedule appointment for next injection before he leaves.

## 2015-01-29 ENCOUNTER — Encounter: Payer: Self-pay | Admitting: Sports Medicine

## 2015-02-19 ENCOUNTER — Ambulatory Visit (HOSPITAL_COMMUNITY): Payer: Medicare HMO | Attending: Cardiovascular Disease | Admitting: Cardiology

## 2015-02-19 ENCOUNTER — Ambulatory Visit (INDEPENDENT_AMBULATORY_CARE_PROVIDER_SITE_OTHER): Payer: Medicare HMO | Admitting: Cardiovascular Disease

## 2015-02-19 ENCOUNTER — Encounter: Payer: Self-pay | Admitting: Cardiovascular Disease

## 2015-02-19 VITALS — BP 115/82 | HR 64 | Ht 69.0 in | Wt 220.1 lb

## 2015-02-19 DIAGNOSIS — I2510 Atherosclerotic heart disease of native coronary artery without angina pectoris: Secondary | ICD-10-CM

## 2015-02-19 DIAGNOSIS — I10 Essential (primary) hypertension: Secondary | ICD-10-CM | POA: Diagnosis not present

## 2015-02-19 DIAGNOSIS — I739 Peripheral vascular disease, unspecified: Secondary | ICD-10-CM

## 2015-02-19 DIAGNOSIS — E785 Hyperlipidemia, unspecified: Secondary | ICD-10-CM | POA: Diagnosis not present

## 2015-02-19 NOTE — Progress Notes (Signed)
HPI: This is a 59 year old male who is here today for a followup visit regarding  peripheral arterial disease. He has known history of coronary artery disease with multiple interventions in the past. Cardiac catheterization in November of 2011 showed severe three-vessel coronary disease and he underwent coronary artery bypass graft surgery. He also had SFA stents placed bilaterally years ago. He quit smoking in 2009.  He was seen in 08/2013 for significant worsening of Claudication and cramping of left calf.  I proceeded with angiography at that time which showed an occluded distal left SFA starting above the previously placed stent. I performed directional atherectomy and balloon angioplasty which established normal flow.Post procedure ABI was normal.   He was seen in 08/2014 for bilateral calf claudication worse on the left after walking just 3 minutes on treadmill. There was evidence of restenosis.  I proceeded with angiography in 08/2014 which showed:  1. No significant aortoiliac disease. No significant obstructive disease affecting the right lower extremity with patent SFA stent.  2. Significant restenosis in the distal left SFA with two-vessel runoff below the knee.  3. Successful drug coated balloon angioplasty to the distal left SFA  No claudication since that time. He can walk half a mile without significant limitations. ABI today is 0.97 bilaterally with no significant obstructive disease.  No Known Allergies   Current Outpatient Prescriptions on File Prior to Visit  Medication Sig Dispense Refill  . allopurinol (ZYLOPRIM) 100 MG tablet Take 100 mg by mouth daily.   0  . aspirin EC 81 MG tablet Take 1 tablet (81 mg total) by mouth daily.    [name] atorvastatin (LIPITOR) 40 MG tablet Take 0.5 tablets (20 mg total) by mouth daily. 90 tablet 3  . celecoxib (CELEBREX) 200 MG capsule Take 1 capsule (200 mg total) by mouth 2 (two) times daily. 60 capsule 0  . fish oil-omega-3 fatty acids  1000 MG capsule Take 1 capsule (1 g total) by mouth daily. 60 capsule 1  . gabapentin (NEURONTIN) 100 MG capsule Take 200 mg by mouth 3 (three) times daily.    [name] lisinopril (PRINIVIL,ZESTRIL) 5 MG tablet TAKE 1/2 TABLET EVERY DAY 45 tablet 0  . metoprolol tartrate (LOPRESSOR) 25 MG tablet Take 1 tablet (25 mg total) by mouth 2 (two) times daily. 180 tablet 3  . Multiple Vitamin (MULTIVITAMIN) tablet Take 1 tablet by mouth daily. 30 tablet 10  . oxyCODONE (OXY IR/ROXICODONE) 5 MG immediate release tablet Take 1-3 tablets (5-15 mg total) by mouth every 4 (four) hours as needed for severe pain. 120 tablet 0  . tadalafil (CIALIS) 20 MG tablet Take 0.5-1 tablets (10-20 mg total) by mouth every other day as needed for erectile dysfunction. 5 tablet 11  . testosterone cypionate (DEPOTESTOTERONE CYPIONATE) 200 MG/ML injection Inject 1 mL (200 mg total) into the muscle every 28 (twenty-eight) days. 10 mL 5  . vitamin B-12 (CYANOCOBALAMIN) 100 MCG tablet Take 100 mcg by mouth daily.     Current Facility-Administered Medications on File Prior to Visit  Medication Dose Route Frequency Provider Last Rate Last Dose  . testosterone cypionate (DEPOTESTOTERONE CYPIONATE) injection 200 mg  200 mg Intramuscular Q28 days Tresa Garter, MD   200 mg at 01/25/15 [id number]     Past Medical History  Diagnosis Date  . PAD (peripheral artery disease)     a. s/p bilat SFA stents;  b. ABIs 11/2012: R 0.99, L 0.86.  [name] Hypertension   . Hyperlipidemia   .  Anemia, unspecified   . GERD (gastroesophageal reflux disease)   . Helicobacter pylori (H. pylori) infection   . Hemorrhoids   . Impotence of organic origin   . Bell's palsy   . CAD (coronary artery disease)     a. s/p multiple PCIs;  b. s/p CABG in 09/2010 (LIMA-LAD, SVG-OM1, SVG-distal RCA/OM2);  c.  Myoview 05/2012: EF 54%, no ischemia, no scar.   . Blood transfusion     during treatment for Ca  . DJD (degenerative joint disease)     low back & all over   .  Pancreatic cancer     surgery 2009  . Myocardial infarction     2005 and 2012   . Chronic back pain   . Blood transfusion without reported diagnosis      Past Surgical History  Procedure Laterality Date  . Pancreatic  cancer  05/10/2008    Resection of distal panrease and spleen  . Splenectomy    . Back surgery      1992  . Peripheral vascular catheterization  Oct 2014    Lt SFA PTA  . Hemorrhoid surgery  10/30/2011    Procedure: HEMORRHOIDECTOMY;  Surgeon: Harl Bowie, MD;  Location: [facility];  Service: General;  Laterality: N/A;  . Peripheral vascular catheterization  Oct 2015    ISR Lt SFA-PTA  . Abdominal aortagram N/A 09/13/2013    Procedure: ABDOMINAL AORTAGRAM;  Surgeon: Wellington Hampshire, MD;  Location: [facility] CATH LAB;  Service: Cardiovascular;  Laterality: N/A;  . Thrombectomy femoral artery Left 09/13/2013    Procedure: THROMBECTOMY FEMORAL ARTERY;  Surgeon: Wellington Hampshire, MD;  Location: [facility] CATH LAB;  Service: Cardiovascular;  Laterality: Left;  . Abdominal aortagram N/A 08/29/2014    Procedure: ABDOMINAL AORTAGRAM;  Surgeon: Wellington Hampshire, MD;  Location: [facility] CATH LAB;  Service: Cardiovascular;  Laterality: N/A;  . Coronary artery bypass graft  nov 2011    x 4  . Partial knee arthroplasty Right 11/19/2014    Procedure: RIGHT UNI-KNEE ARTHROPLASTY MEDIALLY;  Surgeon: Mauri Pole, MD;  Location: [facility];  Service: Orthopedics;  Laterality: Right;  . Right partial knee replacement       Family History  Problem Relation Age of Onset  . Heart attack Father     died of MI at age 74  . Anesthesia problems Neg Hx   . Hypotension Neg Hx   . Malignant hyperthermia Neg Hx   . Pseudochol deficiency Neg Hx   . Colon cancer Neg Hx   . Esophageal cancer Neg Hx   . Prostate cancer Neg Hx   . Rectal cancer Neg Hx   . Stomach cancer Neg Hx      History   Social History  . Marital Status: Single    Spouse Name: N/A  . Number of Children: 0  . Years of Education:  N/A   Occupational History  . Diability    Social History Main Topics  . Smoking status: Former Smoker    Types: Cigarettes    Quit date: 11/29/2008  . Smokeless tobacco: Never Used  . Alcohol Use: 1.2 oz/week    2 Shots of liquor per week     Comment: 2 drinks of brandy every week   . Drug Use: No  . Sexual Activity: Not Currently   Other Topics Concern  . 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 He appears well-developed and well-nourished. No distress.  HENT: No nasal discharge.  Head: Normocephalic and atraumatic.  Eyes: Pupils are equal and round. Right eye exhibits no discharge. Left eye exhibits no discharge.  Neck: Normal range of motion. Neck supple. No JVD present. No thyromegaly present.  Cardiovascular: Normal rate, regular rhythm, normal heart sounds and. Exam reveals no gallop and no friction rub. No murmur heard.  Pulmonary/Chest: Effort normal and breath sounds normal. No stridor. No respiratory distress. He has no wheezes. He has no rales. He exhibits no tenderness.  Abdominal: Soft. Bowel sounds are normal. He exhibits no distension. There is no tenderness. There is no rebound and no guarding.  Musculoskeletal: Normal range of motion. He exhibits no edema and no tenderness.  Neurological: He is alert and oriented to person, place, and time. Coordination normal.  Skin: Skin is warm and dry. No rash noted. He is not diaphoretic. No erythema. No pallor.  Psychiatric: He has a normal mood and affect. His behavior is normal. Judgment and thought content normal.  Vascular: Femoral pulses are slightly diminished bilaterally. PT: +1 in the right side and +1 on the left side, DP: + 1 On  the right side and 0 on the left side.      ASSESSMENT AND PLAN

## 2015-02-19 NOTE — Progress Notes (Signed)
ABI + Bilateral lower arterial Duplex performed

## 2015-02-19 NOTE — Assessment & Plan Note (Signed)
Continue treatment with atorvastatin with a target LDL of less than 70. 

## 2015-02-19 NOTE — Assessment & Plan Note (Signed)
He is doing well overall with no recurrent claudication. No significant restenosis on today's noninvasive evaluation. Continue aggressive medical therapy.

## 2015-02-19 NOTE — Assessment & Plan Note (Signed)
He reports no symptoms of angina. Continue medical therapy.

## 2015-02-19 NOTE — Patient Instructions (Signed)
Your physician has requested that you have a lower extremity arterial duplex in 6 MONTHS. This test is an ultrasound of the arteries in the legs. It looks at arterial blood flow in the legs. Allow one hour for Lower Arterial scans. There are no restrictions or special instructions  Your physician recommends that you continue on your current medications as directed. Please refer to the Current Medication list given to you today.  Your physician wants you to follow-up in: 6 MONTHS with Dr Fletcher Anon.  You will receive a reminder letter in the mail two months in advance. If you don't receive a letter, please call our office to schedule the follow-up appointment.

## 2015-02-19 NOTE — Assessment & Plan Note (Signed)
Blood pressure is controlled on current medications. 

## 2015-02-21 ENCOUNTER — Ambulatory Visit: Payer: Self-pay | Admitting: Cardiovascular Disease

## 2015-02-21 ENCOUNTER — Encounter (HOSPITAL_COMMUNITY): Payer: Self-pay

## 2015-02-22 ENCOUNTER — Ambulatory Visit: Payer: Medicare HMO | Attending: Internal Medicine | Admitting: *Deleted

## 2015-02-22 DIAGNOSIS — Z79890 Hormone replacement therapy (postmenopausal): Secondary | ICD-10-CM | POA: Insufficient documentation

## 2015-02-22 DIAGNOSIS — E291 Testicular hypofunction: Secondary | ICD-10-CM

## 2015-02-22 DIAGNOSIS — R7989 Other specified abnormal findings of blood chemistry: Secondary | ICD-10-CM

## 2015-02-22 NOTE — Progress Notes (Signed)
Patient presents for testosterone injection States feeling well Last injection received 01/25/15  Patient is aware he is due for 3 month f/u with PCP 03/11/15

## 2015-03-01 ENCOUNTER — Other Ambulatory Visit: Payer: Self-pay | Admitting: Cardiology

## 2015-03-07 ENCOUNTER — Ambulatory Visit: Payer: Medicare HMO | Attending: Internal Medicine | Admitting: Internal Medicine

## 2015-03-07 ENCOUNTER — Encounter: Payer: Self-pay | Admitting: Internal Medicine

## 2015-03-07 DIAGNOSIS — N528 Other male erectile dysfunction: Secondary | ICD-10-CM

## 2015-03-07 DIAGNOSIS — Z87891 Personal history of nicotine dependence: Secondary | ICD-10-CM | POA: Diagnosis not present

## 2015-03-07 DIAGNOSIS — E291 Testicular hypofunction: Secondary | ICD-10-CM | POA: Diagnosis not present

## 2015-03-07 DIAGNOSIS — I10 Essential (primary) hypertension: Secondary | ICD-10-CM | POA: Diagnosis not present

## 2015-03-07 DIAGNOSIS — R7989 Other specified abnormal findings of blood chemistry: Secondary | ICD-10-CM

## 2015-03-07 MED ORDER — TESTOSTERONE CYPIONATE 200 MG/ML IM SOLN: 200.0000 mg | INTRAMUSCULAR | Status: DC

## 2015-03-07 MED ORDER — TESTOSTERONE ENANTHATE 200 MG/ML IM SOLN
200.0000 mg | Freq: Once | INTRAMUSCULAR | Status: AC
  Administered 2015-03-07: 200 mg via INTRAMUSCULAR

## 2015-03-07 MED ORDER — TADALAFIL 20 MG PO TABS: 10.0000 mg | ORAL_TABLET | ORAL | Status: DC | PRN

## 2015-03-07 NOTE — Patient Instructions (Signed)
Testosterone This test is used to determine if your testosterone level is abnormal. This could be used to explain difficulty getting an erection (erectile dysfunction), inability of your partner to get pregnant (infertility), premature or delayed puberty if you are male, or the appearance of masculine physical features if you are male. PREPARATION FOR TEST A blood sample is obtained by inserting a needle into a vein in the arm. NORMAL FINDINGS  Free Testosterone: 0.3-2 pg/mL  % Free Testosterone: 0.1%-0.3% Total Testosterone:  7 mos-9 yrs (Tanner Stage I)  Male: Less than 30 ng/dL  Male: Less than 30 ng/dL  10-13 yrs (Tanner Stage II)  Male: Less than 300 ng/dL  Male: Less than 40 ng/dL  14-15 yrs (Tanner Stage III)  Male: 170-540 ng/dL  Male: Less than 60 ng/dL  16-19 yrs (Tanner Stage IV, V)  Male: 250-910 ng/dL  Male: Less than 70 ng/dL  20 yrs and over  Male: [phone] ng/dL  Male: Less than 70 ng/dL Ranges for normal findings may vary among different laboratories and hospitals. You should always check with your doctor after having lab work or other tests done to discuss the meaning of your test results and whether your values are considered within normal limits. MEANING OF TEST  Your caregiver will go over the test results with you and discuss the importance and meaning of your results, as well as treatment options and the need for additional tests if necessary. OBTAINING THE TEST RESULTS It is your responsibility to obtain your test results. Ask the lab or department performing the test when and how you will get your results. Document Released: 11/19/2004 Document Revised: 01/25/2012 Document Reviewed: 02/28/2014 [facility] Patient Information 2015 [place], [place]. This information is not intended to replace advice given to you by your health care provider. Make sure you discuss any questions you have with your health care provider.

## 2015-03-07 NOTE — Progress Notes (Signed)
Patient here for testosterone injection Patient here for refill on cialis Vitals WNL

## 2015-03-07 NOTE — Progress Notes (Signed)
Patient ID: Steven Hale, male   DOB: 1956-02-26, 59 y.o.   MRN: [id number]   Steven Hale, is a 59 y.o. male  IRC:[id number]  PZW:[id number]  DOB - 05/13/1956  Chief Complaint  Patient presents with  . Follow-up  . Medication Refill  . Injections        Subjective:   Steven Hale is a 59 y.o. male here today for a follow up visit. Patient has significant medical history as listed below including low testosterone and erectile dysfunction. Patient is here today for testosterone injection on refill of Cialis. Vitals are within normal limits. Patient has no new complaints today. Patient has No headache, No chest pain, No abdominal pain - No Nausea, No new weakness tingling or numbness, No Cough - SOB.  Problem  Other Male Erectile Dysfunction    ALLERGIES: No Known Allergies  PAST MEDICAL HISTORY: Past Medical History  Diagnosis Date  . PAD (peripheral artery disease)     a. s/p bilat SFA stents;  b. ABIs 11/2012: R 0.99, L 0.86.  [name] Hypertension   . Hyperlipidemia   . Anemia, unspecified   . GERD (gastroesophageal reflux disease)   . Helicobacter pylori (H. pylori) infection   . Hemorrhoids   . Impotence of organic origin   . Bell's palsy   . CAD (coronary artery disease)     a. s/p multiple PCIs;  b. s/p CABG in 09/2010 (LIMA-LAD, SVG-OM1, SVG-distal RCA/OM2);  c.  Myoview 05/2012: EF 54%, no ischemia, no scar.   . Blood transfusion     during treatment for Ca  . DJD (degenerative joint disease)     low back & all over   . Pancreatic cancer     surgery 2009  . Myocardial infarction     2005 and 2012   . Chronic back pain   . Blood transfusion without reported diagnosis     MEDICATIONS AT HOME: Prior to Admission medications   Medication Sig Start Date End Date Taking? Authorizing Provider  allopurinol (ZYLOPRIM) 100 MG tablet Take 100 mg by mouth daily.  09/14/14  Yes Historical Provider, MD  aspirin EC 81 MG tablet Take 1 tablet (81 mg total) by mouth daily.  11/19/14  Yes Danae Orleans, PA-C  atorvastatin (LIPITOR) 40 MG tablet Take 0.5 tablets (20 mg total) by mouth daily. 12/10/14  Yes Tresa Garter, MD  celecoxib (CELEBREX) 200 MG capsule Take 1 capsule (200 mg total) by mouth 2 (two) times daily. 11/19/14  Yes Danae Orleans, PA-C  clopidogrel (PLAVIX) 75 MG tablet Take 75 mg by mouth daily.   Yes Historical Provider, MD  fish oil-omega-3 fatty acids 1000 MG capsule Take 1 capsule (1 g total) by mouth daily. 11/30/12  Yes Rosana Hoes, MD  gabapentin (NEURONTIN) 100 MG capsule Take 200 mg by mouth 3 (three) times daily.   Yes Historical Provider, MD  lisinopril (PRINIVIL,ZESTRIL) 5 MG tablet TAKE 1/2 TABLET EVERY DAY 03/01/15  Yes Lelon Perla, MD  metoprolol tartrate (LOPRESSOR) 25 MG tablet Take 1 tablet (25 mg total) by mouth 2 (two) times daily. 12/10/14  Yes Tresa Garter, MD  Multiple Vitamin (MULTIVITAMIN) tablet Take 1 tablet by mouth daily. 11/30/12  Yes Rosana Hoes, MD  oxyCODONE (OXY IR/ROXICODONE) 5 MG immediate release tablet Take 1-3 tablets (5-15 mg total) by mouth every 4 (four) hours as needed for severe pain. 11/19/14  Yes Danae Orleans, PA-C  tadalafil (CIALIS) 20 MG tablet Take 0.5-1 tablets (10-20 mg total) by mouth  every other day as needed for erectile dysfunction. 03/07/15  Yes Tresa Garter, MD  testosterone cypionate (DEPOTESTOTERONE CYPIONATE) 200 MG/ML injection Inject 1 mL (200 mg total) into the muscle every 28 (twenty-eight) days. 03/07/15  Yes Tresa Garter, MD  vitamin B-12 (CYANOCOBALAMIN) 100 MCG tablet Take 100 mcg by mouth daily.   Yes Historical Provider, MD     Objective:   There were no vitals filed for this visit.  Exam General appearance : Awake, alert, not in any distress. Speech Clear. Not toxic looking HEENT: Atraumatic and Normocephalic, pupils equally reactive to light and accomodation Neck: supple, no JVD. No cervical lymphadenopathy.  Chest:Good air entry bilaterally, no added  sounds  CVS: S1 S2 regular, no murmurs.  Abdomen: Bowel sounds present, Non tender and not distended with no gaurding, rigidity or rebound. Extremities: B/L Lower Ext shows no edema, both legs are warm to touch Neurology: Awake alert, and oriented X 3, CN II-XII intact, Non focal Skin:No Rash  Data Review Lab Results  Component Value Date   HGBA1C 6.10 12/10/2014   HGBA1C 5.8 08/30/2013   HGBA1C 6.0* 04/17/2013     Assessment & Plan   1. Low testosterone  - testosterone cypionate (DEPOTESTOTERONE CYPIONATE) 200 MG/ML injection; Inject 1 mL (200 mg total) into the muscle every 28 (twenty-eight) days.  Dispense: 10 mL; Refill: 5  2. Other male erectile dysfunction  - tadalafil (CIALIS) 20 MG tablet; Take 0.5-1 tablets (10-20 mg total) by mouth every other day as needed for erectile dysfunction.  Dispense: 30 tablet; Refill: 6  3. Essential hypertension, benign  - We have discussed target BP range and blood pressure goal - I have advised patient to check BP regularly and to call us back or report to clinic if the numbers are consistently higher than 140/90  - We discussed the importance of compliance with medical therapy and DASH diet recommended, consequences of uncontrolled hypertension discussed.  - continue current BP medications  Patient have been counseled extensively about nutrition and exercise  Return in about 3 months (around 06/06/2015), or if symptoms worsen or fail to improve, for Annual Physical, Follow up HTN.  The patient was given clear instructions to go to ER or return to medical center if symptoms don't improve, worsen or new problems develop. The patient verbalized understanding. The patient was told to call to get lab results if they haven't heard anything in the next week.   This note has been created with Surveyor, quantity. Any transcriptional errors are unintentional.    Angelica Chessman, MD, [place], [place], [place],  [place] and [place], [place]   03/07/2015, 5:23 PM

## 2015-03-15 ENCOUNTER — Telehealth: Payer: Self-pay | Admitting: Internal Medicine

## 2015-03-15 DIAGNOSIS — N529 Male erectile dysfunction, unspecified: Secondary | ICD-10-CM

## 2015-03-15 MED ORDER — SILDENAFIL CITRATE 25 MG PO TABS: 25.0000 mg | ORAL_TABLET | Freq: Every day | ORAL | Status: DC | PRN

## 2015-03-15 NOTE — Telephone Encounter (Signed)
Spoke to patient and told him I had called the pharmacy for him and the pharmacist said there are no generics for Cialis.  Patient states he used to be on it and he would bring me the bottle to show me.

## 2015-03-15 NOTE — Telephone Encounter (Signed)
Patient called stating pharmacy needs [place] to clarify medication tadalafil (CIALIS) 20 MG tablet, can be given as generic. Please f/u with patient.Pt uses [facility].

## 2015-03-15 NOTE — Telephone Encounter (Signed)
Patient walked in with bottle of sildafinil prescribed by Dr. Tana Coast.  Spoke to pharmacy who said they do not have what Dr. Doreene Burke had prescribed (generic for Cialis).

## 2015-04-03 ENCOUNTER — Other Ambulatory Visit: Payer: Self-pay | Admitting: *Deleted

## 2015-04-03 ENCOUNTER — Telehealth: Payer: Self-pay | Admitting: *Deleted

## 2015-04-03 ENCOUNTER — Ambulatory Visit: Payer: Self-pay

## 2015-04-03 DIAGNOSIS — R7989 Other specified abnormal findings of blood chemistry: Secondary | ICD-10-CM

## 2015-04-03 MED ORDER — TESTOSTERONE CYPIONATE 200 MG/ML IM SOLN: 200.0000 mg | INTRAMUSCULAR | Status: DC

## 2015-04-03 NOTE — Telephone Encounter (Signed)
Patient was scheduled to receive testosterone injection today, however, did not bring medication. States pharmacy did not receive refill order Refill on testosterone e-scribed to [facility] per patient request Patient rescheduled appt to receive injection (04/10/15)

## 2015-04-04 ENCOUNTER — Ambulatory Visit: Payer: Self-pay

## 2015-04-09 ENCOUNTER — Telehealth: Payer: Self-pay | Admitting: Internal Medicine

## 2015-04-09 NOTE — Telephone Encounter (Signed)
Pt is stating that his testosterone cypionate (DEPOTESTOTERONE CYPIONATE) 200 MG/ML injection is not available at his pharmacy at [facility]. Please follow up with pt. As pt has an appt tomorrow at 3pm.

## 2015-04-10 ENCOUNTER — Ambulatory Visit: Payer: Self-pay

## 2015-04-16 ENCOUNTER — Ambulatory Visit: Payer: Self-pay | Admitting: *Deleted

## 2015-04-24 ENCOUNTER — Ambulatory Visit: Payer: Commercial Managed Care - HMO | Attending: Internal Medicine | Admitting: *Deleted

## 2015-04-24 DIAGNOSIS — E349 Endocrine disorder, unspecified: Secondary | ICD-10-CM

## 2015-04-24 DIAGNOSIS — E291 Testicular hypofunction: Secondary | ICD-10-CM | POA: Insufficient documentation

## 2015-04-24 MED ORDER — TESTOSTERONE CYPIONATE 100 MG/ML IM SOLN
200.0000 mg | Freq: Once | INTRAMUSCULAR | Status: AC
  Administered 2015-04-24: 200 mg via INTRAMUSCULAR

## 2015-04-24 NOTE — Progress Notes (Signed)
Patient presents for Depo testosterone injection States feeling well Last injection received 03/07/15 Next injection due 05/22/2015  Patient requesting PCP contact Clydell Hakim, PhD, MD ([facility]) at [phone] to okay following pain meds: Gabapentin celebrex oxycodone  Per PCP: Pain meds as listed above okay if Dr. Maryjean Ka prescribing If requesting PCP to refill, will refill gabapentin and celebrex but not oxycodone.  Attempted to reach Dr. Maryjean Ka' office by telephone, however, there was no answer and no VM set -up

## 2015-04-29 ENCOUNTER — Other Ambulatory Visit: Payer: Self-pay | Admitting: Internal Medicine

## 2015-05-07 ENCOUNTER — Other Ambulatory Visit: Payer: Self-pay | Admitting: Cardiology

## 2015-05-07 NOTE — Telephone Encounter (Signed)
Rx(s) sent to pharmacy electronically.  

## 2015-05-13 ENCOUNTER — Other Ambulatory Visit: Payer: Self-pay

## 2015-05-22 ENCOUNTER — Ambulatory Visit: Payer: Commercial Managed Care - HMO | Attending: Internal Medicine | Admitting: *Deleted

## 2015-05-22 DIAGNOSIS — E291 Testicular hypofunction: Secondary | ICD-10-CM

## 2015-05-22 DIAGNOSIS — R7989 Other specified abnormal findings of blood chemistry: Secondary | ICD-10-CM

## 2015-05-22 MED ORDER — TESTOSTERONE CYPIONATE 200 MG/ML IM SOLN
200.0000 mg | Freq: Once | INTRAMUSCULAR | Status: AC
  Administered 2015-05-22: 200 mg via INTRAMUSCULAR

## 2015-05-22 NOTE — Progress Notes (Signed)
Patient presents for Depo Testosterone injection States feeling well Last injection received 04/24/15 Next injection due 06/19/2015

## 2015-06-19 ENCOUNTER — Telehealth: Payer: Self-pay

## 2015-06-19 ENCOUNTER — Ambulatory Visit: Payer: Medicare Other | Attending: Internal Medicine

## 2015-06-19 VITALS — BP 111/69 | HR 57 | Temp 98.3°F | Resp 16 | Wt 217.0 lb

## 2015-06-19 DIAGNOSIS — E291 Testicular hypofunction: Secondary | ICD-10-CM | POA: Insufficient documentation

## 2015-06-19 DIAGNOSIS — R7989 Other specified abnormal findings of blood chemistry: Secondary | ICD-10-CM

## 2015-06-19 MED ORDER — TESTOSTERONE CYPIONATE 200 MG/ML IM SOLN
200.0000 mg | Freq: Once | INTRAMUSCULAR | Status: AC
  Administered 2015-06-19: 200 mg via INTRAMUSCULAR

## 2015-06-19 NOTE — Telephone Encounter (Signed)
Attempted to call patient to let him know it is thirty days after third testosterone Injection we will do blood work to check his levels Patient not available Left message on voice mail to return our call

## 2015-06-19 NOTE — Progress Notes (Signed)
Patient here for his third injection of testosterone Injection given to the right buttocks Patient tolerated the injection well Will check with provider to see if patient will require blood work

## 2015-06-20 ENCOUNTER — Other Ambulatory Visit: Payer: Self-pay

## 2015-06-20 DIAGNOSIS — R7989 Other specified abnormal findings of blood chemistry: Secondary | ICD-10-CM

## 2015-06-20 NOTE — Telephone Encounter (Signed)
Patient came into office, scheduled lab visit to check testosterone, orders have not been placed into system. Pt is scheduled 07/26/15

## 2015-07-09 ENCOUNTER — Ambulatory Visit (INDEPENDENT_AMBULATORY_CARE_PROVIDER_SITE_OTHER): Payer: Medicare Other | Admitting: Cardiovascular Disease

## 2015-07-09 ENCOUNTER — Encounter: Payer: Self-pay | Admitting: Cardiovascular Disease

## 2015-07-09 ENCOUNTER — Ambulatory Visit: Payer: Self-pay | Admitting: Cardiovascular Disease

## 2015-07-09 VITALS — BP 130/76 | HR 52 | Ht 69.0 in | Wt 219.4 lb

## 2015-07-09 DIAGNOSIS — I739 Peripheral vascular disease, unspecified: Secondary | ICD-10-CM | POA: Diagnosis not present

## 2015-07-09 DIAGNOSIS — I2510 Atherosclerotic heart disease of native coronary artery without angina pectoris: Secondary | ICD-10-CM

## 2015-07-09 DIAGNOSIS — G629 Polyneuropathy, unspecified: Secondary | ICD-10-CM | POA: Diagnosis not present

## 2015-07-09 MED ORDER — LISINOPRIL 5 MG PO TABS: 2.5000 mg | ORAL_TABLET | Freq: Every day | ORAL | Status: DC

## 2015-07-09 NOTE — Assessment & Plan Note (Signed)
His symptoms seem to be due to peripheral neuropathy. He is ready on gabapentin but he reports that he is not getting as much relief as he used to when he first started the medication. He has a follow-up appointment with his primary care physician and I asked him to address this with him.

## 2015-07-09 NOTE — Assessment & Plan Note (Signed)
He has no calf claudication and his pulses are unchanged. Current symptoms seem to be due to peripheral neuropathy and not claudication. Continue medical therapy. He is due for lower extremity arterial duplex in a month or 2. Follow-up with me in 6 months.

## 2015-07-09 NOTE — Patient Instructions (Addendum)
Medication Instructions:  Your physician recommends that you continue on your current medications as directed. Please refer to the Current Medication list given to you today.  Labwork: No new orders.   Testing/Procedures: Your physician has requested that you have a lower extremity arterial duplex in OCTOBER. This test is an ultrasound of the arteries in the legs. It looks at arterial blood flow in the legs. Allow one hour for Lower Arterial scans. There are no restrictions or special instructions  Follow-Up: Your physician wants you to follow-up in: 6 MONTHS with Dr Fletcher Anon.  You will receive a reminder letter in the mail two months in advance. If you don't receive a letter, please call our office to schedule the follow-up appointment.   Any Other Special Instructions Will Be Listed Below (If Applicable).

## 2015-07-09 NOTE — Assessment & Plan Note (Signed)
He has no symptoms of angina. Continue medical therapy. 

## 2015-07-09 NOTE — Progress Notes (Signed)
HPI: This is a 59 year old male who is here today for a followup visit regarding  peripheral arterial disease. He has known history of coronary artery disease with multiple interventions in the past. Cardiac catheterization in November of 2011 showed severe three-vessel coronary disease and he underwent coronary artery bypass graft surgery. He also had SFA stents placed bilaterally years ago. He quit smoking in 2009.  He was seen in 08/2013 for significant worsening of Claudication and cramping of left calf.  I proceeded with angiography at that time which showed an occluded distal left SFA starting above the previously placed stent. I performed directional atherectomy and balloon angioplasty which established normal flow.Post procedure ABI was normal.   He was seen in 08/2014 for bilateral calf claudication worse on the left after walking just 3 minutes on treadmill. There was evidence of restenosis.  I proceeded with angiography in 08/2014 which showed:  1. No significant aortoiliac disease. No significant obstructive disease affecting the right lower extremity with patent SFA stent.  2. Significant restenosis in the distal left SFA with two-vessel runoff below the knee.  3. Successful drug coated balloon angioplasty to the distal left SFA  He is concerned about bilateral feet numbing and burning sensation which improves with walking. He denies any calf discomfort with walking. No chest pain or shortness of breath.  No Known Allergies   Current Outpatient Prescriptions on File Prior to Visit  Medication Sig Dispense Refill  . allopurinol (ZYLOPRIM) 100 MG tablet Take 100 mg by mouth daily.   0  . aspirin EC 81 MG tablet Take 1 tablet (81 mg total) by mouth daily.    [name] atorvastatin (LIPITOR) 40 MG tablet Take 0.5 tablets (20 mg total) by mouth daily. 90 tablet 3  . clopidogrel (PLAVIX) 75 MG tablet Take 75 mg by mouth daily.    . fish oil-omega-3 fatty acids 1000 MG capsule Take 1 capsule  (1 g total) by mouth daily. 60 capsule 1  . lisinopril (PRINIVIL,ZESTRIL) 5 MG tablet Take 0.5 tablets (2.5 mg total) by mouth daily. <PLEASE MAKE APPOINTMENT WITH DR. CRENSHAW FOR REFILLS> 45 tablet 0  . metoprolol tartrate (LOPRESSOR) 25 MG tablet Take 1 tablet (25 mg total) by mouth 2 (two) times daily. 180 tablet 3  . Multiple Vitamin (MULTIVITAMIN) tablet Take 1 tablet by mouth daily. 30 tablet 10  . tadalafil (CIALIS) 20 MG tablet Take 0.5-1 tablets (10-20 mg total) by mouth every other day as needed for erectile dysfunction. 30 tablet 6  . vitamin B-12 (CYANOCOBALAMIN) 100 MCG tablet Take 100 mcg by mouth daily.    [name] testosterone cypionate (DEPOTESTOTERONE CYPIONATE) 200 MG/ML injection Inject 1 mL (200 mg total) into the muscle every 28 (twenty-eight) days. (Patient not taking: Reported on 07/09/2015) 10 mL 5   No current facility-administered medications on file prior to visit.     Past Medical History  Diagnosis Date  . PAD (peripheral artery disease)     a. s/p bilat SFA stents;  b. ABIs 11/2012: R 0.99, L 0.86.  [name] Hypertension   . Hyperlipidemia   . Anemia, unspecified   . GERD (gastroesophageal reflux disease)   . Helicobacter pylori (H. pylori) infection   . Hemorrhoids   . Impotence of organic origin   . Bell's palsy   . CAD (coronary artery disease)     a. s/p multiple PCIs;  b. s/p CABG in 09/2010 (LIMA-LAD, SVG-OM1, SVG-distal RCA/OM2);  c.  Myoview 05/2012: EF 54%, no ischemia, no  scar.   . Blood transfusion     during treatment for Ca  . DJD (degenerative joint disease)     low back & all over   . Pancreatic cancer     surgery 2009  . Myocardial infarction     2005 and 2012   . Chronic back pain   . Blood transfusion without reported diagnosis      Past Surgical History  Procedure Laterality Date  . Pancreatic  cancer  05/10/2008    Resection of distal panrease and spleen  . Splenectomy    . Back surgery      1992  . Peripheral vascular catheterization   Oct 2014    Lt SFA PTA  . Hemorrhoid surgery  10/30/2011    Procedure: HEMORRHOIDECTOMY;  Surgeon: Harl Bowie, MD;  Location: [facility];  Service: General;  Laterality: N/A;  . Peripheral vascular catheterization  Oct 2015    ISR Lt SFA-PTA  . Abdominal aortagram N/A 09/13/2013    Procedure: ABDOMINAL AORTAGRAM;  Surgeon: Wellington Hampshire, MD;  Location: [facility] CATH LAB;  Service: Cardiovascular;  Laterality: N/A;  . Thrombectomy femoral artery Left 09/13/2013    Procedure: THROMBECTOMY FEMORAL ARTERY;  Surgeon: Wellington Hampshire, MD;  Location: [facility] CATH LAB;  Service: Cardiovascular;  Laterality: Left;  . Abdominal aortagram N/A 08/29/2014    Procedure: ABDOMINAL AORTAGRAM;  Surgeon: Wellington Hampshire, MD;  Location: [facility] CATH LAB;  Service: Cardiovascular;  Laterality: N/A;  . Coronary artery bypass graft  nov 2011    x 4  . Partial knee arthroplasty Right 11/19/2014    Procedure: RIGHT UNI-KNEE ARTHROPLASTY MEDIALLY;  Surgeon: Mauri Pole, MD;  Location: [facility] ORS;  Service: Orthopedics;  Laterality: Right;  . Right partial knee replacement       Family History  Problem Relation Age of Onset  . Heart attack Father     died of MI at age 29  . Anesthesia problems Neg Hx   . Hypotension Neg Hx   . Malignant hyperthermia Neg Hx   . Pseudochol deficiency Neg Hx   . Colon cancer Neg Hx   . Esophageal cancer Neg Hx   . Prostate cancer Neg Hx   . Rectal cancer Neg Hx   . Stomach cancer Neg Hx      Social History   Social History  . Marital Status: Single    Spouse Name: N/A  . Number of Children: 0  . Years of Education: N/A   Occupational History  . Diability    Social History Main Topics  . Smoking status: Former Smoker    Types: Cigarettes    Quit date: 11/29/2008  . Smokeless tobacco: Never Used  . Alcohol Use: 1.2 oz/week    2 Shots of liquor per week     Comment: 2 drinks of brandy every week   . Drug Use: No  . Sexual Activity: Not Currently   Other Topics Concern   . 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 He appears well-developed and well-nourished. No distress.  HENT: No nasal discharge.  Head: Normocephalic and  atraumatic.  Eyes: Pupils are equal and round. Right eye exhibits no discharge. Left eye exhibits no discharge.  Neck: Normal range of motion. Neck supple. No JVD present. No thyromegaly present.  Cardiovascular: Normal rate, regular rhythm, normal heart sounds and. Exam reveals no gallop and no friction rub. No murmur heard.  Pulmonary/Chest: Effort normal and breath sounds normal. No stridor. No respiratory distress. He has no wheezes. He has no rales. He exhibits no tenderness.  Abdominal: Soft. Bowel sounds are normal. He exhibits no distension. There is no tenderness. There is no rebound and no guarding.  Musculoskeletal: Normal range of motion. He exhibits no edema and no tenderness.  Neurological: He is alert and oriented to person, place, and time. Coordination normal.  Skin: Skin is warm and dry. No rash noted. He is not diaphoretic. No erythema. No pallor.  Psychiatric: He has a normal mood and affect. His behavior is normal. Judgment and thought content normal.  Vascular: Femoral pulses are slightly diminished bilaterally. PT: +1 in the right side and +1 on the left side, DP: + 1 On the right side and 0 on the left side.      ASSESSMENT AND PLAN

## 2015-07-26 ENCOUNTER — Ambulatory Visit: Payer: Medicare Other | Attending: Internal Medicine

## 2015-07-26 DIAGNOSIS — E291 Testicular hypofunction: Secondary | ICD-10-CM | POA: Diagnosis not present

## 2015-07-26 DIAGNOSIS — R7989 Other specified abnormal findings of blood chemistry: Secondary | ICD-10-CM

## 2015-07-27 LAB — TESTOSTERONE: TESTOSTERONE: 226 ng/dL — AB (ref 300–890)

## 2015-07-29 ENCOUNTER — Telehealth: Payer: Self-pay | Admitting: Internal Medicine

## 2015-07-29 NOTE — Telephone Encounter (Signed)
Patient called requesting results from lab work

## 2015-08-05 ENCOUNTER — Ambulatory Visit (INDEPENDENT_AMBULATORY_CARE_PROVIDER_SITE_OTHER): Payer: Medicare Other | Admitting: Family Medicine

## 2015-08-05 VITALS — BP 126/80 | HR 66 | Temp 98.3°F | Resp 18 | Ht 69.0 in | Wt 216.0 lb

## 2015-08-05 DIAGNOSIS — S90424A Blister (nonthermal), right lesser toe(s), initial encounter: Secondary | ICD-10-CM

## 2015-08-05 NOTE — Progress Notes (Signed)
[facility] [address], [place] [address] [phone]  Date:  08/05/2015   Name:  Steven Hale   DOB:  November 03, 1956   MRN:  [id number]  PCP:  Angelica Chessman, MD    Chief Complaint: Mass   History of Present Illness:  Steven Hale is a 59 y.o. very pleasant male patient who presents with the following:  Here today as a new patient with complaint of a mass of the right foot between the 4th and 5th toes.  He thought perhaps he hurt it doing her usual water aerobics.  Otherwise he is not aware of any possible injury He OW feels well, no fever The mass is a bit tender, OW he feels fine NKDA  Patient Active Problem List   Diagnosis Date Noted  . Peripheral neuropathy 07/09/2015  . Other male erectile dysfunction 03/07/2015  . Essential hypertension, benign 03/07/2015  . Pure hypercholesterolemia 12/10/2014  . Essential hypertension 12/10/2014  . Low testosterone 12/10/2014  . S/P right UKR 11/19/2014  . Lumbar disc herniation with radiculopathy 10/15/2014  . Back pain 10/15/2014  . Acute back pain   . Chronic back pain 10/12/2014  . H/O pancreatic cancer 2009 10/12/2014  . Iron deficiency anemia 06/11/2014  . Testosterone deficiency 09/07/2013  . Benign paroxysmal positional vertigo 08/30/2013  . PAD (peripheral artery disease) 06/02/2012  . Claudication 05/14/2011  . BLOOD IN STOOL 09/20/2009  . PVD (peripheral vascular disease) 01/08/2009  . BELL'S PALSY, RIGHT 12/25/2008  . Hyperlipidemia 06/05/2008  . CAD- multiple PCIs, CABG 2011, low risk Myoview 05/2012 11/17/2003    Past Medical History  Diagnosis Date  . PAD (peripheral artery disease)     a. s/p bilat SFA stents;  b. ABIs 11/2012: R 0.99, L 0.86.  [name] Hypertension   . Hyperlipidemia   . Anemia, unspecified   . GERD (gastroesophageal reflux disease)   . Helicobacter pylori (H. pylori) infection   . Hemorrhoids   . Impotence of organic origin   . Bell's palsy   . CAD (coronary  artery disease)     a. s/p multiple PCIs;  b. s/p CABG in 09/2010 (LIMA-LAD, SVG-OM1, SVG-distal RCA/OM2);  c.  Myoview 05/2012: EF 54%, no ischemia, no scar.   . Blood transfusion     during treatment for Ca  . DJD (degenerative joint disease)     low back & all over   . Pancreatic cancer     surgery 2009  . Myocardial infarction     2005 and 2012   . Chronic back pain   . Blood transfusion without reported diagnosis     Past Surgical History  Procedure Laterality Date  . Pancreatic  cancer  05/10/2008    Resection of distal panrease and spleen  . Splenectomy    . Back surgery      1992  . Peripheral vascular catheterization  Oct 2014    Lt SFA PTA  . Hemorrhoid surgery  10/30/2011    Procedure: HEMORRHOIDECTOMY;  Surgeon: Harl Bowie, MD;  Location: [place];  Service: General;  Laterality: N/A;  . Peripheral vascular catheterization  Oct 2015    ISR Lt SFA-PTA  . Abdominal aortagram N/A 09/13/2013    Procedure: ABDOMINAL AORTAGRAM;  Surgeon: Wellington Hampshire, MD;  Location: [facility] CATH LAB;  Service: Cardiovascular;  Laterality: N/A;  . Thrombectomy femoral artery Left 09/13/2013    Procedure: THROMBECTOMY FEMORAL ARTERY;  Surgeon: Wellington Hampshire, MD;  Location: [facility] CATH  LAB;  Service: Cardiovascular;  Laterality: Left;  . Abdominal aortagram N/A 08/29/2014    Procedure: ABDOMINAL AORTAGRAM;  Surgeon: Wellington Hampshire, MD;  Location: [facility] CATH LAB;  Service: Cardiovascular;  Laterality: N/A;  . Coronary artery bypass graft  nov 2011    x 4  . Partial knee arthroplasty Right 11/19/2014    Procedure: RIGHT UNI-KNEE ARTHROPLASTY MEDIALLY;  Surgeon: Mauri Pole, MD;  Location: [facility];  Service: Orthopedics;  Laterality: Right;  . Right partial knee replacement      Social History  Substance Use Topics  . Smoking status: Former Smoker    Types: Cigarettes    Quit date: 11/29/2008  . Smokeless tobacco: Never Used  . Alcohol Use: 1.2 oz/week    2 Shots of liquor per week      Comment: 2 drinks of brandy every week     Family History  Problem Relation Age of Onset  . Heart attack Father     died of MI at age 42  . Anesthesia problems Neg Hx   . Hypotension Neg Hx   . Malignant hyperthermia Neg Hx   . Pseudochol deficiency Neg Hx   . Colon cancer Neg Hx   . Esophageal cancer Neg Hx   . Prostate cancer Neg Hx   . Rectal cancer Neg Hx   . Stomach cancer Neg Hx     No Known Allergies  Medication list has been reviewed and updated.  Current Outpatient Prescriptions on File Prior to Visit  Medication Sig Dispense Refill  . allopurinol (ZYLOPRIM) 100 MG tablet Take 100 mg by mouth daily.   0  . aspirin EC 81 MG tablet Take 1 tablet (81 mg total) by mouth daily.    [name] atorvastatin (LIPITOR) 40 MG tablet Take 0.5 tablets (20 mg total) by mouth daily. 90 tablet 3  . clopidogrel (PLAVIX) 75 MG tablet Take 75 mg by mouth daily.    . fish oil-omega-3 fatty acids 1000 MG capsule Take 1 capsule (1 g total) by mouth daily. 60 capsule 1  . gabapentin (NEURONTIN) 300 MG capsule Take 300 mg by mouth 3 (three) times daily.  2  . lisinopril (PRINIVIL,ZESTRIL) 5 MG tablet Take 0.5 tablets (2.5 mg total) by mouth daily. 45 tablet 3  . metoprolol tartrate (LOPRESSOR) 25 MG tablet Take 1 tablet (25 mg total) by mouth 2 (two) times daily. 180 tablet 3  . Multiple Vitamin (MULTIVITAMIN) tablet Take 1 tablet by mouth daily. 30 tablet 10  . tadalafil (CIALIS) 20 MG tablet Take 0.5-1 tablets (10-20 mg total) by mouth every other day as needed for erectile dysfunction. 30 tablet 6  . testosterone cypionate (DEPOTESTOTERONE CYPIONATE) 200 MG/ML injection Inject 1 mL (200 mg total) into the muscle every 28 (twenty-eight) days. 10 mL 5  . vitamin B-12 (CYANOCOBALAMIN) 100 MCG tablet Take 100 mcg by mouth daily.     No current facility-administered medications on file prior to visit.    Review of Systems:  As per HPI- otherwise negative.   Physical Examination: Filed Vitals:    08/05/15 1051  BP: 126/80  Pulse: 66  Temp: 98.3 F (36.8 C)  Resp: 18   Filed Vitals:   08/05/15 1051  Height: '5\' 9"'$  (1.753 m)  Weight: 216 lb (97.977 kg)   Body mass index is 31.88 kg/(m^2). Ideal Body Weight: Weight in (lb) to have BMI = 25: 168.9   GEN: WDWN, NAD, Non-toxic, Alert & Oriented x 3 HEENT: Atraumatic, Normocephalic.  Ears and Nose: No external deformity. EXTR: No clubbing/cyanosis/edema NEURO: Normal gait.  PSYCH: Normally interactive. Conversant. Not depressed or anxious appearing.  Calm demeanor.  There is a blister on the dorsum of the right foot between the 4th and 5th toes  VC obtained.  Prepped area with betadine.   I and D with 11 blade, expressed clear fluid, placed a ban-aid  Assessment and Plan: Blister of toe, right, initial encounter  Drained blister, no complications Went over wound care He will follow-up if needed   Signed Lamar Blinks, MD

## 2015-08-05 NOTE — Patient Instructions (Signed)
You had a blister on your foot that we drained.  Keep the area clean and do not swim for 2-3 days Let me know if it is coming back or if you have any other concerns

## 2015-08-08 ENCOUNTER — Ambulatory Visit (INDEPENDENT_AMBULATORY_CARE_PROVIDER_SITE_OTHER): Payer: Medicare Other | Admitting: Family Medicine

## 2015-08-08 VITALS — BP 120/80 | HR 51 | Temp 98.4°F | Resp 16 | Ht 69.0 in | Wt 217.0 lb

## 2015-08-08 DIAGNOSIS — L03039 Cellulitis of unspecified toe: Secondary | ICD-10-CM

## 2015-08-08 MED ORDER — CEPHALEXIN 500 MG PO CAPS: 500.0000 mg | ORAL_CAPSULE | Freq: Two times a day (BID) | ORAL | Status: DC

## 2015-08-08 NOTE — Patient Instructions (Signed)
We drained your blister again today- I hope that it will not come back!   Take the keflex twice a day for one week to prevent any infection Let us know if any concerns

## 2015-08-08 NOTE — Progress Notes (Signed)
[facility] [address], [place] [address] [phone]  Date:  08/08/2015   Name:  Jaideep Pollack   DOB:  10/07/1956   MRN:  [id number]  PCP:  Angelica Chessman, MD    Chief Complaint: Follow-up   History of Present Illness:  Davonne Jarnigan is a 59 y.o. very pleasant male patient who presents with the following:  He was here 3 days ago and I incised a blister on his toe. The fluid re-accumulated the same day, and it now seems to be hot.  It hurts "like a toothache."  At night it will ache.  He has pain if he puts pressure on the foot some of the time He has not noted any fever and otherwise has felt well He is not aware of any injury to the foot  Patient Active Problem List   Diagnosis Date Noted  . Peripheral neuropathy 07/09/2015  . Other male erectile dysfunction 03/07/2015  . Essential hypertension, benign 03/07/2015  . Pure hypercholesterolemia 12/10/2014  . Essential hypertension 12/10/2014  . Low testosterone 12/10/2014  . S/P right UKR 11/19/2014  . Lumbar disc herniation with radiculopathy 10/15/2014  . Back pain 10/15/2014  . Acute back pain   . Chronic back pain 10/12/2014  . H/O pancreatic cancer 2009 10/12/2014  . Iron deficiency anemia 06/11/2014  . Testosterone deficiency 09/07/2013  . Benign paroxysmal positional vertigo 08/30/2013  . PAD (peripheral artery disease) 06/02/2012  . Claudication 05/14/2011  . BLOOD IN STOOL 09/20/2009  . PVD (peripheral vascular disease) 01/08/2009  . BELL'S PALSY, RIGHT 12/25/2008  . Hyperlipidemia 06/05/2008  . CAD- multiple PCIs, CABG 2011, low risk Myoview 05/2012 11/17/2003    Past Medical History  Diagnosis Date  . PAD (peripheral artery disease)     a. s/p bilat SFA stents;  b. ABIs 11/2012: R 0.99, L 0.86.  [name] Hypertension   . Hyperlipidemia   . Anemia, unspecified   . GERD (gastroesophageal reflux disease)   . Helicobacter pylori (H. pylori) infection   . Hemorrhoids   . Impotence of  organic origin   . Bell's palsy   . CAD (coronary artery disease)     a. s/p multiple PCIs;  b. s/p CABG in 09/2010 (LIMA-LAD, SVG-OM1, SVG-distal RCA/OM2);  c.  Myoview 05/2012: EF 54%, no ischemia, no scar.   . Blood transfusion     during treatment for Ca  . DJD (degenerative joint disease)     low back & all over   . Pancreatic cancer     surgery 2009  . Myocardial infarction     2005 and 2012   . Chronic back pain   . Blood transfusion without reported diagnosis     Past Surgical History  Procedure Laterality Date  . Pancreatic  cancer  05/10/2008    Resection of distal panrease and spleen  . Splenectomy    . Back surgery      1992  . Peripheral vascular catheterization  Oct 2014    Lt SFA PTA  . Hemorrhoid surgery  10/30/2011    Procedure: HEMORRHOIDECTOMY;  Surgeon: Harl Bowie, MD;  Location: [place];  Service: General;  Laterality: N/A;  . Peripheral vascular catheterization  Oct 2015    ISR Lt SFA-PTA  . Abdominal aortagram N/A 09/13/2013    Procedure: ABDOMINAL AORTAGRAM;  Surgeon: Wellington Hampshire, MD;  Location: [facility] CATH LAB;  Service: Cardiovascular;  Laterality: N/A;  . Thrombectomy femoral artery Left 09/13/2013  Procedure: THROMBECTOMY FEMORAL ARTERY;  Surgeon: Wellington Hampshire, MD;  Location: [place] CATH LAB;  Service: Cardiovascular;  Laterality: Left;  . Abdominal aortagram N/A 08/29/2014    Procedure: ABDOMINAL AORTAGRAM;  Surgeon: Wellington Hampshire, MD;  Location: [facility] CATH LAB;  Service: Cardiovascular;  Laterality: N/A;  . Coronary artery bypass graft  nov 2011    x 4  . Partial knee arthroplasty Right 11/19/2014    Procedure: RIGHT UNI-KNEE ARTHROPLASTY MEDIALLY;  Surgeon: Mauri Pole, MD;  Location: [facility];  Service: Orthopedics;  Laterality: Right;  . Right partial knee replacement      Social History  Substance Use Topics  . Smoking status: Former Smoker    Types: Cigarettes    Quit date: 11/29/2008  . Smokeless tobacco: Never Used  . Alcohol  Use: 1.2 oz/week    2 Shots of liquor per week     Comment: 2 drinks of brandy every week     Family History  Problem Relation Age of Onset  . Heart attack Father     died of MI at age 73  . Anesthesia problems Neg Hx   . Hypotension Neg Hx   . Malignant hyperthermia Neg Hx   . Pseudochol deficiency Neg Hx   . Colon cancer Neg Hx   . Esophageal cancer Neg Hx   . Prostate cancer Neg Hx   . Rectal cancer Neg Hx   . Stomach cancer Neg Hx     No Known Allergies  Medication list has been reviewed and updated.  Current Outpatient Prescriptions on File Prior to Visit  Medication Sig Dispense Refill  . allopurinol (ZYLOPRIM) 100 MG tablet Take 100 mg by mouth daily.   0  . aspirin EC 81 MG tablet Take 1 tablet (81 mg total) by mouth daily.    [name] atorvastatin (LIPITOR) 40 MG tablet Take 0.5 tablets (20 mg total) by mouth daily. 90 tablet 3  . clopidogrel (PLAVIX) 75 MG tablet Take 75 mg by mouth daily.    . fish oil-omega-3 fatty acids 1000 MG capsule Take 1 capsule (1 g total) by mouth daily. 60 capsule 1  . gabapentin (NEURONTIN) 300 MG capsule Take 300 mg by mouth 3 (three) times daily.  2  . lisinopril (PRINIVIL,ZESTRIL) 5 MG tablet Take 0.5 tablets (2.5 mg total) by mouth daily. 45 tablet 3  . metoprolol tartrate (LOPRESSOR) 25 MG tablet Take 1 tablet (25 mg total) by mouth 2 (two) times daily. 180 tablet 3  . Multiple Vitamin (MULTIVITAMIN) tablet Take 1 tablet by mouth daily. 30 tablet 10  . tadalafil (CIALIS) 20 MG tablet Take 0.5-1 tablets (10-20 mg total) by mouth every other day as needed for erectile dysfunction. 30 tablet 6  . testosterone cypionate (DEPOTESTOTERONE CYPIONATE) 200 MG/ML injection Inject 1 mL (200 mg total) into the muscle every 28 (twenty-eight) days. 10 mL 5  . vitamin B-12 (CYANOCOBALAMIN) 100 MCG tablet Take 100 mcg by mouth daily.     No current facility-administered medications on file prior to visit.    Review of Systems:  As per HPI- otherwise  negative.   Physical Examination: Filed Vitals:   08/08/15 1356  BP: 120/80  Pulse: 51  Temp: 98.4 F (36.9 C)  Resp: 16   Filed Vitals:   08/08/15 1356  Height: '5\' 9"'$  (1.753 m)  Weight: 217 lb (98.431 kg)   Body mass index is 32.03 kg/(m^2). Ideal Body Weight: Weight in (lb) to have BMI = 25: 168.9  GEN: WDWN, NAD, Non-toxic, Alert & Oriented x 3 HEENT: Atraumatic, Normocephalic.  Ears and Nose: No external deformity. EXTR: No clubbing/cyanosis/edema NEURO: Normal gait for pt- he limps some chronically due to peripheral neuropathy PSYCH: Normally interactive. Conversant. Not depressed or anxious appearing.  Calm demeanor.  Right foot: the blister between the 4th and 5th toes has returned. Prepped with alcohol and then incised with an 11 blade. No pus, just clear fluid.  There is perhaps a tiny amount of redness around the blister but I do not think he has significant cellulitis    Assessment and Plan: Cellulitis of toe, unspecified laterality - Plan: cephALEXin (KEFLEX) 500 MG capsule  He is concerned about infection and will treat with keflex  He will let us know if any other concerns  Signed Lamar Blinks, MD

## 2015-08-13 ENCOUNTER — Ambulatory Visit (INDEPENDENT_AMBULATORY_CARE_PROVIDER_SITE_OTHER): Payer: Medicare Other | Admitting: Internal Medicine

## 2015-08-13 ENCOUNTER — Ambulatory Visit (INDEPENDENT_AMBULATORY_CARE_PROVIDER_SITE_OTHER): Payer: Medicare Other

## 2015-08-13 VITALS — BP 130/80 | HR 53 | Temp 98.6°F | Resp 16 | Ht 70.0 in | Wt 214.0 lb

## 2015-08-13 DIAGNOSIS — S90521D Blister (nonthermal), right ankle, subsequent encounter: Secondary | ICD-10-CM

## 2015-08-13 DIAGNOSIS — L03031 Cellulitis of right toe: Secondary | ICD-10-CM

## 2015-08-13 DIAGNOSIS — L02611 Cutaneous abscess of right foot: Secondary | ICD-10-CM

## 2015-08-13 DIAGNOSIS — R7302 Impaired glucose tolerance (oral): Secondary | ICD-10-CM | POA: Diagnosis not present

## 2015-08-13 DIAGNOSIS — L089 Local infection of the skin and subcutaneous tissue, unspecified: Secondary | ICD-10-CM | POA: Diagnosis not present

## 2015-08-13 DIAGNOSIS — Z23 Encounter for immunization: Secondary | ICD-10-CM

## 2015-08-13 LAB — POCT CBC
GRANULOCYTE PERCENT: 36.2 % — AB (ref 37–80)
HEMATOCRIT: 41.7 % — AB (ref 43.5–53.7)
Hemoglobin: 13.1 g/dL — AB (ref 14.1–18.1)
Lymph, poc: 2.5 (ref 0.6–3.4)
MCH, POC: 29 pg (ref 27–31.2)
MCHC: 31.5 g/dL — AB (ref 31.8–35.4)
MCV: 92.1 fL (ref 80–97)
MID (cbc): 0.2 (ref 0–0.9)
MPV: 9 fL (ref 0–99.8)
POC GRANULOCYTE: 1.6 — AB (ref 2–6.9)
POC LYMPH %: 59.1 % — AB (ref 10–50)
POC MID %: 4.7 % (ref 0–12)
Platelet Count, POC: 261 10*3/uL (ref 142–424)
RBC: 4.52 M/uL — AB (ref 4.69–6.13)
RDW, POC: 15.1 %
WBC: 4.3 10*3/uL — AB (ref 4.6–10.2)

## 2015-08-13 LAB — COMPREHENSIVE METABOLIC PANEL
ALBUMIN: 5 g/dL (ref 3.6–5.1)
ALK PHOS: 66 U/L (ref 40–115)
ALT: 22 U/L (ref 9–46)
AST: 28 U/L (ref 10–35)
BILIRUBIN TOTAL: 0.8 mg/dL (ref 0.2–1.2)
BUN: 11 mg/dL (ref 7–25)
CALCIUM: 10.5 mg/dL — AB (ref 8.6–10.3)
CO2: 23 mmol/L (ref 20–31)
CREATININE: 0.88 mg/dL (ref 0.70–1.33)
Chloride: 103 mmol/L (ref 98–110)
Glucose, Bld: 115 mg/dL — ABNORMAL HIGH (ref 65–99)
Potassium: 4.3 mmol/L (ref 3.5–5.3)
Sodium: 136 mmol/L (ref 135–146)
TOTAL PROTEIN: 8 g/dL (ref 6.1–8.1)

## 2015-08-13 LAB — POCT SEDIMENTATION RATE: POCT SED RATE: 7 mm/h (ref 0–22)

## 2015-08-13 LAB — POCT GLYCOSYLATED HEMOGLOBIN (HGB A1C): Hemoglobin A1C: 6.2

## 2015-08-13 LAB — GLUCOSE, POCT (MANUAL RESULT ENTRY): POC Glucose: 124 mg/dl — AB (ref 70–99)

## 2015-08-13 MED ORDER — MUPIROCIN 2 % EX OINT: 1.0000 "application " | TOPICAL_OINTMENT | Freq: Three times a day (TID) | CUTANEOUS | Status: DC

## 2015-08-13 MED ORDER — DOXYCYCLINE HYCLATE 100 MG PO TABS: 100.0000 mg | ORAL_TABLET | Freq: Two times a day (BID) | ORAL | Status: DC

## 2015-08-13 NOTE — Progress Notes (Signed)
Patient ID: Steven Hale, male   DOB: 02/18/1956, 59 y.o.   MRN: [id number]   08/13/2015 at 9:50 AM  Steven Hale / DOB: September 11, 1956 / MRN: [id number]  Problem list reviewed and updated by me where necessary.   SUBJECTIVE  Steven Hale is a 59 y.o. well appearing male presenting for the chief complaint of recheck blisters on plantar right foot. He is a little better. Cause of blisters is unknown, no assoc fever or erythema. Feet both feel. Hot. No hx DM.    He  has a past medical history of PAD (peripheral artery disease); Hypertension; Hyperlipidemia; Anemia, unspecified; GERD (gastroesophageal reflux disease); Helicobacter pylori (H. pylori) infection; Hemorrhoids; Impotence of organic origin; Bell's palsy; CAD (coronary artery disease); Blood transfusion; DJD (degenerative joint disease); Pancreatic cancer; Myocardial infarction; Chronic back pain; and Blood transfusion without reported diagnosis.    Medications reviewed and updated by myself where necessary, and exist elsewhere in the encounter.   Steven Hale has No Known Allergies. He  reports that he quit smoking about 6 years ago. His smoking use included Cigarettes. He has never used smokeless tobacco. He reports that he drinks about 1.2 oz of alcohol per week. He reports that he does not use illicit drugs. He  reports that he does not currently engage in sexual activity. The patient  has past surgical history that includes pancreatic  cancer (05/10/2008); splenectomy; Back surgery; Cardiac catheterization (Oct 2014); Hemorrhoid surgery (10/30/2011); Cardiac catheterization (Oct 2015); abdominal aortagram (N/A, 09/13/2013); Thrombectomy femoral artery (Left, 09/13/2013); abdominal aortagram (N/A, 08/29/2014); Coronary artery bypass graft (nov 2011); Partial knee arthroplasty (Right, 11/19/2014); and right partial knee replacement.  His family history includes Heart attack in his father. There is no history of Anesthesia problems, Hypotension,  Malignant hyperthermia, Pseudochol deficiency, Colon cancer, Esophageal cancer, Prostate cancer, Rectal cancer, or Stomach cancer.  Review of Systems  Constitutional: Negative for fever.  Respiratory: Negative for shortness of breath.   Cardiovascular: Negative for chest pain.  Gastrointestinal: Negative for nausea.  Skin: Negative for rash.  Neurological: Negative for dizziness and headaches.    OBJECTIVE  His  height is '5\' 10"'$  (1.778 m) and weight is 214 lb (97.07 kg). His oral temperature is 98.6 F (37 C). His blood pressure is 130/80 and his pulse is 53. His respiration is 16 and oxygen saturation is 97%.  The patient's body mass index is 30.71 kg/(m^2).  Physical Exam  Constitutional: He is oriented to person, place, and time. He appears well-developed and well-nourished. No distress.  HENT:  Head: Normocephalic.  Nose: Nose normal.  Eyes: Conjunctivae and EOM are normal.  Cardiovascular: Regular rhythm and normal heart sounds.   Pulses:      Dorsalis pedis pulses are 2+ on the right side.       Posterior tibial pulses are 2+ on the right side.  Respiratory: Effort normal.  Musculoskeletal: He exhibits edema and tenderness.       Right foot: There is tenderness and swelling. There is normal range of motion, no bony tenderness, normal capillary refill, no crepitus, no deformity and no laceration.       Feet:  Blisters are on plantar surface, not dorsal  Blister that was drained is moist not angry, no purulence seen.  Neurological: He is alert and oriented to person, place, and time. He exhibits normal muscle tone. Gait abnormal. Coordination normal.  Psychiatric: He has a normal mood and affect.    Results for orders placed or performed in visit on 08/13/15 (  from the past 24 hour(s))  POCT CBC     Status: Abnormal   Collection Time: 08/13/15  9:40 AM  Result Value Ref Range   WBC 4.3 (A) 4.6 - 10.2 K/uL   Lymph, poc 2.5 0.6 - 3.4   POC LYMPH PERCENT 59.1 (A) 10 - 50  %L   MID (cbc) 0.2 0 - 0.9   POC MID % 4.7 0 - 12 %M   POC Granulocyte 1.6 (A) 2 - 6.9   Granulocyte percent 36.2 (A) 37 - 80 %G   RBC 4.52 (A) 4.69 - 6.13 M/uL   Hemoglobin 13.1 (A) 14.1 - 18.1 g/dL   HCT, POC 41.7 (A) 43.5 - 53.7 %   MCV 92.1 80 - 97 fL   MCH, POC 29.0 27 - 31.2 pg   MCHC 31.5 (A) 31.8 - 35.4 g/dL   RDW, POC 15.1 %   Platelet Count, POC 261 142 - 424 K/uL   MPV 9.0 0 - 99.8 fL  POCT glucose (manual entry)     Status: Abnormal   Collection Time: 08/13/15  9:40 AM  Result Value Ref Range   POC Glucose 124 (A) 70 - 99 mg/dl  POCT glycosylated hemoglobin (Hb A1C)     Status: None   Collection Time: 08/13/15  9:43 AM  Result Value Ref Range   Hemoglobin A1C 6.2   [facility] reading (PRIMARY) by  Dr.Tyrese Capriotti dorsal arterial calcification, lucency distal toes suggestive of blister disease.    ASSESSMENT & PLAN  Steven Hale was seen today for foot pain and flu vaccine.  Diagnoses and all orders for this visit:  Glucose intolerance (impaired glucose tolerance) -     POCT CBC -     POCT SEDIMENTATION RATE -     POCT glucose (manual entry) -     POCT glycosylated hemoglobin (Hb A1C) -     Comprehensive metabolic panel -     DG Foot Complete Right; Future -     doxycycline (VIBRA-TABS) 100 MG tablet; Take 1 tablet (100 mg total) by mouth 2 (two) times daily. -     mupirocin ointment (BACTROBAN) 2 %; Apply 1 application topically 3 (three) times daily. -     Wound culture  Cellulitis and abscess of toe, right -     POCT CBC -     POCT SEDIMENTATION RATE -     POCT glucose (manual entry) -     POCT glycosylated hemoglobin (Hb A1C) -     Comprehensive metabolic panel -     DG Foot Complete Right; Future -     doxycycline (VIBRA-TABS) 100 MG tablet; Take 1 tablet (100 mg total) by mouth 2 (two) times daily. -     mupirocin ointment (BACTROBAN) 2 %; Apply 1 application topically 3 (three) times daily. -     Wound culture  Blister of ankle with infection, right, subsequent  encounter -     POCT CBC -     POCT SEDIMENTATION RATE -     POCT glucose (manual entry) -     POCT glycosylated hemoglobin (Hb A1C) -     Comprehensive metabolic panel -     DG Foot Complete Right; Future -     doxycycline (VIBRA-TABS) 100 MG tablet; Take 1 tablet (100 mg total) by mouth 2 (two) times daily. -     mupirocin ointment (BACTROBAN) 2 %; Apply 1 application topically 3 (three) times daily. -     Wound culture

## 2015-08-13 NOTE — Patient Instructions (Addendum)
Blisters Blisters are fluid-filled sacs that form within the skin. Common causes of blistering are friction, burns, and exposure to irritating chemicals. The fluid in the blister protects the underlying damaged skin. Most of the time it is not recommended that you open blisters. When a blister is opened, there is an increased chance for infection. Usually, a blister will open on its own. They then dry up and peel off within 10 days. If the blister is tense and uncomfortable (painful) the fluid may be drained. If it is drained the roof of the blister should be left intact. The draining should only be done by a medical professional under aseptic conditions. Poorly fitting shoes and boots can cause blisters by being too tight or too loose. Wearing extra socks or using tape, bandages, or pads over the blister-prone area helps prevent the problem by reducing friction. Blisters heal more slowly if you have diabetes or if you have problems with your circulation. You need to be careful about medical follow-up to prevent infection. HOME CARE INSTRUCTIONS  Protect areas where blisters have formed until the skin is healed. Use a special bandage with a hole cut in the middle around the blister. This reduces pressure and friction. When the blister breaks, trim off the loose skin and keep the area clean by washing it with soap daily. Soaking the blister or broken-open blister with diluted vinegar twice daily for 15 minutes will dry it up and speed the healing. Use 3 tablespoons of white vinegar per quart of water (45 mL white vinegar per liter of water). An antibiotic ointment and a bandage can be used to cover the area after soaking.  SEEK MEDICAL CARE IF:   You develop increased redness, pain, swelling, or drainage in the blistered area.  You develop a pus-like discharge from the blistered area, chills, or a fever. MAKE SURE YOU:   Understand these instructions.  Will watch your condition.  Will get help right  away if you are not doing well or get worse. Document Released: 12/10/2004 Document Revised: 01/25/2012 Document Reviewed: 11/07/2008 [facility] Patient Information 2015 [place], [place]. This information is not intended to replace advice given to you by your health care provider. Make sure you discuss any questions you have with your health care provider. Cellulitis Cellulitis is an infection of the skin and the tissue beneath it. The infected area is usually red and tender. Cellulitis occurs most often in the arms and lower legs.  CAUSES  Cellulitis is caused by bacteria that enter the skin through cracks or cuts in the skin. The most common types of bacteria that cause cellulitis are staphylococci and streptococci. SIGNS AND SYMPTOMS   Redness and warmth.  Swelling.  Tenderness or pain.  Fever. DIAGNOSIS  Your health care provider can usually determine what is wrong based on a physical exam. Blood tests may also be done. TREATMENT  Treatment usually involves taking an antibiotic medicine. HOME CARE INSTRUCTIONS   Take your antibiotic medicine as directed by your health care provider. Finish the antibiotic even if you start to feel better.  Keep the infected arm or leg elevated to reduce swelling.  Apply a warm cloth to the affected area up to 4 times per day to relieve pain.  Take medicines only as directed by your health care provider.  Keep all follow-up visits as directed by your health care provider. SEEK MEDICAL CARE IF:   You notice red streaks coming from the infected area.  Your red area gets larger  or turns dark in color.  Your bone or joint underneath the infected area becomes painful after the skin has healed.  Your infection returns in the same area or another area.  You notice a swollen bump in the infected area.  You develop new symptoms.  You have a fever. SEEK IMMEDIATE MEDICAL CARE IF:   You feel very sleepy.  You develop vomiting or diarrhea.  You  have a general ill feeling (malaise) with muscle aches and pains. MAKE SURE YOU:   Understand these instructions.  Will watch your condition.  Will get help right away if you are not doing well or get worse. Document Released: 08/12/2005 Document Revised: 03/19/2014 Document Reviewed: 01/18/2012 [facility] Patient Information 2015 [place], [place]. This information is not intended to replace advice given to you by your health care provider. Make sure you discuss any questions you have with your health care provider.

## 2015-08-16 ENCOUNTER — Ambulatory Visit (INDEPENDENT_AMBULATORY_CARE_PROVIDER_SITE_OTHER): Payer: Medicare Other | Admitting: Family Medicine

## 2015-08-16 VITALS — BP 110/70 | HR 59 | Temp 98.0°F | Resp 16 | Ht 70.0 in | Wt 220.2 lb

## 2015-08-16 DIAGNOSIS — R7303 Prediabetes: Secondary | ICD-10-CM | POA: Insufficient documentation

## 2015-08-16 DIAGNOSIS — R7309 Other abnormal glucose: Secondary | ICD-10-CM

## 2015-08-16 DIAGNOSIS — S90821D Blister (nonthermal), right foot, subsequent encounter: Secondary | ICD-10-CM

## 2015-08-16 HISTORY — DX: Prediabetes: R73.03

## 2015-08-16 LAB — WOUND CULTURE
Gram Stain: NONE SEEN
Gram Stain: NONE SEEN
Gram Stain: NONE SEEN

## 2015-08-16 MED ORDER — METFORMIN HCL 500 MG PO TABS: 500.0000 mg | ORAL_TABLET | Freq: Two times a day (BID) | ORAL | Status: DC

## 2015-08-16 NOTE — Patient Instructions (Signed)
I am sorry that your blister is giving you so much trouble!  Continue taking the doxycycline antibiotic as directed.   We are also going to start you on metformin- this is a medication to help lower your blood sugar and prevent onset of diabetes Take it once a day in the evening for one week, then increase to twice a day It also looks like you are getting some athlete's foot between your toes- use an OTC cream for athlete's foot as directed Please come and see [place] if not improving over the next few days- then see [place] to recheck your sugars in about 2 months

## 2015-08-16 NOTE — Progress Notes (Signed)
[facility] [address], [place] [address] [phone]  Date:  08/16/2015   Name:  Steven Hale   DOB:  [birth year]   MRN:  [id number]  PCP:  Angelica Chessman, MD    Chief Complaint: Foot Problem   History of Present Illness:  Steven Hale is a 59 y.o. very pleasant male patient who presents with the following:  He was here on 9/19 and on 9/22 with concern about a blister on the dorsum of his foot- this was opened with a sterile blade and we started abx (keflex) on the 2nd visit.  He then came back on 9/27- he was noted to have blisters on more of the right foot toes, he was started on doxycycline.  Labs from that visit as below- he has pre-diabetes,  Prelim wound culture shows GBS, staph aureus.    He is overall feeling well, no fever. He would like to start on metformin to see if it might help with his feet and to prevent onset of DM.  This is a good idea given his heart and PVD history- ->  Of note he has a history of PVD, CAD, Cabg in 2011, SFA stents years ago. He had treatment of occluded distal left SFA in 2014- this was treated with a stent with good results.  Repeat angiography of his legs in 08/2014 showed no significant obstructive disease in the RLE  He is on chronic plavix, lipitor, neurontin for nerve pain, lisinopril, metoprolol  Normal recent renal and liver function, chronic mild normocytic anemia with colonoscopy   He is not aware of any new activity that could be triggering these blisters  Results for orders placed or performed in visit on 08/13/15  Wound culture  Result Value Ref Range   Gram Stain No WBC Seen    Gram Stain No Squamous Epithelial Cells Seen    Gram Stain No Organisms Seen    Preliminary Report Moderate GROUP B STREP (S.AGALACTIAE) ISOLATED    Preliminary Report Moderate STAPHYLOCOCCUS AUREUS   Comprehensive metabolic panel  Result Value Ref Range   Sodium 136 135 - 146 mmol/L   Potassium 4.3 3.5 - 5.3 mmol/L   Chloride 103 98 - 110 mmol/L   CO2 23 20 - 31 mmol/L   Glucose, Bld 115 (H) 65 - 99 mg/dL   BUN 11 7 - 25 mg/dL   Creat 0.88 0.70 - 1.33 mg/dL   Total Bilirubin 0.8 0.2 - 1.2 mg/dL   Alkaline Phosphatase 66 40 - 115 U/L   AST 28 10 - 35 U/L   ALT 22 9 - 46 U/L   Total Protein 8.0 6.1 - 8.1 g/dL   Albumin 5.0 3.6 - 5.1 g/dL   Calcium 10.5 (H) 8.6 - 10.3 mg/dL  POCT CBC  Result Value Ref Range   WBC 4.3 (A) 4.6 - 10.2 K/uL   Lymph, poc 2.5 0.6 - 3.4   POC LYMPH PERCENT 59.1 (A) 10 - 50 %L   MID (cbc) 0.2 0 - 0.9   POC MID % 4.7 0 - 12 %M   POC Granulocyte 1.6 (A) 2 - 6.9   Granulocyte percent 36.2 (A) 37 - 80 %G   RBC 4.52 (A) 4.69 - 6.13 M/uL   Hemoglobin 13.1 (A) 14.1 - 18.1 g/dL   HCT, POC 41.7 (A) 43.5 - 53.7 %   MCV 92.1 80 - 97 fL   MCH, POC 29.0 27 - 31.2 pg   MCHC 31.5 (  A) 31.8 - 35.4 g/dL   RDW, POC 15.1 %   Platelet Count, POC 261 142 - 424 K/uL   MPV 9.0 0 - 99.8 fL  POCT SEDIMENTATION RATE  Result Value Ref Range   POCT SED RATE 7 0 - 22 mm/hr  POCT glucose (manual entry)  Result Value Ref Range   POC Glucose 124 (A) 70 - 99 mg/dl  POCT glycosylated hemoglobin (Hb A1C)  Result Value Ref Range   Hemoglobin A1C 6.2      Patient Active Problem List   Diagnosis Date Noted  . Peripheral neuropathy 07/09/2015  . Other male erectile dysfunction 03/07/2015  . Essential hypertension, benign 03/07/2015  . Pure hypercholesterolemia 12/10/2014  . Essential hypertension 12/10/2014  . Low testosterone 12/10/2014  . S/P right UKR 11/19/2014  . Lumbar disc herniation with radiculopathy 10/15/2014  . Back pain 10/15/2014  . Acute back pain   . Chronic back pain 10/12/2014  . H/O pancreatic cancer 2009 10/12/2014  . Iron deficiency anemia 06/11/2014  . Testosterone deficiency 09/07/2013  . Benign paroxysmal positional vertigo 08/30/2013  . PAD (peripheral artery disease) 06/02/2012  . Claudication 05/14/2011  . BLOOD IN STOOL 09/20/2009  . PVD (peripheral  vascular disease) 01/08/2009  . BELL'S PALSY, RIGHT 12/25/2008  . Hyperlipidemia 06/05/2008  . CAD- multiple PCIs, CABG 2011, low risk Myoview 05/2012 11/17/2003    Past Medical History  Diagnosis Date  . PAD (peripheral artery disease)     a. s/p bilat SFA stents;  b. ABIs 11/2012: R 0.99, L 0.86.  [name] Hypertension   . Hyperlipidemia   . Anemia, unspecified   . GERD (gastroesophageal reflux disease)   . Helicobacter pylori (H. pylori) infection   . Hemorrhoids   . Impotence of organic origin   . Bell's palsy   . CAD (coronary artery disease)     a. s/p multiple PCIs;  b. s/p CABG in 09/2010 (LIMA-LAD, SVG-OM1, SVG-distal RCA/OM2);  c.  Myoview 05/2012: EF 54%, no ischemia, no scar.   . Blood transfusion     during treatment for Ca  . DJD (degenerative joint disease)     low back & all over   . Pancreatic cancer     surgery 2009  . Myocardial infarction     2005 and 2012   . Chronic back pain   . Blood transfusion without reported diagnosis     Past Surgical History  Procedure Laterality Date  . Pancreatic  cancer  05/10/2008    Resection of distal panrease and spleen  . Splenectomy    . Back surgery      1992  . Peripheral vascular catheterization  Oct 2014    Lt SFA PTA  . Hemorrhoid surgery  10/30/2011    Procedure: HEMORRHOIDECTOMY;  Surgeon: Harl Bowie, MD;  Location: [facility];  Service: General;  Laterality: N/A;  . Peripheral vascular catheterization  Oct 2015    ISR Lt SFA-PTA  . Abdominal aortagram N/A 09/13/2013    Procedure: ABDOMINAL AORTAGRAM;  Surgeon: Wellington Hampshire, MD;  Location: [facility] CATH LAB;  Service: Cardiovascular;  Laterality: N/A;  . Thrombectomy femoral artery Left 09/13/2013    Procedure: THROMBECTOMY FEMORAL ARTERY;  Surgeon: Wellington Hampshire, MD;  Location: [facility] CATH LAB;  Service: Cardiovascular;  Laterality: Left;  . Abdominal aortagram N/A 08/29/2014    Procedure: ABDOMINAL AORTAGRAM;  Surgeon: Wellington Hampshire, MD;  Location: [facility] CATH LAB;   Service: Cardiovascular;  Laterality: N/A;  . Coronary artery  bypass graft  nov 2011    x 4  . Partial knee arthroplasty Right 11/19/2014    Procedure: RIGHT UNI-KNEE ARTHROPLASTY MEDIALLY;  Surgeon: Mauri Pole, MD;  Location: [facility] ORS;  Service: Orthopedics;  Laterality: Right;  . Right partial knee replacement      Social History  Substance Use Topics  . Smoking status: Former Smoker    Types: Cigarettes    Quit date: 11/29/2008  . Smokeless tobacco: Never Used  . Alcohol Use: 1.2 oz/week    2 Shots of liquor per week     Comment: 2 drinks of brandy every week     Family History  Problem Relation Age of Onset  . Heart attack Father     died of MI at age 46  . Anesthesia problems Neg Hx   . Hypotension Neg Hx   . Malignant hyperthermia Neg Hx   . Pseudochol deficiency Neg Hx   . Colon cancer Neg Hx   . Esophageal cancer Neg Hx   . Prostate cancer Neg Hx   . Rectal cancer Neg Hx   . Stomach cancer Neg Hx     No Known Allergies  Medication list has been reviewed and updated.  Current Outpatient Prescriptions on File Prior to Visit  Medication Sig Dispense Refill  . aspirin EC 81 MG tablet Take 1 tablet (81 mg total) by mouth daily.    [name] atorvastatin (LIPITOR) 40 MG tablet Take 0.5 tablets (20 mg total) by mouth daily. 90 tablet 3  . clopidogrel (PLAVIX) 75 MG tablet Take 75 mg by mouth daily.    [name] doxycycline (VIBRA-TABS) 100 MG tablet Take 1 tablet (100 mg total) by mouth 2 (two) times daily. 20 tablet 0  . fish oil-omega-3 fatty acids 1000 MG capsule Take 1 capsule (1 g total) by mouth daily. 60 capsule 1  . gabapentin (NEURONTIN) 300 MG capsule Take 300 mg by mouth 3 (three) times daily.  2  . lisinopril (PRINIVIL,ZESTRIL) 5 MG tablet Take 0.5 tablets (2.5 mg total) by mouth daily. 45 tablet 3  . metoprolol tartrate (LOPRESSOR) 25 MG tablet Take 1 tablet (25 mg total) by mouth 2 (two) times daily. 180 tablet 3  . Multiple Vitamin (MULTIVITAMIN) tablet Take 1 tablet  by mouth daily. 30 tablet 10  . mupirocin ointment (BACTROBAN) 2 % Apply 1 application topically 3 (three) times daily. 30 g 0  . tadalafil (CIALIS) 20 MG tablet Take 0.5-1 tablets (10-20 mg total) by mouth every other day as needed for erectile dysfunction. 30 tablet 6  . allopurinol (ZYLOPRIM) 100 MG tablet Take 100 mg by mouth daily.   0  . cephALEXin (KEFLEX) 500 MG capsule Take 1 capsule (500 mg total) by mouth 2 (two) times daily. (Patient not taking: Reported on 08/16/2015) 14 capsule 0  . testosterone cypionate (DEPOTESTOTERONE CYPIONATE) 200 MG/ML injection Inject 1 mL (200 mg total) into the muscle every 28 (twenty-eight) days. (Patient not taking: Reported on 08/16/2015) 10 mL 5  . vitamin B-12 (CYANOCOBALAMIN) 100 MCG tablet Take 100 mcg by mouth daily.     No current facility-administered medications on file prior to visit.    Review of Systems:  As per HPI- otherwise negative.   Physical Examination: Filed Vitals:   08/16/15 0845  BP: 110/70  Pulse: 59  Temp: 98 F (36.7 C)  Resp: 16   Filed Vitals:   08/16/15 0845  Height: '5\' 10"'$  (1.778 m)  Weight: 220 lb 3.2 oz (99.882 kg)  Body mass index is 31.6 kg/(m^2). Ideal Body Weight: Weight in (lb) to have BMI = 25: 173.9  GEN: WDWN, NAD, Non-toxic, A & O x 3, looks well, overweight HEENT: Atraumatic, Normocephalic. Neck supple. No masses, No LAD. Ears and Nose: No external deformity. CV: RRR, No M/G/R. No JVD. No thrill. No extra heart sounds. PULM: CTA B, no wheezes, crackles, rhonchi. No retractions. No resp. distress. No accessory muscle use. EXTR: No c/c/e NEURO favoring right and wearing a velcro shoe on the right PSYCH: Normally interactive. Conversant. Not depressed or anxious appearing.  Calm demeanor.  Right foot: the plantar surface displays a large blister at the base of the 3rd and 4th toes.  It is tender, does not appear infected.  There is some moist area between the 3/4 and 4/5 toes with odor consistent  with tinea.    VC obtained, prepped area with betadine swab.  Used 11 blade to incise dead skin over blister and drained fluid, dressed wound   Assessment and Plan: Blister of foot, right, subsequent encounter  Pre-diabetes - Plan: metFORMIN (GLUCOPHAGE) 500 MG tablet  Drained another large blister on foot, continue doxycycline. Wound culture not final yet Start on metformin for pre-diabetes in this pt with severe CAD/ vascular disease history.  He will follow-up if not better in a few days- ow plan recheck for glucose in 2 months   Signed Lamar Blinks, MD

## 2015-08-26 ENCOUNTER — Encounter (HOSPITAL_COMMUNITY): Payer: Self-pay

## 2015-08-27 ENCOUNTER — Telehealth: Payer: Self-pay | Admitting: Internal Medicine

## 2015-08-27 NOTE — Telephone Encounter (Signed)
Baxter Flattery from [facility] called requesting to speak with PCP regarding a response for prior authorization that was faxed to them. Please f/u   Baxter [phone]

## 2015-08-30 ENCOUNTER — Ambulatory Visit: Payer: Self-pay | Admitting: Cardiology

## 2015-08-30 ENCOUNTER — Encounter (HOSPITAL_COMMUNITY): Payer: Self-pay

## 2015-09-03 ENCOUNTER — Telehealth: Payer: Self-pay

## 2015-09-03 ENCOUNTER — Other Ambulatory Visit: Payer: Self-pay | Admitting: Cardiovascular Disease

## 2015-09-03 DIAGNOSIS — E7800 Pure hypercholesterolemia, unspecified: Secondary | ICD-10-CM

## 2015-09-03 DIAGNOSIS — I2510 Atherosclerotic heart disease of native coronary artery without angina pectoris: Secondary | ICD-10-CM

## 2015-09-03 DIAGNOSIS — I10 Essential (primary) hypertension: Secondary | ICD-10-CM

## 2015-09-03 DIAGNOSIS — I739 Peripheral vascular disease, unspecified: Secondary | ICD-10-CM

## 2015-09-03 MED ORDER — LISINOPRIL 5 MG PO TABS: 2.5000 mg | ORAL_TABLET | Freq: Every day | ORAL | Status: DC

## 2015-09-03 MED ORDER — METOPROLOL TARTRATE 25 MG PO TABS: 25.0000 mg | ORAL_TABLET | Freq: Two times a day (BID) | ORAL | Status: DC

## 2015-09-03 MED ORDER — ATORVASTATIN CALCIUM 40 MG PO TABS: 20.0000 mg | ORAL_TABLET | Freq: Every day | ORAL | Status: DC

## 2015-09-03 NOTE — Telephone Encounter (Signed)
Dr. Lorelei Pont, please advise

## 2015-09-03 NOTE — Telephone Encounter (Signed)
Called him back- he continues to get these unusual blisters.  Has another in the same spot on his foot. He actually has an appt to see me tomorrow.  However advised him that as the blister is hurting him ok to go ahead and burst it with a sterilized needle at home today

## 2015-09-03 NOTE — Telephone Encounter (Signed)
Pt has been seeing dr copland for athletes foot issues and she has been popping the blisters on his feet and bandaging them but the blister has come back and is very painful and patient wants to know if he and do this him self or does he have to come in

## 2015-09-04 ENCOUNTER — Ambulatory Visit (INDEPENDENT_AMBULATORY_CARE_PROVIDER_SITE_OTHER): Payer: Medicare Other | Admitting: Family Medicine

## 2015-09-04 ENCOUNTER — Encounter: Payer: Self-pay | Admitting: Family Medicine

## 2015-09-04 VITALS — BP 120/88 | HR 99 | Temp 98.6°F | Resp 16 | Ht 70.0 in | Wt 214.8 lb

## 2015-09-04 DIAGNOSIS — B353 Tinea pedis: Secondary | ICD-10-CM | POA: Diagnosis not present

## 2015-09-04 MED ORDER — TERBINAFINE HCL 250 MG PO TABS: 250.0000 mg | ORAL_TABLET | Freq: Every day | ORAL | Status: DC

## 2015-09-04 NOTE — Progress Notes (Signed)
[facility] [address] [place] [address] [phone]  Date:  09/04/2015   Name:  Steven Hale   DOB:  1956/09/12   MRN:  [id number]  PCP:  Lamar Blinks, MD    Chief Complaint: estab care and blister between 4th toe on the right   History of Present Illness:  Steven Hale is a 59 y.o. very pleasant male patient who presents with the following:  He has been in a few times over the last month with bilsters on his right foot. Blisters are always on his right foot.  We are not sure why he keeps getting these.  He has not changed his routine or footwear (although has been wearing a post-op on the right to allow healing)  He thinks these originally come from exercise in the pool.  He did well for a couple of weeks since we drained a blister on 9/30. However over the last few days a blister came back.  He popped it at home yesterday but it has re- accumulated   He last ate about 4.5 hours ago He is using lamisil cream but none by mouth for fungal infection between the toes   Lab Results  Component Value Date   HGBA1C 6.2 08/13/2015    Patient Active Problem List   Diagnosis Date Noted  . Pre-diabetes 08/16/2015  . Peripheral neuropathy ([place]) 07/09/2015  . Other male erectile dysfunction 03/07/2015  . Essential hypertension, benign 03/07/2015  . Pure hypercholesterolemia 12/10/2014  . Essential hypertension 12/10/2014  . Low testosterone 12/10/2014  . S/P right UKR 11/19/2014  . Lumbar disc herniation with radiculopathy 10/15/2014  . Back pain 10/15/2014  . Acute back pain   . Chronic back pain 10/12/2014  . H/O pancreatic cancer 2009 10/12/2014  . Iron deficiency anemia 06/11/2014  . Testosterone deficiency 09/07/2013  . Benign paroxysmal positional vertigo 08/30/2013  . PAD (peripheral artery disease) ([place]) 06/02/2012  . Claudication ([place]) 05/14/2011  . BLOOD IN STOOL 09/20/2009  . PVD (peripheral vascular disease) ([place]) 01/08/2009  . BELL'S  PALSY, RIGHT 12/25/2008  . Hyperlipidemia 06/05/2008  . CAD- multiple PCIs, CABG 2011, low risk Myoview 05/2012 11/17/2003    Past Medical History  Diagnosis Date  . PAD (peripheral artery disease) ([place])     a. s/p bilat SFA stents;  b. ABIs 11/2012: R 0.99, L 0.86.  [name] Hypertension   . Hyperlipidemia   . Anemia, unspecified   . GERD (gastroesophageal reflux disease)   . Helicobacter pylori (H. pylori) infection   . Hemorrhoids   . Impotence of organic origin   . Bell's palsy   . CAD (coronary artery disease)     a. s/p multiple PCIs;  b. s/p CABG in 09/2010 (LIMA-LAD, SVG-OM1, SVG-distal RCA/OM2);  c.  Myoview 05/2012: EF 54%, no ischemia, no scar.   . Blood transfusion     during treatment for Ca  . DJD (degenerative joint disease)     low back & all over   . Pancreatic cancer [facility])     surgery 2009  . Myocardial infarction ([place])     2005 and 2012   . Chronic back pain   . Blood transfusion without reported diagnosis   . Pre-diabetes 08/16/2015    Past Surgical History  Procedure Laterality Date  . Pancreatic  cancer  05/10/2008    Resection of distal panrease and spleen  . Splenectomy    . Back surgery      1992  .  Peripheral vascular catheterization  Oct 2014    Lt SFA PTA  . Hemorrhoid surgery  10/30/2011    Procedure: HEMORRHOIDECTOMY;  Surgeon: Harl Bowie, MD;  Location: [place];  Service: General;  Laterality: N/A;  . Peripheral vascular catheterization  Oct 2015    ISR Lt SFA-PTA  . Abdominal aortagram N/A 09/13/2013    Procedure: ABDOMINAL AORTAGRAM;  Surgeon: Wellington Hampshire, MD;  Location[facility] CATH LAB;  Service: Cardiovascular;  Laterality: N/A;  . Thrombectomy femoral artery Left 09/13/2013    Procedure: THROMBECTOMY FEMORAL ARTERY;  Surgeon: Wellington Hampshire, MD;  Location: [place] CATH LAB;  Service: Cardiovascular;  Laterality: Left;  . Abdominal aortagram N/A 08/29/2014    Procedure: ABDOMINAL AORTAGRAM;  Surgeon: Wellington Hampshire, MD;  Location: [facility] CATH  LAB;  Service: Cardiovascular;  Laterality: N/A;  . Coronary artery bypass graft  nov 2011    x 4  . Partial knee arthroplasty Right 11/19/2014    Procedure: RIGHT UNI-KNEE ARTHROPLASTY MEDIALLY;  Surgeon: Mauri Pole, MD;  Location: [facility] ORS;  Service: Orthopedics;  Laterality: Right;  . Right partial knee replacement      Social History  Substance Use Topics  . Smoking status: Former Smoker    Types: Cigarettes    Quit date: 11/29/2008  . Smokeless tobacco: Never Used  . Alcohol Use: 1.8 oz/week    3 Shots of liquor per week     Comment: 2 drinks of brandy every week     Family History  Problem Relation Age of Onset  . Heart attack Father     died of MI at age 65  . Cancer Father   . Anesthesia problems Neg Hx   . Hypotension Neg Hx   . Malignant hyperthermia Neg Hx   . Pseudochol deficiency Neg Hx   . Colon cancer Neg Hx   . Esophageal cancer Neg Hx   . Prostate cancer Neg Hx   . Rectal cancer Neg Hx   . Stomach cancer Neg Hx     No Known Allergies  Medication list has been reviewed and updated.  Current Outpatient Prescriptions on File Prior to Visit  Medication Sig Dispense Refill  . aspirin EC 81 MG tablet Take 1 tablet (81 mg total) by mouth daily.    [name] atorvastatin (LIPITOR) 40 MG tablet Take 0.5 tablets (20 mg total) by mouth daily. 90 tablet 2  . clopidogrel (PLAVIX) 75 MG tablet Take 75 mg by mouth daily.    . fish oil-omega-3 fatty acids 1000 MG capsule Take 1 capsule (1 g total) by mouth daily. 60 capsule 1  . gabapentin (NEURONTIN) 300 MG capsule Take 300 mg by mouth 3 (three) times daily.  2  . lisinopril (PRINIVIL,ZESTRIL) 5 MG tablet Take 0.5 tablets (2.5 mg total) by mouth daily. 45 tablet 3  . metFORMIN (GLUCOPHAGE) 500 MG tablet Take 1 tablet (500 mg total) by mouth 2 (two) times daily with a meal. 180 tablet 3  . metoprolol tartrate (LOPRESSOR) 25 MG tablet Take 1 tablet (25 mg total) by mouth 2 (two) times daily. 180 tablet 2  . Multiple Vitamin  (MULTIVITAMIN) tablet Take 1 tablet by mouth daily. 30 tablet 10  . allopurinol (ZYLOPRIM) 100 MG tablet Take 100 mg by mouth daily.   0  . cephALEXin (KEFLEX) 500 MG capsule Take 1 capsule (500 mg total) by mouth 2 (two) times daily. (Patient not taking: Reported on 08/16/2015) 14 capsule 0  . doxycycline (VIBRA-TABS) 100 MG  tablet Take 1 tablet (100 mg total) by mouth 2 (two) times daily. (Patient not taking: Reported on 09/04/2015) 20 tablet 0  . mupirocin ointment (BACTROBAN) 2 % Apply 1 application topically 3 (three) times daily. (Patient not taking: Reported on 09/04/2015) 30 g 0  . tadalafil (CIALIS) 20 MG tablet Take 0.5-1 tablets (10-20 mg total) by mouth every other day as needed for erectile dysfunction. (Patient not taking: Reported on 09/04/2015) 30 tablet 6  . testosterone cypionate (DEPOTESTOTERONE CYPIONATE) 200 MG/ML injection Inject 1 mL (200 mg total) into the muscle every 28 (twenty-eight) days. (Patient not taking: Reported on 08/16/2015) 10 mL 5  . vitamin B-12 (CYANOCOBALAMIN) 100 MCG tablet Take 100 mcg by mouth daily.     No current facility-administered medications on file prior to visit.    Review of Systems:  As per HPI- otherwise negative.   Physical Examination: Filed Vitals:   09/04/15 1556  BP: 120/88  Pulse: 99  Temp: 98.6 F (37 C)  Resp: 16   Filed Vitals:   09/04/15 1556  Height: '5\' 10"'$  (1.778 m)  Weight: 214 lb 12.8 oz (97.433 kg)   Body mass index is 30.82 kg/(m^2). Ideal Body Weight: Weight in (lb) to have BMI = 25: 173.9  GEN: WDWN, NAD, Non-toxic, A & O x 3, looks well HEENT: Atraumatic, Normocephalic. Neck supple. No masses, No LAD. Ears and Nose: No external deformity. CV: RRR, No M/G/R. No JVD. No thrill. No extra heart sounds. PULM: CTA B, no wheezes, crackles, rhonchi. No retractions. No resp. distress. No accessory muscle use. EXTR: No c/c/e NEURO Normal gait.  PSYCH: Normally interactive. Conversant. Not depressed or anxious  appearing.  Calm demeanor.  Blister on dorsum of right foot between the 4th and 5th toes.  VC obtained, prepped with betadine and incised with 11 blade, drained clear fluid.  Also trimmed thick dead skin off sole of foot from original blister He has moist, white material between toes c/w tinea pedis  Assessment and Plan: Tinea pedis of right foot - Plan: terbinafine (LAMISIL) 250 MG tablet  Will use 2 weeks of lamisil for likely contributing tinea pedis.  These blisters are unusual - only on the right foot, never had in the past, no other blisters on his body.   Encouraged him to keep the foot very dry for abnout 2 weeks and use the lamisil We hope this will stop blister occurrence Follow-up in 3 months to follow-up on his blood sugar, etc Lab Results  Component Value Date   HGBA1C 6.2 08/13/2015     Signed Lamar Blinks, MD

## 2015-09-04 NOTE — Patient Instructions (Signed)
You are doing a good job taking care of your foot.  At this time focus on keeping it dry Take the lamisil once a day for 2 weeks.  Avoid the pool for a week or two- when you do swim, be sure to get your feet very dry afterwords Let me know if you have any further problems and let's recheck in 3 months

## 2015-09-09 DIAGNOSIS — H40023 Open angle with borderline findings, high risk, bilateral: Secondary | ICD-10-CM | POA: Diagnosis not present

## 2015-11-22 DIAGNOSIS — M4726 Other spondylosis with radiculopathy, lumbar region: Secondary | ICD-10-CM | POA: Diagnosis not present

## 2015-11-22 DIAGNOSIS — M5416 Radiculopathy, lumbar region: Secondary | ICD-10-CM | POA: Diagnosis not present

## 2015-11-22 DIAGNOSIS — M5136 Other intervertebral disc degeneration, lumbar region: Secondary | ICD-10-CM | POA: Diagnosis not present

## 2015-11-22 DIAGNOSIS — M5126 Other intervertebral disc displacement, lumbar region: Secondary | ICD-10-CM | POA: Diagnosis not present

## 2015-11-22 DIAGNOSIS — M545 Low back pain: Secondary | ICD-10-CM | POA: Diagnosis not present

## 2015-11-26 MED FILL — traMADol HCL 50 MG TABS: 50 | 4 days supply | Qty: 24 | Fill #0

## 2015-11-26 MED FILL — AMOXICILLIN 500 MG CAPSULE: 500 | 7 days supply | Qty: 28 | Fill #0

## 2015-11-29 DIAGNOSIS — M5136 Other intervertebral disc degeneration, lumbar region: Secondary | ICD-10-CM | POA: Diagnosis not present

## 2015-12-03 MED FILL — diazePAM 5 MG TABS: 5 | 1 days supply | Qty: 2 | Fill #0

## 2015-12-04 ENCOUNTER — Encounter: Payer: Self-pay | Admitting: Family Medicine

## 2015-12-04 ENCOUNTER — Ambulatory Visit (INDEPENDENT_AMBULATORY_CARE_PROVIDER_SITE_OTHER): Payer: Medicare Other

## 2015-12-04 ENCOUNTER — Ambulatory Visit (INDEPENDENT_AMBULATORY_CARE_PROVIDER_SITE_OTHER): Payer: Medicare Other | Admitting: Family Medicine

## 2015-12-04 VITALS — BP 120/76 | HR 59 | Temp 98.2°F | Resp 16 | Ht 68.0 in | Wt 208.2 lb

## 2015-12-04 DIAGNOSIS — Z119 Encounter for screening for infectious and parasitic diseases, unspecified: Secondary | ICD-10-CM | POA: Diagnosis not present

## 2015-12-04 DIAGNOSIS — M4726 Other spondylosis with radiculopathy, lumbar region: Secondary | ICD-10-CM | POA: Diagnosis not present

## 2015-12-04 DIAGNOSIS — R6889 Other general symptoms and signs: Secondary | ICD-10-CM

## 2015-12-04 DIAGNOSIS — R634 Abnormal weight loss: Secondary | ICD-10-CM

## 2015-12-04 DIAGNOSIS — I25709 Atherosclerosis of coronary artery bypass graft(s), unspecified, with unspecified angina pectoris: Secondary | ICD-10-CM | POA: Diagnosis not present

## 2015-12-04 DIAGNOSIS — M4806 Spinal stenosis, lumbar region: Secondary | ICD-10-CM | POA: Diagnosis not present

## 2015-12-04 DIAGNOSIS — M5126 Other intervertebral disc displacement, lumbar region: Secondary | ICD-10-CM | POA: Diagnosis not present

## 2015-12-04 DIAGNOSIS — R7303 Prediabetes: Secondary | ICD-10-CM | POA: Diagnosis not present

## 2015-12-04 LAB — POCT CBC
GRANULOCYTE PERCENT: 28 % — AB (ref 37–80)
HEMATOCRIT: 38.1 % — AB (ref 43.5–53.7)
Hemoglobin: 13.1 g/dL — AB (ref 14.1–18.1)
Lymph, poc: 2.7 (ref 0.6–3.4)
MCH: 30.8 pg (ref 27–31.2)
MCHC: 34.5 g/dL (ref 31.8–35.4)
MCV: 89.2 fL (ref 80–97)
MID (CBC): 0.3 (ref 0–0.9)
MPV: 7.7 fL (ref 0–99.8)
POC GRANULOCYTE: 1.1 — AB (ref 2–6.9)
POC LYMPH %: 65.9 % — AB (ref 10–50)
POC MID %: 6.1 %M (ref 0–12)
Platelet Count, POC: 300 10*3/uL (ref 142–424)
RBC: 4.27 M/uL — AB (ref 4.69–6.13)
RDW, POC: 13.9 %
WBC: 4.1 10*3/uL — AB (ref 4.6–10.2)

## 2015-12-04 LAB — COMPREHENSIVE METABOLIC PANEL WITH GFR
ALT: 32 U/L (ref 9–46)
AST: 31 U/L (ref 10–35)
Albumin: 5.1 g/dL (ref 3.6–5.1)
Alkaline Phosphatase: 72 U/L (ref 40–115)
BUN: 9 mg/dL (ref 7–25)
CO2: 26 mmol/L (ref 20–31)
Calcium: 10.5 mg/dL — ABNORMAL HIGH (ref 8.6–10.3)
Chloride: 100 mmol/L (ref 98–110)
Creat: 1 mg/dL (ref 0.70–1.33)
Glucose, Bld: 126 mg/dL — ABNORMAL HIGH (ref 65–99)
Potassium: 4.4 mmol/L (ref 3.5–5.3)
Sodium: 135 mmol/L (ref 135–146)
Total Bilirubin: 0.7 mg/dL (ref 0.2–1.2)
Total Protein: 7.9 g/dL (ref 6.1–8.1)

## 2015-12-04 LAB — LIPID PANEL
CHOLESTEROL: 167 mg/dL (ref 125–200)
HDL: 48 mg/dL (ref >=40)
LDL CALC: 83 mg/dL (ref <130)
Total CHOL/HDL Ratio: 3.5 Ratio (ref <=5.0)
Triglycerides: 181 mg/dL — ABNORMAL HIGH (ref <150)
VLDL: 36 mg/dL — AB (ref <30)

## 2015-12-04 LAB — GLUCOSE, POCT (MANUAL RESULT ENTRY): POC GLUCOSE: 131 mg/dL — AB (ref 70–99)

## 2015-12-04 LAB — TSH: TSH: 1.549 u[IU]/mL (ref 0.350–4.500)

## 2015-12-04 LAB — POCT GLYCOSYLATED HEMOGLOBIN (HGB A1C): HEMOGLOBIN A1C: 6.4

## 2015-12-04 LAB — HEPATITIS C ANTIBODY: HCV Ab: NEGATIVE

## 2015-12-04 LAB — HIV ANTIBODY (ROUTINE TESTING W REFLEX): HIV 1&2 Ab, 4th Generation: NONREACTIVE

## 2015-12-04 NOTE — Patient Instructions (Signed)
Start back on your metformin for your diabetes- your blood sugar is well controlled. Your A1c looks fine  I will get in touch with Dr. Fletcher Anon so his office will reschedule with you  If you have any chest pain or worsening of your symptoms please let me know Let's plan to recheck in 6 months or so  I will be moving to a new office- [facility] in [facility] as of February Address: [address] [place], [place] [address] Phone: [phone] I am glad to continue to see you there, or you are welcome to see one of the other providers here at [facility]

## 2015-12-04 NOTE — Progress Notes (Addendum)
[facility] [address] [place] [address] [phone]  Date:  12/04/2015   Name:  Steven Hale   DOB:  November 25, 1955   MRN:  [id number]  PCP:  Lamar Blinks, MD    Chief Complaint: Follow-up and blood sugar   History of Present Illness:  Steven Hale is a 60 y.o. very pleasant male patient who presents with the following:  Here today to follow-up on glucose.  We saw him a few times last year with blisters on his right foot.  He was noted by Dr. Elder Cyphers to have mild glucose intolerance. He finally stopped getting these blisters.  He has been back to exercising. He enjoys exercising in the pool.     He notes that he has felt kind of tired over the last couple of weeks. This can occur at rest or with activity He is having some back trouble and is having an MRI today; he is seeing Dr. Rita Ohara.    He had just a banana so far today.   He has been eating less and trying to diet.   He feels like he has less energy when he exercises.   Not sure if this is due to not eating enough   Wt Readings from Last 3 Encounters:  12/04/15 208 lb 3.2 oz (94.439 kg)  09/04/15 214 lb 12.8 oz (97.433 kg)  08/16/15 220 lb 3.2 oz (99.882 kg)   He has lost some weight He has not had any CP.  However he does feel that he has had some SOB and fatigue with activity. He has noted this for the last couple of months.  He does seem to be able to "walk it off."  He does have a significant cardiac history including MI x2 and CABG in 2011.  Admits that he missed a follow-up appt with his cardiologist Dr. Fletcher Anon and received a letter reminding him to follow-up recently  He just ran out of his metformin this week.   He was taking 500 mg BID and tolerating this ok. Glucose controlled as below- he has pre-diabetes   Lab Results  Component Value Date   HGBA1C 6.2 08/13/2015     Patient Active Problem List   Diagnosis Date Noted  . Pre-diabetes 08/16/2015  . Peripheral neuropathy ([place])  07/09/2015  . Other male erectile dysfunction 03/07/2015  . Essential hypertension, benign 03/07/2015  . Pure hypercholesterolemia 12/10/2014  . Essential hypertension 12/10/2014  . Low testosterone 12/10/2014  . S/P right UKR 11/19/2014  . Lumbar disc herniation with radiculopathy 10/15/2014  . Back pain 10/15/2014  . Acute back pain   . Chronic back pain 10/12/2014  . H/O pancreatic cancer 2009 10/12/2014  . Iron deficiency anemia 06/11/2014  . Testosterone deficiency 09/07/2013  . Benign paroxysmal positional vertigo 08/30/2013  . PAD (peripheral artery disease) ([place]) 06/02/2012  . Claudication ([place]) 05/14/2011  . BLOOD IN STOOL 09/20/2009  . PVD (peripheral vascular disease) ([place]) 01/08/2009  . BELL'S PALSY, RIGHT 12/25/2008  . Hyperlipidemia 06/05/2008  . CAD- multiple PCIs, CABG 2011, low risk Myoview 05/2012 11/17/2003    Past Medical History  Diagnosis Date  . PAD (peripheral artery disease) ([place])     a. s/p bilat SFA stents;  b. ABIs 11/2012: R 0.99, L 0.86.  [name] Hypertension   . Hyperlipidemia   . Anemia, unspecified   . GERD (gastroesophageal reflux disease)   . Helicobacter pylori (H. pylori) infection   . Hemorrhoids   . Impotence of organic  origin   . Bell's palsy   . CAD (coronary artery disease)     a. s/p multiple PCIs;  b. s/p CABG in 09/2010 (LIMA-LAD, SVG-OM1, SVG-distal RCA/OM2);  c.  Myoview 05/2012: EF 54%, no ischemia, no scar.   . Blood transfusion     during treatment for Ca  . DJD (degenerative joint disease)     low back & all over   . Pancreatic cancer [facility])     surgery 2009  . Myocardial infarction ([place])     2005 and 2012   . Chronic back pain   . Blood transfusion without reported diagnosis   . Pre-diabetes 08/16/2015    Past Surgical History  Procedure Laterality Date  . Pancreatic  cancer  05/10/2008    Resection of distal panrease and spleen  . Splenectomy    . Back surgery      1992  . Peripheral vascular catheterization  Oct 2014     Lt SFA PTA  . Hemorrhoid surgery  10/30/2011    Procedure: HEMORRHOIDECTOMY;  Surgeon: Harl Bowie, MD;  Location: [place];  Service: General;  Laterality: N/A;  . Peripheral vascular catheterization  Oct 2015    ISR Lt SFA-PTA  . Abdominal aortagram N/A 09/13/2013    Procedure: ABDOMINAL AORTAGRAM;  Surgeon: Wellington Hampshire, MD;  Location: [facility] CATH LAB;  Service: Cardiovascular;  Laterality: N/A;  . Thrombectomy femoral artery Left 09/13/2013    Procedure: THROMBECTOMY FEMORAL ARTERY;  Surgeon: Wellington Hampshire, MD;  Location: [place] CATH LAB;  Service: Cardiovascular;  Laterality: Left;  . Abdominal aortagram N/A 08/29/2014    Procedure: ABDOMINAL AORTAGRAM;  Surgeon: Wellington Hampshire, MD;  Location: [facility] CATH LAB;  Service: Cardiovascular;  Laterality: N/A;  . Coronary artery bypass graft  nov 2011    x 4  . Partial knee arthroplasty Right 11/19/2014    Procedure: RIGHT UNI-KNEE ARTHROPLASTY MEDIALLY;  Surgeon: Mauri Pole, MD;  Location: [facility] ORS;  Service: Orthopedics;  Laterality: Right;  . Right partial knee replacement      Social History  Substance Use Topics  . Smoking status: Former Smoker    Types: Cigarettes    Quit date: 11/29/2008  . Smokeless tobacco: Never Used  . Alcohol Use: 1.8 oz/week    3 Shots of liquor per week     Comment: 2 drinks of brandy every week     Family History  Problem Relation Age of Onset  . Heart attack Father     died of MI at age 64  . Cancer Father   . Anesthesia problems Neg Hx   . Hypotension Neg Hx   . Malignant hyperthermia Neg Hx   . Pseudochol deficiency Neg Hx   . Colon cancer Neg Hx   . Esophageal cancer Neg Hx   . Prostate cancer Neg Hx   . Rectal cancer Neg Hx   . Stomach cancer Neg Hx     No Known Allergies  Medication list has been reviewed and updated.  Current Outpatient Prescriptions on File Prior to Visit  Medication Sig Dispense Refill  . aspirin EC 81 MG tablet Take 1 tablet (81 mg total) by mouth daily.     [name] atorvastatin (LIPITOR) 40 MG tablet Take 0.5 tablets (20 mg total) by mouth daily. 90 tablet 2  . clopidogrel (PLAVIX) 75 MG tablet Take 75 mg by mouth daily.    . fish oil-omega-3 fatty acids 1000 MG capsule Take 1 capsule (1 g total)  by mouth daily. 60 capsule 1  . gabapentin (NEURONTIN) 300 MG capsule Take 300 mg by mouth 3 (three) times daily.  2  . lisinopril (PRINIVIL,ZESTRIL) 5 MG tablet Take 0.5 tablets (2.5 mg total) by mouth daily. 45 tablet 3  . metoprolol tartrate (LOPRESSOR) 25 MG tablet Take 1 tablet (25 mg total) by mouth 2 (two) times daily. 180 tablet 2  . Multiple Vitamin (MULTIVITAMIN) tablet Take 1 tablet by mouth daily. 30 tablet 10  . allopurinol (ZYLOPRIM) 100 MG tablet Take 100 mg by mouth daily. Reported on 12/04/2015  0  . metFORMIN (GLUCOPHAGE) 500 MG tablet Take 1 tablet (500 mg total) by mouth 2 (two) times daily with a meal. (Patient not taking: Reported on 12/04/2015) 180 tablet 3  . terbinafine (LAMISIL) 1 % cream Apply 1 application topically 2 (two) times daily. Reported on 12/04/2015    . terbinafine (LAMISIL) 250 MG tablet Take 1 tablet (250 mg total) by mouth daily. (Patient not taking: Reported on 12/04/2015) 14 tablet 0  . testosterone cypionate (DEPOTESTOTERONE CYPIONATE) 200 MG/ML injection Inject 1 mL (200 mg total) into the muscle every 28 (twenty-eight) days. (Patient not taking: Reported on 08/16/2015) 10 mL 5  . vitamin B-12 (CYANOCOBALAMIN) 100 MCG tablet Take 100 mcg by mouth daily. Reported on 12/04/2015     No current facility-administered medications on file prior to visit.    Review of Systems:  As per HPI- otherwise negative.   Physical Examination: Filed Vitals:   12/04/15 0852  BP: 120/76  Pulse: 59  Temp: 98.2 F (36.8 C)  Resp: 16   Filed Vitals:   12/04/15 0852  Height: '5\' 8"'$  (1.727 m)  Weight: 208 lb 3.2 oz (94.439 kg)   Body mass index is 31.66 kg/(m^2). Ideal Body Weight: Weight in (lb) to have BMI = 25: 164.1  GEN:  WDWN, NAD, Non-toxic, A & O x 3, looks well HEENT: Atraumatic, Normocephalic. Neck supple. No masses, No LAD. Ears and Nose: No external deformity. CV: RRR, No M/G/R. No JVD. No thrill. No extra heart sounds. PULM: CTA B, no wheezes, crackles, rhonchi. No retractions. No resp. distress. No accessory muscle use. ABD: S, NT, ND, +BS. No rebound. No HSM. EXTR: No c/c/e NEURO Normal gait.  PSYCH: Normally interactive. Conversant. Not depressed or anxious appearing.  Calm demeanor.   [facility] reading (PRIMARY) by  Dr. Lorelei Pont. CXR: negative, history of CABG  EKG:NSR, no change from previous     Assessment and Plan: Pre-diabetes - Plan: Comprehensive metabolic panel, POCT glucose (manual entry), POCT glycosylated hemoglobin (Hb A1C), Lipid panel  Decreased exercise tolerance - Plan: Comprehensive metabolic panel, TSH, POCT CBC, DG Chest 2 View, EKG 12-Lead  Loss of weight - Plan: Comprehensive metabolic panel, TSH, HIV antibody  Screening examination for infectious disease - Plan: HIV antibody, Hepatitis C antibody  Coronary artery disease involving coronary bypass graft of native heart with unspecified angina pectoris  A1c is ok and stable.  Other labs pending to see if there is any apparent cause of his fatigue However I let him know that I am concerned about his SOB- he will follow-up with cardiology asap  Signed Lamar Blinks, MD  Called 1/25 and spoke with him-  We need him to come in for the PTH draw.  I will order this for him as a future He had a stress test today and thinks it was fine  Results for orders placed or performed in visit on 12/04/15  Comprehensive metabolic panel  Result Value Ref Range   Sodium 135 135 - 146 mmol/L   Potassium 4.4 3.5 - 5.3 mmol/L   Chloride 100 98 - 110 mmol/L   CO2 26 20 - 31 mmol/L   Glucose, Bld 126 (H) 65 - 99 mg/dL   BUN 9 7 - 25 mg/dL   Creat 1.00 0.70 - 1.33 mg/dL   Total Bilirubin 0.7 0.2 - 1.2 mg/dL   Alkaline Phosphatase 72 40  - 115 U/L   AST 31 10 - 35 U/L   ALT 32 9 - 46 U/L   Total Protein 7.9 6.1 - 8.1 g/dL   Albumin 5.1 3.6 - 5.1 g/dL   Calcium 10.5 (H) 8.6 - 10.3 mg/dL  TSH  Result Value Ref Range   TSH 1.549 0.350 - 4.500 uIU/mL  Lipid panel  Result Value Ref Range   Cholesterol 167 125 - 200 mg/dL   Triglycerides 181 (H) <150 mg/dL   HDL 48 >=40 mg/dL   Total CHOL/HDL Ratio 3.5 <=5.0 Ratio   VLDL 36 (H) <30 mg/dL   LDL Cholesterol 83 <130 mg/dL  HIV antibody  Result Value Ref Range   HIV 1&2 Ab, 4th Generation NONREACTIVE NONREACTIVE  Hepatitis C antibody  Result Value Ref Range   HCV Ab NEGATIVE NEGATIVE  POCT glucose (manual entry)  Result Value Ref Range   POC Glucose 131 (A) 70 - 99 mg/dl  POCT glycosylated hemoglobin (Hb A1C)  Result Value Ref Range   Hemoglobin A1C 6.4   POCT CBC  Result Value Ref Range   WBC 4.1 (A) 4.6 - 10.2 K/uL   Lymph, poc 2.7 0.6 - 3.4   POC LYMPH PERCENT 65.9 (A) 10 - 50 %L   MID (cbc) 0.3 0 - 0.9   POC MID % 6.1 0 - 12 %M   POC Granulocyte 1.1 (A) 2 - 6.9   Granulocyte percent 28.0 (A) 37 - 80 %G   RBC 4.27 (A) 4.69 - 6.13 M/uL   Hemoglobin 13.1 (A) 14.1 - 18.1 g/dL   HCT, POC 38.1 (A) 43.5 - 53.7 %   MCV 89.2 80 - 97 fL   MCH, POC 30.8 27 - 31.2 pg   MCHC 34.5 31.8 - 35.4 g/dL   RDW, POC 13.9 %   Platelet Count, POC 300 142 - 424 K/uL   MPV 7.7 0 - 99.8 fL

## 2015-12-05 ENCOUNTER — Telehealth: Payer: Self-pay | Admitting: *Deleted

## 2015-12-05 ENCOUNTER — Telehealth (HOSPITAL_COMMUNITY): Payer: Self-pay | Admitting: Radiology

## 2015-12-05 ENCOUNTER — Other Ambulatory Visit: Payer: Self-pay

## 2015-12-05 DIAGNOSIS — R0602 Shortness of breath: Secondary | ICD-10-CM

## 2015-12-05 DIAGNOSIS — R6889 Other general symptoms and signs: Secondary | ICD-10-CM

## 2015-12-05 NOTE — Telephone Encounter (Signed)
-----   Message from Darreld Mclean, MD sent at 12/05/2015  3:58 PM EST ----- Can we please add on a PTH level for hypercalcemia?  Thanks!

## 2015-12-05 NOTE — Telephone Encounter (Signed)
PTH is a frozen specimen.  Would you like for [place] to call the patient back in for another blood draw?

## 2015-12-05 NOTE — Telephone Encounter (Signed)
Patient given detailed instructions per Myocardial Perfusion Study Information Sheet for the test on 12/11/2015 at 7:45. Patient notified to arrive 15 minutes early and that it is imperative to arrive on time for appointment to keep from having the test rescheduled.  If you need to cancel or reschedule your appointment, please call the office within 24 hours of your appointment. Failure to do so may result in a cancellation of your appointment, and a $50 no show fee. Patient verbalized understanding.EHK

## 2015-12-06 ENCOUNTER — Encounter: Payer: Self-pay | Admitting: Family Medicine

## 2015-12-06 MED FILL — metFORMIN HCL 500 MG TABS: 500 | 90 days supply | Qty: 180 | Fill #1

## 2015-12-11 ENCOUNTER — Ambulatory Visit (HOSPITAL_COMMUNITY): Payer: Medicare Other | Attending: Internal Medicine

## 2015-12-11 ENCOUNTER — Encounter: Payer: Self-pay | Admitting: Family Medicine

## 2015-12-11 DIAGNOSIS — R0602 Shortness of breath: Secondary | ICD-10-CM | POA: Diagnosis not present

## 2015-12-11 DIAGNOSIS — R9439 Abnormal result of other cardiovascular function study: Secondary | ICD-10-CM | POA: Diagnosis not present

## 2015-12-11 DIAGNOSIS — R6889 Other general symptoms and signs: Secondary | ICD-10-CM | POA: Diagnosis not present

## 2015-12-11 DIAGNOSIS — M5136 Other intervertebral disc degeneration, lumbar region: Secondary | ICD-10-CM | POA: Diagnosis not present

## 2015-12-11 DIAGNOSIS — M545 Low back pain: Secondary | ICD-10-CM | POA: Diagnosis not present

## 2015-12-11 DIAGNOSIS — I10 Essential (primary) hypertension: Secondary | ICD-10-CM | POA: Insufficient documentation

## 2015-12-11 DIAGNOSIS — M5416 Radiculopathy, lumbar region: Secondary | ICD-10-CM | POA: Diagnosis not present

## 2015-12-11 DIAGNOSIS — M4726 Other spondylosis with radiculopathy, lumbar region: Secondary | ICD-10-CM | POA: Diagnosis not present

## 2015-12-11 DIAGNOSIS — I739 Peripheral vascular disease, unspecified: Secondary | ICD-10-CM | POA: Insufficient documentation

## 2015-12-11 DIAGNOSIS — M5126 Other intervertebral disc displacement, lumbar region: Secondary | ICD-10-CM | POA: Diagnosis not present

## 2015-12-11 LAB — MYOCARDIAL PERFUSION IMAGING
CHL CUP NUCLEAR SRS: 6
CHL CUP RESTING HR STRESS: 50 {beats}/min
LVDIAVOL: 122 mL
LVSYSVOL: 66 mL
NUC STRESS TID: 0.99
Peak HR: 57 {beats}/min
RATE: 0.27
SDS: 0
SSS: 6

## 2015-12-11 MED ORDER — TECHNETIUM TC 99M SESTAMIBI GENERIC - CARDIOLITE
11.0000 | Freq: Once | INTRAVENOUS | Status: AC | PRN
  Administered 2015-12-11: 11 via INTRAVENOUS

## 2015-12-11 MED ORDER — REGADENOSON 0.4 MG/5ML IV SOLN
0.4000 mg | Freq: Once | INTRAVENOUS | Status: AC
  Administered 2015-12-11: 0.4 mg via INTRAVENOUS

## 2015-12-11 MED ORDER — TECHNETIUM TC 99M SESTAMIBI GENERIC - CARDIOLITE
31.9000 | Freq: Once | INTRAVENOUS | Status: AC | PRN
  Administered 2015-12-11: 31.9 via INTRAVENOUS

## 2015-12-11 NOTE — Addendum Note (Signed)
Addended by: Lamar Blinks C on: 12/11/2015 05:02 PM   Modules accepted: Orders

## 2015-12-17 ENCOUNTER — Encounter: Payer: Self-pay | Admitting: Cardiovascular Disease

## 2015-12-17 ENCOUNTER — Ambulatory Visit (INDEPENDENT_AMBULATORY_CARE_PROVIDER_SITE_OTHER): Payer: Medicare Other | Admitting: Cardiovascular Disease

## 2015-12-17 VITALS — BP 128/66 | HR 76 | Ht 68.0 in | Wt 215.0 lb

## 2015-12-17 DIAGNOSIS — R931 Abnormal findings on diagnostic imaging of heart and coronary circulation: Secondary | ICD-10-CM

## 2015-12-17 DIAGNOSIS — I25708 Atherosclerosis of coronary artery bypass graft(s), unspecified, with other forms of angina pectoris: Secondary | ICD-10-CM | POA: Diagnosis not present

## 2015-12-17 DIAGNOSIS — I739 Peripheral vascular disease, unspecified: Secondary | ICD-10-CM | POA: Diagnosis not present

## 2015-12-17 DIAGNOSIS — I10 Essential (primary) hypertension: Secondary | ICD-10-CM

## 2015-12-17 DIAGNOSIS — I25118 Atherosclerotic heart disease of native coronary artery with other forms of angina pectoris: Secondary | ICD-10-CM | POA: Diagnosis not present

## 2015-12-17 DIAGNOSIS — E785 Hyperlipidemia, unspecified: Secondary | ICD-10-CM

## 2015-12-17 LAB — CBC
HCT: 38.9 % — ABNORMAL LOW (ref 39.0–52.0)
HEMOGLOBIN: 13.3 g/dL (ref 13.0–17.0)
MCH: 30.4 pg (ref 26.0–34.0)
MCHC: 34.2 g/dL (ref 30.0–36.0)
MCV: 89 fL (ref 78.0–100.0)
MPV: 11 fL (ref 8.6–12.4)
PLATELETS: 270 10*3/uL (ref 150–400)
RBC: 4.37 MIL/uL (ref 4.22–5.81)
RDW: 14.9 % (ref 11.5–15.5)
WBC: 4.8 10*3/uL (ref 4.0–10.5)

## 2015-12-17 LAB — BASIC METABOLIC PANEL
BUN: 12 mg/dL (ref 7–25)
CHLORIDE: 96 mmol/L — AB (ref 98–110)
CO2: 27 mmol/L (ref 20–31)
CREATININE: 0.93 mg/dL (ref 0.70–1.33)
Calcium: 10.2 mg/dL (ref 8.6–10.3)
GLUCOSE: 93 mg/dL (ref 65–99)
Potassium: 4.6 mmol/L (ref 3.5–5.3)
Sodium: 138 mmol/L (ref 135–146)

## 2015-12-17 LAB — PROTIME-INR
INR: 1.03 (ref <1.50)
PROTHROMBIN TIME: 13.6 s (ref 11.6–15.2)

## 2015-12-17 NOTE — Assessment & Plan Note (Signed)
Blood pressure is well controlled on current medications. 

## 2015-12-17 NOTE — Assessment & Plan Note (Signed)
He reports no recurrent claudication. Most recent ABI was normal bilaterally.

## 2015-12-17 NOTE — Assessment & Plan Note (Signed)
The patient is having worsening exertional dyspnea with exertional chest tightness suggestive of class III angina in spite of being on metoprolol. Recent nuclear stress test was abnormal and showed possible inferior wall infarct which is new with mildly reduced LV systolic function. Given his symptoms and stress test findings, I recommend proceeding with cardiac catheterization and possible coronary intervention. I will plan access via the left radial artery.

## 2015-12-17 NOTE — Patient Instructions (Signed)
Medication Instructions:  Your physician recommends that you continue on your current medications as directed. Please refer to the Current Medication list given to you today.  Labwork: Your physician recommends that you have lab work today: BMP, CBC and PT/INR  Testing/Procedures: Your physician has requested that you have a cardiac catheterization. Cardiac catheterization is used to diagnose and/or treat various heart conditions. Doctors may recommend this procedure for a number of different reasons. The most common reason is to evaluate chest pain. Chest pain can be a symptom of coronary artery disease (CAD), and cardiac catheterization can show whether plaque is narrowing or blocking your heart's arteries. This procedure is also used to evaluate the valves, as well as measure the blood flow and oxygen levels in different parts of your heart. For further information please visit [email or web address]. Please follow instruction sheet, as given.  Follow-Up: Your physician recommends that you schedule a follow-up appointment in: 3 WEEKS with Dr Fletcher Anon  Any Other Special Instructions Will Be Listed Below (If Applicable).     If you need a refill on your cardiac medications before your next appointment, please call your pharmacy.

## 2015-12-17 NOTE — Progress Notes (Signed)
HPI: This is a 60 year old male who is here today for a followup visit regarding  peripheral arterial disease and coronary artery disease. He has known history of coronary artery disease with multiple interventions in the past. He was found to have three-vessel coronary artery disease in 2011 and underwent coronary artery bypass graft surgery.  He quit smoking in 2009.  He has known history of peripheral arterial disease with intervention on both SFAs. He reports no claudication. Over the last month, he has experienced worsening exertional dyspnea and fatigue with occasional substernal chest tightness especially when he goes upstairs. He suffers from chronic back pain which has worsened recently. He has not been able to do much walking because of that. He underwent a pharmacologic nuclear stress test recently which showed a drop in his ejection fraction to 46% with a possible old inferior infarct. This is different from his prior stress test in 2013 which was normal.  No Known Allergies   Current Outpatient Prescriptions on File Prior to Visit  Medication Sig Dispense Refill  . allopurinol (ZYLOPRIM) 100 MG tablet Take 100 mg by mouth daily. Reported on 12/04/2015  0  . aspirin EC 81 MG tablet Take 1 tablet (81 mg total) by mouth daily.    [name] atorvastatin (LIPITOR) 40 MG tablet Take 0.5 tablets (20 mg total) by mouth daily. 90 tablet 2  . clopidogrel (PLAVIX) 75 MG tablet Take 75 mg by mouth daily.    . fish oil-omega-3 fatty acids 1000 MG capsule Take 1 capsule (1 g total) by mouth daily. 60 capsule 1  . gabapentin (NEURONTIN) 300 MG capsule Take 300-600 mg by mouth 3 (three) times daily as needed.   2  . lisinopril (PRINIVIL,ZESTRIL) 5 MG tablet Take 0.5 tablets (2.5 mg total) by mouth daily. 45 tablet 3  . metFORMIN (GLUCOPHAGE) 500 MG tablet Take 1 tablet (500 mg total) by mouth 2 (two) times daily with a meal. 180 tablet 3  . metoprolol tartrate (LOPRESSOR) 25 MG tablet Take 1 tablet (25  mg total) by mouth 2 (two) times daily. 180 tablet 2  . Multiple Vitamin (MULTIVITAMIN) tablet Take 1 tablet by mouth daily. 30 tablet 10  . vitamin B-12 (CYANOCOBALAMIN) 100 MCG tablet Take 100 mcg by mouth daily. Reported on 12/04/2015     No current facility-administered medications on file prior to visit.     Past Medical History  Diagnosis Date  . PAD (peripheral artery disease) ([place])     a. s/p bilat SFA stents;  b. ABIs 11/2012: R 0.99, L 0.86.  [name] Hypertension   . Hyperlipidemia   . Anemia, unspecified   . GERD (gastroesophageal reflux disease)   . Helicobacter pylori (H. pylori) infection   . Hemorrhoids   . Impotence of organic origin   . Bell's palsy   . CAD (coronary artery disease)     a. s/p multiple PCIs;  b. s/p CABG in 09/2010 (LIMA-LAD, SVG-OM1, SVG-distal RCA/OM2);  c.  Myoview 05/2012: EF 54%, no ischemia, no scar.   . Blood transfusion     during treatment for Ca  . DJD (degenerative joint disease)     low back & all over   . Pancreatic cancer [facility])     surgery 2009  . Myocardial infarction ([place])     2005 and 2012   . Chronic back pain   . Blood transfusion without reported diagnosis   . Pre-diabetes 08/16/2015     Past Surgical History  Procedure Laterality  Date  . Pancreatic  cancer  05/10/2008    Resection of distal panrease and spleen  . Splenectomy    . Back surgery      1992  . Peripheral vascular catheterization  Oct 2014    Lt SFA PTA  . Hemorrhoid surgery  10/30/2011    Procedure: HEMORRHOIDECTOMY;  Surgeon: Harl Bowie, MD;  Location: [place];  Service: General;  Laterality: N/A;  . Peripheral vascular catheterization  Oct 2015    ISR Lt SFA-PTA  . Abdominal aortagram N/A 09/13/2013    Procedure: ABDOMINAL AORTAGRAM;  Surgeon: Wellington Hampshire, MD;  Location: [facility] CATH LAB;  Service: Cardiovascular;  Laterality: N/A;  . Thrombectomy femoral artery Left 09/13/2013    Procedure: THROMBECTOMY FEMORAL ARTERY;  Surgeon: Wellington Hampshire, MD;   Location: [place] CATH LAB;  Service: Cardiovascular;  Laterality: Left;  . Abdominal aortagram N/A 08/29/2014    Procedure: ABDOMINAL AORTAGRAM;  Surgeon: Wellington Hampshire, MD;  Location: [facility] CATH LAB;  Service: Cardiovascular;  Laterality: N/A;  . Coronary artery bypass graft  nov 2011    x 4  . Partial knee arthroplasty Right 11/19/2014    Procedure: RIGHT UNI-KNEE ARTHROPLASTY MEDIALLY;  Surgeon: Mauri Pole, MD;  Location: [facility];  Service: Orthopedics;  Laterality: Right;  . Right partial knee replacement       Family History  Problem Relation Age of Onset  . Heart attack Father     died of MI at age 59  . Cancer Father   . Anesthesia problems Neg Hx   . Hypotension Neg Hx   . Malignant hyperthermia Neg Hx   . Pseudochol deficiency Neg Hx   . Colon cancer Neg Hx   . Esophageal cancer Neg Hx   . Prostate cancer Neg Hx   . Rectal cancer Neg Hx   . Stomach cancer Neg Hx      Social History   Social History  . Marital Status: Single    Spouse Name: N/A  . Number of Children: 0  . Years of Education: N/A   Occupational History  . Diability    Social History Main Topics  . Smoking status: Former Smoker    Types: Cigarettes    Quit date: 11/29/2008  . Smokeless tobacco: Never Used  . Alcohol Use: 1.8 oz/week    3 Shots of liquor per week     Comment: 2 drinks of brandy every week   . Drug Use: No  . Sexual Activity: Not Currently   Other Topics Concern  . 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 He appears well-developed and well-nourished. No distress.  HENT: No nasal discharge.  Head: Normocephalic and atraumatic.  Eyes: Pupils are equal and round. Right eye exhibits no discharge. Left eye  exhibits no discharge.  Neck: Normal range of motion. Neck supple. No JVD present. No thyromegaly present.  Cardiovascular: Normal rate, regular rhythm, normal heart sounds and. Exam reveals no gallop and no friction rub. No murmur heard.  Pulmonary/Chest: Effort normal and breath sounds normal. No stridor. No respiratory distress. He has no wheezes. He has no  rales. He exhibits no tenderness.  Abdominal: Soft. Bowel sounds are normal. He exhibits no distension. There is no tenderness. There is no rebound and no guarding.  Musculoskeletal: Normal range of motion. He exhibits no edema and no tenderness.  Neurological: He is alert and oriented to person, place, and time. Coordination normal.  Skin: Skin is warm and dry. No rash noted. He is not diaphoretic. No erythema. No pallor.  Psychiatric: He has a normal mood and affect. His behavior is normal. Judgment and thought content normal.  Vascular: Femoral pulses are slightly diminished bilaterally. PT: +1 in the right side and +1 on the left side, DP: + 1 On the right side and 0 on the left side. Radial pulses normal bilaterally.      ASSESSMENT AND PLAN

## 2015-12-17 NOTE — Assessment & Plan Note (Signed)
Lab Results  Component Value Date   CHOL 167 12/04/2015   HDL 48 12/04/2015   LDLCALC 83 12/04/2015        TRIG 181* 12/04/2015   CHOLHDL 3.5 12/04/2015   Continue treatment with atorvastatin.

## 2015-12-25 ENCOUNTER — Ambulatory Visit (HOSPITAL_COMMUNITY)
Admission: RE | Admit: 2015-12-25 | Discharge: 2015-12-25 | Disposition: A | Discharge status: Home / Self Care | Payer: Medicare Other | Source: Ambulatory Visit | Attending: Cardiovascular Disease | Admitting: Cardiovascular Disease

## 2015-12-25 ENCOUNTER — Ambulatory Visit (INDEPENDENT_AMBULATORY_CARE_PROVIDER_SITE_OTHER): Payer: Medicare Other | Admitting: Family Medicine

## 2015-12-25 ENCOUNTER — Encounter (HOSPITAL_COMMUNITY)
Admission: RE | Disposition: A | Discharge status: Home / Self Care | Payer: Self-pay | Source: Ambulatory Visit | Attending: Cardiovascular Disease

## 2015-12-25 VITALS — BP 124/78 | HR 71 | Temp 98.3°F | Resp 17 | Ht 70.0 in | Wt 214.0 lb

## 2015-12-25 DIAGNOSIS — Z7902 Long term (current) use of antithrombotics/antiplatelets: Secondary | ICD-10-CM | POA: Diagnosis not present

## 2015-12-25 DIAGNOSIS — K219 Gastro-esophageal reflux disease without esophagitis: Secondary | ICD-10-CM | POA: Diagnosis not present

## 2015-12-25 DIAGNOSIS — R7303 Prediabetes: Secondary | ICD-10-CM | POA: Insufficient documentation

## 2015-12-25 DIAGNOSIS — I2582 Chronic total occlusion of coronary artery: Secondary | ICD-10-CM | POA: Insufficient documentation

## 2015-12-25 DIAGNOSIS — Z955 Presence of coronary angioplasty implant and graft: Secondary | ICD-10-CM | POA: Insufficient documentation

## 2015-12-25 DIAGNOSIS — E785 Hyperlipidemia, unspecified: Secondary | ICD-10-CM | POA: Insufficient documentation

## 2015-12-25 DIAGNOSIS — Z7982 Long term (current) use of aspirin: Secondary | ICD-10-CM | POA: Insufficient documentation

## 2015-12-25 DIAGNOSIS — Z87891 Personal history of nicotine dependence: Secondary | ICD-10-CM | POA: Diagnosis not present

## 2015-12-25 DIAGNOSIS — M79671 Pain in right foot: Secondary | ICD-10-CM | POA: Diagnosis not present

## 2015-12-25 DIAGNOSIS — M549 Dorsalgia, unspecified: Secondary | ICD-10-CM | POA: Diagnosis not present

## 2015-12-25 DIAGNOSIS — D649 Anemia, unspecified: Secondary | ICD-10-CM | POA: Diagnosis not present

## 2015-12-25 DIAGNOSIS — G8929 Other chronic pain: Secondary | ICD-10-CM | POA: Diagnosis not present

## 2015-12-25 DIAGNOSIS — I739 Peripheral vascular disease, unspecified: Secondary | ICD-10-CM | POA: Insufficient documentation

## 2015-12-25 DIAGNOSIS — M1990 Unspecified osteoarthritis, unspecified site: Secondary | ICD-10-CM | POA: Insufficient documentation

## 2015-12-25 DIAGNOSIS — Z8249 Family history of ischemic heart disease and other diseases of the circulatory system: Secondary | ICD-10-CM | POA: Diagnosis not present

## 2015-12-25 DIAGNOSIS — I252 Old myocardial infarction: Secondary | ICD-10-CM | POA: Diagnosis not present

## 2015-12-25 DIAGNOSIS — I25708 Atherosclerosis of coronary artery bypass graft(s), unspecified, with other forms of angina pectoris: Secondary | ICD-10-CM | POA: Diagnosis not present

## 2015-12-25 DIAGNOSIS — I25810 Atherosclerosis of coronary artery bypass graft(s) without angina pectoris: Secondary | ICD-10-CM | POA: Insufficient documentation

## 2015-12-25 DIAGNOSIS — I10 Essential (primary) hypertension: Secondary | ICD-10-CM | POA: Diagnosis not present

## 2015-12-25 DIAGNOSIS — I25718 Atherosclerosis of autologous vein coronary artery bypass graft(s) with other forms of angina pectoris: Secondary | ICD-10-CM | POA: Diagnosis not present

## 2015-12-25 DIAGNOSIS — Z7984 Long term (current) use of oral hypoglycemic drugs: Secondary | ICD-10-CM | POA: Diagnosis not present

## 2015-12-25 HISTORY — PX: CARDIAC CATHETERIZATION: SHX172

## 2015-12-25 LAB — GLUCOSE, CAPILLARY
Glucose-Capillary: 81 mg/dL (ref 65–99)
Glucose-Capillary: 69 mg/dL (ref 65–99)

## 2015-12-25 LAB — URIC ACID: URIC ACID, SERUM: 9.1 mg/dL — AB (ref 4.0–7.8)

## 2015-12-25 LAB — BASIC METABOLIC PANEL
BUN: 13 mg/dL (ref 7–25)
CALCIUM: 9.7 mg/dL (ref 8.6–10.3)
CO2: 25 mmol/L (ref 20–31)
CREATININE: 0.89 mg/dL (ref 0.70–1.33)
Chloride: 101 mmol/L (ref 98–110)
Glucose, Bld: 90 mg/dL (ref 65–99)
Potassium: 4.2 mmol/L (ref 3.5–5.3)
Sodium: 137 mmol/L (ref 135–146)

## 2015-12-25 LAB — CK: Total CK: 398 U/L — ABNORMAL HIGH (ref 7–232)

## 2015-12-25 SURGERY — LEFT HEART CATH AND CORS/GRAFTS ANGIOGRAPHY
Anesthesia: LOCAL

## 2015-12-25 MED ORDER — SODIUM CHLORIDE 0.9% FLUSH: 3.0000 mL | INTRAVENOUS | Status: DC | PRN

## 2015-12-25 MED ORDER — ASPIRIN 81 MG PO CHEW
81.0000 mg | CHEWABLE_TABLET | ORAL | Status: DC
Start: 2015-12-25 — End: 2015-12-25

## 2015-12-25 MED ORDER — VERAPAMIL HCL 2.5 MG/ML IV SOLN
INTRAVENOUS | Status: DC | PRN
  Administered 2015-12-25: 10 mL via INTRA_ARTERIAL

## 2015-12-25 MED ORDER — HYDROCODONE-ACETAMINOPHEN 5-325 MG PO TABS: 1.0000 | ORAL_TABLET | Freq: Three times a day (TID) | ORAL | Status: DC | PRN

## 2015-12-25 MED ORDER — HEPARIN (PORCINE) IN NACL 2-0.9 UNIT/ML-% IJ SOLN
INTRAMUSCULAR | Status: DC | PRN
  Administered 2015-12-25: 1000 mL

## 2015-12-25 MED ORDER — HEPARIN (PORCINE) IN NACL 2-0.9 UNIT/ML-% IJ SOLN
INTRAMUSCULAR | Status: AC
  Filled 2015-12-25: qty 1000

## 2015-12-25 MED ORDER — MIDAZOLAM HCL 2 MG/2ML IJ SOLN
INTRAMUSCULAR | Status: DC | PRN
  Administered 2015-12-25: 1 mg via INTRAVENOUS

## 2015-12-25 MED ORDER — SODIUM CHLORIDE 0.9 % WEIGHT BASED INFUSION: 1.0000 mL/kg/h | INTRAVENOUS | Status: DC

## 2015-12-25 MED ORDER — VERAPAMIL HCL 2.5 MG/ML IV SOLN
INTRAVENOUS | Status: AC
  Filled 2015-12-25: qty 2

## 2015-12-25 MED ORDER — MIDAZOLAM HCL 2 MG/2ML IJ SOLN
INTRAMUSCULAR | Status: AC
  Filled 2015-12-25: qty 2

## 2015-12-25 MED ORDER — SODIUM CHLORIDE 0.9% FLUSH: 3.0000 mL | Freq: Two times a day (BID) | INTRAVENOUS | Status: DC

## 2015-12-25 MED ORDER — FENTANYL CITRATE (PF) 100 MCG/2ML IJ SOLN
INTRAMUSCULAR | Status: AC
  Filled 2015-12-25: qty 2

## 2015-12-25 MED ORDER — FENTANYL CITRATE (PF) 100 MCG/2ML IJ SOLN
INTRAMUSCULAR | Status: DC | PRN
  Administered 2015-12-25: 50 ug via INTRAVENOUS

## 2015-12-25 MED ORDER — SODIUM CHLORIDE 0.9 % IV SOLN: 250.0000 mL | INTRAVENOUS | Status: DC | PRN

## 2015-12-25 MED ORDER — SODIUM CHLORIDE 0.9 % IV SOLN
INTRAVENOUS | Status: DC
  Administered 2015-12-25: 13:00:00 via INTRAVENOUS

## 2015-12-25 MED ORDER — HEPARIN SODIUM (PORCINE) 1000 UNIT/ML IJ SOLN
INTRAMUSCULAR | Status: AC
  Filled 2015-12-25: qty 1

## 2015-12-25 MED ORDER — IOHEXOL 350 MG/ML SOLN
INTRAVENOUS | Status: DC | PRN
  Administered 2015-12-25: 90 mL via INTRA_ARTERIAL

## 2015-12-25 MED ORDER — LIDOCAINE HCL (PF) 1 % IJ SOLN
INTRAMUSCULAR | Status: AC
  Filled 2015-12-25: qty 30

## 2015-12-25 MED ORDER — HEPARIN SODIUM (PORCINE) 1000 UNIT/ML IJ SOLN
INTRAMUSCULAR | Status: DC | PRN
  Administered 2015-12-25: 4000 [IU] via INTRAVENOUS

## 2015-12-25 MED ORDER — LIDOCAINE HCL (PF) 1 % IJ SOLN
INTRAMUSCULAR | Status: DC | PRN
  Administered 2015-12-25: 2 mL via INTRADERMAL

## 2015-12-25 SURGICAL SUPPLY — 14 items
CATH INFINITI 5 FR AR1 MOD (CATHETERS) ×1 IMPLANT
CATH INFINITI 5FR ANG PIGTAIL (CATHETERS) ×2 IMPLANT
CATH INFINITI 5FR MULTPACK ANG (CATHETERS) IMPLANT
CATH OPTITORQUE TIG 4.0 5F (CATHETERS) ×1 IMPLANT
DEVICE RAD COMP TR BAND LRG (VASCULAR PRODUCTS) ×2 IMPLANT
GLIDESHEATH SLEND SS 6F .021 (SHEATH) ×2 IMPLANT
KIT HEART LEFT (KITS) ×2 IMPLANT
PACK CARDIAC CATHETERIZATION (CUSTOM PROCEDURE TRAY) ×2 IMPLANT
SHEATH PINNACLE 5F 10CM (SHEATH) IMPLANT
SYR MEDRAD MARK V 150ML (SYRINGE) ×2 IMPLANT
TRANSDUCER W/STOPCOCK (MISCELLANEOUS) ×2 IMPLANT
TUBING CIL FLEX 10 FLL-RA (TUBING) ×2 IMPLANT
WIRE EMERALD 3MM-J .035X150CM (WIRE) IMPLANT
WIRE SAFE-T 1.5MM-J .035X260CM (WIRE) ×2 IMPLANT

## 2015-12-25 NOTE — Discharge Instructions (Signed)
Do Not restart metformin until Sat 12-28-2015 Radial Site Care Refer to this sheet in the next few weeks. These instructions provide you with information about caring for yourself after your procedure. Your health care provider may also give you more specific instructions. Your treatment has been planned according to current medical practices, but problems sometimes occur. Call your health care provider if you have any problems or questions after your procedure. WHAT TO EXPECT AFTER THE PROCEDURE After your procedure, it is typical to have the following:  Bruising at the radial site that usually fades within 1-2 weeks.  Blood collecting in the tissue (hematoma) that may be painful to the touch. It should usually decrease in size and tenderness within 1-2 weeks. HOME CARE INSTRUCTIONS  Take medicines only as directed by your health care provider.  You may shower 24-48 hours after the procedure or as directed by your health care provider. Remove the bandage (dressing) and gently wash the site with plain soap and water. Pat the area dry with a clean towel. Do not rub the site, because this may cause bleeding.  Do not take baths, swim, or use a hot tub until your health care provider approves.  Check your insertion site every day for redness, swelling, or drainage.  Do not apply powder or lotion to the site.  Do not flex or bend the affected arm for 24 hours or as directed by your health care provider.  Do not push or pull heavy objects with the affected arm for 24 hours or as directed by your health care provider.  Do not lift over 10 lb (4.5 kg) for 5 days after your procedure or as directed by your health care provider.  Ask your health care provider when it is okay to:  Return to work or school.  Resume usual physical activities or sports.  Resume sexual activity.  Do not drive home if you are discharged the same day as the procedure. Have someone else drive you.  You may drive  24 hours after the procedure unless otherwise instructed by your health care provider.  Do not operate machinery or power tools for 24 hours after the procedure.  If your procedure was done as an outpatient procedure, which means that you went home the same day as your procedure, a responsible adult should be with you for the first 24 hours after you arrive home.  Keep all follow-up visits as directed by your health care provider. This is important. SEEK MEDICAL CARE IF:  You have a fever.  You have chills.  You have increased bleeding from the radial site. Hold pressure on the site. SEEK IMMEDIATE MEDICAL CARE IF:  You have unusual pain at the radial site.  You have redness, warmth, or swelling at the radial site.  You have drainage (other than a small amount of blood on the dressing) from the radial site.  The radial site is bleeding, and the bleeding does not stop after 30 minutes of holding steady pressure on the site.  Your arm or hand becomes pale, cool, tingly, or numb.   This information is not intended to replace advice given to you by your health care provider. Make sure you discuss any questions you have with your health care provider.   Document Released: 12/05/2010 Document Revised: 11/23/2014 Document Reviewed: 05/21/2014 Elsevier Interactive Patient Education [employer].

## 2015-12-25 NOTE — Patient Instructions (Signed)
I hope that your cardiac cath is painless for you!  I will be in touch with your labs asap You can use the hydrocodone as needed for pain in your foot- remember that this medication will make you sleepy.  Avoid using it with flexeril or other sedating medications and do not drive while you are on this medication

## 2015-12-25 NOTE — Interval H&P Note (Signed)
Cath Lab Visit (complete for each Cath Lab visit)  Clinical Evaluation Leading to the Procedure:   ACS: No.  Non-ACS:    Anginal Classification: CCS III  Anti-ischemic medical therapy: Minimal Therapy (1 class of medications)  Non-Invasive Test Results: Intermediate-risk stress test findings: cardiac mortality 1-3%/year  Prior CABG: Previous CABG      History and Physical Interval Note:  12/25/2015 3:28 PM  [name]  has presented today for surgery, with the diagnosis of cp, abnormal stress test  The various methods of treatment have been discussed with the patient and family. After consideration of risks, benefits and other options for treatment, the patient has consented to  Procedure(s): Left Heart Cath and Cors/Grafts Angiography (N/A) as a surgical intervention .  The patient's history has been reviewed, patient examined, no change in status, stable for surgery.  I have reviewed the patient's chart and labs.  Questions were answered to the patient's satisfaction.     Kathlyn [place]

## 2015-12-25 NOTE — Progress Notes (Addendum)
[facility] [address], [place] [address] [phone]  Date:  12/25/2015   Name:  Steven Hale.   DOB:  [birth year]   MRN:  [id number]  PCP:  Lamar Blinks, MD    Chief Complaint: Foot Pain   History of Present Illness:  Steven Erman. is a 60 y.o. very pleasant male patient who presents with the following:  History of pre-diabetes and peripheral neuropathy. Also CVD- Dr. Fletcher Anon saw him a week ago and they plan to do a cath later today! He is on neurontin and metfomrin as well as plavix He has noted increased pain in the right foot since last night.  He is not aware of any injury.  He tried heat and ice but noted that it still hurt to try and walk on it today  He did exercise yesterday am- he walked on the treadmill. However this was not out of the ordinary for him  He had some strange blistering of the right foot in the fall but this has been resolved for 2-3 months  His flexeril is not relieving his pain and we cannot use nsaids right now  We did x-ray of the right foot in September: RIGHT FOOT COMPLETE - 3+ VIEW  COMPARISON: None.  FINDINGS: No bony destructive change or periosteal reaction is identified. There is no fracture or dislocation. Mild to moderate first MTP osteoarthritis is noted. No radiopaque foreign body or soft tissue gas collection is seen.  IMPRESSION: No acute abnormality.  Mild to moderate first MTP osteoarthritis.  Lab Results  Component Value Date   HGBA1C 6.4 12/04/2015     Patient Active Problem List   Diagnosis Date Noted  . Pre-diabetes 08/16/2015  . Peripheral neuropathy ([place]) 07/09/2015  . Other male erectile dysfunction 03/07/2015  . Essential hypertension, benign 03/07/2015  . Pure hypercholesterolemia 12/10/2014  . Essential hypertension 12/10/2014  . Low testosterone 12/10/2014  . S/P right UKR 11/19/2014  . Lumbar disc herniation with radiculopathy 10/15/2014  . Back pain 10/15/2014   . Acute back pain   . Chronic back pain 10/12/2014  . H/O pancreatic cancer 2009 10/12/2014  . Iron deficiency anemia 06/11/2014  . Testosterone deficiency 09/07/2013  . Benign paroxysmal positional vertigo 08/30/2013  . PAD (peripheral artery disease) ([place]) 06/02/2012  . Claudication ([place]) 05/14/2011  . BLOOD IN STOOL 09/20/2009  . PVD (peripheral vascular disease) ([place]) 01/08/2009  . BELL'S PALSY, RIGHT 12/25/2008  . Hyperlipidemia 06/05/2008  . CAD- multiple PCIs, CABG 2011, low risk Myoview 05/2012 11/17/2003    Past Medical History  Diagnosis Date  . PAD (peripheral artery disease) ([place])     a. s/p bilat SFA stents;  b. ABIs 11/2012: R 0.99, L 0.86.  [name] Hypertension   . Hyperlipidemia   . Anemia, unspecified   . GERD (gastroesophageal reflux disease)   . Helicobacter pylori (H. pylori) infection   . Hemorrhoids   . Impotence of organic origin   . Bell's palsy   . CAD (coronary artery disease)     a. s/p multiple PCIs;  b. s/p CABG in 09/2010 (LIMA-LAD, SVG-OM1, SVG-distal RCA/OM2);  c.  Myoview 05/2012: EF 54%, no ischemia, no scar.   . Blood transfusion     during treatment for Ca  . DJD (degenerative joint disease)     low back & all over   . Pancreatic cancer [facility])     surgery 2009  . Myocardial infarction ([place])     2005 and  2012   . Chronic back pain   . Blood transfusion without reported diagnosis   . Pre-diabetes 08/16/2015    Past Surgical History  Procedure Laterality Date  . Pancreatic  cancer  05/10/2008    Resection of distal panrease and spleen  . Splenectomy    . Back surgery      1992  . Peripheral vascular catheterization  Oct 2014    Lt SFA PTA  . Hemorrhoid surgery  10/30/2011    Procedure: HEMORRHOIDECTOMY;  Surgeon: Harl Bowie, MD;  Location: [place];  Service: General;  Laterality: N/A;  . Peripheral vascular catheterization  Oct 2015    ISR Lt SFA-PTA  . Abdominal aortagram N/A 09/13/2013    Procedure: ABDOMINAL AORTAGRAM;  Surgeon:  Wellington Hampshire, MD;  Location: [facility] CATH LAB;  Service: Cardiovascular;  Laterality: N/A;  . Thrombectomy femoral artery Left 09/13/2013    Procedure: THROMBECTOMY FEMORAL ARTERY;  Surgeon: Wellington Hampshire, MD;  Location: [place] CATH LAB;  Service: Cardiovascular;  Laterality: Left;  . Abdominal aortagram N/A 08/29/2014    Procedure: ABDOMINAL AORTAGRAM;  Surgeon: Wellington Hampshire, MD;  Location: [facility] CATH LAB;  Service: Cardiovascular;  Laterality: N/A;  . Coronary artery bypass graft  nov 2011    x 4  . Partial knee arthroplasty Right 11/19/2014    Procedure: RIGHT UNI-KNEE ARTHROPLASTY MEDIALLY;  Surgeon: Mauri Pole, MD;  Location: [facility];  Service: Orthopedics;  Laterality: Right;  . Right partial knee replacement      Social History  Substance Use Topics  . Smoking status: Former Smoker    Types: Cigarettes    Quit date: 11/29/2008  . Smokeless tobacco: Never Used  . Alcohol Use: 1.8 oz/week    3 Shots of liquor per week     Comment: 2 drinks of brandy every week     Family History  Problem Relation Age of Onset  . Heart attack Father     died of MI at age 100  . Cancer Father   . Anesthesia problems Neg Hx   . Hypotension Neg Hx   . Malignant hyperthermia Neg Hx   . Pseudochol deficiency Neg Hx   . Colon cancer Neg Hx   . Esophageal cancer Neg Hx   . Prostate cancer Neg Hx   . Rectal cancer Neg Hx   . Stomach cancer Neg Hx     No Known Allergies  Medication list has been reviewed and updated.  Current Outpatient Prescriptions on File Prior to Visit  Medication Sig Dispense Refill  . aspirin EC 81 MG tablet Take 1 tablet (81 mg total) by mouth daily.    [name] atorvastatin (LIPITOR) 40 MG tablet Take 0.5 tablets (20 mg total) by mouth daily. 90 tablet 2  . clopidogrel (PLAVIX) 75 MG tablet Take 75 mg by mouth daily.    . cyclobenzaprine (FLEXERIL) 10 MG tablet Take 5-10 mg by mouth 3 (three) times daily as needed for muscle spasms.   3  . fish oil-omega-3 fatty acids 1000  MG capsule Take 1 capsule (1 g total) by mouth daily. 60 capsule 1  . gabapentin (NEURONTIN) 300 MG capsule Take 600 mg by mouth 3 (three) times daily.   2  . lisinopril (PRINIVIL,ZESTRIL) 5 MG tablet Take 0.5 tablets (2.5 mg total) by mouth daily. 45 tablet 3  . metFORMIN (GLUCOPHAGE) 500 MG tablet Take 1 tablet (500 mg total) by mouth 2 (two) times daily with a meal. 180 tablet 3  .  metoprolol tartrate (LOPRESSOR) 25 MG tablet Take 1 tablet (25 mg total) by mouth 2 (two) times daily. 180 tablet 2  . Multiple Vitamin (MULTIVITAMIN) tablet Take 1 tablet by mouth daily. 30 tablet 10  . vitamin B-12 (CYANOCOBALAMIN) 100 MCG tablet Take 100 mcg by mouth daily. Reported on 12/25/2015     No current facility-administered medications on file prior to visit.    Review of Systems:  As per HPI- otherwise negative.   Physical Examination: Filed Vitals:   12/25/15 0828  BP: 124/78  Pulse: 71  Temp: 98.3 F (36.8 C)  Resp: 17   Filed Vitals:   12/25/15 0828  Height: '5\' 10"'$  (1.778 m)  Weight: 214 lb (97.07 kg)   Body mass index is 30.71 kg/(m^2). Ideal Body Weight: Weight in (lb) to have BMI = 25: 173.9  GEN: WDWN, NAD, Non-toxic, A & O x 3, looks well HEENT: Atraumatic, Normocephalic. Neck supple. No masses, No LAD. Ears and Nose: No external deformity. CV: RRR, No M/G/R. No JVD. No thrill. No extra heart sounds. PULM: CTA B, no wheezes, crackles, rhonchi. No retractions. No resp. distress. No accessory muscle use. EXTR: No c/c/e NEURO slightly favoring right Right foot: mild TTP over the MT joints, but no redness, swelling, heat or specific point tenderness. Normal motion of the foot and ankle.  Foot is NV intact, normal monofilament testing.  He mostly has pain when he stands on the foot Not tender at the plantar fascia insertion, achilles is normal PSYCH: Normally interactive. Conversant. Not depressed or anxious appearing.  Calm demeanor.    Assessment and Plan: Right foot pain -  Plan: Uric Acid, Basic metabolic panel, CK, HYDROcodone-acetaminophen (NORCO/VICODIN) 5-325 MG tablet  Will look for any other cause of his pain but do not really suspect gout.  His pain control options are limited- gave a small supply of hydrocodone which he can also use for any pain following his cath procedure.  Will be in touch with him regarding his labs asap  Signed Lamar Blinks, MD  Called him on 2/9- it does look like he may have gout.  His cath went well yesterday- negative per his report Will rx colchicine for him to use for current flare CK is a bit high but he has been exercising and his kidney function is normal  Results for orders placed or performed in visit on 12/25/15  Uric Acid  Result Value Ref Range   Uric Acid, Serum 9.1 (H) 4.0 - 7.8 mg/dL  Basic metabolic panel  Result Value Ref Range   Sodium 137 135 - 146 mmol/L   Potassium 4.2 3.5 - 5.3 mmol/L   Chloride 101 98 - 110 mmol/L   CO2 25 20 - 31 mmol/L   Glucose, Bld 90 65 - 99 mg/dL   BUN 13 7 - 25 mg/dL   Creat 0.89 0.70 - 1.33 mg/dL   Calcium 9.7 8.6 - 10.3 mg/dL  CK  Result Value Ref Range   Total CK 398 (H) 7 - 232 U/L

## 2015-12-25 NOTE — H&P (View-Only) (Signed)
HPI: This is a 60 year old male who is here today for a followup visit regarding  peripheral arterial disease and coronary artery disease. He has known history of coronary artery disease with multiple interventions in the past. He was found to have three-vessel coronary artery disease in 2011 and underwent coronary artery bypass graft surgery.  He quit smoking in 2009.  He has known history of peripheral arterial disease with intervention on both SFAs. He reports no claudication. Over the last month, he has experienced worsening exertional dyspnea and fatigue with occasional substernal chest tightness especially when he goes upstairs. He suffers from chronic back pain which has worsened recently. He has not been able to do much walking because of that. He underwent a pharmacologic nuclear stress test recently which showed a drop in his ejection fraction to 46% with a possible old inferior infarct. This is different from his prior stress test in 2013 which was normal.  No Known Allergies   Current Outpatient Prescriptions on File Prior to Visit  Medication Sig Dispense Refill  . allopurinol (ZYLOPRIM) 100 MG tablet Take 100 mg by mouth daily. Reported on 12/04/2015  0  . aspirin EC 81 MG tablet Take 1 tablet (81 mg total) by mouth daily.    [name] atorvastatin (LIPITOR) 40 MG tablet Take 0.5 tablets (20 mg total) by mouth daily. 90 tablet 2  . clopidogrel (PLAVIX) 75 MG tablet Take 75 mg by mouth daily.    . fish oil-omega-3 fatty acids 1000 MG capsule Take 1 capsule (1 g total) by mouth daily. 60 capsule 1  . gabapentin (NEURONTIN) 300 MG capsule Take 300-600 mg by mouth 3 (three) times daily as needed.   2  . lisinopril (PRINIVIL,ZESTRIL) 5 MG tablet Take 0.5 tablets (2.5 mg total) by mouth daily. 45 tablet 3  . metFORMIN (GLUCOPHAGE) 500 MG tablet Take 1 tablet (500 mg total) by mouth 2 (two) times daily with a meal. 180 tablet 3  . metoprolol tartrate (LOPRESSOR) 25 MG tablet Take 1 tablet (25  mg total) by mouth 2 (two) times daily. 180 tablet 2  . Multiple Vitamin (MULTIVITAMIN) tablet Take 1 tablet by mouth daily. 30 tablet 10  . vitamin B-12 (CYANOCOBALAMIN) 100 MCG tablet Take 100 mcg by mouth daily. Reported on 12/04/2015     No current facility-administered medications on file prior to visit.     Past Medical History  Diagnosis Date  . PAD (peripheral artery disease) ([place])     a. s/p bilat SFA stents;  b. ABIs 11/2012: R 0.99, L 0.86.  [name] Hypertension   . Hyperlipidemia   . Anemia, unspecified   . GERD (gastroesophageal reflux disease)   . Helicobacter pylori (H. pylori) infection   . Hemorrhoids   . Impotence of organic origin   . Bell's palsy   . CAD (coronary artery disease)     a. s/p multiple PCIs;  b. s/p CABG in 09/2010 (LIMA-LAD, SVG-OM1, SVG-distal RCA/OM2);  c.  Myoview 05/2012: EF 54%, no ischemia, no scar.   . Blood transfusion     during treatment for Ca  . DJD (degenerative joint disease)     low back & all over   . Pancreatic cancer [facility])     surgery 2009  . Myocardial infarction ([place])     2005 and 2012   . Chronic back pain   . Blood transfusion without reported diagnosis   . Pre-diabetes 08/16/2015     Past Surgical History  Procedure Laterality  Date  . Pancreatic  cancer  05/10/2008    Resection of distal panrease and spleen  . Splenectomy    . Back surgery      1992  . Peripheral vascular catheterization  Oct 2014    Lt SFA PTA  . Hemorrhoid surgery  10/30/2011    Procedure: HEMORRHOIDECTOMY;  Surgeon: Harl Bowie, MD;  Location: [place];  Service: General;  Laterality: N/A;  . Peripheral vascular catheterization  Oct 2015    ISR Lt SFA-PTA  . Abdominal aortagram N/A 09/13/2013    Procedure: ABDOMINAL AORTAGRAM;  Surgeon: Wellington Hampshire, MD;  Location: [facility] CATH LAB;  Service: Cardiovascular;  Laterality: N/A;  . Thrombectomy femoral artery Left 09/13/2013    Procedure: THROMBECTOMY FEMORAL ARTERY;  Surgeon: Wellington Hampshire, MD;   Location: [place] CATH LAB;  Service: Cardiovascular;  Laterality: Left;  . Abdominal aortagram N/A 08/29/2014    Procedure: ABDOMINAL AORTAGRAM;  Surgeon: Wellington Hampshire, MD;  Location: [facility] CATH LAB;  Service: Cardiovascular;  Laterality: N/A;  . Coronary artery bypass graft  nov 2011    x 4  . Partial knee arthroplasty Right 11/19/2014    Procedure: RIGHT UNI-KNEE ARTHROPLASTY MEDIALLY;  Surgeon: Mauri Pole, MD;  Location: [facility];  Service: Orthopedics;  Laterality: Right;  . Right partial knee replacement       Family History  Problem Relation Age of Onset  . Heart attack Father     died of MI at age 72  . Cancer Father   . Anesthesia problems Neg Hx   . Hypotension Neg Hx   . Malignant hyperthermia Neg Hx   . Pseudochol deficiency Neg Hx   . Colon cancer Neg Hx   . Esophageal cancer Neg Hx   . Prostate cancer Neg Hx   . Rectal cancer Neg Hx   . Stomach cancer Neg Hx      Social History   Social History  . Marital Status: Single    Spouse Name: N/A  . Number of Children: 0  . Years of Education: N/A   Occupational History  . Diability    Social History Main Topics  . Smoking status: Former Smoker    Types: Cigarettes    Quit date: 11/29/2008  . Smokeless tobacco: Never Used  . Alcohol Use: 1.8 oz/week    3 Shots of liquor per week     Comment: 2 drinks of brandy every week   . Drug Use: No  . Sexual Activity: Not Currently   Other Topics Concern  . 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 He appears well-developed and well-nourished. No distress.  HENT: No nasal discharge.  Head: Normocephalic and atraumatic.  Eyes: Pupils are equal and round. Right eye exhibits no discharge. Left eye  exhibits no discharge.  Neck: Normal range of motion. Neck supple. No JVD present. No thyromegaly present.  Cardiovascular: Normal rate, regular rhythm, normal heart sounds and. Exam reveals no gallop and no friction rub. No murmur heard.  Pulmonary/Chest: Effort normal and breath sounds normal. No stridor. No respiratory distress. He has no wheezes. He has no  rales. He exhibits no tenderness.  Abdominal: Soft. Bowel sounds are normal. He exhibits no distension. There is no tenderness. There is no rebound and no guarding.  Musculoskeletal: Normal range of motion. He exhibits no edema and no tenderness.  Neurological: He is alert and oriented to person, place, and time. Coordination normal.  Skin: Skin is warm and dry. No rash noted. He is not diaphoretic. No erythema. No pallor.  Psychiatric: He has a normal mood and affect. His behavior is normal. Judgment and thought content normal.  Vascular: Femoral pulses are slightly diminished bilaterally. PT: +1 in the right side and +1 on the left side, DP: + 1 On the right side and 0 on the left side. Radial pulses normal bilaterally.      ASSESSMENT AND PLAN

## 2015-12-26 ENCOUNTER — Encounter (HOSPITAL_COMMUNITY): Payer: Self-pay | Admitting: Cardiovascular Disease

## 2015-12-26 MED ORDER — COLCHICINE 0.6 MG PO TABS: ORAL_TABLET | ORAL | Status: DC

## 2015-12-26 NOTE — Addendum Note (Signed)
Addended by: Lamar Blinks C on: 12/26/2015 03:00 PM   Modules accepted: Orders

## 2016-01-06 ENCOUNTER — Other Ambulatory Visit: Payer: Self-pay | Admitting: Family Medicine

## 2016-01-06 NOTE — Telephone Encounter (Signed)
Pt needs a refill on the hydrocodone and colchichine.  [phone]

## 2016-01-07 ENCOUNTER — Ambulatory Visit (INDEPENDENT_AMBULATORY_CARE_PROVIDER_SITE_OTHER): Payer: Medicare Other | Admitting: Cardiovascular Disease

## 2016-01-07 ENCOUNTER — Encounter: Payer: Self-pay | Admitting: Cardiovascular Disease

## 2016-01-07 VITALS — BP 118/80 | HR 62 | Ht 69.0 in | Wt 217.0 lb

## 2016-01-07 DIAGNOSIS — E785 Hyperlipidemia, unspecified: Secondary | ICD-10-CM | POA: Diagnosis not present

## 2016-01-07 DIAGNOSIS — I10 Essential (primary) hypertension: Secondary | ICD-10-CM

## 2016-01-07 DIAGNOSIS — I25709 Atherosclerosis of coronary artery bypass graft(s), unspecified, with unspecified angina pectoris: Secondary | ICD-10-CM | POA: Diagnosis not present

## 2016-01-07 DIAGNOSIS — I739 Peripheral vascular disease, unspecified: Secondary | ICD-10-CM | POA: Diagnosis not present

## 2016-01-07 MED ORDER — ISOSORBIDE MONONITRATE ER 30 MG PO TB24: 30.0000 mg | ORAL_TABLET | Freq: Every day | ORAL | Status: DC

## 2016-01-07 NOTE — Assessment & Plan Note (Signed)
Lab Results  Component Value Date   CHOL 167 12/04/2015   HDL 48 12/04/2015   LDLCALC 83 12/04/2015   LDLDIRECT 80 11/06/2013   TRIG 181* 12/04/2015   CHOLHDL 3.5 12/04/2015   Continue treatment with atorvastatin. His LDL was 83 which is close to 70.

## 2016-01-07 NOTE — Patient Instructions (Signed)
Medication Instructions:  Your physician has recommended you make the following change in your medication:  1. START Isosorbide MN '30mg'$  take one tablet by mouth daily  Labwork: No new orders.   Testing/Procedures: No new orders.   Follow-Up: Your physician wants you to follow-up in: 6 MONTHS with Dr Fletcher Anon.  You will receive a reminder letter in the mail two months in advance. If you don't receive a letter, please call our office to schedule the follow-up appointment.   Any Other Special Instructions Will Be Listed Below (If Applicable).     If you need a refill on your cardiac medications before your next appointment, please call your pharmacy.

## 2016-01-07 NOTE — Assessment & Plan Note (Signed)
He currently has no claudication. Continue medical therapy.

## 2016-01-07 NOTE — Progress Notes (Signed)
HPI: This is a 60 year old male who is here today for a followup visit regarding  peripheral arterial disease and coronary artery disease. He has known history of coronary artery disease with multiple interventions in the past. He was found to have three-vessel coronary artery disease in 2011 and underwent coronary artery bypass graft surgery.  He quit smoking in 2009.  He has known history of peripheral arterial disease with intervention on both SFAs. He reports no claudication but does have are from peripheral neuropathy and plantar fasciitis on the right side. He was seen recently for exertional chest pain and shortness of breath. He underwent a pharmacologic nuclear stress test recently which showed a drop in his ejection fraction to 46% with a possible old inferior infarct.  I proceeded with cardiac catheterization via the left radial artery which showed mild nonobstructive LAD disease, occluded mid left circumflex and occluded proximal right coronary artery with left-to-right collaterals. SVG to RCA was occluded and LIMA to LAD was atretic. SVG to OM was normal. Abnormal stress test was due to chronically occluded right coronary artery and graft with left-to-right collaterals. His native RCA was not favorable for CTO PCI. I recommended medical therapy. He reports slight improvement in symptoms.  No Known Allergies   Current Outpatient Prescriptions on File Prior to Visit  Medication Sig Dispense Refill  . aspirin EC 81 MG tablet Take 1 tablet (81 mg total) by mouth daily.    [name] atorvastatin (LIPITOR) 40 MG tablet Take 0.5 tablets (20 mg total) by mouth daily. 90 tablet 2  . clopidogrel (PLAVIX) 75 MG tablet Take 75 mg by mouth daily.    . cyclobenzaprine (FLEXERIL) 10 MG tablet Take 5-10 mg by mouth 3 (three) times daily as needed for muscle spasms.   3  . fish oil-omega-3 fatty acids 1000 MG capsule Take 1 capsule (1 g total) by mouth daily. 60 capsule 1  . gabapentin (NEURONTIN) 300 MG  capsule Take 600 mg by mouth 3 (three) times daily.   2  . lisinopril (PRINIVIL,ZESTRIL) 5 MG tablet Take 0.5 tablets (2.5 mg total) by mouth daily. 45 tablet 3  . metFORMIN (GLUCOPHAGE) 500 MG tablet Take 1 tablet (500 mg total) by mouth 2 (two) times daily with a meal. 180 tablet 3  . metoprolol tartrate (LOPRESSOR) 25 MG tablet Take 1 tablet (25 mg total) by mouth 2 (two) times daily. 180 tablet 2  . Multiple Vitamin (MULTIVITAMIN) tablet Take 1 tablet by mouth daily. 30 tablet 10   No current facility-administered medications on file prior to visit.     Past Medical History  Diagnosis Date  . PAD (peripheral artery disease) ([place])     a. s/p bilat SFA stents;  b. ABIs 11/2012: R 0.99, L 0.86.  [name] Hypertension   . Hyperlipidemia   . Anemia, unspecified   . GERD (gastroesophageal reflux disease)   . Helicobacter pylori (H. pylori) infection   . Hemorrhoids   . Impotence of organic origin   . Bell's palsy   . CAD (coronary artery disease)     a. s/p multiple PCIs;  b. s/p CABG in 09/2010 (LIMA-LAD, SVG-OM1, SVG-distal RCA/OM2);  c.  Myoview 05/2012: EF 54%, no ischemia, no scar.   . Blood transfusion     during treatment for Ca  . DJD (degenerative joint disease)     low back & all over   . Pancreatic cancer [facility])     surgery 2009  . Myocardial infarction ([place])  2005 and 2012   . Chronic back pain   . Blood transfusion without reported diagnosis   . Pre-diabetes 08/16/2015     Past Surgical History  Procedure Laterality Date  . Pancreatic  cancer  05/10/2008    Resection of distal panrease and spleen  . Splenectomy    . Back surgery      1992  . Peripheral vascular catheterization  Oct 2014    Lt SFA PTA  . Hemorrhoid surgery  10/30/2011    Procedure: HEMORRHOIDECTOMY;  Surgeon: Harl Bowie, MD;  Location: [place];  Service: General;  Laterality: N/A;  . Peripheral vascular catheterization  Oct 2015    ISR Lt SFA-PTA  . Abdominal aortagram N/A 09/13/2013     Procedure: ABDOMINAL AORTAGRAM;  Surgeon: Wellington Hampshire, MD;  Location: [facility] CATH LAB;  Service: Cardiovascular;  Laterality: N/A;  . Thrombectomy femoral artery Left 09/13/2013    Procedure: THROMBECTOMY FEMORAL ARTERY;  Surgeon: Wellington Hampshire, MD;  Location: [place] CATH LAB;  Service: Cardiovascular;  Laterality: Left;  . Abdominal aortagram N/A 08/29/2014    Procedure: ABDOMINAL AORTAGRAM;  Surgeon: Wellington Hampshire, MD;  Location: [facility] CATH LAB;  Service: Cardiovascular;  Laterality: N/A;  . Coronary artery bypass graft  nov 2011    x 4  . Partial knee arthroplasty Right 11/19/2014    Procedure: RIGHT UNI-KNEE ARTHROPLASTY MEDIALLY;  Surgeon: Mauri Pole, MD;  [facility];  Service: Orthopedics;  Laterality: Right;  . Right partial knee replacement    . Cardiac catheterization N/A 12/25/2015    Procedure: Left Heart Cath and Cors/Grafts Angiography;  Surgeon: Wellington Hampshire, MD;  Location: [place] CV LAB;  Service: Cardiovascular;  Laterality: N/A;     Family History  Problem Relation Age of Onset  . Heart attack Father     died of MI at age 56  . Cancer Father   . Anesthesia problems Neg Hx   . Hypotension Neg Hx   . Malignant hyperthermia Neg Hx   . Pseudochol deficiency Neg Hx   . Colon cancer Neg Hx   . Esophageal cancer Neg Hx   . Prostate cancer Neg Hx   . Rectal cancer Neg Hx   . Stomach cancer Neg Hx      Social History   Social History  . Marital Status: Single    Spouse Name: N/A  . Number of Children: 0  . Years of Education: N/A   Occupational History  . Diability    Social History Main Topics  . Smoking status: Former Smoker    Types: Cigarettes    Quit date: 11/29/2008  . Smokeless tobacco: Never Used  . Alcohol Use: 1.8 oz/week    3 Shots of liquor per week     Comment: 2 drinks of brandy every week   . Drug Use: No  . Sexual Activity: Not Currently   Other Topics Concern  . Not on file   Social History Narrative   He works at the  night shift at [employer] part time   [employer] [phone]   Single, no children     PHYSICAL EXAM   BP 118/80 mmHg  Pulse 62  Ht '5\' 9"'$  (1.753 m)  Wt 217 lb (98.431 kg)  BMI 32.03 kg/m2  Constitutional: He is oriented to person, place, and time. He appears well-developed and well-nourished. No distress.  HENT: No nasal discharge.  Head: Normocephalic and atraumatic.  Eyes: Pupils are equal and  round. Right eye exhibits no discharge. Left eye exhibits no discharge.  Neck: Normal range of motion. Neck supple. No JVD present. No thyromegaly present.  Cardiovascular: Normal rate, regular rhythm, normal heart sounds and. Exam reveals no gallop and no friction rub. No murmur heard.  Pulmonary/Chest: Effort normal and breath sounds normal. No stridor. No respiratory distress. He has no wheezes. He has no rales. He exhibits no tenderness.  Abdominal: Soft. Bowel sounds are normal. He exhibits no distension. There is no tenderness. There is no rebound and no guarding.  Musculoskeletal: Normal range of motion. He exhibits no edema and no tenderness.  Neurological: He is alert and oriented to person, place, and time. Coordination normal.  Skin: Skin is warm and dry. No rash noted. He is not diaphoretic. No erythema. No pallor.  Psychiatric: He has a normal mood and affect. His behavior is normal. Judgment and thought content normal.  Vascular: Femoral pulses are slightly diminished bilaterally. PT: +1 in the right side and +1 on the left side, DP: + 1 On the right side and 0 on the left side. Radial pulses normal bilaterally.  no hematoma.     ASSESSMENT AND PLAN

## 2016-01-07 NOTE — Assessment & Plan Note (Signed)
The patient's symptoms are suggestive of class II angina due to chronically occluded right coronary artery with left-to-right collaterals. I recommend aggressive medical therapy. Given his symptoms, I elected to add a small dose Imdur 30 mg once daily. I discussed side effects and not to take any phosphodiesterase inhibitors for erectile dysfunction while he is on nitroglycerin.

## 2016-01-07 NOTE — Assessment & Plan Note (Signed)
Blood pressure is well controlled on current medications. 

## 2016-01-08 NOTE — Telephone Encounter (Signed)
Called and LMOM- I refilled the colchicine.  Need to talk about the hydrocodone if needed because I cannot call this in and am at a new office.  Will try him back to figure out plan

## 2016-01-09 NOTE — Telephone Encounter (Signed)
Called him- his gout is still painful. He would like more hydrocodone ( I gave him #12 on 12/25/15)   I will see if one of my colleagues at [facility] would be able to refill this for him- sent an email to the PA pool

## 2016-01-10 ENCOUNTER — Telehealth: Payer: Self-pay | Admitting: Urgent Care

## 2016-01-10 MED ORDER — HYDROCODONE-ACETAMINOPHEN 5-325 MG PO TABS: 1.0000 | ORAL_TABLET | Freq: Four times a day (QID) | ORAL | Status: DC | PRN

## 2016-01-10 NOTE — Telephone Encounter (Signed)
I filled the script for patient, he is aware.

## 2016-01-10 NOTE — Telephone Encounter (Signed)
-----   Message from Darreld Mclean, MD sent at 01/09/2016  8:38 AM EST ----- Hello!  I was wondering if one of you would be able to rx this pt a small supply of hydrocodone for gout pain. I gave him #12 on the 8th, and he states that his gout pains have returned.  I refilled his colchicine but he does not want to drive out here to get a hydrocodone rx.  Please let me know - this is a new situation for all of [place]!   Thanks!  [place]

## 2016-01-13 MED FILL — HYDROCODON-APAP 5-325: 5-325 | 3 days supply | Qty: 12 | Fill #0

## 2016-02-20 ENCOUNTER — Ambulatory Visit (INDEPENDENT_AMBULATORY_CARE_PROVIDER_SITE_OTHER): Payer: Medicare Other | Admitting: Family Medicine

## 2016-02-20 VITALS — BP 109/69 | HR 85 | Temp 98.4°F | Resp 16 | Ht 69.5 in | Wt 216.0 lb

## 2016-02-20 DIAGNOSIS — E349 Endocrine disorder, unspecified: Secondary | ICD-10-CM

## 2016-02-20 DIAGNOSIS — E291 Testicular hypofunction: Secondary | ICD-10-CM | POA: Diagnosis not present

## 2016-02-20 DIAGNOSIS — I25709 Atherosclerosis of coronary artery bypass graft(s), unspecified, with unspecified angina pectoris: Secondary | ICD-10-CM | POA: Diagnosis not present

## 2016-02-20 DIAGNOSIS — R7303 Prediabetes: Secondary | ICD-10-CM | POA: Diagnosis not present

## 2016-02-20 DIAGNOSIS — G6289 Other specified polyneuropathies: Secondary | ICD-10-CM

## 2016-02-20 DIAGNOSIS — I739 Peripheral vascular disease, unspecified: Secondary | ICD-10-CM

## 2016-02-20 DIAGNOSIS — R7302 Impaired glucose tolerance (oral): Secondary | ICD-10-CM

## 2016-02-20 LAB — GLUCOSE, POCT (MANUAL RESULT ENTRY): POC GLUCOSE: 121 mg/dL — AB (ref 70–99)

## 2016-02-20 LAB — POCT GLYCOSYLATED HEMOGLOBIN (HGB A1C): HEMOGLOBIN A1C: 6

## 2016-02-20 MED ORDER — BLOOD GLUCOSE MONITOR KIT: PACK | Status: DC

## 2016-02-20 NOTE — Patient Instructions (Addendum)
IF you received an x-ray today, you will receive an invoice from [facility]. Please contact [facility] at [phone] with questions or concerns regarding your invoice.   IF you received labwork today, you will receive an invoice from [employer]. Please contact [facility] at [phone] with questions or concerns regarding your invoice.   Our billing staff will not be able to assist you with questions regarding bills from these companies.  You will be contacted with the lab results as soon as they are available. The fastest way to get your results is to activate your My Chart account. Instructions are located on the last page of this paperwork. If you have not heard from [place] regarding the results in 2 weeks, please contact this office.   Hypoglycemia Low blood sugar (hypoglycemia) means that the level of sugar in your blood is lower than it should be. Signs of low blood sugar include:  Getting sweaty.  Feeling hungry.  Feeling dizzy or weak.  Feeling sleepier than normal.  Feeling nervous.  Headaches.  Having a fast heartbeat. Low blood sugar can happen fast and can be an emergency. Your doctor can do tests to check your blood sugar level. You can have low blood sugar and not have diabetes. HOME CARE  Check your blood sugar as told by your doctor. If it is less than 70 mg/dl or as told by your doctor, take 1 of the following:  3 to 4 glucose tablets.   cup clear juice.   cup soda pop, not diet.  1 cup milk.  5 to 6 hard candies.  Recheck blood sugar after 15 minutes. Repeat until it is at the right level.  Eat a snack if it is more than 1 hour until the next meal.  Only take medicine as told by your doctor.  Do not skip meals. Eat on time.  Do not drink alcohol except with meals.  Check your blood glucose before driving.  Check your blood glucose before and after exercise.  Always carry treatment with you,  such as glucose pills.  Always wear a medical alert bracelet if you have diabetes. GET HELP RIGHT AWAY IF:   Your blood glucose goes below 70 mg/dl or as told by your doctor, and you:  Are confused.  Are not able to swallow.  Pass out (faint).  You cannot treat yourself. You may need someone to help you.  You have low blood sugar problems often.  You have problems from your medicines.  You are not feeling better after 3 to 4 days.  You have vision changes. MAKE SURE YOU:   Understand these instructions.  Will watch this condition.  Will get help right away if you are not doing well or get worse.   This information is not intended to replace advice given to you by your health care provider. Make sure you discuss any questions you have with your health care provider.   Document Released: 01/27/2010 Document Revised: 11/23/2014 Document Reviewed: 07/09/2015 Elsevier Interactive Patient Education 2016 Elsevier Inc.  Blood Glucose Monitoring, Adult Monitoring your blood glucose (also know as blood sugar) helps you to manage your diabetes. It also helps you and your health care provider monitor your diabetes and determine how well your treatment plan is working. WHY SHOULD YOU MONITOR YOUR BLOOD GLUCOSE?  It can help you understand how food, exercise, and medicine affect your blood glucose.  It allows you to know what your blood glucose is  at any given moment. You can quickly tell if you are having low blood glucose (hypoglycemia) or high blood glucose (hyperglycemia).  It can help you and your health care provider know how to adjust your medicines.  It can help you understand how to manage an illness or adjust medicine for exercise. WHEN SHOULD YOU TEST? Your health care provider will help you decide how often you should check your blood glucose. This may depend on the type of diabetes you have, your diabetes control, or the types of medicines you are taking. Be sure to  write down all of your blood glucose readings so that this information can be reviewed with your health care provider. See below for examples of testing times that your health care provider may suggest. Type 1 Diabetes  Test at least 2 times per day if your diabetes is well controlled, if you are using an insulin pump, or if you perform multiple daily injections.  If your diabetes is not well controlled or if you are sick, you may need to test more often.  It is a good idea to also test:  Before every insulin injection.  Before and after exercise.  Between meals and 2 hours after a meal.  Occasionally between 2:00 a.m. and 3:00 a.m. Type 2 Diabetes  If you are taking insulin, test at least 2 times per day. However, it is best to test before every insulin injection.  If you take medicines by mouth (orally), test 2 times a day.  If you are on a controlled diet, test once a day.  If your diabetes is not well controlled or if you are sick, you may need to monitor more often. HOW TO MONITOR YOUR BLOOD GLUCOSE Supplies Needed  Blood glucose meter.  Test strips for your meter. Each meter has its own strips. You must use the strips that go with your own meter.  A pricking needle (lancet).  A device that holds the lancet (lancing device).  A journal or log book to write down your results. Procedure  Wash your hands with soap and water. Alcohol is not preferred.  Prick the side of your finger (not the tip) with the lancet.  Gently milk the finger until a small drop of blood appears.  Follow the instructions that come with your meter for inserting the test strip, applying blood to the strip, and using your blood glucose meter. Other Areas to Get Blood for Testing Some meters allow you to use other areas of your body (other than your finger) to test your blood. These areas are called alternative sites. The most common alternative sites are:  The forearm.  The thigh.  The back  area of the lower leg.  The palm of the hand. The blood flow in these areas is slower. Therefore, the blood glucose values you get may be delayed, and the numbers are different from what you would get from your fingers. Do not use alternative sites if you think you are having hypoglycemia. Your reading will not be accurate. Always use a finger if you are having hypoglycemia. Also, if you cannot feel your lows (hypoglycemia unawareness), always use your fingers for your blood glucose checks. ADDITIONAL TIPS FOR GLUCOSE MONITORING  Do not reuse lancets.  Always carry your supplies with you.  All blood glucose meters have a 24-hour "hotline" number to call if you have questions or need help.  Adjust (calibrate) your blood glucose meter with a control solution after finishing a few boxes  of strips. BLOOD GLUCOSE RECORD KEEPING It is a good idea to keep a daily record or log of your blood glucose readings. Most glucose meters, if not all, keep your glucose records stored in the meter. Some meters come with the ability to download your records to your home computer. Keeping a record of your blood glucose readings is especially helpful if you are wanting to look for patterns. Make notes to go along with the blood glucose readings because you might forget what happened at that exact time. Keeping good records helps you and your health care provider to work together to achieve good diabetes management.    This information is not intended to replace advice given to you by your health care provider. Make sure you discuss any questions you have with your health care provider.   Document Released: 11/05/2003 Document Revised: 11/23/2014 Document Reviewed: 03/27/2013 Elsevier Interactive Patient Education [employer].

## 2016-02-20 NOTE — Progress Notes (Signed)
Subjective:    Patient ID: Steven Kindred., male    DOB: [birth year], 60 y.o.   MRN: [id number]   Chief Complaint  Patient presents with  . Establish Care  . Medication Refill    Metformin  . Labs Only    testosterone level check    HPI  Was previously on testosterone injections for several months but then he had to go off when his insurance wouldn't cover it.  He was previously seeng Dr. Doreene Burke at [place] who started him on the testosterone supplementation. Improved his sex life - libido, ED, energy,   Weight stays stable at [phone] w/ occ bloating Had a pancreatic cyst, spleenectomy, cabg  Did have a colonscopy in Jan  Dr. Fletcher Anon did a cath in Feb, he is prescribing his lipitor and plavix, imdur, lisinopril, metoprolol - no ED prob from this  Is on gabapentin 600 mg tid since his knee replacement   Has been taking metformin 547m bid from Dr. CEdilia Boin Sept 2016 since then.  He has not noticed any GI side effects from that.    Disalbed from massive MI in 2005, 2012.  Past Medical History  Diagnosis Date  . PAD (peripheral artery disease) ([facility]     a. s/p bilat SFA stents;  b. ABIs 11/2012: R 0.99, L 0.86.  .Marland KitchenHypertension   . Hyperlipidemia   . Anemia, unspecified   . GERD (gastroesophageal reflux disease)   . Helicobacter pylori (H. pylori) infection   . Hemorrhoids   . Impotence of organic origin   . Bell's palsy   . Blood transfusion     during treatment for Ca  . DJD (degenerative joint disease)     low back & all over   . Pancreatic cancer ([facility]     surgery 2009  . [place]     [place] 2012   . Chronic back pain   . Blood transfusion without reported diagnosis   . Pre-diabetes 08/16/2015  . CAD (coronary artery disease)     a. s/p multiple PCIs;  b. s/p CABG in 09/2010 (LIMA-LAD, SVG-OM1, SVG-distal RCA/OM2);  Cardiac cath in 12/2015: Mild LAD disease, occluded mid LCX and proximal RCA (left to right collaterals),  Occluded SVG to RCA and atretic LIMA. Patent SVG to OM   Past Surgical History  Procedure Laterality Date  . Pancreatic  cancer  05/10/2008    Resection of distal panrease and spleen  . Splenectomy    . Back surgery      1992  . Peripheral vascular catheterization  Oct 2014    Lt SFA PTA  . Hemorrhoid surgery  10/30/2011    Procedure: HEMORRHOIDECTOMY;  Surgeon: DHarl Bowie MD;  [place] [facility]  Service: General;  Laterality: N/A;  . Peripheral vascular catheterization  Oct 2015    ISR Lt SFA-PTA  . Abdominal aortagram N/A 09/13/2013    Procedure: ABDOMINAL AORTAGRAM;  Surgeon: MWellington Hampshire MD;  [facility];  Service: Cardiovascular;  Laterality: N/A;  . Thrombectomy femoral artery Left 09/13/2013    Procedure: THROMBECTOMY FEMORAL ARTERY;  Surgeon: MWellington Hampshire MD;  [facility];  Service: Cardiovascular;  Laterality: Left;  . Abdominal aortagram N/A 08/29/2014    Procedure: ABDOMINAL AORTAGRAM;  Surgeon: MWellington Hampshire MD;  [facility];  Service: Cardiovascular;  Laterality: N/A;  . Coronary artery bypass graft  nov 2011    x 4  . Partial  knee arthroplasty Right 11/19/2014    Procedure: RIGHT UNI-KNEE ARTHROPLASTY MEDIALLY;  Surgeon: Mauri Pole, MD;  [facility];  Service: Orthopedics;  Laterality: Right;  . Right partial knee replacement    . Cardiac catheterization N/A 12/25/2015    Procedure: Left Heart Cath and Cors/Grafts Angiography;  Surgeon: Wellington Hampshire, MD;  Location: [place] CV LAB;  Service: Cardiovascular;  Laterality: N/A;   Current Outpatient Prescriptions on File Prior to Visit  Medication Sig Dispense Refill  . aspirin EC 81 MG tablet Take 1 tablet (81 mg total) by mouth daily.    [name] atorvastatin (LIPITOR) 40 MG tablet Take 0.5 tablets (20 mg total) by mouth daily. 90 tablet 2  . clopidogrel (PLAVIX) 75 MG tablet Take 75 mg by mouth daily.    . fish oil-omega-3 fatty acids 1000 MG capsule Take 1 capsule  (1 g total) by mouth daily. 60 capsule 1  . gabapentin (NEURONTIN) 300 MG capsule Take 600 mg by mouth 3 (three) times daily.   2  . isosorbide mononitrate (IMDUR) 30 MG 24 hr tablet Take 1 tablet (30 mg total) by mouth daily. 90 tablet 3  . lisinopril (PRINIVIL,ZESTRIL) 5 MG tablet Take 0.5 tablets (2.5 mg total) by mouth daily. 45 tablet 3  . metFORMIN (GLUCOPHAGE) 500 MG tablet Take 1 tablet (500 mg total) by mouth 2 (two) times daily with a meal. 180 tablet 3  . metoprolol tartrate (LOPRESSOR) 25 MG tablet Take 1 tablet (25 mg total) by mouth 2 (two) times daily. 180 tablet 2  . Multiple Vitamin (MULTIVITAMIN) tablet Take 1 tablet by mouth daily. 30 tablet 10   No current facility-administered medications on file prior to visit.   No Known Allergies Family History  Problem Relation Age of Onset  . Heart attack Father     died of MI at age 80  . Cancer Father   . Anesthesia problems Neg Hx   . Hypotension Neg Hx   . Malignant hyperthermia Neg Hx   . Pseudochol deficiency Neg Hx   . Colon cancer Neg Hx   . Esophageal cancer Neg Hx   . Prostate cancer Neg Hx   . Rectal cancer Neg Hx   . Stomach cancer Neg Hx    Social History   Social History  . Marital Status: Single    Spouse Name: N/A  . Number of Children: 0  . Years of Education: N/A   Occupational History  . Diability    Social History Main Topics  . Smoking status: Former Smoker    Types: Cigarettes    Quit date: 11/29/2008  . Smokeless tobacco: Never Used  . Alcohol Use: 1.8 oz/week    3 Shots of liquor per week     Comment: 2 drinks of brandy every week   . Drug Use: No  . Sexual Activity: Not Currently   Other Topics Concern  . 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 Negative for fever, chills, activity change and appetite change.  Eyes:  Negative for visual disturbance.  Respiratory: Negative for shortness of breath.   Cardiovascular: Negative for chest pain and leg swelling.  Gastrointestinal: Positive for abdominal distention. Negative for nausea, vomiting, abdominal pain, diarrhea and constipation.  Endocrine: Negative for polydipsia and polyphagia.  Genitourinary:  Decreased libido  Musculoskeletal: Positive for myalgias, back pain, joint swelling and arthralgias.  Neurological: Negative for dizziness, syncope, facial asymmetry, weakness, light-headedness and headaches.       Objective:  BP 109/69 mmHg  Pulse 85  Temp(Src) 98.4 F (36.9 C)  Resp 16  Ht 5' 9.5" (1.765 m)  Wt 216 lb (97.977 kg)  BMI 31.45 kg/m2  Physical Exam  Constitutional: He is oriented to person, place, and time. He appears well-developed and well-nourished. No distress.  HENT:  Head: Normocephalic and atraumatic.  Eyes: Conjunctivae are normal. Pupils are equal, round, and reactive to light. No scleral icterus.  Neck: Normal range of motion. Neck supple. No thyromegaly present.  Cardiovascular: Normal rate, regular rhythm, normal heart sounds and intact distal pulses.   Pulmonary/Chest: Effort normal and breath sounds normal. No respiratory distress.  Musculoskeletal: He exhibits no edema.  Lymphadenopathy:    He has no cervical adenopathy.  Neurological: He is alert and oriented to person, place, and time.  Skin: Skin is warm and dry. He is not diaphoretic.  Psychiatric: He has a normal mood and affect. His behavior is normal.      Results for orders placed or performed in visit on 02/20/16  POCT glucose (manual entry)  Result Value Ref Range   POC Glucose 121 (A) 70 - 99 mg/dl  POCT glycosylated hemoglobin (Hb A1C)  Result Value Ref Range   Hemoglobin A1C 6.0     Assessment & Plan:   1. Pre-diabetes   2. Testosterone deficiency - refer to urology for management since he has increased risk of cardiac complications due to  his strong hx of CAD - however, pt is willing to accept that risk as he does feel sig better on HRT.  3. Coronary artery disease involving coronary bypass graft of native heart with unspecified angina pectoris   4. PAD (peripheral artery disease) (HCC)   5. Other polyneuropathy ([place])   6. Glucose intolerance (impaired glucose tolerance)   7. Hypercalcemia   Connie spent sig time teaching pt how to check a cbg.  Orders Placed This Encounter  Procedures  . Ambulatory referral to Urology    Referral Priority:  Routine    Referral Type:  Consultation    Referral Reason:  Specialty Services Required    Requested Specialty:  Urology    Number of Visits Requested:  1  . POCT glucose (manual entry)  . POCT glycosylated hemoglobin (Hb A1C)  . HM Diabetes Foot Exam    Meds ordered this encounter  Medications  . blood glucose meter kit and supplies KIT    Sig: Dispense based on patient and insurance preference. Use up to twice daily as directed. (FOR ICD-10 R73.02, R73.03)    Dispense:  1 each    Refill:  0    Order Specific Question:  Number of strips    Answer:  1000    Order Specific Question:  Number of lancets    Answer:  1000     Steven Cheadle, MD MPH

## 2016-03-10 MED FILL — metFORMIN HCL 500 MG TABS: 500 | 90 days supply | Qty: 180 | Fill #2

## 2016-04-13 ENCOUNTER — Other Ambulatory Visit: Payer: Self-pay | Admitting: Cardiovascular Disease

## 2016-04-14 NOTE — Telephone Encounter (Signed)
Review for refill, Thank you. 

## 2016-05-20 ENCOUNTER — Ambulatory Visit (INDEPENDENT_AMBULATORY_CARE_PROVIDER_SITE_OTHER): Payer: Medicare Other | Admitting: Physician Assistant

## 2016-05-20 VITALS — BP 116/78 | HR 70 | Temp 98.5°F | Resp 16 | Ht 69.0 in | Wt 208.8 lb

## 2016-05-20 DIAGNOSIS — E119 Type 2 diabetes mellitus without complications: Secondary | ICD-10-CM | POA: Diagnosis not present

## 2016-05-20 DIAGNOSIS — Z760 Encounter for issue of repeat prescription: Secondary | ICD-10-CM

## 2016-05-20 LAB — CREATININE WITH EST GFR
Creat: 0.97 mg/dL (ref 0.70–1.25)
GFR, EST NON AFRICAN AMERICAN: 84 mL/min (ref >=60)

## 2016-05-20 LAB — POC MICROSCOPIC URINALYSIS (UMFC): Mucus: ABSENT

## 2016-05-20 LAB — POCT GLYCOSYLATED HEMOGLOBIN (HGB A1C): HEMOGLOBIN A1C: 6.2

## 2016-05-20 NOTE — Patient Instructions (Signed)
     IF you received an x-ray today, you will receive an invoice from [facility]. Please contact [facility] at [phone] with questions or concerns regarding your invoice.   IF you received labwork today, you will receive an invoice from [facility]. Please contact [facility] at [phone] with questions or concerns regarding your invoice.   Our billing staff will not be able to assist you with questions regarding bills from these companies.  You will be contacted with the lab results as soon as they are available. The fastest way to get your results is to activate your My Chart account. Instructions are located on the last page of this paperwork. If you have not heard from us regarding the results in 2 weeks, please contact this office.      

## 2016-05-20 NOTE — Progress Notes (Signed)
05/20/2016 3:19 PM   DOB: June 16, 1956 / MRN: [id number]  SUBJECTIVE:  Steven Hale. is a 60 y.o. male presenting for a follow up of diabetes.  He is a former patient of Dr. Lorelei Hale and is here today to establish care with me. His last visit to the eye doctor was 6 months ago and he was given a good report.     Immunization History  Administered Date(s) Administered  . Influenza Split 08/30/2013  . Influenza Whole 08/22/2008, 09/23/2009  . Influenza,inj,Quad PF,36+ Mos 08/13/2015  . Pneumococcal Conjugate-13 12/10/2014  . Td 04/16/2008     He has No Known Allergies.   He  has a past medical history of PAD (peripheral artery disease) ([place]); Hypertension; Hyperlipidemia; Anemia, unspecified; GERD (gastroesophageal reflux disease); Helicobacter pylori (H. pylori) infection; Hemorrhoids; Impotence of organic origin; Bell's palsy; Blood transfusion; DJD (degenerative joint disease); Pancreatic cancer ([place]); Myocardial infarction ([place]); Chronic back pain; Blood transfusion without reported diagnosis; Pre-diabetes (08/16/2015); and CAD (coronary artery disease).    He  reports that he quit smoking about 7 years ago. His smoking use included Cigarettes. He has never used smokeless tobacco. He reports that he drinks about 1.8 oz of alcohol per week. He reports that he does not use illicit drugs. He  reports that he does not currently engage in sexual activity. The patient  has past surgical history that includes pancreatic  cancer (05/10/2008); splenectomy; Back surgery; Cardiac catheterization (Oct 2014); Hemorrhoid surgery (10/30/2011); Cardiac catheterization (Oct 2015); abdominal aortagram (N/A, 09/13/2013); Thrombectomy femoral artery (Left, 09/13/2013); abdominal aortagram (N/A, 08/29/2014); Coronary artery bypass graft (nov 2011); Partial knee arthroplasty (Right, 11/19/2014); right partial knee replacement; and Cardiac catheterization (N/A, 12/25/2015).  His family history includes Cancer in his  father; Heart attack in his father. There is no history of Anesthesia problems, Hypotension, Malignant hyperthermia, Pseudochol deficiency, Colon cancer, Esophageal cancer, Prostate cancer, Rectal cancer, or Stomach cancer.  Review of Systems  Constitutional: Negative for fever and chills.  Eyes: Negative for blurred vision.  Respiratory: Negative for cough and shortness of breath.   Cardiovascular: Negative for chest pain.  Gastrointestinal: Negative for nausea and abdominal pain.  Genitourinary: Negative for dysuria, urgency and frequency.  Musculoskeletal: Negative for myalgias.  Skin: Negative for rash.  Neurological: Negative for dizziness, tingling and headaches.  Psychiatric/Behavioral: Negative for depression. The patient is not nervous/anxious.     Problem list and medications reviewed and updated by myself where necessary, and exist elsewhere in the encounter.   OBJECTIVE:  BP 116/78 mmHg  Pulse 70  Temp(Src) 98.5 F (36.9 C) (Oral)  Resp 16  Ht '5\' 9"'$  (1.753 m)  Wt 208 lb 12.8 oz (94.711 kg)  BMI 30.82 kg/m2  SpO2 96%  Physical Exam  Constitutional: He is oriented to person, place, and time. He appears well-developed. He does not appear ill.  Eyes: Conjunctivae and EOM are normal. Pupils are equal, round, and reactive to light.  Cardiovascular: Normal rate.   Pulmonary/Chest: Effort normal. He has no wheezes.  Abdominal: He exhibits no distension.  Musculoskeletal: Normal range of motion.  Neurological: He is alert and oriented to person, place, and time. No cranial nerve deficit. Coordination normal.  Skin: Skin is warm and dry. He is not diaphoretic.  Psychiatric: He has a normal mood and affect.  Nursing note and vitals reviewed.   Results for orders placed or performed in visit on 05/20/16  POCT glycosylated hemoglobin (Hb A1C)  Result Value Ref Range   Hemoglobin A1C 6.2  POCT Microscopic Urinalysis ([facility])  Result Value Ref Range   WBC,UR,HPF,POC None  None WBC/hpf   RBC,UR,HPF,POC None None RBC/hpf   Bacteria None None, Too numerous to count   Mucus Absent Absent   Epithelial Cells, UR Per Microscopy None None, Too numerous to count cells/hpf   Diabetic Foot Exam - Simple   Simple Foot Form  Visual Inspection  Sensation Testing  Pulse Check  Comments  Pedal pulses felt on foot exam       No results found.  ASSESSMENT AND PLAN  [name] was seen today for establish care.  Diagnoses and all orders for this visit:  Type 2 diabetes mellitus without complication, without long-term current use of insulin ([place]):  He is in very good control.  Eye exam current. Immunizations up to date.  Will see him back in 6 months for recheck.  I am happy to fill any chronic medications until that time.   -     POCT glycosylated hemoglobin (Hb A1C) -     POCT Microscopic Urinalysis ([facility]) -     Creatinine with Est GFR -     HM Diabetes Foot Exam         The patient was advised to call or return to clinic if he does not see an improvement in symptoms, or to seek the care of the closest emergency department if he worsens with the above plan.   Steven Hale, MHS, PA-C [facility] 05/20/2016 3:19 PM

## 2016-05-21 ENCOUNTER — Ambulatory Visit: Payer: Self-pay | Admitting: Sports Medicine

## 2016-05-21 LAB — MICROALBUMIN, URINE: Microalb, Ur: 7 mg/dL

## 2016-06-03 ENCOUNTER — Other Ambulatory Visit: Payer: Self-pay

## 2016-06-03 ENCOUNTER — Other Ambulatory Visit: Payer: Self-pay | Admitting: Cardiovascular Disease

## 2016-06-03 DIAGNOSIS — I739 Peripheral vascular disease, unspecified: Secondary | ICD-10-CM

## 2016-06-03 DIAGNOSIS — E7800 Pure hypercholesterolemia, unspecified: Secondary | ICD-10-CM

## 2016-06-03 MED ORDER — ATORVASTATIN CALCIUM 40 MG PO TABS: 20.0000 mg | ORAL_TABLET | Freq: Every day | ORAL | Status: DC

## 2016-06-03 MED ORDER — METOPROLOL TARTRATE 25 MG PO TABS: ORAL_TABLET | ORAL | Status: DC

## 2016-06-03 NOTE — Telephone Encounter (Signed)
Rx(s) sent to pharmacy electronically. Patient of Dr. Stanford Breed (last seen 2015), sees Dr. Fletcher Anon for PV

## 2016-06-03 NOTE — Telephone Encounter (Signed)
Medication refill

## 2016-06-03 NOTE — Telephone Encounter (Signed)
Review for refill, Thank you. 

## 2016-06-04 ENCOUNTER — Telehealth: Payer: Self-pay | Admitting: Cardiology

## 2016-06-04 ENCOUNTER — Telehealth: Payer: Self-pay | Admitting: *Deleted

## 2016-06-04 MED ORDER — LISINOPRIL 5 MG PO TABS: 2.5000 mg | ORAL_TABLET | Freq: Every day | ORAL | Status: DC

## 2016-06-04 MED FILL — LISINOPRIL 5 MG TABLET: 5 | 90 days supply | Qty: 45 | Fill #0

## 2016-06-04 NOTE — Telephone Encounter (Signed)
New message     Patient calling the office for samples of medication:   1.  What medication and dosage are you requesting samples for? Lisinopril '5mg'$   2.  Are you currently out of this medication?  Yes---pt want samples 4-5 pills to last until his mail order pres comes in.  The nurse called in a 30 day supply, he cannot afford that.  Please call

## 2016-06-04 NOTE — Telephone Encounter (Signed)
Received call from check out at [facility] office that pt was in office and needed prescription sent to local pharmacy for lisinopril. Prescription has recently been sent to mail order but he needs sent to [facility] on [address] since mail order will take a week or so to get there.  Appointments have been made for him to see Dr. Fletcher Anon and Dr. Stanford Breed. Will send refill for one month supply.

## 2016-06-04 NOTE — Telephone Encounter (Signed)
Returned call to patient. Patient asking if he could go without his medication for a week until his mail order arrives. Patient said he can get 15 pills from his pharmacy. He wants to know if he should pick those up. Advised pt that if able to go ahead and pick up the rx as he as already been a few days without his lisinopril and it would not be good to stay off of it so long. He verbalized understanding and will go pick up RX tomorrow.

## 2016-06-08 MED FILL — metFORMIN HCL 500 MG TABS: 500 | 90 days supply | Qty: 180 | Fill #3

## 2016-07-07 ENCOUNTER — Ambulatory Visit (INDEPENDENT_AMBULATORY_CARE_PROVIDER_SITE_OTHER): Payer: Medicare Other | Admitting: Cardiovascular Disease

## 2016-07-07 ENCOUNTER — Encounter: Payer: Self-pay | Admitting: Cardiovascular Disease

## 2016-07-07 VITALS — BP 122/80 | HR 58 | Ht 69.0 in | Wt 209.4 lb

## 2016-07-07 DIAGNOSIS — E7800 Pure hypercholesterolemia, unspecified: Secondary | ICD-10-CM

## 2016-07-07 DIAGNOSIS — I739 Peripheral vascular disease, unspecified: Secondary | ICD-10-CM

## 2016-07-07 DIAGNOSIS — I10 Essential (primary) hypertension: Secondary | ICD-10-CM | POA: Diagnosis not present

## 2016-07-07 MED ORDER — ATORVASTATIN CALCIUM 40 MG PO TABS: 40.0000 mg | ORAL_TABLET | Freq: Every day | ORAL | 2 refills | Status: DC

## 2016-07-07 MED ORDER — ATORVASTATIN CALCIUM 40 MG PO TABS: 40.0000 mg | ORAL_TABLET | Freq: Every day | ORAL | 3 refills | Status: DC

## 2016-07-07 NOTE — Patient Instructions (Signed)
Medication Instructions:  Your physician has recommended you make the following change in your medication:  1. INCREASE Atorvastatin to '40mg'$  take one tablet by mouth every evening  Labwork: No new orders.   Testing/Procedures: No new orders.   Follow-Up: Your physician wants you to follow-up in: 6 MONTHS with Dr Fletcher Anon.  You will receive a reminder letter in the mail two months in advance. If you don't receive a letter, please call our office to schedule the follow-up appointment.   Any Other Special Instructions Will Be Listed Below (If Applicable).     If you need a refill on your cardiac medications before your next appointment, please call your pharmacy.

## 2016-07-07 NOTE — Progress Notes (Signed)
Cardiology Office Note   Date:  07/07/2016   ID:  Steven Hale., DOB [birth year], MRN [id number]  PCP:  Steven Redwood, MD  Cardiologist:  Dr. Jamesetta Hale  Chief Complaint  Patient presents with  . Follow-up    PAD/CAD      History of Present Illness: Steven Hale. is a 60 y.o. male who presents for a followup visit regarding  peripheral arterial disease and coronary artery disease. He has known history of coronary artery disease with multiple interventions in the past. He had CABG in 2011.  He quit smoking in 2009.  He has known history of peripheral arterial disease with intervention on both SFAs. He reports no claudication but does have are from peripheral neuropathy and plantar fasciitis on the right side. He had mild angina and abnormal stress test earlier this year. Cardiac catheterization in January showed mild nonobstructive LAD disease, occluded mid left circumflex and occluded proximal right coronary artery with left-to-right collaterals. SVG to RCA was occluded and LIMA to LAD was atretic. SVG to OM was normal. Abnormal stress test was due to chronically occluded right coronary artery and graft with left-to-right collaterals. His native RCA was not favorable for CTO PCI. I recommended medical therapy. I added Imdur 30 mg once daily. He reports no recurrent chest pain or shortness of breath. No significant leg claudication. He does complain of chronic back pain and right knee pain.   Past Medical History:  Diagnosis Date  . Anemia, unspecified   . Bell's palsy   . Blood transfusion    during treatment for Ca  . Blood transfusion without reported diagnosis   . CAD (coronary artery disease)    a. s/p multiple PCIs;  b. s/p CABG in 09/2010 (LIMA-LAD, SVG-OM1, SVG-distal RCA/OM2);  Cardiac cath in 12/2015: Mild LAD disease, occluded mid LCX and proximal RCA (left to right collaterals), Occluded SVG to RCA and atretic LIMA. Patent SVG to OM  . Chronic back pain   .  DJD (degenerative joint disease)    low back & all over   . GERD (gastroesophageal reflux disease)   . Helicobacter pylori (H. pylori) infection   . Hemorrhoids   . Hyperlipidemia   . Hypertension   . Impotence of organic origin   . Myocardial infarction ([facility])    2005 and 2012   . PAD (peripheral artery disease) ([facility] Hale)    a. s/p bilat SFA stents;  b. ABIs 11/2012: R 0.99, L 0.86.  [name] Pancreatic cancer [facility])    surgery 2009  . Pre-diabetes 08/16/2015    Past Surgical History:  Procedure Laterality Date  . ABDOMINAL AORTAGRAM N/A 09/13/2013   Procedure: ABDOMINAL Steven Hale;  Surgeon: Steven Hampshire, MD;  Location: [facility];  Service: Cardiovascular;  Laterality: N/A;  . ABDOMINAL AORTAGRAM N/A 08/29/2014   Procedure: ABDOMINAL Steven [facility];  Surgeon: Steven Hampshire, MD;  Location: [facility];  Service: Cardiovascular;  Laterality: N/A;  . BACK SURGERY     1992  . CARDIAC CATHETERIZATION N/A 12/25/2015   Procedure: Left Heart Cath and Cors/Grafts Angiography;  Surgeon: Steven Hampshire, MD;  Location: [facility];  Service: Cardiovascular;  Laterality: N/A;  . CORONARY ARTERY BYPASS GRAFT  nov 2011   x 4  . HEMORRHOID SURGERY  10/30/2011   Procedure: HEMORRHOIDECTOMY;  Surgeon: Steven Bowie, MD;  Location: [facility];  Service: General;  Laterality: N/A;  . pancreatic  cancer  05/10/2008   Resection of distal panrease and  spleen  . PARTIAL KNEE ARTHROPLASTY Right 11/19/2014   Procedure: RIGHT UNI-KNEE ARTHROPLASTY MEDIALLY;  Surgeon: Mauri Pole, MD;  Location: [facility];  Service: Orthopedics;  Laterality: Right;  . PERIPHERAL VASCULAR CATHETERIZATION  Oct 2014   Lt SFA PTA  . PERIPHERAL VASCULAR CATHETERIZATION  Oct 2015   ISR Lt SFA-PTA  . right partial knee replacement    . splenectomy    . THROMBECTOMY FEMORAL ARTERY Left 09/13/2013   Procedure: THROMBECTOMY FEMORAL ARTERY;  Surgeon: Steven Hampshire, MD;  Location: [facility];  Service: Cardiovascular;  Laterality:  Left;     Current Outpatient Prescriptions  Medication Sig Dispense Refill  . aspirin EC 81 MG tablet Take 1 tablet (81 mg total) by mouth daily.    [name] atorvastatin (LIPITOR) 40 MG tablet Take 0.5 tablets (20 mg total) by mouth daily. 90 tablet 2  . clopidogrel (PLAVIX) 75 MG tablet Take 75 mg by mouth daily.    . fish oil-omega-3 fatty acids 1000 MG capsule Take 1 capsule (1 g total) by mouth daily. 60 capsule 1  . gabapentin (NEURONTIN) 300 MG capsule Take 600 mg by mouth 3 (three) times daily.   2  . isosorbide mononitrate (IMDUR) 30 MG 24 hr tablet Take 1 tablet (30 mg total) by mouth daily. 90 tablet 3  . lisinopril (PRINIVIL,ZESTRIL) 5 MG tablet Take 0.5 tablets (2.5 mg total) by mouth daily. 15 tablet 0  . metFORMIN (GLUCOPHAGE) 500 MG tablet Take 1 tablet (500 mg total) by mouth 2 (two) times daily with a meal. 180 tablet 3  . metoprolol tartrate (LOPRESSOR) 25 MG tablet Take 1 tablet by mouth two  times daily 180 tablet 0  . Multiple Vitamin (MULTIVITAMIN) tablet Take 1 tablet by mouth daily. 30 tablet 10   No current facility-administered medications for this visit.     Allergies:   Review of patient's allergies indicates no known allergies.    Social History:  The patient  reports that he quit smoking about 7 years ago. His smoking use included Cigarettes. He has never used smokeless tobacco. He reports that he drinks about 1.8 oz of alcohol per week . He reports that he does not use drugs.   Family History:  The patient's family history includes Cancer in his father; Heart attack in his father.    ROS:  Please see the history of present illness.   Otherwise, review of systems are positive for none.   All other systems are reviewed and negative.    PHYSICAL EXAM: VS:  BP 122/80   Pulse (!) 58   Ht '5\' 9"'$  (1.753 m)   Wt 209 lb 6.4 oz (95 kg)   BMI 30.92 kg/m  , BMI Body mass index is 30.92 kg/m. GEN: Well nourished, well developed, in no acute distress  HEENT: normal    Neck: no JVD, carotid bruits, or masses Cardiac: RRR; no murmurs, rubs, or gallops,no edema  Respiratory:  clear to auscultation bilaterally, normal work of breathing GI: soft, nontender, nondistended, + BS MS: no deformity or atrophy  Skin: warm and dry, no rash Neuro:  Strength and sensation are intact Psych: euthymic mood, full affect   EKG:  EKG is not ordered today.    Recent Labs: 12/04/2015: ALT 32; TSH 1.549 12/17/2015: Hemoglobin 13.3; Platelets 270 12/25/2015: BUN 13; Potassium 4.2; Sodium 137 05/20/2016: Creat 0.97    Lipid Panel    Component Value Date/Time   CHOL 167 12/04/2015 0919   TRIG 181 (  H) 12/04/2015 0919   HDL 48 12/04/2015 0919   CHOLHDL 3.5 12/04/2015 0919   VLDL 36 (H) 12/04/2015 0919   LDLCALC 83 12/04/2015 0919   LDLDIRECT 80 11/06/2013 0925      Wt Readings from Last 3 Encounters:  07/07/16 209 lb 6.4 oz (95 kg)  05/20/16 208 lb 12.8 oz (94.7 kg)  02/20/16 216 lb (98 kg)        ASSESSMENT AND PLAN:  1.  Peripheral arterial disease: Previous bilateral SFA intervention. Currently with no claudication medical therapy. He has not been taking clopidogrel. He does not need to be on dual antiplatelet therapy and thus it was removed from his medication list.  2. Coronary artery disease involving bypass graft of native heart with stable angina: Known chronically occluded right coronary artery with left-to-right collaterals and no patent graft that territory. His symptoms resolved after adding Imdur. Continue medical therapy.  3. Essential hypertension: Blood pressure is controlled on current medications.  4. Hyperlipidemia: He is currently on atorvastatin 20 mg once daily. Most recent LDL was 83 which is not at target of less than 70. Thus, I increased the dose of atorvastatin 40 mg once daily.    Disposition:   FU with me in 6 months  Signed,  Kathlyn Sacramento, MD  07/07/2016 10:02 AM    [place]

## 2016-07-07 NOTE — Addendum Note (Signed)
Addended by: Barkley Boards on: 07/07/2016 10:21 AM   Modules accepted: Orders

## 2016-08-04 ENCOUNTER — Encounter: Payer: Self-pay | Admitting: Cardiology

## 2016-08-11 NOTE — Progress Notes (Signed)
HPI: FU CAD. He has a hx of multiple interventions in the past ultimately requiring CABG in 09/2010 (LIMA-LAD, SVG-OM1, SVG-distal RCA/OM2), 40% right renal artery stenosis by cardiac catheterization in 2011, PAD, s/p bilateral SFA stents in the past, HTN, HL, pancreatic cancer. Myoview 05/2012: EF 54%, no ischemia, no scar. Cardiac catheterization February 2017 showed a 99% circumflex, 100% right coronary artery, 30 LAD, Patent vein graft to the second marginal, occluded saphenous vein graft to the right coronary artery and LIMA to the LAD is atretic; normal LV function. Medical therapy recommended. Peripheral vascular disease is followed by Dr. Fletcher Anon. Since last seen, the patient denies any dyspnea on exertion, orthopnea, PND, pedal edema, palpitations, syncope or chest pain.   Current Outpatient Prescriptions  Medication Sig Dispense Refill  . aspirin EC 81 MG tablet Take 1 tablet (81 mg total) by mouth daily.    [name] atorvastatin (LIPITOR) 40 MG tablet Take 1 tablet (40 mg total) by mouth daily. 90 tablet 3  . fish oil-omega-3 fatty acids 1000 MG capsule Take 1 capsule (1 g total) by mouth daily. 60 capsule 1  . gabapentin (NEURONTIN) 300 MG capsule Take 600 mg by mouth 3 (three) times daily.   2  . isosorbide mononitrate (IMDUR) 30 MG 24 hr tablet Take 1 tablet (30 mg total) by mouth daily. 90 tablet 3  . lisinopril (PRINIVIL,ZESTRIL) 5 MG tablet Take 0.5 tablets (2.5 mg total) by mouth daily. 15 tablet 0  . metFORMIN (GLUCOPHAGE) 500 MG tablet Take 1 tablet (500 mg total) by mouth 2 (two) times daily with a meal. 180 tablet 3  . metoprolol tartrate (LOPRESSOR) 25 MG tablet Take 1 tablet by mouth two  times daily 180 tablet 0  . Multiple Vitamin (MULTIVITAMIN) tablet Take 1 tablet by mouth daily. 30 tablet 10   No current facility-administered medications for this visit.      Past Medical History:  Diagnosis Date  . Anemia, unspecified   . Bell's palsy   . Blood transfusion    during treatment for Ca  . Blood transfusion without reported diagnosis   . CAD (coronary artery disease)    a. s/p multiple PCIs;  b. s/p CABG in 09/2010 (LIMA-LAD, SVG-OM1, SVG-distal RCA/OM2);  Cardiac cath in 12/2015: Mild LAD disease, occluded mid LCX and proximal RCA (left to right collaterals), Occluded SVG to RCA and atretic LIMA. Patent SVG to OM  . Chronic back pain   . DJD (degenerative joint disease)    low back & all over   . GERD (gastroesophageal reflux disease)   . Helicobacter pylori (H. pylori) infection   . Hemorrhoids   . Hyperlipidemia   . Hypertension   . Impotence of organic origin   . Myocardial infarction    2005 and 2012   . PAD (peripheral artery disease) ([place])    a. s/p bilat SFA stents;  b. ABIs 11/2012: R 0.99, L 0.86.  [name] Pancreatic cancer [facility])    surgery 2009  . Pre-diabetes 08/16/2015    Past Surgical History:  Procedure Laterality Date  . ABDOMINAL AORTAGRAM N/A 09/13/2013   Procedure: ABDOMINAL Maxcine Ham;  Surgeon: Wellington Hampshire, MD;  Location: [place] CATH [facility];  Service: Cardiovascular;  Laterality: N/A;  . ABDOMINAL AORTAGRAM N/A 08/29/2014   Procedure: ABDOMINAL Maxcine Ham;  Surgeon: Wellington Hampshire, MD;  Location: [place] CATH [facility];  Service: Cardiovascular;  Laterality: N/A;  . BACK SURGERY     1992  . CARDIAC CATHETERIZATION N/A 12/25/2015  Procedure: Left Heart Cath and Cors/Grafts Angiography;  Surgeon: Wellington Hampshire, MD;  Location: [place] CV [facility];  Service: Cardiovascular;  Laterality: N/A;  . CORONARY ARTERY BYPASS GRAFT  nov 2011   x 4  . HEMORRHOID SURGERY  10/30/2011   Procedure: HEMORRHOIDECTOMY;  Surgeon: Harl Bowie, MD;  Location: [place];  Service: General;  Laterality: N/A;  . pancreatic  cancer  05/10/2008   Resection of distal panrease and spleen  . PARTIAL KNEE ARTHROPLASTY Right 11/19/2014   Procedure: RIGHT UNI-KNEE ARTHROPLASTY MEDIALLY;  Surgeon: Mauri Pole, MD;  Location: [facility];  Service: Orthopedics;  Laterality:  Right;  . PERIPHERAL VASCULAR CATHETERIZATION  Oct 2014   Lt SFA PTA  . PERIPHERAL VASCULAR CATHETERIZATION  Oct 2015   ISR Lt SFA-PTA  . right partial knee replacement    . splenectomy    . THROMBECTOMY FEMORAL ARTERY Left 09/13/2013   Procedure: THROMBECTOMY FEMORAL ARTERY;  Surgeon: Wellington Hampshire, MD;  Location: [place] CATH [facility];  Service: Cardiovascular;  Laterality: Left;    Social History   Social History  . Marital status: Single    Spouse name: N/A  . Number of children: 0  . Years of education: N/A   Occupational History  . Diability Unemployed   Social History Main Topics  . Smoking status: Former Smoker    Types: Cigarettes    Quit date: 11/29/2008  . Smokeless tobacco: Never Used  . Alcohol use 1.8 oz/week    3 Shots of liquor per week     Comment: 2 drinks of brandy every week   . Drug use: No  . Sexual activity: Not Currently   Other Topics Concern  . 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 Heart attack Father     died of MI at age 62  . Cancer Father   . Anesthesia problems Neg Hx   . Hypotension Neg Hx   . Malignant hyperthermia Neg Hx   . Pseudochol deficiency Neg Hx   . Colon cancer Neg Hx   . Esophageal cancer Neg Hx   . Prostate cancer Neg Hx   . Rectal cancer Neg Hx   . Stomach cancer Neg Hx     ROS: no fevers or chills, productive cough, hemoptysis, dysphasia, odynophagia, melena, hematochezia, dysuria, hematuria, rash, seizure activity, orthopnea, PND, pedal edema, claudication. Remaining systems are negative.  Physical Exam: Well-developed well-nourished in no acute distress.  Skin is warm and dry.  HEENT is normal.  Neck is supple.  Chest is clear to auscultation with normal expansion.  Cardiovascular exam is regular rate and rhythm.  Abdominal exam nontender or distended. No  masses palpated. Extremities show no edema. neuro grossly intact  ECG-Sinus bradycardia with no ST changes.  A/P  1 Coronary artery disease-continue aspirin and statin.  2 peripheral vascular disease-continue aspirin and statin. Followed by Dr. Fletcher Anon. No claudication.   3 hypertension-blood pressure controlled. Continue present medications. Check potassium and renal function.  4 hyperlipidemia-continue statin. Check lipids and liver. Check CK.   Kirk Ruths, MD

## 2016-08-17 ENCOUNTER — Encounter: Payer: Self-pay | Admitting: Cardiology

## 2016-08-17 ENCOUNTER — Other Ambulatory Visit: Payer: Self-pay | Admitting: Cardiology

## 2016-08-17 ENCOUNTER — Ambulatory Visit (INDEPENDENT_AMBULATORY_CARE_PROVIDER_SITE_OTHER): Payer: Medicare Other | Admitting: Cardiology

## 2016-08-17 VITALS — BP 102/74 | HR 59 | Ht 69.0 in | Wt 212.0 lb

## 2016-08-17 DIAGNOSIS — E784 Other hyperlipidemia: Secondary | ICD-10-CM | POA: Diagnosis not present

## 2016-08-17 DIAGNOSIS — I25810 Atherosclerosis of coronary artery bypass graft(s) without angina pectoris: Secondary | ICD-10-CM | POA: Diagnosis not present

## 2016-08-17 DIAGNOSIS — I10 Essential (primary) hypertension: Secondary | ICD-10-CM

## 2016-08-17 DIAGNOSIS — I739 Peripheral vascular disease, unspecified: Secondary | ICD-10-CM | POA: Diagnosis not present

## 2016-08-17 DIAGNOSIS — E7849 Other hyperlipidemia: Secondary | ICD-10-CM

## 2016-08-17 LAB — LIPID PANEL
Cholesterol: 147 mg/dL (ref 125–200)
HDL: 46 mg/dL (ref >=40)
LDL CALC: 73 mg/dL (ref <130)
Total CHOL/HDL Ratio: 3.2 Ratio (ref <=5.0)
Triglycerides: 138 mg/dL (ref <150)
VLDL: 28 mg/dL (ref <30)

## 2016-08-17 LAB — BASIC METABOLIC PANEL
BUN: 10 mg/dL (ref 7–25)
CALCIUM: 10.4 mg/dL — AB (ref 8.6–10.3)
CO2: 28 mmol/L (ref 20–31)
CREATININE: 0.89 mg/dL (ref 0.70–1.25)
Chloride: 102 mmol/L (ref 98–110)
Glucose, Bld: 99 mg/dL (ref 65–99)
Potassium: 5 mmol/L (ref 3.5–5.3)
Sodium: 139 mmol/L (ref 135–146)

## 2016-08-17 LAB — HEPATIC FUNCTION PANEL
ALBUMIN: 5 g/dL (ref 3.6–5.1)
ALK PHOS: 57 U/L (ref 40–115)
ALT: 18 U/L (ref 9–46)
AST: 21 U/L (ref 10–35)
Bilirubin, Direct: 0.1 mg/dL (ref <=0.2)
Indirect Bilirubin: 0.3 mg/dL (ref 0.2–1.2)
TOTAL PROTEIN: 7.6 g/dL (ref 6.1–8.1)
Total Bilirubin: 0.4 mg/dL (ref 0.2–1.2)

## 2016-08-17 LAB — CK: CK TOTAL: 250 U/L — AB (ref 7–232)

## 2016-08-17 NOTE — Patient Instructions (Signed)

## 2016-08-18 ENCOUNTER — Encounter: Payer: Self-pay | Admitting: *Deleted

## 2016-08-20 ENCOUNTER — Encounter: Payer: Self-pay | Admitting: Physician Assistant

## 2016-08-20 ENCOUNTER — Ambulatory Visit (INDEPENDENT_AMBULATORY_CARE_PROVIDER_SITE_OTHER): Payer: Medicare Other | Admitting: Physician Assistant

## 2016-08-20 DIAGNOSIS — R1031 Right lower quadrant pain: Secondary | ICD-10-CM | POA: Diagnosis not present

## 2016-08-20 DIAGNOSIS — R109 Unspecified abdominal pain: Secondary | ICD-10-CM | POA: Diagnosis not present

## 2016-08-20 LAB — COMPLETE METABOLIC PANEL WITH GFR
ALBUMIN: 5 g/dL (ref 3.6–5.1)
ALK PHOS: 59 U/L (ref 40–115)
ALT: 21 U/L (ref 9–46)
AST: 23 U/L (ref 10–35)
BUN: 13 mg/dL (ref 7–25)
CALCIUM: 10.7 mg/dL — AB (ref 8.6–10.3)
CHLORIDE: 99 mmol/L (ref 98–110)
CO2: 29 mmol/L (ref 20–31)
Creat: 0.96 mg/dL (ref 0.70–1.25)
GFR, EST NON AFRICAN AMERICAN: 86 mL/min (ref >=60)
Glucose, Bld: 96 mg/dL (ref 65–99)
POTASSIUM: 4.6 mmol/L (ref 3.5–5.3)
Sodium: 138 mmol/L (ref 135–146)
TOTAL PROTEIN: 7.7 g/dL (ref 6.1–8.1)
Total Bilirubin: 0.5 mg/dL (ref 0.2–1.2)

## 2016-08-20 LAB — POCT CBC
GRANULOCYTE PERCENT: 49.4 % (ref 37–80)
HEMATOCRIT: 35 % — AB (ref 43.5–53.7)
HEMOGLOBIN: 12.5 g/dL — AB (ref 14.1–18.1)
LYMPH, POC: 2.5 (ref 0.6–3.4)
MCH: 30.7 pg (ref 27–31.2)
MCHC: 35.8 g/dL — AB (ref 31.8–35.4)
MCV: 85.6 fL (ref 80–97)
MID (cbc): 0.6 (ref 0–0.9)
MPV: 7.9 fL (ref 0–99.8)
POC GRANULOCYTE: 3 (ref 2–6.9)
POC LYMPH PERCENT: 41.2 %L (ref 10–50)
POC MID %: 9.4 % (ref 0–12)
Platelet Count, POC: 246 10*3/uL (ref 142–424)
RBC: 4.08 M/uL — AB (ref 4.69–6.13)
RDW, POC: 13.9 %
WBC: 6 10*3/uL (ref 4.6–10.2)

## 2016-08-20 LAB — POCT URINALYSIS DIP (MANUAL ENTRY)
BILIRUBIN UA: NEGATIVE
BILIRUBIN UA: NEGATIVE
Glucose, UA: NEGATIVE
Leukocytes, UA: NEGATIVE
Nitrite, UA: NEGATIVE
PH UA: 8.5
RBC UA: NEGATIVE
SPEC GRAV UA: 1.015
Urobilinogen, UA: 1

## 2016-08-20 LAB — POC MICROSCOPIC URINALYSIS (UMFC): Mucus: ABSENT

## 2016-08-20 MED ORDER — MELOXICAM 7.5 MG PO TABS: 7.5000 mg | ORAL_TABLET | Freq: Every day | ORAL | 0 refills | Status: DC

## 2016-08-20 MED ORDER — CYCLOBENZAPRINE HCL 5 MG PO TABS: 5.0000 mg | ORAL_TABLET | Freq: Three times a day (TID) | ORAL | 1 refills | Status: DC | PRN

## 2016-08-20 NOTE — Progress Notes (Signed)
[facility] [address], [place] [address] [phone]  Date:  08/20/2016   Name:  Steven Hale.   DOB:  1955-12-10   MRN:  [id number]  PCP:  Marton Redwood, MD    History of Present Illness:  Steven Komar. is a 60 y.o. male patient who presents to [facility] for right sided abdominal pain.   --4 days ago in the evening.  He rose from seated position with pain along his upper abdomen and right side of chest abdomen.  He describes the pain as a pressure.  Lying down, the pain became worse.  He states that this is not constant.  He denies nausea.  He has no sob or dyspnea.  The pain over the 4 days is not associated with eating.  The morning of the symptoms initiating, he had a check up with cardiologist.  ekg was determined unremarkable, and kidney and liver function were normal.  No pain with deep inspiration.  No pain with heavy lifting.  He denies trauma or strenuous work.   EtOH: couple drinks per week.  None this week.  No pain with urination.  No hx of kidney stones.  Gallbladder is present with no past hx.     Patient Active Problem List   Diagnosis Date Noted  . Coronary artery disease involving coronary bypass graft of native heart   . Pre-diabetes 08/16/2015  . Peripheral neuropathy ([place]) 07/09/2015  . Other male erectile dysfunction 03/07/2015  . Essential hypertension, benign 03/07/2015  . Pure hypercholesterolemia 12/10/2014  . Essential hypertension 12/10/2014  . Low testosterone 12/10/2014  . S/P right UKR 11/19/2014  . Lumbar disc herniation with radiculopathy 10/15/2014  . Back pain 10/15/2014  . Acute back pain   . Chronic back pain 10/12/2014  . H/O pancreatic cancer 2009 10/12/2014  . Iron deficiency anemia 06/11/2014  . Testosterone deficiency 09/07/2013  . Benign paroxysmal positional vertigo 08/30/2013  . PAD (peripheral artery disease) ([place]) 06/02/2012  . Claudication ([place]) 05/14/2011  . BLOOD IN STOOL 09/20/2009  . PVD  (peripheral vascular disease) ([place]) 01/08/2009  . BELL'S PALSY, RIGHT 12/25/2008  . Hyperlipidemia 06/05/2008  . CAD- multiple PCIs, CABG 2011, low risk Myoview 05/2012 11/17/2003    Past Medical History:  Diagnosis Date  . Anemia, unspecified   . Bell's palsy   . Blood transfusion    during treatment for Ca  . Blood transfusion without reported diagnosis   . CAD (coronary artery disease)    a. s/p multiple PCIs;  b. s/p CABG in 09/2010 (LIMA-LAD, SVG-OM1, SVG-distal RCA/OM2);  Cardiac cath in 12/2015: Mild LAD disease, occluded mid LCX and proximal RCA (left to right collaterals), Occluded SVG to RCA and atretic LIMA. Patent SVG to OM  . Chronic back pain   . DJD (degenerative joint disease)    low back & all over   . GERD (gastroesophageal reflux disease)   . Helicobacter pylori (H. pylori) infection   . Hemorrhoids   . Hyperlipidemia   . Hypertension   . Impotence of organic origin   . Myocardial infarction    2005 and 2012   . PAD (peripheral artery disease) ([place])    a. s/p bilat SFA stents;  b. ABIs 11/2012: R 0.99, L 0.86.  [name] Pancreatic cancer [facility])    surgery 2009  . Pre-diabetes 08/16/2015    Past Surgical History:  Procedure Laterality Date  . ABDOMINAL AORTAGRAM N/A 09/13/2013   Procedure: ABDOMINAL AORTAGRAM;  Surgeon:  Wellington Hampshire, MD;  Location: [place] CATH LAB;  Service: Cardiovascular;  Laterality: N/A;  . ABDOMINAL AORTAGRAM N/A 08/29/2014   Procedure: ABDOMINAL Maxcine Ham;  Surgeon: Wellington Hampshire, MD;  Location: [place] CATH LAB;  Service: Cardiovascular;  Laterality: N/A;  . BACK SURGERY     1992  . CARDIAC CATHETERIZATION N/A 12/25/2015   Procedure: Left Heart Cath and Cors/Grafts Angiography;  Surgeon: Wellington Hampshire, MD;  Location: [place] CV LAB;  Service: Cardiovascular;  Laterality: N/A;  . CORONARY ARTERY BYPASS GRAFT  nov 2011   x 4  . HEMORRHOID SURGERY  10/30/2011   Procedure: HEMORRHOIDECTOMY;  Surgeon: Harl Bowie, MD;  Location: [place];   Service: General;  Laterality: N/A;  . pancreatic  cancer  05/10/2008   Resection of distal panrease and spleen  . PARTIAL KNEE ARTHROPLASTY Right 11/19/2014   Procedure: RIGHT UNI-KNEE ARTHROPLASTY MEDIALLY;  Surgeon: Mauri Pole, MD;  Location: [facility];  Service: Orthopedics;  Laterality: Right;  . PERIPHERAL VASCULAR CATHETERIZATION  Oct 2014   Lt SFA PTA  . PERIPHERAL VASCULAR CATHETERIZATION  Oct 2015   ISR Lt SFA-PTA  . right partial knee replacement    . splenectomy    . THROMBECTOMY FEMORAL ARTERY Left 09/13/2013   Procedure: THROMBECTOMY FEMORAL ARTERY;  Surgeon: Wellington Hampshire, MD;  Location: [place] CATH LAB;  Service: Cardiovascular;  Laterality: Left;    Social History  Substance Use Topics  . Smoking status: Former Smoker    Types: Cigarettes    Quit date: 11/29/2008  . Smokeless tobacco: Never Used  . Alcohol use 1.8 oz/week    3 Shots of liquor per week     Comment: 2 drinks of brandy every week     Family History  Problem Relation Age of Onset  . Heart attack Father     died of MI at age 7  . Cancer Father   . Anesthesia problems Neg Hx   . Hypotension Neg Hx   . Malignant hyperthermia Neg Hx   . Pseudochol deficiency Neg Hx   . Colon cancer Neg Hx   . Esophageal cancer Neg Hx   . Prostate cancer Neg Hx   . Rectal cancer Neg Hx   . Stomach cancer Neg Hx     No Known Allergies  Medication list has been reviewed and updated.  Current Outpatient Prescriptions on File Prior to Visit  Medication Sig Dispense Refill  . aspirin EC 81 MG tablet Take 1 tablet (81 mg total) by mouth daily.    [name] atorvastatin (LIPITOR) 40 MG tablet Take 1 tablet (40 mg total) by mouth daily. 90 tablet 3  . fish oil-omega-3 fatty acids 1000 MG capsule Take 1 capsule (1 g total) by mouth daily. 60 capsule 1  . gabapentin (NEURONTIN) 300 MG capsule Take 600 mg by mouth 3 (three) times daily.   2  . isosorbide mononitrate (IMDUR) 30 MG 24 hr tablet Take 1 tablet (30 mg total) by  mouth daily. 90 tablet 3  . lisinopril (PRINIVIL,ZESTRIL) 5 MG tablet Take 0.5 tablets (2.5 mg total) by mouth daily. 15 tablet 0  . metFORMIN (GLUCOPHAGE) 500 MG tablet Take 1 tablet (500 mg total) by mouth 2 (two) times daily with a meal. 180 tablet 3  . metoprolol tartrate (LOPRESSOR) 25 MG tablet Take 1 tablet by mouth two  times daily 180 tablet 0  . Multiple Vitamin (MULTIVITAMIN) tablet Take 1 tablet by mouth daily. 30 tablet 10  No current facility-administered medications on file prior to visit.     ROS ROS otherwise unremarkable unless listed above.   Physical Examination: BP 136/80 (BP Location: Right Arm, Cuff Size: Large)   Pulse 71   Temp 98.2 F (36.8 C) (Oral)   Resp 16   Wt 212 lb 9.6 oz (96.4 kg)   SpO2 96%   BMI 31.40 kg/m  Ideal Body Weight:    Physical Exam  Constitutional: He is oriented to person, place, and time. He appears well-developed and well-nourished. No distress.  HENT:  Head: Normocephalic and atraumatic.  Eyes: Conjunctivae and EOM are normal. Pupils are equal, round, and reactive to light.  Cardiovascular: Normal rate and regular rhythm.  Exam reveals no friction rub.   No murmur heard. Pulses:      Dorsalis pedis pulses are 2+ on the right side, and 2+ on the left side.  Pulmonary/Chest: Effort normal. No respiratory distress. He has no decreased breath sounds. He has no wheezes. He has no rhonchi. He exhibits no mass, no tenderness, no crepitus and no swelling.  Abdominal: Soft. Normal appearance and bowel sounds are normal. There is tenderness in the right lower quadrant. There is no guarding, no CVA tenderness, no tenderness at McBurney's point and negative Murphy's sign.  Right sided chest/abdominal pain with elevating to the seated position that is more pointed at the right flank and epigastric region.   Neurological: He is alert and oriented to person, place, and time.  Skin: Skin is warm and dry. He is not diaphoretic.  Psychiatric: He  has a normal mood and affect. His behavior is normal.    Results for orders placed or performed in visit on 08/20/16  POCT urinalysis dipstick  Result Value Ref Range   Color, UA yellow yellow   Clarity, UA clear clear   Glucose, UA negative negative   Bilirubin, UA negative negative   Ketones, POC UA negative negative   Spec Grav, UA 1.015    Blood, UA negative negative   pH, UA 8.5    Protein Ur, POC =30 (A) negative   Urobilinogen, UA 1.0    Nitrite, UA Negative Negative   Leukocytes, UA Negative Negative  POCT Microscopic Urinalysis ([facility])  Result Value Ref Range   WBC,UR,HPF,POC None None WBC/hpf   RBC,UR,HPF,POC None None RBC/hpf   Bacteria None None, Too numerous to count   Mucus Absent Absent   Epithelial Cells, UR Per Microscopy None None, Too numerous to count cells/hpf  POCT CBC  Result Value Ref Range   WBC 6.0 4.6 - 10.2 K/uL   Lymph, poc 2.5 0.6 - 3.4   POC LYMPH PERCENT 41.2 10 - 50 %L   MID (cbc) 0.6 0 - 0.9   POC MID % 9.4 0 - 12 %M   POC Granulocyte 3.0 2 - 6.9   Granulocyte percent 49.4 37 - 80 %G   RBC 4.08 (A) 4.69 - 6.13 M/uL   Hemoglobin 12.5 (A) 14.1 - 18.1 g/dL   HCT, POC 35.0 (A) 43.5 - 53.7 %   MCV 85.6 80 - 97 fL   MCH, POC 30.7 27 - 31.2 pg   MCHC 35.8 (A) 31.8 - 35.4 g/dL   RDW, POC 13.9 %   Platelet Count, POC 246 142 - 424 K/uL   MPV 7.9 0 - 99.8 fL     Assessment and Plan: Steven Hale. is a 60 y.o. male who is here today for cc of Of right side  abdominal pain. This appears to be musculoskeletal in nature. I'm advising a low-dose anti-inflammatory at this time. He will also use a muscle relaxant. We discussed precautions of it. Advised some stretching. He'll return if his symptoms worsen. I have given him of alarming symptoms that would warrant an immediate return.   If sxs do not improve may consider imaging.  RLQ abdominal pain - Plan: POCT urinalysis dipstick, POCT Microscopic Urinalysis ([facility]), POCT CBC, COMPLETE METABOLIC  PANEL WITH GFR, meloxicam (MOBIC) 7.5 MG tablet, cyclobenzaprine (FLEXERIL) 5 MG tablet  Right sided abdominal pain - Plan: POCT urinalysis dipstick, POCT Microscopic Urinalysis ([facility]), POCT CBC, COMPLETE METABOLIC PANEL WITH GFR, meloxicam (MOBIC) 7.5 MG tablet, cyclobenzaprine (FLEXERIL) 5 MG tablet  Steven Drape, PA-C [facility] 10/6/20179:24 AM  Addendum: metabolic panel unremarkable.  Advised add on for lipase.

## 2016-08-20 NOTE — Patient Instructions (Signed)
You can try icing the area along the rib, 3 times per day for 15 minutes.  Please take the anti-inflammatory without any ibuprofen or naproxen. You are able to take Tylenol. I will follow up with you about your lab results. Please note that the muscle relaxant can cause sedation so be careful. You can take the whole 10 mg at night. If you encounter any shortness of breath or trouble breathing, dizziness, nausea, increased pain I need to to return immediately or go to the emergency department. If your symptoms do not improve within the next 5 days I would like you to return.

## 2016-08-21 LAB — LIPASE: Lipase: 23 U/L (ref 7–60)

## 2016-09-08 ENCOUNTER — Other Ambulatory Visit: Payer: Self-pay | Admitting: Family Medicine

## 2016-09-08 DIAGNOSIS — R7303 Prediabetes: Secondary | ICD-10-CM

## 2016-09-08 MED FILL — metFORMIN HCL 500 MG TABS: 500 | 90 days supply | Qty: 180 | Fill #0

## 2016-09-18 ENCOUNTER — Telehealth: Payer: Self-pay | Admitting: Cardiovascular Disease

## 2016-09-18 ENCOUNTER — Other Ambulatory Visit: Payer: Self-pay | Admitting: *Deleted

## 2016-09-18 MED ORDER — METOPROLOL TARTRATE 25 MG PO TABS: ORAL_TABLET | ORAL | 0 refills | Status: DC

## 2016-09-18 NOTE — Telephone Encounter (Signed)
°*  STAT* If patient is at the pharmacy, call can be transferred to refill team.   1. Which medications need to be refilled? (please list name of each medication and dose if known) Metoprolol- he needs enough until his mail order come in  2. Which pharmacy/location (including street and city if local pharmacy) is medication to be sent to?[facility]  3. Do they need a 30 day or 90 day supply? [place]

## 2016-09-22 ENCOUNTER — Other Ambulatory Visit: Payer: Self-pay | Admitting: Cardiology

## 2016-10-13 ENCOUNTER — Encounter (HOSPITAL_COMMUNITY): Payer: Self-pay

## 2016-10-13 ENCOUNTER — Encounter: Payer: Self-pay | Admitting: Family Medicine

## 2016-10-13 ENCOUNTER — Emergency Department (HOSPITAL_COMMUNITY)
Admission: EM | Admit: 2016-10-13 | Discharge: 2016-10-13 | Disposition: A | Discharge status: Home / Self Care | Payer: Medicare Other | Attending: Emergency Medicine | Admitting: Emergency Medicine

## 2016-10-13 ENCOUNTER — Ambulatory Visit (INDEPENDENT_AMBULATORY_CARE_PROVIDER_SITE_OTHER): Payer: Medicare Other | Admitting: Family Medicine

## 2016-10-13 ENCOUNTER — Emergency Department (HOSPITAL_COMMUNITY): Payer: Medicare Other

## 2016-10-13 VITALS — BP 128/90 | HR 61 | Temp 98.3°F | Resp 17 | Ht 69.0 in | Wt 216.0 lb

## 2016-10-13 DIAGNOSIS — I252 Old myocardial infarction: Secondary | ICD-10-CM | POA: Insufficient documentation

## 2016-10-13 DIAGNOSIS — Z7982 Long term (current) use of aspirin: Secondary | ICD-10-CM | POA: Diagnosis not present

## 2016-10-13 DIAGNOSIS — I25119 Atherosclerotic heart disease of native coronary artery with unspecified angina pectoris: Secondary | ICD-10-CM | POA: Diagnosis not present

## 2016-10-13 DIAGNOSIS — R079 Chest pain, unspecified: Secondary | ICD-10-CM

## 2016-10-13 DIAGNOSIS — Z951 Presence of aortocoronary bypass graft: Secondary | ICD-10-CM | POA: Diagnosis not present

## 2016-10-13 DIAGNOSIS — Z87891 Personal history of nicotine dependence: Secondary | ICD-10-CM | POA: Diagnosis not present

## 2016-10-13 DIAGNOSIS — Z8507 Personal history of malignant neoplasm of pancreas: Secondary | ICD-10-CM | POA: Insufficient documentation

## 2016-10-13 DIAGNOSIS — E119 Type 2 diabetes mellitus without complications: Secondary | ICD-10-CM | POA: Diagnosis not present

## 2016-10-13 DIAGNOSIS — Z7984 Long term (current) use of oral hypoglycemic drugs: Secondary | ICD-10-CM | POA: Diagnosis not present

## 2016-10-13 DIAGNOSIS — R61 Generalized hyperhidrosis: Secondary | ICD-10-CM

## 2016-10-13 DIAGNOSIS — Z955 Presence of coronary angioplasty implant and graft: Secondary | ICD-10-CM | POA: Insufficient documentation

## 2016-10-13 DIAGNOSIS — R072 Precordial pain: Secondary | ICD-10-CM | POA: Diagnosis not present

## 2016-10-13 DIAGNOSIS — I10 Essential (primary) hypertension: Secondary | ICD-10-CM | POA: Diagnosis not present

## 2016-10-13 DIAGNOSIS — R0789 Other chest pain: Secondary | ICD-10-CM | POA: Diagnosis not present

## 2016-10-13 DIAGNOSIS — I209 Angina pectoris, unspecified: Secondary | ICD-10-CM

## 2016-10-13 LAB — CBC
HEMATOCRIT: 37.1 % — AB (ref 39.0–52.0)
HEMOGLOBIN: 12.2 g/dL — AB (ref 13.0–17.0)
MCH: 29.3 pg (ref 26.0–34.0)
MCHC: 32.9 g/dL (ref 30.0–36.0)
MCV: 89.2 fL (ref 78.0–100.0)
Platelets: 275 10*3/uL (ref 150–400)
RBC: 4.16 MIL/uL — AB (ref 4.22–5.81)
RDW: 15.2 % (ref 11.5–15.5)
WBC: 4.2 10*3/uL (ref 4.0–10.5)

## 2016-10-13 LAB — BASIC METABOLIC PANEL
ANION GAP: 8 (ref 5–15)
BUN: 8 mg/dL (ref 6–20)
CO2: 27 mmol/L (ref 22–32)
Calcium: 10.5 mg/dL — ABNORMAL HIGH (ref 8.9–10.3)
Chloride: 102 mmol/L (ref 101–111)
Creatinine, Ser: 1.07 mg/dL (ref 0.61–1.24)
Glucose, Bld: 99 mg/dL (ref 65–99)
POTASSIUM: 3.9 mmol/L (ref 3.5–5.1)
SODIUM: 137 mmol/L (ref 135–145)

## 2016-10-13 LAB — I-STAT TROPONIN, ED: Troponin i, poc: 0 ng/mL (ref 0.00–0.08)

## 2016-10-13 LAB — TROPONIN I

## 2016-10-13 MED ORDER — ASPIRIN 81 MG PO CHEW
243.0000 mg | CHEWABLE_TABLET | Freq: Once | ORAL | Status: AC
  Administered 2016-10-13: 243 mg via ORAL

## 2016-10-13 NOTE — ED Provider Notes (Signed)
[place] DEPT Provider Note   CSN: [id number] Arrival date & time: 10/13/16  1045     History   Chief Complaint Chief Complaint  Patient presents with  . Chest Pain    HPI Steven Albor. is a 60 y.o. male.  HPI 60 year old male with a hx of CAD with CABG in 2012 presents complaining of an episode of chest pain that started this morning at 9:30, lasted 30 minutes, and has since resolved prior to arrival to the ED. Patient relays that the pain began as he was walking around and taking out the garbage. Describes the pain as a burning sensation that is substernal and radiating to bilateral shoulders with associated diaphoresis and dyspnea. States that pain was constant until arrival to an urgent care and resolved following receiving '324mg'$  of Aspirin. Was sent to the ED for further evaluation. Denies nausea, vomiting, dizziness, lightheadedness, edema, fever, chills, or cough.   Past Medical History:  Diagnosis Date  . Anemia, unspecified   . Bell's palsy   . Blood transfusion    during treatment for Ca  . Blood transfusion without reported diagnosis   . CAD (coronary artery disease)    a. s/p multiple PCIs;  b. s/p CABG in 09/2010 (LIMA-LAD, SVG-OM1, SVG-distal RCA/OM2);  Cardiac cath in 12/2015: Mild LAD disease, occluded mid LCX and proximal RCA (left to right collaterals), Occluded SVG to RCA and atretic LIMA. Patent SVG to OM  . Chronic back pain   . DJD (degenerative joint disease)    low back & all over   . GERD (gastroesophageal reflux disease)   . Helicobacter pylori (H. pylori) infection   . Hemorrhoids   . Hyperlipidemia   . Hypertension   . Impotence of organic origin   . Myocardial infarction    2005 and 2012   . PAD (peripheral artery disease) ([place])    a. s/p bilat SFA stents;  b. ABIs 11/2012: R 0.99, L 0.86.  [name] Pancreatic cancer [facility])    surgery 2009  . Pre-diabetes 08/16/2015    Patient Active Problem List   Diagnosis Date Noted  . Coronary artery  disease involving coronary bypass graft of native heart   . Pre-diabetes 08/16/2015  . Peripheral neuropathy ([place]) 07/09/2015  . Other male erectile dysfunction 03/07/2015  . Essential hypertension, benign 03/07/2015  . Pure hypercholesterolemia 12/10/2014  . Essential hypertension 12/10/2014  . Low testosterone 12/10/2014  . S/P right UKR 11/19/2014  . Lumbar disc herniation with radiculopathy 10/15/2014  . Back pain 10/15/2014  . Acute back pain   . Chronic back pain 10/12/2014  . H/O pancreatic cancer 2009 10/12/2014  . Iron deficiency anemia 06/11/2014  . Testosterone deficiency 09/07/2013  . Benign paroxysmal positional vertigo 08/30/2013  . PAD (peripheral artery disease) ([place]) 06/02/2012  . Precordial pain 05/14/2011  . Claudication ([place]) 05/14/2011  . BLOOD IN STOOL 09/20/2009  . PVD (peripheral vascular disease) ([place]) 01/08/2009  . BELL'S PALSY, RIGHT 12/25/2008  . Hyperlipidemia 06/05/2008  . CAD- multiple PCIs, CABG 2011, low risk Myoview 05/2012 11/17/2003    Past Surgical History:  Procedure Laterality Date  . ABDOMINAL AORTAGRAM N/A 09/13/2013   Procedure: ABDOMINAL Maxcine Ham;  Surgeon: Wellington Hampshire, MD;  Location: [place] CATH LAB;  Service: Cardiovascular;  Laterality: N/A;  . ABDOMINAL AORTAGRAM N/A 08/29/2014   Procedure: ABDOMINAL Maxcine Ham;  Surgeon: Wellington Hampshire, MD;  Location: [place] CATH LAB;  Service: Cardiovascular;  Laterality: N/A;  . BACK SURGERY  [place] N/A 12/25/2015   Procedure: Left Heart Cath and Cors/Grafts Angiography;  Surgeon: Wellington Hampshire, MD;  Location: [place] CV LAB;  Service: Cardiovascular;  Laterality: N/A;  . CORONARY ARTERY BYPASS GRAFT  nov 2011   x 4  . HEMORRHOID SURGERY  10/30/2011   Procedure: HEMORRHOIDECTOMY;  Surgeon: Harl Bowie, MD;  Location: [place];  Service: General;  Laterality: N/A;  . pancreatic  cancer  05/10/2008   Resection of distal panrease and spleen  . PARTIAL KNEE  ARTHROPLASTY Right 11/19/2014   Procedure: RIGHT UNI-KNEE ARTHROPLASTY MEDIALLY;  Surgeon: Mauri Pole, MD;  [facility];  Service: Orthopedics;  Laterality: Right;  . PERIPHERAL VASCULAR CATHETERIZATION  Oct 2014   Lt SFA PTA  . PERIPHERAL VASCULAR CATHETERIZATION  Oct 2015   ISR Lt SFA-PTA  . right partial knee replacement    . splenectomy    . THROMBECTOMY FEMORAL ARTERY Left 09/13/2013   Procedure: THROMBECTOMY FEMORAL ARTERY;  Surgeon: Wellington Hampshire, MD;  Location: [place] CATH LAB;  Service: Cardiovascular;  Laterality: Left;       Home Medications    Prior to Admission medications   Medication Sig Start Date End Date Taking? Authorizing Provider  aspirin EC 81 MG tablet Take 1 tablet (81 mg total) by mouth daily. 11/19/14  Yes Danae Orleans, PA-C  atorvastatin (LIPITOR) 40 MG tablet Take 1 tablet (40 mg total) by mouth daily. 07/07/16  Yes Wellington Hampshire, MD  fish oil-omega-3 fatty acids 1000 MG capsule Take 1 capsule (1 g total) by mouth daily. 11/30/12  Yes Rosana Hoes, MD  gabapentin (NEURONTIN) 300 MG capsule Take 600 mg by mouth 3 (three) times daily.  06/20/15  Yes Historical Provider, MD  isosorbide mononitrate (IMDUR) 30 MG 24 hr tablet Take 1 tablet (30 mg total) by mouth daily. 01/07/16  Yes Wellington Hampshire, MD  lisinopril (PRINIVIL,ZESTRIL) 5 MG tablet TAKE 0.5 TABLETS BY MOUTH  DAILY. 09/22/16  Yes Lelon Perla, MD  metFORMIN (GLUCOPHAGE) 500 MG tablet TAKE 1 TABLET BY MOUTH 2 TIMES DAILY WITH A MEAL. 09/08/16  Yes Gay Filler Copland, MD  metoprolol tartrate (LOPRESSOR) 25 MG tablet Take 1 tablet by mouth two  times daily 09/18/16  Yes Lelon Perla, MD  Multiple Vitamin (MULTIVITAMIN) tablet Take 1 tablet by mouth daily. 11/30/12  Yes Rosana Hoes, MD  Multiple Vitamins-Minerals (MULTIVITAMIN WITH MINERALS) tablet Take 1 tablet by mouth daily.   Yes Historical Provider, MD  meloxicam (MOBIC) 7.5 MG tablet Take 1 tablet (7.5 mg total) by mouth daily. Patient not  taking: Reported on 10/13/2016 08/20/16   Joretta Bachelor, PA    Family History Family History  Problem Relation Age of Onset  . Heart attack Father     died of MI at age 72  . Cancer Father   . Anesthesia problems Neg Hx   . Hypotension Neg Hx   . Malignant hyperthermia Neg Hx   . Pseudochol deficiency Neg Hx   . Colon cancer Neg Hx   . Esophageal cancer Neg Hx   . Prostate cancer Neg Hx   . Rectal cancer Neg Hx   . Stomach cancer Neg Hx     Social History Social History  Substance Use Topics  . Smoking status: Former Smoker    Types: Cigarettes    Quit date: 11/29/2008  . Smokeless tobacco: Never Used  . Alcohol use 1.8 oz/week    3 Shots of  liquor per week     Comment: 2 drinks of brandy every week      Allergies   Patient has no known allergies.   Review of Systems Review of Systems  ROS 10 Systems reviewed and are negative for acute change except as noted in the HPI.     Physical Exam Updated Vital Signs BP 137/96 (BP Location: Left Arm)   Pulse (!) 56   Temp 97.8 F (36.6 C) (Oral)   Resp 19   Ht '5\' 9"'$  (1.753 m)   Wt 216 lb (98 kg)   SpO2 100%   BMI 31.90 kg/m   Physical Exam  Constitutional: He is oriented to person, place, and time. He appears well-developed.  HENT:  Head: Normocephalic and atraumatic.  Eyes: Conjunctivae and EOM are normal. Pupils are equal, round, and reactive to light.  Neck: Normal range of motion. Neck supple.  Cardiovascular: Normal rate and regular rhythm.   Pulmonary/Chest: Effort normal and breath sounds normal.  Abdominal: Soft. Bowel sounds are normal. He exhibits no distension. There is no tenderness. There is no rebound and no guarding.  Neurological: He is alert and oriented to person, place, and time.  Skin: Skin is warm.  Nursing note and vitals reviewed.    ED Treatments / Results  Labs (all labs ordered are listed, but only abnormal results are displayed) Labs Reviewed  BASIC METABOLIC PANEL -  Abnormal; Notable for the following:       Result Value   Calcium 10.5 (*)    All other components within normal limits  CBC - Abnormal; Notable for the following:    RBC 4.16 (*)    Hemoglobin 12.2 (*)    HCT 37.1 (*)    All other components within normal limits  TROPONIN I  I-STAT TROPOININ, ED    EKG  EKG Interpretation  Date/Time:  Tuesday October 13 2016 10:50:08 EST Ventricular Rate:  52 PR Interval:    QRS Duration: 104 QT Interval:  435 QTC Calculation: 405 R Axis:   76 Text Interpretation:  Sinus rhythm No acute changes No significant change since last tracing Confirmed by Kathrynn Humble, MD, Thelma Comp [phone]) on 10/13/2016 11:28:56 AM Also confirmed by Kathrynn Humble, MD, Thelma Comp [phone])  on 10/13/2016 11:43:01 AM       Radiology Dg Chest 2 View  Result Date: 10/13/2016 CLINICAL DATA:  Patient was walking this morning and developed left chest pain radiating to right, also became diaphoretic. EXAM: CHEST  2 VIEW COMPARISON:  12/04/2015 FINDINGS: Prior CABG. The heart size and mediastinal contours are within normal limits. Both lungs are clear. The visualized skeletal structures are unremarkable. IMPRESSION: No active cardiopulmonary disease. Electronically Signed   By: Rolm Baptise M.D.   On: 10/13/2016 11:59    Procedures Procedures (including critical care time)  FEB 2017:  Conclusion:   Prox Cx to Mid Cx lesion, 99% stenosed. The lesion was previously treated with a stent (unknown type).  Prox LAD to Mid LAD lesion, 30% stenosed.  Prox RCA lesion, 100% stenosed.  SVG was injected is normal in caliber, and is anatomically normal.  SVG was injected .  Origin lesion, 100% stenosed.  The left ventricular systolic function is normal.   1. Significant 2 vessel coronary artery disease with patent SVG to OM 2. SVG to RCA is occluded and LIMA to LAD is atretic. However, there is no obstructive disease affecting the LAD system. The right coronary artery is chronically  occluded with good left-to-right collaterals.  2. Normal LV systolic function and mildly elevated left ventricular end-diastolic pressure.  Recommendations: Continue aggressive medical therapy. The RCA does not appear favorable for CTO PCI.     Medications Ordered in ED Medications - No data to display   Initial Impression / Assessment and Plan / ED Course  I have reviewed the triage vital signs and the nursing notes.  Pertinent labs & imaging results that were available during my care of the patient were reviewed by me and considered in my medical decision making (see chart for details).  Clinical Course as of Oct 13 1541  Tue Oct 13, 2016  1541 Cardiology has seen the patient. Their tentative plan is to get serial trops, which is neg, and pt pain free, discharge. They will set up a follow up. Signing out care to Dr. Vanita Panda.  [AN]    Clinical Course User Index [AN] Varney Biles, MD    60 year old male presents with exertional chest pain this AM that lasted 30 minutes and has resolved since arrival to the ED. Given patient's extensive cardiac history, exertional quality of pain, and associated diaphoresis and dyspnea at onset likely ACS vs. stable angina. Most recent catheterization 12/2015 reviewed.  Patient received '324mg'$  of ASA prior to arrival. Evaluated with EKG and troponin to evaluate for ischemia, CXR to evaluate for anatomical abnormalities and infection, CBC to evaluate for anemia, and BMP to evaluate for electrolyte abnormality. Work-up within normal limits with exception of a normocytic normochromic anemia that is consistent with previous lab work. With patient's presentation, resolution of sxs, unchanged EKG, negative troponin, and negative CXR will consult cardiology to determine if patient needs to be held for repeat cardiac enzyme or admitted for possible further evaluation.   Final Clinical Impressions(s) / ED Diagnoses   Final diagnoses:  Angina pectoris [facility])     New Prescriptions New Prescriptions   No medications on file     Varney Biles, MD 10/13/16 1542

## 2016-10-13 NOTE — Progress Notes (Signed)
Chief Complaint  Patient presents with  . Chest Pain    onset this am  . Shortness of Breath  . Excessive Sweating    HPI  Pt has a history of CAD S/P MULTIPLE STENTS with a history of MI He reports that he devleloped chest pain and diaphoresis 45 minutes ago while driving He is a known diabetic and did not eat breakfast this morning but had a piece of a banana He does not normally check his sugars at home  He reports that he was not doing any activities He has been compliant with his meds and took his aspirin '81mg'$  He reports that he has been feeling pressure and a tearing feeling in his chest across the chest wall but mostly on the left side Non radiating No nausea or vomiting No loss of consciousness or difficulty breathing Reports that he just wanted to get checked. He thought it must have been all the food from the holiday but knew that this was similar to his heart attack but not as severe.    Diabetes He reports that he does not check his blood sugars at home He takes metformin daily No side effects He denies recent hypoglycemia symptoms such as sweats nausea or vomiting prior to this episode.  CAD Coronary Artery Disease: Patient presents for routine coronary artery disease follow-up.  Current symptoms: chest tightness, pressure-like, vice-like Pain radiation: none Patient also complains of diaphoresis. Pain aggravating factors: movement Pain relieved by resting.  Cardiac risk factors include none.   Lab Results  Component Value Date   HGBA1C 6.2 05/20/2016    Past Medical History:  Diagnosis Date  . Anemia, unspecified   . Bell's palsy   . Blood transfusion    during treatment for Ca  . Blood transfusion without reported diagnosis   . CAD (coronary artery disease)    a. s/p multiple PCIs;  b. s/p CABG in 09/2010 (LIMA-LAD, SVG-OM1, SVG-distal RCA/OM2);  Cardiac cath in 12/2015: Mild LAD disease, occluded mid LCX and proximal RCA (left to right collaterals),  Occluded SVG to RCA and atretic LIMA. Patent SVG to OM  . Chronic back pain   . DJD (degenerative joint disease)    low back & all over   . GERD (gastroesophageal reflux disease)   . Helicobacter pylori (H. pylori) infection   . Hemorrhoids   . Hyperlipidemia   . Hypertension   . Impotence of organic origin   . Myocardial infarction    2005 and 2012   . PAD (peripheral artery disease) ([place])    a. s/p bilat SFA stents;  b. ABIs 11/2012: R 0.99, L 0.86.  [name] Pancreatic cancer [facility])    surgery 2009  . Pre-diabetes 08/16/2015    Current Outpatient Prescriptions  Medication Sig Dispense Refill  . aspirin EC 81 MG tablet Take 1 tablet (81 mg total) by mouth daily.    [name] atorvastatin (LIPITOR) 40 MG tablet Take 1 tablet (40 mg total) by mouth daily. 90 tablet 3  . fish oil-omega-3 fatty acids 1000 MG capsule Take 1 capsule (1 g total) by mouth daily. 60 capsule 1  . gabapentin (NEURONTIN) 300 MG capsule Take 600 mg by mouth 3 (three) times daily.   2  . isosorbide mononitrate (IMDUR) 30 MG 24 hr tablet Take 1 tablet (30 mg total) by mouth daily. 90 tablet 3  . lisinopril (PRINIVIL,ZESTRIL) 5 MG tablet TAKE 0.5 TABLETS BY MOUTH  DAILY. 15 tablet 0  . meloxicam (MOBIC) 7.5 MG  tablet Take 1 tablet (7.5 mg total) by mouth daily. 30 tablet 0  . metFORMIN (GLUCOPHAGE) 500 MG tablet TAKE 1 TABLET BY MOUTH 2 TIMES DAILY WITH A MEAL. 180 tablet 3  . metoprolol tartrate (LOPRESSOR) 25 MG tablet Take 1 tablet by mouth two  times daily 60 tablet 0  . Multiple Vitamin (MULTIVITAMIN) tablet Take 1 tablet by mouth daily. 30 tablet 10   Current Facility-Administered Medications  Medication Dose Route Frequency Provider Last Rate Last Dose  . aspirin chewable tablet 243 mg  243 mg Oral Once [name], MD        Allergies: No Known Allergies  Past Surgical History:  Procedure Laterality Date  . ABDOMINAL AORTAGRAM N/A 09/13/2013   Procedure: ABDOMINAL Maxcine Ham;  Surgeon: Wellington Hampshire, MD;   Location: [place] CATH LAB;  Service: Cardiovascular;  Laterality: N/A;  . ABDOMINAL AORTAGRAM N/A 08/29/2014   Procedure: ABDOMINAL Maxcine Ham;  Surgeon: Wellington Hampshire, MD;  Location: [place] CATH LAB;  Service: Cardiovascular;  Laterality: N/A;  . BACK SURGERY     1992  . CARDIAC CATHETERIZATION N/A 12/25/2015   Procedure: Left Heart Cath and Cors/Grafts Angiography;  Surgeon: Wellington Hampshire, MD;  Location: [place] CV LAB;  Service: Cardiovascular;  Laterality: N/A;  . CORONARY ARTERY BYPASS GRAFT  nov 2011   x 4  . HEMORRHOID SURGERY  10/30/2011   Procedure: HEMORRHOIDECTOMY;  Surgeon: Harl Bowie, MD;  Location: [place];  Service: General;  Laterality: N/A;  . pancreatic  cancer  05/10/2008   Resection of distal panrease and spleen  . PARTIAL KNEE ARTHROPLASTY Right 11/19/2014   Procedure: RIGHT UNI-KNEE ARTHROPLASTY MEDIALLY;  Surgeon: Mauri Pole, MD;  [facility];  Service: Orthopedics;  Laterality: Right;  . PERIPHERAL VASCULAR CATHETERIZATION  Oct 2014   Lt SFA PTA  . PERIPHERAL VASCULAR CATHETERIZATION  Oct 2015   ISR Lt SFA-PTA  . right partial knee replacement    . splenectomy    . THROMBECTOMY FEMORAL ARTERY Left 09/13/2013   Procedure: THROMBECTOMY FEMORAL ARTERY;  Surgeon: Wellington Hampshire, MD;  Location: [place] CATH LAB;  Service: Cardiovascular;  Laterality: Left;    Social History   Social History  . Marital status: Single    Spouse name: N/A  . Number of children: 0  . Years of education: N/A   Occupational History  . Diability Unemployed   Social History Main Topics  . Smoking status: Former Smoker    Types: Cigarettes    Quit date: 11/29/2008  . Smokeless tobacco: Never Used  . Alcohol use 1.8 oz/week    3 Shots of liquor per week     Comment: 2 drinks of brandy every week   . Drug use: No  . Sexual activity: Not Currently   Other Topics Concern  . 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 He appears well-developed and well-nourished.  HENT:  Head: Normocephalic and atraumatic.  Eyes: Conjunctivae and EOM are normal.  Cardiovascular: Normal rate, regular rhythm, normal  heart sounds and intact distal pulses.   No murmur heard. No thrill No reproducible chest pain   Pulmonary/Chest: Effort normal and breath sounds normal. No respiratory distress. He has no wheezes.  Abdominal: Soft. Bowel sounds are normal. He exhibits no distension. There is no tenderness.  Musculoskeletal: Normal range of motion. He exhibits no edema.  Neurological: He is alert and oriented to person, place, and time. No cranial nerve deficit.  Skin: Skin is warm.  diaphoretic     ECG Normal sinus rhythm Rate 59 No st elevation or t wave inversion   Assessment and Plan Iver was seen today for chest pain, shortness of breath and excessive sweating.  Diagnoses and all orders for this visit:  Other chest pain -     EKG 12-Lead -     aspirin chewable tablet 243 mg; Chew 3 tablets (243 mg total) by mouth once.  Left sided chest pain -     aspirin chewable tablet 243 mg; Chew 3 tablets (243 mg total) by mouth once. -     Insert peripheral IV  Diaphoresis -     POCT glucose (manual entry)  Type 2 diabetes mellitus without complication, without long-term current use of insulin [facility])   Medical Decision Making Will check his blood sugar - glucose 111 Will start peripheral IV Transfer by paramedic to the ER for further evaluation since pt has cardiac history of MI and multiple stents and is a 60yo male with diabetes Plan to transfer for high level of care and to rule out NSTEMI No ECG changes  compared to October 2017 He was transferred to the ER because of his symptoms which seem cardiac in origin High Risk Patient   [place]

## 2016-10-13 NOTE — ED Notes (Signed)
Pt. Transferred to X-ray

## 2016-10-13 NOTE — Progress Notes (Signed)
Iv inserted right hand 22 g  Sharee Pimple rn NS I attempt left hand by Santiago Glad rn 20g

## 2016-10-13 NOTE — Discharge Instructions (Signed)

## 2016-10-13 NOTE — ED Triage Notes (Signed)
Pt. Was walking when he developed chest pain that radiated from rt. To lt. Chest wall.  Pt. Also became diphoretic.  He made it to his call and went to [facility].  He received 324 mg ASA and has been pain free since ambulance transport.  Pt. Is pain free upon arrival to the ED and SB on the monitor

## 2016-10-13 NOTE — Consult Note (Signed)
Cardiology Consult    Patient ID: Steven Hale. MRN: [id number], DOB/AGE: 1955-12-29   Consult Date: 10/13/2016  Requesting Provider: Dr. Kathrynn Humble  Primary Physician: Marton Redwood, MD Primary Cardiologist: Dr. Stanford Breed Dr. Fletcher Anon   History of Present Illness    Steven Gill. is a 60 y.o. male with past medical history of CAD (s/p CABG in 2011 w/ LIMA-LAD, SVG-OM1, SVG-distal RCA-OM2), PAD (s/p bilateral SFA stenting), HTN, HLD, and pancreatic cancer who presents to [facility] ED on 10/13/2016 for evaluation of chest pain.   He had an abnormal stress test in 11/2015 and underwent repeat cardiac catheterization in 12/2015. Cath showed 2-vessel CAD with patent SVG-OM2, occluded SVG-RCA, and LIMA-LAD is atretic with no obstructive disease affecting the LAD. RCA was chronically occluded with good left-to-right collaterals. LV function was normal.   Today, he developed chest discomfort and diaphoresis while walking outside. Denies any associated nausea, vomiting, or dyspnea. The pain lasted for approximately 45 minutes then spontaneously resolved. He describes it as a shooting pain, which he says feels different from what he experienced in the past when he required initial CABG and subsequent caths. However, his main presenting symptom at that time was diaphoresis.   He feels as if his pain is related to 2 new supplements he took yesterday (Testosterone and new MV). He performs water aerobics 3-4 times per week and denies any chest discomfort or dyspnea with exertion when performing these activities. Says he had been "feeling great: until today. PTA Medication regimen includes ASA '81mg'$  daily, Lipitor '40mg'$  daily, Imdur '30mg'$  daily, Lisinopril 2.'5mg'$  daily, and Lopressor '25mg'$  BID.   Initial labs show a WBC of 4.2, Hgb 12.2, and platelets 275. K+ 3.9. Creatinine 1.07. Initial troponin negative. CXR with no active cardiopulmonary disease. EKG shows sinus bradycardia, HR 52, with no acute ST or  T-wave changes.    Past Medical History   Past Medical History:  Diagnosis Date  . Anemia, unspecified   . Bell's palsy   . Blood transfusion    during treatment for Ca  . Blood transfusion without reported diagnosis   . CAD (coronary artery disease)    a. s/p multiple PCIs;  b. s/p CABG in 09/2010 (LIMA-LAD, SVG-OM1, SVG-distal RCA/OM2);  Cardiac cath in 12/2015: Mild LAD disease, occluded mid LCX and proximal RCA (left to right collaterals), Occluded SVG to RCA and atretic LIMA. Patent SVG to OM  . Chronic back pain   . DJD (degenerative joint disease)    low back & all over   . GERD (gastroesophageal reflux disease)   . Helicobacter pylori (H. pylori) infection   . Hemorrhoids   . Hyperlipidemia   . Hypertension   . Impotence of organic origin   . Myocardial infarction    2005 and 2012   . PAD (peripheral artery disease) ([place])    a. s/p bilat SFA stents;  b. ABIs 11/2012: R 0.99, L 0.86.  [name] Pancreatic cancer [facility])    surgery 2009  . Pre-diabetes 08/16/2015    Past Surgical History:  Procedure Laterality Date  . ABDOMINAL AORTAGRAM N/A 09/13/2013   Procedure: ABDOMINAL Maxcine Ham;  Surgeon: Wellington Hampshire, MD;  Location: [place] CATH LAB;  Service: Cardiovascular;  Laterality: N/A;  . ABDOMINAL AORTAGRAM N/A 08/29/2014   Procedure: ABDOMINAL Maxcine Ham;  Surgeon: Wellington Hampshire, MD;  Location: [place] CATH LAB;  Service: Cardiovascular;  Laterality: N/A;  . BACK SURGERY     1992  . CARDIAC CATHETERIZATION N/A 12/25/2015  Procedure: Left Heart Cath and Cors/Grafts Angiography;  Surgeon: Wellington Hampshire, MD;  Location: [place] CV LAB;  Service: Cardiovascular;  Laterality: N/A;  . CORONARY ARTERY BYPASS GRAFT  nov 2011   x 4  . HEMORRHOID SURGERY  10/30/2011   Procedure: HEMORRHOIDECTOMY;  Surgeon: Harl Bowie, MD;  Location: [place];  Service: General;  Laterality: N/A;  . pancreatic  cancer  05/10/2008   Resection of distal panrease and spleen  . PARTIAL KNEE  ARTHROPLASTY Right 11/19/2014   Procedure: RIGHT UNI-KNEE ARTHROPLASTY MEDIALLY;  Surgeon: Mauri Pole, MD;  [facility];  Service: Orthopedics;  Laterality: Right;  . PERIPHERAL VASCULAR CATHETERIZATION  Oct 2014   Lt SFA PTA  . PERIPHERAL VASCULAR CATHETERIZATION  Oct 2015   ISR Lt SFA-PTA  . right partial knee replacement    . splenectomy    . THROMBECTOMY FEMORAL ARTERY Left 09/13/2013   Procedure: THROMBECTOMY FEMORAL ARTERY;  Surgeon: Wellington Hampshire, MD;  Location: [place] CATH LAB;  Service: Cardiovascular;  Laterality: Left;     Allergies  No Known Allergies  Inpatient Medications      Family History    Family History  Problem Relation Age of Onset  . Heart attack Father     died of MI at age 74  . Cancer Father   . Anesthesia problems Neg Hx   . Hypotension Neg Hx   . Malignant hyperthermia Neg Hx   . Pseudochol deficiency Neg Hx   . Colon cancer Neg Hx   . Esophageal cancer Neg Hx   . Prostate cancer Neg Hx   . Rectal cancer Neg Hx   . Stomach cancer Neg Hx     Social History    Social History   Social History  . Marital status: Single    Spouse name: N/A  . Number of children: 0  . Years of education: N/A   Occupational History  . Diability Unemployed   Social History Main Topics  . Smoking status: Former Smoker    Types: Cigarettes    Quit date: 11/29/2008  . Smokeless tobacco: Never Used  . Alcohol use 1.8 oz/week    3 Shots of liquor per week     Comment: 2 drinks of brandy every week   . Drug use: No  . Sexual activity: Not Currently   Other Topics Concern  . 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  Cardiovascular:  No dyspnea on exertion, edema, orthopnea, palpitations, paroxysmal nocturnal dyspnea. Positive for chest pain and diaphoresis.    Dermatological: No rash, lesions/masses Respiratory: No cough, dyspnea Urologic: No hematuria, dysuria Abdominal:   No nausea, vomiting, diarrhea, bright red blood per rectum, melena, or hematemesis Neurologic:  No visual changes, wkns, changes in mental status. All other systems reviewed and are otherwise negative except as noted above.  Physical Exam    Blood pressure 129/78, pulse (!) 49, temperature 97.8 F (36.6 C), temperature source Oral, resp. rate 13, height '5\' 9"'$  (1.753 m), weight 216 lb (98 kg), SpO2 98 %.  General: Pleasant, African American male appearing in NAD Psych: Normal affect. Neuro: Alert and oriented X 3. Moves all extremities spontaneously. HEENT: Normal  Neck: Supple without bruits or JVD. Lungs:  Resp regular and unlabored, CTA without wheezing or rales. Heart: RRR no s3, s4, or murmurs. Abdomen: Soft, non-tender, non-distended, BS + x 4.  Extremities: No clubbing, cyanosis or edema. DP/PT/Radials 2+ and equal bilaterally.  Labs    Troponin (Point of Care Test)  Recent Labs  10/13/16 1106  TROPIPOC 0.00   No results for input(s): CKTOTAL, CKMB, TROPONINI in the last 72 hours. Lab Results  Component Value Date   WBC 4.2 10/13/2016   HGB 12.2 (L) 10/13/2016   HCT 37.1 (L) 10/13/2016   MCV 89.2 10/13/2016   PLT 275 10/13/2016    Recent Labs Lab 10/13/16 1059  NA 137  K 3.9  CL 102  CO2 27  BUN 8  CREATININE 1.07  CALCIUM 10.5*  GLUCOSE 99   Lab Results  Component Value Date   CHOL 147 08/17/2016   HDL 46 08/17/2016   LDLCALC 73 08/17/2016   TRIG 138 08/17/2016   Lab Results  Component Value Date   DDIMER  10/16/2008    0.28        AT THE INHOUSE ESTABLISHED CUTOFF VALUE OF 0.48 ug/mL FEU, THIS ASSAY HAS BEEN DOCUMENTED IN THE LITERATURE TO HAVE     Radiology Studies    Dg Chest 2 View  Result Date: 10/13/2016 CLINICAL DATA:  Patient was walking this morning and developed left chest pain radiating to right, also became  diaphoretic. EXAM: CHEST  2 VIEW COMPARISON:  12/04/2015 FINDINGS: Prior CABG. The heart size and mediastinal contours are within normal limits. Both lungs are clear. The visualized skeletal structures are unremarkable. IMPRESSION: No active cardiopulmonary disease. Electronically Signed   By: Rolm Baptise M.D.   On: 10/13/2016 11:59    EKG & Cardiac Imaging    EKG: Sinus bradycardia, HR 52, with no acute ST or T-wave changes.   Cardiac Catheterization: 12/25/2015  Prox Cx to Mid Cx lesion, 99% stenosed. The lesion was previously treated with a stent (unknown type).  Prox LAD to Mid LAD lesion, 30% stenosed.  Prox RCA lesion, 100% stenosed.  SVG was injected is normal in caliber, and is anatomically normal.  SVG was injected .  Origin lesion, 100% stenosed.  The left ventricular systolic function is normal.   1. Significant 2 vessel coronary artery disease with patent SVG to OM 2. SVG to RCA is occluded and LIMA to LAD is atretic. However, there is no obstructive disease affecting the LAD system. The right coronary artery is chronically occluded with good left-to-right collaterals.  2. Normal LV systolic function and mildly elevated left ventricular end-diastolic pressure.  Recommendations: Continue aggressive medical therapy. The RCA does not appear favorable for CTO PCI.   Assessment & Plan    1. Chest Pain/ Significant CAD - s/p CABG in 2011 w/ LIMA-LAD, SVG-OM1, SVG-distal RCA-OM2. Cath in 12/2015 following abnormal NST showed 2-vessel CAD with patent SVG-OM2, occluded SVG-RCA, and LIMA-LAD was atretic with no obstructive disease affecting the LAD. RCA was chronically occluded with good left-to-right collaterals. LV function was normal.  - developed chest discomfort and diaphoresis this morning which lasted for 45 minutes then spontaneously resolved. He describes it as a shooting pain, which he says feels different from what he experienced in the past when he required initial  CABG and subsequent caths. Did take two new medications yesterday (Testosterone and new MV). Has been performing water aerobics 3-4 times per week without any anginal symptoms - initial Troponin negative and EKG is without acute ischemic changes. Ambulated around  the ER with the patient and he denies any repeat anginal symptoms.  - will check delta-troponin. If negative, he can likely be discharged from the ER with close follow-up in the clinic. Should be given Rx today for SL NTG to carry with him. If repeat trop elevated, he will need to be admitted for further evaluation and workup.   2. HTN - BP well-controlled.  - continue Imdur, Lisinopril, and Lopressor.   3. HLD - continue statin therapy.   4. PAD - s/p bilateral SFA stenting. Followed by Dr. Fletcher Anon in the outpatient setting. - continue ASA and statin therapy.    Signed, Erma Heritage, PA-C 10/13/2016, 2:15 PM Pager: [phone]  I have personally seen and examined this patient with Bernerd Pho, PA-C. I agree with the assessment and plan as outlined above. He is known to have CAD s/p CABG in 2011.Last cath in February 2017 with patent SVG to OM, native LAD patent (atretic LIMA due to good native flow in LAD), occluded RCA with occluded SVG to RCA but good left to right collaterals to RCA. He had sharp chest pains today with diaphoresis but this quickly resolved. First troponin is negative. EKG without ischemic changes. I personally reviewed his EKG.  No recurrent chest pain. He is asking to go home.  My exam shows a WDWN male in NAD. CV:RRR. Lungs: clear bilaterally. Ext: no edema.   -I would check one more troponin (last one at 11am). If negative, OK to d/c home today and have outpatient cardiac follow up.   Lauree Chandler 10/13/2016 3:31 PM

## 2016-10-15 ENCOUNTER — Ambulatory Visit: Payer: Medicare Other

## 2016-10-15 NOTE — Patient Instructions (Signed)
     IF you received an x-ray today, you will receive an invoice from [facility]. Please contact [facility] at [phone] with questions or concerns regarding your invoice.   IF you received labwork today, you will receive an invoice from [facility]. Please contact [facility] at [phone] with questions or concerns regarding your invoice.   Our billing staff will not be able to assist you with questions regarding bills from these companies.  You will be contacted with the lab results as soon as they are available. The fastest way to get your results is to activate your My Chart account. Instructions are located on the last page of this paperwork. If you have not heard from us regarding the results in 2 weeks, please contact this office.      

## 2016-10-27 ENCOUNTER — Encounter: Payer: Self-pay | Admitting: Student

## 2016-10-27 ENCOUNTER — Ambulatory Visit (INDEPENDENT_AMBULATORY_CARE_PROVIDER_SITE_OTHER): Payer: Medicare Other | Admitting: Student

## 2016-10-27 VITALS — BP 120/80 | HR 70 | Ht 69.0 in | Wt 217.2 lb

## 2016-10-27 DIAGNOSIS — E784 Other hyperlipidemia: Secondary | ICD-10-CM

## 2016-10-27 DIAGNOSIS — I25810 Atherosclerosis of coronary artery bypass graft(s) without angina pectoris: Secondary | ICD-10-CM | POA: Diagnosis not present

## 2016-10-27 DIAGNOSIS — I739 Peripheral vascular disease, unspecified: Secondary | ICD-10-CM | POA: Diagnosis not present

## 2016-10-27 DIAGNOSIS — I10 Essential (primary) hypertension: Secondary | ICD-10-CM | POA: Diagnosis not present

## 2016-10-27 DIAGNOSIS — E1142 Type 2 diabetes mellitus with diabetic polyneuropathy: Secondary | ICD-10-CM

## 2016-10-27 DIAGNOSIS — E7849 Other hyperlipidemia: Secondary | ICD-10-CM

## 2016-10-27 MED ORDER — NITROGLYCERIN 0.4 MG SL SUBL: 0.4000 mg | SUBLINGUAL_TABLET | SUBLINGUAL | 2 refills | Status: DC | PRN

## 2016-10-27 NOTE — Patient Instructions (Signed)
Medication Instructions:  Your physician recommends that you continue on your current medications as directed. Please refer to the Current Medication list given to you today.  We sent in a prescription for Nitroglycerin to your   Labwork: None ordered  Testing/Procedures: None ordered  Follow-Up: Your physician wants you to follow-up in: [place] DR. CRENSHAW   You will receive a reminder letter in the mail two months in advance. If you don't receive a letter, please call our office to schedule the follow-up appointment.   Any Other Special Instructions Will Be Listed Below (If Applicable).   If you need a refill on your cardiac medications before your next appointment, please call your pharmacy.

## 2016-10-27 NOTE — Progress Notes (Signed)
Cardiology Office Note    Date:  10/27/2016   ID:  Steven Kindred., DOB 06-Dec-1955, MRN [id number]  PCP:  Marton Redwood, MD  Cardiologist: Dr. Stanford Breed PV: Dr. Fletcher Anon  Chief Complaint  Patient presents with  . Follow-up    ER follow-up.     History of Present Illness:    Steven Prabhu. is a 60 y.o. male with past medical history of CAD (s/p CABG in 2011 w/ LIMA-LAD, SVG-OM1, SVG-distal RCA-OM2, cath 12/2015 showing patent SVG-OM2, occluded SVG-RCA, atretic LIMA-LAD with nonobstructive disease in the LAD and chronically occluded RCA with good collateral flow ), PAD (s/p bilateral SFA stenting), HTN, HLD, and pancreatic cancer who presents to the office today for hospital follow-up.   Was recently seen in the ER on 10/13/2016 for evaluation of chest pain. He described a shooting pain lasting for approximately 45 minutes which spontaneously resolved. Reported this felt different from previous anginal pain. He reported taking too many medications the day prior including Testosterone and a new multivitamin. Had been able to perform daily aerobic activities without any symptoms. His EKG was without acute ischemic changes with initial troponin and delta troponin values being negative.   In talking with the patient today, he denies any repeat episodes of chest discomfort since his recent ER visit. Reports being able to perform water aerobics multiple times per week without any symptoms. Denies any dyspnea with exertion, palpitations, orthopnea, or PND.  He does report having numbness and tingling along his lower extremities. Says this is partially relieved with his Gabapentin, that worsens at night time. This is improved with activity, not consistent with his previous claudication symptoms.   Past Medical History:  Diagnosis Date  . Anemia, unspecified   . Bell's palsy   . Blood transfusion    during treatment for Ca  . Blood transfusion without reported diagnosis   . CAD (coronary  artery disease)    a. s/p multiple PCIs;  b. s/p CABG in 09/2010 (LIMA-LAD, SVG-OM1, SVG-distal RCA/OM2);  Cardiac cath in 12/2015: Mild LAD disease, occluded mid LCX and proximal RCA (left to right collaterals), Occluded SVG to RCA and atretic LIMA. Patent SVG to OM  . Chronic back pain   . DJD (degenerative joint disease)    low back & all over   . GERD (gastroesophageal reflux disease)   . Helicobacter pylori (H. pylori) infection   . Hemorrhoids   . Hyperlipidemia   . Hypertension   . Impotence of organic origin   . Myocardial infarction    2005 and 2012   . PAD (peripheral artery disease) ([place])    a. s/p bilat SFA stents;  b. ABIs 11/2012: R 0.99, L 0.86.  [name] Pancreatic cancer [facility])    surgery 2009  . Pre-diabetes 08/16/2015    Past Surgical History:  Procedure Laterality Date  . ABDOMINAL AORTAGRAM N/A 09/13/2013   Procedure: ABDOMINAL Maxcine Ham;  Surgeon: Wellington Hampshire, MD;  Location: [place] CATH LAB;  Service: Cardiovascular;  Laterality: N/A;  . ABDOMINAL AORTAGRAM N/A 08/29/2014   Procedure: ABDOMINAL Maxcine Ham;  Surgeon: Wellington Hampshire, MD;  Location: [place] CATH LAB;  Service: Cardiovascular;  Laterality: N/A;  . BACK SURGERY     1992  . CARDIAC CATHETERIZATION N/A 12/25/2015   Procedure: Left Heart Cath and Cors/Grafts Angiography;  Surgeon: Wellington Hampshire, MD;  Location: [place] CV LAB;  Service: Cardiovascular;  Laterality: N/A;  . CORONARY ARTERY BYPASS GRAFT  nov 2011   x 4  .  HEMORRHOID SURGERY  10/30/2011   Procedure: HEMORRHOIDECTOMY;  Surgeon: Harl Bowie, MD;  Location: [place];  Service: General;  Laterality: N/A;  . pancreatic  cancer  05/10/2008   Resection of distal panrease and spleen  . PARTIAL KNEE ARTHROPLASTY Right 11/19/2014   Procedure: RIGHT UNI-KNEE ARTHROPLASTY MEDIALLY;  Surgeon: Mauri Pole, MD;  [facility];  Service: Orthopedics;  Laterality: Right;  . PERIPHERAL VASCULAR CATHETERIZATION  Oct 2014   Lt SFA PTA  . PERIPHERAL  VASCULAR CATHETERIZATION  Oct 2015   ISR Lt SFA-PTA  . right partial knee replacement    . splenectomy    . THROMBECTOMY FEMORAL ARTERY Left 09/13/2013   Procedure: THROMBECTOMY FEMORAL ARTERY;  Surgeon: Wellington Hampshire, MD;  Location: [place] CATH LAB;  Service: Cardiovascular;  Laterality: Left;    Current Medications: Outpatient Medications Prior to Visit  Medication Sig Dispense Refill  . aspirin EC 81 MG tablet Take 1 tablet (81 mg total) by mouth daily.    [name] atorvastatin (LIPITOR) 40 MG tablet Take 1 tablet (40 mg total) by mouth daily. 90 tablet 3  . fish oil-omega-3 fatty acids 1000 MG capsule Take 1 capsule (1 g total) by mouth daily. 60 capsule 1  . gabapentin (NEURONTIN) 300 MG capsule Take 600 mg by mouth 3 (three) times daily.   2  . isosorbide mononitrate (IMDUR) 30 MG 24 hr tablet Take 1 tablet (30 mg total) by mouth daily. 90 tablet 3  . lisinopril (PRINIVIL,ZESTRIL) 5 MG tablet TAKE 0.5 TABLETS BY MOUTH  DAILY. 15 tablet 0  . metFORMIN (GLUCOPHAGE) 500 MG tablet TAKE 1 TABLET BY MOUTH 2 TIMES DAILY WITH A MEAL. 180 tablet 3  . metoprolol tartrate (LOPRESSOR) 25 MG tablet Take 1 tablet by mouth two  times daily 60 tablet 0  . Multiple Vitamin (MULTIVITAMIN) tablet Take 1 tablet by mouth daily. 30 tablet 10  . meloxicam (MOBIC) 7.5 MG tablet Take 1 tablet (7.5 mg total) by mouth daily. (Patient not taking: Reported on 10/27/2016) 30 tablet 0  . Multiple Vitamins-Minerals (MULTIVITAMIN WITH MINERALS) tablet Take 1 tablet by mouth daily.     No facility-administered medications prior to visit.      Allergies:   Patient has no known allergies.   Social History   Social History  . Marital status: Single    Spouse name: N/A  . Number of children: 0  . Years of education: N/A   Occupational History  . Diability Unemployed   Social History Main Topics  . Smoking status: Former Smoker    Types: Cigarettes    Quit date: 11/29/2008  . Smokeless tobacco: Never Used  .  Alcohol use 1.8 oz/week    3 Shots of liquor per week     Comment: 2 drinks of brandy every week   . Drug use: No  . Sexual activity: Not Currently   Other Topics Concern  . 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   Review of Systems:   Please see the history of present illness.     General:  No chills, fever, night sweats or weight changes.  Cardiovascular:  No chest pain, dyspnea on exertion, edema, orthopnea, palpitations, paroxysmal nocturnal dyspnea. Dermatological: No rash, lesions/masses Respiratory:  No cough, dyspnea Urologic: No hematuria, dysuria Abdominal:   No nausea, vomiting, diarrhea, bright red blood per rectum, melena, or hematemesis Neurologic:  No visual changes, wkns, changes in mental status. Positive for numbness and tingling along lower extremities bilaterally.  All other systems reviewed and are otherwise negative except as noted above.   Physical Exam:    VS:  BP 120/80   Pulse 70   Ht '5\' 9"'$  (1.753 m)   Wt 217 lb 3.2 oz (98.5 kg)   SpO2 95%   BMI 32.07 kg/m    General: Well developed, well nourished [place] American male appearing in no acute distress. Head: Normocephalic, atraumatic, sclera non-icteric, no xanthomas, nares are without discharge.  Neck: No carotid bruits. JVD not elevated.  Lungs: Respirations regular and unlabored, without wheezes or rales.  Heart: Regular rate and rhythm. No S3 or S4.  No murmur, no rubs, or gallops appreciated. Abdomen: Soft, non-tender, non-distended with normoactive bowel sounds. No hepatomegaly. No rebound/guarding. No obvious abdominal masses. Msk:  Strength and tone appear normal for age. No joint deformities or effusions. Extremities: No clubbing or cyanosis. No edema.  Distal pedal pulses are 2+  bilaterally. Neuro: Alert and oriented X 3. Moves all extremities spontaneously. No focal deficits noted. Psych:  Responds to questions appropriately with a normal affect. Skin: No rashes or lesions noted  Wt Readings from Last 3 Encounters:  10/27/16 217 lb 3.2 oz (98.5 kg)  10/13/16 216 lb (98 kg)  10/13/16 216 lb (98 kg)     Studies/Labs Reviewed:   EKG:  EKG is not ordered today.   Recent Labs: 12/04/2015: TSH 1.549 08/20/2016: ALT 21 10/13/2016: BUN 8; Creatinine, Ser 1.07; Hemoglobin 12.2; Platelets 275; Potassium 3.9; Sodium 137   Lipid Panel    Component Value Date/Time   CHOL 147 08/17/2016 0925   TRIG 138 08/17/2016 0925   HDL 46 08/17/2016 0925   CHOLHDL 3.2 08/17/2016 0925   VLDL 28 08/17/2016 0925   LDLCALC 73 08/17/2016 0925   LDLDIRECT 80 11/06/2013 0925    Additional studies/ records that were reviewed today include:   Cardiac Catheterization: 12/25/2015  Prox Cx to Mid Cx lesion, 99% stenosed. The lesion was previously treated with a stent (unknown type).  Prox LAD to Mid LAD lesion, 30% stenosed.  Prox RCA lesion, 100% stenosed.  SVG was injected is normal in caliber, and is anatomically normal.  SVG was injected .  Origin lesion, 100% stenosed.  The left ventricular systolic function is normal.  1. Significant 2 vessel coronary artery disease with patent SVG to OM 2. SVG to RCA is occluded and LIMA to LAD is atretic. However, there is no obstructive disease affecting the LAD system. The right coronary artery is chronically occluded with good left-to-right collaterals.  2. Normal LV systolic function and mildly elevated left ventricular end-diastolic pressure.  Recommendations: Continue aggressive medical therapy. The RCA does not appear favorable for CTO PCI.   Assessment:    1. Coronary artery disease involving coronary bypass graft of native heart without angina pectoris   2. PAD (peripheral artery disease) ([place])   3. Essential  hypertension   4. Other hyperlipidemia   5. DM type 2 with diabetic peripheral neuropathy ([place])      Plan:   In order of problems listed above:  1. CAD without angina pectoris - has known CAD, s/p CABG in 2011 w/ LIMA-LAD, SVG-OM1, SVG-distal RCA-OM2 and recent cath in 12/2015 showing patent SVG-OM2, occluded SVG-RCA, atretic LIMA-LAD with  nonobstructive disease in the LAD and chronically occluded RCA with good collateral flow.  - recently seen in the ER on 10/13/2016 for evaluation of chest pain. Overall, his symptoms were thought to be atypical for a cardiac etiology. EKG was without acute ischemic changes with initial troponin and delta troponin values being negative.  - he denies any repeat episodes of chest discomfort since his recent ER visit. No dyspnea with exertion, palpitations, orthopnea, or PND. - overall, he appears to be doing well from a cardiac perspective. With his resolved symptoms and recent cath, will not pursue further ischemic evaluation at this time. Will provide Rx for SL NTG.   2. PAD - s/p bilateral SFA stenting. He denies any recurrent claudication symptoms.  - followed by Dr. Fletcher Anon.   3. Essential HTN - BP well-controlled at 120/80 today.  - continue Imdur '30mg'$  daily, Lisinopril '5mg'$  daily, and Lopressor '25mg'$  BID.   4. HLD - continue statin therapy.   5. Type 2 DM with peripheral neuropathy - report significant numbness and tingling along his lower extremities mostly occurring at night.  - continue Neurontin '600mg'$  TID. Informed the patient he could take an additional '300mg'$  tablet at night to see if this helps his symptoms, however studies have shown > '1800mg'$  daily have not been associated with greater benefits.     Medication Adjustments/Labs and Tests Ordered: Current medicines are reviewed at length with the patient today.  Concerns regarding medicines are outlined above.  Medication changes, Labs and Tests ordered today are listed in the Patient  Instructions below. Patient Instructions  Medication Instructions:  Your physician recommends that you continue on your current medications as directed. Please refer to the Current Medication list given to you today.  We sent in a prescription for Nitroglycerin to your pharmacy.   Labwork: None ordered  Testing/Procedures: None ordered  Follow-Up: Your physician wants you to follow-up in: [place] DR. CRENSHAW   You will receive a reminder letter in the mail two months in advance. If you don't receive a letter, please call our office to schedule the follow-up appointment.   Any Other Special Instructions Will Be Listed Below (If Applicable).   If you need a refill on your cardiac medications before your next appointment, please call your pharmacy.   Signed, Erma Heritage, PA  10/27/2016 4:53 PM    [address], [place], [place]  [address] Phone: [phone]; Fax: [phone]  [address] [place], [place] [address] Phone: [phone]

## 2016-10-28 ENCOUNTER — Other Ambulatory Visit: Payer: Self-pay

## 2016-10-28 MED ORDER — NITROGLYCERIN 0.4 MG SL SUBL: 0.4000 mg | SUBLINGUAL_TABLET | SUBLINGUAL | 2 refills | Status: DC | PRN

## 2016-11-03 ENCOUNTER — Other Ambulatory Visit: Payer: Self-pay | Admitting: Cardiovascular Disease

## 2016-11-03 NOTE — Telephone Encounter (Signed)
Review for refill. 

## 2016-12-19 IMAGING — CR DG CHEST 2V
2 series · 2 of 2 positions shown · non-contrast
Comparison: 10/19/2011.

CLINICAL DATA: Hypertension.

EXAM:
CHEST  2 VIEW

[w chest pa]
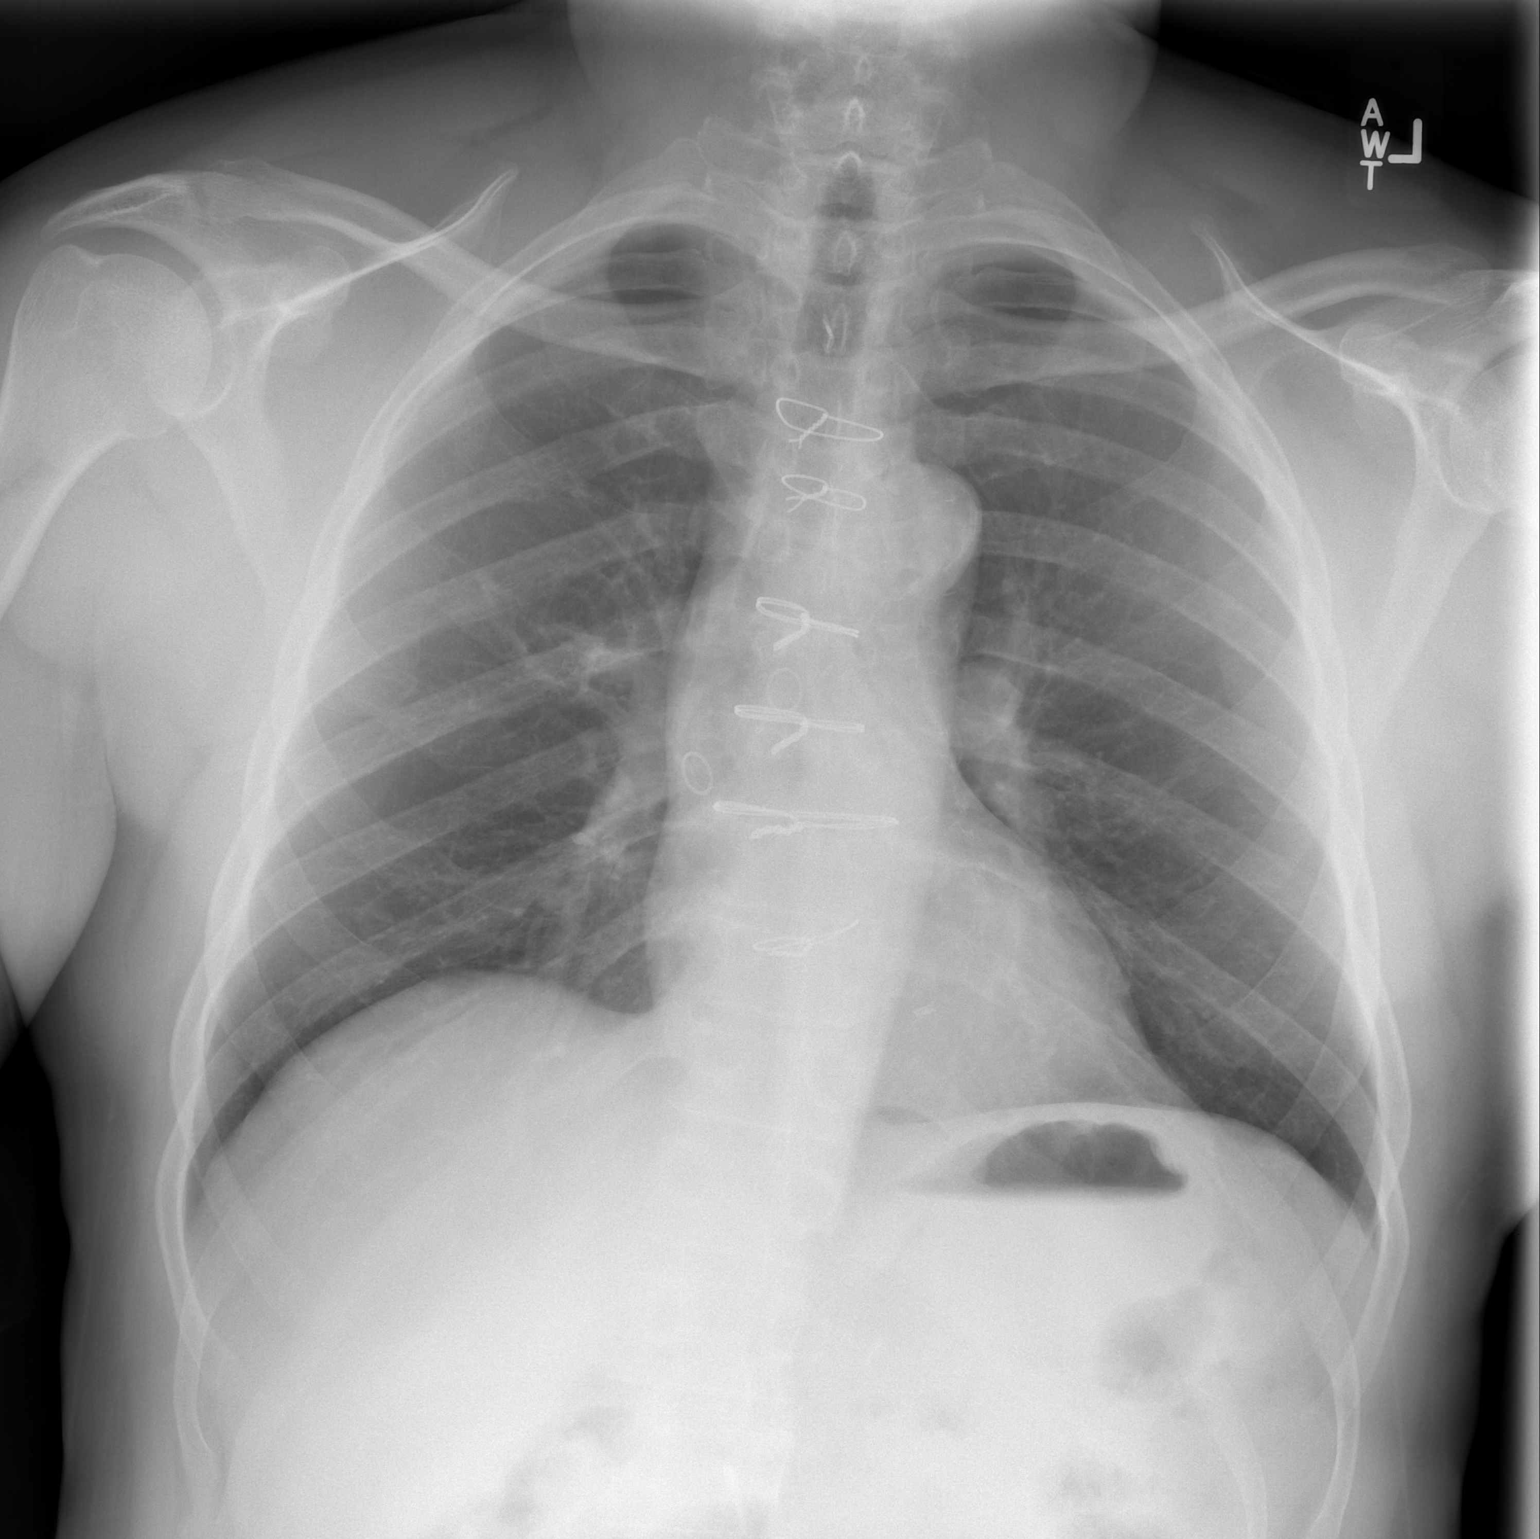

[w chest lat]
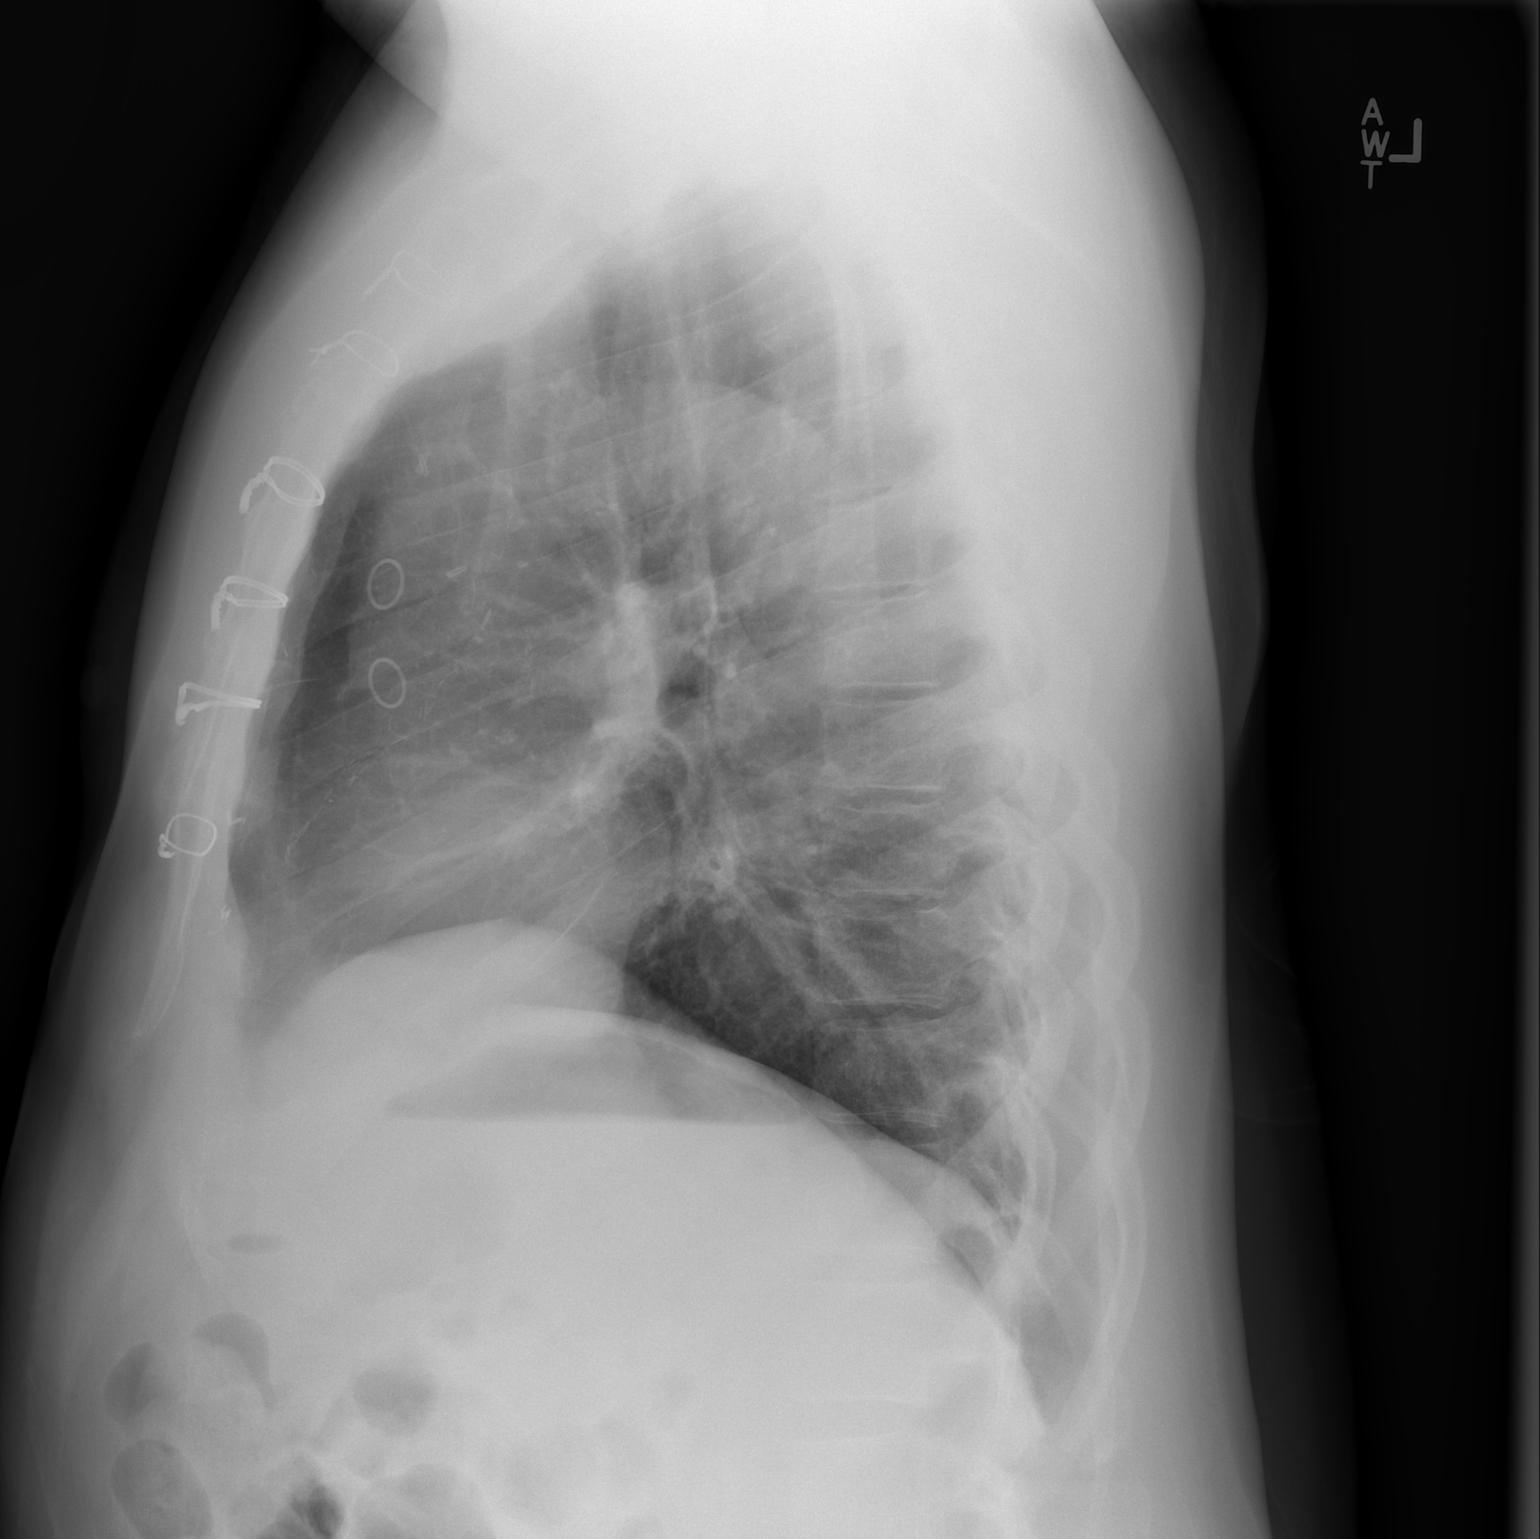

[2 of 2 positions shown; findings below may reference images not displayed]

FINDINGS: Mediastinum hilar structures normal. Prior CABG. Heart size normal.
Lungs are clear. No pleural effusion or pneumothorax. No acute bony
abnormality.
IMPRESSION: 1. Prior CABG.  Heart size normal.
2. No acute cardiopulmonary disease.

## 2016-12-23 ENCOUNTER — Ambulatory Visit: Payer: Medicare Other | Admitting: Family Medicine

## 2016-12-28 ENCOUNTER — Encounter (HOSPITAL_COMMUNITY): Payer: Self-pay | Admitting: Emergency Medicine

## 2016-12-28 ENCOUNTER — Emergency Department (HOSPITAL_COMMUNITY)
Admission: EM | Admit: 2016-12-28 | Discharge: 2016-12-28 | Disposition: A | Discharge status: Home / Self Care | Payer: Medicare Other | Attending: Dermatology | Admitting: Dermatology

## 2016-12-28 DIAGNOSIS — Z5321 Procedure and treatment not carried out due to patient leaving prior to being seen by health care provider: Secondary | ICD-10-CM | POA: Diagnosis not present

## 2016-12-28 DIAGNOSIS — K0889 Other specified disorders of teeth and supporting structures: Secondary | ICD-10-CM | POA: Insufficient documentation

## 2016-12-28 NOTE — ED Notes (Signed)
Pt not in room with attempt to assess pt.

## 2016-12-28 NOTE — ED Notes (Signed)
Pt not present in room. This Probation officer then called pt name from lobby; no response. Triage RN, Morey Hummingbird and triage NT, Hassell Done verbalize pt was walked to 14 with room assignment.

## 2016-12-28 NOTE — ED Notes (Signed)
Pt not present in room, does not answer when called from lobby, not found throughout department.

## 2016-12-28 NOTE — ED Provider Notes (Signed)
Pt had eloped when I went to the room.   Varney Biles, MD 12/28/16 1702

## 2016-12-28 NOTE — ED Triage Notes (Signed)
patient c/o left lower dental pain that has been going on since Friday. Patient reports that pain got worse last night. Patient has been taking Aleve and using salt water rinses. patient states that when he was using oragel last night felt like it was going down into a hole.

## 2017-01-13 ENCOUNTER — Other Ambulatory Visit: Payer: Self-pay | Admitting: Cardiovascular Disease

## 2017-01-13 MED ORDER — ISOSORBIDE MONONITRATE ER 30 MG PO TB24: 30.0000 mg | ORAL_TABLET | Freq: Every day | ORAL | 3 refills | Status: DC

## 2017-03-16 ENCOUNTER — Ambulatory Visit (INDEPENDENT_AMBULATORY_CARE_PROVIDER_SITE_OTHER): Payer: Medicare Other | Admitting: Cardiovascular Disease

## 2017-03-16 VITALS — BP 118/82 | HR 59 | Ht 69.0 in | Wt 212.0 lb

## 2017-03-16 DIAGNOSIS — E784 Other hyperlipidemia: Secondary | ICD-10-CM

## 2017-03-16 DIAGNOSIS — E7849 Other hyperlipidemia: Secondary | ICD-10-CM

## 2017-03-16 DIAGNOSIS — I25118 Atherosclerotic heart disease of native coronary artery with other forms of angina pectoris: Secondary | ICD-10-CM

## 2017-03-16 DIAGNOSIS — I739 Peripheral vascular disease, unspecified: Secondary | ICD-10-CM

## 2017-03-16 DIAGNOSIS — I10 Essential (primary) hypertension: Secondary | ICD-10-CM

## 2017-03-16 DIAGNOSIS — K219 Gastro-esophageal reflux disease without esophagitis: Secondary | ICD-10-CM

## 2017-03-16 MED ORDER — PANTOPRAZOLE SODIUM 40 MG PO TBEC: 40.0000 mg | DELAYED_RELEASE_TABLET | Freq: Every day | ORAL | 11 refills | Status: DC

## 2017-03-16 NOTE — Patient Instructions (Signed)
Medication Instructions:   START PROTONIX 40 MG ONCE DAILY  Testing/Procedures:  Your physician has requested that you have a lower extremity arterial duplex. During this test, ultrasound are used to evaluate arterial blood flow in the legs. Allow one hour for this exam. There are no restrictions or special instructions.   Follow-Up:  Your physician wants you to follow-up in: [place] will receive a reminder letter in the mail two months in advance. If you don't receive a letter, please call our office to schedule the follow-up appointment.   If you need a refill on your cardiac medications before your next appointment, please call your pharmacy.

## 2017-03-16 NOTE — Progress Notes (Signed)
Cardiology Office Note   Date:  03/16/2017   ID:  Steven Hale., DOB 1956/08/13, MRN [id number]  PCP:  Marton Redwood, MD  Cardiologist:  Dr. Jamesetta Geralds  Chief Complaint  Patient presents with  . Follow-up    6 month, pt c/o chest pain and SOB, he also had outbreak in sweat 3 weeks ago.      History of Present Illness: Steven Hale. is a 61 y.o. male who presents for a followup visit regarding peripheral arterial disease and coronary artery disease. He has known history of coronary artery disease with multiple interventions in the past. He had CABG in 2011.  He quit smoking in 2009.  He has known history of peripheral arterial disease with intervention on both SFAs.  He had mild angina and abnormal stress test earlier this year. Cardiac catheterization in January, 2017 showed mild nonobstructive LAD disease, occluded mid left circumflex and occluded proximal right coronary artery with left-to-right collaterals. SVG to RCA was occluded and LIMA to LAD was atretic. SVG to OM was normal. Abnormal stress test was due to chronically occluded right coronary artery and graft with left-to-right collaterals. His native RCA was not favorable for CTO PCI. I recommended medical therapy. Imdur was added.   He has been doing reasonably well. However, he reports intermittent episodes of substernal chest pain described as burning sensation with abdominal bloating that happens mostly at night with associated sweating. He has no exertional symptoms other than mild dyspnea which has been stable. He feels a bad taste in his mouth. Symptoms are worse with lying down. He reports no leg claudication.  Past Medical History:  Diagnosis Date  . Anemia, unspecified   . Bell's palsy   . Blood transfusion    during treatment for Ca  . Blood transfusion without reported diagnosis   . CAD (coronary artery disease)    a. s/p multiple PCIs;  b. s/p CABG in 09/2010 (LIMA-LAD, SVG-OM1, SVG-distal  RCA/OM2);  Cardiac cath in 12/2015: Mild LAD disease, occluded mid LCX and proximal RCA (left to right collaterals), Occluded SVG to RCA and atretic LIMA. Patent SVG to OM  . Chronic back pain   . DJD (degenerative joint disease)    low back & all over   . GERD (gastroesophageal reflux disease)   . Helicobacter pylori (H. pylori) infection   . Hemorrhoids   . Hyperlipidemia   . Hypertension   . Impotence of organic origin   . Myocardial infarction    2005 and 2012   . PAD (peripheral artery disease) ([place])    a. s/p bilat SFA stents;  b. ABIs 11/2012: R 0.99, L 0.86.  [name] Pancreatic cancer [facility])    surgery 2009  . Pre-diabetes 08/16/2015    Past Surgical History:  Procedure Laterality Date  . ABDOMINAL AORTAGRAM N/A 09/13/2013   Procedure: ABDOMINAL Maxcine Ham;  Surgeon: Wellington Hampshire, MD;  Location: [place] CATH LAB;  Service: Cardiovascular;  Laterality: N/A;  . ABDOMINAL AORTAGRAM N/A 08/29/2014   Procedure: ABDOMINAL Maxcine Ham;  Surgeon: Wellington Hampshire, MD;  Location: [place] CATH LAB;  Service: Cardiovascular;  Laterality: N/A;  . BACK SURGERY     1992  . CARDIAC CATHETERIZATION N/A 12/25/2015   Procedure: Left Heart Cath and Cors/Grafts Angiography;  Surgeon: Wellington Hampshire, MD;  Location: [place] CV LAB;  Service: Cardiovascular;  Laterality: N/A;  . CORONARY ARTERY BYPASS GRAFT  nov 2011   x 4  . HEMORRHOID SURGERY  10/30/2011  Procedure: HEMORRHOIDECTOMY;  Surgeon: Harl Bowie, MD;  Location: [place];  Service: General;  Laterality: N/A;  . pancreatic  cancer  05/10/2008   Resection of distal panrease and spleen  . PARTIAL KNEE ARTHROPLASTY Right 11/19/2014   Procedure: RIGHT UNI-KNEE ARTHROPLASTY MEDIALLY;  Surgeon: Mauri Pole, MD;  [facility];  Service: Orthopedics;  Laterality: Right;  . PERIPHERAL VASCULAR CATHETERIZATION  Oct 2014   Lt SFA PTA  . PERIPHERAL VASCULAR CATHETERIZATION  Oct 2015   ISR Lt SFA-PTA  . right partial knee replacement    .  splenectomy    . THROMBECTOMY FEMORAL ARTERY Left 09/13/2013   Procedure: THROMBECTOMY FEMORAL ARTERY;  Surgeon: Wellington Hampshire, MD;  Location: [place] CATH LAB;  Service: Cardiovascular;  Laterality: Left;     Current Outpatient Prescriptions  Medication Sig Dispense Refill  . aspirin EC 81 MG tablet Take 1 tablet (81 mg total) by mouth daily.    [name] atorvastatin (LIPITOR) 40 MG tablet Take 1 tablet (40 mg total) by mouth daily. 90 tablet 3  . fish oil-omega-3 fatty acids 1000 MG capsule Take 1 capsule (1 g total) by mouth daily. 60 capsule 1  . gabapentin (NEURONTIN) 300 MG capsule Take 600 mg by mouth 3 (three) times daily.   2  . isosorbide mononitrate (IMDUR) 30 MG 24 hr tablet Take 1 tablet (30 mg total) by mouth daily. 90 tablet 3  . lisinopril (PRINIVIL,ZESTRIL) 5 MG tablet TAKE 0.5 TABLETS BY MOUTH  DAILY. 15 tablet 0  . metFORMIN (GLUCOPHAGE) 500 MG tablet TAKE 1 TABLET BY MOUTH 2 TIMES DAILY WITH A MEAL. 180 tablet 3  . metoprolol tartrate (LOPRESSOR) 25 MG tablet Take 1 tablet by mouth two  times daily 60 tablet 0  . Multiple Vitamin (MULTIVITAMIN) tablet Take 1 tablet by mouth daily. 30 tablet 10  . nitroGLYCERIN (NITROSTAT) 0.4 MG SL tablet Place 1 tablet (0.4 mg total) under the tongue every 5 (five) minutes as needed for chest pain. 25 tablet 2   No current facility-administered medications for this visit.     Allergies:   Patient has no known allergies.    Social History:  The patient  reports that he quit smoking about 8 years ago. His smoking use included Cigarettes. He has never used smokeless tobacco. He reports that he drinks about 1.8 oz of alcohol per week . He reports that he does not use drugs.   Family History:  The patient's family history includes Cancer in his father; Heart attack in his father.    ROS:  Please see the history of present illness.   Otherwise, review of systems are positive for none.   All other systems are reviewed and negative.    PHYSICAL  EXAM: VS:  BP 118/82   Pulse (!) 59   Ht '5\' 9"'$  (1.753 m)   Wt 212 lb (96.2 kg)   BMI 31.31 kg/m  , BMI Body mass index is 31.31 kg/m. GEN: Well nourished, well developed, in no acute distress  HEENT: normal  Neck: no JVD, carotid bruits, or masses Cardiac: RRR; no murmurs, rubs, or gallops,no edema  Respiratory:  clear to auscultation bilaterally, normal work of breathing GI: soft, nontender, nondistended, + BS MS: no deformity or atrophy  Skin: warm and dry, no rash Neuro:  Strength and sensation are intact Psych: euthymic mood, full affect Distal pulses are not palpable.  EKG:  EKG is ordered today. EKG showed sinus bradycardia with a heart rate of  59 bpm. No significant ST or T wave changes.   Recent Labs: 08/20/2016: ALT 21 10/13/2016: BUN 8; Creatinine, Ser 1.07; Hemoglobin 12.2; Platelets 275; Potassium 3.9; Sodium 137    Lipid Panel    Component Value Date/Time   CHOL 147 08/17/2016 0925   TRIG 138 08/17/2016 0925   HDL 46 08/17/2016 0925   CHOLHDL 3.2 08/17/2016 0925   VLDL 28 08/17/2016 0925   LDLCALC 73 08/17/2016 0925   LDLDIRECT 80 11/06/2013 0925      Wt Readings from Last 3 Encounters:  03/16/17 212 lb (96.2 kg)  10/27/16 217 lb 3.2 oz (98.5 kg)  10/13/16 216 lb (98 kg)        ASSESSMENT AND PLAN:  1.  GERD: The patient's symptoms are highly suggestive of GERD. I added Protonix 40 mg once daily and advised him to follow-up with primary care or GI if there is no improvement after a few weeks.  2. Peripheral arterial disease: Previous bilateral SFA intervention. No significant claudication but distal pulses are nonpalpable. I requested lower extremity arterial duplex.  3. Coronary artery disease involving native coronary arteries with stable angina: I think his current symptoms are due to GERD and not angina. EKG does not show any acute changes. Continue treatment with metoprolol and Imdur.  4. Essential hypertension: Blood pressure is controlled  on current medications.  5. Hyperlipidemia:  Continue treatment with atorvastatin 40 mg once daily. Most recent LDL was 72 which is close to target.   Disposition:   FU with me in 6 months  Signed,  Kathlyn Sacramento, MD  03/16/2017 10:35 AM    [place]

## 2017-03-17 ENCOUNTER — Encounter: Payer: Self-pay | Admitting: Urgent Care

## 2017-03-17 ENCOUNTER — Ambulatory Visit (INDEPENDENT_AMBULATORY_CARE_PROVIDER_SITE_OTHER): Payer: Medicare Other | Admitting: Urgent Care

## 2017-03-17 VITALS — BP 119/75 | HR 65 | Temp 98.6°F | Resp 17 | Ht 69.0 in | Wt 212.0 lb

## 2017-03-17 DIAGNOSIS — R0789 Other chest pain: Secondary | ICD-10-CM | POA: Diagnosis not present

## 2017-03-17 DIAGNOSIS — Z8507 Personal history of malignant neoplasm of pancreas: Secondary | ICD-10-CM | POA: Diagnosis not present

## 2017-03-17 DIAGNOSIS — K219 Gastro-esophageal reflux disease without esophagitis: Secondary | ICD-10-CM

## 2017-03-17 DIAGNOSIS — Z8619 Personal history of other infectious and parasitic diseases: Secondary | ICD-10-CM

## 2017-03-17 DIAGNOSIS — Z951 Presence of aortocoronary bypass graft: Secondary | ICD-10-CM | POA: Diagnosis not present

## 2017-03-17 DIAGNOSIS — I25119 Atherosclerotic heart disease of native coronary artery with unspecified angina pectoris: Secondary | ICD-10-CM | POA: Diagnosis not present

## 2017-03-17 DIAGNOSIS — I252 Old myocardial infarction: Secondary | ICD-10-CM

## 2017-03-17 DIAGNOSIS — R140 Abdominal distension (gaseous): Secondary | ICD-10-CM

## 2017-03-17 DIAGNOSIS — D649 Anemia, unspecified: Secondary | ICD-10-CM

## 2017-03-17 LAB — POCT CBC
GRANULOCYTE PERCENT: 43.8 % (ref 37–80)
HEMATOCRIT: 37.7 % — AB (ref 43.5–53.7)
HEMOGLOBIN: 13 g/dL — AB (ref 14.1–18.1)
Lymph, poc: 2.7 (ref 0.6–3.4)
MCH: 30.4 pg (ref 27–31.2)
MCHC: 34.6 g/dL (ref 31.8–35.4)
MCV: 87.7 fL (ref 80–97)
MID (cbc): 0.3 (ref 0–0.9)
MPV: 8.1 fL (ref 0–99.8)
PLATELET COUNT, POC: 323 10*3/uL (ref 142–424)
POC GRANULOCYTE: 2.3 (ref 2–6.9)
POC LYMPH PERCENT: 51.1 %L — AB (ref 10–50)
POC MID %: 5.1 %M (ref 0–12)
RBC: 4.3 M/uL — AB (ref 4.69–6.13)
RDW, POC: 15.5 %
WBC: 5.2 10*3/uL (ref 4.6–10.2)

## 2017-03-17 NOTE — Patient Instructions (Addendum)
Food Choices for Gastroesophageal Reflux Disease, Adult When you have gastroesophageal reflux disease (GERD), the foods you eat and your eating habits are very important. Choosing the right foods can help ease the discomfort of GERD. Consider working with a diet and nutrition specialist (dietitian) to help you make healthy food choices. What general guidelines should I follow? Eating plan   Choose healthy foods low in fat, such as fruits, vegetables, whole grains, low-fat dairy products, and lean meat, fish, and poultry.  Eat frequent, small meals instead of three large meals each day. Eat your meals slowly, in a relaxed setting. Avoid bending over or lying down until 2-3 hours after eating.  Limit high-fat foods such as fatty meats or fried foods.  Limit your intake of oils, butter, and shortening to less than 8 teaspoons each day.  Avoid the following:  Foods that cause symptoms. These may be different for different people. Keep a food diary to keep track of foods that cause symptoms.  Alcohol.  Drinking large amounts of liquid with meals.  Eating meals during the 2-3 hours before bed.  Cook foods using methods other than frying. This may include baking, grilling, or broiling. Lifestyle    Maintain a healthy weight. Ask your health care provider what weight is healthy for you. If you need to lose weight, work with your health care provider to do so safely.  Exercise for at least 30 minutes on 5 or more days each week, or as told by your health care provider.  Avoid wearing clothes that fit tightly around your waist and chest.  Do not use any products that contain nicotine or tobacco, such as cigarettes and e-cigarettes. If you need help quitting, ask your health care provider.  Sleep with the head of your bed raised. Use a wedge under the mattress or blocks under the bed frame to raise the head of the bed. What foods are not recommended? The items listed may not be a complete  list. Talk with your dietitian about what dietary choices are best for you. Grains  Pastries or quick breads with added fat. [place] toast. Vegetables  Deep fried vegetables. [place] fries. Any vegetables prepared with added fat. Any vegetables that cause symptoms. For some people this may include tomatoes and tomato products, chili peppers, onions and garlic, and horseradish. Fruits  Any fruits prepared with added fat. Any fruits that cause symptoms. For some people this may include citrus fruits, such as oranges, grapefruit, pineapple, and lemons. Meats and other protein foods  High-fat meats, such as fatty beef or pork, hot dogs, ribs, ham, sausage, salami and bacon. Fried meat or protein, including fried fish and fried chicken. Nuts and nut butters. Dairy  Whole milk and chocolate milk. Sour cream. Cream. Ice cream. Cream cheese. Milk shakes. Beverages  Coffee and tea, with or without caffeine. Carbonated beverages. Sodas. Energy drinks. Fruit juice made with acidic fruits (such as orange or grapefruit). Tomato juice. Alcoholic drinks. Fats and oils  Butter. Margarine. Shortening. Ghee. Sweets and desserts  Chocolate and cocoa. Donuts. Seasoning and other foods  Pepper. Peppermint and spearmint. Any condiments, herbs, or seasonings that cause symptoms. For some people, this may include curry, hot sauce, or vinegar-based salad dressings. Summary  When you have gastroesophageal reflux disease (GERD), food and lifestyle choices are very important to help ease the discomfort of GERD.  Eat frequent, small meals instead of three large meals each day. Eat your meals slowly, in a relaxed setting. Avoid bending over  or lying down until 2-3 hours after eating.  Limit high-fat foods such as fatty meat or fried foods. This information is not intended to replace advice given to you by your health care provider. Make sure you discuss any questions you have with your health care provider. Document  Released: 11/02/2005 Document Revised: 11/03/2016 Document Reviewed: 11/03/2016 Elsevier Interactive Patient Education  2017 [employer].   IF you received an x-ray today, you will receive an invoice from [facility] Radiology. Please contact [facility] Radiology at [phone] with questions or concerns regarding your invoice.   IF you received labwork today, you will receive an invoice from [facility]. Please contact [facility] at [phone] with questions or concerns regarding your invoice.   Our billing staff will not be able to assist you with questions regarding bills from these companies.  You will be contacted with the lab results as soon as they are available. The fastest way to get your results is to activate your My Chart account. Instructions are located on the last page of this paperwork. If you have not heard from [place] regarding the results in 2 weeks, please contact this office.

## 2017-03-17 NOTE — Progress Notes (Signed)
MRN: [id number] DOB: 14-Mar-1956  Subjective:   Steven Hale. is a 61 y.o. male presenting for chief complaint of Gas (onset 7-8 months and now getting worse, alot of bloating) and Gastroesophageal Reflux (per Dr. Fletcher Anon)  Reports several month history of abdominal bloating, mid-sternal to left sided chest pressure/tightness. Has some diaphoresis associated with this, lasts 5-10 minutes, occurs randomly throughout the day and happens with or without eating. Denies chest pain, heart racing, palpitations, n/v, abdominal pain, radiation of pain into back, limb, neck or jaw. He did see Dr. Fletcher Anon, his cardiologist yesterday. Was advised that he has GERD type symptoms and was started on Protonix. Patient is very worried still because this type of symptom reminds him of his previous chest pain when he had an MI. He has used SL nitro with his symptoms and doesn't feel any different with it compared to when he doesn't use anything. Denies smoking cigarettes. Drinks twice per week, has 1-2 drinks per sitting. Has had a colonoscopy in 2016, was placed on 10 year follow up.   Steven Hale has a current medication list which includes the following prescription(s): aspirin ec, atorvastatin, fish oil-omega-3 fatty acids, gabapentin, isosorbide mononitrate, lisinopril, metformin, metoprolol tartrate, multivitamin, pantoprazole, and nitroglycerin. Also has No Known Allergies.  Steven Hale  has a past medical history of Anemia, unspecified; Bell's palsy; Blood transfusion; Blood transfusion without reported diagnosis; CAD (coronary artery disease); Chronic back pain; DJD (degenerative joint disease); GERD (gastroesophageal reflux disease); Helicobacter pylori (H. pylori) infection; Hemorrhoids; Hyperlipidemia; Hypertension; Impotence of organic origin; Myocardial infarction [facility]); PAD (peripheral artery disease) ([place]); Pancreatic cancer ([place]); and Pre-diabetes (08/16/2015). Also  has a past surgical history that includes pancreatic   cancer (05/10/2008); splenectomy; Back surgery; Cardiac catheterization (Oct 2014); Hemorrhoid surgery (10/30/2011); Cardiac catheterization (Oct 2015); abdominal aortagram (N/A, 09/13/2013); Thrombectomy femoral artery (Left, 09/13/2013); abdominal aortagram (N/A, 08/29/2014); Coronary artery bypass graft (nov 2011); Partial knee arthroplasty (Right, 11/19/2014); right partial knee replacement; and Cardiac catheterization (N/A, 12/25/2015).  Objective:   Vitals: BP 119/75 (BP Location: Right Arm, Patient Position: Sitting, Cuff Size: Normal)   Pulse 65   Temp 98.6 F (37 C) (Oral)   Resp 17   Ht '5\' 9"'$  (1.753 m)   Wt 212 lb (96.2 kg)   SpO2 98%   BMI 31.31 kg/m   BP Readings from Last 3 Encounters:  03/17/17 119/75  03/16/17 118/82  12/28/16 143/88   Wt Readings from Last 3 Encounters:  03/17/17 212 lb (96.2 kg)  03/16/17 212 lb (96.2 kg)  10/27/16 217 lb 3.2 oz (98.5 kg)   Physical Exam  Constitutional: He is oriented to person, place, and time. He appears well-developed and well-nourished.  HENT:  Mouth/Throat: Oropharynx is clear and moist.  Eyes: No scleral icterus.  Cardiovascular: Normal rate, regular rhythm and intact distal pulses.  Exam reveals no gallop and no friction rub.   No murmur heard. Pulmonary/Chest: No respiratory distress. He has no wheezes. He has no rales.  Well-healed surgical scar over mid-lower sternum.  Abdominal: Soft. Bowel sounds are normal. He exhibits no distension and no mass. There is no tenderness. There is no guarding.  Well-healed horizontal surgical scar over upper abdomen.  Neurological: He is alert and oriented to person, place, and time.  Skin: Skin is warm and dry.   Results for orders placed or performed in visit on 03/17/17 (from the past 24 hour(s))  POCT CBC     Status: Abnormal   Collection Time: 03/17/17  9:48 AM  Result  Value Ref Range   WBC 5.2 4.6 - 10.2 K/uL   Lymph, poc 2.7 0.6 - 3.4   POC LYMPH PERCENT 51.1 (A) 10 - 50 %L     MID (cbc) 0.3 0 - 0.9   POC MID % 5.1 0 - 12 %M   POC Granulocyte 2.3 2 - 6.9   Granulocyte percent 43.8 37 - 80 %G   RBC 4.30 (A) 4.69 - 6.13 M/uL   Hemoglobin 13.0 (A) 14.1 - 18.1 g/dL   HCT, POC 37.7 (A) 43.5 - 53.7 %   MCV 87.7 80 - 97 fL   MCH, POC 30.4 27 - 31.2 pg   MCHC 34.6 31.8 - 35.4 g/dL   RDW, POC 15.5 %   Platelet Count, POC 323 142 - 424 K/uL   MPV 8.1 0 - 99.8 fL   Assessment and Plan :   1. Gastroesophageal reflux disease, esophagitis presence not specified 2. Abdominal bloating 3. Chest tightness 4. Anemia, unspecified type 5. Coronary artery disease involving native heart with angina pectoris, unspecified vessel or lesion type ([place]) 6. History of MI (myocardial infarction) 7. Hx of CABG 8. History of pancreatic cancer 9. History of Helicobacter infection - Patient was just worked up by his cardiologist. His symptoms have an unclear etiology and he has a very complicated medical history. At this point, a GI consult is appropriate. Labs are pending. Patient is in agreement. Counseled on signs of ACS, patient verbalized understanding and will seek immediate emergency intervention.  Jaynee Eagles, PA-C [facility] [place] [phone] 03/17/2017  9:02 AM

## 2017-03-17 NOTE — Addendum Note (Signed)
Addended by: Milderd Meager on: 03/17/2017 04:21 PM   Modules accepted: Orders

## 2017-03-18 ENCOUNTER — Ambulatory Visit (INDEPENDENT_AMBULATORY_CARE_PROVIDER_SITE_OTHER): Payer: Medicare Other | Admitting: Family Medicine

## 2017-03-18 ENCOUNTER — Other Ambulatory Visit: Payer: Self-pay | Admitting: Urgent Care

## 2017-03-18 ENCOUNTER — Encounter: Payer: Self-pay | Admitting: Family Medicine

## 2017-03-18 VITALS — BP 102/63 | HR 55 | Temp 98.4°F | Resp 16 | Ht 69.0 in | Wt 213.0 lb

## 2017-03-18 DIAGNOSIS — M109 Gout, unspecified: Secondary | ICD-10-CM | POA: Diagnosis not present

## 2017-03-18 LAB — COMPREHENSIVE METABOLIC PANEL
A/G RATIO: 1.8 (ref 1.2–2.2)
ALK PHOS: 91 IU/L (ref 39–117)
ALT: 24 IU/L (ref 0–44)
AST: 22 IU/L (ref 0–40)
Albumin: 5.2 g/dL — ABNORMAL HIGH (ref 3.6–4.8)
BILIRUBIN TOTAL: 0.5 mg/dL (ref 0.0–1.2)
BUN/Creatinine Ratio: 16 (ref 10–24)
BUN: 16 mg/dL (ref 8–27)
CHLORIDE: 98 mmol/L (ref 96–106)
CO2: 27 mmol/L (ref 18–29)
Calcium: 10.5 mg/dL — ABNORMAL HIGH (ref 8.6–10.2)
Creatinine, Ser: 0.98 mg/dL (ref 0.76–1.27)
GFR calc non Af Amer: 83 mL/min/{1.73_m2} (ref >59)
GFR, EST AFRICAN AMERICAN: 96 mL/min/{1.73_m2} (ref >59)
GLUCOSE: 120 mg/dL — AB (ref 65–99)
Globulin, Total: 2.9 g/dL (ref 1.5–4.5)
POTASSIUM: 4.9 mmol/L (ref 3.5–5.2)
Sodium: 140 mmol/L (ref 134–144)
TOTAL PROTEIN: 8.1 g/dL (ref 6.0–8.5)

## 2017-03-18 LAB — H. PYLORI BREATH TEST: H. PYLORI UBIT: NEGATIVE

## 2017-03-18 LAB — LIPASE: Lipase: 23 U/L (ref 13–78)

## 2017-03-18 LAB — FERRITIN: FERRITIN: 144 ng/mL (ref 30–400)

## 2017-03-18 MED ORDER — COLCHICINE 0.6 MG PO TABS
ORAL_TABLET | ORAL | 0 refills | Status: DC
Start: 2017-03-18 — End: 2017-04-02

## 2017-03-18 MED ORDER — IPRATROPIUM BROMIDE 0.02 % IN SOLN: 0.5000 mg | Freq: Once | RESPIRATORY_TRACT | Status: DC

## 2017-03-18 MED ORDER — ALBUTEROL SULFATE (2.5 MG/3ML) 0.083% IN NEBU: 2.5000 mg | INHALATION_SOLUTION | Freq: Once | RESPIRATORY_TRACT | Status: DC

## 2017-03-18 NOTE — Progress Notes (Signed)
Steven Hale. is a 61 y.o. male who presents to Primary Care at [facility] today for swelling Left great toe:  1.  Left great toe swelling:  Started yesterday.  First noticed pain while here, he was being seen for separate issue of GERD.   Pain continued throughout the day. He started noticing swelling and redness in left foot toilet independently. He has been using ice and icy hot with some relief. Today he was unable to put on a shoe. He is walking with flip-flops. Pain when standing or trying to bear weight.  No injury. No history of gout. No family history of gout. No break in the skin. No fevers or chills. No red streaks up his foot or leg.  ROS as above.    PMH reviewed. Patient is a nonsmoker.   Past Medical History:  Diagnosis Date  . Anemia, unspecified   . Bell's palsy   . Blood transfusion    during treatment for Ca  . Blood transfusion without reported diagnosis   . CAD (coronary artery disease)    a. s/p multiple PCIs;  b. s/p CABG in 09/2010 (LIMA-LAD, SVG-OM1, SVG-distal RCA/OM2);  Cardiac cath in 12/2015: Mild LAD disease, occluded mid LCX and proximal RCA (left to right collaterals), Occluded SVG to RCA and atretic LIMA. Patent SVG to OM  . Chronic back pain   . DJD (degenerative joint disease)    low back & all over   . GERD (gastroesophageal reflux disease)   . Helicobacter pylori (H. pylori) infection   . Hemorrhoids   . Hyperlipidemia   . Hypertension   . Impotence of organic origin   . Myocardial infarction ([place])    2005 and 2012   . PAD (peripheral artery disease) ([place])    a. s/p bilat SFA stents;  b. ABIs 11/2012: R 0.99, L 0.86.  [name] Pancreatic cancer [facility])    surgery 2009  . Pre-diabetes 08/16/2015   Past Surgical History:  Procedure Laterality Date  . ABDOMINAL AORTAGRAM N/A 09/13/2013   Procedure: ABDOMINAL Maxcine Ham;  Surgeon: Wellington Hampshire, MD;  Location: [place] CATH LAB;  Service: Cardiovascular;  Laterality: N/A;  . ABDOMINAL AORTAGRAM N/A  08/29/2014   Procedure: ABDOMINAL Maxcine Ham;  Surgeon: Wellington Hampshire, MD;  Location: [place] CATH LAB;  Service: Cardiovascular;  Laterality: N/A;  . BACK SURGERY     1992  . CARDIAC CATHETERIZATION N/A 12/25/2015   Procedure: Left Heart Cath and Cors/Grafts Angiography;  Surgeon: Wellington Hampshire, MD;  Location: [place] CV LAB;  Service: Cardiovascular;  Laterality: N/A;  . CORONARY ARTERY BYPASS GRAFT  nov 2011   x 4  . HEMORRHOID SURGERY  10/30/2011   Procedure: HEMORRHOIDECTOMY;  Surgeon: Harl Bowie, MD;  Location: [place];  Service: General;  Laterality: N/A;  . pancreatic  cancer  05/10/2008   Resection of distal panrease and spleen  . PARTIAL KNEE ARTHROPLASTY Right 11/19/2014   Procedure: RIGHT UNI-KNEE ARTHROPLASTY MEDIALLY;  Surgeon: Mauri Pole, MD;  [facility] OR[facility];  Service: Orthopedics;  Laterality: Right;  . PERIPHERAL VASCULAR CATHETERIZATION  Oct 2014   Lt SFA PTA  . PERIPHERAL VASCULAR CATHETERIZATION  Oct 2015   ISR Lt SFA-PTA  . right partial knee replacement    . splenectomy    . THROMBECTOMY FEMORAL ARTERY Left 09/13/2013   Procedure: THROMBECTOMY FEMORAL ARTERY;  Surgeon: Wellington Hampshire, MD;  Location: [place] CATH LAB;  Service: Cardiovascular;  Laterality: Left;    Medications reviewed.  Physical Exam:  BP 102/63   Pulse (!) 55   Temp 98.4 F (36.9 C) (Oral)   Resp 16   Ht '5\' 9"'$  (1.753 m)   Wt 213 lb (96.6 kg)   SpO2 99%   BMI 31.45 kg/m  Gen:  Alert, cooperative patient who appears stated age in no acute distress.  Vital signs reviewed. HEENT: EOMI,  MMM Ext:  Right foot WNL - Left foot:  Podagra of left great toe -- swelling and redness of 1st MTP.  Rest of foot completely within normal limits.  No redness or tenderness to palpation for the rest of his MTP joints. Strength is 5 out of 5 dorsi and plantar flexion. He has good cap refill and distal portion of great toe. Good sensation here as well. No breaks in the skin. No evidence of abscess.  No fluctuance MTP joint.  Assessment and Plan:  1.  Gout: - treating as such with colchicine - no evidence of cellulitis - see instructions.  - FU if no improvement.  Renal function checked yesterday, looks great.   - First attack.  If recurrent, consider prophylaxis.

## 2017-03-18 NOTE — Patient Instructions (Addendum)
It was good to meet you today.  You do have gout in that toe. That is why you're having some much pain.  The treatment for this is colchicine. This will both treat the gout and help with the pain. Take it as follows:  Take 2 pills as soon as you pick up the medicine, then 1 more pill an hour later.   After that, start taking the Colchicine 1 pill twice daily until pain or redness resolves.    You can also keep the foot elevated and use ice to help some of the swelling.  You should notice a difference after 24 hours with colchicine. He will probably still take a few days to get this to completely go away.  You can stop the colchicine once the redness and pain are gone.  If you continue having symptoms despite the medicine, come back and see [place].   Gout Gout is painful swelling that can happen in some of your joints. Gout is a type of arthritis. This condition is caused by having too much uric acid in your body. Uric acid is a chemical that is made when your body breaks down substances called purines. If your body has too much uric acid, sharp crystals can form and build up in your joints. This causes pain and swelling. Gout attacks can happen quickly and be very painful (acute gout). Over time, the attacks can affect more joints and happen more often (chronic gout). Follow these instructions at home: During a Gout Attack   If directed, put ice on the painful area:  Put ice in a plastic bag.  Place a towel between your skin and the bag.  Leave the ice on for 20 minutes, 2-3 times a day.  Rest the joint as much as possible. If the joint is in your leg, you may be given crutches to use.  Raise (elevate) the painful joint above the level of your heart as often as you can.  Drink enough fluids to keep your pee (urine) clear or pale yellow.  Take over-the-counter and prescription medicines only as told by your doctor.  Do not drive or use heavy machinery while taking prescription pain  medicine.  Follow instructions from your doctor about what you can or cannot eat and drink.  Return to your normal activities as told by your doctor. Ask your doctor what activities are safe for you. Avoiding Future Gout Attacks   Follow a low-purine diet as told by a specialist (dietitian) or your doctor. Avoid foods and drinks that have a lot of purines, such as:  Liver.  Kidney.  Anchovies.  Asparagus.  Herring.  Mushrooms  Mussels.  Beer.  Limit alcohol intake to no more than 1 drink a day for nonpregnant women and 2 drinks a day for men. One drink equals 12 oz of beer, 5 oz of wine, or 1 oz of hard liquor.  Stay at a healthy weight or lose weight if you are overweight. If you want to lose weight, talk with your doctor. It is important that you do not lose weight too fast.  Start or continue an exercise plan as told by your doctor.  Drink enough fluids to keep your pee clear or pale yellow.  Take over-the-counter and prescription medicines only as told by your doctor.  Keep all follow-up visits as told by your doctor. This is important. Contact a doctor if:  You have another gout attack.  You still have symptoms of a gout attack  after10 days of treatment.  You have problems (side effects) because of your medicines.  You have chills or a fever.  You have burning pain when you pee (urinate).  You have pain in your lower back or belly. Get help right away if:  You have very bad pain.  Your pain cannot be controlled.  You cannot pee. This information is not intended to replace advice given to you by your health care provider. Make sure you discuss any questions you have with your health care provider. Document Released: 08/11/2008 Document Revised: 04/09/2016 Document Reviewed: 08/15/2015 Elsevier Interactive Patient Education  2017 [employer].     IF you received an x-ray today, you will receive an invoice from [facility] Radiology. Please contact  [facility] Radiology at [phone] with questions or concerns regarding your invoice.   IF you received labwork today, you will receive an invoice from [place]. Please contact [facility] at [phone] with questions or concerns regarding your invoice.   Our billing staff will not be able to assist you with questions regarding bills from these companies.  You will be contacted with the lab results as soon as they are available. The fastest way to get your results is to activate your My Chart account. Instructions are located on the last page of this paperwork. If you have not heard from [place] regarding the results in 2 weeks, please contact this office.

## 2017-03-18 NOTE — Addendum Note (Signed)
Addended by: Gari Crown D on: 03/18/2017 11:07 AM   Modules accepted: Orders

## 2017-03-19 LAB — VITAMIN D 25 HYDROXY (VIT D DEFICIENCY, FRACTURES): Vit D, 25-Hydroxy: 44.9 ng/mL (ref 30.0–100.0)

## 2017-03-19 LAB — PARATHYROID HORMONE, INTACT (NO CA): PTH: 28 pg/mL (ref 15–65)

## 2017-03-31 ENCOUNTER — Other Ambulatory Visit: Payer: Self-pay | Admitting: Cardiovascular Disease

## 2017-03-31 ENCOUNTER — Other Ambulatory Visit: Payer: Self-pay | Admitting: Gastroenterology

## 2017-03-31 DIAGNOSIS — I739 Peripheral vascular disease, unspecified: Secondary | ICD-10-CM

## 2017-04-01 ENCOUNTER — Encounter (HOSPITAL_COMMUNITY): Payer: Self-pay

## 2017-04-02 ENCOUNTER — Encounter (HOSPITAL_COMMUNITY): Payer: Self-pay

## 2017-04-02 ENCOUNTER — Encounter (HOSPITAL_COMMUNITY)
Admission: RE | Disposition: A | Discharge status: Home / Self Care | Payer: Self-pay | Source: Ambulatory Visit | Attending: Gastroenterology

## 2017-04-02 ENCOUNTER — Ambulatory Visit (HOSPITAL_COMMUNITY): Payer: Medicare Other | Admitting: Certified Registered Nurse Anesthetist

## 2017-04-02 ENCOUNTER — Ambulatory Visit (HOSPITAL_COMMUNITY)
Admission: RE | Admit: 2017-04-02 | Discharge: 2017-04-02 | Disposition: A | Discharge status: Home / Self Care | Payer: Medicare Other | Source: Ambulatory Visit | Attending: Gastroenterology | Admitting: Gastroenterology

## 2017-04-02 DIAGNOSIS — Z8249 Family history of ischemic heart disease and other diseases of the circulatory system: Secondary | ICD-10-CM | POA: Diagnosis not present

## 2017-04-02 DIAGNOSIS — G8929 Other chronic pain: Secondary | ICD-10-CM | POA: Diagnosis not present

## 2017-04-02 DIAGNOSIS — Z951 Presence of aortocoronary bypass graft: Secondary | ICD-10-CM | POA: Insufficient documentation

## 2017-04-02 DIAGNOSIS — Z8507 Personal history of malignant neoplasm of pancreas: Secondary | ICD-10-CM | POA: Insufficient documentation

## 2017-04-02 DIAGNOSIS — K3189 Other diseases of stomach and duodenum: Secondary | ICD-10-CM | POA: Diagnosis not present

## 2017-04-02 DIAGNOSIS — I2510 Atherosclerotic heart disease of native coronary artery without angina pectoris: Secondary | ICD-10-CM | POA: Diagnosis not present

## 2017-04-02 DIAGNOSIS — E669 Obesity, unspecified: Secondary | ICD-10-CM | POA: Insufficient documentation

## 2017-04-02 DIAGNOSIS — M1990 Unspecified osteoarthritis, unspecified site: Secondary | ICD-10-CM | POA: Diagnosis not present

## 2017-04-02 DIAGNOSIS — I252 Old myocardial infarction: Secondary | ICD-10-CM | POA: Insufficient documentation

## 2017-04-02 DIAGNOSIS — E785 Hyperlipidemia, unspecified: Secondary | ICD-10-CM | POA: Insufficient documentation

## 2017-04-02 DIAGNOSIS — K219 Gastro-esophageal reflux disease without esophagitis: Secondary | ICD-10-CM | POA: Insufficient documentation

## 2017-04-02 DIAGNOSIS — Z90411 Acquired partial absence of pancreas: Secondary | ICD-10-CM | POA: Insufficient documentation

## 2017-04-02 DIAGNOSIS — M549 Dorsalgia, unspecified: Secondary | ICD-10-CM | POA: Insufficient documentation

## 2017-04-02 DIAGNOSIS — Z96651 Presence of right artificial knee joint: Secondary | ICD-10-CM | POA: Insufficient documentation

## 2017-04-02 DIAGNOSIS — Z6832 Body mass index (BMI) 32.0-32.9, adult: Secondary | ICD-10-CM | POA: Insufficient documentation

## 2017-04-02 DIAGNOSIS — R7303 Prediabetes: Secondary | ICD-10-CM | POA: Diagnosis not present

## 2017-04-02 DIAGNOSIS — R0789 Other chest pain: Secondary | ICD-10-CM | POA: Insufficient documentation

## 2017-04-02 DIAGNOSIS — Z87891 Personal history of nicotine dependence: Secondary | ICD-10-CM | POA: Diagnosis not present

## 2017-04-02 DIAGNOSIS — I10 Essential (primary) hypertension: Secondary | ICD-10-CM | POA: Insufficient documentation

## 2017-04-02 DIAGNOSIS — Z9081 Acquired absence of spleen: Secondary | ICD-10-CM | POA: Diagnosis not present

## 2017-04-02 DIAGNOSIS — Q399 Congenital malformation of esophagus, unspecified: Secondary | ICD-10-CM | POA: Diagnosis not present

## 2017-04-02 HISTORY — PX: ESOPHAGOGASTRODUODENOSCOPY (EGD) WITH PROPOFOL: SHX5813

## 2017-04-02 LAB — GLUCOSE, CAPILLARY: GLUCOSE-CAPILLARY: 99 mg/dL (ref 65–99)

## 2017-04-02 SURGERY — ESOPHAGOGASTRODUODENOSCOPY (EGD) WITH PROPOFOL
Anesthesia: Monitor Anesthesia Care

## 2017-04-02 MED ORDER — LACTATED RINGERS IV SOLN
INTRAVENOUS | Status: DC
  Administered 2017-04-02: 1000 mL via INTRAVENOUS

## 2017-04-02 MED ORDER — ONDANSETRON HCL 4 MG/2ML IJ SOLN
INTRAMUSCULAR | Status: DC | PRN
  Administered 2017-04-02: 4 mg via INTRAVENOUS

## 2017-04-02 MED ORDER — SODIUM CHLORIDE 0.9 % IV SOLN: INTRAVENOUS | Status: DC

## 2017-04-02 MED ORDER — FENTANYL CITRATE (PF) 100 MCG/2ML IJ SOLN
INTRAMUSCULAR | Status: AC
  Filled 2017-04-02: qty 2

## 2017-04-02 MED ORDER — PROPOFOL 10 MG/ML IV BOLUS
INTRAVENOUS | Status: AC
  Filled 2017-04-02: qty 20

## 2017-04-02 MED ORDER — FENTANYL CITRATE (PF) 100 MCG/2ML IJ SOLN
INTRAMUSCULAR | Status: DC | PRN
  Administered 2017-04-02: 100 ug via INTRAVENOUS

## 2017-04-02 MED ORDER — ONDANSETRON HCL 4 MG/2ML IJ SOLN
INTRAMUSCULAR | Status: AC
  Filled 2017-04-02: qty 2

## 2017-04-02 MED ORDER — LIDOCAINE 2% (20 MG/ML) 5 ML SYRINGE
INTRAMUSCULAR | Status: AC
  Filled 2017-04-02: qty 5

## 2017-04-02 MED ORDER — PROPOFOL 500 MG/50ML IV EMUL
INTRAVENOUS | Status: DC | PRN
  Administered 2017-04-02: 75 ug/kg/min via INTRAVENOUS

## 2017-04-02 SURGICAL SUPPLY — 15 items

## 2017-04-02 NOTE — H&P (Signed)
Steven Hale. HPI: The patient complains about chest tightness and bloating. His symptoms were reminiscent of his prior MI and he underwent repeat work up with cardiology, but no cardiac etiology was identified. He is s/p CABG in 2011 and his most recent cardiac catherization was 12/2015 with patent vessels. He was referred to GI for further evaluation of his noncardiac chest pain, bloating, and weight gain. He reports that over the weekend, with eating once taco, he gained 10 lbs. He reports that he can gain and lose weight very quickly. When he has the bloating, he feels very warm, his "skin heats up", and tired. On 04/19/2008 an MRI revealed a 9.5 cm serious cystadenoma, but the final path from the distal pancreatectomy (05/10/2008) was a solid pseudopapillary tumor. His colonoscopy with Dr. Deatra Ina on 12/20/2014 was normal and it was performed for screening. There is an early satiety component to his symptoms.  Past Medical History:  Diagnosis Date  . Anemia, unspecified   . Bell's palsy   . Blood transfusion    during treatment for Ca  . Blood transfusion without reported diagnosis   . CAD (coronary artery disease)    a. s/p multiple PCIs;  b. s/p CABG in 09/2010 (LIMA-LAD, SVG-OM1, SVG-distal RCA/OM2);  Cardiac cath in 12/2015: Mild LAD disease, occluded mid LCX and proximal RCA (left to right collaterals), Occluded SVG to RCA and atretic LIMA. Patent SVG to OM  . Chronic back pain   . DJD (degenerative joint disease)    low back & all over   . GERD (gastroesophageal reflux disease)   . Helicobacter pylori (H. pylori) infection   . Hemorrhoids   . Hyperlipidemia   . Hypertension   . Impotence of organic origin   . Myocardial infarction ([place])    2005 and 2012   . PAD (peripheral artery disease) ([place])    a. s/p bilat SFA stents;  b. ABIs 11/2012: R 0.99, L 0.86.  [name] Pancreatic cancer [facility])    surgery 2009  . Pre-diabetes 08/16/2015    Past Surgical History:  Procedure Laterality  Date  . ABDOMINAL AORTAGRAM N/A 09/13/2013   Procedure: ABDOMINAL Maxcine Ham;  Surgeon: Wellington Hampshire, MD;  Location: [place] CATH LAB;  Service: Cardiovascular;  Laterality: N/A;  . ABDOMINAL AORTAGRAM N/A 08/29/2014   Procedure: ABDOMINAL Maxcine Ham;  Surgeon: Wellington Hampshire, MD;  Location: [place] CATH LAB;  Service: Cardiovascular;  Laterality: N/A;  . BACK SURGERY     1992  . CARDIAC CATHETERIZATION N/A 12/25/2015   Procedure: Left Heart Cath and Cors/Grafts Angiography;  Surgeon: Wellington Hampshire, MD;  Location: [place] CV LAB;  Service: Cardiovascular;  Laterality: N/A;  . CORONARY ARTERY BYPASS GRAFT  nov 2011   x 4  . HEMORRHOID SURGERY  10/30/2011   Procedure: HEMORRHOIDECTOMY;  Surgeon: Harl Bowie, MD;  Location: [place];  Service: General;  Laterality: N/A;  . pancreatic  cancer  05/10/2008   Resection of distal panrease and spleen  . PARTIAL KNEE ARTHROPLASTY Right 11/19/2014   Procedure: RIGHT UNI-KNEE ARTHROPLASTY MEDIALLY;  Surgeon: Mauri Pole, MD;  [facility];  Service: Orthopedics;  Laterality: Right;  . PERIPHERAL VASCULAR CATHETERIZATION  Oct 2014   Lt SFA PTA  . PERIPHERAL VASCULAR CATHETERIZATION  Oct 2015   ISR Lt SFA-PTA  . right partial knee replacement    . splenectomy    . THROMBECTOMY FEMORAL ARTERY Left 09/13/2013   Procedure: THROMBECTOMY FEMORAL ARTERY;  Surgeon: Wellington Hampshire, MD;  Location: [place] CATH LAB;  Service: Cardiovascular;  Laterality: Left;    Family History  Problem Relation Age of Onset  . Heart attack Father        died of MI at age 42  . Cancer Father   . Anesthesia problems Neg Hx   . Hypotension Neg Hx   . Malignant hyperthermia Neg Hx   . Pseudochol deficiency Neg Hx   . Colon cancer Neg Hx   . Esophageal cancer Neg Hx   . Prostate cancer Neg Hx   . Rectal cancer Neg Hx   . Stomach cancer Neg Hx     Social History:  reports that he quit smoking about 8 years ago. His smoking use included Cigarettes. He has never used  smokeless tobacco. He reports that he drinks about 1.8 oz of alcohol per week . He reports that he does not use drugs.  Allergies: No Known Allergies  Medications: Scheduled: Continuous:  No results found for this or any previous visit (from the past 24 hour(s)).   No results found.  ROS:  As stated above in the HPI otherwise negative.  There were no vitals taken for this visit.    PE: Gen: NAD, Alert and Oriented HEENT:  [place]/AT, EOMI Neck: Supple, no LAD Lungs: CTA Bilaterally CV: RRR without M/G/R ABM: Soft, NTND, +BS Ext: No C/C/E  Assessment/Plan: 1) Noncardiac chest pain. 2) Bloating.  Plan: 1) EGD.  Gearline Spilman D 04/02/2017, 10:16 AM

## 2017-04-02 NOTE — Anesthesia Preprocedure Evaluation (Signed)
Anesthesia Evaluation  Patient identified by MRN, date of birth, ID band Patient awake    Reviewed: Allergy & Precautions, H&P , NPO status , Patient's Chart, lab work & pertinent test results  Airway Mallampati: II   Neck ROM: full    Dental   Pulmonary former smoker,    breath sounds clear to auscultation       Cardiovascular hypertension, + CAD, + Past MI, + CABG and + Peripheral Vascular Disease   Rhythm:regular Rate:Normal     Neuro/Psych    GI/Hepatic GERD  ,  Endo/Other  obese  Renal/GU      Musculoskeletal  (+) Arthritis ,   Abdominal   Peds  Hematology   Anesthesia Other Findings   Reproductive/Obstetrics                             Anesthesia Physical Anesthesia Plan  ASA: III  Anesthesia Plan: MAC   Post-op Pain Management:    Induction: Intravenous  Airway Management Planned: Nasal Cannula  Additional Equipment:   Intra-op Plan:   Post-operative Plan:   Informed Consent: I have reviewed the patients History and Physical, chart, labs and discussed the procedure including the risks, benefits and alternatives for the proposed anesthesia with the patient or authorized representative who has indicated his/her understanding and acceptance.     Plan Discussed with: CRNA, Anesthesiologist and Surgeon  Anesthesia Plan Comments:         Anesthesia Quick Evaluation

## 2017-04-02 NOTE — Discharge Instructions (Signed)
Esophagogastroduodenoscopy, Care After °Refer to this sheet in the next few weeks. These instructions provide you with information about caring for yourself after your procedure. Your health care provider may also give you more specific instructions. Your treatment has been planned according to current medical practices, but problems sometimes occur. Call your health care provider if you have any problems or questions after your procedure. °What can I expect after the procedure? °After the procedure, it is common to have: °· A sore throat. °· Nausea. °· Bloating. °· Dizziness. °· Fatigue. °Follow these instructions at home: °· Do not eat or drink anything until the numbing medicine (local anesthetic) has worn off and your gag reflex has returned. You will know that the local anesthetic has worn off when you can swallow comfortably. °· Do not drive for 24 hours if you received a medicine to help you relax (sedative). °· If your health care provider took a tissue sample for testing during the procedure, make sure to get your test results. This is your responsibility. Ask your health care provider or the department performing the test when your results will be ready. °· Keep all follow-up visits as told by your health care provider. This is important. °Contact a health care provider if: °· You cannot stop coughing. °· You are not urinating. °· You are urinating less than usual. °Get help right away if: °· You have trouble swallowing. °· You cannot eat or drink. °· You have throat or chest pain that gets worse. °· You are dizzy or light-headed. °· You faint. °· You have nausea or vomiting. °· You have chills. °· You have a fever. °· You have severe abdominal pain. °· You have black, tarry, or bloody stools. °This information is not intended to replace advice given to you by your health care provider. Make sure you discuss any questions you have with your health care provider. °Document Released: 10/19/2012 Document  Revised: 04/09/2016 Document Reviewed: 09/26/2015 °Elsevier Interactive Patient Education © 2017 Elsevier Inc. ° °

## 2017-04-02 NOTE — Transfer of Care (Signed)
Immediate Anesthesia Transfer of Care Note  Patient: Steven Hale.  Procedure(s) Performed: Procedure(s): ESOPHAGOGASTRODUODENOSCOPY (EGD) WITH PROPOFOL (N/A)  Patient Location: PACU  Anesthesia Type:MAC  Level of Consciousness: alert  and oriented  Airway & Oxygen Therapy: Patient Spontanous Breathing and Patient connected to nasal cannula oxygen  Post-op Assessment: Report given to RN and Post -op Vital signs reviewed and stable  Post vital signs: Reviewed and stable  Last Vitals:  Vitals:   04/02/17 1205  BP: 138/75  Pulse: (!) 55  Resp: 13  Temp: 36.7 C    Last Pain:  Vitals:   04/02/17 1205  TempSrc: Oral         Complications: No apparent anesthesia complications

## 2017-04-02 NOTE — Progress Notes (Signed)
Procedure end time at 1245, patient ready to be discharged at 1330.  Patient's caregiver unable to pick up patient until after getting off work at [employer].  RN attempted to call patient's friend to pick up earlier with no answer, voicemail left.  Patient informed RN unable to discharge without a responsible adult.  While this RN was on the phone, patient told another employee that he needed to leave and he was going to walk out.   The patient was told he needed to stay and he could not leave.  That employee then told this RN that patient was trying to leave and when we went back to wheelchair, patient had left unit.  Several employees searched the hospital entrances to look for the patient, but he was not found.  Security notified.    Vista Lawman, RN

## 2017-04-02 NOTE — Op Note (Signed)
[facility] Patient Name: Steven Hale Procedure Date: 04/02/2017 MRN: [id number] Attending MD: Carol Ada , MD Date of Birth: [birth year] CSN: [id number] Age: 61 Admit Type: Outpatient Procedure:                Upper GI endoscopy Indications:              Abdominal bloating, Chest pain (non cardiac) Providers:                Carol Ada, MD, Carmie End, RN, Lillie Fragmin, RN, William Dalton, Technician Referring MD:              Medicines:                Propofol per Anesthesia Complications:            No immediate complications. Estimated Blood Loss:     Estimated blood loss was minimal. Procedure:                Pre-Anesthesia Assessment:                           - Prior to the procedure, a History and Physical                            was performed, and patient medications and                            allergies were reviewed. The patient's tolerance of                            previous anesthesia was also reviewed. The risks                            and benefits of the procedure and the sedation                            options and risks were discussed with the patient.                            All questions were answered, and informed consent                            was obtained. Prior Anticoagulants: The patient has                            taken no previous anticoagulant or antiplatelet                            agents. ASA Grade Assessment: III - A patient with                            severe systemic disease. After reviewing the risks  and benefits, the patient was deemed in                            satisfactory condition to undergo the procedure.                           - Sedation was administered by an anesthesia                            professional. Deep sedation was attained.                           After obtaining informed consent, the endoscope was                  passed under direct vision. Throughout the                            procedure, the patient's blood pressure, pulse, and                            oxygen saturations were monitored continuously. The                            EG-[id number] ([id number]) scope was introduced through the                            mouth, and advanced to the second part of duodenum.                            The upper GI endoscopy was accomplished without                            difficulty. The patient tolerated the procedure                            well. Scope In: Scope Out: Findings:      The lower third of the esophagus was mildly tortuous.      Striped mildly erythematous mucosa without bleeding was found in the       gastric antrum. Biopsies were taken with a cold forceps for histology.      The examined duodenum was normal. Impression:               - Tortuous esophagus.                           - Erythematous mucosa in the antrum. Biopsied.                           - Normal examined duodenum. Moderate Sedation:      N/A- Per Anesthesia Care Recommendation:           - Patient has a contact number available for                            emergencies. The signs and symptoms of potential  delayed complications were discussed with the                            patient. Return to normal activities tomorrow.                            Written discharge instructions were provided to the                            patient.                           - Resume previous diet.                           - Continue present medications.                           - Await pathology results.                           - Return to GI clinic in 4 weeks. Procedure Code(s):        --- Professional ---                           [phone], Esophagogastroduodenoscopy, flexible,                            transoral; with biopsy, single or multiple Diagnosis Code(s):        ---  Professional ---                           Q39.9, Congenital malformation of esophagus,                            unspecified                           K31.89, Other diseases of stomach and duodenum                           R14.0, Abdominal distension (gaseous)                           R07.89, Other chest pain CPT copyright 2016 American Medical Association. All rights reserved. The codes documented in this report are preliminary and upon coder review may  be revised to meet current compliance requirements. Carol Ada, MD Carol Ada, MD 04/02/2017 12:50:11 PM This report has been signed electronically. Number of Addenda: 0

## 2017-04-04 ENCOUNTER — Encounter (HOSPITAL_COMMUNITY): Payer: Self-pay | Admitting: Gastroenterology

## 2017-04-05 ENCOUNTER — Ambulatory Visit (HOSPITAL_COMMUNITY)
Admission: RE | Admit: 2017-04-05 | Discharge: 2017-04-05 | Disposition: A | Discharge status: Home / Self Care | Payer: Medicare Other | Source: Ambulatory Visit | Attending: Cardiovascular Disease | Admitting: Cardiovascular Disease

## 2017-04-05 DIAGNOSIS — I2510 Atherosclerotic heart disease of native coronary artery without angina pectoris: Secondary | ICD-10-CM | POA: Diagnosis not present

## 2017-04-05 DIAGNOSIS — E785 Hyperlipidemia, unspecified: Secondary | ICD-10-CM | POA: Insufficient documentation

## 2017-04-05 DIAGNOSIS — I10 Essential (primary) hypertension: Secondary | ICD-10-CM | POA: Diagnosis not present

## 2017-04-05 DIAGNOSIS — Z95828 Presence of other vascular implants and grafts: Secondary | ICD-10-CM | POA: Insufficient documentation

## 2017-04-05 DIAGNOSIS — I739 Peripheral vascular disease, unspecified: Secondary | ICD-10-CM | POA: Diagnosis not present

## 2017-04-05 DIAGNOSIS — Z87891 Personal history of nicotine dependence: Secondary | ICD-10-CM | POA: Diagnosis not present

## 2017-04-05 DIAGNOSIS — Z951 Presence of aortocoronary bypass graft: Secondary | ICD-10-CM | POA: Diagnosis not present

## 2017-04-07 ENCOUNTER — Ambulatory Visit: Payer: Medicare Other | Admitting: Family Medicine

## 2017-04-07 NOTE — Anesthesia Postprocedure Evaluation (Signed)
Anesthesia Post Note  Patient: Steven Hale.  Procedure(s) Performed: Procedure(s) (LRB): ESOPHAGOGASTRODUODENOSCOPY (EGD) WITH PROPOFOL (N/A)  Patient location during evaluation: PACU Anesthesia Type: MAC Level of consciousness: awake and alert Pain management: pain level controlled Vital Signs Assessment: post-procedure vital signs reviewed and stable Respiratory status: spontaneous breathing, nonlabored ventilation, respiratory function stable and patient connected to nasal cannula oxygen Cardiovascular status: stable and blood pressure returned to baseline Anesthetic complications: no       Last Vitals:  Vitals:   04/02/17 1310 04/02/17 1320  BP: 126/76 131/85  Pulse: (!) 57 61  Resp: 11 13  Temp:      Last Pain:  Vitals:   04/05/17 1036  TempSrc:   PainSc: 0-No pain                 Shantil Vallejo S

## 2017-04-08 ENCOUNTER — Encounter: Payer: Self-pay | Admitting: Family Medicine

## 2017-04-08 ENCOUNTER — Ambulatory Visit (INDEPENDENT_AMBULATORY_CARE_PROVIDER_SITE_OTHER): Payer: Medicare Other | Admitting: Family Medicine

## 2017-04-08 VITALS — BP 110/67 | HR 66 | Temp 98.3°F | Resp 16 | Ht 69.0 in | Wt 216.8 lb

## 2017-04-08 DIAGNOSIS — Z0000 Encounter for general adult medical examination without abnormal findings: Secondary | ICD-10-CM | POA: Diagnosis not present

## 2017-04-08 NOTE — Patient Instructions (Addendum)
It was good to see you again today!  We are checking labs today, which will include the prostate test.  I will let you know these results.  If everything looks good, I will send in a treatment to help with your urinary symptoms.   Health Maintenance, Male A healthy lifestyle and preventive care is important for your health and wellness. Ask your health care provider about what schedule of regular examinations is right for you. What should I know about weight and diet?  Eat a Healthy Diet  Eat plenty of vegetables, fruits, whole grains, low-fat dairy products, and lean protein.  Do not eat a lot of foods high in solid fats, added sugars, or salt. Maintain a Healthy Weight  Regular exercise can help you achieve or maintain a healthy weight. You should:  Do at least 150 minutes of exercise each week. The exercise should increase your heart rate and make you sweat (moderate-intensity exercise).  Do strength-training exercises at least twice a week. Watch Your Levels of Cholesterol and Blood Lipids  Have your blood tested for lipids and cholesterol every 5 years starting at 61 years of age. If you are at high risk for heart disease, you should start having your blood tested when you are 61 years old. You may need to have your cholesterol levels checked more often if:  Your lipid or cholesterol levels are high.  You are older than 61 years of age.  You are at high risk for heart disease. What should I know about cancer screening? Many types of cancers can be detected early and may often be prevented. Lung Cancer  You should be screened every year for lung cancer if:  You are a current smoker who has smoked for at least 30 years.  You are a former smoker who has quit within the past 15 years.  Talk to your health care provider about your screening options, when you should start screening, and how often you should be screened. Colorectal Cancer  Routine colorectal cancer  screening usually begins at 61 years of age and should be repeated every 5-10 years until you are 61 years old. You may need to be screened more often if early forms of precancerous polyps or small growths are found. Your health care provider may recommend screening at an earlier age if you have risk factors for colon cancer.  Your health care provider may recommend using home test kits to check for hidden blood in the stool.  A small camera at the end of a tube can be used to examine your colon (sigmoidoscopy or colonoscopy). This checks for the earliest forms of colorectal cancer. Prostate and Testicular Cancer  Depending on your age and overall health, your health care provider may do certain tests to screen for prostate and testicular cancer.  Talk to your health care provider about any symptoms or concerns you have about testicular or prostate cancer. Skin Cancer  Check your skin from head to toe regularly.  Tell your health care provider about any new moles or changes in moles, especially if:  There is a change in a mole's size, shape, or color.  You have a mole that is larger than a pencil eraser.  Always use sunscreen. Apply sunscreen liberally and repeat throughout the day.  Protect yourself by wearing long sleeves, pants, a wide-brimmed hat, and sunglasses when outside. What should I know about heart disease, diabetes, and high blood pressure?  If you are 37-70 years of age,  have your blood pressure checked every 3-5 years. If you are 89 years of age or older, have your blood pressure checked every year. You should have your blood pressure measured twice-once when you are at a hospital or clinic, and once when you are not at a hospital or clinic. Record the average of the two measurements. To check your blood pressure when you are not at a hospital or clinic, you can use:  An automated blood pressure machine at a pharmacy.  A home blood pressure monitor.  Talk to your health  care provider about your target blood pressure.  If you are between 53-3 years old, ask your health care provider if you should take aspirin to prevent heart disease.  Have regular diabetes screenings by checking your fasting blood sugar level.  If you are at a normal weight and have a low risk for diabetes, have this test once every three years after the age of 50.  If you are overweight and have a high risk for diabetes, consider being tested at a younger age or more often.  A one-time screening for abdominal aortic aneurysm (AAA) by ultrasound is recommended for men aged 71-75 years who are current or former smokers. What should I know about preventing infection? Hepatitis B  If you have a higher risk for hepatitis B, you should be screened for this virus. Talk with your health care provider to find out if you are at risk for hepatitis B infection. Hepatitis C  Blood testing is recommended for:  Everyone born from [birth year] through 1965.  Anyone with known risk factors for hepatitis C. Sexually Transmitted Diseases (STDs)  You should be screened each year for STDs including gonorrhea and chlamydia if:  You are sexually active and are younger than 61 years of age.  You are older than 61 years of age and your health care provider tells you that you are at risk for this type of infection.  Your sexual activity has changed since you were last screened and you are at an increased risk for chlamydia or gonorrhea. Ask your health care provider if you are at risk.  Talk with your health care provider about whether you are at high risk of being infected with HIV. Your health care provider may recommend a prescription medicine to help prevent HIV infection. What else can I do?  Schedule regular health, dental, and eye exams.  Stay current with your vaccines (immunizations).  Do not use any tobacco products, such as cigarettes, chewing tobacco, and e-cigarettes. If you need help quitting,  ask your health care provider.  Limit alcohol intake to no more than 2 drinks per day. One drink equals 12 ounces of beer, 5 ounces of wine, or 1 ounces of hard liquor.  Do not use street drugs.  Do not share needles.  Ask your health care provider for help if you need support or information about quitting drugs.  Tell your health care provider if you often feel depressed.  Tell your health care provider if you have ever been abused or do not feel safe at home. This information is not intended to replace advice given to you by your health care provider. Make sure you discuss any questions you have with your health care provider. Document Released: 04/30/2008 Document Revised: 07/01/2016 Document Reviewed: 08/06/2015 Elsevier Interactive Patient Education  2017 [employer].   IF you received an x-ray today, you will receive an invoice from [facility]. Please contact [facility] at [phone]  with questions or concerns regarding your invoice.   IF you received labwork today, you will receive an invoice from [place]. Please contact [facility] at [phone] with questions or concerns regarding your invoice.   Our billing staff will not be able to assist you with questions regarding bills from these companies.  You will be contacted with the lab results as soon as they are available. The fastest way to get your results is to activate your My Chart account. Instructions are located on the last page of this paperwork. If you have not heard from [place] regarding the results in 2 weeks, please contact this office.

## 2017-04-08 NOTE — Progress Notes (Signed)
Steven Hale. is a 61 y.o. male who presents to [facility] today for comprehensive physical examination:  CPE:  Patient here for CPE.   Concerns:  He would like to be checked for prostate cancer.  No family history of the same.  He has occasional LUTS symptoms, including nocturia and decreased stream.  This doesn't occur on daily basis, but does occur several times a week.  Also with some erectile dysfunction.   Last physical last year Tetanus 2009 Flu vaccine last year Eye exam:  n/a Dental exam every six months.   PMH reviewed. Patient is a nonsmoker.   Past Medical History:  Diagnosis Date  . Anemia, unspecified   . Bell's palsy   . Blood transfusion    during treatment for Ca  . Blood transfusion without reported diagnosis   . CAD (coronary artery disease)    a. s/p multiple PCIs;  b. s/p CABG in 09/2010 (LIMA-LAD, SVG-OM1, SVG-distal RCA/OM2);  Cardiac cath in 12/2015: Mild LAD disease, occluded mid LCX and proximal RCA (left to right collaterals), Occluded SVG to RCA and atretic LIMA. Patent SVG to OM  . Chronic back pain   . Diabetes mellitus without complication ([place])   . DJD (degenerative joint disease)    low back & all over   . GERD (gastroesophageal reflux disease)   . Helicobacter pylori (H. pylori) infection   . Hemorrhoids   . Hyperlipidemia   . Hypertension   . Impotence of organic origin   . Myocardial infarction ([place])    2005 and 2012   . PAD (peripheral artery disease) ([place])    a. s/p bilat SFA stents;  b. ABIs 11/2012: R 0.99, L 0.86.  [name] Pancreatic cancer [facility])    surgery 2009  . Pre-diabetes 08/16/2015   Past Surgical History:  Procedure Laterality Date  . ABDOMINAL AORTAGRAM N/A 09/13/2013   Procedure: ABDOMINAL Maxcine Ham;  Surgeon: Wellington Hampshire, MD;  Location: [place] CATH LAB;  Service: Cardiovascular;  Laterality: N/A;  . ABDOMINAL AORTAGRAM N/A 08/29/2014   Procedure: ABDOMINAL Maxcine Ham;  Surgeon: Wellington Hampshire, MD;   Location: [place] CATH LAB;  Service: Cardiovascular;  Laterality: N/A;  . BACK SURGERY     1992  . CARDIAC CATHETERIZATION N/A 12/25/2015   Procedure: Left Heart Cath and Cors/Grafts Angiography;  Surgeon: Wellington Hampshire, MD;  Location: [place] CV LAB;  Service: Cardiovascular;  Laterality: N/A;  . CORONARY ARTERY BYPASS GRAFT  nov 2011   x 4  . ESOPHAGOGASTRODUODENOSCOPY (EGD) WITH PROPOFOL N/A 04/02/2017   Procedure: ESOPHAGOGASTRODUODENOSCOPY (EGD) WITH PROPOFOL;  Surgeon: Carol Ada, MD;  [facility];  Service: Endoscopy;  Laterality: N/A;  . HEMORRHOID SURGERY  10/30/2011   Procedure: HEMORRHOIDECTOMY;  Surgeon: Harl Bowie, MD;  Location: [place];  Service: General;  Laterality: N/A;  . JOINT REPLACEMENT    . pancreatic  cancer  05/10/2008   Resection of distal panrease and spleen  . PARTIAL KNEE ARTHROPLASTY Right 11/19/2014   Procedure: RIGHT UNI-KNEE ARTHROPLASTY MEDIALLY;  Surgeon: Mauri Pole, MD;  [facility];  Service: Orthopedics;  Laterality: Right;  . PERIPHERAL VASCULAR CATHETERIZATION  Oct 2014   Lt SFA PTA  . PERIPHERAL VASCULAR CATHETERIZATION  Oct 2015   ISR Lt SFA-PTA  . right partial knee replacement    . splenectomy    . THROMBECTOMY FEMORAL ARTERY Left 09/13/2013   Procedure: THROMBECTOMY FEMORAL ARTERY;  Surgeon: Wellington Hampshire, MD;  Location: [facility] CATH  LAB;  Service: Cardiovascular;  Laterality: Left;    Medications reviewed. Current Outpatient Prescriptions  Medication Sig Dispense Refill  . aspirin EC 81 MG tablet Take 1 tablet (81 mg total) by mouth daily.    [name] atorvastatin (LIPITOR) 40 MG tablet Take 1 tablet (40 mg total) by mouth daily. (Patient taking differently: Take 40 mg by mouth at bedtime. ) 90 tablet 3  . cetirizine (ZYRTEC) 10 MG tablet Take 10 mg by mouth daily as needed for allergies.    . fish oil-omega-3 fatty acids 1000 MG capsule Take 1 capsule (1 g total) by mouth daily. 60 capsule 1  . gabapentin (NEURONTIN) 600  MG tablet Take 600 mg by mouth 3 (three) times daily.    . isosorbide mononitrate (IMDUR) 30 MG 24 hr tablet Take 1 tablet (30 mg total) by mouth daily. 90 tablet 3  . lisinopril (PRINIVIL,ZESTRIL) 5 MG tablet TAKE 0.5 TABLETS BY MOUTH  DAILY. 15 tablet 0  . metFORMIN (GLUCOPHAGE) 500 MG tablet TAKE 1 TABLET BY MOUTH 2 TIMES DAILY WITH A MEAL. 180 tablet 3  . metoprolol tartrate (LOPRESSOR) 25 MG tablet Take 1 tablet by mouth two  times daily 60 tablet 0  . Multiple Vitamin (MULTIVITAMIN) tablet Take 1 tablet by mouth daily. 30 tablet 10  . Pancrelipase, Lip-Prot-Amyl, (ZENPEP) 25000 units CPEP Take 1 capsule by mouth 3 (three) times daily before meals.    . pantoprazole (PROTONIX) 40 MG tablet Take 1 tablet (40 mg total) by mouth daily. 30 tablet 11  . nitroGLYCERIN (NITROSTAT) 0.4 MG SL tablet Place 1 tablet (0.4 mg total) under the tongue every 5 (five) minutes as needed for chest pain. 25 tablet 2   No current facility-administered medications for this visit.     Social: Smoking history:  nonsmoker Alcohol use:  occasional Illicit drug use:  denies Occupation:  Patient works from home  Family History:  Family history of stroke and HTN and DM2.  No family history of prostate cancer.   Review of Systems  Constitutional: Negative for fever.  HENT: Negative for congestion, ear discharge, ear pain and hearing loss.   Eyes: Negative for blurred vision.  Respiratory: Negative for cough and wheezing.   Cardiovascular: Negative for chest pain, palpitations and leg swelling.  Gastrointestinal: Negative for nausea, vomiting and abdominal pain.  Genitourinary: Negative for dysuria, hematuria and flank pain. See above Musculoskeletal: Negative for neck pain.  Skin: Negative for rash.  Neurological: Negative for dizziness and headaches.  Psychiatric/Behavioral: Negative for depression and suicidal ideas.   Exam: BP 110/67   Pulse 66   Temp 98.3 F (36.8 C) (Oral)   Resp 16   Ht 5\' 9"   (1.753 m)   Wt 216 lb 12.8 oz (98.3 kg)   SpO2 96%   BMI 32.02 kg/m  Gen:  Alert, cooperative patient who appears stated age in no acute distress.  Vital signs reviewed. Head: [place]/AT.   Eyes:  EOMI, PERRL.   Ears:  External ears WNL, Bilateral TM's normal without retraction, redness or bulging. Nose:  Septum midline  Mouth:  MMM, tonsils non-erythematous, non-edematous.   Neck: No masses or thyromegaly or limitation in range of motion.  No cervical lymphadenopathy. Pulm:  Clear to auscultation bilaterally with good air movement.  No wheezes or rales noted.   Cardiac:  Regular rate and rhythm without murmur auscultated.  Good S1/S2. Abd:  Soft/nondistended/nontender.  Rectal:  Deferred at patient request Ext:  No clubbing/cyanosis/erythema.  No edema noted bilateral  lower extremities.   Neuro:  Grossly normal, no gait abnormalities Psych:  Not depressed or anxious appearing.  Conversant and engaged  Impression/Plan: 1. Complete Physical Examination: anticipatory guidance provided.  4.  Screening cholesterol: obtain FLP. - also checking for PSA today after discussion with patient.  - Does have LUTS symptoms, plan to treat for this based on symptoms if PSA negative.  Hold on treatment of ED due to Imdur.

## 2017-04-09 LAB — LIPID PANEL
Chol/HDL Ratio: 3.3 ratio (ref 0.0–5.0)
Cholesterol, Total: 145 mg/dL (ref 100–199)
HDL: 44 mg/dL
LDL Calculated: 75 mg/dL (ref 0–99)
Triglycerides: 131 mg/dL (ref 0–149)
VLDL Cholesterol Cal: 26 mg/dL (ref 5–40)

## 2017-04-09 LAB — PSA: Prostate Specific Ag, Serum: 0.7 ng/mL (ref 0.0–4.0)

## 2017-04-14 ENCOUNTER — Telehealth: Payer: Self-pay | Admitting: Family Medicine

## 2017-04-14 MED ORDER — TAMSULOSIN HCL 0.4 MG PO CAPS: 0.4000 mg | ORAL_CAPSULE | Freq: Every day | ORAL | 3 refills | Status: DC

## 2017-04-14 NOTE — Telephone Encounter (Signed)
Pt is calling about lab results   Best number [phone]

## 2017-04-14 NOTE — Telephone Encounter (Signed)
Normal, any concerns/comments?

## 2017-04-14 NOTE — Telephone Encounter (Signed)
Normal, no concerns.  He's calling about his PSA prostate test, which was completely normal.  I thought I had called him about this, evidently not!  Please call and let him know it was completely normal.    I will send in a medicine (flomax) to help with the urinary issues he was complaining of.  Thanks, JW

## 2017-04-14 NOTE — Telephone Encounter (Signed)
Spoke with pt and gave him results from his PSA results. Also advised pt that Flomax was sent over to his pharmacy. Pt wanted to know if his testosterone levels were checked, advised pt that there were not but if he wants to make an appointment and have levels checked. Pt stated that he wanted to come in, call transferred to front desk to make appointment.

## 2017-04-15 ENCOUNTER — Telehealth: Payer: Self-pay

## 2017-04-15 DIAGNOSIS — R399 Unspecified symptoms and signs involving the genitourinary system: Secondary | ICD-10-CM

## 2017-04-15 NOTE — Telephone Encounter (Signed)
I COULD NOT ATTACH THE PHONE MESSAGE FROM [name]. 04/14/17 TO TODAY'S MESSAGE BECAUSE THE ENCOUNTER IS CLOSED. PATIENT WOULD LIKE TO ASK DR. Mingo Amber IF HE CAN COME IN TO HAVE HIS BLOOD DRAWN TO CHECK HIS TESTOSTERONE LEVEL - A LAB ONLY ORDER? HE FOR GOT TO ASK HIM WHEN HE HAD HIS COMPLETE PHYSICAL DONE WITH DR. Mingo Amber A WEEK AGO. HE ALSO SAID DR. Mingo Amber ASKED HIM HOW HIS GOUT WAS DOING AND HE TOLD HIM IT WAS DOING FINE. TODAY IT HAS FLARED UP IN HIS (L) GREAT TOE AND HE NEEDS TO GET A REFILL. HE DID NOT KNOW THE NAME OF THE MEDICINE. BEST PHONE [phone] (CELL) PHARMACY CHOICE IS [facility] ON [address] [place]

## 2017-04-16 NOTE — Telephone Encounter (Signed)
Please place order if ok

## 2017-04-17 MED ORDER — COLCHICINE 0.6 MG PO TABS: 0.6000 mg | ORAL_TABLET | Freq: Two times a day (BID) | ORAL | 0 refills | Status: DC

## 2017-04-17 NOTE — Telephone Encounter (Signed)
I have sent in colchicine for him to treat the gout and have put in a future order to check his testosterone.  Thanks!  JW

## 2017-04-17 NOTE — Telephone Encounter (Signed)
Pt advised.

## 2017-04-19 ENCOUNTER — Other Ambulatory Visit: Payer: Medicare Other

## 2017-04-19 DIAGNOSIS — R399 Unspecified symptoms and signs involving the genitourinary system: Secondary | ICD-10-CM

## 2017-04-20 LAB — TESTOSTERONE, FREE, TOTAL, SHBG
Sex Hormone Binding: 26.6 nmol/L (ref 19.3–76.4)
TESTOSTERONE: 172 ng/dL — AB (ref 264–916)
Testosterone, Free: 4.5 pg/mL — ABNORMAL LOW (ref 6.6–18.1)

## 2017-04-21 ENCOUNTER — Other Ambulatory Visit: Payer: Medicare Other | Admitting: Physician Assistant

## 2017-04-28 ENCOUNTER — Encounter: Payer: Self-pay | Admitting: Family Medicine

## 2017-04-28 ENCOUNTER — Ambulatory Visit (INDEPENDENT_AMBULATORY_CARE_PROVIDER_SITE_OTHER): Payer: Medicare Other | Admitting: Family Medicine

## 2017-04-28 VITALS — BP 102/65 | HR 64 | Temp 98.4°F | Resp 16 | Ht 69.0 in | Wt 214.0 lb

## 2017-04-28 DIAGNOSIS — R7989 Other specified abnormal findings of blood chemistry: Secondary | ICD-10-CM | POA: Diagnosis not present

## 2017-04-28 DIAGNOSIS — N529 Male erectile dysfunction, unspecified: Secondary | ICD-10-CM

## 2017-04-28 NOTE — Patient Instructions (Signed)
     IF you received an x-ray today, you will receive an invoice from [facility]. Please contact [facility] at [phone] with questions or concerns regarding your invoice.   IF you received labwork today, you will receive an invoice from [facility]. Please contact [facility] at [phone] with questions or concerns regarding your invoice.   Our billing staff will not be able to assist you with questions regarding bills from these companies.  You will be contacted with the lab results as soon as they are available. The fastest way to get your results is to activate your My Chart account. Instructions are located on the last page of this paperwork. If you have not heard from us regarding the results in 2 weeks, please contact this office.     

## 2017-04-28 NOTE — Progress Notes (Signed)
Steven Hale. is a 61 y.o. male who presents to Primary Care at [facility] today for FU on labs:  1.  FU on labs:  Patient reports receiving Mychart message about low testosterone and normal TSH.  He would like to discuss this further.  States that his energy is pretty good.  Does have difficulties with erectile dysfunction.  Has known cardiac disease including multiple PCI's and CABG in 2011.  He has been treated previously with Cialis for his ED in the past.  Thinks he has taken this s/p his CABG.    Some LUT symptoms as he described last visit.  Currently no chest pain/dyspnea on exertion.  Intermittent morning erections.  No palpitations.  Would like something to treat his ED.    ROS as above.    PMH reviewed. Patient is a nonsmoker.   Past Medical History:  Diagnosis Date  . Anemia, unspecified   . Bell's palsy   . Blood transfusion    during treatment for Ca  . Blood transfusion without reported diagnosis   . CAD (coronary artery disease)    a. s/p multiple PCIs;  b. s/p CABG in 09/2010 (LIMA-LAD, SVG-OM1, SVG-distal RCA/OM2);  Cardiac cath in 12/2015: Mild LAD disease, occluded mid LCX and proximal RCA (left to right collaterals), Occluded SVG to RCA and atretic LIMA. Patent SVG to OM  . Chronic back pain   . Diabetes mellitus without complication ([place])   . DJD (degenerative joint disease)    low back & all over   . GERD (gastroesophageal reflux disease)   . Helicobacter pylori (H. pylori) infection   . Hemorrhoids   . Hyperlipidemia   . Hypertension   . Impotence of organic origin   . Myocardial infarction ([place])    2005 and 2012   . PAD (peripheral artery disease) ([place])    a. s/p bilat SFA stents;  b. ABIs 11/2012: R 0.99, L 0.86.  [name] Pancreatic cancer [facility])    surgery 2009  . Pre-diabetes 08/16/2015   Past Surgical History:  Procedure Laterality Date  . ABDOMINAL AORTAGRAM N/A 09/13/2013   Procedure: ABDOMINAL Maxcine Ham;  Surgeon: Wellington Hampshire, MD;  Location: [place]  CATH LAB;  Service: Cardiovascular;  Laterality: N/A;  . ABDOMINAL AORTAGRAM N/A 08/29/2014   Procedure: ABDOMINAL Maxcine Ham;  Surgeon: Wellington Hampshire, MD;  Location: [place] CATH LAB;  Service: Cardiovascular;  Laterality: N/A;  . BACK SURGERY     1992  . CARDIAC CATHETERIZATION N/A 12/25/2015   Procedure: Left Heart Cath and Cors/Grafts Angiography;  Surgeon: Wellington Hampshire, MD;  Location: [place] CV LAB;  Service: Cardiovascular;  Laterality: N/A;  . CORONARY ARTERY BYPASS GRAFT  nov 2011   x 4  . ESOPHAGOGASTRODUODENOSCOPY (EGD) WITH PROPOFOL N/A 04/02/2017   Procedure: ESOPHAGOGASTRODUODENOSCOPY (EGD) WITH PROPOFOL;  Surgeon: Carol Ada, MD;  [facility];  Service: Endoscopy;  Laterality: N/A;  . HEMORRHOID SURGERY  10/30/2011   Procedure: HEMORRHOIDECTOMY;  Surgeon: Harl Bowie, MD;  Location: [place];  Service: General;  Laterality: N/A;  . JOINT REPLACEMENT    . pancreatic  cancer  05/10/2008   Resection of distal panrease and spleen  . PARTIAL KNEE ARTHROPLASTY Right 11/19/2014   Procedure: RIGHT UNI-KNEE ARTHROPLASTY MEDIALLY;  Surgeon: Mauri Pole, MD;  [facility];  Service: Orthopedics;  Laterality: Right;  . PERIPHERAL VASCULAR CATHETERIZATION  Oct 2014   Lt SFA PTA  . PERIPHERAL VASCULAR CATHETERIZATION  Oct 2015   ISR Lt SFA-PTA  .  right partial knee replacement    . splenectomy    . THROMBECTOMY FEMORAL ARTERY Left 09/13/2013   Procedure: THROMBECTOMY FEMORAL ARTERY;  Surgeon: Wellington Hampshire, MD;  Location: [place] CATH LAB;  Service: Cardiovascular;  Laterality: Left;    Medications reviewed. Current Outpatient Prescriptions  Medication Sig Dispense Refill  . aspirin EC 81 MG tablet Take 1 tablet (81 mg total) by mouth daily.    [name] atorvastatin (LIPITOR) 40 MG tablet Take 1 tablet (40 mg total) by mouth daily. (Patient taking differently: Take 40 mg by mouth at bedtime. ) 90 tablet 3  . cetirizine (ZYRTEC) 10 MG tablet Take 10 mg by mouth daily  as needed for allergies.    [name] colchicine 0.6 MG tablet Take 1 tablet (0.6 mg total) by mouth 2 (two) times daily. 30 tablet 0  . fish oil-omega-3 fatty acids 1000 MG capsule Take 1 capsule (1 g total) by mouth daily. 60 capsule 1  . gabapentin (NEURONTIN) 600 MG tablet Take 600 mg by mouth 3 (three) times daily.    . isosorbide mononitrate (IMDUR) 30 MG 24 hr tablet Take 1 tablet (30 mg total) by mouth daily. 90 tablet 3  . lisinopril (PRINIVIL,ZESTRIL) 5 MG tablet TAKE 0.5 TABLETS BY MOUTH  DAILY. 15 tablet 0  . metFORMIN (GLUCOPHAGE) 500 MG tablet TAKE 1 TABLET BY MOUTH 2 TIMES DAILY WITH A MEAL. 180 tablet 3  . metoprolol tartrate (LOPRESSOR) 25 MG tablet Take 1 tablet by mouth two  times daily 60 tablet 0  . Multiple Vitamin (MULTIVITAMIN) tablet Take 1 tablet by mouth daily. 30 tablet 10  . Pancrelipase, Lip-Prot-Amyl, (ZENPEP) 25000 units CPEP Take 1 capsule by mouth 3 (three) times daily before meals.    . pantoprazole (PROTONIX) 40 MG tablet Take 1 tablet (40 mg total) by mouth daily. 30 tablet 11  . tamsulosin (FLOMAX) 0.4 MG CAPS capsule Take 1 capsule (0.4 mg total) by mouth daily. 30 capsule 3  . nitroGLYCERIN (NITROSTAT) 0.4 MG SL tablet Place 1 tablet (0.4 mg total) under the tongue every 5 (five) minutes as needed for chest pain. 25 tablet 2   No current facility-administered medications for this visit.      Physical Exam:  BP 102/65   Pulse 64   Temp 98.4 F (36.9 C) (Oral)   Resp 16   Ht 5\' 9"  (1.753 m)   Wt 214 lb (97.1 kg)   SpO2 100%   BMI 31.60 kg/m  Gen:  Alert, cooperative patient who appears stated age in no acute distress.  Vital signs reviewed. HEENT: EOMI,  MMM Pulm:  Clear to auscultation bilaterally with good air movement.  No wheezes or rales noted.   Cardiac:  Regular rate and rhythm without murmur auscultated.  Good S1/S2.   Assessment and Plan:  1.  Erectile dysfunction 2.  Low testosterone:  Plan: - patient would like treatment for both of  this.  - states energy level is pretty good. Not sure he requires treatment specifically for his low testosterone without symptoms, though may help his ED - plan referral to urologist for further consultation for tx of ED and low testosterone in light of known CAD history.  On Imdur.  Hasn't needed nitro for "years." - patient agrees with plan.

## 2017-05-12 MED FILL — PANTOPRAZOLE SOD DR 40 MG T: 40 | 30 days supply | Qty: 30 | Fill #0

## 2017-05-14 ENCOUNTER — Ambulatory Visit (INDEPENDENT_AMBULATORY_CARE_PROVIDER_SITE_OTHER): Payer: Medicare Other | Admitting: Family Medicine

## 2017-05-14 ENCOUNTER — Encounter: Payer: Self-pay | Admitting: Family Medicine

## 2017-05-14 DIAGNOSIS — H5711 Ocular pain, right eye: Secondary | ICD-10-CM | POA: Diagnosis not present

## 2017-05-14 DIAGNOSIS — H01001 Unspecified blepharitis right upper eyelid: Secondary | ICD-10-CM

## 2017-05-14 DIAGNOSIS — H209 Unspecified iridocyclitis: Secondary | ICD-10-CM

## 2017-05-14 DIAGNOSIS — H01002 Unspecified blepharitis right lower eyelid: Secondary | ICD-10-CM

## 2017-05-14 DIAGNOSIS — H01003 Unspecified blepharitis right eye, unspecified eyelid: Secondary | ICD-10-CM | POA: Insufficient documentation

## 2017-05-14 DIAGNOSIS — H0100A Unspecified blepharitis right eye, upper and lower eyelids: Secondary | ICD-10-CM

## 2017-05-14 NOTE — Patient Instructions (Signed)
You have a potentially serious infection, and I am sending you straight over to Dr. Arlina Robes who will see you.  It is important to have this checked today.

## 2017-05-14 NOTE — Progress Notes (Signed)
Patient ID: Steven Hale., male    DOB: [birth year]  Age: 61 y.o. MRN: [id number]  Chief Complaint  Patient presents with  . Belepharitis    per patient, began appx 2 days ago    Subjective:   61 year old man who is here with a 2 day history of right eye pain. It's gotten worse with swelling of the eyelid. The pain is quite intense. He does have a history of wearing contacts, but he is not wearing them today. He has a history of a number of medical problems including diabetes, high blood pressure, high cholesterol, gout, heart disease.  Current allergies, medications, problem list, past/family and social histories reviewed.  Objective:  BP 124/77 (BP Location: Left Arm, Patient Position: Sitting, Cuff Size: Large)   Pulse 64   Temp 98.7 F (37.1 C) (Oral)   Resp 16   Ht 5\' 10"  (1.778 m)   Wt 215 lb 12.8 oz (97.9 kg)   SpO2 96%   BMI 30.96 kg/m   Right eyelids are very swollen. Left or not. Left conjunctiva is normal. Both eyes are very small pinpoint pupils, less than 1 mm. Red reflex is visible but I cannot see get a funduscopic exam. The cornea of the right eye has some haziness on the medial aspects in a splotchy fashion from about 2:00 to 4:00.  Assessment & Plan:   Assessment: 1. Uveitis   2. Eye pain, right   3. Blepharitis of both upper and lower eyelid of right eye, unspecified type       Plan: This appears to be an emergency. I called and spoke to Dr. Arlina Robes whom he is seen in the past, and he is to head on over there and see her. Actually had an appointment later today already.  No orders of the defined types were placed in this encounter.   No orders of the defined types were placed in this encounter.        Patient Instructions  You have a potentially serious infection, and I am sending you straight over to Dr. Arlina Robes who will see you.  It is important to have this checked today.    No Follow-up on file.   Nella Botsford, MD 05/14/2017

## 2017-06-03 MED FILL — AMOXICILLIN 875 MG TABLET: 875 | 7 days supply | Qty: 14 | Fill #0

## 2017-06-22 ENCOUNTER — Encounter: Payer: Self-pay | Admitting: Cardiovascular Disease

## 2017-06-22 ENCOUNTER — Ambulatory Visit (INDEPENDENT_AMBULATORY_CARE_PROVIDER_SITE_OTHER): Payer: Medicare Other | Admitting: Cardiovascular Disease

## 2017-06-22 VITALS — BP 104/66 | HR 65 | Ht 69.0 in | Wt 213.6 lb

## 2017-06-22 DIAGNOSIS — I25118 Atherosclerotic heart disease of native coronary artery with other forms of angina pectoris: Secondary | ICD-10-CM

## 2017-06-22 DIAGNOSIS — I739 Peripheral vascular disease, unspecified: Secondary | ICD-10-CM

## 2017-06-22 DIAGNOSIS — E784 Other hyperlipidemia: Secondary | ICD-10-CM | POA: Diagnosis not present

## 2017-06-22 DIAGNOSIS — E7849 Other hyperlipidemia: Secondary | ICD-10-CM

## 2017-06-22 DIAGNOSIS — I10 Essential (primary) hypertension: Secondary | ICD-10-CM

## 2017-06-22 NOTE — Progress Notes (Signed)
Cardiology Office Note   Date:  06/22/2017   ID:  [name], DOB 1956-06-30, MRN [id number]  PCP:  [name], MD  Cardiologist:  Dr. Jamesetta Geralds  Chief Complaint  Patient presents with  . Follow-up    Pt state no  Sx      History of Present Illness: Shelden Raborn. is a 61 y.o. male who presents for a followup visit regarding peripheral arterial disease and coronary artery disease. He has known history of coronary artery disease with multiple interventions in the past. He had CABG in 2011.  He quit smoking in 2009.  He has known history of peripheral arterial disease with intervention on both SFAs. Cardiac catheterization in January, 2017 showed mild nonobstructive LAD disease, occluded mid left circumflex and occluded proximal right coronary artery with left-to-right collaterals. SVG to RCA was occluded and LIMA to LAD was atretic. SVG to OM was normal. Abnormal stress test was due to chronically occluded right coronary artery and graft with left-to-right collaterals. His native RCA was not favorable for CTO PCI. I recommended medical therapy. Imdur was added.   He had worsening chest pain during last visit which was felt to be GI in nature. He was placed on Protonix and he followed up with gastroenterology. His symptoms resolved.  He is concerned about worsening feet discomfort described as sharp pain and tingling especially at night. These symptoms are not happening with exertion. He is known to have diabetic peripheral neuropathy and he is ready on gabapentin. He has been doubling the nighttime dose. He had vascular studies done in May which showed normal ABI and patent SFAs bilaterally.  Past Medical History:  Diagnosis Date  . Anemia, unspecified   . Bell's palsy   . Blood transfusion    during treatment for Ca  . Blood transfusion without reported diagnosis   . CAD (coronary artery disease)    a. s/p multiple PCIs;  b. s/p CABG in 09/2010 (LIMA-LAD,  SVG-OM1, SVG-distal RCA/OM2);  Cardiac cath in 12/2015: Mild LAD disease, occluded mid LCX and proximal RCA (left to right collaterals), Occluded SVG to RCA and atretic LIMA. Patent SVG to OM  . Chronic back pain   . Diabetes mellitus without complication ([place])   . DJD (degenerative joint disease)    low back & all over   . GERD (gastroesophageal reflux disease)   . Helicobacter pylori (H. pylori) infection   . Hemorrhoids   . Hyperlipidemia   . Hypertension   . Impotence of organic origin   . Myocardial infarction ([place])    2005 and 2012   . PAD (peripheral artery disease) ([place])    a. s/p bilat SFA stents;  b. ABIs 11/2012: R 0.99, L 0.86.  [name] Pancreatic cancer [facility])    surgery 2009  . Pre-diabetes 08/16/2015    Past Surgical History:  Procedure Laterality Date  . ABDOMINAL AORTAGRAM N/A 09/13/2013   Procedure: ABDOMINAL Maxcine Ham;  Surgeon: Wellington Hampshire, MD;  Location: [place] CATH LAB;  Service: Cardiovascular;  Laterality: N/A;  . ABDOMINAL AORTAGRAM N/A 08/29/2014   Procedure: ABDOMINAL Maxcine Ham;  Surgeon: Wellington Hampshire, MD;  Location: [place] CATH LAB;  Service: Cardiovascular;  Laterality: N/A;  . BACK SURGERY     1992  . CARDIAC CATHETERIZATION N/A 12/25/2015   Procedure: Left Heart Cath and Cors/Grafts Angiography;  Surgeon: Wellington Hampshire, MD;  Location: [place] CV LAB;  Service: Cardiovascular;  Laterality: N/A;  . CORONARY ARTERY BYPASS GRAFT  nov 2011   x 4  . ESOPHAGOGASTRODUODENOSCOPY (EGD) WITH PROPOFOL N/A 04/02/2017   Procedure: ESOPHAGOGASTRODUODENOSCOPY (EGD) WITH PROPOFOL;  Surgeon: Carol Ada, MD;  [facility];  Service: Endoscopy;  Laterality: N/A;  . HEMORRHOID SURGERY  10/30/2011   Procedure: HEMORRHOIDECTOMY;  Surgeon: Harl Bowie, MD;  Location: [place];  Service: General;  Laterality: N/A;  . JOINT REPLACEMENT    . pancreatic  cancer  05/10/2008   Resection of distal panrease and spleen  . PARTIAL KNEE ARTHROPLASTY Right 11/19/2014    Procedure: RIGHT UNI-KNEE ARTHROPLASTY MEDIALLY;  Surgeon: Mauri Pole, MD;  [facility];  Service: Orthopedics;  Laterality: Right;  . PERIPHERAL VASCULAR CATHETERIZATION  Oct 2014   Lt SFA PTA  . PERIPHERAL VASCULAR CATHETERIZATION  Oct 2015   ISR Lt SFA-PTA  . right partial knee replacement    . splenectomy    . THROMBECTOMY FEMORAL ARTERY Left 09/13/2013   Procedure: THROMBECTOMY FEMORAL ARTERY;  Surgeon: Wellington Hampshire, MD;  Location: [place] CATH LAB;  Service: Cardiovascular;  Laterality: Left;     Current Outpatient Prescriptions  Medication Sig Dispense Refill  . aspirin EC 81 MG tablet Take 1 tablet (81 mg total) by mouth daily.    [name] atorvastatin (LIPITOR) 40 MG tablet Take 1 tablet (40 mg total) by mouth daily. (Patient taking differently: Take 40 mg by mouth at bedtime. ) 90 tablet 3  . fish oil-omega-3 fatty acids 1000 MG capsule Take 1 capsule (1 g total) by mouth daily. 60 capsule 1  . gabapentin (NEURONTIN) 600 MG tablet Take 600 mg by mouth 3 (three) times daily.    . isosorbide mononitrate (IMDUR) 30 MG 24 hr tablet Take 1 tablet (30 mg total) by mouth daily. 90 tablet 3  . lisinopril (PRINIVIL,ZESTRIL) 5 MG tablet TAKE 0.5 TABLETS BY MOUTH  DAILY. 15 tablet 0  . metFORMIN (GLUCOPHAGE) 500 MG tablet TAKE 1 TABLET BY MOUTH 2 TIMES DAILY WITH A MEAL. 180 tablet 3  . metoprolol tartrate (LOPRESSOR) 25 MG tablet Take 1 tablet by mouth two  times daily 60 tablet 0  . Multiple Vitamin (MULTIVITAMIN) tablet Take 1 tablet by mouth daily. 30 tablet 10  . Pancrelipase, Lip-Prot-Amyl, (ZENPEP) 25000 units CPEP Take 1 capsule by mouth 3 (three) times daily before meals.    . pantoprazole (PROTONIX) 40 MG tablet Take 1 tablet (40 mg total) by mouth daily. 30 tablet 11  . tamsulosin (FLOMAX) 0.4 MG CAPS capsule Take 1 capsule (0.4 mg total) by mouth daily. 30 capsule 3  . nitroGLYCERIN (NITROSTAT) 0.4 MG SL tablet Place 1 tablet (0.4 mg total) under the tongue every 5 (five)  minutes as needed for chest pain. 25 tablet 2   No current facility-administered medications for this visit.     Allergies:   Patient has no known allergies.    Social History:  The patient  reports that he quit smoking about 8 years ago. His smoking use included Cigarettes. He has never used smokeless tobacco. He reports that he drinks about 1.8 oz of alcohol per week . He reports that he does not use drugs.   Family History:  The patient's family history includes Cancer in his father; Heart attack in his father.    ROS:  Please see the history of present illness.   Otherwise, review of systems are positive for none.   All other systems are reviewed and negative.    PHYSICAL EXAM: VS:  BP 104/66   Pulse  65   Ht 5\' 9"  (1.753 m)   Wt 213 lb 9.6 oz (96.9 kg)   BMI 31.54 kg/m  , BMI Body mass index is 31.54 kg/m. GEN: Well nourished, well developed, in no acute distress  HEENT: normal  Neck: no JVD, carotid bruits, or masses Cardiac: RRR; no murmurs, rubs, or gallops,no edema  Respiratory:  clear to auscultation bilaterally, normal work of breathing GI: soft, nontender, nondistended, + BS MS: no deformity or atrophy  Skin: warm and dry, no rash Neuro:  Strength and sensation are intact Psych: euthymic mood, full affect Distal pulses are not palpable.  EKG:  EKG is ordered today. EKG showed sinus bradycardia with a heart rate of 59 bpm. No significant ST or T wave changes.   Recent Labs: 10/13/2016: Platelets 275 03/17/2017: ALT 24; BUN 16; Creatinine, Ser 0.98; Hemoglobin 13.0; Potassium 4.9; Sodium 140    Lipid Panel    Component Value Date/Time   CHOL 145 04/08/2017 1126   TRIG 131 04/08/2017 1126   HDL 44 04/08/2017 1126   CHOLHDL 3.3 04/08/2017 1126   CHOLHDL 3.2 08/17/2016 0925   VLDL 28 08/17/2016 0925   LDLCALC 75 04/08/2017 1126   LDLDIRECT 80 11/06/2013 0925      Wt Readings from Last 3 Encounters:  06/22/17 213 lb 9.6 oz (96.9 kg)  05/14/17 215 lb  12.8 oz (97.9 kg)  04/28/17 214 lb (97.1 kg)        ASSESSMENT AND PLAN:   1. Peripheral arterial disease: Previous bilateral SFA intervention.  Vascular studies and management showed normal ABI and patent SFA.  2. Peripheral diabetic neuropathy: His current symptoms are not consistent with claudication and are more suggestive of neuropathy likely due to diabetes. Given that most of his symptoms aren't night, he can increase the bedtime dose of gabapentin. I asked him to follow-up with his primary care physician as he might require some alternative medications such as Lyrica or amitriptyline  3. Coronary artery disease involving native coronary arteries with stable angina:  Symptoms are well-controlled with medications.  4. Essential hypertension: Blood pressure is controlled on current medications.  5. Hyperlipidemia:  Continue treatment with atorvastatin 40 mg once daily. Most recent LDL was 72 which is close to target.   Disposition:   FU with me in 6 months  Signed,  Kathlyn Sacramento, MD  06/22/2017 11:28 AM    [place]

## 2017-06-22 NOTE — Patient Instructions (Signed)
NO CHANGE WITH CURRENT MEDICATIONS    Your physician wants you to follow-up in [place].You will receive a reminder letter in the mail two months in advance. If you don't receive a letter, please call our office to schedule the follow-up appointment.   If you need a refill on your cardiac medications before your next appointment, please call your pharmacy.

## 2017-06-29 ENCOUNTER — Ambulatory Visit: Payer: Medicare Other | Admitting: Cardiovascular Disease

## 2017-07-07 MED FILL — AMOXICILLIN 500 MG CAPSULE: 500 | 4 days supply | Qty: 16 | Fill #0

## 2017-07-13 ENCOUNTER — Ambulatory Visit (INDEPENDENT_AMBULATORY_CARE_PROVIDER_SITE_OTHER): Payer: Medicare Other | Admitting: Family Medicine

## 2017-07-13 ENCOUNTER — Encounter: Payer: Self-pay | Admitting: Family Medicine

## 2017-07-13 VITALS — BP 116/74 | HR 53 | Temp 98.1°F | Resp 18 | Ht 69.0 in | Wt 216.0 lb

## 2017-07-13 DIAGNOSIS — R7303 Prediabetes: Secondary | ICD-10-CM

## 2017-07-13 NOTE — Patient Instructions (Signed)
     IF you received an x-ray today, you will receive an invoice from [facility]. Please contact [facility] at [phone] with questions or concerns regarding your invoice.   IF you received labwork today, you will receive an invoice from [facility]. Please contact [facility] at [phone] with questions or concerns regarding your invoice.   Our billing staff will not be able to assist you with questions regarding bills from these companies.  You will be contacted with the lab results as soon as they are available. The fastest way to get your results is to activate your My Chart account. Instructions are located on the last page of this paperwork. If you have not heard from us regarding the results in 2 weeks, please contact this office.     

## 2017-07-13 NOTE — Progress Notes (Deleted)
Chief Complaint  Patient presents with  . A1C CHECK    [employer] called with patient on the phone and he needs his blood sugar checked    HPI    Past Medical History:  Diagnosis Date  . Anemia, unspecified   . Bell's palsy   . Blood transfusion    during treatment for Ca  . Blood transfusion without reported diagnosis   . CAD (coronary artery disease)    a. s/p multiple PCIs;  b. s/p CABG in 09/2010 (LIMA-LAD, SVG-OM1, SVG-distal RCA/OM2);  Cardiac cath in 12/2015: Mild LAD disease, occluded mid LCX and proximal RCA (left to right collaterals), Occluded SVG to RCA and atretic LIMA. Patent SVG to OM  . Chronic back pain   . Diabetes mellitus without complication ([place])   . DJD (degenerative joint disease)    low back & all over   . GERD (gastroesophageal reflux disease)   . Helicobacter pylori (H. pylori) infection   . Hemorrhoids   . Hyperlipidemia   . Hypertension   . Impotence of organic origin   . Myocardial infarction ([place])    2005 and 2012   . PAD (peripheral artery disease) ([place])    a. s/p bilat SFA stents;  b. ABIs 11/2012: R 0.99, L 0.86.  [name] Pancreatic cancer [facility])    surgery 2009  . Pre-diabetes 08/16/2015    Current Outpatient Prescriptions  Medication Sig Dispense Refill  . aspirin EC 81 MG tablet Take 1 tablet (81 mg total) by mouth daily.    [name] atorvastatin (LIPITOR) 40 MG tablet Take 1 tablet (40 mg total) by mouth daily. (Patient taking differently: Take 40 mg by mouth at bedtime. ) 90 tablet 3  . gabapentin (NEURONTIN) 600 MG tablet Take 600 mg by mouth 3 (three) times daily.    . isosorbide mononitrate (IMDUR) 30 MG 24 hr tablet Take 1 tablet (30 mg total) by mouth daily. 90 tablet 3  . lisinopril (PRINIVIL,ZESTRIL) 5 MG tablet TAKE 0.5 TABLETS BY MOUTH  DAILY. 15 tablet 0  . metFORMIN (GLUCOPHAGE) 500 MG tablet TAKE 1 TABLET BY MOUTH 2 TIMES DAILY WITH A MEAL. 180 tablet 3  . metoprolol tartrate (LOPRESSOR) 25 MG tablet Take 1 tablet by mouth two  times daily 60  tablet 0  . Multiple Vitamin (MULTIVITAMIN) tablet Take 1 tablet by mouth daily. 30 tablet 10  . Pancrelipase, Lip-Prot-Amyl, (ZENPEP) 25000 units CPEP Take 1 capsule by mouth 3 (three) times daily before meals.    . pantoprazole (PROTONIX) 40 MG tablet Take 1 tablet (40 mg total) by mouth daily. 30 tablet 11  . tamsulosin (FLOMAX) 0.4 MG CAPS capsule Take 1 capsule (0.4 mg total) by mouth daily. 30 capsule 3  . fish oil-omega-3 fatty acids 1000 MG capsule Take 1 capsule (1 g total) by mouth daily. 60 capsule 1  . nitroGLYCERIN (NITROSTAT) 0.4 MG SL tablet Place 1 tablet (0.4 mg total) under the tongue every 5 (five) minutes as needed for chest pain. 25 tablet 2   No current facility-administered medications for this visit.     Allergies: No Known Allergies  Past Surgical History:  Procedure Laterality Date  . ABDOMINAL AORTAGRAM N/A 09/13/2013   Procedure: ABDOMINAL Maxcine Ham;  Surgeon: Wellington Hampshire, MD;  Location: [place] CATH LAB;  Service: Cardiovascular;  Laterality: N/A;  . ABDOMINAL AORTAGRAM N/A 08/29/2014   Procedure: ABDOMINAL Maxcine Ham;  Surgeon: Wellington Hampshire, MD;  Location: [place] CATH LAB;  Service: Cardiovascular;  Laterality: N/A;  . BACK  SURGERY     1992  . CARDIAC CATHETERIZATION N/A 12/25/2015   Procedure: Left Heart Cath and Cors/Grafts Angiography;  Surgeon: Wellington Hampshire, MD;  Location: [place] CV LAB;  Service: Cardiovascular;  Laterality: N/A;  . CORONARY ARTERY BYPASS GRAFT  nov 2011   x 4  . ESOPHAGOGASTRODUODENOSCOPY (EGD) WITH PROPOFOL N/A 04/02/2017   Procedure: ESOPHAGOGASTRODUODENOSCOPY (EGD) WITH PROPOFOL;  Surgeon: Carol Ada, MD;  [facility];  Service: Endoscopy;  Laterality: N/A;  . HEMORRHOID SURGERY  10/30/2011   Procedure: HEMORRHOIDECTOMY;  Surgeon: Harl Bowie, MD;  Location: [place];  Service: General;  Laterality: N/A;  . JOINT REPLACEMENT    . pancreatic  cancer  05/10/2008   Resection of distal panrease and spleen  .  PARTIAL KNEE ARTHROPLASTY Right 11/19/2014   Procedure: RIGHT UNI-KNEE ARTHROPLASTY MEDIALLY;  Surgeon: Mauri Pole, MD;  [facility];  Service: Orthopedics;  Laterality: Right;  . PERIPHERAL VASCULAR CATHETERIZATION  Oct 2014   Lt SFA PTA  . PERIPHERAL VASCULAR CATHETERIZATION  Oct 2015   ISR Lt SFA-PTA  . right partial knee replacement    . splenectomy    . THROMBECTOMY FEMORAL ARTERY Left 09/13/2013   Procedure: THROMBECTOMY FEMORAL ARTERY;  Surgeon: Wellington Hampshire, MD;  Location: [place] CATH LAB;  Service: Cardiovascular;  Laterality: Left;    Social History   Social History  . Marital status: Single    Spouse name: N/A  . Number of children: 0  . Years of education: N/A   Occupational History  . Diability Unemployed   Social History Main Topics  . Smoking status: Former Smoker    Types: Cigarettes    Quit date: 11/29/2008  . Smokeless tobacco: Never Used  . Alcohol use 1.8 oz/week    3 Shots of liquor per week     Comment: 2 drinks of brandy every week   . Drug use: No  . Sexual activity: Not Currently   Other Topics Concern  . 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   [place]

## 2017-07-13 NOTE — Progress Notes (Signed)
Patient left without being seen by provider.

## 2017-07-20 ENCOUNTER — Telehealth: Payer: Self-pay | Admitting: Cardiology

## 2017-07-20 NOTE — Telephone Encounter (Signed)
Pt advised, verbalized understanding and thanks. Aware to call again if new concerns. He'll follow up w primary care in October.  Med list updated.

## 2017-07-20 NOTE — Telephone Encounter (Signed)
Steven Hale is calling to see how longer will he need to take Isosorbide. States that Dr. Stanford Breed said that he will not have to be on the medication for a long time but it has been a little over a year that he has been on it . Please call

## 2017-07-20 NOTE — Telephone Encounter (Signed)
Ok to [employer]

## 2017-07-20 NOTE — Telephone Encounter (Signed)
Pt of Dr. Stanford Breed, Dr. Fletcher Anon  Returned call to patient. He states in the past, he believes he had conversation w Dr. Stanford Breed regarding use of isosorbide. Pt states he was under the impression that he would not need to take this medication long-term. He has been taking over a year, and not sure if he still needs this medication. States he is also asking for the purpose of being able to take male enhancement medications and was told by PCP he wouldn't be able to if on nitrates.  Pt aware I will route to Dr. Stanford Breed for any advice and f/u.

## 2017-07-22 ENCOUNTER — Telehealth: Payer: Self-pay | Admitting: *Deleted

## 2017-07-22 NOTE — Telephone Encounter (Signed)
Patient is having trouble with erectile dysfunction and since he is on isosorbide he is not a candidate for PDE5 therapy. They are asking if it is safe for the patient to stop the isosorbide for a trial of sildenafil. Dr Stanford Breed reviewed and recommended the patient stop the isosorbide and NTG. Paperwork faxed back to the number provided.

## 2017-08-02 MED FILL — IBUPROFEN 600 MG TABLET: 600 | 3 days supply | Qty: 12 | Fill #0

## 2017-08-02 MED FILL — METHYLPREDNISOLONE 4 MG TAB: 4 | 6 days supply | Qty: 21 | Fill #0

## 2017-08-02 MED FILL — AMOXICILLIN 875 MG TABLET: 875 | 7 days supply | Qty: 14 | Fill #0

## 2017-08-11 ENCOUNTER — Telehealth: Payer: Self-pay | Admitting: Family Medicine

## 2017-08-11 ENCOUNTER — Ambulatory Visit: Payer: Medicare Other | Admitting: Family Medicine

## 2017-08-11 NOTE — Telephone Encounter (Signed)
Pt needs a refill on his tamsulosin.  Dr. Mingo Amber filled this for him originally

## 2017-08-13 MED ORDER — TAMSULOSIN HCL 0.4 MG PO CAPS: 0.4000 mg | ORAL_CAPSULE | Freq: Every day | ORAL | 0 refills | Status: DC

## 2017-08-13 MED FILL — AMOXICILLIN 875 MG TABLET: 875 | 7 days supply | Qty: 14 | Fill #0

## 2017-08-13 MED FILL — METHYLPREDNISOLONE 4 MG TAB: 4 | 6 days supply | Qty: 21 | Fill #0

## 2017-08-13 MED FILL — IBUPROFEN 600 MG TABLET: 600 | 3 days supply | Qty: 12 | Fill #0

## 2017-08-13 MED FILL — TAMSULOSIN HCL 0.4 MG CAP: 0.4 | 90 days supply | Qty: 90 | Fill #0

## 2017-08-13 NOTE — Telephone Encounter (Signed)
refill req. For tamsulosin (FLOMAX) 0.4 MG CAPS capsule [[id number]]// pt is completely out of rx  [facility]  [phone] please call to consult

## 2017-08-13 NOTE — Telephone Encounter (Signed)
Please review

## 2017-08-13 NOTE — Telephone Encounter (Signed)
Dr. Nolon Rod is the PCP, but is out of the office until 08/16/2017.  30-day supply sent to his pharmacy. He needs an office visit.  Meds ordered this encounter  Medications  . DISCONTD: tamsulosin (FLOMAX) 0.4 MG CAPS capsule    Sig: Take 1 capsule (0.4 mg total) by mouth daily. Office visit needed for additional refills. 1st notice.    Dispense:  90 capsule    Refill:  0    Order Specific Question:   Supervising Provider    Answer:   SHAW, EVA N [[id number]]  . tamsulosin (FLOMAX) 0.4 MG CAPS capsule    Sig: Take 1 capsule (0.4 mg total) by mouth daily.    Dispense:  30 capsule    Refill:  0    Please notify patient that s/he needs an office visit +/- labsfor additional refills. 2nd notice.    Order Specific Question:   Supervising Provider    Answer:   Shawnee Knapp [phone]

## 2017-08-13 NOTE — Telephone Encounter (Signed)
LMOM Tamsulosin refill sent, advised ofc vs needed before additional refills.

## 2017-08-13 NOTE — Telephone Encounter (Signed)
I have not seen patient for this and he has not established a PCP. Patient needs to do so. I'll provide a 90 day supply. Follow up thereafter for more refills.

## 2017-09-08 ENCOUNTER — Other Ambulatory Visit: Payer: Self-pay | Admitting: *Deleted

## 2017-09-08 DIAGNOSIS — I739 Peripheral vascular disease, unspecified: Secondary | ICD-10-CM

## 2017-09-21 MED FILL — PANTOPRAZOLE SOD DR 40 MG T: 40 | 30 days supply | Qty: 30 | Fill #1

## 2017-11-10 MED FILL — TAMSULOSIN HCL 0.4 MG CAP: 0.4 | 30 days supply | Qty: 30 | Fill #0

## 2017-11-11 ENCOUNTER — Encounter: Payer: Self-pay | Admitting: Family Medicine

## 2017-11-11 ENCOUNTER — Other Ambulatory Visit: Payer: Self-pay

## 2017-11-11 ENCOUNTER — Ambulatory Visit (INDEPENDENT_AMBULATORY_CARE_PROVIDER_SITE_OTHER): Payer: Medicare Other | Admitting: Family Medicine

## 2017-11-11 VITALS — BP 112/68 | HR 80 | Temp 98.0°F | Resp 18 | Ht 69.0 in | Wt 220.6 lb

## 2017-11-11 DIAGNOSIS — R7303 Prediabetes: Secondary | ICD-10-CM | POA: Diagnosis not present

## 2017-11-11 DIAGNOSIS — R252 Cramp and spasm: Secondary | ICD-10-CM | POA: Diagnosis not present

## 2017-11-11 DIAGNOSIS — G2581 Restless legs syndrome: Secondary | ICD-10-CM

## 2017-11-11 DIAGNOSIS — D649 Anemia, unspecified: Secondary | ICD-10-CM | POA: Diagnosis not present

## 2017-11-11 DIAGNOSIS — G629 Polyneuropathy, unspecified: Secondary | ICD-10-CM | POA: Diagnosis not present

## 2017-11-11 NOTE — Patient Instructions (Addendum)
     IF you received an x-ray today, you will receive an invoice from [facility]. Please contact [facility] at [phone] with questions or concerns regarding your invoice.   IF you received labwork today, you will receive an invoice from [facility]. Please contact [facility] at [phone] with questions or concerns regarding your invoice.   Our billing staff will not be able to assist you with questions regarding bills from these companies.  You will be contacted with the lab results as soon as they are available. The fastest way to get your results is to activate your My Chart account. Instructions are located on the last page of this paperwork. If you have not heard from us regarding the results in 2 weeks, please contact this office.     Leg Cramps Leg cramps occur when a muscle or muscles tighten and you have no control over this tightening (involuntary muscle contraction). Muscle cramps can develop in any muscle, but the most common place is in the calf muscles of the leg. Those cramps can occur during exercise or when you are at rest. Leg cramps are painful, and they may last for a few seconds to a few minutes. Cramps may return several times before they finally stop. Usually, leg cramps are not caused by a serious medical problem. In many cases, the cause is not known. Some common causes include:  Overexertion.  Overuse from repetitive motions, or doing the same thing over and over.  Remaining in a certain position for a long period of time.  Improper preparation, form, or technique while performing a sport or an activity.  Dehydration.  Injury.  Side effects of some medicines.  Abnormally low levels of the salts and ions in your blood (electrolytes), especially potassium and calcium. These levels could be low if you are taking water pills (diuretics) or if you are pregnant.  Follow these instructions at home: Watch your condition for any changes. Taking  the following actions may help to lessen any discomfort that you are feeling:  Stay well-hydrated. Drink enough fluid to keep your urine clear or pale yellow.  Try massaging, stretching, and relaxing the affected muscle. Do this for several minutes at a time.  For tight or tense muscles, use a warm towel, heating pad, or hot shower water directed to the affected area.  If you are sore or have pain after a cramp, applying ice to the affected area may relieve discomfort. ? Put ice in a plastic bag. ? Place a towel between your skin and the bag. ? Leave the ice on for 20 minutes, 2-3 times per day.  Avoid strenuous exercise for several days if you have been having frequent leg cramps.  Make sure that your diet includes the essential minerals for your muscles to work normally.  Take medicines only as directed by your health care provider.  Contact a health care provider if:  Your leg cramps get more severe or more frequent, or they do not improve over time.  Your foot becomes cold, numb, or blue. This information is not intended to replace advice given to you by your health care provider. Make sure you discuss any questions you have with your health care provider. Document Released: 12/10/2004 Document Revised: 04/09/2016 Document Reviewed: 10/10/2014 Elsevier Interactive Patient Education  2018 [employer]  

## 2017-11-11 NOTE — Progress Notes (Signed)
Chief Complaint  Patient presents with  . Blood work    Pt is requesting blood work for hypothyroidism and Grave's disease. Pt states his symptoms match with these Dx. Pt states his symptoms are weight gain, gray hair, and diabetes.    HPI   Pt reports that he has a history of neuropathy with pins and needles sensation  He reports that he has been having difficulty with weight gain  He also struggles with constipation Wt Readings from Last 3 Encounters:  11/11/17 220 lb 9.6 oz (100.1 kg)  07/13/17 216 lb (98 kg)  06/22/17 213 lb 9.6 oz (96.9 kg)   He reports that he gets skin changes, brittle  Nails, restless leg symptoms at night Burning sensation in his feet, feeling like he is walking on needles Hair changes with whitening of the hair and growing in patches He also denies depression, anxiety and  He drinks some sleeping cannibus oil to help with his sleeping He does not smoke He is not a vegetarian 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 Anemia, unspecified   . Bell's palsy   . Blood transfusion    during treatment for Ca  . Blood transfusion without reported diagnosis   . CAD (coronary artery disease)    a. s/p multiple PCIs;  b. s/p CABG in 09/2010 (LIMA-LAD, SVG-OM1, SVG-distal RCA/OM2);  Cardiac cath in 12/2015: Mild LAD disease, occluded mid LCX and proximal RCA (left to right collaterals), Occluded SVG to RCA and atretic LIMA. Patent SVG to OM  . Chronic back pain   . Diabetes mellitus without complication ([place])   . DJD (degenerative joint disease)    low back & all over   . GERD (gastroesophageal reflux disease)   . Helicobacter pylori (H. pylori) infection   . Hemorrhoids   . Hyperlipidemia   . Hypertension   . Impotence of organic origin   . Myocardial infarction ([place])    2005 and 2012   . PAD (peripheral artery disease) ([place])    a. s/p bilat SFA stents;  b. ABIs 11/2012: R 0.99, L 0.86.  [name] Pancreatic cancer [facility])    surgery 2009  . Pre-diabetes 08/16/2015    Current Outpatient Medications  Medication Sig Dispense Refill  . aspirin EC 81 MG tablet Take 1 tablet (81 mg total) by mouth daily.    [name] atorvastatin (LIPITOR) 40 MG tablet Take 1 tablet (40 mg total) by mouth daily. (Patient taking differently: Take 40 mg by mouth at bedtime. ) 90 tablet 3  . fish oil-omega-3 fatty acids 1000 MG capsule Take 1 capsule (1 g total) by mouth daily. 60 capsule 1  . gabapentin (NEURONTIN) 600 MG tablet Take 600 mg by mouth 3 (three) times daily.    [name] lisinopril (PRINIVIL,ZESTRIL) 5 MG tablet TAKE 0.5 TABLETS BY MOUTH  DAILY. 15 tablet 0  . metFORMIN (GLUCOPHAGE) 500 MG tablet TAKE 1 TABLET BY MOUTH 2 TIMES DAILY WITH A MEAL. 180 tablet 3  . metoprolol tartrate (LOPRESSOR) 25 MG tablet Take 1 tablet by mouth two  times daily 60 tablet 0  . Multiple Vitamin (MULTIVITAMIN) tablet Take 1 tablet by mouth daily. 30 tablet 10  . pantoprazole (PROTONIX) 40 MG tablet Take 1 tablet (40 mg total) by mouth daily. 30 tablet 11  . tamsulosin (FLOMAX) 0.4 MG CAPS capsule Take 1 capsule (0.4 mg total) by mouth daily. 30 capsule 0  . nitroGLYCERIN (NITROSTAT) 0.4 MG SL tablet Place  1 tablet (0.4 mg total) under the tongue every 5 (five) minutes as needed for chest pain. 25 tablet 2  . Pancrelipase, Lip-Prot-Amyl, (ZENPEP) 25000 units CPEP Take 1 capsule by mouth 3 (three) times daily before meals.     No current facility-administered medications for this visit.     Allergies: No Known Allergies  Past Surgical History:  Procedure Laterality Date  . ABDOMINAL AORTAGRAM N/A 09/13/2013   Procedure: ABDOMINAL Maxcine Ham;  Surgeon: Wellington Hampshire, MD;  Location: [place] CATH LAB;  Service: Cardiovascular;  Laterality: N/A;  . ABDOMINAL AORTAGRAM N/A 08/29/2014   Procedure: ABDOMINAL Maxcine Ham;  Surgeon: Wellington Hampshire, MD;  Location: [place] CATH LAB;  Service: Cardiovascular;  Laterality: N/A;  . BACK SURGERY     1992  . CARDIAC  CATHETERIZATION N/A 12/25/2015   Procedure: Left Heart Cath and Cors/Grafts Angiography;  Surgeon: Wellington Hampshire, MD;  Location: [place] CV LAB;  Service: Cardiovascular;  Laterality: N/A;  . CORONARY ARTERY BYPASS GRAFT  nov 2011   x 4  . ESOPHAGOGASTRODUODENOSCOPY (EGD) WITH PROPOFOL N/A 04/02/2017   Procedure: ESOPHAGOGASTRODUODENOSCOPY (EGD) WITH PROPOFOL;  Surgeon: Carol Ada, MD;  [facility];  Service: Endoscopy;  Laterality: N/A;  . HEMORRHOID SURGERY  10/30/2011   Procedure: HEMORRHOIDECTOMY;  Surgeon: Harl Bowie, MD;  Location: [place];  Service: General;  Laterality: N/A;  . JOINT REPLACEMENT    . pancreatic  cancer  05/10/2008   Resection of distal panrease and spleen  . PARTIAL KNEE ARTHROPLASTY Right 11/19/2014   Procedure: RIGHT UNI-KNEE ARTHROPLASTY MEDIALLY;  Surgeon: Mauri Pole, MD;  [facility];  Service: Orthopedics;  Laterality: Right;  . PERIPHERAL VASCULAR CATHETERIZATION  Oct 2014   Lt SFA PTA  . PERIPHERAL VASCULAR CATHETERIZATION  Oct 2015   ISR Lt SFA-PTA  . right partial knee replacement    . splenectomy    . THROMBECTOMY FEMORAL ARTERY Left 09/13/2013   Procedure: THROMBECTOMY FEMORAL ARTERY;  Surgeon: Wellington Hampshire, MD;  Location: [place] CATH LAB;  Service: Cardiovascular;  Laterality: Left;    Social History   Socioeconomic History  . Marital status: Single    Spouse name: None  . Number of children: 0  . Years of education: None  . Highest education level: None  Social Needs  . Financial resource strain: None  . Food insecurity - worry: None  . Food insecurity - inability: None  . Transportation needs - medical: None  . Transportation needs - non-medical: None  Occupational History  . Occupation: TEFL teacher: UNEMPLOYED  Tobacco Use  . Smoking status: Former Smoker    Types: Cigarettes    Last attempt to quit: 11/29/2008    Years since quitting: 8.9  . Smokeless tobacco: Never Used  Substance and  Sexual Activity  . Alcohol use: Yes    Alcohol/week: 1.8 oz    Types: 3 Shots of liquor per week    Comment: 2 drinks of brandy every week   . Drug use: No  . Sexual activity: Not Currently  Other Topics Concern  . None  Social History Narrative   He works at the night shift at [employer] part time   [employer] [phone]   Single, no children    Family History  Problem Relation Age of Onset  . Heart attack Father        died of MI at age 39  . Cancer Father   . Anesthesia problems Neg Hx   .  Hypotension Neg Hx   . Malignant hyperthermia Neg Hx   . Pseudochol deficiency Neg Hx   . Colon cancer Neg Hx   . Esophageal cancer Neg Hx   . Prostate cancer Neg Hx   . Rectal cancer Neg Hx   . Stomach cancer Neg Hx      ROS Review of Systems See HPI Constitution: No fevers or chills No malaise No diaphoresis Skin: No rash or itching Eyes: no blurry vision, no double vision GU: no dysuria or hematuria Neuro: no dizziness or headaches all others reviewed and negative   Objective: Vitals:   11/11/17 1607  BP: 112/68  Pulse: 80  Resp: 18  Temp: 98 F (36.7 C)  TempSrc: Oral  SpO2: 97%  Weight: 220 lb 9.6 oz (100.1 kg)  Height: 5\' 9"  (1.753 m)    Physical Exam  Constitutional: He is oriented to person, place, and time. He appears well-developed and well-nourished.  HENT:  Head: Normocephalic and atraumatic.  Eyes: Conjunctivae and EOM are normal.  Cardiovascular: Normal rate, regular rhythm and normal heart sounds.  No murmur heard. Pulmonary/Chest: Effort normal and breath sounds normal. No stridor. No respiratory distress.  Musculoskeletal: Normal range of motion. He exhibits no edema.  Neurological: He is alert and oriented to person, place, and time.  Skin: Skin is warm. Capillary refill takes less than 2 seconds.  Psychiatric: He has a normal mood and affect. His behavior is normal. Judgment and thought content normal.    Assessment and Plan Steven Hale was  seen today for blood work.  Diagnoses and all orders for this visit:  Muscle cramps -     TSH + free T4 -     Comprehensive metabolic panel  Restless leg syndrome- will check for contributing factors -     TSH + free T4 -     Iron, TIBC and Ferritin Panel  Prediabetes- last levels 6.2 Will recheck -     Hemoglobin A1c  Anemia, unspecified type- will check -     TSH + free T4 -     Iron, TIBC and Ferritin Panel -     CBC  Neuropathy- pt describing neuropathy Will check for contributing causes -     CBC -     Comprehensive metabolic panel     Coden Franchi A Shaylee Stanislawski

## 2017-11-12 LAB — COMPREHENSIVE METABOLIC PANEL
ALBUMIN: 4.9 g/dL — AB (ref 3.6–4.8)
ALK PHOS: 86 IU/L (ref 39–117)
ALT: 28 IU/L (ref 0–44)
AST: 24 IU/L (ref 0–40)
Albumin/Globulin Ratio: 2 (ref 1.2–2.2)
BUN / CREAT RATIO: 15 (ref 10–24)
BUN: 11 mg/dL (ref 8–27)
Bilirubin Total: 0.3 mg/dL (ref 0.0–1.2)
CO2: 26 mmol/L (ref 20–29)
CREATININE: 0.73 mg/dL — AB (ref 0.76–1.27)
Calcium: 10.3 mg/dL — ABNORMAL HIGH (ref 8.6–10.2)
Chloride: 103 mmol/L (ref 96–106)
GFR, EST AFRICAN AMERICAN: 116 mL/min/{1.73_m2} (ref >59)
GFR, EST NON AFRICAN AMERICAN: 100 mL/min/{1.73_m2} (ref >59)
GLOBULIN, TOTAL: 2.5 g/dL (ref 1.5–4.5)
Glucose: 147 mg/dL — ABNORMAL HIGH (ref 65–99)
Potassium: 3.9 mmol/L (ref 3.5–5.2)
SODIUM: 146 mmol/L — AB (ref 134–144)
Total Protein: 7.4 g/dL (ref 6.0–8.5)

## 2017-11-12 LAB — CBC
HEMATOCRIT: 37.1 % — AB (ref 37.5–51.0)
HEMOGLOBIN: 12.6 g/dL — AB (ref 13.0–17.7)
MCH: 29.9 pg (ref 26.6–33.0)
MCHC: 34 g/dL (ref 31.5–35.7)
MCV: 88 fL (ref 79–97)
Platelets: 299 10*3/uL (ref 150–379)
RBC: 4.22 x10E6/uL (ref 4.14–5.80)
RDW: 14.5 % (ref 12.3–15.4)
WBC: 4.1 10*3/uL (ref 3.4–10.8)

## 2017-11-12 LAB — TSH+FREE T4
FREE T4: 0.94 ng/dL (ref 0.82–1.77)
TSH: 1.42 u[IU]/mL (ref 0.450–4.500)

## 2017-11-12 LAB — IRON,TIBC AND FERRITIN PANEL
FERRITIN: 140 ng/mL (ref 30–400)
Iron Saturation: 21 % (ref 15–55)
Iron: 83 ug/dL (ref 38–169)
Total Iron Binding Capacity: 397 ug/dL (ref 250–450)
UIBC: 314 ug/dL (ref 111–343)

## 2017-11-12 LAB — HEMOGLOBIN A1C
Est. average glucose Bld gHb Est-mCnc: 148 mg/dL
HEMOGLOBIN A1C: 6.8 % — AB (ref 4.8–5.6)

## 2017-11-24 ENCOUNTER — Telehealth: Payer: Self-pay | Admitting: Family Medicine

## 2017-11-24 NOTE — Progress Notes (Signed)
Chief Complaint  Patient presents with  . Follow-up    restless leg syndrome, prediabetes and neuropathy    HPI  Steven Hale. is here for follow up from his visit on 11/11/17.  At that time he was describing neuropathy. An A1c was checked since he was previously prediabetic as well as thyroid and electrolytes.  The a1c was advanced to the diabetic range. He is here for his newly diagnosed diabetic visit.  Lab Results  Component Value Date   HGBA1C 6.8 (H) 11/11/2017    Diabetes Mellitus Type II, Initial Visit: Patient here for an initial evaluation of Type 2 diabetes mellitus.  Current symptoms/problems include paresthesia of the feet and have been unchanged. Symptoms have been present for a few years.  The patient was initially diagnosed with Type 2 diabetes mellitus based on the following criteria:    Known diabetic complications: peripheral neuropathy Cardiovascular risk factors: family history of premature cardiovascular disease, hypertension and smoking/ tobacco exposure Current diabetic medications include oral agent (monotherapy): metformin (generic).   Eye exam current (within one year): yes Weight trend: stable Prior visit with dietician: no Current diet: in general, a "healthy" diet   Current exercise: walking and weightlifting  Current monitoring regimen: none Home blood sugar records: none Any episodes of hypoglycemia? no  Is He on ACE inhibitor or angiotensin II receptor blocker?  Yes  lisinopril (Prinivil)   Wt Readings from Last 3 Encounters:  11/25/17 218 lb (98.9 kg)  11/11/17 220 lb 9.6 oz (100.1 kg)  07/13/17 216 lb (98 kg)     Past Medical History:  Diagnosis Date  . Anemia, unspecified   . Bell's palsy   . Blood transfusion    during treatment for Ca  . Blood transfusion without reported diagnosis   . CAD (coronary artery disease)    a. s/p multiple PCIs;  b. s/p CABG in 09/2010 (LIMA-LAD, SVG-OM1, SVG-distal RCA/OM2);  Cardiac cath in  12/2015: Mild LAD disease, occluded mid LCX and proximal RCA (left to right collaterals), Occluded SVG to RCA and atretic LIMA. Patent SVG to OM  . Chronic back pain   . Diabetes mellitus without complication ([place])   . DJD (degenerative joint disease)    low back & all over   . GERD (gastroesophageal reflux disease)   . Helicobacter pylori (H. pylori) infection   . Hemorrhoids   . Hyperlipidemia   . Hypertension   . Impotence of organic origin   . Myocardial infarction ([place])    2005 and 2012   . PAD (peripheral artery disease) ([place])    a. s/p bilat SFA stents;  b. ABIs 11/2012: R 0.99, L 0.86.  [name] Pancreatic cancer [facility])    surgery 2009  . Pre-diabetes 08/16/2015    Current Outpatient Medications  Medication Sig Dispense Refill  . aspirin EC 81 MG tablet Take 1 tablet (81 mg total) by mouth daily.    [name] atorvastatin (LIPITOR) 40 MG tablet Take 1 tablet (40 mg total) by mouth daily. (Patient taking differently: Take 40 mg by mouth at bedtime. ) 90 tablet 3  . fish oil-omega-3 fatty acids 1000 MG capsule Take 1 capsule (1 g total) by mouth daily. 60 capsule 1  . gabapentin (NEURONTIN) 600 MG tablet Take 600 mg by mouth 3 (three) times daily.    [name] lisinopril (PRINIVIL,ZESTRIL) 5 MG tablet TAKE 0.5 TABLETS BY MOUTH  DAILY. 15 tablet 0  . metFORMIN (GLUCOPHAGE) 500 MG tablet TAKE 1 TABLET BY MOUTH 2 TIMES  DAILY WITH A MEAL. 180 tablet 3  . metoprolol tartrate (LOPRESSOR) 25 MG tablet Take 1 tablet by mouth two  times daily 60 tablet 0  . Multiple Vitamin (MULTIVITAMIN) tablet Take 1 tablet by mouth daily. 30 tablet 10  . nitroGLYCERIN (NITROSTAT) 0.4 MG SL tablet Place 1 tablet (0.4 mg total) under the tongue every 5 (five) minutes as needed for chest pain. 25 tablet 2  . Pancrelipase, Lip-Prot-Amyl, (ZENPEP) 25000 units CPEP Take 1 capsule by mouth 3 (three) times daily before meals.    . pantoprazole (PROTONIX) 40 MG tablet Take 1 tablet (40 mg total) by mouth daily. (Patient not taking:  Reported on 11/25/2017) 30 tablet 11  . tamsulosin (FLOMAX) 0.4 MG CAPS capsule Take 1 capsule (0.4 mg total) by mouth daily. (Patient not taking: Reported on 11/25/2017) 30 capsule 0   No current facility-administered medications for this visit.     Allergies: No Known Allergies  Past Surgical History:  Procedure Laterality Date  . ABDOMINAL AORTAGRAM N/A 09/13/2013   Procedure: ABDOMINAL Maxcine Ham;  Surgeon: Wellington Hampshire, MD;  Location: [place] CATH LAB;  Service: Cardiovascular;  Laterality: N/A;  . ABDOMINAL AORTAGRAM N/A 08/29/2014   Procedure: ABDOMINAL Maxcine Ham;  Surgeon: Wellington Hampshire, MD;  Location: [facility] CATH LAB;  Service: Cardiovascular;  Laterality: N/A;  . BACK SURGERY     1992  . CARDIAC CATHETERIZATION N/A 12/25/2015   Procedure: Left Heart Cath and Cors/Grafts Angiography;  Surgeon: Wellington Hampshire, MD;  Location: [place] CV LAB;  Service: Cardiovascular;  Laterality: N/A;  . CORONARY ARTERY BYPASS GRAFT  nov 2011   x 4  . ESOPHAGOGASTRODUODENOSCOPY (EGD) WITH PROPOFOL N/A 04/02/2017   Procedure: ESOPHAGOGASTRODUODENOSCOPY (EGD) WITH PROPOFOL;  Surgeon: Carol Ada, MD;  [facility];  Service: Endoscopy;  Laterality: N/A;  . HEMORRHOID SURGERY  10/30/2011   Procedure: HEMORRHOIDECTOMY;  Surgeon: Harl Bowie, MD;  Location: [place];  Service: General;  Laterality: N/A;  . JOINT REPLACEMENT    . pancreatic  cancer  05/10/2008   Resection of distal panrease and spleen  . PARTIAL KNEE ARTHROPLASTY Right 11/19/2014   Procedure: RIGHT UNI-KNEE ARTHROPLASTY MEDIALLY;  Surgeon: Mauri Pole, MD;  [facility];  Service: Orthopedics;  Laterality: Right;  . PERIPHERAL VASCULAR CATHETERIZATION  Oct 2014   Lt SFA PTA  . PERIPHERAL VASCULAR CATHETERIZATION  Oct 2015   ISR Lt SFA-PTA  . right partial knee replacement    . splenectomy    . THROMBECTOMY FEMORAL ARTERY Left 09/13/2013   Procedure: THROMBECTOMY FEMORAL ARTERY;  Surgeon: Wellington Hampshire, MD;   Location: [place] CATH LAB;  Service: Cardiovascular;  Laterality: Left;    Social History   Socioeconomic History  . Marital status: Single    Spouse name: None  . Number of children: 0  . Years of education: None  . Highest education level: None  Social Needs  . Financial resource strain: None  . Food insecurity - worry: None  . Food insecurity - inability: None  . Transportation needs - medical: None  . Transportation needs - non-medical: None  Occupational History  . Occupation: TEFL teacher: UNEMPLOYED  Tobacco Use  . Smoking status: Former Smoker    Types: Cigarettes    Last attempt to quit: 11/29/2008    Years since quitting: 8.9  . Smokeless tobacco: Never Used  Substance and Sexual Activity  . Alcohol use: Yes    Alcohol/week: 1.8 oz    Types: 3  Shots of liquor per week    Comment: 2 drinks of brandy every week   . Drug use: No  . Sexual activity: Not Currently  Other Topics Concern  . None  Social History Narrative   He works at the night shift at [employer] part time   [employer] [phone]   Single, no children    Family History  Problem Relation Age of Onset  . Heart attack Father        died of MI at age 70  . Cancer Father   . Anesthesia problems Neg Hx   . Hypotension Neg Hx   . Malignant hyperthermia Neg Hx   . Pseudochol deficiency Neg Hx   . Colon cancer Neg Hx   . Esophageal cancer Neg Hx   . Prostate cancer Neg Hx   . Rectal cancer Neg Hx   . Stomach cancer Neg Hx      ROS Review of Systems See HPI Constitution: No fevers or chills No malaise No diaphoresis Skin: No rash or itching Eyes: no blurry vision, no double vision GU: no dysuria or hematuria Neuro: no dizziness or headaches all others reviewed and negative   Objective: Vitals:   11/25/17 0758  BP: 120/70  Pulse: 62  Resp: 17  Temp: (!) 97.5 F (36.4 C)  TempSrc: Oral  SpO2: 98%  Weight: 218 lb (98.9 kg)  Height: 5\' 9"  (1.753 m)    Physical Exam    Constitutional: He is oriented to person, place, and time. He appears well-developed and well-nourished.  HENT:  Head: Normocephalic and atraumatic.  Eyes: Conjunctivae and EOM are normal.  Neck: Normal range of motion. No thyromegaly present.  Cardiovascular: Normal rate, regular rhythm and normal heart sounds.  No murmur heard. Pulmonary/Chest: Effort normal and breath sounds normal. No stridor. No respiratory distress. He has no wheezes.  Neurological: He is alert and oriented to person, place, and time.  Psychiatric: He has a normal mood and affect. His behavior is normal. Judgment and thought content normal.     Assessment and Plan Dakota was seen today for follow-up.  Diagnoses and all orders for this visit:  Diabetic polyneuropathy associated with type 2 diabetes mellitus ([place])- changed gabapentin to lyrica  PAD (peripheral artery disease) [facility])- reviewed cardiology notes and discussed that PAD can also contribute to his neuropathy  Newly diagnosed diabetes ([place])- will continue his current dose of metformin Discussed dietary changes and exercise  Well controlled type 2 diabetes mellitus with autonomic neuropathy ([place])- his goal is a1c<7% Discussed that his cardiovascular risk factors have increased with the new diagnosis of diabetes and that control is important  Other orders -     Cancel: Flu Vaccine QUAD 36+ mos IM -     Pneumococcal polysaccharide vaccine 23-valent greater than or equal to 2yo subcutaneous/IM -     Cancel: HM Diabetes Foot Exam -     Cancel: POCT glycosylated hemoglobin (Hb A1C)     Zoe A Stallings

## 2017-11-24 NOTE — Telephone Encounter (Signed)
Called to remind pt about their appt tomorrow 11/25/17

## 2017-11-25 ENCOUNTER — Encounter: Payer: Self-pay | Admitting: Family Medicine

## 2017-11-25 ENCOUNTER — Other Ambulatory Visit: Payer: Self-pay | Admitting: *Deleted

## 2017-11-25 ENCOUNTER — Ambulatory Visit (INDEPENDENT_AMBULATORY_CARE_PROVIDER_SITE_OTHER): Payer: Medicare Other | Admitting: Family Medicine

## 2017-11-25 ENCOUNTER — Other Ambulatory Visit: Payer: Self-pay

## 2017-11-25 VITALS — BP 120/70 | HR 62 | Temp 97.5°F | Resp 17 | Ht 69.0 in | Wt 218.0 lb

## 2017-11-25 DIAGNOSIS — I739 Peripheral vascular disease, unspecified: Secondary | ICD-10-CM

## 2017-11-25 DIAGNOSIS — E1142 Type 2 diabetes mellitus with diabetic polyneuropathy: Secondary | ICD-10-CM

## 2017-11-25 DIAGNOSIS — Z23 Encounter for immunization: Secondary | ICD-10-CM

## 2017-11-25 DIAGNOSIS — E1143 Type 2 diabetes mellitus with diabetic autonomic (poly)neuropathy: Secondary | ICD-10-CM | POA: Diagnosis not present

## 2017-11-25 DIAGNOSIS — E119 Type 2 diabetes mellitus without complications: Secondary | ICD-10-CM | POA: Diagnosis not present

## 2017-11-25 MED ORDER — PREGABALIN 150 MG PO CAPS: 150.0000 mg | ORAL_CAPSULE | Freq: Every day | ORAL | 3 refills | Status: DC

## 2017-11-25 MED FILL — LYRICA 150 MG CAPSULE: 150 | 30 days supply | Qty: 30 | Fill #0

## 2017-11-25 NOTE — Patient Instructions (Addendum)
1. Stop the gabapentin 2. Start lyrica at bedtime tomorrow 3. Wear shoes and keep feet covered at all times to protect yourself from injury 4. Follow up in on month    IF you received an x-ray today, you will receive an invoice from [facility]. Please contact [facility] at [phone] with questions or concerns regarding your invoice.   IF you received labwork today, you will receive an invoice from [facility]. Please contact [facility] at [phone] with questions or concerns regarding your invoice.   Our billing staff will not be able to assist you with questions regarding bills from these companies.  You will be contacted with the lab results as soon as they are available. The fastest way to get your results is to activate your My Chart account. Instructions are located on the last page of this paperwork. If you have not heard from [place] regarding the results in 2 weeks, please contact this office.     Diabetes and Foot Care Diabetes may cause you to have problems because of poor blood supply (circulation) to your feet and legs. This may cause the skin on your feet to become thinner, break easier, and heal more slowly. Your skin may become dry, and the skin may peel and crack. You may also have nerve damage in your legs and feet causing decreased feeling in them. You may not notice minor injuries to your feet that could lead to infections or more serious problems. Taking care of your feet is one of the most important things you can do for yourself. Follow these instructions at home:  Wear shoes at all times, even in the house. Do not go barefoot. Bare feet are easily injured.  Check your feet daily for blisters, cuts, and redness. If you cannot see the bottom of your feet, use a mirror or ask someone for help.  Wash your feet with warm water (do not use hot water) and mild soap. Then pat your feet and the areas between your toes until they are completely dry. Do not soak  your feet as this can dry your skin.  Apply a moisturizing lotion or petroleum jelly (that does not contain alcohol and is unscented) to the skin on your feet and to dry, brittle toenails. Do not apply lotion between your toes.  Trim your toenails straight across. Do not dig under them or around the cuticle. File the edges of your nails with an emery board or nail file.  Do not cut corns or calluses or try to remove them with medicine.  Wear clean socks or stockings every day. Make sure they are not too tight. Do not wear knee-high stockings since they may decrease blood flow to your legs.  Wear shoes that fit properly and have enough cushioning. To break in new shoes, wear them for just a few hours a day. This prevents you from injuring your feet. Always look in your shoes before you put them on to be sure there are no objects inside.  Do not cross your legs. This may decrease the blood flow to your feet.  If you find a minor scrape, cut, or break in the skin on your feet, keep it and the skin around it clean and dry. These areas may be cleansed with mild soap and water. Do not cleanse the area with peroxide, alcohol, or iodine.  When you remove an adhesive bandage, be sure not to damage the skin around it.  If you have a wound, look at  it several times a day to make sure it is healing.  Do not use heating pads or hot water bottles. They may burn your skin. If you have lost feeling in your feet or legs, you may not know it is happening until it is too late.  Make sure your health care provider performs a complete foot exam at least annually or more often if you have foot problems. Report any cuts, sores, or bruises to your health care provider immediately. Contact a health care provider if:  You have an injury that is not healing.  You have cuts or breaks in the skin.  You have an ingrown nail.  You notice redness on your legs or feet.  You feel burning or tingling in your legs or  feet.  You have pain or cramps in your legs and feet.  Your legs or feet are numb.  Your feet always feel cold. Get help right away if:  There is increasing redness, swelling, or pain in or around a wound.  There is a red line that goes up your leg.  Pus is coming from a wound.  You develop a fever or as directed by your health care provider.  You notice a bad smell coming from an ulcer or wound. This information is not intended to replace advice given to you by your health care provider. Make sure you discuss any questions you have with your health care provider. Document Released: 10/30/2000 Document Revised: 04/09/2016 Document Reviewed: 04/11/2013 Elsevier Interactive Patient Education  2017 [employer].

## 2017-12-08 ENCOUNTER — Telehealth: Payer: Self-pay | Admitting: Family Medicine

## 2017-12-08 DIAGNOSIS — E1142 Type 2 diabetes mellitus with diabetic polyneuropathy: Secondary | ICD-10-CM

## 2017-12-08 NOTE — Telephone Encounter (Signed)
Copied from [place]. Topic: Quick Communication - See Telephone Encounter >> Dec 08, 2017  8:10 AM Ether Griffins B wrote: CRM for notification. See Telephone encounter for:  Mickel Baas from [place] wanting progress notes from visit on 11/25/17 and the [facility] is wanting to take over the lyrica prescription. Fax [phone] atten [address] Duke Salvia and Mickel Baas. Phone number [phone] ext [phone] 12/08/17.

## 2017-12-09 NOTE — Telephone Encounter (Signed)
I faxed the office visit notes now they need the RX sent to them [phone] Attn Dr Duke Salvia

## 2017-12-10 ENCOUNTER — Other Ambulatory Visit: Payer: Self-pay | Admitting: Physician Assistant

## 2017-12-10 NOTE — Telephone Encounter (Signed)
Hey this was sent to [place]. It was reported on 11/25/2017 that he is not taking. I reviewed that note from that date and don't see anything regarding the Flomax. I wanted to be sure before I declined this med.

## 2017-12-15 ENCOUNTER — Other Ambulatory Visit: Payer: Self-pay

## 2017-12-15 ENCOUNTER — Ambulatory Visit (INDEPENDENT_AMBULATORY_CARE_PROVIDER_SITE_OTHER): Payer: Medicare Other | Admitting: Family Medicine

## 2017-12-15 ENCOUNTER — Encounter: Payer: Self-pay | Admitting: Family Medicine

## 2017-12-15 VITALS — BP 122/78 | HR 83 | Temp 98.0°F | Resp 16 | Ht 70.47 in | Wt 220.0 lb

## 2017-12-15 DIAGNOSIS — E1143 Type 2 diabetes mellitus with diabetic autonomic (poly)neuropathy: Secondary | ICD-10-CM

## 2017-12-15 DIAGNOSIS — Z8507 Personal history of malignant neoplasm of pancreas: Secondary | ICD-10-CM | POA: Diagnosis not present

## 2017-12-15 DIAGNOSIS — E1142 Type 2 diabetes mellitus with diabetic polyneuropathy: Secondary | ICD-10-CM | POA: Diagnosis not present

## 2017-12-15 DIAGNOSIS — R399 Unspecified symptoms and signs involving the genitourinary system: Secondary | ICD-10-CM

## 2017-12-15 DIAGNOSIS — G629 Polyneuropathy, unspecified: Secondary | ICD-10-CM

## 2017-12-15 MED ORDER — METFORMIN HCL 500 MG PO TABS: 500.0000 mg | ORAL_TABLET | Freq: Every day | ORAL | 3 refills | Status: DC

## 2017-12-15 MED ORDER — TAMSULOSIN HCL 0.4 MG PO CAPS: 0.4000 mg | ORAL_CAPSULE | Freq: Every day | ORAL | 6 refills | Status: DC

## 2017-12-15 MED FILL — TAMSULOSIN HCL 0.4 MG CAP: 0.4 | 30 days supply | Qty: 30 | Fill #0

## 2017-12-15 MED FILL — metFORMIN HCL 500 MG TABS: 500 | 90 days supply | Qty: 90 | Fill #0

## 2017-12-15 NOTE — Patient Instructions (Addendum)
Since you having low sugar symptoms then cut back your metformin to once a day    IF you received an x-ray today, you will receive an invoice from [facility]. Please contact [facility] at [phone] with questions or concerns regarding your invoice.   IF you received labwork today, you will receive an invoice from [place]. Please contact [facility] at [phone] with questions or concerns regarding your invoice.   Our billing staff will not be able to assist you with questions regarding bills from these companies.  You will be contacted with the lab results as soon as they are available. The fastest way to get your results is to activate your My Chart account. Instructions are located on the last page of this paperwork. If you have not heard from [place] regarding the results in 2 weeks, please contact this office.      Hypoglycemia Hypoglycemia occurs when the level of sugar (glucose) in the blood is too low. Glucose is a type of sugar that provides the body's main source of energy. Certain hormones (insulin and glucagon) control the level of glucose in the blood. Insulin lowers blood glucose, and glucagon increases blood glucose. Hypoglycemia can result from having too much insulin in the bloodstream, or from not eating enough food that contains glucose. Hypoglycemia can happen in people who do or do not have diabetes. It can develop quickly, and it can be a medical emergency. What are the causes? Hypoglycemia occurs most often in people who have diabetes. If you have diabetes, hypoglycemia may be caused by:  Diabetes medicine.  Not eating enough, or not eating often enough.  Increased physical activity.  Drinking alcohol, especially when you have not eaten recently.  If you do not have diabetes, hypoglycemia may be caused by:  A tumor in the pancreas. The pancreas is the organ that makes insulin.  Not eating enough, or not eating for long periods at a time  (fasting).  Severe infection or illness that affects the liver, heart, or kidneys.  Certain medicines.  You may also have reactive hypoglycemia. This condition causes hypoglycemia within 4 hours of eating a meal. This may occur after having stomach surgery. Sometimes, the cause of reactive hypoglycemia is not known. What increases the risk? Hypoglycemia is more likely to develop in:  People who have diabetes and take medicines to lower blood glucose.  People who abuse alcohol.  People who have a severe illness.  What are the signs or symptoms? Hypoglycemia may not cause any symptoms. If you have symptoms, they may include:  Hunger.  Anxiety.  Sweating and feeling clammy.  Confusion.  Dizziness or feeling light-headed.  Sleepiness.  Nausea.  Increased heart rate.  Headache.  Blurry vision.  Seizure.  Nightmares.  Tingling or numbness around the mouth, lips, or tongue.  A change in speech.  Decreased ability to concentrate.  A change in coordination.  Restless sleep.  Tremors or shakes.  Fainting.  Irritability.  How is this diagnosed? Hypoglycemia is diagnosed with a blood test to measure your blood glucose level. This blood test is done while you are having symptoms. Your health care provider may also do a physical exam and review your medical history. If you do not have diabetes, other tests may be done to find the cause of your hypoglycemia. How is this treated? This condition can often be treated by immediately eating or drinking something that contains glucose, such as:  3-4 sugar tablets (glucose pills).  Glucose gel, 15-gram tube.  Fruit juice, 4 oz (120 mL).  Regular soda (not diet soda), 4 oz (120 mL).  Low-fat milk, 4 oz (120 mL).  Several pieces of hard candy.  Sugar or honey, 1 Tbsp.  Treating Hypoglycemia If You Have Diabetes  If you are alert and able to swallow safely, follow the 15:15 rule:  Take 15 grams of a  rapid-acting carbohydrate. Rapid-acting options include: ? 1 tube of glucose gel. ? 3 glucose pills. ? 6-8 pieces of hard candy. ? 4 oz (120 mL) of fruit juice. ? 4 oz (120 ml) of regular (not diet) soda.  Check your blood glucose 15 minutes after you take the carbohydrate.  If the repeat blood glucose level is still at or below 70 mg/dL (3.9 mmol/L), take 15 grams of a carbohydrate again.  If your blood glucose level does not increase above 70 mg/dL (3.9 mmol/L) after 3 tries, seek emergency medical care.  After your blood glucose level returns to normal, eat a meal or a snack within 1 hour.  Treating Severe Hypoglycemia Severe hypoglycemia is when your blood glucose level is at or below 54 mg/dL (3 mmol/L). Severe hypoglycemia is an emergency. Do not wait to see if the symptoms will go away. Get medical help right away. Call your local emergency services (911 in the [place] Do not drive yourself to the hospital. If you have severe hypoglycemia and you cannot eat or drink, you may need an injection of glucagon. A family member or close friend should learn how to check your blood glucose and how to give you a glucagon injection. Ask your health care provider if you need to have an emergency glucagon injection kit available. Severe hypoglycemia may need to be treated in a hospital. The treatment may include getting glucose through an IV tube. You may also need treatment for the cause of your hypoglycemia. Follow these instructions at home: General instructions  Avoid any diets that cause you to not eat enough food. Talk with your health care provider before you start any new diet.  Take over-the-counter and prescription medicines only as told by your health care provider.  Limit alcohol intake to no more than 1 drink per day for nonpregnant women and 2 drinks per day for men. One drink equals 12 oz of beer, 5 oz of wine, or 1 oz of hard liquor.  Keep all follow-up visits as told by your  health care provider. This is important. If You Have Diabetes:   Make sure you know the symptoms of hypoglycemia.  Always have a rapid-acting carbohydrate snack with you to treat low blood sugar.  Follow your diabetes management plan, as told by your health care provider. Make sure you: ? Take your medicines as directed. ? Follow your exercise plan. ? Follow your meal plan. Eat on time, and do not skip meals. ? Check your blood glucose as often as directed. Make sure to check your blood glucose before and after exercise. If you exercise longer or in a different way than usual, check your blood glucose more often. ? Follow your sick day plan whenever you cannot eat or drink normally. Make this plan in advance with your health care provider.  Share your diabetes management plan with people in your workplace, school, and household.  Check your urine for ketones when you are ill and as told by your health care provider.  Carry a medical alert card or wear medical alert jewelry. If You Have Reactive Hypoglycemia or Low Blood Sugar  From Other Causes:  Monitor your blood glucose as told by your health care provider.  Follow instructions from your health care provider about eating or drinking restrictions. Contact a health care provider if:  You have problems keeping your blood glucose in your target range.  You have frequent episodes of hypoglycemia. Get help right away if:  You continue to have hypoglycemia symptoms after eating or drinking something containing glucose.  Your blood glucose is at or below 54 mg/dL (3 mmol/L).  You have a seizure.  You faint. These symptoms may represent a serious problem that is an emergency. Do not wait to see if the symptoms will go away. Get medical help right away. Call your local emergency services (911 in the [place] Do not drive yourself to the hospital. This information is not intended to replace advice given to you by your health care  provider. Make sure you discuss any questions you have with your health care provider. Document Released: 11/02/2005 Document Revised: 04/15/2016 Document Reviewed: 12/06/2015 Elsevier Interactive Patient Education  [employer].

## 2017-12-15 NOTE — Telephone Encounter (Signed)
Please advise 

## 2017-12-15 NOTE — Progress Notes (Signed)
Chief Complaint  Patient presents with  . Medication Refill    tamsulosin     HPI  Neuropathy Pt reports that the lyrica is helping him He does not have any further tingling and on the lyrica but it is much better He denies any hot flashes or side effects of medications He has a history of stroke and diabetes  He also had a previous history of alcohol abuse  Diabetes He reports that he gets sweats and fatigue  He has good sugars but typically  Has to eat soemthing when he gets those symtpoms Lab Results  Component Value Date   HGBA1C 6.8 (H) 11/11/2017  He denies skin lesion or foot ulcers He denies polyuria, polydipsia or polyphagia   Medication refill He reports that he needs tamsulosin refill This helps him with his urinary symptoms He denies lightheadedness or dizziness He denies fatigue He denies dysuria, leakage of urine    Past Medical History:  Diagnosis Date  . Anemia, unspecified   . Bell's palsy   . Blood transfusion    during treatment for Ca  . Blood transfusion without reported diagnosis   . CAD (coronary artery disease)    a. s/p multiple PCIs;  b. s/p CABG in 09/2010 (LIMA-LAD, SVG-OM1, SVG-distal RCA/OM2);  Cardiac cath in 12/2015: Mild LAD disease, occluded mid LCX and proximal RCA (left to right collaterals), Occluded SVG to RCA and atretic LIMA. Patent SVG to OM  . Chronic back pain   . Diabetes mellitus without complication ([place])   . DJD (degenerative joint disease)    low back & all over   . GERD (gastroesophageal reflux disease)   . Helicobacter pylori (H. pylori) infection   . Hemorrhoids   . Hyperlipidemia   . Hypertension   . Impotence of organic origin   . Myocardial infarction ([place])    2005 and 2012   . PAD (peripheral artery disease) ([place])    a. s/p bilat SFA stents;  b. ABIs 11/2012: R 0.99, L 0.86.  [name] Pancreatic cancer [facility])    surgery 2009  . Pre-diabetes 08/16/2015    Current Outpatient Medications  Medication Sig Dispense  Refill  . aspirin EC 81 MG tablet Take 1 tablet (81 mg total) by mouth daily.    [name] atorvastatin (LIPITOR) 40 MG tablet Take 1 tablet (40 mg total) by mouth daily. (Patient taking differently: Take 40 mg by mouth at bedtime. ) 90 tablet 3  . fish oil-omega-3 fatty acids 1000 MG capsule Take 1 capsule (1 g total) by mouth daily. 60 capsule 1  . lisinopril (PRINIVIL,ZESTRIL) 5 MG tablet TAKE 0.5 TABLETS BY MOUTH  DAILY. 15 tablet 0  . metFORMIN (GLUCOPHAGE) 500 MG tablet Take 1 tablet (500 mg total) by mouth daily with breakfast. 90 tablet 3  . metoprolol tartrate (LOPRESSOR) 25 MG tablet Take 1 tablet by mouth two  times daily 60 tablet 0  . Multiple Vitamin (MULTIVITAMIN) tablet Take 1 tablet by mouth daily. 30 tablet 10  . pantoprazole (PROTONIX) 40 MG tablet Take 1 tablet (40 mg total) by mouth daily. 30 tablet 11  . tamsulosin (FLOMAX) 0.4 MG CAPS capsule Take 1 capsule (0.4 mg total) by mouth daily. 30 capsule 6  . nitroGLYCERIN (NITROSTAT) 0.4 MG SL tablet Place 1 tablet (0.4 mg total) under the tongue every 5 (five) minutes as needed for chest pain. 25 tablet 2  . [START ON 12/26/2017] pregabalin (LYRICA) 150 MG capsule Take 1 capsule (150 mg total) by mouth  daily. 30 capsule 6   No current facility-administered medications for this visit.     Allergies: No Known Allergies  Past Surgical History:  Procedure Laterality Date  . ABDOMINAL AORTAGRAM N/A 09/13/2013   Procedure: ABDOMINAL Maxcine Ham;  Surgeon: Wellington Hampshire, MD;  Location: [place] CATH LAB;  Service: Cardiovascular;  Laterality: N/A;  . ABDOMINAL AORTAGRAM N/A 08/29/2014   Procedure: ABDOMINAL Maxcine Ham;  Surgeon: Wellington Hampshire, MD;  Location: [place] CATH LAB;  Service: Cardiovascular;  Laterality: N/A;  . BACK SURGERY     1992  . CARDIAC CATHETERIZATION N/A 12/25/2015   Procedure: Left Heart Cath and Cors/Grafts Angiography;  Surgeon: Wellington Hampshire, MD;  Location: [place] CV LAB;  Service: Cardiovascular;  Laterality: N/A;   . CORONARY ARTERY BYPASS GRAFT  nov 2011   x 4  . ESOPHAGOGASTRODUODENOSCOPY (EGD) WITH PROPOFOL N/A 04/02/2017   Procedure: ESOPHAGOGASTRODUODENOSCOPY (EGD) WITH PROPOFOL;  Surgeon: Carol Ada, MD;  Location: [facility] ENDOSCOPY;  Service: Endoscopy;  Laterality: N/A;  . HEMORRHOID SURGERY  10/30/2011   Procedure: HEMORRHOIDECTOMY;  Surgeon: Harl Bowie, MD;  Location: [place];  Service: General;  Laterality: N/A;  . JOINT REPLACEMENT    . pancreatic  cancer  05/10/2008   Resection of distal panrease and spleen  . PARTIAL KNEE ARTHROPLASTY Right 11/19/2014   Procedure: RIGHT UNI-KNEE ARTHROPLASTY MEDIALLY;  Surgeon: Mauri Pole, MD;  Location: [facility] ORS;  Service: Orthopedics;  Laterality: Right;  . PERIPHERAL VASCULAR CATHETERIZATION  Oct 2014   Lt SFA PTA  . PERIPHERAL VASCULAR CATHETERIZATION  Oct 2015   ISR Lt SFA-PTA  . right partial knee replacement    . splenectomy    . THROMBECTOMY FEMORAL ARTERY Left 09/13/2013   Procedure: THROMBECTOMY FEMORAL ARTERY;  Surgeon: Wellington Hampshire, MD;  Location: [place] CATH LAB;  Service: Cardiovascular;  Laterality: Left;    Social History   Socioeconomic History  . Marital status: Single    Spouse name: None  . Number of children: 0  . Years of education: None  . Highest education level: None  Social Needs  . Financial resource strain: None  . Food insecurity - worry: None  . Food insecurity - inability: None  . Transportation needs - medical: None  . Transportation needs - non-medical: None  Occupational History  . Occupation: TEFL teacher: UNEMPLOYED  Tobacco Use  . Smoking status: Former Smoker    Types: Cigarettes    Last attempt to quit: 11/29/2008    Years since quitting: 9.0  . Smokeless tobacco: Never Used  Substance and Sexual Activity  . Alcohol use: Yes    Alcohol/week: 1.8 oz    Types: 3 Shots of liquor per week    Comment: 2 drinks of brandy every week   . Drug use: No  . Sexual activity: Not Currently    Other Topics Concern  . 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 Heart attack Father        died of MI at age 68  . Cancer Father   . Anesthesia problems Neg Hx   . Hypotension Neg Hx   . Malignant hyperthermia Neg Hx   . Pseudochol deficiency Neg Hx   . Colon cancer Neg Hx   . Esophageal cancer Neg Hx   .  Prostate cancer Neg Hx   . Rectal cancer Neg Hx   . Stomach cancer Neg Hx      ROS Review of Systems See HPI [place]: No fevers or chills No malaise No diaphoresis Skin: No rash or itching Eyes: no blurry vision, no double vision GU: no dysuria or hematuria Neuro: no dizziness or headaches all others reviewed and negative   Objective: Vitals:   12/15/17 1121  BP: 122/78  Pulse: 83  Resp: 16  Temp: 98 F (36.7 C)  TempSrc: Oral  SpO2: 97%  Weight: 220 lb (99.8 kg)  Height: 5' 10.47" (1.79 m)    Physical Exam  Constitutional: He is oriented to person, place, and time. He appears well-developed and well-nourished.  HENT:  Head: Normocephalic and atraumatic.  Eyes: Conjunctivae and EOM are normal.  Cardiovascular: Normal rate, regular rhythm and normal heart sounds.  Pulmonary/Chest: Effort normal and breath sounds normal. No stridor. No respiratory distress.  Neurological: He is alert and oriented to person, place, and time.    Assessment and Plan [place] was seen today for medication refill.  Diagnoses and all orders for this visit:  Diabetic polyneuropathy associated with type 2 diabetes mellitus ([place]) - diabetes at goal  -     metFORMIN (GLUCOPHAGE) 500 MG tablet; Take 1 tablet (500 mg total) by mouth daily with breakfast.  Neuropathy- improved, lyrica printed and sent to [place] to manage  Well controlled type 2 diabetes mellitus with autonomic neuropathy ([place])- stable a1c, improved neuropathy  History  of pancreatic cancer- reviewed previous records with patient  Lower urinary tract symptoms (LUTS)- refilled tamsulosin  Other orders -     Cancel: POCT glucose (manual entry) -     tamsulosin (FLOMAX) 0.4 MG CAPS capsule; Take 1 capsule (0.4 mg total) by mouth daily.     [place]

## 2017-12-16 MED ORDER — PREGABALIN 150 MG PO CAPS: 150.0000 mg | ORAL_CAPSULE | Freq: Every day | ORAL | 6 refills | Status: DC

## 2017-12-16 NOTE — Telephone Encounter (Signed)
Spoke with [employer]  She is faxing script to [phone] Attn Dr Duke Salvia

## 2017-12-16 NOTE — Telephone Encounter (Signed)
Printed script. Please fax. Thank you.

## 2017-12-22 MED FILL — LINZESS 145 MCG CAPSULE: 145 | 30 days supply | Qty: 30 | Fill #0

## 2017-12-22 MED FILL — PANTOPRAZOLE SOD DR 40 MG T: 40 | 30 days supply | Qty: 30 | Fill #2

## 2017-12-23 MED FILL — LYRICA 150 MG CAPSULE: 150 | 30 days supply | Qty: 30 | Fill #1

## 2017-12-28 NOTE — Telephone Encounter (Signed)
flomax ordered on 12/15/17 with 6 refills. Dgaddy, CMA

## 2018-01-11 MED FILL — TAMSULOSIN HCL 0.4 MG CAP: 0.4 | 30 days supply | Qty: 30 | Fill #1

## 2018-01-13 MED FILL — AMOXICILLIN 500 MG CAPSULE: 500 | 4 days supply | Qty: 16 | Fill #0

## 2018-01-20 MED FILL — LINZESS 145 MCG CAPSULE: 145 | 30 days supply | Qty: 30 | Fill #1

## 2018-01-20 MED FILL — PANTOPRAZOLE SOD DR 40 MG T: 40 | 30 days supply | Qty: 30 | Fill #3

## 2018-01-21 MED FILL — LYRICA 150 MG CAPSULE: 150 | 30 days supply | Qty: 30 | Fill #2

## 2018-02-01 ENCOUNTER — Ambulatory Visit: Payer: Medicare Other | Admitting: Cardiovascular Disease

## 2018-02-08 MED FILL — TAMSULOSIN HCL 0.4 MG CAP: 0.4 | 30 days supply | Qty: 30 | Fill #2

## 2018-02-15 ENCOUNTER — Encounter: Payer: Self-pay | Admitting: Cardiovascular Disease

## 2018-02-15 ENCOUNTER — Ambulatory Visit (INDEPENDENT_AMBULATORY_CARE_PROVIDER_SITE_OTHER): Payer: Medicare Other | Admitting: Cardiovascular Disease

## 2018-02-15 VITALS — BP 122/72 | HR 53 | Ht 69.0 in | Wt 220.0 lb

## 2018-02-15 DIAGNOSIS — E7849 Other hyperlipidemia: Secondary | ICD-10-CM

## 2018-02-15 DIAGNOSIS — I10 Essential (primary) hypertension: Secondary | ICD-10-CM | POA: Diagnosis not present

## 2018-02-15 DIAGNOSIS — I25118 Atherosclerotic heart disease of native coronary artery with other forms of angina pectoris: Secondary | ICD-10-CM

## 2018-02-15 DIAGNOSIS — I739 Peripheral vascular disease, unspecified: Secondary | ICD-10-CM | POA: Diagnosis not present

## 2018-02-15 NOTE — Progress Notes (Signed)
Cardiology Office Note   Date:  02/16/2018   ID:  [name], DOB Dec 03, 1955, MRN [id number]  PCP:  [name], MD  Cardiologist:  Dr. Lionel December chief complaint on file.     History of Present Illness: Steven Hale. is a 62 y.o. male who presents for a followup visit regarding peripheral arterial disease and coronary artery disease. He has known history of coronary artery disease with multiple interventions in the past. He had CABG in 2011.  He quit smoking in 2009.  He has known history of peripheral arterial disease with intervention on both SFAs. Cardiac catheterization in January, 2017 showed mild nonobstructive LAD disease, occluded mid left circumflex and occluded proximal right coronary artery with left-to-right collaterals. SVG to RCA was occluded and LIMA to LAD was atretic. SVG to OM was normal. Abnormal stress test was due to chronically occluded right coronary artery and graft with left-to-right collaterals. His native RCA was not favorable for CTO PCI. I recommended medical therapy. Imdur was added.   Most recent vascular studies in May 2018 showed normal ABI and patent SFAs bilaterally.  The patient is known to have severe diabetic peripheral neuropathy.  He is now on Lyrica and reports significant improvement in symptoms.  He tries to exercise on a treadmill for 15-20 minutes but is not consistent.  No chest pain or shortness of breath.  Past Medical History:  Diagnosis Date  . Anemia, unspecified   . Bell's palsy   . Blood transfusion    during treatment for Ca  . Blood transfusion without reported diagnosis   . CAD (coronary artery disease)    a. s/p multiple PCIs;  b. s/p CABG in 09/2010 (LIMA-LAD, SVG-OM1, SVG-distal RCA/OM2);  Cardiac cath in 12/2015: Mild LAD disease, occluded mid LCX and proximal RCA (left to right collaterals), Occluded SVG to RCA and atretic LIMA. Patent SVG to OM  . Chronic back pain   . Diabetes mellitus without  complication ([place])   . DJD (degenerative joint disease)    low back & all over   . GERD (gastroesophageal reflux disease)   . Helicobacter pylori (H. pylori) infection   . Hemorrhoids   . Hyperlipidemia   . Hypertension   . Impotence of organic origin   . Myocardial infarction ([place])    2005 and 2012   . PAD (peripheral artery disease) ([place])    a. s/p bilat SFA stents;  b. ABIs 11/2012: R 0.99, L 0.86.  [name] Pancreatic cancer [facility])    surgery 2009  . Pre-diabetes 08/16/2015    Past Surgical History:  Procedure Laterality Date  . ABDOMINAL AORTAGRAM N/A 09/13/2013   Procedure: ABDOMINAL Maxcine Ham;  Surgeon: Wellington Hampshire, MD;  Location: [place] CATH LAB;  Service: Cardiovascular;  Laterality: N/A;  . ABDOMINAL AORTAGRAM N/A 08/29/2014   Procedure: ABDOMINAL Maxcine Ham;  Surgeon: Wellington Hampshire, MD;  Location: [place] CATH LAB;  Service: Cardiovascular;  Laterality: N/A;  . BACK SURGERY     1992  . CARDIAC CATHETERIZATION N/A 12/25/2015   Procedure: Left Heart Cath and Cors/Grafts Angiography;  Surgeon: Wellington Hampshire, MD;  Location: [place] CV LAB;  Service: Cardiovascular;  Laterality: N/A;  . CORONARY ARTERY BYPASS GRAFT  nov 2011   x 4  . ESOPHAGOGASTRODUODENOSCOPY (EGD) WITH PROPOFOL N/A 04/02/2017   Procedure: ESOPHAGOGASTRODUODENOSCOPY (EGD) WITH PROPOFOL;  Surgeon: Carol Ada, MD;  [facility];  Service: Endoscopy;  Laterality: N/A;  . HEMORRHOID SURGERY  10/30/2011  Procedure: HEMORRHOIDECTOMY;  Surgeon: Harl Bowie, MD;  Location: [place];  Service: General;  Laterality: N/A;  . JOINT REPLACEMENT    . pancreatic  cancer  05/10/2008   Resection of distal panrease and spleen  . PARTIAL KNEE ARTHROPLASTY Right 11/19/2014   Procedure: RIGHT UNI-KNEE ARTHROPLASTY MEDIALLY;  Surgeon: Mauri Pole, MD;  [facility];  Service: Orthopedics;  Laterality: Right;  . PERIPHERAL VASCULAR CATHETERIZATION  Oct 2014   Lt SFA PTA  . PERIPHERAL VASCULAR CATHETERIZATION   Oct 2015   ISR Lt SFA-PTA  . right partial knee replacement    . splenectomy    . THROMBECTOMY FEMORAL ARTERY Left 09/13/2013   Procedure: THROMBECTOMY FEMORAL ARTERY;  Surgeon: Wellington Hampshire, MD;  Location: [place] CATH LAB;  Service: Cardiovascular;  Laterality: Left;     Current Outpatient Medications  Medication Sig Dispense Refill  . aspirin EC 81 MG tablet Take 1 tablet (81 mg total) by mouth daily.    [name] atorvastatin (LIPITOR) 40 MG tablet Take 1 tablet (40 mg total) by mouth daily. (Patient taking differently: Take 40 mg by mouth at bedtime. ) 90 tablet 3  . fish oil-omega-3 fatty acids 1000 MG capsule Take 1 capsule (1 g total) by mouth daily. 60 capsule 1  . lisinopril (PRINIVIL,ZESTRIL) 5 MG tablet TAKE 0.5 TABLETS BY MOUTH  DAILY. 15 tablet 0  . metFORMIN (GLUCOPHAGE) 500 MG tablet Take 1 tablet (500 mg total) by mouth daily with breakfast. 90 tablet 3  . metoprolol tartrate (LOPRESSOR) 25 MG tablet Take 1 tablet by mouth two  times daily 60 tablet 0  . Multiple Vitamin (MULTIVITAMIN) tablet Take 1 tablet by mouth daily. 30 tablet 10  . pantoprazole (PROTONIX) 40 MG tablet Take 1 tablet (40 mg total) by mouth daily. 30 tablet 11  . pregabalin (LYRICA) 150 MG capsule Take 1 capsule (150 mg total) by mouth daily. 30 capsule 6  . tamsulosin (FLOMAX) 0.4 MG CAPS capsule Take 1 capsule (0.4 mg total) by mouth daily. 30 capsule 6  . nitroGLYCERIN (NITROSTAT) 0.4 MG SL tablet Place 1 tablet (0.4 mg total) under the tongue every 5 (five) minutes as needed for chest pain. 25 tablet 2   No current facility-administered medications for this visit.     Allergies:   Patient has no known allergies.    Social History:  The patient  reports that he quit smoking about 9 years ago. His smoking use included cigarettes. He has never used smokeless tobacco. He reports that he drinks about 1.8 oz of alcohol per week. He reports that he does not use drugs.   Family History:  The patient's family  history includes Cancer in his father; Heart attack in his father.    ROS:  Please see the history of present illness.   Otherwise, review of systems are positive for none.   All other systems are reviewed and negative.    PHYSICAL EXAM: VS:  BP 122/72   Pulse (!) 53   Ht 5\' 9"  (1.753 m)   Wt 220 lb (99.8 kg)   BMI 32.49 kg/m  , BMI Body mass index is 32.49 kg/m. GEN: Well nourished, well developed, in no acute distress  HEENT: normal  Neck: no JVD, carotid bruits, or masses Cardiac: RRR; no murmurs, rubs, or gallops,no edema  Respiratory:  clear to auscultation bilaterally, normal work of breathing GI: soft, nontender, nondistended, + BS MS: no deformity or atrophy  Skin: warm and dry, no rash  Neuro:  Strength and sensation are intact Psych: euthymic mood, full affect Distal pulses are not palpable.  EKG:  EKG is ordered today. EKG showed sinus bradycardia with a heart rate of 53 bpm   Recent Labs: 11/11/2017: ALT 28; BUN 11; Creatinine, Ser 0.73; Hemoglobin 12.6; Platelets 299; Potassium 3.9; Sodium 146; TSH 1.420    Lipid Panel    Component Value Date/Time   CHOL 145 04/08/2017 1126   TRIG 131 04/08/2017 1126   HDL 44 04/08/2017 1126   CHOLHDL 3.3 04/08/2017 1126   CHOLHDL 3.2 08/17/2016 0925   VLDL 28 08/17/2016 0925   LDLCALC 75 04/08/2017 1126   LDLDIRECT 80 11/06/2013 0925      Wt Readings from Last 3 Encounters:  02/15/18 220 lb (99.8 kg)  12/15/17 220 lb (99.8 kg)  11/25/17 218 lb (98.9 kg)        ASSESSMENT AND PLAN:   1. Peripheral arterial disease: Previous bilateral SFA intervention.  No recurrent claudication.  Repeat ABI and lower extremity duplex in May of this year.   2. Peripheral diabetic neuropathy: Significant improvement in symptoms on Lyrica.  3. Coronary artery disease involving native coronary arteries with stable angina:  Symptoms are well-controlled with medications.  4. Essential hypertension: Blood pressure is controlled  on current medications.  5. Hyperlipidemia:  Continue treatment with atorvastatin 40 mg once daily. Most recent LDL was 75 which is close to target.  I discussed with him the importance of increasing his exercise regimen and try to get his weight down to 200 pounds.   Disposition:   FU with me in 6 months  Signed,  Kathlyn Sacramento, MD  02/16/2018 8:59 AM    [place]

## 2018-02-15 NOTE — Patient Instructions (Signed)
Medication Instructions: Your physician recommends that you continue on your current medications as directed. Please refer to the Current Medication list given to you today.  If you need a refill on your cardiac medications before your next appointment, please call your pharmacy.   Follow-Up: Your physician wants you to follow-up in 6 months with Dr. Fletcher Anon. You will receive a reminder letter in the mail two months in advance. If you don't receive a letter, please call our office at [phone] to schedule this follow-up appointment.   Thank you for choosing [facility] at [facility]!!

## 2018-02-23 ENCOUNTER — Encounter: Payer: Self-pay | Admitting: Physician Assistant

## 2018-03-10 MED FILL — TAMSULOSIN HCL 0.4 MG CAP: 0.4 | 30 days supply | Qty: 30 | Fill #3

## 2018-04-14 MED FILL — TAMSULOSIN HCL 0.4 MG CAP: 0.4 | 30 days supply | Qty: 30 | Fill #4

## 2018-05-02 ENCOUNTER — Other Ambulatory Visit: Payer: Self-pay | Admitting: Family Medicine

## 2018-05-02 DIAGNOSIS — E1142 Type 2 diabetes mellitus with diabetic polyneuropathy: Secondary | ICD-10-CM

## 2018-05-02 MED ORDER — PREGABALIN 150 MG PO CAPS: 150.0000 mg | ORAL_CAPSULE | Freq: Every day | ORAL | 0 refills | Status: DC

## 2018-05-02 MED FILL — LYRICA 150 MG CAPSULE: 150 | 30 days supply | Qty: 30 | Fill #0

## 2018-05-02 NOTE — Progress Notes (Signed)
Gave one month supply. Pt has follow up in the office in 2 days.

## 2018-05-04 ENCOUNTER — Encounter: Payer: Self-pay | Admitting: Family Medicine

## 2018-05-04 ENCOUNTER — Other Ambulatory Visit: Payer: Self-pay

## 2018-05-04 ENCOUNTER — Ambulatory Visit (INDEPENDENT_AMBULATORY_CARE_PROVIDER_SITE_OTHER): Payer: Medicare Other | Admitting: Family Medicine

## 2018-05-04 VITALS — BP 109/73 | HR 63 | Temp 99.0°F | Resp 17 | Ht 69.0 in | Wt 216.4 lb

## 2018-05-04 DIAGNOSIS — I25119 Atherosclerotic heart disease of native coronary artery with unspecified angina pectoris: Secondary | ICD-10-CM | POA: Diagnosis not present

## 2018-05-04 DIAGNOSIS — E118 Type 2 diabetes mellitus with unspecified complications: Secondary | ICD-10-CM

## 2018-05-04 DIAGNOSIS — E782 Mixed hyperlipidemia: Secondary | ICD-10-CM

## 2018-05-04 LAB — POCT GLYCOSYLATED HEMOGLOBIN (HGB A1C): Hemoglobin A1C: 6.8 % — AB (ref 4.0–5.6)

## 2018-05-04 NOTE — Progress Notes (Signed)
Chief Complaint  Patient presents with  . Diabetes    f/u pt was suppose to come back around 03/15/18 for diabetes f/u    HPI  Diabetes Mellitus: Patient presents for follow up of diabetes. Symptoms: none.  Patient denies foot ulcerations, hyperglycemia, hypoglycemia , increase appetite, nausea, paresthesia of the feet, polydipsia, polyuria, visual disturbances and vomitting.  Evaluation to date has been included: hemoglobin A1C.  Home sugars: patient does not check sugars. Treatment to date: Continued metformin which has been effective.   Lab Results  Component Value Date   HGBA1C 6.8 (A) 05/04/2018    Dyslipidemia: Patient presents for evaluation of lipids.  Compliance with treatment thus far has been good.  A repeat fasting lipid profile was done.  The patient does not use medications that may worsen dyslipidemias (corticosteroids, progestins, anabolic steroids, diuretics, beta-blockers, amiodarone, cyclosporine, olanzapine). The patient exercises intermittently.  The patient is known to have coexisting coronary artery disease.   CAD: He is exercising and is adherent to low salt diet.  Blood pressure is well controlled at home. Cardiac symptoms none. Patient denies chest pain, chest pressure/discomfort, dyspnea, fatigue, irregular heart beat, lower extremity edema, near-syncope and palpitations.  Cardiovascular risk factors: diabetes mellitus, dyslipidemia, and hypertension. Use of agents associated with hypertension: none. History of target organ damage: prior coronary revascularization. BP Readings from Last 3 Encounters:  05/04/18 109/73  02/15/18 122/72  12/15/17 122/78       Past Medical History:  Diagnosis Date  . Anemia, unspecified   . Bell's palsy   . Blood transfusion    during treatment for Ca  . Blood transfusion without reported diagnosis   . CAD (coronary artery disease)    a. s/p multiple PCIs;  b. s/p CABG in 09/2010 (LIMA-LAD, SVG-OM1, SVG-distal RCA/OM2);   Cardiac cath in 12/2015: Mild LAD disease, occluded mid LCX and proximal RCA (left to right collaterals), Occluded SVG to RCA and atretic LIMA. Patent SVG to OM  . Chronic back pain   . Diabetes mellitus without complication ([place])   . DJD (degenerative joint disease)    low back & all over   . GERD (gastroesophageal reflux disease)   . Helicobacter pylori (H. pylori) infection   . Hemorrhoids   . Hyperlipidemia   . Hypertension   . Impotence of organic origin   . Myocardial infarction ([place])    2005 and 2012   . PAD (peripheral artery disease) ([place])    a. s/p bilat SFA stents;  b. ABIs 11/2012: R 0.99, L 0.86.  [name] Pancreatic cancer [facility])    surgery 2009  . Pre-diabetes 08/16/2015    Current Outpatient Medications  Medication Sig Dispense Refill  . aspirin EC 81 MG tablet Take 1 tablet (81 mg total) by mouth daily.    [name] atorvastatin (LIPITOR) 40 MG tablet Take 1 tablet (40 mg total) by mouth daily. (Patient taking differently: Take 40 mg by mouth at bedtime. ) 90 tablet 3  . fish oil-omega-3 fatty acids 1000 MG capsule Take 1 capsule (1 g total) by mouth daily. 60 capsule 1  . lisinopril (PRINIVIL,ZESTRIL) 5 MG tablet TAKE 0.5 TABLETS BY MOUTH  DAILY. 15 tablet 0  . metFORMIN (GLUCOPHAGE) 500 MG tablet Take 1 tablet (500 mg total) by mouth daily with breakfast. 90 tablet 3  . metoprolol tartrate (LOPRESSOR) 25 MG tablet Take 1 tablet by mouth two  times daily 60 tablet 0  . Multiple Vitamin (MULTIVITAMIN) tablet Take 1 tablet by mouth daily. [place]  tablet 10  . pregabalin (LYRICA) 150 MG capsule Take 1 capsule (150 mg total) by mouth daily. 30 capsule 0  . tamsulosin (FLOMAX) 0.4 MG CAPS capsule Take 1 capsule (0.4 mg total) by mouth daily. 30 capsule 6  . nitroGLYCERIN (NITROSTAT) 0.4 MG SL tablet Place 1 tablet (0.4 mg total) under the tongue every 5 (five) minutes as needed for chest pain. 25 tablet 2  . pantoprazole (PROTONIX) 40 MG tablet TAKE 1 TABLET BY MOUTH ONCE DAILY 30 tablet 10    No current facility-administered medications for this visit.     Allergies: No Known Allergies  Past Surgical History:  Procedure Laterality Date  . ABDOMINAL AORTAGRAM N/A 09/13/2013   Procedure: ABDOMINAL Maxcine Ham;  Surgeon: Wellington Hampshire, MD;  Location: [place] CATH LAB;  Service: Cardiovascular;  Laterality: N/A;  . ABDOMINAL AORTAGRAM N/A 08/29/2014   Procedure: ABDOMINAL Maxcine Ham;  Surgeon: Wellington Hampshire, MD;  Location: [place] CATH LAB;  Service: Cardiovascular;  Laterality: N/A;  . BACK SURGERY     1992  . CARDIAC CATHETERIZATION N/A 12/25/2015   Procedure: Left Heart Cath and Cors/Grafts Angiography;  Surgeon: Wellington Hampshire, MD;  Location: [place] CV LAB;  Service: Cardiovascular;  Laterality: N/A;  . CORONARY ARTERY BYPASS GRAFT  nov 2011   x 4  . ESOPHAGOGASTRODUODENOSCOPY (EGD) WITH PROPOFOL N/A 04/02/2017   Procedure: ESOPHAGOGASTRODUODENOSCOPY (EGD) WITH PROPOFOL;  Surgeon: Carol Ada, MD;  [facility];  Service: Endoscopy;  Laterality: N/A;  . HEMORRHOID SURGERY  10/30/2011   Procedure: HEMORRHOIDECTOMY;  Surgeon: Harl Bowie, MD;  Location: [place];  Service: General;  Laterality: N/A;  . JOINT REPLACEMENT    . pancreatic  cancer  05/10/2008   Resection of distal panrease and spleen  . PARTIAL KNEE ARTHROPLASTY Right 11/19/2014   Procedure: RIGHT UNI-KNEE ARTHROPLASTY MEDIALLY;  Surgeon: Mauri Pole, MD;  [facility];  Service: Orthopedics;  Laterality: Right;  . PERIPHERAL VASCULAR CATHETERIZATION  Oct 2014   Lt SFA PTA  . PERIPHERAL VASCULAR CATHETERIZATION  Oct 2015   ISR Lt SFA-PTA  . right partial knee replacement    . splenectomy    . THROMBECTOMY FEMORAL ARTERY Left 09/13/2013   Procedure: THROMBECTOMY FEMORAL ARTERY;  Surgeon: Wellington Hampshire, MD;  Location: [place] CATH LAB;  Service: Cardiovascular;  Laterality: Left;    Social History   Socioeconomic History  . Marital status: Single    Spouse name: Not on file  . Number of  children: 0  . Years of education: Not on file  . Highest education level: Not on file  Occupational History  . Occupation: TEFL teacher: UNEMPLOYED  Social Needs  . Financial resource strain: Not on file  . Food insecurity:    Worry: Not on file    Inability: Not on file  . Transportation needs:    Medical: Not on file    Non-medical: Not on file  Tobacco Use  . Smoking status: Former Smoker    Types: Cigarettes    Last attempt to quit: 11/29/2008    Years since quitting: 9.4  . Smokeless tobacco: Never Used  Substance and Sexual Activity  . Alcohol use: Yes    Alcohol/week: 1.8 oz    Types: 3 Shots of liquor per week    Comment: 2 drinks of brandy every week   . Drug use: No  . Sexual activity: Not Currently  Lifestyle  . Physical activity:    Days per week: Not  on file    Minutes per session: Not on file  . Stress: Not on file  Relationships  . Social connections:    Talks on phone: Not on file    Gets together: Not on file    Attends religious service: Not on file    Active member of club or organization: Not on file    Attends meetings of clubs or organizations: Not on file    Relationship status: Not on file  Other Topics Concern  . 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 Heart attack Father        died of MI at age 64  . Cancer Father   . Anesthesia problems Neg Hx   . Hypotension Neg Hx   . Malignant hyperthermia Neg Hx   . Pseudochol deficiency Neg Hx   . Colon cancer Neg Hx   . Esophageal cancer Neg Hx   . Prostate cancer Neg Hx   . Rectal cancer Neg Hx   . Stomach cancer Neg Hx      ROS Review of Systems See HPI Constitution: No fevers or chills No malaise No diaphoresis Skin: No rash or itching Eyes: no blurry vision, no double vision GU: no dysuria or hematuria Neuro: no  dizziness or headaches * all others reviewed and negative   Objective: Vitals:   05/04/18 1143  BP: 109/73  Pulse: 63  Resp: 17  Temp: 99 F (37.2 C)  TempSrc: Oral  SpO2: 94%  Weight: 216 lb 6.4 oz (98.2 kg)  Height: 5\' 9"  (1.753 m)    Physical Exam Physical Exam  Constitutional: She is oriented to person, place, and time. She appears well-developed and well-nourished.  HENT:  Head: Normocephalic and atraumatic.  Eyes: Conjunctivae and EOM are normal.  Cardiovascular: Normal rate, regular rhythm and normal heart sounds.   Pulmonary/Chest: Effort normal and breath sounds normal. No respiratory distress. She has no wheezes.  Abdominal: Normal appearance and bowel sounds are normal. There is no tenderness. There is no CVA tenderness.  Neurological: She is alert and oriented to person, place, and time.    Assessment and Plan Steven Hale was seen today for diabetes.  Diagnoses and all orders for this visit:  Type 2 diabetes mellitus with complication, without long-term current use of insulin ([place])- well controlled on metformin with diet and exercise -     HM Diabetes Foot Exam -     POCT glycosylated hemoglobin (Hb A1C) -     Comprehensive metabolic panel  Mixed hyperlipidemia- continue statin  Will assess control  -     Lipid panel -     Comprehensive metabolic panel  Coronary artery disease involving native heart with angina pectoris, unspecified vessel or lesion type ([place]) -  Continue current meds    Steven Hale

## 2018-05-04 NOTE — Patient Instructions (Signed)
     IF you received an x-ray today, you will receive an invoice from [facility]. Please contact [facility] at [phone] with questions or concerns regarding your invoice.   IF you received labwork today, you will receive an invoice from [facility]. Please contact [facility] at [phone] with questions or concerns regarding your invoice.   Our billing staff will not be able to assist you with questions regarding bills from these companies.  You will be contacted with the lab results as soon as they are available. The fastest way to get your results is to activate your My Chart account. Instructions are located on the last page of this paperwork. If you have not heard from us regarding the results in 2 weeks, please contact this office.     

## 2018-05-05 LAB — COMPREHENSIVE METABOLIC PANEL
A/G RATIO: 1.9 (ref 1.2–2.2)
ALT: 30 IU/L (ref 0–44)
AST: 25 IU/L (ref 0–40)
Albumin: 4.9 g/dL — ABNORMAL HIGH (ref 3.6–4.8)
Alkaline Phosphatase: 85 IU/L (ref 39–117)
BUN/Creatinine Ratio: 10 (ref 10–24)
BUN: 9 mg/dL (ref 8–27)
Bilirubin Total: 0.4 mg/dL (ref 0.0–1.2)
CALCIUM: 10 mg/dL (ref 8.6–10.2)
CO2: 22 mmol/L (ref 20–29)
CREATININE: 0.89 mg/dL (ref 0.76–1.27)
Chloride: 101 mmol/L (ref 96–106)
GFR, EST AFRICAN AMERICAN: 106 mL/min/{1.73_m2} (ref >59)
GFR, EST NON AFRICAN AMERICAN: 92 mL/min/{1.73_m2} (ref >59)
Globulin, Total: 2.6 g/dL (ref 1.5–4.5)
Glucose: 134 mg/dL — ABNORMAL HIGH (ref 65–99)
POTASSIUM: 4.2 mmol/L (ref 3.5–5.2)
Sodium: 139 mmol/L (ref 134–144)
TOTAL PROTEIN: 7.5 g/dL (ref 6.0–8.5)

## 2018-05-05 LAB — LIPID PANEL
CHOL/HDL RATIO: 3.5 ratio (ref 0.0–5.0)
CHOLESTEROL TOTAL: 137 mg/dL (ref 100–199)
HDL: 39 mg/dL — AB (ref >39)
LDL CALC: 58 mg/dL (ref 0–99)
TRIGLYCERIDES: 201 mg/dL — AB (ref 0–149)
VLDL Cholesterol Cal: 40 mg/dL (ref 5–40)

## 2018-05-06 MED FILL — LINZESS 145 MCG CAPSULE: 145 | 30 days supply | Qty: 30 | Fill #2

## 2018-05-09 ENCOUNTER — Other Ambulatory Visit: Payer: Self-pay | Admitting: Cardiovascular Disease

## 2018-05-09 MED FILL — PANTOPRAZOLE SOD DR 40 MG T: 40 | 30 days supply | Qty: 30 | Fill #0

## 2018-05-09 NOTE — Telephone Encounter (Signed)
Rx request sent to pharmacy.  

## 2018-05-09 NOTE — Telephone Encounter (Signed)
Refill Request.  

## 2018-05-12 MED FILL — TAMSULOSIN HCL 0.4 MG CAP: 0.4 | 30 days supply | Qty: 30 | Fill #5

## 2018-06-07 MED FILL — AMOXICILLIN 500 MG CAPSULE: 500 | 4 days supply | Qty: 16 | Fill #0

## 2018-06-07 MED FILL — CHLORHEXIDINE 0.12% RINSE: 0.12 | 15 days supply | Qty: 473 | Fill #0

## 2018-06-10 MED FILL — AMOXICILLIN 500 MG CAPSULE: 500 | 7 days supply | Qty: 28 | Fill #0

## 2018-06-10 MED FILL — TAMSULOSIN HCL 0.4 MG CAP: 0.4 | 30 days supply | Qty: 30 | Fill #6

## 2018-06-10 MED FILL — PANTOPRAZOLE SOD DR 40 MG T: 40 | 30 days supply | Qty: 30 | Fill #1

## 2018-06-14 MED FILL — HYDROCODON-APAP 5-325: 5-325 | 2 days supply | Qty: 6 | Fill #0

## 2018-06-17 MED FILL — HYDROCODON-APAP 5-325: 5-325 | 2 days supply | Qty: 8 | Fill #0

## 2018-06-22 ENCOUNTER — Ambulatory Visit (HOSPITAL_COMMUNITY)
Admission: RE | Admit: 2018-06-22 | Discharge: 2018-06-22 | Disposition: A | Discharge status: Home / Self Care | Payer: Medicare Other | Source: Ambulatory Visit | Attending: Internal Medicine | Admitting: Internal Medicine

## 2018-06-22 ENCOUNTER — Other Ambulatory Visit: Payer: Self-pay | Admitting: Cardiovascular Disease

## 2018-06-22 DIAGNOSIS — Z95820 Peripheral vascular angioplasty status with implants and grafts: Secondary | ICD-10-CM | POA: Diagnosis not present

## 2018-06-22 DIAGNOSIS — I739 Peripheral vascular disease, unspecified: Secondary | ICD-10-CM

## 2018-06-27 ENCOUNTER — Other Ambulatory Visit: Payer: Self-pay | Admitting: *Deleted

## 2018-06-27 DIAGNOSIS — I739 Peripheral vascular disease, unspecified: Secondary | ICD-10-CM

## 2018-06-28 MED FILL — AMOXICILLIN 500 MG CAPSULE: 500 | 7 days supply | Qty: 21 | Fill #0

## 2018-07-13 ENCOUNTER — Other Ambulatory Visit: Payer: Self-pay | Admitting: Family Medicine

## 2018-07-13 MED FILL — TAMSULOSIN HCL 0.4 MG CAP: 0.4 | 30 days supply | Qty: 30 | Fill #0

## 2018-08-05 MED FILL — PANTOPRAZOLE SOD DR 40 MG T: 40 | 30 days supply | Qty: 30 | Fill #2

## 2018-08-08 MED FILL — AMOXICILLIN 500 MG CAPSULE: 500 | 4 days supply | Qty: 16 | Fill #0

## 2018-08-14 MED FILL — TAMSULOSIN HCL 0.4 MG CAP: 0.4 | 30 days supply | Qty: 30 | Fill #1

## 2018-08-15 ENCOUNTER — Encounter: Payer: Self-pay | Admitting: Emergency Medicine

## 2018-08-15 ENCOUNTER — Ambulatory Visit: Payer: Medicare Other | Admitting: Family Medicine

## 2018-08-15 ENCOUNTER — Other Ambulatory Visit: Payer: Self-pay

## 2018-08-15 ENCOUNTER — Ambulatory Visit (INDEPENDENT_AMBULATORY_CARE_PROVIDER_SITE_OTHER): Payer: Medicare Other | Admitting: Emergency Medicine

## 2018-08-15 VITALS — BP 113/62 | HR 53 | Temp 98.5°F | Resp 16 | Ht 69.0 in | Wt 222.0 lb

## 2018-08-15 DIAGNOSIS — Z23 Encounter for immunization: Secondary | ICD-10-CM

## 2018-08-15 DIAGNOSIS — M109 Gout, unspecified: Secondary | ICD-10-CM

## 2018-08-15 DIAGNOSIS — M79672 Pain in left foot: Secondary | ICD-10-CM | POA: Diagnosis not present

## 2018-08-15 MED ORDER — HYDROCODONE-ACETAMINOPHEN 5-325 MG PO TABS: 1.0000 | ORAL_TABLET | Freq: Four times a day (QID) | ORAL | 0 refills | Status: DC | PRN

## 2018-08-15 MED ORDER — INDOMETHACIN 50 MG PO CAPS: 50.0000 mg | ORAL_CAPSULE | Freq: Two times a day (BID) | ORAL | 0 refills | Status: AC

## 2018-08-15 MED FILL — HYDROCODON-APAP 5-325: 5-325 | 3 days supply | Qty: 10 | Fill #0

## 2018-08-15 MED FILL — INDOMETHACIN 50 MG CAPSULE: 50 | 5 days supply | Qty: 10 | Fill #0

## 2018-08-15 NOTE — Progress Notes (Signed)
Steven Hale. 62 y.o.   Chief Complaint  Patient presents with  . Leg Pain    and feet x 3 weeks    HISTORY OF PRESENT ILLNESS: This is a 62 y.o. male complaining left foot pain that started several days ago. Patient has a history of diabetes, hypertension and coronary artery disease.  Also has a history of peripheral vascular disease.  HPI   Prior to Admission medications   Medication Sig Start Date End Date Taking? Authorizing Provider  aspirin EC 81 MG tablet Take 1 tablet (81 mg total) by mouth daily. 11/19/14  Yes Babish, Rodman Key, PA-C  atorvastatin (LIPITOR) 40 MG tablet Take 1 tablet (40 mg total) by mouth daily. Patient taking differently: Take 40 mg by mouth at bedtime.  07/07/16  Yes Wellington Hampshire, MD  fish oil-omega-3 fatty acids 1000 MG capsule Take 1 capsule (1 g total) by mouth daily. 11/30/12  Yes Rosana Hoes, MD  lisinopril (PRINIVIL,ZESTRIL) 5 MG tablet TAKE 0.5 TABLETS BY MOUTH  DAILY. 09/22/16  Yes Lelon Perla, MD  metFORMIN (GLUCOPHAGE) 500 MG tablet Take 1 tablet (500 mg total) by mouth daily with breakfast. 12/15/17  Yes Delia Chimes A, MD  metoprolol tartrate (LOPRESSOR) 25 MG tablet Take 1 tablet by mouth two  times daily 09/18/16  Yes Crenshaw, Denice Bors, MD  Multiple Vitamin (MULTIVITAMIN) tablet Take 1 tablet by mouth daily. 11/30/12  Yes Rosana Hoes, MD  pantoprazole (PROTONIX) 40 MG tablet TAKE 1 TABLET BY MOUTH ONCE DAILY 05/09/18  Yes Wellington Hampshire, MD  pregabalin (LYRICA) 150 MG capsule Take 1 capsule (150 mg total) by mouth daily. 05/02/18  Yes Stallings, Zoe A, MD  tamsulosin (FLOMAX) 0.4 MG CAPS capsule TAKE 1 CAPSULE BY MOUTH DAILY. 07/13/18  Yes [name], MD  nitroGLYCERIN (NITROSTAT) 0.4 MG SL tablet Place 1 tablet (0.4 mg total) under the tongue every 5 (five) minutes as needed for chest pain. 10/28/16 04/01/17  Lelon Perla, MD    No Known Allergies  Patient Active Problem List   Diagnosis Date Noted  . Uveitis 05/14/2017   . Eye pain, right 05/14/2017  . Blepharitis of eyelid of right eye 05/14/2017  . Coronary artery disease involving coronary bypass graft of native heart   . Pre-diabetes 08/16/2015  . Peripheral neuropathy 07/09/2015  . Other male erectile dysfunction 03/07/2015  . Essential hypertension, benign 03/07/2015  . Pure hypercholesterolemia 12/10/2014  . Essential hypertension 12/10/2014  . Low testosterone 12/10/2014  . S/P right UKR 11/19/2014  . Lumbar disc herniation with radiculopathy 10/15/2014  . Back pain 10/15/2014  . Acute back pain   . Chronic back pain 10/12/2014  . H/O pancreatic cancer 2009 10/12/2014  . Iron deficiency anemia 06/11/2014  . Testosterone deficiency 09/07/2013  . Benign paroxysmal positional vertigo 08/30/2013  . PAD (peripheral artery disease) ([place]) 06/02/2012  . Precordial pain 05/14/2011  . Claudication ([place]) 05/14/2011  . BLOOD IN STOOL 09/20/2009  . PVD (peripheral vascular disease) ([place]) 01/08/2009  . BELL'S PALSY, RIGHT 12/25/2008  . Hyperlipidemia 06/05/2008  . CAD- multiple PCIs, CABG 2011, low risk Myoview 05/2012 11/17/2003    Past Medical History:  Diagnosis Date  . Anemia, unspecified   . Bell's palsy   . Blood transfusion    during treatment for Ca  . Blood transfusion without reported diagnosis   . CAD (coronary artery disease)    a. s/p multiple PCIs;  b. s/p CABG in 09/2010 (LIMA-LAD, SVG-OM1, SVG-distal RCA/OM2);  Cardiac  cath in 12/2015: Mild LAD disease, occluded mid LCX and proximal RCA (left to right collaterals), Occluded SVG to RCA and atretic LIMA. Patent SVG to OM  . Chronic back pain   . Diabetes mellitus without complication ([place])   . DJD (degenerative joint disease)    low back & all over   . GERD (gastroesophageal reflux disease)   . Helicobacter pylori (H. pylori) infection   . Hemorrhoids   . Hyperlipidemia   . Hypertension   . Impotence of organic origin   . Myocardial infarction ([place])    2005 and 2012   . PAD  (peripheral artery disease) ([place])    a. s/p bilat SFA stents;  b. ABIs 11/2012: R 0.99, L 0.86.  [name] Pancreatic cancer [facility])    surgery 2009  . Pre-diabetes 08/16/2015    Past Surgical History:  Procedure Laterality Date  . ABDOMINAL AORTAGRAM N/A 09/13/2013   Procedure: ABDOMINAL Maxcine Ham;  Surgeon: Wellington Hampshire, MD;  Location: [place] CATH LAB;  Service: Cardiovascular;  Laterality: N/A;  . ABDOMINAL AORTAGRAM N/A 08/29/2014   Procedure: ABDOMINAL Maxcine Ham;  Surgeon: Wellington Hampshire, MD;  Location: [place] CATH LAB;  Service: Cardiovascular;  Laterality: N/A;  . BACK SURGERY     1992  . CARDIAC CATHETERIZATION N/A 12/25/2015   Procedure: Left Heart Cath and Cors/Grafts Angiography;  Surgeon: Wellington Hampshire, MD;  Location: [place] CV LAB;  Service: Cardiovascular;  Laterality: N/A;  . CORONARY ARTERY BYPASS GRAFT  nov 2011   x 4  . ESOPHAGOGASTRODUODENOSCOPY (EGD) WITH PROPOFOL N/A 04/02/2017   Procedure: ESOPHAGOGASTRODUODENOSCOPY (EGD) WITH PROPOFOL;  Surgeon: Carol Ada, MD;  [facility];  Service: Endoscopy;  Laterality: N/A;  . HEMORRHOID SURGERY  10/30/2011   Procedure: HEMORRHOIDECTOMY;  Surgeon: Harl Bowie, MD;  Location: [place];  Service: General;  Laterality: N/A;  . JOINT REPLACEMENT    . pancreatic  cancer  05/10/2008   Resection of distal panrease and spleen  . PARTIAL KNEE ARTHROPLASTY Right 11/19/2014   Procedure: RIGHT UNI-KNEE ARTHROPLASTY MEDIALLY;  Surgeon: Mauri Pole, MD;  [facility] ORS;  Service: Orthopedics;  Laterality: Right;  . PERIPHERAL VASCULAR CATHETERIZATION  Oct 2014   Lt SFA PTA  . PERIPHERAL VASCULAR CATHETERIZATION  Oct 2015   ISR Lt SFA-PTA  . right partial knee replacement    . splenectomy    . THROMBECTOMY FEMORAL ARTERY Left 09/13/2013   Procedure: THROMBECTOMY FEMORAL ARTERY;  Surgeon: Wellington Hampshire, MD;  Location: [place] CATH LAB;  Service: Cardiovascular;  Laterality: Left;    Social History   Socioeconomic History    . Marital status: Single    Spouse name: Not on file  . Number of children: 0  . Years of education: Not on file  . Highest education level: Not on file  Occupational History  . Occupation: TEFL teacher: UNEMPLOYED  Social Needs  . Financial resource strain: Not on file  . Food insecurity:    Worry: Not on file    Inability: Not on file  . Transportation needs:    Medical: Not on file    Non-medical: Not on file  Tobacco Use  . Smoking status: Former Smoker    Types: Cigarettes    Last attempt to quit: 11/29/2008    Years since quitting: 9.7  . Smokeless tobacco: Never Used  Substance and Sexual Activity  . Alcohol use: Yes    Alcohol/week: 3.0 standard drinks    Types: 3 Shots  of liquor per week    Comment: 2 drinks of brandy every week   . Drug use: No  . Sexual activity: Not Currently  Lifestyle  . Physical activity:    Days per week: Not on file    Minutes per session: Not on file  . Stress: Not on file  Relationships  . Social connections:    Talks on phone: Not on file    Gets together: Not on file    Attends religious service: Not on file    Active member of club or organization: Not on file    Attends meetings of clubs or organizations: Not on file    Relationship status: Not on file  . Intimate partner violence:    Fear of current or ex partner: Not on file    Emotionally abused: Not on file    Physically abused: Not on file    Forced sexual activity: Not on file  Other Topics Concern  . Not on file  Social History Narrative   He works at the night shift at [employer] part time   [phone]   Single, no children    Family History  Problem Relation Age of Onset  . Heart attack Father        died of MI at age 44  . Cancer Father   . Anesthesia problems Neg Hx   . Hypotension Neg Hx   . Malignant hyperthermia Neg Hx   . Pseudochol deficiency Neg Hx   . Colon cancer Neg Hx   . Esophageal cancer Neg Hx   . Prostate cancer Neg  Hx   . Rectal cancer Neg Hx   . Stomach cancer Neg Hx      Review of Systems  Constitutional: Negative.  Negative for chills and fever.  Respiratory: Negative for cough and shortness of breath.   Cardiovascular: Negative for chest pain and palpitations.  Gastrointestinal: Negative for abdominal pain, diarrhea, nausea and vomiting.  Musculoskeletal: Positive for joint pain.  Skin: Negative for rash.  Neurological: Negative.  Negative for dizziness and headaches.  Endo/Heme/Allergies: Negative.   All other systems reviewed and are negative.   Vitals:   08/15/18 1042  BP: 113/62  Pulse: (!) 53  Resp: 16  Temp: 98.5 F (36.9 C)  SpO2: 97%    Physical Exam  Constitutional: He is oriented to person, place, and time. He appears well-developed and well-nourished.  HENT:  Head: Normocephalic and atraumatic.  Eyes: Pupils are equal, round, and reactive to light.  Neck: Normal range of motion. Neck supple.  Cardiovascular: Normal rate and regular rhythm.  Pulmonary/Chest: Effort normal and breath sounds normal.  Musculoskeletal:  Left foot: Positive swelling and erythema to the first metatarsophalangeal joint.  Very tender to palpation.  Findings compatible with gout. NVI. Lower extremities: No signs of arterial occlusion or DVT.  Neurological: He is alert and oriented to person, place, and time.  Skin: Skin is warm and dry. Capillary refill takes less than 2 seconds.  Psychiatric: He has a normal mood and affect. His behavior is normal.  Vitals reviewed.    A total of 25 minutes was spent in the room with the patient, greater than 50% of which was in counseling/coordination of care regarding gout, treatment, nutrition, medications, and need for follow-up if no better or worse.   ASSESSMENT & PLAN: Chigozie was seen today for leg pain.  Diagnoses and all orders for this visit:  Acute gouty arthritis -  indomethacin (INDOCIN) 50 MG capsule; Take 1 capsule (50 mg total) by  mouth 2 (two) times daily with a meal for 3 days.  Left foot pain -     HYDROcodone-acetaminophen (NORCO) 5-325 MG tablet; Take 1 tablet by mouth every 6 (six) hours as needed for moderate pain. -     indomethacin (INDOCIN) 50 MG capsule; Take 1 capsule (50 mg total) by mouth 2 (two) times daily with a meal for 3 days.  Need for prophylactic vaccination and inoculation against influenza -     Flu Vaccine QUAD 36+ mos IM    Patient Instructions  Gout Gout is painful swelling that can happen in some of your joints. Gout is a type of arthritis. This condition is caused by having too much uric acid in your body. Uric acid is a chemical that is made when your body breaks down substances called purines. If your body has too much uric acid, sharp crystals can form and build up in your joints. This causes pain and swelling. Gout attacks can happen quickly and be very painful (acute gout). Over time, the attacks can affect more joints and happen more often (chronic gout). Follow these instructions at home: During a Gout Attack  If directed, put ice on the painful area: ? Put ice in a plastic bag. ? Place a towel between your skin and the bag. ? Leave the ice on for 20 minutes, 2-3 times a day.  Rest the joint as much as possible. If the joint is in your leg, you may be given crutches to use.  Raise (elevate) the painful joint above the level of your heart as often as you can.  Drink enough fluids to keep your pee (urine) clear or pale yellow.  Take over-the-counter and prescription medicines only as told by your doctor.  Do not drive or use heavy machinery while taking prescription pain medicine.  Follow instructions from your doctor about what you can or cannot eat and drink.  Return to your normal activities as told by your doctor. Ask your doctor what activities are safe for you. Avoiding Future Gout Attacks  Follow a low-purine diet as told by a specialist (dietitian) or your doctor.  Avoid foods and drinks that have a lot of purines, such as: ? Liver. ? Kidney. ? Anchovies. ? Asparagus. ? Herring. ? Mushrooms ? Mussels. ? Beer.  Limit alcohol intake to no more than 1 drink a day for nonpregnant women and 2 drinks a day for men. One drink equals 12 oz of beer, 5 oz of wine, or 1 oz of hard liquor.  Stay at a healthy weight or lose weight if you are overweight. If you want to lose weight, talk with your doctor. It is important that you do not lose weight too fast.  Start or continue an exercise plan as told by your doctor.  Drink enough fluids to keep your pee clear or pale yellow.  Take over-the-counter and prescription medicines only as told by your doctor.  Keep all follow-up visits as told by your doctor. This is important. Contact a doctor if:  You have another gout attack.  You still have symptoms of a gout attack after10 days of treatment.  You have problems (side effects) because of your medicines.  You have chills or a fever.  You have burning pain when you pee (urinate).  You have pain in your lower back or belly. Get help right away if:  You have very bad pain.  Your pain cannot be controlled.  You cannot pee. This information is not intended to replace advice given to you by your health care provider. Make sure you discuss any questions you have with your health care provider. Document Released: 08/11/2008 Document Revised: 04/09/2016 Document Reviewed: 08/15/2015 Elsevier Interactive Patient Education  2018 Elsevier Inc.      Agustina Caroli, MD [facility]

## 2018-08-15 NOTE — Patient Instructions (Signed)

## 2018-08-16 ENCOUNTER — Ambulatory Visit (INDEPENDENT_AMBULATORY_CARE_PROVIDER_SITE_OTHER): Payer: Medicare Other | Admitting: Cardiovascular Disease

## 2018-08-16 ENCOUNTER — Encounter: Payer: Self-pay | Admitting: Cardiovascular Disease

## 2018-08-16 VITALS — BP 116/72 | HR 67 | Ht 69.0 in | Wt 225.8 lb

## 2018-08-16 DIAGNOSIS — I25118 Atherosclerotic heart disease of native coronary artery with other forms of angina pectoris: Secondary | ICD-10-CM | POA: Diagnosis not present

## 2018-08-16 DIAGNOSIS — E782 Mixed hyperlipidemia: Secondary | ICD-10-CM

## 2018-08-16 DIAGNOSIS — I10 Essential (primary) hypertension: Secondary | ICD-10-CM

## 2018-08-16 DIAGNOSIS — I739 Peripheral vascular disease, unspecified: Secondary | ICD-10-CM | POA: Diagnosis not present

## 2018-08-16 NOTE — Patient Instructions (Signed)
Medication Instructions:  Your physician recommends that you continue on your current medications as directed. Please refer to the Current Medication list given to you today.   Labwork: None ordered  Testing/Procedures: None ordered  Follow-Up: Your physician wants you to follow-up in: 12 months with Dr.Arida You will receive a reminder letter in the mail two months in advance. If you don't receive a letter, please call our office to schedule the follow-up appointment.   Any Other Special Instructions Will Be Listed Below (If Applicable).     If you need a refill on your cardiac medications before your next appointment, please call your pharmacy.

## 2018-08-16 NOTE — Progress Notes (Signed)
Cardiology Office Note   Date:  08/16/2018   ID:  Steven Hale., DOB 1956-11-04, MRN [id number]  PCP:  [name], MD  Cardiologist:  Dr. Lionel December chief complaint on file.     History of Present Illness: Steven Hale. is a 62 y.o. male who presents for a followup visit regarding peripheral arterial disease and coronary artery disease. He has known history of coronary artery disease with multiple interventions in the past. He had CABG in 2011.  He quit smoking in 2009.  He has known history of peripheral arterial disease with intervention on both SFAs. Cardiac catheterization in January, 2017 showed mild nonobstructive LAD disease, occluded mid left circumflex and occluded proximal right coronary artery with left-to-right collaterals. SVG to RCA was occluded and LIMA to LAD was atretic. SVG to OM was normal. Abnormal stress test was due to chronically occluded right coronary artery and graft with left-to-right collaterals. His native RCA was not favorable for CTO PCI. I recommended medical therapy. Imdur was added.   Most recent vascular studies in August 2019 showed normal ABI and patent SFAs bilaterally.  The patient is known to have severe diabetic peripheral neuropathy.  This has partially responded to Lyrica. He has been doing well overall with no recent chest pain, shortness of breath or claudication.   Past Medical History:  Diagnosis Date  . Anemia, unspecified   . Bell's palsy   . Blood transfusion    during treatment for Ca  . Blood transfusion without reported diagnosis   . CAD (coronary artery disease)    a. s/p multiple PCIs;  b. s/p CABG in 09/2010 (LIMA-LAD, SVG-OM1, SVG-distal RCA/OM2);  Cardiac cath in 12/2015: Mild LAD disease, occluded mid LCX and proximal RCA (left to right collaterals), Occluded SVG to RCA and atretic LIMA. Patent SVG to OM  . Chronic back pain   . Diabetes mellitus without complication ([place])   . DJD (degenerative  joint disease)    low back & all over   . GERD (gastroesophageal reflux disease)   . Helicobacter pylori (H. pylori) infection   . Hemorrhoids   . Hyperlipidemia   . Hypertension   . Impotence of organic origin   . Myocardial infarction ([place])    2005 and 2012   . PAD (peripheral artery disease) ([place])    a. s/p bilat SFA stents;  b. ABIs 11/2012: R 0.99, L 0.86.  [name] Pancreatic cancer [facility])    surgery 2009  . Pre-diabetes 08/16/2015    Past Surgical History:  Procedure Laterality Date  . ABDOMINAL AORTAGRAM N/A 09/13/2013   Procedure: ABDOMINAL Maxcine Ham;  Surgeon: Wellington Hampshire, MD;  Location: [place] CATH LAB;  Service: Cardiovascular;  Laterality: N/A;  . ABDOMINAL AORTAGRAM N/A 08/29/2014   Procedure: ABDOMINAL Maxcine Ham;  Surgeon: Wellington Hampshire, MD;  Location: [place] CATH LAB;  Service: Cardiovascular;  Laterality: N/A;  . BACK SURGERY     1992  . CARDIAC CATHETERIZATION N/A 12/25/2015   Procedure: Left Heart Cath and Cors/Grafts Angiography;  Surgeon: Wellington Hampshire, MD;  Location: [place] CV LAB;  Service: Cardiovascular;  Laterality: N/A;  . CORONARY ARTERY BYPASS GRAFT  nov 2011   x 4  . ESOPHAGOGASTRODUODENOSCOPY (EGD) WITH PROPOFOL N/A 04/02/2017   Procedure: ESOPHAGOGASTRODUODENOSCOPY (EGD) WITH PROPOFOL;  Surgeon: Carol Ada, MD;  [facility];  Service: Endoscopy;  Laterality: N/A;  . HEMORRHOID SURGERY  10/30/2011   Procedure: HEMORRHOIDECTOMY;  Surgeon: Harl Bowie, MD;  [facility] OR;  Service: General;  Laterality: N/A;  . JOINT REPLACEMENT    . pancreatic  cancer  05/10/2008   Resection of distal panrease and spleen  . PARTIAL KNEE ARTHROPLASTY Right 11/19/2014   Procedure: RIGHT UNI-KNEE ARTHROPLASTY MEDIALLY;  Surgeon: Mauri Pole, MD;  [facility] ORS;  Service: Orthopedics;  Laterality: Right;  . PERIPHERAL VASCULAR CATHETERIZATION  Oct 2014   Lt SFA PTA  . PERIPHERAL VASCULAR CATHETERIZATION  Oct 2015   ISR Lt SFA-PTA  . right  partial knee replacement    . splenectomy    . THROMBECTOMY FEMORAL ARTERY Left 09/13/2013   Procedure: THROMBECTOMY FEMORAL ARTERY;  Surgeon: Wellington Hampshire, MD;  Location: [place] CATH LAB;  Service: Cardiovascular;  Laterality: Left;     Current Outpatient Medications  Medication Sig Dispense Refill  . aspirin EC 81 MG tablet Take 1 tablet (81 mg total) by mouth daily.    [name] atorvastatin (LIPITOR) 40 MG tablet Take 1 tablet (40 mg total) by mouth daily. (Patient taking differently: Take 40 mg by mouth at bedtime. ) 90 tablet 3  . fish oil-omega-3 fatty acids 1000 MG capsule Take 1 capsule (1 g total) by mouth daily. 60 capsule 1  . HYDROcodone-acetaminophen (NORCO) 5-325 MG tablet Take 1 tablet by mouth every 6 (six) hours as needed for moderate pain. 10 tablet 0  . indomethacin (INDOCIN) 50 MG capsule Take 1 capsule (50 mg total) by mouth 2 (two) times daily with a meal for 3 days. 10 capsule 0  . lisinopril (PRINIVIL,ZESTRIL) 5 MG tablet TAKE 0.5 TABLETS BY MOUTH  DAILY. 15 tablet 0  . metFORMIN (GLUCOPHAGE) 500 MG tablet Take 1 tablet (500 mg total) by mouth daily with breakfast. 90 tablet 3  . metoprolol tartrate (LOPRESSOR) 25 MG tablet Take 1 tablet by mouth two  times daily 60 tablet 0  . Multiple Vitamin (MULTIVITAMIN) tablet Take 1 tablet by mouth daily. 30 tablet 10  . pantoprazole (PROTONIX) 40 MG tablet TAKE 1 TABLET BY MOUTH ONCE DAILY 30 tablet 10  . pregabalin (LYRICA) 150 MG capsule Take 1 capsule (150 mg total) by mouth daily. 30 capsule 0  . tamsulosin (FLOMAX) 0.4 MG CAPS capsule TAKE 1 CAPSULE BY MOUTH DAILY. 30 capsule 6  . nitroGLYCERIN (NITROSTAT) 0.4 MG SL tablet Place 1 tablet (0.4 mg total) under the tongue every 5 (five) minutes as needed for chest pain. 25 tablet 2   No current facility-administered medications for this visit.     Allergies:   Patient has no known allergies.    Social History:  The patient  reports that he quit smoking about 9 years ago. His  smoking use included cigarettes. He has never used smokeless tobacco. He reports that he drinks about 3.0 standard drinks of alcohol per week. He reports that he does not use drugs.   Family History:  The patient's family history includes Cancer in his father; Heart attack in his father.    ROS:  Please see the history of present illness.   Otherwise, review of systems are positive for none.   All other systems are reviewed and negative.    PHYSICAL EXAM: VS:  BP 116/72 (BP Location: Left Arm, Patient Position: Sitting, Cuff Size: Large)   Pulse 67   Ht 5\' 9"  (1.753 m)   Wt 225 lb 12.8 oz (102.4 kg)   BMI 33.34 kg/m  , BMI Body mass index is 33.34 kg/m. GEN: Well nourished, well developed, in no acute  distress  HEENT: normal  Neck: no JVD, carotid bruits, or masses Cardiac: RRR; no murmurs, rubs, or gallops,no edema  Respiratory:  clear to auscultation bilaterally, normal work of breathing GI: soft, nontender, nondistended, + BS MS: no deformity or atrophy  Skin: warm and dry, no rash Neuro:  Strength and sensation are intact Psych: euthymic mood, full affect Distal pulses are not palpable.  EKG:  EKG is not ordered today.    Recent Labs: 11/11/2017: Hemoglobin 12.6; Platelets 299; TSH 1.420 05/04/2018: ALT 30; BUN 9; Creatinine, Ser 0.89; Potassium 4.2; Sodium 139    Lipid Panel    Component Value Date/Time   CHOL 137 05/04/2018 1219   TRIG 201 (H) 05/04/2018 1219   HDL 39 (L) 05/04/2018 1219   CHOLHDL 3.5 05/04/2018 1219   CHOLHDL 3.2 08/17/2016 0925   VLDL 28 08/17/2016 0925   LDLCALC 58 05/04/2018 1219   LDLDIRECT 80 11/06/2013 0925      Wt Readings from Last 3 Encounters:  08/16/18 225 lb 12.8 oz (102.4 kg)  08/15/18 222 lb (100.7 kg)  05/04/18 216 lb 6.4 oz (98.2 kg)        ASSESSMENT AND PLAN:   1. Peripheral arterial disease: Previous bilateral SFA intervention.  No recurrent claudication.  ABI was normal in August with patent SFA stents.   Continue medical therapy.  2. Peripheral diabetic neuropathy: Significant improvement in symptoms on Lyrica.  3. Coronary artery disease involving native coronary arteries with stable angina:  Symptoms are well-controlled with medications.  4. Essential hypertension: Blood pressure is controlled on current medications.  5. Hyperlipidemia: Continue treatment with atorvastatin.  LDL improved gradually and most recent was 58 in June.  6.  Erectile dysfunction: There is no contraindication for using phosphodiesterase inhibitors such as sildenafil.  He is not on nitroglycerin.  I will forward to PCP.  Disposition:   FU with me in 12 months  Signed,  Kathlyn Sacramento, MD  08/16/2018 8:24 AM    [place]

## 2018-08-25 ENCOUNTER — Other Ambulatory Visit: Payer: Self-pay | Admitting: Emergency Medicine

## 2018-08-25 ENCOUNTER — Telehealth: Payer: Self-pay | Admitting: *Deleted

## 2018-08-25 MED ORDER — SILDENAFIL CITRATE 100 MG PO TABS: 50.0000 mg | ORAL_TABLET | Freq: Every day | ORAL | 11 refills | Status: DC | PRN

## 2018-08-25 NOTE — Telephone Encounter (Signed)
Taken care of. Thanks!

## 2018-08-25 NOTE — Telephone Encounter (Signed)
Copied from [place] [phone]. Topic: General - Other >> Aug 18, 2018  2:54 PM Oneta Rack wrote: Relation to pt: self  Call back number:[phone]   Reason for call:  Patient was seen by his Cardiologist Wellington Hampshire, MD Tuesday 08/16/18 and was advised specialist would contact PCP office on patient behalf requesting viagra , please advise

## 2018-09-06 MED FILL — PANTOPRAZOLE SOD DR 40 MG T: 40 | 30 days supply | Qty: 30 | Fill #3

## 2018-09-14 MED FILL — TAMSULOSIN HCL 0.4 MG CAP: 0.4 | 30 days supply | Qty: 30 | Fill #2

## 2018-10-27 MED FILL — TAMSULOSIN HCL 0.4 MG CAP: 0.4 | 30 days supply | Qty: 30 | Fill #3

## 2018-10-27 MED FILL — PANTOPRAZOLE SOD DR 40 MG T: 40 | 30 days supply | Qty: 30 | Fill #4

## 2018-11-02 DIAGNOSIS — D352 Benign neoplasm of pituitary gland: Secondary | ICD-10-CM | POA: Diagnosis not present

## 2018-11-07 ENCOUNTER — Ambulatory Visit (INDEPENDENT_AMBULATORY_CARE_PROVIDER_SITE_OTHER): Payer: Medicare Other | Admitting: Family Medicine

## 2018-11-07 ENCOUNTER — Other Ambulatory Visit: Payer: Self-pay

## 2018-11-07 ENCOUNTER — Encounter: Payer: Self-pay | Admitting: Family Medicine

## 2018-11-07 VITALS — BP 122/79 | HR 65 | Temp 97.7°F | Resp 17 | Ht 69.0 in | Wt 228.4 lb

## 2018-11-07 DIAGNOSIS — D352 Benign neoplasm of pituitary gland: Secondary | ICD-10-CM | POA: Diagnosis not present

## 2018-11-07 DIAGNOSIS — E782 Mixed hyperlipidemia: Secondary | ICD-10-CM | POA: Diagnosis not present

## 2018-11-07 DIAGNOSIS — E1142 Type 2 diabetes mellitus with diabetic polyneuropathy: Secondary | ICD-10-CM

## 2018-11-07 DIAGNOSIS — E1143 Type 2 diabetes mellitus with diabetic autonomic (poly)neuropathy: Secondary | ICD-10-CM | POA: Diagnosis not present

## 2018-11-07 LAB — POCT GLYCOSYLATED HEMOGLOBIN (HGB A1C): Hemoglobin A1C: 6.9 % — AB (ref 4.0–5.6)

## 2018-11-07 NOTE — Progress Notes (Signed)
Established Patient Office Visit  Subjective:  Patient ID: Steven Ramone., male    DOB: [birth year]  Age: 62 y.o. MRN: [id number]  CC:  Chief Complaint  Patient presents with  . Diabetes    F/U    HPI Steven Hale. presents for   Diabetes Mellitus: Patient presents for follow up of diabetes. Symptoms: paresthesia of the feet. Symptoms have been well-controlled. Patient denies hyperglycemia, hypoglycemia , increase appetite, nausea, paresthesia of the feet, polydipsia and polyuria.  Evaluation to date has been included: hemoglobin A1C.  Home sugars: BGs consistently in an acceptable range.  Wt Readings from Last 3 Encounters:  11/07/18 228 lb 6.4 oz (103.6 kg)  08/16/18 225 lb 12.8 oz (102.4 kg)  08/15/18 222 lb (100.7 kg)   Lab Results  Component Value Date   HGBA1C 6.9 (A) 11/07/2018    Dyslipidemia: Patient presents for evaluation of lipids.  Compliance with treatment thus far has been excellent.  A repeat fasting lipid profile was done.  The patient does not use medications that may worsen dyslipidemias (corticosteroids, progestins, anabolic steroids, diuretics, beta-blockers, amiodarone, cyclosporine, olanzapine). The patient exercises intermittently.  The patient is known to have coexisting coronary artery disease.   Pituitary Adenoma He reports that he feels like there is always a loud noise in his head.  States that he has night time vision problems States that he was diagnosed with a pituitary adenoma by his Ophthalmologist. He is being referred to Neurosurgery at the [place].  Currently he denies any nipple discharge.   Past Medical History:  Diagnosis Date  . Anemia, unspecified   . Bell's palsy   . Blood transfusion    during treatment for Ca  . Blood transfusion without reported diagnosis   . CAD (coronary artery disease)    a. s/p multiple PCIs;  b. s/p CABG in 09/2010 (LIMA-LAD, SVG-OM1, SVG-distal RCA/OM2);  Cardiac cath in 12/2015: Mild LAD disease,  occluded mid LCX and proximal RCA (left to right collaterals), Occluded SVG to RCA and atretic LIMA. Patent SVG to OM  . Chronic back pain   . Diabetes mellitus without complication ([place])   . DJD (degenerative joint disease)    low back & all over   . GERD (gastroesophageal reflux disease)   . Helicobacter pylori (H. pylori) infection   . Hemorrhoids   . Hyperlipidemia   . Hypertension   . Impotence of organic origin   . Myocardial infarction ([place])    2005 and 2012   . PAD (peripheral artery disease) ([place])    a. s/p bilat SFA stents;  b. ABIs 11/2012: R 0.99, L 0.86.  [name] Pancreatic cancer [facility])    surgery 2009  . Pre-diabetes 08/16/2015    Past Surgical History:  Procedure Laterality Date  . ABDOMINAL AORTAGRAM N/A 09/13/2013   Procedure: ABDOMINAL Maxcine Ham;  Surgeon: Wellington Hampshire, MD;  Location: [facility] CATH LAB;  Service: Cardiovascular;  Laterality: N/A;  . ABDOMINAL AORTAGRAM N/A 08/29/2014   Procedure: ABDOMINAL Maxcine Ham;  Surgeon: Wellington Hampshire, MD;  Location: [place] CATH LAB;  Service: Cardiovascular;  Laterality: N/A;  . BACK SURGERY     1992  . CARDIAC CATHETERIZATION N/A 12/25/2015   Procedure: Left Heart Cath and Cors/Grafts Angiography;  Surgeon: Wellington Hampshire, MD;  Location: [facility] CV LAB;  Service: Cardiovascular;  Laterality: N/A;  . CORONARY ARTERY BYPASS GRAFT  nov 2011   x 4  . ESOPHAGOGASTRODUODENOSCOPY (EGD) WITH PROPOFOL N/A 04/02/2017   Procedure: ESOPHAGOGASTRODUODENOSCOPY (EGD)  WITH PROPOFOL;  Surgeon: Carol Ada, MD;  Location: [facility] ENDOSCOPY;  Service: Endoscopy;  Laterality: N/A;  . HEMORRHOID SURGERY  10/30/2011   Procedure: HEMORRHOIDECTOMY;  Surgeon: Harl Bowie, MD;  Location: [facility];  Service: General;  Laterality: N/A;  . JOINT REPLACEMENT    . pancreatic  cancer  05/10/2008   Resection of distal panrease and spleen  . PARTIAL KNEE ARTHROPLASTY Right 11/19/2014   Procedure: RIGHT UNI-KNEE ARTHROPLASTY MEDIALLY;  Surgeon: Mauri Pole,  MD;  Location: [facility] ORS;  Service: Orthopedics;  Laterality: Right;  . PERIPHERAL VASCULAR CATHETERIZATION  Oct 2014   Lt SFA PTA  . PERIPHERAL VASCULAR CATHETERIZATION  Oct 2015   ISR Lt SFA-PTA  . right partial knee replacement    . splenectomy    . THROMBECTOMY FEMORAL ARTERY Left 09/13/2013   Procedure: THROMBECTOMY FEMORAL ARTERY;  Surgeon: Wellington Hampshire, MD;  Location: [facility] CATH LAB;  Service: Cardiovascular;  Laterality: Left;    Family History  Problem Relation Age of Onset  . Heart attack Father        died of MI at age 18  . Cancer Father   . Anesthesia problems Neg Hx   . Hypotension Neg Hx   . Malignant hyperthermia Neg Hx   . Pseudochol deficiency Neg Hx   . Colon cancer Neg Hx   . Esophageal cancer Neg Hx   . Prostate cancer Neg Hx   . Rectal cancer Neg Hx   . Stomach cancer Neg Hx     Social History   Socioeconomic History  . Marital status: Single    Spouse name: Not on file  . Number of children: 0  . Years of education: Not on file  . Highest education level: Not on file  Occupational History  . Occupation: TEFL teacher: UNEMPLOYED  Social Needs  . Financial resource strain: Not on file  . Food insecurity:    Worry: Not on file    Inability: Not on file  . Transportation needs:    Medical: Not on file    Non-medical: Not on file  Tobacco Use  . Smoking status: Former Smoker    Types: Cigarettes    Last attempt to quit: 11/29/2008    Years since quitting: 9.9  . Smokeless tobacco: Never Used  Substance and Sexual Activity  . Alcohol use: Yes    Alcohol/week: 3.0 standard drinks    Types: 3 Shots of liquor per week    Comment: 2 drinks of brandy every week   . Drug use: No  . Sexual activity: Not Currently  Lifestyle  . Physical activity:    Days per week: Not on file    Minutes per session: Not on file  . Stress: Not on file  Relationships  . Social connections:    Talks on phone: Not on file    Gets together: Not on file     Attends religious service: Not on file    Active member of club or organization: Not on file    Attends meetings of clubs or organizations: Not on file    Relationship status: Not on file  . Intimate partner violence:    Fear of current or ex partner: Not on file    Emotionally abused: Not on file    Physically abused: Not on file    Forced sexual activity: Not on file  Other Topics Concern  . 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 aspirin EC 81 MG tablet Take 1 tablet (81 mg total) by mouth daily.    [name] atorvastatin (LIPITOR) 40 MG tablet Take 1 tablet (40 mg total) by mouth daily. (Patient taking differently: Take 40 mg by mouth at bedtime. ) 90 tablet 3  . fish oil-omega-3 fatty acids 1000 MG capsule Take 1 capsule (1 g total) by mouth daily. 60 capsule 1  . lisinopril (PRINIVIL,ZESTRIL) 5 MG tablet TAKE 0.5 TABLETS BY MOUTH  DAILY. 15 tablet 0  . metFORMIN (GLUCOPHAGE) 500 MG tablet Take 1 tablet (500 mg total) by mouth daily with breakfast. 90 tablet 3  . metoprolol tartrate (LOPRESSOR) 25 MG tablet Take 1 tablet by mouth two  times daily 60 tablet 0  . Multiple Vitamin (MULTIVITAMIN) tablet Take 1 tablet by mouth daily. 30 tablet 10  . pantoprazole (PROTONIX) 40 MG tablet TAKE 1 TABLET BY MOUTH ONCE DAILY 30 tablet 10  . pregabalin (LYRICA) 150 MG capsule Take 1 capsule (150 mg total) by mouth daily. 30 capsule 0  . tamsulosin (FLOMAX) 0.4 MG CAPS capsule TAKE 1 CAPSULE BY MOUTH DAILY. 30 capsule 6  . HYDROcodone-acetaminophen (NORCO) 5-325 MG tablet Take 1 tablet by mouth every 6 (six) hours as needed for moderate pain. (Patient not taking: Reported on 11/07/2018) 10 tablet 0  . sildenafil (VIAGRA) 100 MG tablet Take 0.5-1 tablets (50-100 mg total) by mouth daily as needed for erectile dysfunction. (Patient  not taking: Reported on 11/07/2018) 5 tablet 11   No facility-administered medications prior to visit.     No Known Allergies  ROS Review of Systems  Review of Systems  Constitutional: Negative for activity change, appetite change, chills and fever.  HENT: Negative for congestion, nosebleeds, trouble swallowing and voice change.   Respiratory: Negative for cough, shortness of breath and wheezing.   Gastrointestinal: Negative for diarrhea, nausea and vomiting.  Genitourinary: Negative for difficulty urinating, dysuria, flank pain and hematuria.  Musculoskeletal: Negative for back pain, joint swelling and neck pain.  Neurological: Negative for dizziness, speech difficulty, light-headedness and numbness.  See HPI. All other review of systems negative.     Objective:    Physical Exam  BP 122/79 (BP Location: Right Arm, Patient Position: Sitting, Cuff Size: Large)   Pulse 65   Temp 97.7 F (36.5 C) (Oral)   Resp 17   Ht 5\' 9"  (1.753 m)   Wt 228 lb 6.4 oz (103.6 kg)   SpO2 96%   BMI 33.73 kg/m  Wt Readings from Last 3 Encounters:  11/07/18 228 lb 6.4 oz (103.6 kg)  08/16/18 225 lb 12.8 oz (102.4 kg)  08/15/18 222 lb (100.7 kg)    Physical Exam  Constitutional: Oriented to person, place, and time. Appears well-developed and well-nourished.  HENT:  Head: Normocephalic and atraumatic.  Eyes: Conjunctivae and EOM are normal.  Cardiovascular: Normal rate, regular rhythm, normal heart sounds and intact distal pulses.  No murmur heard. Pulmonary/Chest: Effort normal and breath sounds normal. No stridor. No respiratory distress. Has no wheezes.  Neurological: Is alert and oriented to person, place, and time.  Skin: Skin is warm. Capillary refill takes less than 2 seconds.  Psychiatric: Has a normal mood and affect. Behavior is normal. Judgment and thought content normal.    Health Maintenance Due  Topic Date  Due  . HEMOGLOBIN A1C  11/03/2018    There are no preventive  care reminders to display for this patient.  Lab Results  Component Value Date   TSH 1.420 11/11/2017   Lab Results  Component Value Date   WBC 4.1 11/11/2017   HGB 12.6 (L) 11/11/2017   HCT 37.1 (L) 11/11/2017   MCV 88 11/11/2017   PLT 299 11/11/2017   Lab Results  Component Value Date   NA 139 05/04/2018   K 4.2 05/04/2018   CO2 22 05/04/2018   GLUCOSE 134 (H) 05/04/2018   BUN 9 05/04/2018   CREATININE 0.89 05/04/2018   BILITOT 0.4 05/04/2018   ALKPHOS 85 05/04/2018   AST 25 05/04/2018   ALT 30 05/04/2018   PROT 7.5 05/04/2018   ALBUMIN 4.9 (H) 05/04/2018   CALCIUM 10.0 05/04/2018   ANIONGAP 8 10/13/2016   GFR 88.28 08/21/2014   Lab Results  Component Value Date   CHOL 137 05/04/2018   Lab Results  Component Value Date   HDL 39 (L) 05/04/2018   Lab Results  Component Value Date   LDLCALC 58 05/04/2018   Lab Results  Component Value Date   TRIG 201 (H) 05/04/2018   Lab Results  Component Value Date   CHOLHDL 3.5 05/04/2018   Lab Results  Component Value Date   HGBA1C 6.9 (A) 11/07/2018      Assessment & Plan:   Problem List Items Addressed This Visit      Other   Hyperlipidemia   Relevant Orders   Lipid panel   Comprehensive metabolic panel  -  Discussed medications that affect lipids Reminded patient to avoid grapefruits Reviewed last 3 lipids Discussed current meds: statin, aspirin Advised dietary fiber and fish oil and ways to keep HDL high CAD prevention and reviewed side effects of statins     Other Visit Diagnoses    Diabetic polyneuropathy associated with type 2 diabetes mellitus ([place])    -  Primary   Well controlled type 2 diabetes mellitus with autonomic neuropathy ([place])      well controlled hemoglobin a1c is at goal Continue exercise Lipids monitored and renal function in range Continue current meds Reviewed diabetic foot care Emphasized importance of eye and dental exam      Relevant Orders   POCT glycosylated  hemoglobin (Hb A1C) (Completed)   Lipid panel   Pituitary adenoma ([place])    -  Follow up with Neurosurgery      No orders of the defined types were placed in this encounter.   Follow-up: Return in about 6 months (around 05/09/2019) for follow up on diabetes and cholesterol.    [name], MD

## 2018-11-07 NOTE — Patient Instructions (Signed)
° ° ° °  If you have lab work done today you will be contacted with your lab results within the next 2 weeks.  If you have not heard from us then please contact us. The fastest way to get your results is to register for My Chart. ° ° °IF you received an x-ray today, you will receive an invoice from [facility]. Please contact [facility] at [phone] with questions or concerns regarding your invoice.  ° °IF you received labwork today, you will receive an invoice from [facility]. Please contact [facility] at [phone] with questions or concerns regarding your invoice.  ° °Our billing staff will not be able to assist you with questions regarding bills from these companies. ° °You will be contacted with the lab results as soon as they are available. The fastest way to get your results is to activate your My Chart account. Instructions are located on the last page of this paperwork. If you have not heard from us regarding the results in 2 weeks, please contact this office. °  ° ° ° °

## 2018-11-08 LAB — COMPREHENSIVE METABOLIC PANEL
A/G RATIO: 2.1 (ref 1.2–2.2)
ALT: 36 IU/L (ref 0–44)
AST: 34 IU/L (ref 0–40)
Albumin: 5.3 g/dL — ABNORMAL HIGH (ref 3.6–4.8)
Alkaline Phosphatase: 81 IU/L (ref 39–117)
BILIRUBIN TOTAL: 0.6 mg/dL (ref 0.0–1.2)
BUN/Creatinine Ratio: 9 — ABNORMAL LOW (ref 10–24)
BUN: 10 mg/dL (ref 8–27)
CALCIUM: 10.4 mg/dL — AB (ref 8.6–10.2)
CO2: 21 mmol/L (ref 20–29)
Chloride: 95 mmol/L — ABNORMAL LOW (ref 96–106)
Creatinine, Ser: 1.07 mg/dL (ref 0.76–1.27)
GFR, EST AFRICAN AMERICAN: 86 mL/min/{1.73_m2} (ref >59)
GFR, EST NON AFRICAN AMERICAN: 74 mL/min/{1.73_m2} (ref >59)
GLOBULIN, TOTAL: 2.5 g/dL (ref 1.5–4.5)
Glucose: 152 mg/dL — ABNORMAL HIGH (ref 65–99)
POTASSIUM: 4.7 mmol/L (ref 3.5–5.2)
SODIUM: 135 mmol/L (ref 134–144)
TOTAL PROTEIN: 7.8 g/dL (ref 6.0–8.5)

## 2018-11-08 LAB — LIPID PANEL
CHOLESTEROL TOTAL: 152 mg/dL (ref 100–199)
Chol/HDL Ratio: 3.7 ratio (ref 0.0–5.0)
HDL: 41 mg/dL (ref >39)
LDL CALC: 83 mg/dL (ref 0–99)
TRIGLYCERIDES: 141 mg/dL (ref 0–149)
VLDL Cholesterol Cal: 28 mg/dL (ref 5–40)

## 2018-11-25 MED FILL — PANTOPRAZOLE SOD DR 40 MG T: 40 | 30 days supply | Qty: 30 | Fill #5

## 2018-12-08 MED FILL — TAMSULOSIN HCL 0.4 MG CAP: 0.4 | 30 days supply | Qty: 30 | Fill #4

## 2018-12-13 DIAGNOSIS — D352 Benign neoplasm of pituitary gland: Secondary | ICD-10-CM | POA: Insufficient documentation

## 2018-12-17 HISTORY — PX: TRANSPHENOIDAL / TRANSNASAL HYPOPHYSECTOMY / RESECTION PITUITARY TUMOR: SUR1382

## 2018-12-25 ENCOUNTER — Other Ambulatory Visit: Payer: Self-pay

## 2018-12-25 ENCOUNTER — Ambulatory Visit (HOSPITAL_COMMUNITY)
Admission: EM | Admit: 2018-12-25 | Discharge: 2018-12-25 | Disposition: A | Discharge status: Nursing Home (Includes ICF) | Payer: Medicare Other | Source: Home / Self Care

## 2018-12-25 ENCOUNTER — Encounter (HOSPITAL_COMMUNITY): Payer: Self-pay

## 2018-12-25 ENCOUNTER — Emergency Department (HOSPITAL_COMMUNITY)
Admission: EM | Admit: 2018-12-25 | Discharge: 2018-12-25 | Disposition: A | Discharge status: Home / Self Care | Payer: Medicare Other | Attending: Emergency Medicine | Admitting: Emergency Medicine

## 2018-12-25 DIAGNOSIS — E119 Type 2 diabetes mellitus without complications: Secondary | ICD-10-CM | POA: Insufficient documentation

## 2018-12-25 DIAGNOSIS — Z79899 Other long term (current) drug therapy: Secondary | ICD-10-CM | POA: Insufficient documentation

## 2018-12-25 DIAGNOSIS — F1410 Cocaine abuse, uncomplicated: Secondary | ICD-10-CM

## 2018-12-25 DIAGNOSIS — Z7982 Long term (current) use of aspirin: Secondary | ICD-10-CM | POA: Insufficient documentation

## 2018-12-25 DIAGNOSIS — F411 Generalized anxiety disorder: Secondary | ICD-10-CM

## 2018-12-25 DIAGNOSIS — Z87891 Personal history of nicotine dependence: Secondary | ICD-10-CM | POA: Insufficient documentation

## 2018-12-25 DIAGNOSIS — E7800 Pure hypercholesterolemia, unspecified: Secondary | ICD-10-CM | POA: Diagnosis not present

## 2018-12-25 DIAGNOSIS — I2510 Atherosclerotic heart disease of native coronary artery without angina pectoris: Secondary | ICD-10-CM | POA: Insufficient documentation

## 2018-12-25 DIAGNOSIS — I252 Old myocardial infarction: Secondary | ICD-10-CM | POA: Insufficient documentation

## 2018-12-25 DIAGNOSIS — Z7984 Long term (current) use of oral hypoglycemic drugs: Secondary | ICD-10-CM | POA: Diagnosis not present

## 2018-12-25 DIAGNOSIS — I10 Essential (primary) hypertension: Secondary | ICD-10-CM | POA: Insufficient documentation

## 2018-12-25 DIAGNOSIS — F419 Anxiety disorder, unspecified: Secondary | ICD-10-CM | POA: Insufficient documentation

## 2018-12-25 DIAGNOSIS — E785 Hyperlipidemia, unspecified: Secondary | ICD-10-CM | POA: Diagnosis not present

## 2018-12-25 LAB — RAPID URINE DRUG SCREEN, HOSP PERFORMED
Amphetamines: NOT DETECTED
BENZODIAZEPINES: NOT DETECTED
Barbiturates: NOT DETECTED
Cocaine: POSITIVE — AB
Opiates: NOT DETECTED
Tetrahydrocannabinol: NOT DETECTED

## 2018-12-25 LAB — CBC
HCT: 36.2 % — ABNORMAL LOW (ref 39.0–52.0)
Hemoglobin: 11.6 g/dL — ABNORMAL LOW (ref 13.0–17.0)
MCH: 29.4 pg (ref 26.0–34.0)
MCHC: 32 g/dL (ref 30.0–36.0)
MCV: 91.9 fL (ref 80.0–100.0)
Platelets: 279 10*3/uL (ref 150–400)
RBC: 3.94 MIL/uL — ABNORMAL LOW (ref 4.22–5.81)
RDW: 15.1 % (ref 11.5–15.5)
WBC: 5.3 10*3/uL (ref 4.0–10.5)
nRBC: 0 % (ref 0.0–0.2)

## 2018-12-25 LAB — COMPREHENSIVE METABOLIC PANEL
ALT: 46 U/L — ABNORMAL HIGH (ref 0–44)
AST: 50 U/L — ABNORMAL HIGH (ref 15–41)
Albumin: 4.4 g/dL (ref 3.5–5.0)
Alkaline Phosphatase: 61 U/L (ref 38–126)
Anion gap: 12 (ref 5–15)
BUN: 15 mg/dL (ref 8–23)
CO2: 26 mmol/L (ref 22–32)
Calcium: 9.9 mg/dL (ref 8.9–10.3)
Chloride: 98 mmol/L (ref 98–111)
Creatinine, Ser: 1.14 mg/dL (ref 0.61–1.24)
GFR calc Af Amer: >60 mL/min (ref >60)
Glucose, Bld: 135 mg/dL — ABNORMAL HIGH (ref 70–99)
Potassium: 3.9 mmol/L (ref 3.5–5.1)
Sodium: 136 mmol/L (ref 135–145)
TOTAL PROTEIN: 7.5 g/dL (ref 6.5–8.1)
Total Bilirubin: 0.8 mg/dL (ref 0.3–1.2)

## 2018-12-25 MED ORDER — LORAZEPAM 1 MG PO TABS
1.0000 mg | ORAL_TABLET | Freq: Once | ORAL | Status: AC
  Administered 2018-12-25: 1 mg via ORAL
  Filled 2018-12-25: qty 1

## 2018-12-25 NOTE — Discharge Instructions (Addendum)
It was our pleasure to provide your ER care today - we hope that you feel better.  Avoid using cocaine - cocaine use can cause heart attacks and strokes.   Follow up with primary care doctor in the coming week.  Return to ER if worse, new symptoms, chest pain, trouble breathing, other concern.   You were given medication in the ER to help your symptoms - that medication can cause drowsiness, no driving for the next 6 hours.

## 2018-12-25 NOTE — ED Provider Notes (Signed)
[facility] EMERGENCY DEPARTMENT Provider Note   CSN: [id number] Arrival date & time: 12/25/18  1313     History   Chief Complaint Chief Complaint  Patient presents with  . Drug Problem    HPI Steven Hale. is a 63 y.o. male.  Patient presents indicating since 'partying' 2 nights ago has felt 'funny'. States drank etoh and snorted a white substance then. Since then, feels generally jumpy/shaky/unsettled/anxious. Symptoms acute onset, moderate, persistent. Denies any other recent change in meds, or other ingestion. Denies daily etoh use. Denies headaches. No chest pain or discomfort. No sob or unusual doe. No abd pain or nvd. No dysuria or gu c/o. Having normal bms. Normal appetite. No skin changes, rash or lesions. No change in speech or vision. No numbness/weakness or loss of normal functional ability. Denies depression. No fever or chills. No wt loss or gain.   The history is provided by the patient.  Drug Problem  Pertinent negatives include no chest pain, no abdominal pain, no headaches and no shortness of breath.    Past Medical History:  Diagnosis Date  . Anemia, unspecified   . Bell's palsy   . Blood transfusion    during treatment for Ca  . Blood transfusion without reported diagnosis   . CAD (coronary artery disease)    a. s/p multiple PCIs;  b. s/p CABG in 09/2010 (LIMA-LAD, SVG-OM1, SVG-distal RCA/OM2);  Cardiac cath in 12/2015: Mild LAD disease, occluded mid LCX and proximal RCA (left to right collaterals), Occluded SVG to RCA and atretic LIMA. Patent SVG to OM  . Chronic back pain   . Diabetes mellitus without complication ([place])   . DJD (degenerative joint disease)    low back & all over   . GERD (gastroesophageal reflux disease)   . Helicobacter pylori (H. pylori) infection   . Hemorrhoids   . Hyperlipidemia   . Hypertension   . Impotence of organic origin   . Myocardial infarction ([place])    2005 and 2012   . PAD (peripheral artery  disease) ([place])    a. s/p bilat SFA stents;  b. ABIs 11/2012: R 0.99, L 0.86.  [name] Pancreatic cancer [facility])    surgery 2009  . Pre-diabetes 08/16/2015    Patient Active Problem List   Diagnosis Date Noted  . Acute gouty arthritis 08/15/2018  . Left foot pain 08/15/2018  . Uveitis 05/14/2017  . Eye pain, right 05/14/2017  . Blepharitis of eyelid of right eye 05/14/2017  . Coronary artery disease involving coronary bypass graft of native heart   . Pre-diabetes 08/16/2015  . Peripheral neuropathy 07/09/2015  . Other male erectile dysfunction 03/07/2015  . Essential hypertension, benign 03/07/2015  . Pure hypercholesterolemia 12/10/2014  . Essential hypertension 12/10/2014  . Low testosterone 12/10/2014  . S/P right UKR 11/19/2014  . Lumbar disc herniation with radiculopathy 10/15/2014  . Back pain 10/15/2014  . Acute back pain   . Chronic back pain 10/12/2014  . H/O pancreatic cancer 2009 10/12/2014  . Iron deficiency anemia 06/11/2014  . Testosterone deficiency 09/07/2013  . Benign paroxysmal positional vertigo 08/30/2013  . PAD (peripheral artery disease) ([place]) 06/02/2012  . Precordial pain 05/14/2011  . Claudication ([place]) 05/14/2011  . BLOOD IN STOOL 09/20/2009  . PVD (peripheral vascular disease) ([place]) 01/08/2009  . BELL'S PALSY, RIGHT 12/25/2008  . Hyperlipidemia 06/05/2008  . CAD- multiple PCIs, CABG 2011, low risk Myoview 05/2012 11/17/2003    Past Surgical History:  Procedure Laterality Date  .  ABDOMINAL AORTAGRAM N/A 09/13/2013   Procedure: ABDOMINAL Maxcine Ham;  Surgeon: Wellington Hampshire, MD;  Location: [facility] CATH LAB;  Service: Cardiovascular;  Laterality: N/A;  . ABDOMINAL AORTAGRAM N/A 08/29/2014   Procedure: ABDOMINAL Maxcine Ham;  Surgeon: Wellington Hampshire, MD;  Location: [facility] CATH LAB;  Service: Cardiovascular;  Laterality: N/A;  . BACK SURGERY     1992  . CARDIAC CATHETERIZATION N/A 12/25/2015   Procedure: Left Heart Cath and Cors/Grafts Angiography;  Surgeon: Wellington Hampshire, MD;  Location: [facility] CV LAB;  Service: Cardiovascular;  Laterality: N/A;  . CORONARY ARTERY BYPASS GRAFT  nov 2011   x 4  . ESOPHAGOGASTRODUODENOSCOPY (EGD) WITH PROPOFOL N/A 04/02/2017   Procedure: ESOPHAGOGASTRODUODENOSCOPY (EGD) WITH PROPOFOL;  Surgeon: Carol Ada, MD;  Location: [facility] ENDOS[facility];  Service: Endoscopy;  Laterality: N/A;  . HEMORRHOID SURGERY  10/30/2011   Procedure: HEMORRHOIDECTOMY;  Surgeon: Harl Bowie, MD;  Location: [facility];  Service: General;  Laterality: N/A;  . JOINT REPLACEMENT    . pancreatic  cancer  05/10/2008   Resection of distal panrease and spleen  . PARTIAL KNEE ARTHROPLASTY Right 11/19/2014   Procedure: RIGHT UNI-KNEE ARTHROPLASTY MEDIALLY;  Surgeon: Mauri Pole, MD;  Location: [facility] ORS;  Service: Orthopedics;  Laterality: Right;  . PERIPHERAL VASCULAR CATHETERIZATION  Oct 2014   Lt SFA PTA  . PERIPHERAL VASCULAR CATHETERIZATION  Oct 2015   ISR Lt SFA-PTA  . right partial knee replacement    . splenectomy    . THROMBECTOMY FEMORAL ARTERY Left 09/13/2013   Procedure: THROMBECTOMY FEMORAL ARTERY;  Surgeon: Wellington Hampshire, MD;  Location: [facility] CATH LAB;  Service: Cardiovascular;  Laterality: Left;        Home Medications    Prior to Admission medications   Medication Sig Start Date End Date Taking? Authorizing Provider  aspirin EC 81 MG tablet Take 1 tablet (81 mg total) by mouth daily. 11/19/14   Danae Orleans, PA-C  atorvastatin (LIPITOR) 40 MG tablet Take 1 tablet (40 mg total) by mouth daily. Patient taking differently: Take 40 mg by mouth at bedtime.  07/07/16   Wellington Hampshire, MD  fish oil-omega-3 fatty acids 1000 MG capsule Take 1 capsule (1 g total) by mouth daily. 11/30/12   Rosana Hoes, MD  HYDROcodone-acetaminophen (NORCO) 5-325 MG tablet Take 1 tablet by mouth every 6 (six) hours as needed for moderate pain. Patient not taking: Reported on 11/07/2018 08/15/18   Horald Pollen, MD  lisinopril (PRINIVIL,ZESTRIL) 5 MG  tablet TAKE 0.5 TABLETS BY MOUTH  DAILY. 09/22/16   Lelon Perla, MD  metFORMIN (GLUCOPHAGE) 500 MG tablet Take 1 tablet (500 mg total) by mouth daily with breakfast. 12/15/17   [name], MD  metoprolol tartrate (LOPRESSOR) 25 MG tablet Take 1 tablet by mouth two  times daily 09/18/16   Lelon Perla, MD  Multiple Vitamin (MULTIVITAMIN) tablet Take 1 tablet by mouth daily. 11/30/12   Rosana Hoes, MD  pantoprazole (PROTONIX) 40 MG tablet TAKE 1 TABLET BY MOUTH ONCE DAILY 05/09/18   Wellington Hampshire, MD  pregabalin (LYRICA) 150 MG capsule Take 1 capsule (150 mg total) by mouth daily. 05/02/18   [name], MD  sildenafil (VIAGRA) 100 MG tablet Take 0.5-1 tablets (50-100 mg total) by mouth daily as needed for erectile dysfunction. Patient not taking: Reported on 11/07/2018 08/25/18   Horald Pollen, MD  tamsulosin (FLOMAX) 0.4 MG CAPS capsule TAKE 1 CAPSULE BY MOUTH DAILY. 07/13/18   Nolon Rod,  Arlie Solomons, MD    Family History Family History  Problem Relation Age of Onset  . Heart attack Father        died of MI at age 34  . Cancer Father   . Anesthesia problems Neg Hx   . Hypotension Neg Hx   . Malignant hyperthermia Neg Hx   . Pseudochol deficiency Neg Hx   . Colon cancer Neg Hx   . Esophageal cancer Neg Hx   . Prostate cancer Neg Hx   . Rectal cancer Neg Hx   . Stomach cancer Neg Hx     Social History Social History   Tobacco Use  . Smoking status: Former Smoker    Types: Cigarettes    Last attempt to quit: 11/29/2008    Years since quitting: 10.0  . Smokeless tobacco: Never Used  Substance Use Topics  . Alcohol use: Yes    Alcohol/week: 3.0 standard drinks    Types: 3 Shots of liquor per week    Comment: 2 drinks of brandy every week   . Drug use: No     Allergies   Patient has no known allergies.   Review of Systems Review of Systems  Constitutional: Negative for chills and fever.  HENT: Negative for sore throat.   Eyes: Negative for redness  and visual disturbance.  Respiratory: Negative for cough and shortness of breath.   Cardiovascular: Negative for chest pain.  Gastrointestinal: Negative for abdominal pain, diarrhea and vomiting.  Endocrine: Negative for polyuria.  Genitourinary: Negative for flank pain.  Musculoskeletal: Negative for back pain and neck pain.  Skin: Negative for rash.  Neurological: Negative for headaches.  Hematological: Does not bruise/bleed easily.  Psychiatric/Behavioral: The patient is nervous/anxious.      Physical Exam Updated Vital Signs BP 121/64   Pulse (!) 57   Temp 98.1 F (36.7 C) (Oral)   Resp 14   SpO2 96%   Physical Exam Vitals signs and nursing note reviewed.  Constitutional:      Appearance: Normal appearance. He is well-developed.  HENT:     Head: Atraumatic.     Nose: Nose normal.     Mouth/Throat:     Mouth: Mucous membranes are moist.     Pharynx: Oropharynx is clear.  Eyes:     General: No scleral icterus.    Conjunctiva/sclera: Conjunctivae normal.     Pupils: Pupils are equal, round, and reactive to light.  Neck:     Musculoskeletal: Normal range of motion and neck supple. No neck rigidity or muscular tenderness.     Trachea: No tracheal deviation.     Comments: No thyromegaly.  Cardiovascular:     Rate and Rhythm: Normal rate and regular rhythm.     Pulses: Normal pulses.     Heart sounds: Normal heart sounds. No murmur. No friction rub. No gallop.   Pulmonary:     Effort: Pulmonary effort is normal. No accessory muscle usage or respiratory distress.     Breath sounds: Normal breath sounds.  Abdominal:     General: Bowel sounds are normal. There is no distension.     Palpations: Abdomen is soft.     Tenderness: There is no abdominal tenderness. There is no guarding.  Genitourinary:    Comments: No cva tenderness. Musculoskeletal:        General: No swelling or tenderness.     Right lower leg: No edema.     Left lower leg: No edema.     Comments:  CTLS  spine, non tender, aligned, no step off. Good rom bil extremities, no pain, no swelling. Distal pulses palp bil.   Lymphadenopathy:     Cervical: No cervical adenopathy.  Skin:    General: Skin is warm and dry.     Findings: No rash.  Neurological:     Mental Status: He is alert.     Comments: Alert, speech clear/fluent. No tremor or shakes. Motor/sens grossly intact bil. Steady gait.   Psychiatric:     Comments: Anxious appearing.       ED Treatments / Results  Labs (all labs ordered are listed, but only abnormal results are displayed) Results for orders placed or performed during the hospital encounter of 12/25/18  CBC  Result Value Ref Range   WBC 5.3 4.0 - 10.5 K/uL   RBC 3.94 (L) 4.22 - 5.81 MIL/uL   Hemoglobin 11.6 (L) 13.0 - 17.0 g/dL   HCT 36.2 (L) 39.0 - 52.0 %   MCV 91.9 80.0 - 100.0 fL   MCH 29.4 26.0 - 34.0 pg   MCHC 32.0 30.0 - 36.0 g/dL   RDW 15.1 11.5 - 15.5 %   Platelets 279 150 - 400 K/uL   nRBC 0.0 0.0 - 0.2 %  Comprehensive metabolic panel  Result Value Ref Range   Sodium 136 135 - 145 mmol/L   Potassium 3.9 3.5 - 5.1 mmol/L   Chloride 98 98 - 111 mmol/L   CO2 26 22 - 32 mmol/L   Glucose, Bld 135 (H) 70 - 99 mg/dL   BUN 15 8 - 23 mg/dL   Creatinine, Ser 1.14 0.61 - 1.24 mg/dL   Calcium 9.9 8.9 - 10.3 mg/dL   Total Protein 7.5 6.5 - 8.1 g/dL   Albumin 4.4 3.5 - 5.0 g/dL   AST 50 (H) 15 - 41 U/L   ALT 46 (H) 0 - 44 U/L   Alkaline Phosphatase 61 38 - 126 U/L   Total Bilirubin 0.8 0.3 - 1.2 mg/dL   GFR calc non Af Amer >60 >60 mL/min   GFR calc Af Amer >60 >60 mL/min   Anion gap 12 5 - 15  Rapid urine drug screen (hospital performed)  Result Value Ref Range   Opiates NONE DETECTED NONE DETECTED   Cocaine POSITIVE (A) NONE DETECTED   Benzodiazepines NONE DETECTED NONE DETECTED   Amphetamines NONE DETECTED NONE DETECTED   Tetrahydrocannabinol NONE DETECTED NONE DETECTED   Barbiturates NONE DETECTED NONE DETECTED    EKG None  Radiology No  results found.  Procedures Procedures (including critical care time)  Medications Ordered in ED Medications  LORazepam (ATIVAN) tablet 1 mg (1 mg Oral Given 12/25/18 1550)     Initial Impression / Assessment and Plan / ED Course  I have reviewed the triage vital signs and the nursing notes.  Pertinent labs & imaging results that were available during my care of the patient were reviewed by me and considered in my medical decision making (see chart for details).  Labs sent.  Ativan 1 mg po.  Reviewed nursing notes and prior charts for additional history.   Recheck pt, calm and alert. No tremor or shakes. Less anxious. Vitals normal. No pain or discomfort, no cp.   Patient currently appears stable for d/c.     Final Clinical Impressions(s) / ED Diagnoses   Final diagnoses:  None    ED Discharge Orders    None       Lajean Saver, MD 12/25/18 [phone]

## 2018-12-25 NOTE — ED Notes (Signed)
Patient deferred to [facility] ED for probable drug OD per T. Rozanna Box FNP

## 2018-12-25 NOTE — ED Triage Notes (Signed)
Pt repots snorting a white substance Friday night, states he thought it was cocaine but ever since he has felt "jumpy" Denies any other symptoms. Pt a.o, nad noted.

## 2018-12-27 MED FILL — PANTOPRAZOLE SOD DR 40 MG T: 40 | 30 days supply | Qty: 30 | Fill #6

## 2019-01-02 DIAGNOSIS — F1410 Cocaine abuse, uncomplicated: Secondary | ICD-10-CM | POA: Insufficient documentation

## 2019-01-04 ENCOUNTER — Telehealth: Payer: Self-pay | Admitting: Family Medicine

## 2019-01-04 ENCOUNTER — Encounter: Payer: Self-pay | Admitting: Family Medicine

## 2019-01-04 DIAGNOSIS — E039 Hypothyroidism, unspecified: Secondary | ICD-10-CM | POA: Insufficient documentation

## 2019-01-04 NOTE — Telephone Encounter (Signed)
mychart message sent to pt about rescheduling their apt on 05/01/19 due to provider being out of the office

## 2019-01-12 MED FILL — TAMSULOSIN HCL 0.4 MG CAP: 0.4 | 30 days supply | Qty: 30 | Fill #5

## 2019-02-02 MED FILL — PANTOPRAZOLE SOD DR 40 MG T: 40 | 30 days supply | Qty: 30 | Fill #7

## 2019-02-17 MED FILL — TAMSULOSIN HCL 0.4 MG CAP: 0.4 | 30 days supply | Qty: 30 | Fill #6

## 2019-03-02 MED FILL — PANTOPRAZOLE SOD DR 40 MG T: 40 | 30 days supply | Qty: 30 | Fill #8

## 2019-03-20 ENCOUNTER — Other Ambulatory Visit: Payer: Self-pay | Admitting: Cardiovascular Disease

## 2019-03-20 DIAGNOSIS — I739 Peripheral vascular disease, unspecified: Secondary | ICD-10-CM

## 2019-03-21 ENCOUNTER — Ambulatory Visit
Admission: RE | Admit: 2019-03-21 | Discharge: 2019-03-21 | Disposition: A | Discharge status: Home / Self Care | Payer: Medicare Other | Source: Ambulatory Visit | Attending: Family Medicine | Admitting: Family Medicine

## 2019-03-21 ENCOUNTER — Telehealth (INDEPENDENT_AMBULATORY_CARE_PROVIDER_SITE_OTHER): Payer: Medicare Other | Admitting: Family Medicine

## 2019-03-21 ENCOUNTER — Other Ambulatory Visit: Payer: Self-pay

## 2019-03-21 VITALS — BP 129/82 | HR 70 | Temp 98.3°F | Resp 17 | Ht 69.0 in | Wt 217.0 lb

## 2019-03-21 DIAGNOSIS — M25432 Effusion, left wrist: Secondary | ICD-10-CM | POA: Diagnosis not present

## 2019-03-21 DIAGNOSIS — R358 Other polyuria: Secondary | ICD-10-CM

## 2019-03-21 DIAGNOSIS — E1165 Type 2 diabetes mellitus with hyperglycemia: Secondary | ICD-10-CM

## 2019-03-21 DIAGNOSIS — M25532 Pain in left wrist: Secondary | ICD-10-CM

## 2019-03-21 DIAGNOSIS — R3589 Other polyuria: Secondary | ICD-10-CM

## 2019-03-21 LAB — POCT GLYCOSYLATED HEMOGLOBIN (HGB A1C): Hemoglobin A1C: 6.6 % — AB (ref 4.0–5.6)

## 2019-03-21 LAB — POCT URINALYSIS DIP (MANUAL ENTRY)
Bilirubin, UA: NEGATIVE
Blood, UA: NEGATIVE
Glucose, UA: NEGATIVE mg/dL
Ketones, POC UA: NEGATIVE mg/dL
Leukocytes, UA: NEGATIVE
Nitrite, UA: NEGATIVE
Protein Ur, POC: NEGATIVE mg/dL
Spec Grav, UA: 1.01 (ref 1.010–1.025)
Urobilinogen, UA: 0.2 E.U./dL
pH, UA: 6.5 (ref 5.0–8.0)

## 2019-03-21 MED ORDER — INDOMETHACIN 25 MG PO CAPS: 25.0000 mg | ORAL_CAPSULE | Freq: Two times a day (BID) | ORAL | 1 refills | Status: DC

## 2019-03-21 MED FILL — INDOMETHACIN 25 MG CAPSULE: 25 | 30 days supply | Qty: 60 | Fill #0

## 2019-03-21 NOTE — Progress Notes (Signed)
Visit was initially a phone visit and converted to an in-person office visit.  I spent a total of TIME; 0 MIN TO 60 MIN: 25 minutes talking with the patient or their proxy.  CC: left wrist pain  Subjective   Steven Hale. is a 63 y.o. established patient. Telephone visit today for  HPI   Left wrist pain Patient reports that he has been having pain and swelling in the left wrist since Surgery in February 2020 He states that the pain is there with movement  Diabetes and Polyuria Patient reports that he thinks his sugars might be high He reports that he has difficulty with increased hunger and urination He is still exercising but the gyms are closed  He is not eating sweats and starches and is sticking to a diabetic diet He is still on metformin and is compliant with his medication Lab Results  Component Value Date   HGBA1C 6.6 (A) 03/21/2019      Patient Active Problem List   Diagnosis Date Noted  . Acute gouty arthritis 08/15/2018  . Left foot pain 08/15/2018  . Uveitis 05/14/2017  . Eye pain, right 05/14/2017  . Blepharitis of eyelid of right eye 05/14/2017  . Coronary artery disease involving coronary bypass graft of native heart   . Pre-diabetes 08/16/2015  . Peripheral neuropathy 07/09/2015  . Other male erectile dysfunction 03/07/2015  . Essential hypertension, benign 03/07/2015  . Pure hypercholesterolemia 12/10/2014  . Essential hypertension 12/10/2014  . Low testosterone 12/10/2014  . S/P right UKR 11/19/2014  . Lumbar disc herniation with radiculopathy 10/15/2014  . Back pain 10/15/2014  . Acute back pain   . Chronic back pain 10/12/2014  . H/O pancreatic cancer 2009 10/12/2014  . Iron deficiency anemia 06/11/2014  . Testosterone deficiency 09/07/2013  . Benign paroxysmal positional vertigo 08/30/2013  . PAD (peripheral artery disease) ([place]) 06/02/2012  . Precordial pain 05/14/2011  . Claudication ([place]) 05/14/2011  . BLOOD IN STOOL 09/20/2009   . PVD (peripheral vascular disease) ([place]) 01/08/2009  . BELL'S PALSY, RIGHT 12/25/2008  . Hyperlipidemia 06/05/2008  . CAD- multiple PCIs, CABG 2011, low risk Myoview 05/2012 11/17/2003    Past Medical History:  Diagnosis Date  . Anemia, unspecified   . Bell's palsy   . Blood transfusion    during treatment for Ca  . Blood transfusion without reported diagnosis   . CAD (coronary artery disease)    a. s/p multiple PCIs;  b. s/p CABG in 09/2010 (LIMA-LAD, SVG-OM1, SVG-distal RCA/OM2);  Cardiac cath in 12/2015: Mild LAD disease, occluded mid LCX and proximal RCA (left to right collaterals), Occluded SVG to RCA and atretic LIMA. Patent SVG to OM  . Chronic back pain   . Diabetes mellitus without complication ([place])   . DJD (degenerative joint disease)    low back & all over   . GERD (gastroesophageal reflux disease)   . Helicobacter pylori (H. pylori) infection   . Hemorrhoids   . Hyperlipidemia   . Hypertension   . Impotence of organic origin   . Myocardial infarction ([place])    2005 and 2012   . PAD (peripheral artery disease) ([place])    a. s/p bilat SFA stents;  b. ABIs 11/2012: R 0.99, L 0.86.  [name] Pancreatic cancer [facility])    surgery 2009  . Pre-diabetes 08/16/2015    Current Outpatient Medications  Medication Sig Dispense Refill  . aspirin EC 81 MG tablet Take 1 tablet (81 mg total) by mouth daily.    [name]  atorvastatin (LIPITOR) 40 MG tablet Take 1 tablet (40 mg total) by mouth daily. (Patient taking differently: Take 40 mg by mouth at bedtime. ) 90 tablet 3  . fish oil-omega-3 fatty acids 1000 MG capsule Take 1 capsule (1 g total) by mouth daily. 60 capsule 1  . lisinopril (PRINIVIL,ZESTRIL) 5 MG tablet TAKE 0.5 TABLETS BY MOUTH  DAILY. 15 tablet 0  . metFORMIN (GLUCOPHAGE) 500 MG tablet Take 1 tablet (500 mg total) by mouth daily with breakfast. 90 tablet 3  . metoprolol tartrate (LOPRESSOR) 25 MG tablet Take 1 tablet by mouth two  times daily 60 tablet 0  . Multiple Vitamin  (MULTIVITAMIN) tablet Take 1 tablet by mouth daily. 30 tablet 10  . pantoprazole (PROTONIX) 40 MG tablet TAKE 1 TABLET BY MOUTH ONCE DAILY 30 tablet 10  . pregabalin (LYRICA) 150 MG capsule Take 1 capsule (150 mg total) by mouth daily. 30 capsule 0  . sildenafil (VIAGRA) 100 MG tablet Take 0.5-1 tablets (50-100 mg total) by mouth daily as needed for erectile dysfunction. 5 tablet 11  . colchicine 0.6 MG tablet Take 1 tablet (0.6 mg total) by mouth 2 (two) times daily. 60 tablet 3  . HYDROcodone-acetaminophen (NORCO) 5-325 MG tablet Take 1 tablet by mouth every 6 (six) hours as needed for moderate pain. (Patient not taking: Reported on 11/07/2018) 10 tablet 0  . indomethacin (INDOCIN) 25 MG capsule Take 1 capsule (25 mg total) by mouth 2 (two) times daily with a meal. 60 capsule 1  . tamsulosin (FLOMAX) 0.4 MG CAPS capsule TAKE 1 CAPSULE BY MOUTH DAILY. 60 capsule 0   No current facility-administered medications for this visit.     No Known Allergies  Social History   Socioeconomic History  . Marital status: Single    Spouse name: Not on file  . Number of children: 0  . Years of education: Not on file  . Highest education level: Not on file  Occupational History  . Occupation: TEFL teacher: UNEMPLOYED  Social Needs  . Financial resource strain: Not on file  . Food insecurity:    Worry: Not on file    Inability: Not on file  . Transportation needs:    Medical: Not on file    Non-medical: Not on file  Tobacco Use  . Smoking status: Former Smoker    Types: Cigarettes    Last attempt to quit: 11/29/2008    Years since quitting: 10.3  . Smokeless tobacco: Never Used  Substance and Sexual Activity  . Alcohol use: Yes    Alcohol/week: 3.0 standard drinks    Types: 3 Shots of liquor per week    Comment: 2 drinks of brandy every week   . Drug use: No  . Sexual activity: Not Currently  Lifestyle  . Physical activity:    Days per week: Not on file    Minutes per session:  Not on file  . Stress: Not on file  Relationships  . Social connections:    Talks on phone: Not on file    Gets together: Not on file    Attends religious service: Not on file    Active member of club or organization: Not on file    Attends meetings of clubs or organizations: Not on file    Relationship status: Not on file  . Intimate partner violence:    Fear of current or ex partner: Not on file    Emotionally abused: Not on file  Physically abused: Not on file    Forced sexual activity: Not on file  Other Topics Concern  . 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  HENT: Negative for congestion, nosebleeds, trouble swallowing and voice change.   Respiratory: Negative for cough, shortness of breath and wheezing.   Gastrointestinal: Negative for diarrhea, nausea and vomiting.  Genitourinary: Negative for difficulty urinating, dysuria, flank pain and hematuria. See hpi Musculoskeletal: Negative for back pain and neck pain.  Neurological: Negative for dizziness, speech difficulty, light-headedness and numbness.  See HPI. All other review of systems negative.   Objective   Vitals as reported by the patient: Today's Vitals   03/21/19 0823  BP: 129/82  Pulse: 70  Resp: 17  Temp: 98.3 F (36.8 C)  TempSrc: Oral  SpO2: 96%  Weight: 217 lb (98.4 kg)  Height: _0  (1.753 m)   Physical Exam  Constitutional: Oriented to person, place, and time. Appears well-developed and well-nourished.  HENT:  Head: Normocephalic and atraumatic.  Eyes: Conjunctivae and EOM are normal.  Cardiovascular: Normal rate, regular rhythm, normal heart sounds and intact distal pulses.  No murmur heard. Pulmonary/Chest: Effort normal and breath sounds normal. No stridor. No respiratory distress.  Has no wheezes.  Neurological: Is alert and oriented to person, place, and time.  Skin: Skin is warm. Capillary refill takes less than 2 seconds.  Psychiatric: Has a normal mood and affect. Behavior is normal. Judgment and thought content normal.   Left thumb with tenderness and erythema at the thenar compartment Mild tenderness at the wrist Tenderness noted at the MCP of the first digit   CLINICAL DATA:  Left wrist pain for several months without known injury.  EXAM: LEFT WRIST - COMPLETE 3+ VIEW  COMPARISON:  None.  FINDINGS: There is no evidence of fracture or dislocation. There is no evidence of arthropathy or other focal bone abnormality. Soft tissues are unremarkable.  IMPRESSION: Negative.   Electronically Signed   By: Marijo Conception M.D.   On: 03/21/2019 09:27  Diagnoses and all orders for this visit:  Left wrist pain- no fracture on xray, no osteoarthritis -     DG Wrist Complete Left; Future -     CMP14+EGFR -     CBC with Differential/Platelet -     Uric Acid  Pain and swelling of left wrist- most likely gout, discussed that he should limit certain foods, reviewed xray -     DG Wrist Complete Left; Future -     CMP14+EGFR -     CBC with Differential/Platelet -     Uric Acid  Type 2 diabetes mellitus with hyperglycemia, without long-term current use of insulin (HCC)- continue diabetes regimen -     POCT glycosylated hemoglobin (Hb A1C) -     POCT urinalysis dipstick  Polyuria- no UTI, diabetes is still in a good range  -     POCT urinalysis dipstick  Other orders -     indomethacin (INDOCIN) 25 MG capsule; Take 1 capsule (25 mg total) by mouth 2 (two) times daily with a meal.      I provided 25 minutes of face-to-face time during this encounter.  [name], MD  Primary Care at [facility]

## 2019-03-21 NOTE — Patient Instructions (Addendum)
  LEFT WRIST - COMPLETE 3+ VIEW  COMPARISON:  None.  FINDINGS: There is no evidence of fracture or dislocation. There is no evidence of arthropathy or other focal bone abnormality. Soft tissues are unremarkable.  IMPRESSION: Negative.   Electronically Signed   By: Marijo Conception M.D.   On: 03/21/2019 09:27  If you have lab work done today you will be contacted with your lab results within the next 2 weeks.  If you have not heard from [place] then please contact us. The fastest way to get your results is to register for My Chart.   IF you received an x-ray today, you will receive an invoice from [facility]. Please contact [facility] at [phone] with questions or concerns regarding your invoice.   IF you received labwork today, you will receive an invoice from [facility]. Please contact [facility] at [phone] with questions or concerns regarding your invoice.   Our billing staff will not be able to assist you with questions regarding bills from these companies.  You will be contacted with the lab results as soon as they are available. The fastest way to get your results is to activate your My Chart account. Instructions are located on the last page of this paperwork. If you have not heard from [place] regarding the results in 2 weeks, please contact this office.

## 2019-03-21 NOTE — Progress Notes (Signed)
CC: Left wrist swelling since surgery per pt he thinks he may have hurt it during surgery and is unsure how.  Pt is taking ibuprofen with no relief.  Always painful and sore, pain level 8/10.  No travel outside the [place] or [place] in past 3 weeks.

## 2019-03-22 ENCOUNTER — Other Ambulatory Visit: Payer: Self-pay | Admitting: Family Medicine

## 2019-03-22 LAB — CBC WITH DIFFERENTIAL/PLATELET
Basophils Absolute: 0 10*3/uL (ref 0.0–0.2)
Basos: 1 %
EOS (ABSOLUTE): 0.2 10*3/uL (ref 0.0–0.4)
Eos: 4 %
Hematocrit: 36.2 % — ABNORMAL LOW (ref 37.5–51.0)
Hemoglobin: 12.3 g/dL — ABNORMAL LOW (ref 13.0–17.7)
Immature Grans (Abs): 0 10*3/uL (ref 0.0–0.1)
Immature Granulocytes: 0 %
Lymphocytes Absolute: 2.9 10*3/uL (ref 0.7–3.1)
Lymphs: 62 %
MCH: 29.6 pg (ref 26.6–33.0)
MCHC: 34 g/dL (ref 31.5–35.7)
MCV: 87 fL (ref 79–97)
Monocytes Absolute: 0.5 10*3/uL (ref 0.1–0.9)
Monocytes: 10 %
Neutrophils Absolute: 1 10*3/uL — ABNORMAL LOW (ref 1.4–7.0)
Neutrophils: 23 %
Platelets: 346 10*3/uL (ref 150–450)
RBC: 4.15 x10E6/uL (ref 4.14–5.80)
RDW: 13.4 % (ref 11.6–15.4)
WBC: 4.6 10*3/uL (ref 3.4–10.8)

## 2019-03-22 LAB — CMP14+EGFR
ALT: 18 IU/L (ref 0–44)
AST: 23 IU/L (ref 0–40)
Albumin/Globulin Ratio: 1.8 (ref 1.2–2.2)
Albumin: 4.9 g/dL — ABNORMAL HIGH (ref 3.8–4.8)
Alkaline Phosphatase: 77 IU/L (ref 39–117)
BUN/Creatinine Ratio: 9 — ABNORMAL LOW (ref 10–24)
BUN: 8 mg/dL (ref 8–27)
Bilirubin Total: 0.4 mg/dL (ref 0.0–1.2)
CO2: 21 mmol/L (ref 20–29)
Calcium: 10.6 mg/dL — ABNORMAL HIGH (ref 8.6–10.2)
Chloride: 100 mmol/L (ref 96–106)
Creatinine, Ser: 0.94 mg/dL (ref 0.76–1.27)
GFR calc Af Amer: 99 mL/min/{1.73_m2} (ref >59)
GFR calc non Af Amer: 86 mL/min/{1.73_m2} (ref >59)
Globulin, Total: 2.8 g/dL (ref 1.5–4.5)
Glucose: 136 mg/dL — ABNORMAL HIGH (ref 65–99)
Potassium: 4.7 mmol/L (ref 3.5–5.2)
Sodium: 139 mmol/L (ref 134–144)
Total Protein: 7.7 g/dL (ref 6.0–8.5)

## 2019-03-22 LAB — URIC ACID: Uric Acid: 9.3 mg/dL — ABNORMAL HIGH (ref 3.7–8.6)

## 2019-03-22 MED ORDER — COLCHICINE 0.6 MG PO TABS: 0.6000 mg | ORAL_TABLET | Freq: Two times a day (BID) | ORAL | 3 refills | Status: DC

## 2019-03-22 MED FILL — COLCHICINE 0.6 MG TABS: 0.6 | 30 days supply | Qty: 60 | Fill #0

## 2019-03-24 ENCOUNTER — Other Ambulatory Visit: Payer: Self-pay | Admitting: Family Medicine

## 2019-03-24 MED FILL — TAMSULOSIN HCL 0.4 MG CAP: 0.4 | 60 days supply | Qty: 60 | Fill #0

## 2019-03-24 NOTE — Progress Notes (Signed)
Spoke with pt, he did receive the pain meds, did not get the colchicine. Medication shows that it was sent on 5/6. Pt is calling his pharmacy to make sure the medication is there

## 2019-03-27 NOTE — Progress Notes (Signed)
Lm for call bk about result of lab

## 2019-03-28 ENCOUNTER — Telehealth: Payer: Self-pay | Admitting: Family Medicine

## 2019-03-28 NOTE — Telephone Encounter (Signed)
Copied from [place] [phone]. Topic: General - Call Back - No Documentation >> Mar 28, 2019 12:43 PM Richardo Priest, NT wrote: Reason for CRM: Patient is calling in regards to a missed called for his labs? Call back is [phone].

## 2019-03-28 NOTE — Telephone Encounter (Signed)
Called and spoke with pt about his labs.

## 2019-04-05 MED FILL — PANTOPRAZOLE SOD DR 40 MG T: 40 | 30 days supply | Qty: 30 | Fill #9

## 2019-04-24 ENCOUNTER — Telehealth: Payer: Self-pay | Admitting: *Deleted

## 2019-04-24 NOTE — Telephone Encounter (Signed)
Message left,re: follow up visit. 

## 2019-05-01 ENCOUNTER — Ambulatory Visit: Payer: Medicare Other | Admitting: Family Medicine

## 2019-05-06 MED FILL — PANTOPRAZOLE SOD DR 40 MG T: 40 | 30 days supply | Qty: 30 | Fill #10

## 2019-05-25 ENCOUNTER — Other Ambulatory Visit: Payer: Self-pay | Admitting: Family Medicine

## 2019-05-25 MED FILL — TAMSULOSIN HCL 0.4 MG CAP: 0.4 | 60 days supply | Qty: 60 | Fill #0

## 2019-05-31 ENCOUNTER — Telehealth: Payer: Self-pay | Admitting: *Deleted

## 2019-05-31 NOTE — Telephone Encounter (Signed)
A message was left, re: follow up visit. 

## 2019-06-01 ENCOUNTER — Other Ambulatory Visit: Payer: Self-pay | Admitting: Cardiovascular Disease

## 2019-06-01 MED FILL — PANTOPRAZOLE SOD DR 40 MG T: 40 | 30 days supply | Qty: 30 | Fill #0

## 2019-06-01 NOTE — Telephone Encounter (Signed)
Refill Request.  

## 2019-06-02 ENCOUNTER — Telehealth: Payer: Self-pay | Admitting: Cardiology

## 2019-06-02 NOTE — Telephone Encounter (Signed)
I called pt to confirm his appt for 06-05-19.         COVID-19 Pre-Screening Questions:   In the past 7 to 10 days have you had a cough,  shortness of breath, headache, congestion, fever (100 or greater) body aches, chills, sore throat, or sudden loss of taste or sense of smell? no  Have you been around anyone with known Covid 19.  Have you been around anyone who is awaiting Covid 19 test results in the past 7 to 10 days? no  Have you been around anyone who has been exposed to Covid 19, or has mentioned symptoms of Covid 19 within the past 7 to 10 days? no  If you have any concerns/questions about symptoms patients report during screening (either on the phone or at threshold). Contact the provider seeing the patient or DOD for further guidance.  If neither are available contact a member of the leadership team.

## 2019-06-05 ENCOUNTER — Other Ambulatory Visit: Payer: Self-pay

## 2019-06-05 ENCOUNTER — Encounter: Payer: Self-pay | Admitting: Cardiology

## 2019-06-05 ENCOUNTER — Ambulatory Visit (INDEPENDENT_AMBULATORY_CARE_PROVIDER_SITE_OTHER): Payer: Medicare Other | Admitting: Cardiology

## 2019-06-05 VITALS — BP 117/74 | HR 54 | Temp 97.2°F | Ht 69.0 in | Wt 228.0 lb

## 2019-06-05 DIAGNOSIS — I739 Peripheral vascular disease, unspecified: Secondary | ICD-10-CM

## 2019-06-05 DIAGNOSIS — E785 Hyperlipidemia, unspecified: Secondary | ICD-10-CM | POA: Diagnosis not present

## 2019-06-05 DIAGNOSIS — M79605 Pain in left leg: Secondary | ICD-10-CM | POA: Diagnosis not present

## 2019-06-05 DIAGNOSIS — Z86018 Personal history of other benign neoplasm: Secondary | ICD-10-CM

## 2019-06-05 DIAGNOSIS — M79604 Pain in right leg: Secondary | ICD-10-CM | POA: Diagnosis not present

## 2019-06-05 DIAGNOSIS — E119 Type 2 diabetes mellitus without complications: Secondary | ICD-10-CM

## 2019-06-05 DIAGNOSIS — G4730 Sleep apnea, unspecified: Secondary | ICD-10-CM

## 2019-06-05 DIAGNOSIS — Z9889 Other specified postprocedural states: Secondary | ICD-10-CM

## 2019-06-05 HISTORY — DX: Sleep apnea, unspecified: G47.30

## 2019-06-05 NOTE — Assessment & Plan Note (Signed)
H/O bilateral SFA PTA- patent on LEA doppler Aug 2019

## 2019-06-05 NOTE — Assessment & Plan Note (Addendum)
Neuropathy- Hgb A1c 6.21 Mar 2018 Followed at pain clinic

## 2019-06-05 NOTE — Assessment & Plan Note (Signed)
Bilateral leg pain- some feature sound secondary to neuropathy, some sound c/w claudication

## 2019-06-05 NOTE — Patient Instructions (Signed)
Medication Instructions:  Your physician recommends that you continue on your current medications as directed. Please refer to the Current Medication list given to you today. If you need a refill on your cardiac medications before your next appointment, please call your pharmacy.   Lab work: None  If you have labs (blood work) drawn today and your tests are completely normal, you will receive your results only by: [name] MyChart Message (if you have MyChart) OR . A paper copy in the mail If you have any lab test that is abnormal or we need to change your treatment, we will call you to review the results.  Testing/Procedures: Your physician has requested that you have a lower extremity arterial exercise duplex. During this test, exercise and ultrasound are used to evaluate arterial blood flow in the legs. Allow one hour for this exam. There are no restrictions or special instructions.  Your physician has requested that you have an ankle brachial index (ABI). During this test an ultrasound and blood pressure cuff are used to evaluate the arteries that supply the arms and legs with blood. Allow thirty minutes for this exam. There are no restrictions or special instructions.  Follow-Up: At [facility], you and your health needs are our priority.  As part of our continuing mission to provide you with exceptional heart care, we have created designated Provider Care Teams.  These Care Teams include your primary Cardiologist (physician) and Advanced Practice Providers (APPs -  Physician Assistants and Nurse Practitioners) who all work together to provide you with the care you need, when you need it. [name] YOUR FOLLOW UP WILL BE DETERMINED ONCE YOUR VASCULAR STUDIES HAVE BEEN COMPLETED.  Any Other Special Instructions Will Be Listed Below (If Applicable).

## 2019-06-05 NOTE — Assessment & Plan Note (Signed)
[facility] Feb 2020

## 2019-06-05 NOTE — Progress Notes (Signed)
Cardiology Office Note:    Date:  06/05/2019   ID:  Steven Kindred., DOB [birth year], MRN [id number]  PCP:  [name], MD  Cardiologist:  Dr Gwenlyn Found Dr Fletcher Anon PV Electrophysiologist:  None   Referring MD: [name], MD   Chief Complaint  Patient presents with  . other    F/U poor circulation in legs.Medicatications reviewed verbally.    History of Present Illness:    Steven Cazarez. is a 63 y.o. male with a hx of peripheral vascular disease, coronary disease, diabetes with neuropathy, dyslipidemia, central hypertension, sleep apnea, and status post resection of a pituitary adenoma in February of this year at [facility].  Patient had bypass surgery in 2011, catheterization in 2017 showed patent anatomy and the plan was for medical therapy.  He has vascular disease and is status post bilateral SFA PTA in the past, his ABIs in August 2019 suggested patency.  He has diabetes with chronic lower extremity neuropathy and chronic pain.  He is followed at pain clinic at [facility].  He is in the office today for follow-up, he was called by the office to come in.  The patient complains of pain in both his legs.  He has pain at night and with walking.  He has diminished pulses on exam in the left femoral artery bruit.  From a cardiac standpoint he appears to be stable, he denies any exertional chest pain or unusual shortness of breath.  He did have a pituitary adenoma resection at [facility] in February and tolerated this well.  Past Medical History:  Diagnosis Date  . Anemia, unspecified   . Bell's palsy   . Blood transfusion    during treatment for Ca  . Blood transfusion without reported diagnosis   . CAD (coronary artery disease)    a. s/p multiple PCIs;  b. s/p CABG in 09/2010 (LIMA-LAD, SVG-OM1, SVG-distal RCA/OM2);  Cardiac cath in 12/2015: Mild LAD disease, occluded mid LCX and proximal RCA (left to right collaterals), Occluded SVG to RCA and atretic LIMA. Patent SVG to OM   . Chronic back pain   . Diabetes mellitus without complication ([place])   . DJD (degenerative joint disease)    low back & all over   . GERD (gastroesophageal reflux disease)   . Helicobacter pylori (H. pylori) infection   . Hemorrhoids   . Hyperlipidemia   . Hypertension   . Impotence of organic origin   . Myocardial infarction ([place])    2005 and 2012   . PAD (peripheral artery disease) ([place])    a. s/p bilat SFA stents;  b. ABIs 11/2012: R 0.99, L 0.86.  [name] Pancreatic cancer [facility])    surgery 2009  . Pituitary adenoma [facility])    surgery at [facility] Feb 2020  . Pre-diabetes 08/16/2015  . Sleep apnea 06/05/2019    Past Surgical History:  Procedure Laterality Date  . ABDOMINAL AORTAGRAM N/A 09/13/2013   Procedure: ABDOMINAL Maxcine Ham;  Surgeon: Wellington Hampshire, MD;  Location: [place] CATH LAB;  Service: Cardiovascular;  Laterality: N/A;  . ABDOMINAL AORTAGRAM N/A 08/29/2014   Procedure: ABDOMINAL Maxcine Ham;  Surgeon: Wellington Hampshire, MD;  Location: [place] CATH LAB;  Service: Cardiovascular;  Laterality: N/A;  . BACK SURGERY     1992  . CARDIAC CATHETERIZATION N/A 12/25/2015   Procedure: Left Heart Cath and Cors/Grafts Angiography;  Surgeon: Wellington Hampshire, MD;  Location: [place] CV LAB;  Service: Cardiovascular;  Laterality: N/A;  . CORONARY ARTERY  BYPASS GRAFT  nov 2011   x 4  . ESOPHAGOGASTRODUODENOSCOPY (EGD) WITH PROPOFOL N/A 04/02/2017   Procedure: ESOPHAGOGASTRODUODENOSCOPY (EGD) WITH PROPOFOL;  Surgeon: Carol Ada, MD;  Location: [facility] ENDOSCOPY;  Service: Endoscopy;  Laterality: N/A;  . HEMORRHOID SURGERY  10/30/2011   Procedure: HEMORRHOIDECTOMY;  Surgeon: Harl Bowie, MD;  Location: [place];  Service: General;  Laterality: N/A;  . JOINT REPLACEMENT    . pancreatic  cancer  05/10/2008   Resection of distal panrease and spleen  . PARTIAL KNEE ARTHROPLASTY Right 11/19/2014   Procedure: RIGHT UNI-KNEE ARTHROPLASTY MEDIALLY;  Surgeon: Mauri Pole, MD;  Location: [facility] ORS;  Service:  Orthopedics;  Laterality: Right;  . PERIPHERAL VASCULAR CATHETERIZATION  Oct 2014   Lt SFA PTA  . PERIPHERAL VASCULAR CATHETERIZATION  Oct 2015   ISR Lt SFA-PTA  . right partial knee replacement    . splenectomy    . THROMBECTOMY FEMORAL ARTERY Left 09/13/2013   Procedure: THROMBECTOMY FEMORAL ARTERY;  Surgeon: Wellington Hampshire, MD;  Location: [place] CATH LAB;  Service: Cardiovascular;  Laterality: Left;  . TRANSPHENOIDAL / TRANSNASAL HYPOPHYSECTOMY / RESECTION PITUITARY TUMOR  12/2018   [facility]    Current Medications: Current Meds  Medication Sig  . aspirin EC 81 MG tablet Take 1 tablet (81 mg total) by mouth daily.  [name] atorvastatin (LIPITOR) 40 MG tablet Take 1 tablet (40 mg total) by mouth daily. (Patient taking differently: Take 40 mg by mouth at bedtime. )  . fish oil-omega-3 fatty acids 1000 MG capsule Take 1 capsule (1 g total) by mouth daily.  [name] lisinopril (PRINIVIL,ZESTRIL) 5 MG tablet TAKE 0.5 TABLETS BY MOUTH  DAILY.  . metFORMIN (GLUCOPHAGE) 500 MG tablet Take 1 tablet (500 mg total) by mouth daily with breakfast.  . metoprolol tartrate (LOPRESSOR) 25 MG tablet Take 1 tablet by mouth two  times daily  . Multiple Vitamin (MULTIVITAMIN) tablet Take 1 tablet by mouth daily.  . pantoprazole (PROTONIX) 40 MG tablet TAKE 1 TABLET BY MOUTH ONCE DAILY  . pregabalin (LYRICA) 150 MG capsule Take 1 capsule (150 mg total) by mouth daily.  . sildenafil (VIAGRA) 100 MG tablet Take 0.5-1 tablets (50-100 mg total) by mouth daily as needed for erectile dysfunction.  . tamsulosin (FLOMAX) 0.4 MG CAPS capsule TAKE 1 CAPSULE BY MOUTH DAILY.     Allergies:   Patient has no known allergies.   Social History   Socioeconomic History  . Marital status: Single    Spouse name: Not on file  . Number of children: 0  . Years of education: Not on file  . Highest education level: Not on file  Occupational History  . Occupation: TEFL teacher: UNEMPLOYED  Social Needs  . Financial resource  strain: Not on file  . Food insecurity    Worry: Not on file    Inability: Not on file  . Transportation needs    Medical: Not on file    Non-medical: Not on file  Tobacco Use  . Smoking status: Former Smoker    Types: Cigarettes    Quit date: 11/29/2008    Years since quitting: 10.5  . Smokeless tobacco: Never Used  Substance and Sexual Activity  . Alcohol use: Yes    Alcohol/week: 3.0 standard drinks    Types: 3 Shots of liquor per week    Comment: 2 drinks of brandy every week   . Drug use: No  . Sexual activity: Not Currently  Lifestyle  . Physical  activity    Days per week: Not on file    Minutes per session: Not on file  . Stress: Not on file  Relationships  . Social Herbalist on phone: Not on file    Gets together: Not on file    Attends religious service: Not on file    Active member of club or organization: Not on file    Attends meetings of clubs or organizations: Not on file    Relationship status: Not on file  Other Topics Concern  . 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 There is no history of Anesthesia problems, Hypotension, Malignant hyperthermia, Pseudochol deficiency, Colon cancer, Esophageal cancer, Prostate cancer, Rectal cancer, or Stomach cancer.  ROS:   Please see the history of present illness.     All other systems reviewed and are negative.  EKGs/Labs/Other Studies Reviewed:    The following studies were reviewed today: LEA dopplers Aug 2019  EKG:  EKG is ordered today.  The ekg ordered today demonstrates NSR, SB- 56  Recent Labs: 03/21/2019: ALT 18; BUN 8; Creatinine, Ser 0.94; Hemoglobin 12.3; Platelets 346; Potassium 4.7; Sodium 139  Recent Lipid Panel    Component Value Date/Time   CHOL 152 11/07/2018 1056   TRIG 141  11/07/2018 1056   HDL 41 11/07/2018 1056   CHOLHDL 3.7 11/07/2018 1056   CHOLHDL 3.2 08/17/2016 0925   VLDL 28 08/17/2016 0925   LDLCALC 83 11/07/2018 1056   LDLDIRECT 80 11/06/2013 0925    Physical Exam:    VS:  BP 117/74 (BP Location: Left Arm, Patient Position: Sitting, Cuff Size: Large)   Pulse (!) 54   Temp (!) 97.2 F (36.2 C) (Temporal)   Ht 5\' 9"  (1.753 m)   Wt 228 lb (103.4 kg)   SpO2 96%   BMI 33.67 kg/m     Wt Readings from Last 3 Encounters:  06/05/19 228 lb (103.4 kg)  03/21/19 217 lb (98.4 kg)  11/07/18 228 lb 6.4 oz (103.6 kg)     GEN:  Well nourished, well developed in no acute distress HEENT: Normal NECK: No JVD; No carotid bruits LYMPHATICS: No lymphadenopathy CARDIAC: RRR, no murmurs, rubs, gallops RESPIRATORY:  Clear to auscultation without rales, wheezing or rhonchi, mid sternal CABG scar ABDOMEN: LUQ surgical scar, midline surgical scar, Soft, non-tender, non-distended MUSCULOSKELETAL:  No edema; LEA pulses diminished bilaterally, LFA bruit  No deformity  SKIN: Warm and dry NEUROLOGIC:  Alert and oriented x 3 PSYCHIATRIC:  Normal affect   ASSESSMENT:    Leg pain Bilateral leg pain- some feature sound secondary to neuropathy, some sound c/w claudication  PVD (peripheral vascular disease) (HCC) H/O bilateral SFA PTA- patent on LEA doppler Aug 2019  Non-insulin dependent type 2 diabetes mellitus (HCC) Neuropathy- Hgb A1c 6.21 Mar 2018 Followed at pain clinic  Hx of CABG 2011 01/16/2010 - NSTEMI  CABG x 19 Sep 2010 known totally occluded RCA-not bypassed- medical rx Cath 2017- medical rx  S/P selective transsphenoidal pituitary adenomectomy [facility] Feb 2020  Sleep apnea Not on C-pap  PLAN:    Check LEA dopplers- if normal consider statin holiday to see if that makes any difference in his hip pain. F/U depending on the above.  Medication Adjustments/Labs and Tests Ordered: Current medicines are reviewed at length with the patient today.   Concerns regarding medicines are outlined above.  Orders Placed This Encounter  Procedures  . EKG 12-Lead   No orders of the defined types were placed in this encounter.   Patient Instructions  Medication Instructions:  Your physician recommends that you continue on your current medications as directed. Please refer to the Current Medication list given to you today. If you need a refill on your cardiac medications before your next appointment, please call your pharmacy.   Lab work: None  If you have labs (blood work) drawn today and your tests are completely normal, you will receive your results only by: [name] MyChart Message (if you have MyChart) OR . A paper copy in the mail If you have any lab test that is abnormal or we need to change your treatment, we will call you to review the results.  Testing/Procedures: Your physician has requested that you have a lower extremity arterial exercise duplex. During this test, exercise and ultrasound are used to evaluate arterial blood flow in the legs. Allow one hour for this exam. There are no restrictions or special instructions.  Your physician has requested that you have an ankle brachial index (ABI). During this test an ultrasound and blood pressure cuff are used to evaluate the arteries that supply the arms and legs with blood. Allow thirty minutes for this exam. There are no restrictions or special instructions.  Follow-Up: At [facility], you and your health needs are our priority.  As part of our continuing mission to provide you with exceptional heart care, we have created designated Provider Care Teams.  These Care Teams include your primary Cardiologist (physician) and Advanced Practice Providers (APPs -  Physician Assistants and Nurse Practitioners) who all work together to provide you with the care you need, when you need it. [name] YOUR FOLLOW UP WILL BE DETERMINED ONCE YOUR VASCULAR STUDIES HAVE BEEN COMPLETED.  Any Other Special  Instructions Will Be Listed Below (If Applicable).      Signed, Kerin Ransom, PA-C  06/05/2019 9:14 AM    [facility]

## 2019-06-05 NOTE — Assessment & Plan Note (Signed)
01/16/2010 - NSTEMI  CABG x 19 Sep 2010 known totally occluded RCA-not bypassed- medical rx Cath 2017- medical rx

## 2019-06-05 NOTE — Assessment & Plan Note (Signed)
Not on C-pap

## 2019-06-19 ENCOUNTER — Inpatient Hospital Stay (HOSPITAL_COMMUNITY): Admission: RE | Admit: 2019-06-19 | Payer: Medicare Other | Source: Ambulatory Visit

## 2019-06-19 ENCOUNTER — Ambulatory Visit (HOSPITAL_COMMUNITY)
Admission: RE | Admit: 2019-06-19 | Discharge: 2019-06-19 | Disposition: A | Discharge status: Home / Self Care | Payer: Medicare Other | Source: Ambulatory Visit | Attending: Cardiology | Admitting: Cardiology

## 2019-06-19 ENCOUNTER — Telehealth: Payer: Self-pay | Admitting: Cardiovascular Disease

## 2019-06-19 ENCOUNTER — Telehealth: Payer: Self-pay

## 2019-06-19 ENCOUNTER — Encounter (HOSPITAL_COMMUNITY): Payer: Self-pay | Admitting: Radiology

## 2019-06-19 ENCOUNTER — Encounter: Payer: Medicare Other | Admitting: Physician Assistant

## 2019-06-19 ENCOUNTER — Other Ambulatory Visit: Payer: Self-pay

## 2019-06-19 DIAGNOSIS — E119 Type 2 diabetes mellitus without complications: Secondary | ICD-10-CM | POA: Diagnosis not present

## 2019-06-19 DIAGNOSIS — E785 Hyperlipidemia, unspecified: Secondary | ICD-10-CM | POA: Insufficient documentation

## 2019-06-19 DIAGNOSIS — M79604 Pain in right leg: Secondary | ICD-10-CM | POA: Insufficient documentation

## 2019-06-19 DIAGNOSIS — I739 Peripheral vascular disease, unspecified: Secondary | ICD-10-CM | POA: Diagnosis not present

## 2019-06-19 DIAGNOSIS — M79605 Pain in left leg: Secondary | ICD-10-CM | POA: Diagnosis not present

## 2019-06-19 NOTE — Telephone Encounter (Signed)
Left a message for the patient to call back. According to epic he had an appointment today at 3:45 for chest pain follow up. He had canceled that appointment.

## 2019-06-19 NOTE — Progress Notes (Signed)
This encounter was created in error - please disregard.

## 2019-06-19 NOTE — Telephone Encounter (Addendum)
Pt vascular tech, pt was instructed to go to waiting area and check in for scheduled appointment with PA. She report pt stated he was leaving and would call back to reschedule. Nurse attempted to contact pt and left message to call back. Per chart review, pt called back and canceled appointment and said he is going to call back to reschedule appointment.

## 2019-06-19 NOTE — Telephone Encounter (Signed)
Pt was in office today for LEA and reported to vascular tech that he has been having c/o on and off chest pain. Nurse evaluated pt and he report he has been having symptoms for the past 2 weeks that similar to what he experienced with previous MI. Pt denies active CP at the moment. PA notified of pts symptoms and recommended pt be added to his schedule today.   Pt scheduled today at 3: 45 pm.

## 2019-06-19 NOTE — Progress Notes (Unsigned)
  Patient was her today for a follow up bilateral arterial duplex and ABI, s/p bilateral mid-distal SFA stents.  Post exam patient reported having chest tightness and acid reflux symptoms. He also reports he has been having these symptoms for the past 2-3 weeks. He states the symptoms are the same as when he had his heart attack.   Lars Mage, RN assessed patient in [place] [facility] and told patient that Almyra Deforest, PA would be able to see him today.   As I was taking patient back to the lobby he refused to stay and said he would call back and make an appointment. I advised patient twice to stay, and twice he stated he would call back to make an appointment.  Sharlett Iles, RVT [facility]

## 2019-06-19 NOTE — Telephone Encounter (Signed)
New Message    Pt is returning a call, Please call back

## 2019-06-20 NOTE — Telephone Encounter (Signed)
Called patient; left message to call back.

## 2019-06-20 NOTE — Telephone Encounter (Signed)
Follow up  ° ° °Pt is returning call  ° ° °Please call back  °

## 2019-06-21 NOTE — Telephone Encounter (Signed)
Follow up ° ° °Patient is returning your call. Please call. ° ° ° °

## 2019-06-21 NOTE — Telephone Encounter (Signed)
Returned call to patient no answer.LMTC. 

## 2019-06-21 NOTE — Telephone Encounter (Signed)
LMTCB

## 2019-06-27 ENCOUNTER — Encounter: Payer: Self-pay | Admitting: Cardiovascular Disease

## 2019-06-27 ENCOUNTER — Other Ambulatory Visit: Payer: Self-pay

## 2019-06-27 ENCOUNTER — Ambulatory Visit (INDEPENDENT_AMBULATORY_CARE_PROVIDER_SITE_OTHER): Payer: Medicare Other | Admitting: Cardiovascular Disease

## 2019-06-27 VITALS — BP 118/68 | HR 70 | Temp 97.1°F | Ht 69.0 in | Wt 226.4 lb

## 2019-06-27 DIAGNOSIS — I2510 Atherosclerotic heart disease of native coronary artery without angina pectoris: Secondary | ICD-10-CM | POA: Diagnosis not present

## 2019-06-27 DIAGNOSIS — I739 Peripheral vascular disease, unspecified: Secondary | ICD-10-CM | POA: Diagnosis not present

## 2019-06-27 DIAGNOSIS — I10 Essential (primary) hypertension: Secondary | ICD-10-CM | POA: Diagnosis not present

## 2019-06-27 DIAGNOSIS — E785 Hyperlipidemia, unspecified: Secondary | ICD-10-CM

## 2019-06-27 NOTE — Patient Instructions (Signed)
Medication Instructions:  Your Physician recommend you continue on your current medication as directed.    If you need a refill on your cardiac medications before your next appointment, please call your pharmacy.   Lab work: None  Testing/Procedures: None  Follow-Up: At [facility], you and your health needs are our priority.  As part of our continuing mission to provide you with exceptional heart care, we have created designated Provider Care Teams.  These Care Teams include your primary Cardiologist (physician) and Advanced Practice Providers (APPs -  Physician Assistants and Nurse Practitioners) who all work together to provide you with the care you need, when you need it. You will need a follow up appointment in 6 months.  Please call our office 2 months in advance to schedule this appointment.  You may see Dr. Fletcher Anon or one of the following Advanced Practice Providers on your designated Care Team:   Kerin Ransom, PA-C Roby Lofts, [place] . Sande Rives, PA-C

## 2019-06-27 NOTE — Progress Notes (Signed)
Cardiology Office Note   Date:  06/27/2019   ID:  Steven Hale., DOB [birth year], MRN [id number]  PCP:  [name], MD  Cardiologist:  Dr. Lionel December chief complaint on file.     History of Present Illness: Deovion Batrez. is a 63 y.o. male who presents for a followup visit regarding peripheral arterial disease and coronary artery disease. He has known history of coronary artery disease with multiple interventions in the past. He had CABG in 2011.  He quit smoking in 2009.  He had pituitary adenoma resection in February of this year. He has known history of peripheral arterial disease with intervention on both SFAs. Cardiac catheterization in January, 2017 showed mild nonobstructive LAD disease, occluded mid left circumflex and occluded proximal right coronary artery with left-to-right collaterals. SVG to RCA was occluded and LIMA to LAD was atretic. SVG to OM was normal. Abnormal stress test was due to chronically occluded right coronary artery and graft with left-to-right collaterals. His native RCA was not favorable for CTO PCI. I recommended medical therapy. Imdur was added.   He was seen recently by Kerin Ransom and complained of worsening bilateral leg pain with left femoral artery bruit. He underwent noninvasive vascular studies which with an ABI of 0.82 on the right and 1.01 on the left.  Duplex showed significant right mid SFA stenosis with patent stent without restenosis.Left SFA stent was patent without significant restenosis.  He reports bilateral leg pain that starts in the lower back as pressure.  The pain is equal in both sides.  He also complains of numbness and needle sensation in both feet.  He continues to walk for exercise.  He does report increased heartburn that sometimes responds to nitroglycerin.  In spite of that, he denies exertional chest pain.  Past Medical History:  Diagnosis Date  . Anemia, unspecified   . Bell's palsy   . Blood  transfusion    during treatment for Ca  . Blood transfusion without reported diagnosis   . CAD (coronary artery disease)    a. s/p multiple PCIs;  b. s/p CABG in 09/2010 (LIMA-LAD, SVG-OM1, SVG-distal RCA/OM2);  Cardiac cath in 12/2015: Mild LAD disease, occluded mid LCX and proximal RCA (left to right collaterals), Occluded SVG to RCA and atretic LIMA. Patent SVG to OM  . Chronic back pain   . Diabetes mellitus without complication ([place])   . DJD (degenerative joint disease)    low back & all over   . GERD (gastroesophageal reflux disease)   . Helicobacter pylori (H. pylori) infection   . Hemorrhoids   . Hyperlipidemia   . Hypertension   . Impotence of organic origin   . Myocardial infarction ([place])    2005 and 2012   . PAD (peripheral artery disease) ([place])    a. s/p bilat SFA stents;  b. ABIs 11/2012: R 0.99, L 0.86.  [name] Pancreatic cancer [facility])    surgery 2009  . Pituitary adenoma [facility])    surgery at [facility] Feb 2020  . Pre-diabetes 08/16/2015  . Sleep apnea 06/05/2019    Past Surgical History:  Procedure Laterality Date  . ABDOMINAL AORTAGRAM N/A 09/13/2013   Procedure: ABDOMINAL Maxcine Ham;  Surgeon: Wellington Hampshire, MD;  Location: [place] CATH LAB;  Service: Cardiovascular;  Laterality: N/A;  . ABDOMINAL AORTAGRAM N/A 08/29/2014   Procedure: ABDOMINAL Maxcine Ham;  Surgeon: Wellington Hampshire, MD;  Location: [place] CATH LAB;  Service: Cardiovascular;  Laterality: N/A;  . BACK SURGERY  [place] N/A 12/25/2015   Procedure: Left Heart Cath and Cors/Grafts Angiography;  Surgeon: Wellington Hampshire, MD;  Location: [place] CV LAB;  Service: Cardiovascular;  Laterality: N/A;  . CORONARY ARTERY BYPASS GRAFT  nov 2011   x 4  . ESOPHAGOGASTRODUODENOSCOPY (EGD) WITH PROPOFOL N/A 04/02/2017   Procedure: ESOPHAGOGASTRODUODENOSCOPY (EGD) WITH PROPOFOL;  Surgeon: Carol Ada, MD;  Location: [facility] ENDOSCOPY;  Service: Endoscopy;  Laterality: N/A;  . HEMORRHOID SURGERY  10/30/2011    Procedure: HEMORRHOIDECTOMY;  Surgeon: Harl Bowie, MD;  Location: [place];  Service: General;  Laterality: N/A;  . JOINT REPLACEMENT    . pancreatic  cancer  05/10/2008   Resection of distal panrease and spleen  . PARTIAL KNEE ARTHROPLASTY Right 11/19/2014   Procedure: RIGHT UNI-KNEE ARTHROPLASTY MEDIALLY;  Surgeon: Mauri Pole, MD;  Location: [facility] ORS;  Service: Orthopedics;  Laterality: Right;  . PERIPHERAL VASCULAR CATHETERIZATION  Oct 2014   Lt SFA PTA  . PERIPHERAL VASCULAR CATHETERIZATION  Oct 2015   ISR Lt SFA-PTA  . right partial knee replacement    . splenectomy    . THROMBECTOMY FEMORAL ARTERY Left 09/13/2013   Procedure: THROMBECTOMY FEMORAL ARTERY;  Surgeon: Wellington Hampshire, MD;  Location: [place] CATH LAB;  Service: Cardiovascular;  Laterality: Left;  . TRANSPHENOIDAL / TRANSNASAL HYPOPHYSECTOMY / RESECTION PITUITARY TUMOR  12/2018   [facility]     Current Outpatient Medications  Medication Sig Dispense Refill  . aspirin EC 81 MG tablet Take 1 tablet (81 mg total) by mouth daily.    [name] atorvastatin (LIPITOR) 40 MG tablet Take 1 tablet (40 mg total) by mouth daily. (Patient taking differently: Take 40 mg by mouth at bedtime. ) 90 tablet 3  . fish oil-omega-3 fatty acids 1000 MG capsule Take 1 capsule (1 g total) by mouth daily. 60 capsule 1  . lisinopril (PRINIVIL,ZESTRIL) 5 MG tablet TAKE 0.5 TABLETS BY MOUTH  DAILY. 15 tablet 0  . metFORMIN (GLUCOPHAGE) 500 MG tablet Take 1 tablet (500 mg total) by mouth daily with breakfast. 90 tablet 3  . metoprolol tartrate (LOPRESSOR) 25 MG tablet Take 1 tablet by mouth two  times daily 60 tablet 0  . Multiple Vitamin (MULTIVITAMIN) tablet Take 1 tablet by mouth daily. 30 tablet 10  . pantoprazole (PROTONIX) 40 MG tablet TAKE 1 TABLET BY MOUTH ONCE DAILY 30 tablet 3  . pregabalin (LYRICA) 150 MG capsule Take 1 capsule (150 mg total) by mouth daily. 30 capsule 0  . sildenafil (VIAGRA) 100 MG tablet Take 0.5-1 tablets (50-100 mg total) by mouth  daily as needed for erectile dysfunction. 5 tablet 11  . tamsulosin (FLOMAX) 0.4 MG CAPS capsule TAKE 1 CAPSULE BY MOUTH DAILY. 60 capsule 0   No current facility-administered medications for this visit.     Allergies:   Patient has no known allergies.    Social History:  The patient  reports that he quit smoking about 10 years ago. His smoking use included cigarettes. He has never used smokeless tobacco. He reports current alcohol use of about 3.0 standard drinks of alcohol per week. He reports that he does not use drugs.   Family History:  The patient's family history includes Cancer in his father; Heart attack in his father.    ROS:  Please see the history of present illness.   Otherwise, review of systems are positive for none.   All other systems are reviewed and negative.    PHYSICAL EXAM: VS:  BP 118/68   Pulse 70   Temp (!) 97.1 F (36.2 C)   Ht 5\' 9"  (1.753 m)   Wt 226 lb 6.4 oz (102.7 kg)   SpO2 95%   BMI 33.43 kg/m  , BMI Body mass index is 33.43 kg/m. GEN: Well nourished, well developed, in no acute distress  HEENT: normal  Neck: no JVD, carotid bruits, or masses Cardiac: RRR; no murmurs, rubs, or gallops,no edema  Respiratory:  clear to auscultation bilaterally, normal work of breathing GI: soft, nontender, nondistended, + BS MS: no deformity or atrophy  Skin: warm and dry, no rash Neuro:  Strength and sensation are intact Psych: euthymic mood, full affect Distal pulses are not palpable.  EKG:  EKG is not ordered today.    Recent Labs: 03/21/2019: ALT 18; BUN 8; Creatinine, Ser 0.94; Hemoglobin 12.3; Platelets 346; Potassium 4.7; Sodium 139    Lipid Panel    Component Value Date/Time   CHOL 152 11/07/2018 1056   TRIG 141 11/07/2018 1056   HDL 41 11/07/2018 1056   CHOLHDL 3.7 11/07/2018 1056   CHOLHDL 3.2 08/17/2016 0925   VLDL 28 08/17/2016 0925   LDLCALC 83 11/07/2018 1056   LDLDIRECT 80 11/06/2013 0925      Wt Readings from Last 3 Encounters:   06/27/19 226 lb 6.4 oz (102.7 kg)  06/05/19 228 lb (103.4 kg)  03/21/19 217 lb (98.4 kg)        ASSESSMENT AND PLAN:   1. Peripheral arterial disease: Previous bilateral SFA intervention.  Recent noninvasive vascular studies showed a drop in ABI to the mild range on the right side with evidence of possibly significant mid SFA stenosis.  However, the patient symptoms are very atypical and seems to be a combination of muscle spasm and peripheral neuropathy in addition to a possible component of claudication.   I recommend continuing medical therapy for now and if there is worsening right calf claudication, we can consider proceeding with angiography and possible endovascular intervention.  2. Peripheral diabetic neuropathy: Currently on Lyrica with improvement in symptoms.  3. Coronary artery disease involving native coronary arteries with stable angina:  Symptoms are well-controlled with medications.  4. Essential hypertension: Blood pressure is controlled on current medications.  5. Hyperlipidemia: Continue treatment with atorvastatin.  LDL improved gradually and most recent was 83.  Disposition:   FU with me in 6 months  Signed,  Kathlyn Sacramento, MD  06/27/2019 4:23 PM    [place]

## 2019-06-28 NOTE — Telephone Encounter (Signed)
Attempted to reach the patient. There was no answer.  The patient has an appointment on 8/18 with Almyra Deforest, PA

## 2019-06-30 NOTE — Telephone Encounter (Signed)
Called patient, he states he was just seen by Dr.Arida and does not need the appointment with PA any longer, he would like to cancel.  Patient has no concerns or questions at this time.

## 2019-07-04 ENCOUNTER — Ambulatory Visit: Payer: Medicare Other | Admitting: Physician Assistant

## 2019-07-04 DIAGNOSIS — H401131 Primary open-angle glaucoma, bilateral, mild stage: Secondary | ICD-10-CM | POA: Diagnosis not present

## 2019-07-04 DIAGNOSIS — E119 Type 2 diabetes mellitus without complications: Secondary | ICD-10-CM | POA: Diagnosis not present

## 2019-07-07 MED FILL — PANTOPRAZOLE SOD DR 40 MG T: 40 | 30 days supply | Qty: 30 | Fill #1

## 2019-07-31 ENCOUNTER — Other Ambulatory Visit: Payer: Self-pay | Admitting: Family Medicine

## 2019-07-31 MED FILL — PANTOPRAZOLE SOD DR 40 MG T: 40 | 30 days supply | Qty: 30 | Fill #2

## 2019-07-31 MED FILL — TAMSULOSIN HCL 0.4 MG CAP: 0.4 | 60 days supply | Qty: 60 | Fill #0

## 2019-08-07 ENCOUNTER — Telehealth: Payer: Self-pay | Admitting: *Deleted

## 2019-08-07 NOTE — Telephone Encounter (Signed)
Records faxed to [facility] - att Vernell Morgans - Release [id number]

## 2019-08-29 ENCOUNTER — Encounter: Payer: Self-pay | Admitting: Emergency Medicine

## 2019-08-29 ENCOUNTER — Other Ambulatory Visit: Payer: Self-pay

## 2019-08-29 ENCOUNTER — Ambulatory Visit (INDEPENDENT_AMBULATORY_CARE_PROVIDER_SITE_OTHER): Payer: Medicare Other | Admitting: Emergency Medicine

## 2019-08-29 VITALS — BP 115/71 | HR 61 | Temp 98.8°F | Resp 16 | Ht 69.0 in | Wt 230.4 lb

## 2019-08-29 DIAGNOSIS — I739 Peripheral vascular disease, unspecified: Secondary | ICD-10-CM

## 2019-08-29 DIAGNOSIS — E1142 Type 2 diabetes mellitus with diabetic polyneuropathy: Secondary | ICD-10-CM

## 2019-08-29 NOTE — Patient Instructions (Addendum)
Increase pregabalin to 150 mg twice a day.   If you have lab work done today you will be contacted with your lab results within the next 2 weeks.  If you have not heard from [place] then please contact us. The fastest way to get your results is to register for My Chart.   IF you received an x-ray today, you will receive an invoice from [facility]. Please contact [facility] at [phone] with questions or concerns regarding your invoice.   IF you received labwork today, you will receive an invoice from [place]. Please contact [facility] at [phone] with questions or concerns regarding your invoice.   Our billing staff will not be able to assist you with questions regarding bills from these companies.  You will be contacted with the lab results as soon as they are available. The fastest way to get your results is to activate your My Chart account. Instructions are located on the last page of this paperwork. If you have not heard from [place] regarding the results in 2 weeks, please contact this office.     Diabetic Neuropathy Diabetic neuropathy refers to nerve damage that is caused by diabetes (diabetes mellitus). Over time, people with diabetes can develop nerve damage throughout the body. There are several types of diabetic neuropathy:  Peripheral neuropathy. This is the most common type of diabetic neuropathy. It causes damage to nerves that carry signals between the spinal cord and other parts of the body (peripheral nerves). This usually affects nerves in the feet and legs first, and may eventually affect the hands and arms. The damage affects the ability to sense touch or temperature.  Autonomic neuropathy. This type causes damage to nerves that control involuntary functions (autonomic nerves). These nerves carry signals that control: ? Heartbeat. ? Body temperature. ? Blood pressure. ? Urination. ? Digestion. ? Sweating. ? Sexual function. ? Response to changing  blood sugar (glucose) levels.  Focal neuropathy. This type of nerve damage affects one area of the body, such as an arm, a leg, or the face. The injury may involve one nerve or a small group of nerves. Focal neuropathy can be painful and unpredictable, and occurs most often in older adults with diabetes. This often develops suddenly, but usually improves over time and does not cause long-term problems.  Proximal neuropathy. This type of nerve damage affects the nerves of the thighs, hips, buttocks, or legs. It causes severe pain, weakness, and muscle death (atrophy), usually in the thigh muscles. It is more common among older men and people who have type 2 diabetes. The length of recovery time may vary. What are the causes? Peripheral, autonomic, and focal neuropathies are caused by diabetes that is not well controlled with treatment. The cause of proximal neuropathy is not known, but it may be caused by inflammation related to uncontrolled blood glucose levels. What are the signs or symptoms? Peripheral neuropathy Peripheral neuropathy develops slowly over time. When the nerves of the feet and legs no longer work, you may experience:  Burning, stabbing, or aching pain in the legs or feet.  Pain or cramping in the legs or feet.  Loss of feeling (numbness) and inability to feel pressure or pain in the feet. This can lead to: ? Thick calluses or sores on areas of constant pressure. ? Ulcers. ? Reduced ability to feel temperature changes.  Foot deformities.  Muscle weakness.  Loss of balance or coordination. Autonomic neuropathy The symptoms of autonomic neuropathy vary depending on which nerves  are affected. Symptoms may include:  Problems with digestion, such as: ? Nausea or vomiting. ? Poor appetite. ? Bloating. ? Diarrhea or constipation. ? Trouble swallowing. ? Losing weight without trying to.  Problems with the heart, blood and lungs, such as: ? Dizziness, especially when  standing up. ? Fainting. ? Shortness of breath. ? Irregular heartbeat.  Bladder problems, such as: ? Trouble starting or stopping urination. ? Leaking urine. ? Trouble emptying the bladder. ? Urinary tract infections (UTIs).  Problems with other body functions, such as: ? Sweat. You may sweat too much or too little. ? Temperature. You might get hot easily. Or, you might feel cold more than usual. ? Sexual function. Men may not be able to get or maintain an erection. Women may have vaginal dryness and difficulty with arousal. Focal neuropathy Symptoms affect only one area of the body. Common symptoms include:  Numbness.  Tingling.  Burning pain.  Prickling feeling.  Very sensitive skin.  Weakness.  Inability to move (paralysis).  Muscle twitching.  Muscles getting smaller (wasting).  Poor coordination.  Double or blurred vision. Proximal neuropathy  Sudden, severe pain in the hip, thigh, or buttocks. Pain may spread from the back into the legs (sciatica).  Pain and numbness in the arms and legs.  Tingling.  Loss of bladder control or bowel control.  Weakness and wasting of thigh muscles.  Difficulty getting up from a seated position.  Abdominal swelling.  Unexplained weight loss. How is this diagnosed? Diagnosis usually involves reviewing your medical history and any symptoms you have. Diagnosis varies depending on the type of neuropathy your health care provider suspects. Peripheral neuropathy Your health care provider will check areas that are affected by your nervous system (neurologic exam), such as your reflexes, how you move, and what you can feel. You may have other tests, such as:  Blood tests.  Removal and examination of fluid that surrounds the spinal cord (lumbar puncture).  CT scan.  MRI.  A test to check the nerves that control muscles (electromyogram, EMG).  Tests of how quickly messages pass through your nerves (nerve conduction  velocity tests).  Removal of a small piece of nerve to be examined under a microscope (biopsy). Autonomic neuropathy You may have tests, such as:  Tests to measure your blood pressure and heart rate. This may include monitoring you while you are safely secured to an exam table that moves you from a lying position to an upright position (table tilt test).  Breathing tests to check your lungs.  Tests to check how food moves through the digestive system (gastric emptying tests).  Blood, sweat, or urine tests.  Ultrasound of your bladder.  Spinal fluid tests. Focal neuropathy This condition may be diagnosed with:  A neurologic exam.  CT scan.  MRI.  EMG.  Nerve conduction velocity tests. Proximal neuropathy There is no test to diagnose this type of neuropathy. You may have tests to rule out other possible causes of this type of neuropathy. Tests may include:  X-rays of your spine and lumbar region.  Lumbar puncture.  MRI. How is this treated? The goal of treatment is to keep nerve damage from getting worse. The most important part of treatment is keeping your blood glucose level and your A1C level within your target range by following your diabetes management plan. Over time, maintaining lower blood glucose levels helps lessen symptoms. In some cases, you may need prescription pain medicine. Follow these instructions at home:  Lifestyle  Do not use any products that contain nicotine or tobacco, such as cigarettes and e-cigarettes. If you need help quitting, ask your health care provider.  Be physically active every day. Include strength training and balance exercises.  Follow a healthy meal plan.  Work with your health care provider to manage your blood pressure. General instructions  Follow your diabetes management plan as directed. ? Check your blood glucose levels as directed by your health care provider. ? Keep your blood glucose in your target range as  directed by your health care provider. ? Have your A1C level checked at least two times a year, or as often as told by your health care provider.  Take over the counter and prescription medicines only as told by your health care provider. This includes insulin and diabetes medicine.  Do not drive or use heavy machinery while taking prescription pain medicines.  Check your skin and feet every day for cuts, bruises, redness, blisters, or sores.  Keep all follow up visits as told by your health care provider. This is important. Contact a health care provider if:  You have burning, stabbing, or aching pain in your legs or feet.  You are unable to feel pressure or pain in your feet.  You develop problems with digestion, such as: ? Nausea. ? Vomiting. ? Bloating. ? Constipation. ? Diarrhea. ? Abdominal pain.  You have difficulty with urination, such as inability: ? To control when you urinate (incontinence). ? To completely empty the bladder (retention).  You have palpitations.  You feel dizzy, weak, or faint when you stand up. Get help right away if:  You cannot urinate.  You have sudden weakness or loss of coordination.  You have trouble speaking.  You have pain or pressure in your chest.  You have an irregular heart beat.  You have sudden inability to move a part of your body. Summary  Diabetic neuropathy refers to nerve damage that is caused by diabetes. It can affect nerves throughout the entire body, causing numbness and pain in the arms, legs, digestive tract, heart, and other body systems.  Keep your blood glucose level and your blood pressure in your target range, as directed by your health care provider. This can help prevent neuropathy from getting worse.  Check your skin and feet every day for cuts, bruises, redness, blisters, or sores.  Do not use any products that contain nicotine or tobacco, such as cigarettes and e-cigarettes. If you need help quitting,  ask your health care provider. This information is not intended to replace advice given to you by your health care provider. Make sure you discuss any questions you have with your health care provider. Document Released: 01/11/2002 Document Revised: 12/15/2017 Document Reviewed: 12/07/2016 Elsevier Patient Education  2020 [employer].

## 2019-08-29 NOTE — Progress Notes (Signed)
[name] 63 y.o.   Chief Complaint  Patient presents with  . Foot Problem    per patient started Sunday with burning and pain    HISTORY OF PRESENT ILLNESS: This is a 63 y.o. male complaining of bilateral burning and tingling sensation to legs increased since last Sunday.  Has a history of diabetic neuropathy and peripheral artery disease for years.  Denies injuries or any other associated symptoms.  HPI   Prior to Admission medications   Medication Sig Start Date End Date Taking? Authorizing Provider  aspirin EC 81 MG tablet Take 1 tablet (81 mg total) by mouth daily. 11/19/14  Yes Babish, Rodman Key, PA-C  atorvastatin (LIPITOR) 40 MG tablet Take 1 tablet (40 mg total) by mouth daily. Patient taking differently: Take 40 mg by mouth at bedtime.  07/07/16  Yes Wellington Hampshire, MD  fish oil-omega-3 fatty acids 1000 MG capsule Take 1 capsule (1 g total) by mouth daily. 11/30/12  Yes Rosana Hoes, MD  lisinopril (PRINIVIL,ZESTRIL) 5 MG tablet TAKE 0.5 TABLETS BY MOUTH  DAILY. 09/22/16  Yes Lelon Perla, MD  metFORMIN (GLUCOPHAGE) 500 MG tablet Take 1 tablet (500 mg total) by mouth daily with breakfast. 12/15/17  Yes Delia Chimes A, MD  metoprolol tartrate (LOPRESSOR) 25 MG tablet Take 1 tablet by mouth two  times daily 09/18/16  Yes Crenshaw, Denice Bors, MD  Multiple Vitamin (MULTIVITAMIN) tablet Take 1 tablet by mouth daily. 11/30/12  Yes Rosana Hoes, MD  pantoprazole (PROTONIX) 40 MG tablet TAKE 1 TABLET BY MOUTH ONCE DAILY 06/01/19  Yes Wellington Hampshire, MD  pregabalin (LYRICA) 150 MG capsule Take 1 capsule (150 mg total) by mouth daily. 05/02/18  Yes [name], MD  sildenafil (VIAGRA) 100 MG tablet Take 0.5-1 tablets (50-100 mg total) by mouth daily as needed for erectile dysfunction. 08/25/18  Yes Ginna Schuur, Ines Bloomer, MD  tamsulosin (FLOMAX) 0.4 MG CAPS capsule TAKE 1 CAPSULE BY MOUTH DAILY. 07/31/19  Yes [name], MD    No Known Allergies  Patient Active Problem  List   Diagnosis Date Noted  . Sleep apnea 06/05/2019  . Coronary artery disease involving coronary bypass graft of native heart   . Pre-diabetes 08/16/2015  . Peripheral neuropathy 07/09/2015  . Other male erectile dysfunction 03/07/2015  . Essential hypertension, benign 03/07/2015  . Pure hypercholesterolemia 12/10/2014  . Essential hypertension 12/10/2014  . S/P selective transsphenoidal pituitary adenomectomy 11/19/2014  . Lumbar disc herniation with radiculopathy 10/15/2014  . Chronic back pain 10/12/2014  . H/O pancreatic cancer 2009 10/12/2014  . Iron deficiency anemia 06/11/2014  . Testosterone deficiency 09/07/2013  . Benign paroxysmal positional vertigo 08/30/2013  . Non-insulin dependent type 2 diabetes mellitus ([place]) 08/30/2013  . PAD (peripheral artery disease) ([place]) 06/02/2012  . Claudication ([place]) 05/14/2011  . PVD (peripheral vascular disease) ([place]) 01/08/2009  . BELL'S PALSY, RIGHT 12/25/2008  . Dyslipidemia, goal LDL below 70 06/05/2008  . Hx of CABG 2011 11/17/2003    Past Medical History:  Diagnosis Date  . Anemia, unspecified   . Bell's palsy   . Blood transfusion    during treatment for Ca  . Blood transfusion without reported diagnosis   . CAD (coronary artery disease)    a. s/p multiple PCIs;  b. s/p CABG in 09/2010 (LIMA-LAD, SVG-OM1, SVG-distal RCA/OM2);  Cardiac cath in 12/2015: Mild LAD disease, occluded mid LCX and proximal RCA (left to right collaterals), Occluded SVG to RCA and atretic LIMA. Patent SVG to OM  .  Chronic back pain   . Diabetes mellitus without complication ([place])   . DJD (degenerative joint disease)    low back & all over   . GERD (gastroesophageal reflux disease)   . Helicobacter pylori (H. pylori) infection   . Hemorrhoids   . Hyperlipidemia   . Hypertension   . Impotence of organic origin   . Myocardial infarction ([place])    2005 and 2012   . PAD (peripheral artery disease) ([place])    a. s/p bilat SFA stents;  b. ABIs 11/2012:  R 0.99, L 0.86.  [name] Pancreatic cancer [facility])    surgery 2009  . Pituitary adenoma [facility])    surgery at [facility] Feb 2020  . Pre-diabetes 08/16/2015  . Sleep apnea 06/05/2019    Past Surgical History:  Procedure Laterality Date  . ABDOMINAL AORTAGRAM N/A 09/13/2013   Procedure: ABDOMINAL Maxcine Ham;  Surgeon: Wellington Hampshire, MD;  Location: [place] CATH LAB;  Service: Cardiovascular;  Laterality: N/A;  . ABDOMINAL AORTAGRAM N/A 08/29/2014   Procedure: ABDOMINAL Maxcine Ham;  Surgeon: Wellington Hampshire, MD;  Location: [place] CATH LAB;  Service: Cardiovascular;  Laterality: N/A;  . BACK SURGERY     1992  . CARDIAC CATHETERIZATION N/A 12/25/2015   Procedure: Left Heart Cath and Cors/Grafts Angiography;  Surgeon: Wellington Hampshire, MD;  Location: [place] CV LAB;  Service: Cardiovascular;  Laterality: N/A;  . CORONARY ARTERY BYPASS GRAFT  nov 2011   x 4  . ESOPHAGOGASTRODUODENOSCOPY (EGD) WITH PROPOFOL N/A 04/02/2017   Procedure: ESOPHAGOGASTRODUODENOSCOPY (EGD) WITH PROPOFOL;  Surgeon: Carol Ada, MD;  [facility];  Service: Endoscopy;  Laterality: N/A;  . HEMORRHOID SURGERY  10/30/2011   Procedure: HEMORRHOIDECTOMY;  Surgeon: Harl Bowie, MD;  Location: [place];  Service: General;  Laterality: N/A;  . JOINT REPLACEMENT    . pancreatic  cancer  05/10/2008   Resection of distal panrease and spleen  . PARTIAL KNEE ARTHROPLASTY Right 11/19/2014   Procedure: RIGHT UNI-KNEE ARTHROPLASTY MEDIALLY;  Surgeon: Mauri Pole, MD;  [facility];  Service: Orthopedics;  Laterality: Right;  . PERIPHERAL VASCULAR CATHETERIZATION  Oct 2014   Lt SFA PTA  . PERIPHERAL VASCULAR CATHETERIZATION  Oct 2015   ISR Lt SFA-PTA  . right partial knee replacement    . splenectomy    . THROMBECTOMY FEMORAL ARTERY Left 09/13/2013   Procedure: THROMBECTOMY FEMORAL ARTERY;  Surgeon: Wellington Hampshire, MD;  Location: [place] CATH LAB;  Service: Cardiovascular;  Laterality: Left;  . TRANSPHENOIDAL / TRANSNASAL  HYPOPHYSECTOMY / RESECTION PITUITARY TUMOR  12/2018   [facility]    Social History   Socioeconomic History  . Marital status: Single    Spouse name: Not on file  . Number of children: 0  . Years of education: Not on file  . Highest education level: Not on file  Occupational History  . Occupation: TEFL teacher: UNEMPLOYED  Social Needs  . Financial resource strain: Not on file  . Food insecurity    Worry: Not on file    Inability: Not on file  . Transportation needs    Medical: Not on file    Non-medical: Not on file  Tobacco Use  . Smoking status: Former Smoker    Types: Cigarettes    Quit date: 11/29/2008    Years since quitting: 10.7  . Smokeless tobacco: Never Used  Substance and Sexual Activity  . Alcohol use: Yes    Alcohol/week: 3.0 standard drinks    Types: 3  Shots of liquor per week    Comment: 2 drinks of brandy every week   . Drug use: No  . Sexual activity: Not Currently  Lifestyle  . Physical activity    Days per week: Not on file    Minutes per session: Not on file  . Stress: Not on file  Relationships  . Social Herbalist on phone: Not on file    Gets together: Not on file    Attends religious service: Not on file    Active member of club or organization: Not on file    Attends meetings of clubs or organizations: Not on file    Relationship status: Not on file  . Intimate partner violence    Fear of current or ex partner: Not on file    Emotionally abused: Not on file    Physically abused: Not on file    Forced sexual activity: Not on file  Other Topics Concern  . 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 Heart attack Father        died of MI at age 90  . Cancer Father   . Anesthesia problems Neg Hx   . Hypotension Neg Hx   . Malignant hyperthermia Neg Hx   . Pseudochol  deficiency Neg Hx   . Colon cancer Neg Hx   . Esophageal cancer Neg Hx   . Prostate cancer Neg Hx   . Rectal cancer Neg Hx   . Stomach cancer Neg Hx      Review of Systems  Constitutional: Negative.  Negative for chills and fever.  HENT: Negative.  Negative for congestion and sore throat.   Respiratory: Negative.  Negative for cough and shortness of breath.   Cardiovascular: Negative.  Negative for chest pain, palpitations and leg swelling.  Gastrointestinal: Negative.  Negative for abdominal pain, diarrhea, nausea and vomiting.  Genitourinary: Negative.  Negative for dysuria.  Musculoskeletal: Negative.  Negative for myalgias.  Skin: Negative.  Negative for rash.  Neurological: Negative.  Negative for dizziness and headaches.  All other systems reviewed and are negative.     Vitals:   08/29/19 0951  BP: 115/71  Pulse: 61  Resp: 16  Temp: 98.8 F (37.1 C)  SpO2: 97%    Physical Exam Vitals signs reviewed.  Constitutional:      Appearance: Normal appearance.  HENT:     Head: Normocephalic.  Eyes:     Extraocular Movements: Extraocular movements intact.     Pupils: Pupils are equal, round, and reactive to light.  Neck:     Musculoskeletal: Normal range of motion and neck supple.  Cardiovascular:     Rate and Rhythm: Normal rate and regular rhythm.     Pulses: Normal pulses.     Heart sounds: Normal heart sounds.  Pulmonary:     Effort: Pulmonary effort is normal.     Breath sounds: Normal breath sounds.  Musculoskeletal: Normal range of motion.     Right lower leg: No edema.     Left lower leg: No edema.     Comments: Lower extremities: Warm to touch.  Normal color.  Neurovascularly intact.  Good peripheral pulses with excellent capillary refill.  No circulatory deficits noticed.  No skin ulcers or rashes.  Skin:  General: Skin is warm and dry.     Capillary Refill: Capillary refill takes less than 2 seconds.  Neurological:     General: No focal deficit  present.     Mental Status: He is alert and oriented to person, place, and time.     Sensory: No sensory deficit.     Motor: No weakness.     Coordination: Coordination normal.     Gait: Gait normal.  Psychiatric:        Mood and Affect: Mood normal.        Behavior: Behavior normal.      ASSESSMENT & PLAN: Solace was seen today for foot problem.  Diagnoses and all orders for this visit:  Type 2 diabetes mellitus with peripheral neuropathy ([place])  Peripheral artery disease ([place])    Patient Instructions    Increase pregabalin to 150 mg twice a day.   If you have lab work done today you will be contacted with your lab results within the next 2 weeks.  If you have not heard from [place] then please contact us. The fastest way to get your results is to register for My Chart.   IF you received an x-ray today, you will receive an invoice from [facility]. Please contact [facility] at [phone] with questions or concerns regarding your invoice.   IF you received labwork today, you will receive an invoice from [place]. Please contact [facility] at [phone] with questions or concerns regarding your invoice.   Our billing staff will not be able to assist you with questions regarding bills from these companies.  You will be contacted with the lab results as soon as they are available. The fastest way to get your results is to activate your My Chart account. Instructions are located on the last page of this paperwork. If you have not heard from [place] regarding the results in 2 weeks, please contact this office.     Diabetic Neuropathy Diabetic neuropathy refers to nerve damage that is caused by diabetes (diabetes mellitus). Over time, people with diabetes can develop nerve damage throughout the body. There are several types of diabetic neuropathy:  Peripheral neuropathy. This is the most common type of diabetic neuropathy. It causes damage to nerves that carry  signals between the spinal cord and other parts of the body (peripheral nerves). This usually affects nerves in the feet and legs first, and may eventually affect the hands and arms. The damage affects the ability to sense touch or temperature.  Autonomic neuropathy. This type causes damage to nerves that control involuntary functions (autonomic nerves). These nerves carry signals that control: ? Heartbeat. ? Body temperature. ? Blood pressure. ? Urination. ? Digestion. ? Sweating. ? Sexual function. ? Response to changing blood sugar (glucose) levels.  Focal neuropathy. This type of nerve damage affects one area of the body, such as an arm, a leg, or the face. The injury may involve one nerve or a small group of nerves. Focal neuropathy can be painful and unpredictable, and occurs most often in older adults with diabetes. This often develops suddenly, but usually improves over time and does not cause long-term problems.  Proximal neuropathy. This type of nerve damage affects the nerves of the thighs, hips, buttocks, or legs. It causes severe pain, weakness, and muscle death (atrophy), usually in the thigh muscles. It is more common among older men and people who have type 2 diabetes. The length of recovery time may vary. What are the causes? Peripheral, autonomic,  and focal neuropathies are caused by diabetes that is not well controlled with treatment. The cause of proximal neuropathy is not known, but it may be caused by inflammation related to uncontrolled blood glucose levels. What are the signs or symptoms? Peripheral neuropathy Peripheral neuropathy develops slowly over time. When the nerves of the feet and legs no longer work, you may experience:  Burning, stabbing, or aching pain in the legs or feet.  Pain or cramping in the legs or feet.  Loss of feeling (numbness) and inability to feel pressure or pain in the feet. This can lead to: ? Thick calluses or sores on areas of constant  pressure. ? Ulcers. ? Reduced ability to feel temperature changes.  Foot deformities.  Muscle weakness.  Loss of balance or coordination. Autonomic neuropathy The symptoms of autonomic neuropathy vary depending on which nerves are affected. Symptoms may include:  Problems with digestion, such as: ? Nausea or vomiting. ? Poor appetite. ? Bloating. ? Diarrhea or constipation. ? Trouble swallowing. ? Losing weight without trying to.  Problems with the heart, blood and lungs, such as: ? Dizziness, especially when standing up. ? Fainting. ? Shortness of breath. ? Irregular heartbeat.  Bladder problems, such as: ? Trouble starting or stopping urination. ? Leaking urine. ? Trouble emptying the bladder. ? Urinary tract infections (UTIs).  Problems with other body functions, such as: ? Sweat. You may sweat too much or too little. ? Temperature. You might get hot easily. Or, you might feel cold more than usual. ? Sexual function. Men may not be able to get or maintain an erection. Women may have vaginal dryness and difficulty with arousal. Focal neuropathy Symptoms affect only one area of the body. Common symptoms include:  Numbness.  Tingling.  Burning pain.  Prickling feeling.  Very sensitive skin.  Weakness.  Inability to move (paralysis).  Muscle twitching.  Muscles getting smaller (wasting).  Poor coordination.  Double or blurred vision. Proximal neuropathy  Sudden, severe pain in the hip, thigh, or buttocks. Pain may spread from the back into the legs (sciatica).  Pain and numbness in the arms and legs.  Tingling.  Loss of bladder control or bowel control.  Weakness and wasting of thigh muscles.  Difficulty getting up from a seated position.  Abdominal swelling.  Unexplained weight loss. How is this diagnosed? Diagnosis usually involves reviewing your medical history and any symptoms you have. Diagnosis varies depending on the type of  neuropathy your health care provider suspects. Peripheral neuropathy Your health care provider will check areas that are affected by your nervous system (neurologic exam), such as your reflexes, how you move, and what you can feel. You may have other tests, such as:  Blood tests.  Removal and examination of fluid that surrounds the spinal cord (lumbar puncture).  CT scan.  MRI.  A test to check the nerves that control muscles (electromyogram, EMG).  Tests of how quickly messages pass through your nerves (nerve conduction velocity tests).  Removal of a small piece of nerve to be examined under a microscope (biopsy). Autonomic neuropathy You may have tests, such as:  Tests to measure your blood pressure and heart rate. This may include monitoring you while you are safely secured to an exam table that moves you from a lying position to an upright position (table tilt test).  Breathing tests to check your lungs.  Tests to check how food moves through the digestive system (gastric emptying tests).  Blood, sweat, or urine tests.  Ultrasound of your bladder.  Spinal fluid tests. Focal neuropathy This condition may be diagnosed with:  A neurologic exam.  CT scan.  MRI.  EMG.  Nerve conduction velocity tests. Proximal neuropathy There is no test to diagnose this type of neuropathy. You may have tests to rule out other possible causes of this type of neuropathy. Tests may include:  X-rays of your spine and lumbar region.  Lumbar puncture.  MRI. How is this treated? The goal of treatment is to keep nerve damage from getting worse. The most important part of treatment is keeping your blood glucose level and your A1C level within your target range by following your diabetes management plan. Over time, maintaining lower blood glucose levels helps lessen symptoms. In some cases, you may need prescription pain medicine. Follow these instructions at home:  Lifestyle   Do not  use any products that contain nicotine or tobacco, such as cigarettes and e-cigarettes. If you need help quitting, ask your health care provider.  Be physically active every day. Include strength training and balance exercises.  Follow a healthy meal plan.  Work with your health care provider to manage your blood pressure. General instructions  Follow your diabetes management plan as directed. ? Check your blood glucose levels as directed by your health care provider. ? Keep your blood glucose in your target range as directed by your health care provider. ? Have your A1C level checked at least two times a year, or as often as told by your health care provider.  Take over the counter and prescription medicines only as told by your health care provider. This includes insulin and diabetes medicine.  Do not drive or use heavy machinery while taking prescription pain medicines.  Check your skin and feet every day for cuts, bruises, redness, blisters, or sores.  Keep all follow up visits as told by your health care provider. This is important. Contact a health care provider if:  You have burning, stabbing, or aching pain in your legs or feet.  You are unable to feel pressure or pain in your feet.  You develop problems with digestion, such as: ? Nausea. ? Vomiting. ? Bloating. ? Constipation. ? Diarrhea. ? Abdominal pain.  You have difficulty with urination, such as inability: ? To control when you urinate (incontinence). ? To completely empty the bladder (retention).  You have palpitations.  You feel dizzy, weak, or faint when you stand up. Get help right away if:  You cannot urinate.  You have sudden weakness or loss of coordination.  You have trouble speaking.  You have pain or pressure in your chest.  You have an irregular heart beat.  You have sudden inability to move a part of your body. Summary  Diabetic neuropathy refers to nerve damage that is caused by  diabetes. It can affect nerves throughout the entire body, causing numbness and pain in the arms, legs, digestive tract, heart, and other body systems.  Keep your blood glucose level and your blood pressure in your target range, as directed by your health care provider. This can help prevent neuropathy from getting worse.  Check your skin and feet every day for cuts, bruises, redness, blisters, or sores.  Do not use any products that contain nicotine or tobacco, such as cigarettes and e-cigarettes. If you need help quitting, ask your health care provider. This information is not intended to replace advice given to you by your health care provider. Make sure you discuss any questions you have  with your health care provider. Document Released: 01/11/2002 Document Revised: 12/15/2017 Document Reviewed: 12/07/2016 Elsevier Patient Education  2020 Elsevier Inc.      Agustina Caroli, MD [facility]

## 2019-09-11 MED FILL — PANTOPRAZOLE SOD DR 40 MG T: 40 | 30 days supply | Qty: 30 | Fill #3

## 2019-10-05 ENCOUNTER — Other Ambulatory Visit: Payer: Self-pay | Admitting: Family Medicine

## 2019-10-05 MED FILL — TAMSULOSIN HCL 0.4 MG CAP: 0.4 | 60 days supply | Qty: 60 | Fill #0

## 2019-10-23 ENCOUNTER — Other Ambulatory Visit: Payer: Self-pay | Admitting: Cardiovascular Disease

## 2019-10-23 MED FILL — PANTOPRAZOLE SOD DR 40 MG T: 40 | 30 days supply | Qty: 30 | Fill #0

## 2019-11-20 MED FILL — PANTOPRAZOLE SOD DR 40 MG T: 40 | 30 days supply | Qty: 30 | Fill #1

## 2019-11-30 ENCOUNTER — Ambulatory Visit: Payer: Medicare Other | Admitting: Family Medicine

## 2019-12-08 ENCOUNTER — Ambulatory Visit (INDEPENDENT_AMBULATORY_CARE_PROVIDER_SITE_OTHER): Payer: Medicare Other | Admitting: Family Medicine

## 2019-12-08 ENCOUNTER — Encounter: Payer: Self-pay | Admitting: Family Medicine

## 2019-12-08 ENCOUNTER — Other Ambulatory Visit (HOSPITAL_COMMUNITY)
Admission: RE | Admit: 2019-12-08 | Discharge: 2019-12-08 | Disposition: A | Discharge status: Home / Self Care | Payer: Medicare Other | Source: Ambulatory Visit | Attending: Family Medicine | Admitting: Family Medicine

## 2019-12-08 ENCOUNTER — Other Ambulatory Visit: Payer: Self-pay

## 2019-12-08 VITALS — BP 108/69 | HR 71 | Temp 97.3°F | Resp 17 | Ht 69.0 in | Wt 220.6 lb

## 2019-12-08 DIAGNOSIS — I9589 Other hypotension: Secondary | ICD-10-CM | POA: Diagnosis not present

## 2019-12-08 DIAGNOSIS — I10 Essential (primary) hypertension: Secondary | ICD-10-CM

## 2019-12-08 DIAGNOSIS — E1142 Type 2 diabetes mellitus with diabetic polyneuropathy: Secondary | ICD-10-CM

## 2019-12-08 DIAGNOSIS — Z113 Encounter for screening for infections with a predominantly sexual mode of transmission: Secondary | ICD-10-CM | POA: Insufficient documentation

## 2019-12-08 DIAGNOSIS — E785 Hyperlipidemia, unspecified: Secondary | ICD-10-CM

## 2019-12-08 NOTE — Patient Instructions (Addendum)
Due to your history of Diabetes you qualify to get the vaccine during this cycle.   COVID-19 Vaccine Information can be found at: [email or web address] For questions related to vaccine distribution or appointments, please email [email or web address] or call [phone].      If you have lab work done today you will be contacted with your lab results within the next 2 weeks.  If you have not heard from [place] then please contact us. The fastest way to get your results is to register for My Chart.   IF you received an x-ray today, you will receive an invoice from [facility]. Please contact [facility] at [phone] with questions or concerns regarding your invoice.   IF you received labwork today, you will receive an invoice from [place]. Please contact [facility] at [phone] with questions or concerns regarding your invoice.   Our billing staff will not be able to assist you with questions regarding bills from these companies.  You will be contacted with the lab results as soon as they are available. The fastest way to get your results is to activate your My Chart account. Instructions are located on the last page of this paperwork. If you have not heard from [place] regarding the results in 2 weeks, please contact this office.

## 2019-12-08 NOTE — Progress Notes (Signed)
Established Patient Office Visit  Subjective:  Patient ID: Steven Pastorino., male    DOB: 02/08/1956  Age: 64 y.o. MRN: [id number]  CC:  Chief Complaint  Patient presents with  . chronic medical conditions    3  month f/u    HPI Steven Hale. presents for   Hypertension He reports that he is taking taking all his medications He states that his blood pressures have been good He is taking lisinopril 2.'5mg'$  BP Readings from Last 3 Encounters:  12/08/19 108/69  08/29/19 115/71  06/27/19 118/68    Obesity He goes to [employer] and is exercising He is taking taking supplements to help his work out that is a protein powder. He takes his blood pressure and sometimes it is low which affe Wt Readings from Last 3 Encounters:  12/08/19 220 lb 9.6 oz (100.1 kg)  08/29/19 230 lb 6.4 oz (104.5 kg)  06/27/19 226 lb 6.4 oz (102.7 kg)    Diabetes and Diabetic Neuropathy He reports that at night he couldn't even walk because of pins and needles and burning sensations He is taking his lyrica '150mg'$  bid He states that he uses ice packs and heat packs  Gabapentin did not work so he was switched to lyrica which was helping initially   Past Medical History:  Diagnosis Date  . Anemia, unspecified   . Bell's palsy   . Blood transfusion    during treatment for Ca  . Blood transfusion without reported diagnosis   . CAD (coronary artery disease)    a. s/p multiple PCIs;  b. s/p CABG in 09/2010 (LIMA-LAD, SVG-OM1, SVG-distal RCA/OM2);  Cardiac cath in 12/2015: Mild LAD disease, occluded mid LCX and proximal RCA (left to right collaterals), Occluded SVG to RCA and atretic LIMA. Patent SVG to OM  . Chronic back pain   . Diabetes mellitus without complication ([place])   . DJD (degenerative joint disease)    low back & all over   . GERD (gastroesophageal reflux disease)   . Helicobacter pylori (H. pylori) infection   . Hemorrhoids   . Hyperlipidemia   . Hypertension   . Impotence of  organic origin   . Myocardial infarction ([place])    2005 and 2012   . PAD (peripheral artery disease) ([place])    a. s/p bilat SFA stents;  b. ABIs 11/2012: R 0.99, L 0.86.  [name] Pancreatic cancer [facility])    surgery 2009  . Pituitary adenoma [facility])    surgery at [facility] Feb 2020  . Pre-diabetes 08/16/2015  . Sleep apnea 06/05/2019    Past Surgical History:  Procedure Laterality Date  . ABDOMINAL AORTAGRAM N/A 09/13/2013   Procedure: ABDOMINAL Maxcine Ham;  Surgeon: Wellington Hampshire, MD;  Location: [place] CATH LAB;  Service: Cardiovascular;  Laterality: N/A;  . ABDOMINAL AORTAGRAM N/A 08/29/2014   Procedure: ABDOMINAL Maxcine Ham;  Surgeon: Wellington Hampshire, MD;  Location: [place] CATH LAB;  Service: Cardiovascular;  Laterality: N/A;  . BACK SURGERY     1992  . CARDIAC CATHETERIZATION N/A 12/25/2015   Procedure: Left Heart Cath and Cors/Grafts Angiography;  Surgeon: Wellington Hampshire, MD;  Location: [place] CV LAB;  Service: Cardiovascular;  Laterality: N/A;  . CORONARY ARTERY BYPASS GRAFT  nov 2011   x 4  . ESOPHAGOGASTRODUODENOSCOPY (EGD) WITH PROPOFOL N/A 04/02/2017   Procedure: ESOPHAGOGASTRODUODENOSCOPY (EGD) WITH PROPOFOL;  Surgeon: Carol Ada, MD;  [facility] ENDOS[facility];  Service: Endoscopy;  Laterality: N/A;  . HEMORRHOID SURGERY  10/30/2011  Procedure: HEMORRHOIDECTOMY;  Surgeon: Harl Bowie, MD;  Location: [place];  Service: General;  Laterality: N/A;  . JOINT REPLACEMENT    . pancreatic  cancer  05/10/2008   Resection of distal panrease and spleen  . PARTIAL KNEE ARTHROPLASTY Right 11/19/2014   Procedure: RIGHT UNI-KNEE ARTHROPLASTY MEDIALLY;  Surgeon: Mauri Pole, MD;  [facility] ORS;  Service: Orthopedics;  Laterality: Right;  . PERIPHERAL VASCULAR CATHETERIZATION  Oct 2014   Lt SFA PTA  . PERIPHERAL VASCULAR CATHETERIZATION  Oct 2015   ISR Lt SFA-PTA  . right partial knee replacement    . splenectomy    . THROMBECTOMY FEMORAL ARTERY Left 09/13/2013   Procedure: THROMBECTOMY FEMORAL  ARTERY;  Surgeon: Wellington Hampshire, MD;  Location: [place] CATH LAB;  Service: Cardiovascular;  Laterality: Left;  . TRANSPHENOIDAL / TRANSNASAL HYPOPHYSECTOMY / RESECTION PITUITARY TUMOR  12/2018   [facility]    Family History  Problem Relation Age of Onset  . Heart attack Father        died of MI at age 57  . Cancer Father   . Anesthesia problems Neg Hx   . Hypotension Neg Hx   . Malignant hyperthermia Neg Hx   . Pseudochol deficiency Neg Hx   . Colon cancer Neg Hx   . Esophageal cancer Neg Hx   . Prostate cancer Neg Hx   . Rectal cancer Neg Hx   . Stomach cancer Neg Hx     Social History   Socioeconomic History  . Marital status: Single    Spouse name: Not on file  . Number of children: 0  . Years of education: Not on file  . Highest education level: Not on file  Occupational History  . Occupation: TEFL teacher: UNEMPLOYED  Tobacco Use  . Smoking status: Former Smoker    Types: Cigarettes    Quit date: 11/29/2008    Years since quitting: 11.0  . Smokeless tobacco: Never Used  Substance and Sexual Activity  . Alcohol use: Yes    Alcohol/week: 3.0 standard drinks    Types: 3 Shots of liquor per week    Comment: 2 drinks of brandy every week   . Drug use: No  . Sexual activity: Not Currently  Other Topics Concern  . Not on file  Social History Narrative   He works at the night shift at [employer] part time   [employer] [phone]   Single, no children   Social Determinants of Radio broadcast assistant Strain:   . Difficulty of Paying Living Expenses: Not on file  Food Insecurity:   . Worried About Charity fundraiser in the Last Year: Not on file  . Ran Out of Food in the Last Year: Not on file  Transportation Needs:   . Lack of Transportation (Medical): Not on file  . Lack of Transportation (Non-Medical): Not on file  Physical Activity:   . Days of Exercise per Week: Not on file  . Minutes of Exercise per Session: Not on file  Stress:   . Feeling of  Stress : Not on file  Social Connections:   . Frequency of Communication with Friends and Family: Not on file  . Frequency of Social Gatherings with Friends and Family: Not on file  . Attends Religious Services: Not on file  . Active Member of Clubs or Organizations: Not on file  . Attends Archivist Meetings: Not on file  . Marital Status: Not  on file  Intimate Partner Violence:   . Fear of Current or Ex-Partner: Not on file  . Emotionally Abused: Not on file  . Physically Abused: Not on file  . Sexually Abused: Not on file    Outpatient Medications Prior to Visit  Medication Sig Dispense Refill  . aspirin EC 81 MG tablet Take 1 tablet (81 mg total) by mouth daily.    [name] atorvastatin (LIPITOR) 40 MG tablet Take 1 tablet (40 mg total) by mouth daily. (Patient taking differently: Take 40 mg by mouth at bedtime. ) 90 tablet 3  . fish oil-omega-3 fatty acids 1000 MG capsule Take 1 capsule (1 g total) by mouth daily. 60 capsule 1  . metFORMIN (GLUCOPHAGE) 500 MG tablet Take 1 tablet (500 mg total) by mouth daily with breakfast. 90 tablet 3  . metoprolol tartrate (LOPRESSOR) 25 MG tablet Take 1 tablet by mouth two  times daily 60 tablet 0  . Multiple Vitamin (MULTIVITAMIN) tablet Take 1 tablet by mouth daily. 30 tablet 10  . pantoprazole (PROTONIX) 40 MG tablet TAKE 1 TABLET BY MOUTH ONCE DAILY 30 tablet 2  . pregabalin (LYRICA) 150 MG capsule Take 1 capsule (150 mg total) by mouth daily. 30 capsule 0  . sildenafil (VIAGRA) 100 MG tablet Take 0.5-1 tablets (50-100 mg total) by mouth daily as needed for erectile dysfunction. 5 tablet 11  . tamsulosin (FLOMAX) 0.4 MG CAPS capsule TAKE 1 CAPSULE BY MOUTH DAILY. 60 capsule 0  . lisinopril (PRINIVIL,ZESTRIL) 5 MG tablet TAKE 0.5 TABLETS BY MOUTH  DAILY. 15 tablet 0   No facility-administered medications prior to visit.    No Known Allergies  ROS Review of Systems Review of Systems  Constitutional: Negative for activity change,  appetite change, chills and fever.  HENT: Negative for congestion, nosebleeds, trouble swallowing and voice change.   Respiratory: Negative for cough, shortness of breath and wheezing.   Gastrointestinal: Negative for diarrhea, nausea and vomiting.  Genitourinary: Negative for difficulty urinating, dysuria, flank pain and hematuria.  Musculoskeletal: Negative for back pain, joint swelling and neck pain.  Neurological: +dizziness, no speech difficulty, light-headedness and numbness.  See HPI. All other review of systems negative.     Objective:    Physical Exam  BP 108/69 (BP Location: Left Arm, Patient Position: Sitting, Cuff Size: Large)   Pulse 71   Temp (!) 97.3 F (36.3 C) (Oral)   Resp 17   Ht '5\' 9"'$  (1.753 m)   Wt 220 lb 9.6 oz (100.1 kg)   SpO2 95%   BMI 32.58 kg/m  Wt Readings from Last 3 Encounters:  12/08/19 220 lb 9.6 oz (100.1 kg)  08/29/19 230 lb 6.4 oz (104.5 kg)  06/27/19 226 lb 6.4 oz (102.7 kg)    Physical Exam  Constitutional: Oriented to person, place, and time. Appears well-developed and well-nourished.  HENT:  Head: Normocephalic and atraumatic.  Eyes: Conjunctivae and EOM are normal.  Cardiovascular: Normal rate, regular rhythm, normal heart sounds and intact distal pulses.  No murmur heard. Pulmonary/Chest: Effort normal and breath sounds normal. No stridor. No respiratory distress. Has no wheezes.  Neurological: Is alert and oriented to person, place, and time.  Skin: Skin is warm. Capillary refill takes less than 2 seconds.  Psychiatric: Has a normal mood and affect. Behavior is normal. Judgment and thought content normal.    Health Maintenance Due  Topic Date Due  . URINE MICROALBUMIN  05/20/2017  . OPHTHALMOLOGY EXAM  10/20/2019    There are  no preventive care reminders to display for this patient.  Lab Results  Component Value Date   TSH 1.420 11/11/2017   Lab Results  Component Value Date   WBC 4.6 03/21/2019   HGB 12.3 (L)  03/21/2019   HCT 36.2 (L) 03/21/2019   MCV 87 03/21/2019   PLT 346 03/21/2019   Lab Results  Component Value Date   NA 140 12/08/2019   K 5.3 (H) 12/08/2019   CO2 26 12/08/2019   GLUCOSE 119 (H) 12/08/2019   BUN 8 12/08/2019   CREATININE 1.01 12/08/2019   BILITOT 0.5 12/08/2019   ALKPHOS 98 12/08/2019   AST 23 12/08/2019   ALT 24 12/08/2019   PROT 7.8 12/08/2019   ALBUMIN 5.2 (H) 12/08/2019   CALCIUM 10.8 (H) 12/08/2019   ANIONGAP 12 12/25/2018   GFR 88.28 08/21/2014   Lab Results  Component Value Date   CHOL 158 12/08/2019   Lab Results  Component Value Date   HDL 42 12/08/2019   Lab Results  Component Value Date   LDLCALC 89 12/08/2019   Lab Results  Component Value Date   TRIG 153 (H) 12/08/2019   Lab Results  Component Value Date   CHOLHDL 3.8 12/08/2019   Lab Results  Component Value Date   HGBA1C 6.9 (H) 12/08/2019      Assessment & Plan:   Problem List Items Addressed This Visit      Cardiovascular and Mediastinum   Essential hypertension - very well controlled Beginning to have hypotension Decreased lisinopril dose     Other   Dyslipidemia, goal LDL below 70  - will assess    Relevant Orders   Lipid panel (Completed)    Other Visit Diagnoses    Type 2 diabetes mellitus with peripheral neuropathy ([place])    -  Primary  well controlled hemoglobin a1c is at goal Continue exercise Lipids monitored and renal function in range On metformin On ace inhibitor lisinopril On asa '81mg'$  Reviewed diabetic foot care Emphasized importance of eye and dental exam      Relevant Orders   HM Diabetes Foot Exam (Completed)   Hemoglobin A1c (Completed)   CMP14+EGFR (Completed)   Lipid panel (Completed)   Iatrogenic hypotension    -  Will decrease dose of lisinopril    Screen for STD (sexually transmitted disease)       Relevant Orders   HIV Antibody (routine testing w rflx)   GC/Chlamydia probe amp ([place])not at [facility]   Hepatitis B surface  antigen   RPR      No orders of the defined types were placed in this encounter.   Follow-up: Return in about 6 months (around 06/06/2020) for diabetes.    [name], MD

## 2019-12-09 LAB — CMP14+EGFR
ALT: 24 IU/L (ref 0–44)
AST: 23 IU/L (ref 0–40)
Albumin/Globulin Ratio: 2 (ref 1.2–2.2)
Albumin: 5.2 g/dL — ABNORMAL HIGH (ref 3.8–4.8)
Alkaline Phosphatase: 98 IU/L (ref 39–117)
BUN/Creatinine Ratio: 8 — ABNORMAL LOW (ref 10–24)
BUN: 8 mg/dL (ref 8–27)
Bilirubin Total: 0.5 mg/dL (ref 0.0–1.2)
CO2: 26 mmol/L (ref 20–29)
Calcium: 10.8 mg/dL — ABNORMAL HIGH (ref 8.6–10.2)
Chloride: 100 mmol/L (ref 96–106)
Creatinine, Ser: 1.01 mg/dL (ref 0.76–1.27)
GFR calc Af Amer: 91 mL/min/{1.73_m2} (ref >59)
GFR calc non Af Amer: 79 mL/min/{1.73_m2} (ref >59)
Globulin, Total: 2.6 g/dL (ref 1.5–4.5)
Glucose: 119 mg/dL — ABNORMAL HIGH (ref 65–99)
Potassium: 5.3 mmol/L — ABNORMAL HIGH (ref 3.5–5.2)
Sodium: 140 mmol/L (ref 134–144)
Total Protein: 7.8 g/dL (ref 6.0–8.5)

## 2019-12-09 LAB — HEMOGLOBIN A1C
Est. average glucose Bld gHb Est-mCnc: 151 mg/dL
Hgb A1c MFr Bld: 6.9 % — ABNORMAL HIGH (ref 4.8–5.6)

## 2019-12-09 LAB — HIV ANTIBODY (ROUTINE TESTING W REFLEX): HIV Screen 4th Generation wRfx: NONREACTIVE

## 2019-12-09 LAB — LIPID PANEL
Chol/HDL Ratio: 3.8 ratio (ref 0.0–5.0)
Cholesterol, Total: 158 mg/dL (ref 100–199)
HDL: 42 mg/dL (ref >39)
LDL Chol Calc (NIH): 89 mg/dL (ref 0–99)
Triglycerides: 153 mg/dL — ABNORMAL HIGH (ref 0–149)
VLDL Cholesterol Cal: 27 mg/dL (ref 5–40)

## 2019-12-09 LAB — RPR: RPR Ser Ql: NONREACTIVE

## 2019-12-09 LAB — HEPATITIS B SURFACE ANTIGEN: Hepatitis B Surface Ag: NEGATIVE

## 2019-12-11 LAB — GC/CHLAMYDIA PROBE AMP (~~LOC~~) NOT AT ARMC
Chlamydia: NEGATIVE
Comment: NEGATIVE
Comment: NORMAL
Neisseria Gonorrhea: NEGATIVE

## 2019-12-12 ENCOUNTER — Other Ambulatory Visit: Payer: Self-pay | Admitting: Family Medicine

## 2019-12-12 DIAGNOSIS — E875 Hyperkalemia: Secondary | ICD-10-CM

## 2019-12-14 ENCOUNTER — Other Ambulatory Visit: Payer: Self-pay | Admitting: Family Medicine

## 2019-12-14 MED FILL — TAMSULOSIN HCL 0.4 MG CAP: 0.4 | 60 days supply | Qty: 60 | Fill #0

## 2019-12-21 ENCOUNTER — Other Ambulatory Visit: Payer: Self-pay

## 2019-12-21 ENCOUNTER — Ambulatory Visit (INDEPENDENT_AMBULATORY_CARE_PROVIDER_SITE_OTHER): Payer: Medicare Other | Admitting: Adult Health Nurse Practitioner

## 2019-12-21 VITALS — BP 126/77 | HR 75 | Temp 97.8°F | Ht 69.0 in | Wt 224.0 lb

## 2019-12-21 DIAGNOSIS — K1370 Unspecified lesions of oral mucosa: Secondary | ICD-10-CM | POA: Diagnosis not present

## 2019-12-21 DIAGNOSIS — S0993XA Unspecified injury of face, initial encounter: Secondary | ICD-10-CM

## 2019-12-21 MED ORDER — CHLORHEXIDINE GLUCONATE 0.12 % MT SOLN: 15.0000 mL | Freq: Two times a day (BID) | OROMUCOSAL | 0 refills | Status: DC

## 2019-12-21 MED FILL — CHLORHEXIDINE 0.12% RINSE: 0.12 | 14 days supply | Qty: 473 | Fill #0

## 2019-12-21 NOTE — Patient Instructions (Signed)
° ° ° °  If you have lab work done today you will be contacted with your lab results within the next 2 weeks.  If you have not heard from us then please contact us. The fastest way to get your results is to register for My Chart. ° ° °IF you received an x-ray today, you will receive an invoice from [facility]. Please contact [facility] at [phone] with questions or concerns regarding your invoice.  ° °IF you received labwork today, you will receive an invoice from [facility]. Please contact [facility] at [phone] with questions or concerns regarding your invoice.  ° °Our billing staff will not be able to assist you with questions regarding bills from these companies. ° °You will be contacted with the lab results as soon as they are available. The fastest way to get your results is to activate your My Chart account. Instructions are located on the last page of this paperwork. If you have not heard from us regarding the results in 2 weeks, please contact this office. °  ° ° ° °

## 2019-12-25 MED FILL — PANTOPRAZOLE SOD DR 40 MG T: 40 | 30 days supply | Qty: 30 | Fill #2

## 2019-12-26 ENCOUNTER — Telehealth: Payer: Self-pay | Admitting: Adult Health Nurse Practitioner

## 2019-12-26 NOTE — Telephone Encounter (Signed)
Please close notes from 12/21/2019.

## 2019-12-26 NOTE — Telephone Encounter (Signed)
Please have provider close the 12/21/2019 note.  We cannot process the referral without the note being closed.

## 2019-12-28 ENCOUNTER — Encounter: Payer: Self-pay | Admitting: Adult Health Nurse Practitioner

## 2019-12-28 NOTE — Progress Notes (Signed)
History   Chief Complaint  Patient presents with  . bite on tongue    Bump on tongue that has gotten larger that has been present for the past month or longer    HPI   Patient has a bump on his tongue on the right which is really a piece of skin that keeps getting irritated by teeth and mouth closing.  He has been to a dentist recently and they told him it would not go away unless it was removed.  He has not looked into it.   Past Medical History:  Diagnosis Date  . Anemia, unspecified   . Bell's palsy   . Blood transfusion    during treatment for Ca  . Blood transfusion without reported diagnosis   . CAD (coronary artery disease)    a. s/p multiple PCIs;  b. s/p CABG in 09/2010 (LIMA-LAD, SVG-OM1, SVG-distal RCA/OM2);  Cardiac cath in 12/2015: Mild LAD disease, occluded mid LCX and proximal RCA (left to right collaterals), Occluded SVG to RCA and atretic LIMA. Patent SVG to OM  . Chronic back pain   . Diabetes mellitus without complication ([place])   . DJD (degenerative joint disease)    low back & all over   . GERD (gastroesophageal reflux disease)   . Helicobacter pylori (H. pylori) infection   . Hemorrhoids   . Hyperlipidemia   . Hypertension   . Impotence of organic origin   . Myocardial infarction ([place])    2005 and 2012   . PAD (peripheral artery disease) ([place])    a. s/p bilat SFA stents;  b. ABIs 11/2012: R 0.99, L 0.86.  [name] Pancreatic cancer [facility])    surgery 2009  . Pituitary adenoma [facility])    surgery at [facility] Feb 2020  . Pre-diabetes 08/16/2015  . Sleep apnea 06/05/2019   Past Surgical History:  Procedure Laterality Date  . ABDOMINAL AORTAGRAM N/A 09/13/2013   Procedure: ABDOMINAL Maxcine Ham;  Surgeon: Wellington Hampshire, MD;  Location: [place] CATH LAB;  Service: Cardiovascular;  Laterality: N/A;  . ABDOMINAL AORTAGRAM N/A 08/29/2014   Procedure: ABDOMINAL Maxcine Ham;  Surgeon: Wellington Hampshire, MD;  Location: [place] CATH LAB;  Service: Cardiovascular;  Laterality: N/A;  . BACK  SURGERY     1992  . CARDIAC CATHETERIZATION N/A 12/25/2015   Procedure: Left Heart Cath and Cors/Grafts Angiography;  Surgeon: Wellington Hampshire, MD;  Location: [place] CV LAB;  Service: Cardiovascular;  Laterality: N/A;  . CORONARY ARTERY BYPASS GRAFT  nov 2011   x 4  . ESOPHAGOGASTRODUODENOSCOPY (EGD) WITH PROPOFOL N/A 04/02/2017   Procedure: ESOPHAGOGASTRODUODENOSCOPY (EGD) WITH PROPOFOL;  Surgeon: Carol Ada, MD;  [facility];  Service: Endoscopy;  Laterality: N/A;  . HEMORRHOID SURGERY  10/30/2011   Procedure: HEMORRHOIDECTOMY;  Surgeon: Harl Bowie, MD;  Location: [place];  Service: General;  Laterality: N/A;  . JOINT REPLACEMENT    . pancreatic  cancer  05/10/2008   Resection of distal panrease and spleen  . PARTIAL KNEE ARTHROPLASTY Right 11/19/2014   Procedure: RIGHT UNI-KNEE ARTHROPLASTY MEDIALLY;  Surgeon: Mauri Pole, MD;  [facility];  Service: Orthopedics;  Laterality: Right;  . PERIPHERAL VASCULAR CATHETERIZATION  Oct 2014   Lt SFA PTA  . PERIPHERAL VASCULAR CATHETERIZATION  Oct 2015   ISR Lt SFA-PTA  . right partial knee replacement    . splenectomy    . THROMBECTOMY FEMORAL ARTERY Left 09/13/2013   Procedure: THROMBECTOMY FEMORAL ARTERY;  Surgeon: Wellington Hampshire, MD;  Location: [place] CATH LAB;  Service: Cardiovascular;  Laterality: Left;  . TRANSPHENOIDAL / TRANSNASAL HYPOPHYSECTOMY / RESECTION PITUITARY TUMOR  12/2018   [facility]   Family History  Problem Relation Age of Onset  . Heart attack Father        died of MI at age 49  . Cancer Father   . Anesthesia problems Neg Hx   . Hypotension Neg Hx   . Malignant hyperthermia Neg Hx   . Pseudochol deficiency Neg Hx   . Colon cancer Neg Hx   . Esophageal cancer Neg Hx   . Prostate cancer Neg Hx   . Rectal cancer Neg Hx   . Stomach cancer Neg Hx    Social History   Tobacco Use  . Smoking status: Former Smoker    Types: Cigarettes    Quit date: 11/29/2008    Years since quitting: 11.0  .  Smokeless tobacco: Never Used  Substance Use Topics  . Alcohol use: Yes    Alcohol/week: 3.0 standard drinks    Types: 3 Shots of liquor per week    Comment: 2 drinks of brandy every week   . Drug use: No    Review of Systems   + for bump on tongue and mouth irritation, no other pertinent positives.   Allergies   Patient has no known allergies.  Home Medications    Current Outpatient Medications:  .  aspirin EC 81 MG tablet, Take 1 tablet (81 mg total) by mouth daily., Disp: , Rfl:  .  atorvastatin (LIPITOR) 40 MG tablet, Take 1 tablet (40 mg total) by mouth daily. (Patient taking differently: Take 40 mg by mouth at bedtime. ), Disp: 90 tablet, Rfl: 3 .  fish oil-omega-3 fatty acids 1000 MG capsule, Take 1 capsule (1 g total) by mouth daily., Disp: 60 capsule, Rfl: 1 .  metFORMIN (GLUCOPHAGE) 500 MG tablet, Take 1 tablet (500 mg total) by mouth daily with breakfast., Disp: 90 tablet, Rfl: 3 .  metoprolol tartrate (LOPRESSOR) 25 MG tablet, Take 1 tablet by mouth two  times daily, Disp: 60 tablet, Rfl: 0 .  Multiple Vitamin (MULTIVITAMIN) tablet, Take 1 tablet by mouth daily., Disp: 30 tablet, Rfl: 10 .  pantoprazole (PROTONIX) 40 MG tablet, TAKE 1 TABLET BY MOUTH ONCE DAILY, Disp: 30 tablet, Rfl: 2 .  pregabalin (LYRICA) 150 MG capsule, Take 1 capsule (150 mg total) by mouth daily., Disp: 30 capsule, Rfl: 0 .  sildenafil (VIAGRA) 100 MG tablet, Take 0.5-1 tablets (50-100 mg total) by mouth daily as needed for erectile dysfunction., Disp: 5 tablet, Rfl: 11 .  tamsulosin (FLOMAX) 0.4 MG CAPS capsule, TAKE 1 CAPSULE BY MOUTH ONCE DAILY, Disp: 60 capsule, Rfl: 0 .  chlorhexidine (PERIDEX) 0.12 % solution, Use as directed 15 mLs in the mouth or throat 2 (two) times daily., Disp: 120 mL, Rfl: 0  Meds Ordered and Administered this Visit   Meds ordered this encounter  Medications  . chlorhexidine (PERIDEX) 0.12 % solution    Sig: Use as directed 15 mLs in the mouth or throat 2 (two)  times daily.    Dispense:  120 mL    Refill:  0    BP 126/77 (BP Location: Right Arm, Patient Position: Sitting, Cuff Size: Normal)   Pulse 75   Temp 97.8 F (36.6 C) (Temporal)   Ht 5\' 9"  (1.753 m)   Wt 224 lb (101.6 kg)   SpO2 96%   BMI 33.08 kg/m   Physical Exam General appearance:  alert, well appearing, and in no distress, oriented to person, place, and time and anxious. ENT exam reveals - ENT exam normal, no neck nodes or sinus tenderness and tonsillar hypertrophy bilaterally at 3+, small dime size skin tag looking attachment to the right lateral tongue.. Chest: clear to auscultation, no wheezes, rales or rhonchi, symmetric air entry.  CVS exam: normal rate, regular rhythm, normal S1, S2, no murmurs, rubs, clicks or gallops. Skin exam - normal coloration and turgor, no rashes, no suspicious skin lesions noted. Mental Status: normal mood, behavior, speech, dress, motor activity, and thought processes, anxious.   MDM   1. Injury of tongue, initial encounter    Orders Placed This Encounter  Procedures  . Ambulatory referral to Oral Maxillofacial Surgery   Meds ordered this encounter  Medications  . chlorhexidine (PERIDEX) 0.12 % solution    Sig: Use as directed 15 mLs in the mouth or throat 2 (two) times daily.    Dispense:  120 mL    Refill:  0

## 2020-01-03 ENCOUNTER — Ambulatory Visit (INDEPENDENT_AMBULATORY_CARE_PROVIDER_SITE_OTHER): Payer: Medicare Other | Admitting: Family Medicine

## 2020-01-03 VITALS — BP 126/77 | Ht 69.0 in | Wt 224.0 lb

## 2020-01-03 DIAGNOSIS — Z0000 Encounter for general adult medical examination without abnormal findings: Secondary | ICD-10-CM

## 2020-01-03 NOTE — Progress Notes (Signed)
Presents today for [employer] Visit   Date of last exam: 12/08/2019  Interpreter used for this visit? No  I connected with  Steven Hale. on 01/03/20 by a telephone  and verified that I am speaking with the correct person using two identifiers.   I discussed the limitations of evaluation and management by telemedicine. The patient expressed understanding and agreed to proceed.    Patient Care Team: [name], MD as PCP - General (Internal Medicine) Lorretta Harp, MD as PCP - Cardiology (Cardiology) Copland, Gay Filler, MD as Consulting Physician [facility])   Other items to address today:   Discussed Eye/Dental Discussed Immunizations Follow up scheduled with Dr. Nolon Rod 2/22 @  8;40 am   Other Screening: Last screening for diabetes: 12/08/2019 Last lipid screening: 12/08/2019  ADVANCE DIRECTIVES: Discussed: YES On File: NO Materials Provided: YES  Immunization status:  Immunization History  Administered Date(s) Administered  . Influenza Split 08/30/2013  . Influenza Whole 08/22/2008, 09/23/2009  . Influenza,inj,Quad PF,6+ Mos 08/13/2015, 08/15/2018  . Pneumococcal Conjugate-13 12/10/2014  . Pneumococcal Polysaccharide-23 11/25/2017  . Td 04/16/2008     Health Maintenance Due  Topic Date Due  . URINE MICROALBUMIN  05/20/2017     Functional Status Survey: Is the patient deaf or have difficulty hearing?: No Does the patient have difficulty seeing, even when wearing glasses/contacts?: No Does the patient have difficulty concentrating, remembering, or making decisions?: No Does the patient have difficulty walking or climbing stairs?: No Does the patient have difficulty dressing or bathing?: No Does the patient have difficulty doing errands alone such as visiting a doctor's office or shopping?: No   6CIT Screen 01/03/2020  What Year? 0 points  What month? 0 points  What time? 0 points  Count back from 20 0 points    Months in reverse 0 points  Repeat phrase 0 points  Total Score 0        Clinical Support from 01/03/2020 in [facility] [place]  AUDIT-C Score  0       Home Environment:   Lives in a two story home No trouble climbing stairs No scattered rugs No grab bars Adequate lighting/no clutter  Timed Warm up  N/A   Patient Active Problem List   Diagnosis Date Noted  . Sleep apnea 06/05/2019  . Coronary artery disease involving coronary bypass graft of native heart   . Pre-diabetes 08/16/2015  . Peripheral neuropathy 07/09/2015  . Other male erectile dysfunction 03/07/2015  . Essential hypertension, benign 03/07/2015  . Pure hypercholesterolemia 12/10/2014  . Essential hypertension 12/10/2014  . S/P selective transsphenoidal pituitary adenomectomy 11/19/2014  . Lumbar disc herniation with radiculopathy 10/15/2014  . Chronic back pain 10/12/2014  . H/O pancreatic cancer 2009 10/12/2014  . Iron deficiency anemia 06/11/2014  . Testosterone deficiency 09/07/2013  . Benign paroxysmal positional vertigo 08/30/2013  . Non-insulin dependent type 2 diabetes mellitus ([place]) 08/30/2013  . PAD (peripheral artery disease) ([place]) 06/02/2012  . Claudication ([place]) 05/14/2011  . PVD (peripheral vascular disease) ([place]) 01/08/2009  . BELL'S PALSY, RIGHT 12/25/2008  . Dyslipidemia, goal LDL below 70 06/05/2008  . Hx of CABG 2011 11/17/2003     Past Medical History:  Diagnosis Date  . Anemia, unspecified   . Bell's palsy   . Blood transfusion    during treatment for Ca  . Blood transfusion without reported diagnosis   . CAD (coronary artery disease)    a. s/p multiple PCIs;  b.  s/p CABG in 09/2010 (LIMA-LAD, SVG-OM1, SVG-distal RCA/OM2);  Cardiac cath in 12/2015: Mild LAD disease, occluded mid LCX and proximal RCA (left to right collaterals), Occluded SVG to RCA and atretic LIMA. Patent SVG to OM  . Chronic back pain   . Diabetes mellitus without complication ([place])   . DJD  (degenerative joint disease)    low back & all over   . GERD (gastroesophageal reflux disease)   . Helicobacter pylori (H. pylori) infection   . Hemorrhoids   . Hyperlipidemia   . Hypertension   . Impotence of organic origin   . Myocardial infarction ([place])    2005 and 2012   . PAD (peripheral artery disease) ([place])    a. s/p bilat SFA stents;  b. ABIs 11/2012: R 0.99, L 0.86.  [name] Pancreatic cancer [facility])    surgery 2009  . Pituitary adenoma [facility])    surgery at [facility] Feb 2020  . Pre-diabetes 08/16/2015  . Sleep apnea 06/05/2019     Past Surgical History:  Procedure Laterality Date  . ABDOMINAL AORTAGRAM N/A 09/13/2013   Procedure: ABDOMINAL Maxcine Ham;  Surgeon: Wellington Hampshire, MD;  Location: [facility] CATH LAB;  Service: Cardiovascular;  Laterality: N/A;  . ABDOMINAL AORTAGRAM N/A 08/29/2014   Procedure: ABDOMINAL Maxcine Ham;  Surgeon: Wellington Hampshire, MD;  Location: [place] CATH LAB;  Service: Cardiovascular;  Laterality: N/A;  . BACK SURGERY     1992  . CARDIAC CATHETERIZATION N/A 12/25/2015   Procedure: Left Heart Cath and Cors/Grafts Angiography;  Surgeon: Wellington Hampshire, MD;  Location: [facility] CV LAB;  Service: Cardiovascular;  Laterality: N/A;  . CORONARY ARTERY BYPASS GRAFT  nov 2011   x 4  . ESOPHAGOGASTRODUODENOSCOPY (EGD) WITH PROPOFOL N/A 04/02/2017   Procedure: ESOPHAGOGASTRODUODENOSCOPY (EGD) WITH PROPOFOL;  Surgeon: Carol Ada, MD;  [facility] ENDOSCOPY;  Service: Endoscopy;  Laterality: N/A;  . HEMORRHOID SURGERY  10/30/2011   Procedure: HEMORRHOIDECTOMY;  Surgeon: Harl Bowie, MD;  Location: [facility];  Service: General;  Laterality: N/A;  . JOINT REPLACEMENT    . pancreatic  cancer  05/10/2008   Resection of distal panrease and spleen  . PARTIAL KNEE ARTHROPLASTY Right 11/19/2014   Procedure: RIGHT UNI-KNEE ARTHROPLASTY MEDIALLY;  Surgeon: Mauri Pole, MD;  [facility] ORS;  Service: Orthopedics;  Laterality: Right;  . PERIPHERAL VASCULAR CATHETERIZATION  Oct 2014    Lt SFA PTA  . PERIPHERAL VASCULAR CATHETERIZATION  Oct 2015   ISR Lt SFA-PTA  . right partial knee replacement    . splenectomy    . THROMBECTOMY FEMORAL ARTERY Left 09/13/2013   Procedure: THROMBECTOMY FEMORAL ARTERY;  Surgeon: Wellington Hampshire, MD;  Location: [place] CATH LAB;  Service: Cardiovascular;  Laterality: Left;  . TRANSPHENOIDAL / TRANSNASAL HYPOPHYSECTOMY / RESECTION PITUITARY TUMOR  12/2018   [facility]     Family History  Problem Relation Age of Onset  . Heart attack Father        died of MI at age 41  . Cancer Father   . Anesthesia problems Neg Hx   . Hypotension Neg Hx   . Malignant hyperthermia Neg Hx   . Pseudochol deficiency Neg Hx   . Colon cancer Neg Hx   . Esophageal cancer Neg Hx   . Prostate cancer Neg Hx   . Rectal cancer Neg Hx   . Stomach cancer Neg Hx      Social History   Socioeconomic History  . Marital status: Single    Spouse name: Not  on file  . Number of children: 0  . Years of education: Not on file  . Highest education level: Not on file  Occupational History  . Occupation: TEFL teacher: UNEMPLOYED  Tobacco Use  . Smoking status: Former Smoker    Types: Cigarettes    Quit date: 11/29/2008    Years since quitting: 11.1  . Smokeless tobacco: Never Used  Substance and Sexual Activity  . Alcohol use: Yes    Alcohol/week: 3.0 standard drinks    Types: 3 Shots of liquor per week    Comment: 2 drinks of brandy every week   . Drug use: No  . Sexual activity: Not Currently  Other Topics Concern  . Not on file  Social History Narrative   He works at the night shift at [employer] part time   [employer] [phone]   Single, no children   Social Determinants of Radio broadcast assistant Strain:   . Difficulty of Paying Living Expenses: Not on file  Food Insecurity:   . Worried About Charity fundraiser in the Last Year: Not on file  . Ran Out of Food in the Last Year: Not on file  Transportation Needs:   . Lack of  Transportation (Medical): Not on file  . Lack of Transportation (Non-Medical): Not on file  Physical Activity:   . Days of Exercise per Week: Not on file  . Minutes of Exercise per Session: Not on file  Stress:   . Feeling of Stress : Not on file  Social Connections:   . Frequency of Communication with Friends and Family: Not on file  . Frequency of Social Gatherings with Friends and Family: Not on file  . Attends Religious Services: Not on file  . Active Member of Clubs or Organizations: Not on file  . Attends Archivist Meetings: Not on file  . Marital Status: Not on file  Intimate Partner Violence:   . Fear of Current or Ex-Partner: Not on file  . Emotionally Abused: Not on file  . Physically Abused: Not on file  . Sexually Abused: Not on file     No Known Allergies   Prior to Admission medications   Medication Sig Start Date End Date Taking? Authorizing Provider  aspirin EC 81 MG tablet Take 1 tablet (81 mg total) by mouth daily. 11/19/14  Yes Babish, Rodman Key, PA-C  atorvastatin (LIPITOR) 40 MG tablet Take 1 tablet (40 mg total) by mouth daily. Patient taking differently: Take 40 mg by mouth at bedtime.  07/07/16  Yes Wellington Hampshire, MD  chlorhexidine (PERIDEX) 0.12 % solution Use as directed 15 mLs in the mouth or throat 2 (two) times daily. 12/21/19  Yes Wendall Mola, NP  fish oil-omega-3 fatty acids 1000 MG capsule Take 1 capsule (1 g total) by mouth daily. 11/30/12  Yes Rosana Hoes, MD  metFORMIN (GLUCOPHAGE) 500 MG tablet Take 1 tablet (500 mg total) by mouth daily with breakfast. 12/15/17  Yes Delia Chimes A, MD  metoprolol tartrate (LOPRESSOR) 25 MG tablet Take 1 tablet by mouth two  times daily 09/18/16  Yes Crenshaw, Denice Bors, MD  Multiple Vitamin (MULTIVITAMIN) tablet Take 1 tablet by mouth daily. 11/30/12  Yes Rosana Hoes, MD  pantoprazole (PROTONIX) 40 MG tablet TAKE 1 TABLET BY MOUTH ONCE DAILY 10/23/19  Yes Wellington Hampshire, MD  pregabalin (LYRICA)  150 MG capsule Take 1 capsule (150 mg total) by mouth daily. 05/02/18  Yes [place],  Zoe A, MD  sildenafil (VIAGRA) 100 MG tablet Take 0.5-1 tablets (50-100 mg total) by mouth daily as needed for erectile dysfunction. 08/25/18  Yes Horald Pollen, MD  tamsulosin (FLOMAX) 0.4 MG CAPS capsule TAKE 1 CAPSULE BY MOUTH ONCE DAILY 12/14/19  Yes [name], MD     Depression screen [facility] 2/9 01/03/2020 12/21/2019 12/08/2019 08/29/2019 11/07/2018  Decreased Interest 0 0 0 0 0  Down, Depressed, Hopeless 0 0 0 0 0  PHQ - 2 Score 0 0 0 0 0  Some recent data might be hidden     Fall Risk  01/03/2020 12/21/2019 12/08/2019 08/29/2019 11/07/2018  Falls in the past year? 0 0 0 0 0  Number falls in past yr: 0 0 0 - -  Injury with Fall? 0 0 0 - -  Comment - - - - -  Follow up Falls evaluation completed;Education provided Falls evaluation completed Falls evaluation completed Falls evaluation completed -      PHYSICAL EXAM: BP 126/77 Comment: taken from a previous visit  Ht 5\' 9"  (1.753 m)   Wt 224 lb (101.6 kg)   BMI 33.08 kg/m    Wt Readings from Last 3 Encounters:  01/03/20 224 lb (101.6 kg)  12/21/19 224 lb (101.6 kg)  12/08/19 220 lb 9.6 oz (100.1 kg)   Medicare annual wellness visit, subsequent     Education/Counseling provided regarding diet and exercise, prevention of chronic diseases, smoking/tobacco cessation, if applicable, and reviewed "Covered Medicare Preventive Services."

## 2020-01-03 NOTE — Patient Instructions (Addendum)
Thank you for taking time to come for your Medicare Wellness Visit. I appreciate your ongoing commitment to your health goals. Please review the following plan we discussed and let me know if I can assist you in the future.  Kishan Wachsmuth LPN  Preventive Care 40-64 Years Old, Male Preventive care refers to lifestyle choices and visits with your health care provider that can promote health and wellness. This includes:  A yearly physical exam. This is also called an annual well check.  Regular dental and eye exams.  Immunizations.  Screening for certain conditions.  Healthy lifestyle choices, such as eating a healthy diet, getting regular exercise, not using drugs or products that contain nicotine and tobacco, and limiting alcohol use. What can I expect for my preventive care visit? Physical exam Your health care provider will check:  Height and weight. These may be used to calculate body mass index (BMI), which is a measurement that tells if you are at a healthy weight.  Heart rate and blood pressure.  Your skin for abnormal spots. Counseling Your health care provider may ask you questions about:  Alcohol, tobacco, and drug use.  Emotional well-being.  Home and relationship well-being.  Sexual activity.  Eating habits.  Work and work environment. What immunizations do I need?  Influenza (flu) vaccine  This is recommended every year. Tetanus, diphtheria, and pertussis (Tdap) vaccine  You may need a Td booster every 10 years. Varicella (chickenpox) vaccine  You may need this vaccine if you have not already been vaccinated. Zoster (shingles) vaccine  You may need this after age 60. Measles, mumps, and rubella (MMR) vaccine  You may need at least one dose of MMR if you were born in 1957 or later. You may also need a second dose. Pneumococcal conjugate (PCV13) vaccine  You may need this if you have certain conditions and were not previously vaccinated. Pneumococcal  polysaccharide (PPSV23) vaccine  You may need one or two doses if you smoke cigarettes or if you have certain conditions. Meningococcal conjugate (MenACWY) vaccine  You may need this if you have certain conditions. Hepatitis A vaccine  You may need this if you have certain conditions or if you travel or work in places where you may be exposed to hepatitis A. Hepatitis B vaccine  You may need this if you have certain conditions or if you travel or work in places where you may be exposed to hepatitis B. Haemophilus influenzae type b (Hib) vaccine  You may need this if you have certain risk factors. Human papillomavirus (HPV) vaccine  If recommended by your health care provider, you may need three doses over 6 months. You may receive vaccines as individual doses or as more than one vaccine together in one shot (combination vaccines). Talk with your health care provider about the risks and benefits of combination vaccines. What tests do I need? Blood tests  Lipid and cholesterol levels. These may be checked every 5 years, or more frequently if you are over 50 years old.  Hepatitis C test.  Hepatitis B test. Screening  Lung cancer screening. You may have this screening every year starting at age 55 if you have a 30-pack-year history of smoking and currently smoke or have quit within the past 15 years.  Prostate cancer screening. Recommendations will vary depending on your family history and other risks.  Colorectal cancer screening. All adults should have this screening starting at age 50 and continuing until age 75. Your health care provider may   recommend screening at age 78 if you are at increased risk. You will have tests every 1-10 years, depending on your results and the type of screening test.  Diabetes screening. This is done by checking your blood sugar (glucose) after you have not eaten for a while (fasting). You may have this done every 1-3 years.  Sexually transmitted  disease (STD) testing. Follow these instructions at home: Eating and drinking  Eat a diet that includes fresh fruits and vegetables, whole grains, lean protein, and low-fat dairy products.  Take vitamin and mineral supplements as recommended by your health care provider.  Do not drink alcohol if your health care provider tells you not to drink.  If you drink alcohol: ? Limit how much you have to 0-2 drinks a day. ? Be aware of how much alcohol is in your drink. In the [place] one drink equals one 12 oz bottle of beer (355 mL), one 5 oz glass of wine (148 mL), or one 1 oz glass of hard liquor (44 mL). Lifestyle  Take daily care of your teeth and gums.  Stay active. Exercise for at least 30 minutes on 5 or more days each week.  Do not use any products that contain nicotine or tobacco, such as cigarettes, e-cigarettes, and chewing tobacco. If you need help quitting, ask your health care provider.  If you are sexually active, practice safe sex. Use a condom or other form of protection to prevent STIs (sexually transmitted infections).  Talk with your health care provider about taking a low-dose aspirin every day starting at age 102. What's next?  Go to your health care provider once a year for a well check visit.  Ask your health care provider how often you should have your eyes and teeth checked.  Stay up to date on all vaccines. This information is not intended to replace advice given to you by your health care provider. Make sure you discuss any questions you have with your health care provider. Document Revised: 10/27/2018 Document Reviewed: 10/27/2018 Elsevier Patient Education  2020 [employer].

## 2020-01-08 ENCOUNTER — Other Ambulatory Visit: Payer: Self-pay

## 2020-01-08 ENCOUNTER — Encounter: Payer: Self-pay | Admitting: Family Medicine

## 2020-01-08 ENCOUNTER — Ambulatory Visit (INDEPENDENT_AMBULATORY_CARE_PROVIDER_SITE_OTHER): Payer: Medicare Other | Admitting: Family Medicine

## 2020-01-08 DIAGNOSIS — E875 Hyperkalemia: Secondary | ICD-10-CM

## 2020-01-08 NOTE — Progress Notes (Signed)
Patient here for labs only. 

## 2020-01-08 NOTE — Patient Instructions (Signed)
° ° ° °  If you have lab work done today you will be contacted with your lab results within the next 2 weeks.  If you have not heard from us then please contact us. The fastest way to get your results is to register for My Chart. ° ° °IF you received an x-ray today, you will receive an invoice from [facility]. Please contact [facility] at [phone] with questions or concerns regarding your invoice.  ° °IF you received labwork today, you will receive an invoice from [facility]. Please contact [facility] at [phone] with questions or concerns regarding your invoice.  ° °Our billing staff will not be able to assist you with questions regarding bills from these companies. ° °You will be contacted with the lab results as soon as they are available. The fastest way to get your results is to activate your My Chart account. Instructions are located on the last page of this paperwork. If you have not heard from us regarding the results in 2 weeks, please contact this office. °  ° ° ° °

## 2020-01-09 ENCOUNTER — Ambulatory Visit: Payer: Medicare Other | Admitting: Cardiovascular Disease

## 2020-01-09 LAB — CMP14+EGFR
ALT: 32 IU/L (ref 0–44)
AST: 30 IU/L (ref 0–40)
Albumin/Globulin Ratio: 1.7 (ref 1.2–2.2)
Albumin: 4.4 g/dL (ref 3.8–4.8)
Alkaline Phosphatase: 81 IU/L (ref 39–117)
BUN/Creatinine Ratio: 13 (ref 10–24)
BUN: 13 mg/dL (ref 8–27)
Bilirubin Total: 0.3 mg/dL (ref 0.0–1.2)
CO2: 25 mmol/L (ref 20–29)
Calcium: 9.8 mg/dL (ref 8.6–10.2)
Chloride: 105 mmol/L (ref 96–106)
Creatinine, Ser: 0.97 mg/dL (ref 0.76–1.27)
GFR calc Af Amer: 96 mL/min/{1.73_m2} (ref >59)
GFR calc non Af Amer: 83 mL/min/{1.73_m2} (ref >59)
Globulin, Total: 2.6 g/dL (ref 1.5–4.5)
Glucose: 120 mg/dL — ABNORMAL HIGH (ref 65–99)
Potassium: 5 mmol/L (ref 3.5–5.2)
Sodium: 143 mmol/L (ref 134–144)
Total Protein: 7 g/dL (ref 6.0–8.5)

## 2020-01-09 LAB — PTH, INTACT AND CALCIUM: PTH: 28 pg/mL (ref 15–65)

## 2020-01-17 ENCOUNTER — Other Ambulatory Visit: Payer: Self-pay | Admitting: Family Medicine

## 2020-01-17 NOTE — Telephone Encounter (Signed)
Patient is requesting a refill of the following medications: Requested Prescriptions   Pending Prescriptions Disp Refills  . chlorhexidine (PERIDEX) 0.12 % solution 120 mL 0    Sig: Use as directed 15 mLs in the mouth or throat 2 (two) times daily.    Date of patient request: 01/17/2020 Last office visit: 01/08/2020 Date of last refill: 12/21/2019 Last refill amount: 120 ml Follow up time period per chart: Follow up scheduled

## 2020-01-17 NOTE — Telephone Encounter (Signed)
What is the name of the medication? chlorhexidine (PERIDEX) 0.12 % solution [[id number]]   Have you contacted your pharmacy to request a refill? Yes   Which pharmacy would you like this sent to?  [facility], [address]  [place], [place] [address]   Patient notified that their request is being sent to the clinical staff for review and that they should receive a call once it is complete. If they do not receive a call within 72 hours they can check with their pharmacy or our office.

## 2020-01-19 MED ORDER — CHLORHEXIDINE GLUCONATE 0.12 % MT SOLN: 15.0000 mL | Freq: Two times a day (BID) | OROMUCOSAL | 0 refills | Status: DC

## 2020-01-19 MED FILL — CHLORHEXIDINE 0.12% RINSE: 0.12 | 15 days supply | Qty: 473 | Fill #0

## 2020-01-23 ENCOUNTER — Other Ambulatory Visit: Payer: Self-pay | Admitting: Cardiovascular Disease

## 2020-01-23 NOTE — Telephone Encounter (Signed)
Please review for refill, Thanks !  

## 2020-01-24 ENCOUNTER — Other Ambulatory Visit: Payer: Self-pay

## 2020-01-24 MED ORDER — PANTOPRAZOLE SODIUM 40 MG PO TBEC: 40.0000 mg | DELAYED_RELEASE_TABLET | Freq: Every day | ORAL | 2 refills | Status: DC

## 2020-01-24 MED FILL — PANTOPRAZOLE SOD DR 40 MG T: 40 | 30 days supply | Qty: 30 | Fill #0

## 2020-02-12 ENCOUNTER — Other Ambulatory Visit: Payer: Self-pay | Admitting: Family Medicine

## 2020-02-12 MED FILL — TAMSULOSIN HCL 0.4 MG CAP: 0.4 | 60 days supply | Qty: 60 | Fill #0

## 2020-02-12 NOTE — Telephone Encounter (Signed)
Requested Prescriptions  Pending Prescriptions Disp Refills  . tamsulosin (FLOMAX) 0.4 MG CAPS capsule [Pharmacy Med Name: TAMSULOSIN HCL 0.4 MG CAP 0.4 Capsule] 60 capsule 0    Sig: TAKE 1 CAPSULE BY MOUTH ONCE DAILY     Urology: Alpha-Adrenergic Blocker Passed - 02/12/2020  7:55 AM      Passed - Last BP in normal range    BP Readings from Last 1 Encounters:  01/08/20 117/75         Passed - Valid encounter within last 12 months    Recent Outpatient Visits          1 month ago Hypercalcemia   Primary Care at [facility], Arlie Solomons, MD   1 month ago Medicare annual wellness visit, subsequent   Primary Care at [facility], Arlie Solomons, MD   1 month ago Injury of tongue, initial encounter   Primary Care at [facility], Lorelee Market, NP   2 months ago Type 2 diabetes mellitus with peripheral neuropathy [facility])   Primary Care at [place], MD   5 months ago Type 2 diabetes mellitus with peripheral neuropathy [facility])   Primary Care at [facility], Ines Bloomer, MD      Future Appointments            In 3 months [name], MD Primary Care at [place], [facility]

## 2020-02-23 MED FILL — PANTOPRAZOLE SOD DR 40 MG T: 40 | 30 days supply | Qty: 30 | Fill #1

## 2020-03-26 ENCOUNTER — Ambulatory Visit: Payer: Medicare Other | Admitting: Cardiovascular Disease

## 2020-04-02 ENCOUNTER — Encounter: Payer: Self-pay | Admitting: General Practice

## 2020-04-17 ENCOUNTER — Other Ambulatory Visit: Payer: Self-pay

## 2020-04-17 DIAGNOSIS — I2510 Atherosclerotic heart disease of native coronary artery without angina pectoris: Secondary | ICD-10-CM | POA: Diagnosis not present

## 2020-04-17 DIAGNOSIS — R1032 Left lower quadrant pain: Secondary | ICD-10-CM | POA: Diagnosis not present

## 2020-04-17 DIAGNOSIS — E1140 Type 2 diabetes mellitus with diabetic neuropathy, unspecified: Secondary | ICD-10-CM | POA: Diagnosis not present

## 2020-04-17 DIAGNOSIS — I10 Essential (primary) hypertension: Secondary | ICD-10-CM | POA: Diagnosis not present

## 2020-04-17 DIAGNOSIS — Z85038 Personal history of other malignant neoplasm of large intestine: Secondary | ICD-10-CM | POA: Diagnosis not present

## 2020-04-17 MED ORDER — TAMSULOSIN HCL 0.4 MG PO CAPS: 0.4000 mg | ORAL_CAPSULE | Freq: Every day | ORAL | 2 refills | Status: DC

## 2020-04-17 NOTE — Telephone Encounter (Signed)
Prescription fax received from [facility] for tamsulosin 0.4 mg , patient reports taking one a day and reports has one pill left .

## 2020-04-17 NOTE — Telephone Encounter (Signed)
Call Pt let his know his med is on Pharmacy

## 2020-04-18 MED FILL — TAMSULOSIN HCL 0.4 MG CAP: 0.4 | 30 days supply | Qty: 30 | Fill #0

## 2020-04-19 ENCOUNTER — Ambulatory Visit
Admission: RE | Admit: 2020-04-19 | Discharge: 2020-04-19 | Disposition: A | Discharge status: Home / Self Care | Payer: Medicare Other | Source: Ambulatory Visit | Attending: Cardiovascular Disease | Admitting: Cardiovascular Disease

## 2020-04-19 ENCOUNTER — Other Ambulatory Visit: Payer: Self-pay | Admitting: Family Medicine

## 2020-04-19 DIAGNOSIS — R109 Unspecified abdominal pain: Secondary | ICD-10-CM | POA: Diagnosis not present

## 2020-04-19 DIAGNOSIS — I2510 Atherosclerotic heart disease of native coronary artery without angina pectoris: Secondary | ICD-10-CM | POA: Diagnosis not present

## 2020-04-19 DIAGNOSIS — Z85038 Personal history of other malignant neoplasm of large intestine: Secondary | ICD-10-CM

## 2020-04-19 DIAGNOSIS — E1140 Type 2 diabetes mellitus with diabetic neuropathy, unspecified: Secondary | ICD-10-CM | POA: Diagnosis not present

## 2020-04-19 DIAGNOSIS — R1032 Left lower quadrant pain: Secondary | ICD-10-CM | POA: Diagnosis not present

## 2020-04-19 MED ORDER — IOPAMIDOL (ISOVUE-300) INJECTION 61%
100.0000 mL | Freq: Once | INTRAVENOUS | Status: AC | PRN
  Administered 2020-04-19: 100 mL via INTRAVENOUS

## 2020-04-26 ENCOUNTER — Other Ambulatory Visit: Payer: Self-pay | Admitting: Cardiovascular Disease

## 2020-04-26 NOTE — Telephone Encounter (Signed)
Refill request

## 2020-04-29 ENCOUNTER — Other Ambulatory Visit: Payer: Self-pay | Admitting: Family Medicine

## 2020-04-29 DIAGNOSIS — R911 Solitary pulmonary nodule: Secondary | ICD-10-CM

## 2020-05-01 ENCOUNTER — Other Ambulatory Visit: Payer: Self-pay | Admitting: Family Medicine

## 2020-05-01 DIAGNOSIS — R9389 Abnormal findings on diagnostic imaging of other specified body structures: Secondary | ICD-10-CM

## 2020-05-01 DIAGNOSIS — R918 Other nonspecific abnormal finding of lung field: Secondary | ICD-10-CM

## 2020-05-01 DIAGNOSIS — R911 Solitary pulmonary nodule: Secondary | ICD-10-CM

## 2020-05-02 ENCOUNTER — Other Ambulatory Visit: Payer: Self-pay | Admitting: Family Medicine

## 2020-05-02 DIAGNOSIS — R9389 Abnormal findings on diagnostic imaging of other specified body structures: Secondary | ICD-10-CM

## 2020-05-03 ENCOUNTER — Ambulatory Visit
Admission: RE | Admit: 2020-05-03 | Discharge: 2020-05-03 | Disposition: A | Discharge status: Home / Self Care | Payer: Medicare Other | Source: Ambulatory Visit | Attending: *Deleted | Admitting: *Deleted

## 2020-05-03 DIAGNOSIS — R9389 Abnormal findings on diagnostic imaging of other specified body structures: Secondary | ICD-10-CM

## 2020-05-03 DIAGNOSIS — R911 Solitary pulmonary nodule: Secondary | ICD-10-CM | POA: Diagnosis not present

## 2020-05-03 MED ORDER — IOPAMIDOL (ISOVUE-300) INJECTION 61%
75.0000 mL | Freq: Once | INTRAVENOUS | Status: AC | PRN
  Administered 2020-05-03: 75 mL via INTRAVENOUS

## 2020-05-15 ENCOUNTER — Telehealth: Payer: Self-pay | Admitting: Internal Medicine

## 2020-05-15 NOTE — Telephone Encounter (Signed)
Spoke with [place] at Dr. Nolon Rod office and relayed message from Dr. Melvyn Novas. She will have Dr. Nolon Rod call back to let us know if she wants Dr. Melvyn Novas to order the PET. Will await return call.

## 2020-05-15 NOTE — Telephone Encounter (Signed)
Dr. Bridget Hartshorn calling regarding a referral sent to our office. Patient has history of colon cancer, bowel resection 2011, found new pulmonary nodule during CT abdomen on 04/19/2020, see also CT chest 05/03/2020. She is concerned over metastasis or primary mass. She is requesting he be seen as soon as possible. First available slot is 05/27/2020. Is this soon enough? Please advise.

## 2020-05-15 NOTE — Telephone Encounter (Signed)
Let her know can't move foreward s a PET scan anyway and we can order this in the meantime if she desires  Dx is solitary pulmonary nodule >  1 cm

## 2020-05-17 ENCOUNTER — Telehealth: Payer: Self-pay | Admitting: Internal Medicine

## 2020-05-17 DIAGNOSIS — R911 Solitary pulmonary nodule: Secondary | ICD-10-CM

## 2020-05-17 NOTE — Telephone Encounter (Signed)
Dr. Melvyn Novas, Please see message from patient and advise. Thank you.

## 2020-05-17 NOTE — Telephone Encounter (Signed)
Order for pet scan has been placed.  Nothing further needed.

## 2020-05-17 NOTE — Telephone Encounter (Signed)
Yes order it as per previous entry  Dx is solitary pulmonary nodule >  1 cm

## 2020-05-19 MED FILL — TAMSULOSIN HCL 0.4 MG CAP: 0.4 | 30 days supply | Qty: 30 | Fill #1

## 2020-05-21 ENCOUNTER — Telehealth: Payer: Self-pay | Admitting: *Deleted

## 2020-05-21 NOTE — Telephone Encounter (Signed)
See phone note 05/17/20- PET ordered.

## 2020-05-21 NOTE — Telephone Encounter (Signed)
Left message in mobile voice mail to call back if he still continues to use Chlorhexidine rinse. [place] is requesting a refill on this medication.

## 2020-05-21 NOTE — Telephone Encounter (Signed)
Pt called stating that he does still use this medication and is requesting to refill. Please advise.

## 2020-05-22 ENCOUNTER — Telehealth: Payer: Self-pay | Admitting: *Deleted

## 2020-05-22 MED FILL — CHLORHEXIDINE 0.12% RINSE: 0.12 | 15 days supply | Qty: 473 | Fill #0

## 2020-05-22 NOTE — Telephone Encounter (Signed)
Spoke to patient the medication Chlorhexidine rinse has been faxed to [facility]. Also, advised to make an appointment for transfer of care to Dr Mitchel Honour because Dr Nolon Rod is no longer at this office. Patient stated he will schedule the appointment.

## 2020-05-27 ENCOUNTER — Encounter: Payer: Self-pay | Admitting: Internal Medicine

## 2020-05-27 ENCOUNTER — Ambulatory Visit (INDEPENDENT_AMBULATORY_CARE_PROVIDER_SITE_OTHER): Payer: Medicare Other | Admitting: Internal Medicine

## 2020-05-27 ENCOUNTER — Other Ambulatory Visit: Payer: Self-pay

## 2020-05-27 DIAGNOSIS — R911 Solitary pulmonary nodule: Secondary | ICD-10-CM

## 2020-05-27 DIAGNOSIS — G8929 Other chronic pain: Secondary | ICD-10-CM

## 2020-05-27 DIAGNOSIS — R109 Unspecified abdominal pain: Secondary | ICD-10-CM | POA: Diagnosis not present

## 2020-05-27 MED FILL — PANTOPRAZOLE SOD DR 40 MG T: 40 | 30 days supply | Qty: 30 | Fill #1

## 2020-05-27 NOTE — Progress Notes (Addendum)
Steven Hale., male    DOB: [birth year], 64 y.o.   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 Consult  Dyspnea:  Limited by back more than breathing since 2012  Cough: none  Sleep: bed is flat various positions/ one pillow - no resp cc/s  SABA use: nothing  Original symptom = chronic abd swelling/constipation improves some p using miralax/ f/u by Steven Hale planned  but says problem not resolved as not having regular soft bm's and not sure how to take / titrate miralax but clearly better p BM's  No obvious day to day or daytime variability or assoc excess/ purulent sputum or mucus plugs or hemoptysis or cp or chest tightness, subjective wheeze or overt sinus or hb symptoms.   Sleeping  without nocturnal  or early am exacerbation  of respiratory  c/o's or need for noct saba. Also denies any obvious fluctuation of symptoms with weather or environmental changes or other aggravating or alleviating factors except as outlined above   No unusual exposure hx or h/o childhood pna/ asthma or knowledge of premature birth.  Current Allergies, Complete Past Medical History, Past Surgical History, Family History, and Social History were reviewed in [employer] record.  ROS  The following are not active complaints unless bolded Hoarseness, sore throat, dysphagia, dental problems, itching, sneezing,  nasal congestion or discharge of excess mucus or purulent secretions, ear ache,   fever, chills, sweats, unintended wt loss or wt gain, classically pleuritic or  exertional cp,  orthopnea pnd or arm/hand swelling  or leg swelling, presyncope, palpitations, abdominal pain, anorexia, nausea, vomiting, diarrhea  or change in bowel habits or change in bladder habits, change in stools or change in urine, dysuria, hematuria,  rash, arthralgias, visual complaints, headache, numbness, weakness or ataxia or problems with walking or coordination,  change in mood or  memory.             Past Medical History:  Diagnosis Date  . Anemia, unspecified   . Bell's palsy   . Blood transfusion    during treatment for Ca  . Blood transfusion without reported diagnosis   . CAD (coronary artery disease)    a. s/p multiple PCIs;  b. s/p CABG in 09/2010 (LIMA-LAD, SVG-OM1, SVG-distal RCA/OM2);  Cardiac cath in 12/2015: Mild LAD disease, occluded mid LCX and proximal RCA (left to right collaterals), Occluded SVG to RCA and atretic LIMA. Patent SVG to OM  . Chronic back pain   . Diabetes mellitus without complication ([place])   . DJD (degenerative joint disease)    low back & all over   . GERD (gastroesophageal reflux disease)   . Helicobacter pylori (H. pylori) infection   . Hemorrhoids   . Hyperlipidemia   . Hypertension   . Impotence of organic origin   . Myocardial infarction ([place])    2005 and 2012   . PAD (peripheral artery disease) ([place])    a. s/p bilat SFA stents;  b. ABIs 11/2012: R 0.99, L  0.86.  . Pancreatic cancer [facility])    surgery 2009  . Pituitary adenoma [facility])    surgery at [facility] Feb [facility]  . Pre-diabetes 08/16/2015  . Sleep apnea 06/05/2019    Outpatient Medications Prior to Visit  Medication Sig Dispense Refill  . aspirin EC 81 MG tablet Take 1 tablet (81 mg total) by mouth daily.    [name] atorvastatin (LIPITOR) 40 MG tablet Take 1 tablet (40 mg total) by mouth daily. (Patient taking differently: Take 40 mg by mouth at bedtime. ) 90 tablet 3  . DULoxetine HCl 60 MG CSDR Take 60 mg by mouth.    . fish oil-omega-3 fatty acids 1000 MG capsule Take 1 capsule (1  g total) by mouth daily. 60 capsule 1  . metFORMIN (GLUCOPHAGE) 500 MG tablet Take 1 tablet (500 mg total) by mouth daily with breakfast. 90 tablet 3  . metoprolol tartrate (LOPRESSOR) 25 MG tablet Take 1 tablet by mouth two  times daily 60 tablet 0  . Multiple Vitamin (MULTIVITAMIN) tablet Take 1 tablet by mouth daily. 30 tablet 10  . pantoprazole (PROTONIX) 40 MG tablet  pt says not taken for months     . pregabalin (LYRICA) 150 MG capsule Take 1 capsule (150 mg total) by mouth daily. 30 capsule 0  . sildenafil (VIAGRA) 100 MG tablet Take 0.5-1 tablets (50-100 mg total) by mouth daily as needed for erectile dysfunction. 5 tablet 11  . tamsulosin (FLOMAX) 0.4 MG CAPS capsule Take 1 capsule (0.4 mg total) by mouth daily. 30 capsule 2  . chlorhexidine (PERIDEX) 0.12 % solution Use as directed 15 mLs in the mouth or throat 2 (two) times daily. 120 mL 0   No facility-administered medications prior to visit.     Objective:     BP 124/74 (BP Location: Left Arm, Cuff Size: Normal)   Pulse 74   Temp 98.3 F (36.8 C) (Oral)   Ht '5\' 9"'$  (1.753 m)   Wt 227 lb 9.6 oz (103.2 kg)   SpO2 97% Comment: RA  BMI 33.61 kg/m   SpO2: 97 % (RA)   Wt Readings from Last 3 Encounters:  05/27/20 227 lb 9.6 oz (103.2 kg)  01/08/20 224 lb 14.4 oz (102 kg)  01/03/20 224 lb (101.6 kg)      amb bm nad rambling historian/ seeing many doctors with poor grasp of details of care or names of docs  "I'm being seen at [facility], it's part of the [place]"    HEENT : pt wearing mask not removed for exam due to covid -19 concerns.    NECK :  without JVD/Nodes/TM/ nl carotid upstrokes bilaterally   LUNGS: no acc muscle use,  Nl contour chest which is clear to A and P bilaterally without cough on insp or exp maneuvers   CV:  RRR  no s3 or murmur or increase in P2, and no edema   ABD:  soft and nontender with nl inspiratory excursion in the supine position. No bruits or organomegaly appreciated, bowel sounds nl  MS:  Nl  gait/ ext warm without deformities, calf tenderness, cyanosis or clubbing No obvious joint restrictions   SKIN: warm and dry without lesions    NEURO:  alert, approp, nl sensorium with  no motor or cerebellar deficits apparent.     I personally reviewed images and agree with radiology impression as follows:  Chest CT   05/03/20  1. As seen on recent abdominal CT, there is a 2.0 cm right middle  lobe pulmonary nodule which is new from older prior studies. This could reflect a solitary metastasis or primary bronchogenic carcinoma. No other suspicious pulmonary nodules. 2. Left adrenal nodule, enlarged from older prior studies (2011). This too could reflect metastatic disease or local recurrence. Regenerated splenic tissue is a consideration given previous splenectomy.      Assessment   Solitary pulmonary nodule present on computed tomography of lung Quit smoking 2010 - h/o pancreatic ca s/p surgery 2009 with splenectomy  - CT chest 05/03/20  2.0 RML nodule with ? Adrenal Met  - PET 05/29/20  >>>  This is most likely a primary bronchogenic ca with incidental benign adrenal adenoma or he has met dz  - the fact that he's had so much abdominal pain would normally concern me for recurrent pancreatic ca but he's 11 years out from surgery, the pain pattern is chronic s wt loss/  more typical of IBS  and his CT abd does not suggest recurrence.   Therefore if PET neg x for the RML nodule would proceed directly to excisional bx / RMLobectomy rather than attermpt a less invasive bx given that this pt has well preserved lung function clinically (would need preop pfts of course to prove it).  Discussed in detail all the  indications, usual  risks and alternatives  relative to the benefits with patient who agrees to proceed with w/u as outlined.       Chronic abdominal pain F/u by Steven Hale on miralax   Consistent improvement in abd pain/ bloating p miralax/ bm's suggests a benign etiology esp given  his ct abd findings > advised to titrate miralax to where having regular soft bm's to treat the problem "pre-emptively" and f/u with Dr Steven Hale as planned.      Medical decision making was a moderate level of complexity in this case because of  two chronic conditions /diagnoses requiring extra time for  H and P, chart review, counseling,   and generating customized AVS unique to this office visit and charting.   Each maintenance medication was reviewed in detail including emphasizing most importantly the difference between maintenance and prns and under what circumstances the prns are to be triggered using an action plan format where appropriate. Please see avs for details which were reviewed in writing by both me and my nurse and patient given a written copy highlighted where appropriate with yellow highlighter for the patient's continued care at home along with an updated version of their medications.  Patient was asked to maintain medication reconciliation by comparing this list to the actual medications being used at home and to contact this office right away if there is a conflict or discrepancy.      Christinia Gully, MD 05/27/2020

## 2020-05-27 NOTE — Patient Instructions (Addendum)
Keep your appt for your PET scan on 05/29/20 and I will call with recommendations for either a biopsy or go see Merilynn Finland for Right Middle Removal.  Take enough miralax to where you are having regular soft bowel movements

## 2020-05-28 ENCOUNTER — Encounter: Payer: Self-pay | Admitting: Internal Medicine

## 2020-05-28 DIAGNOSIS — G8929 Other chronic pain: Secondary | ICD-10-CM | POA: Insufficient documentation

## 2020-05-28 NOTE — Assessment & Plan Note (Signed)
Quit smoking 2010 - h/o pancreatic ca s/p surgery 2009 with splenectomy  - CT chest 05/03/20  2.0 RML nodule with ? Adrenal Met  - PET 05/29/20  >>>  This is most likely a primary bronchogenic ca with incidental benign adrenal adenoma or he has met dz  - the fact that he's had so much abdominal pain would normally concern me for recurrent pancreatic ca but he's 11 years out from surgery, the pain pattern is chronic s wt loss/  more typical of IBS  and his CT abd does not suggest recurrence.   Therefore if PET neg x for the RML nodule would proceed directly to excisional bx / RMLobectomy rather than attermpt a less invasive bx given that this pt has well preserved lung function clinically (would need preop pfts of course to prove it).  Discussed in detail all the  indications, usual  risks and alternatives  relative to the benefits with patient who agrees to proceed with w/u as outlined.

## 2020-05-28 NOTE — Assessment & Plan Note (Signed)
F/u by Benson Norway on miralax   Consistent improvement in abd pain/ bloating p miralax/ bm's suggests a benign etiology esp given his ct abd findings > advised to titrate miralax to where having regular soft bm's to treat the problem "pre-emptively" and f/u with Dr Benson Norway as planned.   Medical decision making was a moderate level of complexity in this case because of  two chronic conditions /diagnoses requiring extra time for  H and P, chart review, counseling,   and generating customized AVS unique to this office visit and charting.   Each maintenance medication was reviewed in detail including emphasizing most importantly the difference between maintenance and prns and under what circumstances the prns are to be triggered using an action plan format where appropriate. Please see avs for details which were reviewed in writing by both me and my nurse and patient given a written copy highlighted where appropriate with yellow highlighter for the patient's continued care at home along with an updated version of their medications.  Patient was asked to maintain medication reconciliation by comparing this list to the actual medications being used at home and to contact this office right away if there is a conflict or discrepancy.

## 2020-05-29 ENCOUNTER — Ambulatory Visit (HOSPITAL_COMMUNITY)
Admission: RE | Admit: 2020-05-29 | Discharge: 2020-05-29 | Disposition: A | Discharge status: Home / Self Care | Payer: Medicare Other | Source: Ambulatory Visit | Attending: Internal Medicine | Admitting: Internal Medicine

## 2020-05-29 ENCOUNTER — Other Ambulatory Visit: Payer: Self-pay

## 2020-05-29 DIAGNOSIS — R911 Solitary pulmonary nodule: Secondary | ICD-10-CM

## 2020-05-29 DIAGNOSIS — Z8507 Personal history of malignant neoplasm of pancreas: Secondary | ICD-10-CM | POA: Diagnosis not present

## 2020-05-29 DIAGNOSIS — Z85038 Personal history of other malignant neoplasm of large intestine: Secondary | ICD-10-CM | POA: Insufficient documentation

## 2020-05-29 LAB — GLUCOSE, CAPILLARY: Glucose-Capillary: 138 mg/dL — ABNORMAL HIGH (ref 70–99)

## 2020-05-29 MED ORDER — FLUDEOXYGLUCOSE F - 18 (FDG) INJECTION: 11.2000 | Freq: Once | INTRAVENOUS | Status: DC | PRN

## 2020-05-30 DIAGNOSIS — M5136 Other intervertebral disc degeneration, lumbar region: Secondary | ICD-10-CM | POA: Insufficient documentation

## 2020-06-07 ENCOUNTER — Ambulatory Visit: Payer: Medicare Other | Admitting: Family Medicine

## 2020-06-18 MED FILL — TAMSULOSIN HCL 0.4 MG CAP: 0.4 | 30 days supply | Qty: 30 | Fill #2

## 2020-06-19 MED FILL — PANTOPRAZOLE SOD DR 40 MG T: 40 | 30 days supply | Qty: 30 | Fill #2

## 2020-07-23 ENCOUNTER — Other Ambulatory Visit: Payer: Self-pay | Admitting: Family Medicine

## 2020-07-23 MED FILL — TAMSULOSIN HCL 0.4 MG CAP: 0.4 | 30 days supply | Qty: 30 | Fill #0

## 2020-07-30 ENCOUNTER — Ambulatory Visit (INDEPENDENT_AMBULATORY_CARE_PROVIDER_SITE_OTHER): Payer: Medicare Other | Admitting: Cardiovascular Disease

## 2020-07-30 ENCOUNTER — Other Ambulatory Visit: Payer: Self-pay

## 2020-07-30 ENCOUNTER — Encounter: Payer: Self-pay | Admitting: Cardiovascular Disease

## 2020-07-30 ENCOUNTER — Other Ambulatory Visit: Payer: Self-pay | Admitting: Cardiovascular Disease

## 2020-07-30 VITALS — BP 104/72 | HR 62 | Ht 69.0 in | Wt 234.0 lb

## 2020-07-30 DIAGNOSIS — I739 Peripheral vascular disease, unspecified: Secondary | ICD-10-CM

## 2020-07-30 DIAGNOSIS — E785 Hyperlipidemia, unspecified: Secondary | ICD-10-CM

## 2020-07-30 DIAGNOSIS — I2510 Atherosclerotic heart disease of native coronary artery without angina pectoris: Secondary | ICD-10-CM | POA: Diagnosis not present

## 2020-07-30 DIAGNOSIS — I10 Essential (primary) hypertension: Secondary | ICD-10-CM | POA: Diagnosis not present

## 2020-07-30 MED ORDER — EZETIMIBE 10 MG PO TABS: 10.0000 mg | ORAL_TABLET | Freq: Every day | ORAL | 3 refills | Status: DC

## 2020-07-30 MED FILL — PANTOPRAZOLE SOD DR 40 MG T: 40 | 30 days supply | Qty: 30 | Fill #3

## 2020-07-30 MED FILL — EZETIMIBE 10 MG TABS: 10 | 90 days supply | Qty: 90 | Fill #0

## 2020-07-30 NOTE — Patient Instructions (Addendum)
Medication Instructions:  START Ezetimibe (Zetia) 10 mg once daily   *If you need a refill on your cardiac medications before your next appointment, please call your pharmacy*   Lab Work: Your provider would like for you to return in 2 months to have the following labs drawn: Fasting Lipid and Liver. You do not need an appointment for the lab. Once in our office lobby there is a podium where you can sign in and ring the doorbell to alert [place] that you are here. The lab is open from 8:00 am to 4:30 pm; closed for lunch from 12:45pm-1:45pm.  If you have labs (blood work) drawn today and your tests are completely normal, you will receive your results only by: [name] MyChart Message (if you have MyChart) OR . A paper copy in the mail If you have any lab test that is abnormal or we need to change your treatment, we will call you to review the results.   Testing/Procedures: None ordered   Follow-Up: At [facility], you and your health needs are our priority.  As part of our continuing mission to provide you with exceptional heart care, we have created designated Provider Care Teams.  These Care Teams include your primary Cardiologist (physician) and Advanced Practice Providers (APPs -  Physician Assistants and Nurse Practitioners) who all work together to provide you with the care you need, when you need it.  We recommend signing up for the patient portal called "MyChart".  Sign up information is provided on this After Visit Summary.  MyChart is used to connect with patients for Virtual Visits (Telemedicine).  Patients are able to view lab/test results, encounter notes, upcoming appointments, etc.  Non-urgent messages can be sent to your provider as well.   To learn more about what you can do with MyChart, go to [email or web address].    Your next appointment:   6 month(s)  The format for your next appointment:   In Person  Provider:   Kathlyn Sacramento, MD

## 2020-07-30 NOTE — Progress Notes (Signed)
Cardiology Office Note   Date:  07/30/2020   ID:  Steven Kindred., DOB Aug 24, 1956, MRN [id number]  PCP:  [name], MD  Cardiologist:  Dr. Lionel December chief complaint on file.     History of Present Illness: Steven Settlemire. is a 64 y.o. male who presents for a followup visit regarding peripheral arterial disease and coronary artery disease. He has known history of coronary artery disease with multiple interventions in the past. He had CABG in 2011.  He quit smoking in 2009.  He had pituitary adenoma resection in February of this year. He has known history of peripheral arterial disease with intervention on both SFAs. Cardiac catheterization in January, 2017 showed mild nonobstructive LAD disease, occluded mid left circumflex and occluded proximal right coronary artery with left-to-right collaterals. SVG to RCA was occluded and LIMA to LAD was atretic. SVG to OM was normal. Abnormal stress test was due to chronically occluded right coronary artery and graft with left-to-right collaterals. His native RCA was not favorable for CTO PCI. I recommended medical therapy. Imdur was added.   Most recent noninvasive vascular studies in 2020 showed an ABI of 0.82 on the right and 1.01 on the left.  Duplex showed significant right mid SFA stenosis with patent stent without restenosis.Left SFA stent was patent without significant restenosis.  He had no significant claudication and this was treated medically.  His symptoms included more of chronic low back pain with radiation.  This is being addressed by neurosurgery and physical therapy.  He reports occasional episodes of sharp chest discomfort at rest lasting few seconds and gets worse with certain movements.  He has no exertional chest tightness or shortness of breath.  He denies leg claudication.  He continues to have generalized itching and some abnormal skin feelings.  He is known to have peripheral neuropathy.  Past Medical  History:  Diagnosis Date   Anemia, unspecified    Bell's palsy    Blood transfusion    during treatment for Ca   Blood transfusion without reported diagnosis    CAD (coronary artery disease)    a. s/p multiple PCIs;  b. s/p CABG in 09/2010 (LIMA-LAD, SVG-OM1, SVG-distal RCA/OM2);  Cardiac cath in 12/2015: Mild LAD disease, occluded mid LCX and proximal RCA (left to right collaterals), Occluded SVG to RCA and atretic LIMA. Patent SVG to OM   Chronic back pain    Diabetes mellitus without complication (H[facility])    DJD (degenerative joint disease)    low back & all over    GERD (gastroesophageal reflux disease)    Helicobacter pylori (H. pylori) infection    Hemorrhoids    Hyperlipidemia    Hypertension    Impotence of organic origin    Myocardial infarction ([place])    2005 and 2012    PAD (peripheral artery disease) ([place])    a. s/p bilat SFA stents;  b. ABIs 11/2012: R 0.99, L 0.86.   Pancreatic cancer [facility])    surgery 2009   Pituitary adenoma [facility])    surgery at [facility] Feb 2020   Pre-diabetes 08/16/2015   Sleep apnea 06/05/2019    Past Surgical History:  Procedure Laterality Date   ABDOMINAL AORTAGRAM N/A 09/13/2013   Procedure: ABDOMINAL Maxcine Ham;  Surgeon: Wellington Hampshire, MD;  Location[facility] CATH LAB;  Service: Cardiovascular;  Laterality: N/A;   ABDOMINAL AORTAGRAM N/A 08/29/2014   Procedure: ABDOMINAL Maxcine Ham;  Surgeon: Wellington Hampshire, MD;  Location: [facility] CATH LAB;  Service: Cardiovascular;  Laterality: N/A;   BACK SURGERY     1992   CARDIAC CATHETERIZATION N/A 12/25/2015   Procedure: Left Heart Cath and Cors/Grafts Angiography;  Surgeon: Wellington Hampshire, MD;  Location: [place] CV LAB;  Service: Cardiovascular;  Laterality: N/A;   CORONARY ARTERY BYPASS GRAFT  nov 2011   x 4   ESOPHAGOGASTRODUODENOSCOPY (EGD) WITH PROPOFOL N/A 04/02/2017   Procedure: ESOPHAGOGASTRODUODENOSCOPY (EGD) WITH PROPOFOL;  Surgeon: Carol Ada, MD;  Location: [facility] ENDOSCOPY;   Service: Endoscopy;  Laterality: N/A;   HEMORRHOID SURGERY  10/30/2011   Procedure: HEMORRHOIDECTOMY;  Surgeon: Harl Bowie, MD;  Location: [place];  Service: General;  Laterality: N/A;   JOINT REPLACEMENT     pancreatic  cancer  05/10/2008   Resection of distal panrease and spleen   PARTIAL KNEE ARTHROPLASTY Right 11/19/2014   Procedure: RIGHT UNI-KNEE ARTHROPLASTY MEDIALLY;  Surgeon: Mauri Pole, MD;  Location: [facility] ORS;  Service: Orthopedics;  Laterality: Right;   PERIPHERAL VASCULAR CATHETERIZATION  Oct 2014   Lt SFA PTA   PERIPHERAL VASCULAR CATHETERIZATION  Oct 2015   ISR Lt SFA-PTA   right partial knee replacement     splenectomy     THROMBECTOMY FEMORAL ARTERY Left 09/13/2013   Procedure: THROMBECTOMY FEMORAL ARTERY;  Surgeon: Wellington Hampshire, MD;  Location: [place] CATH LAB;  Service: Cardiovascular;  Laterality: Left;   TRANSPHENOIDAL / TRANSNASAL HYPOPHYSECTOMY / RESECTION PITUITARY TUMOR  12/2018   [facility]     Current Outpatient Medications  Medication Sig Dispense Refill   aspirin EC 81 MG tablet Take 1 tablet (81 mg total) by mouth daily.     atorvastatin (LIPITOR) 40 MG tablet Take 1 tablet (40 mg total) by mouth daily. (Patient taking differently: Take 40 mg by mouth at bedtime. ) 90 tablet 3   DULoxetine HCl 60 MG CSDR Take 60 mg by mouth.     fish oil-omega-3 fatty acids 1000 MG capsule Take 1 capsule (1 g total) by mouth daily. 60 capsule 1   metFORMIN (GLUCOPHAGE) 500 MG tablet Take 1 tablet (500 mg total) by mouth daily with breakfast. 90 tablet 3   metoprolol tartrate (LOPRESSOR) 25 MG tablet Take 1 tablet by mouth two  times daily 60 tablet 0   Multiple Vitamin (MULTIVITAMIN) tablet Take 1 tablet by mouth daily. 30 tablet 10   pantoprazole (PROTONIX) 40 MG tablet TAKE 1 TABLET BY MOUTH DAILY 30 tablet 4   pregabalin (LYRICA) 150 MG capsule Take 1 capsule (150 mg total) by mouth daily. 30 capsule 0   sildenafil (VIAGRA) 100 MG tablet Take 0.5-1  tablets (50-100 mg total) by mouth daily as needed for erectile dysfunction. 5 tablet 11   tamsulosin (FLOMAX) 0.4 MG CAPS capsule TAKE 1 CAPSULE BY MOUTH DAILY. 30 capsule 2   ezetimibe (ZETIA) 10 MG tablet Take 1 tablet (10 mg total) by mouth daily. 90 tablet 3   No current facility-administered medications for this visit.    Allergies:   Patient has no known allergies.    Social History:  The patient  reports that he quit smoking about 11 years ago. His smoking use included cigarettes. He has a 18.00 pack-year smoking history. He has never used smokeless tobacco. He reports current alcohol use of about 3.0 standard drinks of alcohol per week. He reports that he does not use drugs.   Family History:  The patient's family history includes Cancer in his father; Heart attack in his father.    ROS:  Please see the history of present illness.   Otherwise, review of systems are positive for none.   All other systems are reviewed and negative.    PHYSICAL EXAM: VS:  BP 104/72    Pulse 62    Ht 5\' 9"  (1.753 m)    Wt 234 lb (106.1 kg)    SpO2 97%    BMI 34.56 kg/m  , BMI Body mass index is 34.56 kg/m. GEN: Well nourished, well developed, in no acute distress  HEENT: normal  Neck: no JVD, carotid bruits, or masses Cardiac: RRR; no murmurs, rubs, or gallops,no edema  Respiratory:  clear to auscultation bilaterally, normal work of breathing GI: soft, nontender, nondistended, + BS MS: no deformity or atrophy  Skin: warm and dry, no rash Neuro:  Strength and sensation are intact Psych: euthymic mood, full affect Distal pulses are not palpable.  EKG:  EKG is ordered today. EKG shows normal sinus rhythm with no significant ST or T wave changes.   Recent Labs: 01/08/2020: ALT 32; BUN 13; Creatinine, Ser 0.97; Potassium 5.0; Sodium 143    Lipid Panel    Component Value Date/Time   CHOL 158 12/08/2019 0931   TRIG 153 (H) 12/08/2019 0931   HDL 42 12/08/2019 0931   CHOLHDL 3.8  12/08/2019 0931   CHOLHDL 3.2 08/17/2016 0925   VLDL 28 08/17/2016 0925   LDLCALC 89 12/08/2019 0931   LDLDIRECT 80 11/06/2013 0925      Wt Readings from Last 3 Encounters:  07/30/20 234 lb (106.1 kg)  05/27/20 227 lb 9.6 oz (103.2 kg)  01/08/20 224 lb 14.4 oz (102 kg)        ASSESSMENT AND PLAN:   1. Peripheral arterial disease: Previous bilateral SFA intervention.  Currently denies any claudication and has no evidence of critical limb ischemia.  Continue medical therapy.  2. Peripheral diabetic neuropathy: Currently on Lyrica with improvement in symptoms.  3. Coronary artery disease involving native coronary arteries with stable angina: His current chest pain is not consistent with angina and seems to be musculoskeletal.  EKG is unremarkable.  4. Essential hypertension: Blood pressure is controlled on current medications.  5. Hyperlipidemia: Continue treatment with atorvastatin.  LDL improved gradually and most recent was 83.  Given his peripheral arterial disease and coronary artery disease we should try to get his LDL below 70 and thus I added Zetia 10 mg daily.  Recheck labs in 2 months.   Disposition:   FU with me in 6 months  Signed,  Kathlyn Sacramento, MD  07/30/2020 9:50 AM    Steven Hale

## 2020-08-10 LAB — HEPATIC FUNCTION PANEL
ALT: 18 IU/L (ref 0–44)
AST: 25 IU/L (ref 0–40)
Albumin: 4.8 g/dL (ref 3.8–4.8)
Alkaline Phosphatase: 102 IU/L (ref 44–121)
Bilirubin Total: 0.3 mg/dL (ref 0.0–1.2)
Bilirubin, Direct: 0.12 mg/dL (ref 0.00–0.40)
Total Protein: 7.2 g/dL (ref 6.0–8.5)

## 2020-08-10 LAB — LIPID PANEL
Chol/HDL Ratio: 3.2 ratio (ref 0.0–5.0)
Cholesterol, Total: 107 mg/dL (ref 100–199)
HDL: 33 mg/dL — ABNORMAL LOW (ref >39)
LDL Chol Calc (NIH): 49 mg/dL (ref 0–99)
Triglycerides: 146 mg/dL (ref 0–149)
VLDL Cholesterol Cal: 25 mg/dL (ref 5–40)

## 2020-08-15 MED FILL — DULoxetine HCL 30 MG CPEP: 30 | 30 days supply | Qty: 60 | Fill #0

## 2020-08-15 MED FILL — PREGABALIN 300 MG CAPS: 300 | 30 days supply | Qty: 60 | Fill #0

## 2020-08-17 MED FILL — TAMSULOSIN HCL 0.4 MG CAP: 0.4 | 30 days supply | Qty: 30 | Fill #1

## 2020-08-27 MED FILL — PANTOPRAZOLE SOD DR 40 MG T: 40 | 30 days supply | Qty: 30 | Fill #4

## 2020-09-17 MED FILL — TAMSULOSIN HCL 0.4 MG CAP: 0.4 | 30 days supply | Qty: 30 | Fill #2

## 2020-09-30 ENCOUNTER — Other Ambulatory Visit: Payer: Self-pay | Admitting: Cardiovascular Disease

## 2020-09-30 MED FILL — PANTOPRAZOLE SOD DR 40 MG T: 40 | 30 days supply | Qty: 30 | Fill #0

## 2020-09-30 NOTE — Telephone Encounter (Signed)
Rx has been sent to the pharmacy electronically. ° °

## 2020-10-01 ENCOUNTER — Other Ambulatory Visit: Payer: Self-pay

## 2020-10-01 MED ORDER — PANTOPRAZOLE SODIUM 40 MG PO TBEC
40.0000 mg | DELAYED_RELEASE_TABLET | Freq: Every day | ORAL | 10 refills | Status: DC
Start: 2020-10-01 — End: 2021-03-05

## 2020-10-19 MED FILL — EZETIMIBE 10 MG TABS: 10 | 90 days supply | Qty: 90 | Fill #1

## 2020-10-23 MED FILL — PANTOPRAZOLE SOD DR 40 MG T: 40 | 30 days supply | Qty: 30 | Fill #1

## 2020-11-20 ENCOUNTER — Other Ambulatory Visit: Payer: Self-pay | Admitting: Internal Medicine

## 2020-11-20 DIAGNOSIS — R911 Solitary pulmonary nodule: Secondary | ICD-10-CM

## 2020-11-25 MED FILL — PANTOPRAZOLE SOD DR 40 MG T: 40 | 30 days supply | Qty: 30 | Fill #2

## 2020-12-06 ENCOUNTER — Other Ambulatory Visit: Payer: Medicare Other

## 2020-12-26 MED FILL — PANTOPRAZOLE SOD DR 40 MG T: 40 | 30 days supply | Qty: 30 | Fill #3

## 2021-01-21 MED FILL — EZETIMIBE 10 MG TABS: 10 | 90 days supply | Qty: 90 | Fill #2

## 2021-01-26 MED FILL — PANTOPRAZOLE SOD DR 40 MG T: 40 | 30 days supply | Qty: 30 | Fill #4

## 2021-01-31 ENCOUNTER — Other Ambulatory Visit (HOSPITAL_COMMUNITY): Payer: Self-pay

## 2021-02-04 ENCOUNTER — Ambulatory Visit (INDEPENDENT_AMBULATORY_CARE_PROVIDER_SITE_OTHER): Payer: Medicare Other | Admitting: Cardiovascular Disease

## 2021-02-04 ENCOUNTER — Other Ambulatory Visit: Payer: Self-pay

## 2021-02-04 ENCOUNTER — Encounter: Payer: Self-pay | Admitting: Cardiovascular Disease

## 2021-02-04 VITALS — BP 140/72 | HR 71 | Ht 69.0 in | Wt 233.0 lb

## 2021-02-04 DIAGNOSIS — Z01812 Encounter for preprocedural laboratory examination: Secondary | ICD-10-CM | POA: Diagnosis not present

## 2021-02-04 DIAGNOSIS — E785 Hyperlipidemia, unspecified: Secondary | ICD-10-CM

## 2021-02-04 DIAGNOSIS — I10 Essential (primary) hypertension: Secondary | ICD-10-CM | POA: Diagnosis not present

## 2021-02-04 DIAGNOSIS — I2510 Atherosclerotic heart disease of native coronary artery without angina pectoris: Secondary | ICD-10-CM

## 2021-02-04 DIAGNOSIS — I739 Peripheral vascular disease, unspecified: Secondary | ICD-10-CM

## 2021-02-04 NOTE — H&P (View-Only) (Signed)
Cardiology Office Note   Date:  02/04/2021   ID:  Steven Hale., DOB [birth year], MRN [id number]  PCP:  Steven Moron, MD  Cardiologist:  Dr. Lionel Hale chief complaint on file.     History of Present Illness: Steven Hale. is a 65 y.o. male who presents for a followup visit regarding peripheral arterial disease and coronary artery disease. He has known history of coronary artery disease with multiple interventions in the past. He had CABG in 2011.  He quit smoking in 2009.  He had pituitary adenoma resection in February of this year. He has known history of peripheral arterial disease with intervention on both SFAs. Cardiac catheterization in January, 2017 showed mild nonobstructive LAD disease, occluded mid left circumflex and occluded proximal right coronary artery with left-to-right collaterals. SVG to RCA was occluded and LIMA to LAD was atretic. SVG to OM was normal. Abnormal stress test was due to chronically occluded right coronary artery and graft with left-to-right collaterals. His native RCA was not favorable for CTO PCI. I recommended medical therapy. Imdur was added.   Most recent noninvasive vascular studies in 2020 showed an ABI of 0.82 on the right and 1.01 on the left.  Duplex showed borderline significant stenosis in the common femoral artery and significant right mid SFA stenosis with patent stent without restenosis.Left SFA stent was patent without significant restenosis.  He had no significant claudication and this was treated medically.  His symptoms included more of chronic low back pain with radiation.   Since last visit, he reports severe right calf claudication after walking less than half a block.  The symptoms started few months ago and have been worsening.  He is having some rest pain at night when he is trying to sleep.  He reports stable symptoms of chest pain and shortness of breath and he seems to be mostly limited by his Steven  claudication.  Past Medical History:  Diagnosis Date  . Anemia, unspecified   . Bell's palsy   . Blood transfusion    during treatment for Ca  . Blood transfusion without reported diagnosis   . CAD (coronary artery disease)    a. s/p multiple PCIs;  b. s/p CABG in 09/2010 (LIMA-LAD, SVG-OM1, SVG-distal RCA/OM2);  Cardiac cath in 12/2015: Mild LAD disease, occluded mid LCX and proximal RCA (left to right collaterals), Occluded SVG to RCA and atretic LIMA. Patent SVG to OM  . Chronic back pain   . Diabetes mellitus without complication ([facility])   . DJD (degenerative joint disease)    low back & all over   . GERD (gastroesophageal reflux disease)   . Helicobacter pylori (H. pylori) infection   . Hemorrhoids   . Hyperlipidemia   . Hypertension   . Impotence of organic origin   . Myocardial infarction (Steven Hale)    2005 and 2012   . PAD (peripheral artery disease) ([facility])    a. s/p bilat SFA stents;  b. ABIs 11/2012: R 0.99, L 0.86.  [name] Pancreatic cancer [facility])    surgery 2009  . Pituitary adenoma [facility])    surgery at [facility] Feb 2020  . Pre-diabetes 08/16/2015  . Sleep apnea 06/05/2019    Past Surgical History:  Procedure Laterality Date  . ABDOMINAL AORTAGRAM N/A 09/13/2013   Procedure: ABDOMINAL [facility];  Surgeon: Steven Hampshire, MD;  Location: [facility];  Service: Cardiovascular;  Laterality: N/A;  . ABDOMINAL AORTAGRAM N/A 08/29/2014   Procedure: ABDOMINAL AORTAGRAM;  Surgeon:  Steven Hampshire, MD;  Location: [facility];  Service: Cardiovascular;  Laterality: N/A;  . BACK SURGERY     1992  . CARDIAC CATHETERIZATION N/A 12/25/2015   Procedure: Left Heart Cath and Cors/Grafts Angiography;  Surgeon: Steven Hampshire, MD;  Location: [facility];  Service: Cardiovascular;  Laterality: N/A;  . CORONARY ARTERY BYPASS GRAFT  nov 2011   x 4  . ESOPHAGOGASTRODUODENOSCOPY (EGD) WITH PROPOFOL N/A 04/02/2017   Procedure: ESOPHAGOGASTRODUODENOSCOPY (EGD) WITH PROPOFOL;  Surgeon: Carol Ada,  MD;  Location: [facility];  Service: [facility];  Laterality: N/A;  . HEMORRHOID SURGERY  10/30/2011   Procedure: HEMORRHOIDECTOMY;  Surgeon: Harl Bowie, MD;  Location: [facility];  Service: General;  Laterality: N/A;  . JOINT REPLACEMENT    . pancreatic  cancer  05/10/2008   Resection of distal panrease and spleen  . PARTIAL KNEE ARTHROPLASTY Right 11/19/2014   Procedure: RIGHT UNI-KNEE ARTHROPLASTY MEDIALLY;  Surgeon: Mauri Pole, MD;  Location: [facility];  Service: Orthopedics;  Laterality: Right;  . PERIPHERAL VASCULAR CATHETERIZATION  Oct 2014   Lt SFA PTA  . PERIPHERAL VASCULAR CATHETERIZATION  Oct 2015   ISR Lt SFA-PTA  . right partial knee replacement    . splenectomy    . THROMBECTOMY FEMORAL ARTERY Left 09/13/2013   Procedure: THROMBECTOMY FEMORAL ARTERY;  Surgeon: Steven Hampshire, MD;  Location: [facility];  Service: Cardiovascular;  Laterality: Left;  . TRANSPHENOIDAL / TRANSNASAL HYPOPHYSECTOMY / RESECTION PITUITARY TUMOR  12/2018   [facility]     Current Outpatient Medications  Medication Sig Dispense Refill  . aspirin EC 81 MG tablet Take 1 tablet (81 mg total) by mouth daily.    [name] atorvastatin (LIPITOR) 40 MG tablet Take 1 tablet (40 mg total) by mouth daily. (Patient taking differently: Take 40 mg by mouth at bedtime.) 90 tablet 3  . DULoxetine HCl 60 MG CSDR Take 60 mg by mouth.    . fish oil-omega-3 fatty acids 1000 MG capsule Take 1 capsule (1 g total) by mouth daily. 60 capsule 1  . metFORMIN (GLUCOPHAGE) 500 MG tablet Take 1 tablet (500 mg total) by mouth daily with breakfast. 90 tablet 3  . metoprolol tartrate (LOPRESSOR) 25 MG tablet Take 1 tablet by mouth two  times daily 60 tablet 0  . Multiple Vitamin (MULTIVITAMIN) tablet Take 1 tablet by mouth daily. 30 tablet 10  . pantoprazole (PROTONIX) 40 MG tablet Take 1 tablet (40 mg total) by mouth daily. 30 tablet 10  . pregabalin (LYRICA) 150 MG capsule Take 1 capsule (150 mg total) by mouth daily. 30 capsule 0  .  sildenafil (VIAGRA) 100 MG tablet Take 0.5-1 tablets (50-100 mg total) by mouth daily as needed for erectile dysfunction. 5 tablet 11  . tamsulosin (FLOMAX) 0.4 MG CAPS capsule TAKE 1 CAPSULE BY MOUTH DAILY. 30 capsule 2  . ezetimibe (ZETIA) 10 MG tablet Take 1 tablet (10 mg total) by mouth daily. 90 tablet 3   No current facility-administered medications for this visit.    Allergies:   Patient has no allergy information on record.    Social History:  The patient  reports that he quit smoking about 12 years ago. His smoking use included cigarettes. He has a 18.00 pack-year smoking history. He has never used smokeless tobacco. He reports current alcohol use of about 3.0 standard drinks of alcohol per week. He reports that he does not use drugs.   Family History:  The patient's family history  includes Cancer in his father; Heart attack in his father.    ROS:  Please see the history of present illness.   Otherwise, review of systems are positive for none.   All other systems are reviewed and negative.    PHYSICAL EXAM: VS:  BP 140/72   Pulse 71   Ht 5\' 9"  (1.753 m)   Wt 233 lb (105.7 kg)   SpO2 95%   BMI 34.41 kg/m  , BMI Body mass index is 34.41 kg/m. GEN: Well nourished, well developed, in no acute distress  HEENT: normal  Neck: no JVD, carotid bruits, or masses Cardiac: RRR; no murmurs, rubs, or gallops,no edema  Respiratory:  clear to auscultation bilaterally, normal work of breathing GI: soft, nontender, nondistended, + BS MS: no deformity or atrophy  Skin: warm and dry, no rash Neuro:  Strength and sensation are intact Psych: euthymic mood, full affect Femoral: +1 on the right and +2 on the left.  Distal pulses are palpable on the left side but not the right side.  EKG:  EKG is not ordered today.    Recent Labs: 08/09/2020: ALT 18    Lipid Panel    Component Value Date/Time   CHOL 107 08/09/2020 1549   TRIG 146 08/09/2020 1549   HDL 33 (L) 08/09/2020 1549    CHOLHDL 3.2 08/09/2020 1549   CHOLHDL 3.2 08/17/2016 0925   VLDL 28 08/17/2016 0925   LDLCALC 49 08/09/2020 1549   LDLDIRECT 80 11/06/2013 0925      Wt Readings from Last 3 Encounters:  02/04/21 233 lb (105.7 kg)  07/30/20 234 lb (106.1 kg)  05/27/20 227 lb 9.6 oz (103.2 kg)        ASSESSMENT AND PLAN:   1. Peripheral arterial disease: Previous bilateral SFA intervention in 2014 in 2015.  He is now presenting with severe right calf claudication and rest pain at night.  The symptoms started few months ago and has been getting worse.  Given severity of his symptoms, recommend proceeding with abdominal aortogram with lower extremity runoff and possible endovascular intervention.  I discussed the procedure in details as well as risks and benefits.  I will obtain an ABI and lower extremity arterial duplex before the procedure.  2. Peripheral diabetic neuropathy: Currently on Lyrica with improvement in symptoms.  3. Coronary artery disease involving native coronary arteries with stable angina: Chronic atypical chest pain.  He seems to be mostly bothered by his claudication at the present time.  4. Essential hypertension: Blood pressure is controlled on current medications.  5. Hyperlipidemia: Zetia was added last year to atorvastatin with subsequent lipid profile showed significant improvement in LDL down to 49.   Disposition:   FU with me in 1 month.  Signed,  Kathlyn Sacramento, MD  02/04/2021 11:35 AM    [place]

## 2021-02-04 NOTE — Patient Instructions (Signed)
Medication Instructions:  No Changes In Medications at this time.  *If you need a refill on your cardiac medications before your next appointment, please call your pharmacy*  Lab Work: BMET, CBC> TODAY  If you have labs (blood work) drawn today and your tests are completely normal, you will receive your results only by: [name] MyChart Message (if you have MyChart) OR . A paper copy in the mail If you have any lab test that is abnormal or we need to change your treatment, we will call you to review the results.  Testing/Procedures:    [address] [place] [address] Dept: [phone] Loc: [place].  02/04/2021  You are scheduled for a Peripheral Angiogram on Wednesday, April 6 with Dr. Kathlyn Sacramento.  1. Please arrive at the [facility] (Main Entrance A) at [facility]: [address] [place], [place] [address] at 6:30 AM (This time is two hours before your procedure to ensure your preparation). Free valet parking service is available.   Special note: Every effort is made to have your procedure done on time. Please understand that emergencies sometimes delay scheduled procedures.  2. Diet: Do not eat solid foods after midnight.  The patient may have clear liquids until 5am upon the day of the procedure.  3. Labs: You will need to have blood drawn on Tuesday, March 22 at [place], [place]  Open: [place] (Lunch 12:30 - 1:30)   Phone: [phone]. You do not need to be fasting.  You will need a COVID-19  test prior to your procedure. You are scheduled for 12:05 PM ON Monday April 4th. This is a Drive Up Visit at [address] [place], [place] [address]. Someone will direct you to the appropriate testing line. Stay in your car and someone will be with you shortly.  4. Medication instructions in preparation for your  procedure:   Contrast Allergy: No  Do not take Diabetes Med Glucophage (Metformin) on the day of the procedure and HOLD 48 HOURS AFTER THE PROCEDURE.  On the morning of your procedure, take your Aspirin and any morning medicines NOT listed above.  You may use sips of water.  5. Plan for one night stay--bring personal belongings. 6. Bring a current list of your medications and current insurance cards. 7. You MUST have a responsible person to drive you home. 8. Someone MUST be with you the first 24 hours after you arrive home or your discharge will be delayed. 9. Please wear clothes that are easy to get on and off and wear slip-on shoes.  Thank you for allowing [place] to care for you!   -- [facility]  Your physician has requested that you have a lower extremity arterial duplex BEFORE April 6th. During this test, ultrasound is used to evaluate arterial blood flow in the legs. Allow one hour for this exam. There are no restrictions or special instructions. This will take place at [place], [address].  Your physician has requested that you have an ankle brachial index (ABI) PLEASE HAVE THIS BEFORE April 6th. During this test an ultrasound and blood pressure cuff are used to evaluate the arteries that supply the arms and legs with blood. Allow thirty minutes for this exam. There are no restrictions or special instructions.  Follow-Up: At [facility], you and your health needs are our priority.  As part of our continuing mission to provide you with exceptional heart care, we have created designated Provider Care Teams.  These Care Teams include your primary Cardiologist (physician) and Advanced Practice Providers (APPs -  Physician Assistants and Nurse Practitioners) who all work together to provide you with the care you need, when you need it.  Your next appointment:   1 month(s)  The format for your next appointment:   In Person  Provider:    Kathlyn Sacramento, MD

## 2021-02-04 NOTE — Progress Notes (Signed)
Cardiology Office Note   Date:  02/04/2021   ID:  Steven Hale., DOB Oct 21, 1956, MRN [id number]  PCP:  [name], MD  Cardiologist:  Dr. Lionel December chief complaint on file.     History of Present Illness: Steven Hale. is a 65 y.o. male who presents for a followup visit regarding peripheral arterial disease and coronary artery disease. He has known history of coronary artery disease with multiple interventions in the past. He had CABG in 2011.  He quit smoking in 2009.  He had pituitary adenoma resection in February of this year. He has known history of peripheral arterial disease with intervention on both SFAs. Cardiac catheterization in January, 2017 showed mild nonobstructive LAD disease, occluded mid left circumflex and occluded proximal right coronary artery with left-to-right collaterals. SVG to RCA was occluded and LIMA to LAD was atretic. SVG to OM was normal. Abnormal stress test was due to chronically occluded right coronary artery and graft with left-to-right collaterals. His native RCA was not favorable for CTO PCI. I recommended medical therapy. Imdur was added.   Most recent noninvasive vascular studies in 2020 showed an ABI of 0.82 on the right and 1.01 on the left.  Duplex showed borderline significant stenosis in the common femoral artery and significant right mid SFA stenosis with patent stent without restenosis.Left SFA stent was patent without significant restenosis.  He had no significant claudication and this was treated medically.  His symptoms included more of chronic low back pain with radiation.   Since last visit, he reports severe right calf claudication after walking less than half a block.  The symptoms started few months ago and have been worsening.  He is having some rest pain at night when he is trying to sleep.  He reports stable symptoms of chest pain and shortness of breath and he seems to be mostly limited by his new  claudication.  Past Medical History:  Diagnosis Date  . Anemia, unspecified   . Bell's palsy   . Blood transfusion    during treatment for Ca  . Blood transfusion without reported diagnosis   . CAD (coronary artery disease)    a. s/p multiple PCIs;  b. s/p CABG in 09/2010 (LIMA-LAD, SVG-OM1, SVG-distal RCA/OM2);  Cardiac cath in 12/2015: Mild LAD disease, occluded mid LCX and proximal RCA (left to right collaterals), Occluded SVG to RCA and atretic LIMA. Patent SVG to OM  . Chronic back pain   . Diabetes mellitus without complication ([place])   . DJD (degenerative joint disease)    low back & all over   . GERD (gastroesophageal reflux disease)   . Helicobacter pylori (H. pylori) infection   . Hemorrhoids   . Hyperlipidemia   . Hypertension   . Impotence of organic origin   . Myocardial infarction ([place])    2005 and 2012   . PAD (peripheral artery disease) ([place])    a. s/p bilat SFA stents;  b. ABIs 11/2012: R 0.99, L 0.86.  [name] Pancreatic cancer [facility])    surgery 2009  . Pituitary adenoma [facility])    surgery at [facility] Feb [facility]  . Pre-diabetes 08/16/2015  . Sleep apnea 06/05/2019    Past Surgical History:  Procedure Laterality Date  . ABDOMINAL AORTAGRAM N/A 09/13/2013   Procedure: ABDOMINAL Maxcine Ham;  Surgeon: [place] Hampshire, MD;  Location: [place] CATH LAB;  Service: Cardiovascular;  Laterality: N/A;  . ABDOMINAL AORTAGRAM N/A 08/29/2014   Procedure: ABDOMINAL AORTAGRAM;  Surgeon:  Wellington Hampshire, MD;  Location: [place] CATH LAB;  Service: Cardiovascular;  Laterality: N/A;  . BACK SURGERY     1992  . CARDIAC CATHETERIZATION N/A 12/25/2015   Procedure: Left Heart Cath and Cors/Grafts Angiography;  Surgeon: [place] Hampshire, MD;  Location: [place] CV LAB;  Service: Cardiovascular;  Laterality: N/A;  . CORONARY ARTERY BYPASS GRAFT  nov 2011   x 4  . ESOPHAGOGASTRODUODENOSCOPY (EGD) WITH PROPOFOL N/A 04/02/2017   Procedure: ESOPHAGOGASTRODUODENOSCOPY (EGD) WITH PROPOFOL;  Surgeon: Carol Ada,  MD;  Location: [facility] ENDOS[facility];  Service: Endoscopy;  Laterality: N/A;  . HEMORRHOID SURGERY  10/30/2011   Procedure: HEMORRHOIDECTOMY;  Surgeon: Harl Bowie, MD;  Location: [place];  Service: General;  Laterality: N/A;  . JOINT REPLACEMENT    . pancreatic  cancer  05/10/2008   Resection of distal panrease and spleen  . PARTIAL KNEE ARTHROPLASTY Right 11/19/2014   Procedure: RIGHT UNI-KNEE ARTHROPLASTY MEDIALLY;  Surgeon: Mauri Pole, MD;  Location: [facility];  Service: Orthopedics;  Laterality: Right;  . PERIPHERAL VASCULAR CATHETERIZATION  Oct 2014   Lt SFA PTA  . PERIPHERAL VASCULAR CATHETERIZATION  Oct 2015   ISR Lt SFA-PTA  . right partial knee replacement    . splenectomy    . THROMBECTOMY FEMORAL ARTERY Left 09/13/2013   Procedure: THROMBECTOMY FEMORAL ARTERY;  Surgeon: Wellington Hampshire, MD;  Location: [place] CATH LAB;  Service: Cardiovascular;  Laterality: Left;  . TRANSPHENOIDAL / TRANSNASAL HYPOPHYSECTOMY / RESECTION PITUITARY TUMOR  12/2018   [facility]     Current Outpatient Medications  Medication Sig Dispense Refill  . aspirin EC 81 MG tablet Take 1 tablet (81 mg total) by mouth daily.    [name] atorvastatin (LIPITOR) 40 MG tablet Take 1 tablet (40 mg total) by mouth daily. (Patient taking differently: Take 40 mg by mouth at bedtime.) 90 tablet 3  . DULoxetine HCl 60 MG CSDR Take 60 mg by mouth.    . fish oil-omega-3 fatty acids 1000 MG capsule Take 1 capsule (1 g total) by mouth daily. 60 capsule 1  . metFORMIN (GLUCOPHAGE) 500 MG tablet Take 1 tablet (500 mg total) by mouth daily with breakfast. 90 tablet 3  . metoprolol tartrate (LOPRESSOR) 25 MG tablet Take 1 tablet by mouth two  times daily 60 tablet 0  . Multiple Vitamin (MULTIVITAMIN) tablet Take 1 tablet by mouth daily. 30 tablet 10  . pantoprazole (PROTONIX) 40 MG tablet Take 1 tablet (40 mg total) by mouth daily. 30 tablet 10  . pregabalin (LYRICA) 150 MG capsule Take 1 capsule (150 mg total) by mouth daily. 30 capsule 0  .  sildenafil (VIAGRA) 100 MG tablet Take 0.5-1 tablets (50-100 mg total) by mouth daily as needed for erectile dysfunction. 5 tablet 11  . tamsulosin (FLOMAX) 0.4 MG CAPS capsule TAKE 1 CAPSULE BY MOUTH DAILY. 30 capsule 2  . ezetimibe (ZETIA) 10 MG tablet Take 1 tablet (10 mg total) by mouth daily. 90 tablet 3   No current facility-administered medications for this visit.    Allergies:   Patient has no allergy information on record.    Social History:  The patient  reports that he quit smoking about 12 years ago. His smoking use included cigarettes. He has a 18.00 pack-year smoking history. He has never used smokeless tobacco. He reports current alcohol use of about 3.0 standard drinks of alcohol per week. He reports that he does not use drugs.   Family History:  The patient's family history  includes Cancer in his father; Heart attack in his father.    ROS:  Please see the history of present illness.   Otherwise, review of systems are positive for none.   All other systems are reviewed and negative.    PHYSICAL EXAM: VS:  BP 140/72   Pulse 71   Ht 5\' 9"  (1.753 m)   Wt 233 lb (105.7 kg)   SpO2 95%   BMI 34.41 kg/m  , BMI Body mass index is 34.41 kg/m. GEN: Well nourished, well developed, in no acute distress  HEENT: normal  Neck: no JVD, carotid bruits, or masses Cardiac: RRR; no murmurs, rubs, or gallops,no edema  Respiratory:  clear to auscultation bilaterally, normal work of breathing GI: soft, nontender, nondistended, + BS MS: no deformity or atrophy  Skin: warm and dry, no rash Neuro:  Strength and sensation are intact Psych: euthymic mood, full affect Femoral: +1 on the right and +2 on the left.  Distal pulses are palpable on the left side but not the right side.  EKG:  EKG is not ordered today.    Recent Labs: 08/09/2020: ALT 18    Lipid Panel    Component Value Date/Time   CHOL 107 08/09/2020 1549   TRIG 146 08/09/2020 1549   HDL 33 (L) 08/09/2020 1549    CHOLHDL 3.2 08/09/2020 1549   CHOLHDL 3.2 08/17/2016 0925   VLDL 28 08/17/2016 0925   LDLCALC 49 08/09/2020 1549   LDLDIRECT 80 11/06/2013 0925      Wt Readings from Last 3 Encounters:  02/04/21 233 lb (105.7 kg)  07/30/20 234 lb (106.1 kg)  05/27/20 227 lb 9.6 oz (103.2 kg)        ASSESSMENT AND PLAN:   1. Peripheral arterial disease: Previous bilateral SFA intervention in 2014 in 2015.  He is now presenting with severe right calf claudication and rest pain at night.  The symptoms started few months ago and has been getting worse.  Given severity of his symptoms, recommend proceeding with abdominal aortogram with lower extremity runoff and possible endovascular intervention.  I discussed the procedure in details as well as risks and benefits.  I will obtain an ABI and lower extremity arterial duplex before the procedure.  2. Peripheral diabetic neuropathy: Currently on Lyrica with improvement in symptoms.  3. Coronary artery disease involving native coronary arteries with stable angina: Chronic atypical chest pain.  He seems to be mostly bothered by his claudication at the present time.  4. Essential hypertension: Blood pressure is controlled on current medications.  5. Hyperlipidemia: Zetia was added last year to atorvastatin with subsequent lipid profile showed significant improvement in LDL down to 49.   Disposition:   FU with me in 1 month.  Signed,  Kathlyn Sacramento, MD  02/04/2021 11:35 AM    [place]

## 2021-02-05 LAB — CBC
Hematocrit: 38.8 % (ref 37.5–51.0)
Hemoglobin: 12.9 g/dL — ABNORMAL LOW (ref 13.0–17.7)
MCH: 29.1 pg (ref 26.6–33.0)
MCHC: 33.2 g/dL (ref 31.5–35.7)
MCV: 87 fL (ref 79–97)
Platelets: 284 10*3/uL (ref 150–450)
RBC: 4.44 x10E6/uL (ref 4.14–5.80)
RDW: 13.7 % (ref 11.6–15.4)
WBC: 4.7 10*3/uL (ref 3.4–10.8)

## 2021-02-05 LAB — BASIC METABOLIC PANEL
BUN/Creatinine Ratio: 13 (ref 10–24)
BUN: 11 mg/dL (ref 8–27)
CO2: 22 mmol/L (ref 20–29)
Calcium: 10.3 mg/dL — ABNORMAL HIGH (ref 8.6–10.2)
Chloride: 102 mmol/L (ref 96–106)
Creatinine, Ser: 0.86 mg/dL (ref 0.76–1.27)
Glucose: 118 mg/dL — ABNORMAL HIGH (ref 65–99)
Potassium: 4.5 mmol/L (ref 3.5–5.2)
Sodium: 142 mmol/L (ref 134–144)
eGFR: 97 mL/min/{1.73_m2} (ref >59)

## 2021-02-10 ENCOUNTER — Telehealth: Payer: Self-pay | Admitting: Cardiovascular Disease

## 2021-02-10 NOTE — Telephone Encounter (Signed)
Unclear as to who called the patient.

## 2021-02-10 NOTE — Telephone Encounter (Signed)
Patient states he is returning a call from our office but I'm not sure who called. He has a doppler scheduled for tomorrow and then a cath for 04/06. Reached out to [place] but she did not call pt either. Please advise.

## 2021-02-11 ENCOUNTER — Other Ambulatory Visit: Payer: Self-pay

## 2021-02-11 ENCOUNTER — Ambulatory Visit (HOSPITAL_COMMUNITY)
Admission: RE | Admit: 2021-02-11 | Discharge: 2021-02-11 | Disposition: A | Discharge status: Home / Self Care | Payer: Medicare Other | Source: Ambulatory Visit | Attending: Internal Medicine | Admitting: Internal Medicine

## 2021-02-11 DIAGNOSIS — I739 Peripheral vascular disease, unspecified: Secondary | ICD-10-CM | POA: Insufficient documentation

## 2021-02-12 ENCOUNTER — Ambulatory Visit (INDEPENDENT_AMBULATORY_CARE_PROVIDER_SITE_OTHER): Payer: Medicare Other | Admitting: Cardiovascular Disease

## 2021-02-12 ENCOUNTER — Encounter: Payer: Self-pay | Admitting: Cardiovascular Disease

## 2021-02-12 VITALS — BP 110/70 | HR 67 | Ht 69.0 in | Wt 233.0 lb

## 2021-02-12 DIAGNOSIS — I739 Peripheral vascular disease, unspecified: Secondary | ICD-10-CM | POA: Diagnosis not present

## 2021-02-12 DIAGNOSIS — E119 Type 2 diabetes mellitus without complications: Secondary | ICD-10-CM

## 2021-02-12 DIAGNOSIS — I25810 Atherosclerosis of coronary artery bypass graft(s) without angina pectoris: Secondary | ICD-10-CM

## 2021-02-12 DIAGNOSIS — M791 Myalgia, unspecified site: Secondary | ICD-10-CM | POA: Diagnosis not present

## 2021-02-12 DIAGNOSIS — E785 Hyperlipidemia, unspecified: Secondary | ICD-10-CM

## 2021-02-12 DIAGNOSIS — I10 Essential (primary) hypertension: Secondary | ICD-10-CM

## 2021-02-12 NOTE — Progress Notes (Signed)
Cardiology Office Note:    Date:  02/12/2021   ID:  Steven Hale., DOB 07-Jul-1956, MRN [id number]  PCP:  [name], MD   [place]  Cardiologist:  No primary care provider on file.  Advanced Practice Provider:  No care team member to display Electrophysiologist:  None       Referring MD: [name], MD   Chief Complaint  Patient presents with  . Leg Pain    History of Present Illness:    Steven Hale. is a 65 y.o. male with a hx of CAD status post CABG, PAD with previous revascularization and evidence of recurrent stenosis in the right lower extremity, diabetes mellitus, dyslipidemia, hypertension, obesity, presenting with complaints of severe bilateral leg pain that kept him awake all night.  He reports that the symptoms began shortly after he underwent his bilateral lower extremity arterial Doppler yesterday.  He also received the Shingrix vaccine 2 days ago.  He describes discomfort running down both legs with feelings of electrical shocks in his feet and in his hands bilaterally.  When he lies in bed, he feels that his whole body is pulsating and hurting.  Feels a little better when he stands up and walks around.  Has not had fever or chills or headache.  Denies any chest discomfort either at rest or with activity.  He has claudication after just a few yards in his right lower extremity.  The arterial Dopplers showed a high-grade stenosis in the proximal right common femoral artery as well as stenosis within the mid-distal superficial femoral artery stent.  Scheduled for angiography with Dr. Fletcher Anon next week.  Past Medical History:  Diagnosis Date  . Anemia, unspecified   . Bell's palsy   . Blood transfusion    during treatment for Ca  . Blood transfusion without reported diagnosis   . CAD (coronary artery disease)    a. s/p multiple PCIs;  b. s/p CABG in 09/2010 (LIMA-LAD, SVG-OM1, SVG-distal RCA/OM2);  Cardiac cath in 12/2015:  Mild LAD disease, occluded mid LCX and proximal RCA (left to right collaterals), Occluded SVG to RCA and atretic LIMA. Patent SVG to OM  . Chronic back pain   . Diabetes mellitus without complication ([place])   . DJD (degenerative joint disease)    low back & all over   . GERD (gastroesophageal reflux disease)   . Helicobacter pylori (H. pylori) infection   . Hemorrhoids   . Hyperlipidemia   . Hypertension   . Impotence of organic origin   . Myocardial infarction ([place])    2005 and 2012   . PAD (peripheral artery disease) ([place])    a. s/p bilat SFA stents;  b. ABIs 11/2012: R 0.99, L 0.86.  [name] Pancreatic cancer [facility])    surgery 2009  . Pituitary [facility])    surgery at [facility] Feb 2020  . Pre-diabetes 08/16/2015  . Sleep apnea 06/05/2019    Past Surgical History:  Procedure Laterality Date  . ABDOMINAL AORTAGRAM N/A 09/13/2013   Procedure: ABDOMINAL Maxcine Ham;  Surgeon: Wellington Hampshire, MD;  Location: [place] CATH LAB;  Service: Cardiovascular;  Laterality: N/A;  . ABDOMINAL AORTAGRAM N/A 08/29/2014   Procedure: ABDOMINAL Maxcine Ham;  Surgeon: Wellington Hampshire, MD;  Location: [place] CATH LAB;  Service: Cardiovascular;  Laterality: N/A;  . BACK SURGERY     1992  . CARDIAC CATHETERIZATION N/A 12/25/2015   Procedure: Left Heart Cath and Cors/Grafts Angiography;  Surgeon: Wellington Hampshire,  MD;  Location: [place] CV LAB;  Service: Cardiovascular;  Laterality: N/A;  . CORONARY ARTERY BYPASS GRAFT  nov 2011   x 4  . ESOPHAGOGASTRODUODENOSCOPY (EGD) WITH PROPOFOL N/A 04/02/2017   Procedure: ESOPHAGOGASTRODUODENOSCOPY (EGD) WITH PROPOFOL;  Surgeon: Carol Ada, MD;  Location: [facility] ENDOSCOPY;  Service: Endoscopy;  Laterality: N/A;  . HEMORRHOID SURGERY  10/30/2011   Procedure: HEMORRHOIDECTOMY;  Surgeon: Harl Bowie, MD;  Location: [place];  Service: General;  Laterality: N/A;  . JOINT REPLACEMENT    . pancreatic  cancer  05/10/2008   Resection of distal panrease and spleen  . PARTIAL KNEE  ARTHROPLASTY Right 11/19/2014   Procedure: RIGHT UNI-KNEE ARTHROPLASTY MEDIALLY;  Surgeon: Mauri Pole, MD;  Location: [facility] ORS;  Service: Orthopedics;  Laterality: Right;  . PERIPHERAL VASCULAR CATHETERIZATION  Oct 2014   Lt SFA PTA  . PERIPHERAL VASCULAR CATHETERIZATION  Oct 2015   ISR Lt SFA-PTA  . right partial knee replacement    . splenectomy    . THROMBECTOMY FEMORAL ARTERY Left 09/13/2013   Procedure: THROMBECTOMY FEMORAL ARTERY;  Surgeon: Wellington Hampshire, MD;  Location: [place] CATH LAB;  Service: Cardiovascular;  Laterality: Left;  . TRANSPHENOIDAL / TRANSNASAL HYPOPHYSECTOMY / RESECTION PITUITARY TUMOR  12/2018   [facility]    Current Medications: Current Meds  Medication Sig  . aspirin EC 81 MG tablet Take 1 tablet (81 mg total) by mouth daily.  [name] atorvastatin (LIPITOR) 40 MG tablet Take 1 tablet (40 mg total) by mouth daily. (Patient taking differently: Take 40 mg by mouth at bedtime.)  . DULoxetine HCl 30 MG CSDR Take 30 mg by mouth 2 (two) times daily.  . fish oil-omega-3 fatty acids 1000 MG capsule Take 1 capsule (1 g total) by mouth daily.  [name] levothyroxine (SYNTHROID) 25 MCG tablet Take 25 mcg by mouth daily.  [name] lisinopril (ZESTRIL) 2.5 MG tablet Take 2.5 mg by mouth daily.  . metFORMIN (GLUCOPHAGE) 500 MG tablet Take 1 tablet (500 mg total) by mouth daily with breakfast. (Patient taking differently: Take 500 mg by mouth 2 (two) times daily with a meal.)  . metoprolol tartrate (LOPRESSOR) 25 MG tablet Take 1 tablet by mouth two  times daily (Patient taking differently: Take 25 mg by mouth 2 (two) times daily.)  . Multiple Vitamin (MULTIVITAMIN) tablet Take 1 tablet by mouth daily.  . nitroGLYCERIN (NITROSTAT) 0.4 MG SL tablet Place 0.4 mg under the tongue every 2 (two) hours as needed for chest pain.  . pantoprazole (PROTONIX) 40 MG tablet Take 1 tablet (40 mg total) by mouth daily.  . polyethylene glycol (MIRALAX / GLYCOLAX) 17 g packet Take 17 g by mouth daily as needed for mild  constipation or moderate constipation.  . pregabalin (LYRICA) 150 MG capsule Take 1 capsule (150 mg total) by mouth daily. (Patient taking differently: Take 300 mg by mouth 2 (two) times daily.)  . Propylene Glycol (SYSTANE BALANCE) 0.6 % SOLN Apply 1 drop to eye 4 (four) times daily.  . sildenafil (VIAGRA) 100 MG tablet Take 0.5-1 tablets (50-100 mg total) by mouth daily as needed for erectile dysfunction.  . tamsulosin (FLOMAX) 0.4 MG CAPS capsule TAKE 1 CAPSULE BY MOUTH DAILY. (Patient taking differently: Take 0.4 mg by mouth at bedtime.)     Allergies:   Patient has no known allergies.   Social History   Socioeconomic History  . Marital status: Single    Spouse name: Not on file  . Number of children: 0  .  Years of education: Not on file  . Highest education level: Not on file  Occupational History  . Occupation: TEFL teacher: UNEMPLOYED  Tobacco Use  . Smoking status: Former Smoker    Packs/day: 1.00    Years: 18.00    Pack years: 18.00    Types: Cigarettes    Quit date: 11/29/2008    Years since quitting: 12.2  . Smokeless tobacco: Never Used  Substance and Sexual Activity  . Alcohol use: Yes    Alcohol/week: 3.0 standard drinks    Types: 3 Shots of liquor per week    Comment: 2 drinks of brandy every week   . Drug use: No  . Sexual activity: Not Currently  Other Topics Concern  . Not on file  Social History Narrative   He works at the night shift at [employer] part time   [employer] [phone]   Single, no children   Social Determinants of Radio broadcast assistant Strain: Not on file  Food Insecurity: Not on file  Transportation Needs: Not on file  Physical Activity: Not on file  Stress: Not on file  Social Connections: Not on file     Family History: The patient's family history includes Cancer in his father; Heart attack in his father. There is no history of Anesthesia problems, Hypotension, Malignant hyperthermia, Pseudochol deficiency, Colon  cancer, Esophageal cancer, Prostate cancer, Rectal cancer, or Stomach cancer.  ROS:   Please see the history of present illness.     All other systems reviewed and are negative.  EKGs/Labs/Other Studies Reviewed:    The following studies were reviewed today: Recent arterial Doppler study  EKG:  EKG is ordered today.  The ekg ordered today demonstrates normal sinus rhythm, normal tracing  Recent Labs: 08/09/2020: ALT 18 02/04/2021: BUN 11; Creatinine, Ser 0.86; Hemoglobin 12.9; Platelets 284; Potassium 4.5; Sodium 142  Recent Lipid Panel    Component Value Date/Time   CHOL 107 08/09/2020 1549   TRIG 146 08/09/2020 1549   HDL 33 (L) 08/09/2020 1549   CHOLHDL 3.2 08/09/2020 1549   CHOLHDL 3.2 08/17/2016 0925   VLDL 28 08/17/2016 0925   LDLCALC 49 08/09/2020 1549   LDLDIRECT 80 11/06/2013 0925     Risk Assessment/Calculations:       Physical Exam:    VS:  BP 110/70   Pulse 67   Ht 5\' 9"  (1.753 m)   Wt 233 lb (105.7 kg)   BMI 34.41 kg/m     Wt Readings from Last 3 Encounters:  02/12/21 233 lb (105.7 kg)  02/04/21 233 lb (105.7 kg)  07/30/20 234 lb (106.1 kg)     GEN: Obese. Well nourished, well developed in no acute distress HEENT: Normal NECK: No JVD; No carotid bruits LYMPHATICS: No lymphadenopathy CARDIAC: RRR, no murmurs, rubs, gallops RESPIRATORY:  Clear to auscultation without rales, wheezing or rhonchi  ABDOMEN: Soft, non-tender, non-distended MUSCULOSKELETAL:  No edema; No deformity  SKIN: Warm and dry NEUROLOGIC:  Alert and oriented x 3 PSYCHIATRIC:  Normal affect   ASSESSMENT:    1. Myalgia   2. PAD (peripheral artery disease) ([place])   3. Non-insulin dependent type 2 diabetes mellitus ([place])   4. Dyslipidemia (high LDL; low HDL)   5. Essential hypertension   6. Coronary artery disease involving coronary bypass graft of native heart without angina pectoris    PLAN:    In order of problems listed above:  1. Generalized myalgia: Strongly suspect  this is  a side effect from the Shingrix vaccine.  Okay to take acetaminophen.  Can also take NSAIDs like over-the-counter ibuprofen or naproxen of your symptoms, but use these sparingly and do not take them 24 hours before his planned angiogram next week. 2. PAD: With lifestyle limiting right lower extremity claudication.  For angiogram next week. 3. DM: Controlled, most recent available A1c 6.1%.  Weight loss would be highly beneficial 4. HLP: Has an excellent LDL cholesterol on statin plus ezetimibe, but his HDL is quite low.  After he has his lower extremity revascularization performed would benefit from a sustained program of exercise and weight loss. 5. HTN: well controlled 6. CAD: Currently asymptomatic        Medication Adjustments/Labs and Tests Ordered: Current medicines are reviewed at length with the patient today.  Concerns regarding medicines are outlined above.  Orders Placed This Encounter  Procedures  . EKG 12-Lead   No orders of the defined types were placed in this encounter.   Patient Instructions  Medication Instructions:  No changes *If you need a refill on your cardiac medications before your next appointment, please call your pharmacy*   Lab Work: None ordered If you have labs (blood work) drawn today and your tests are completely normal, you will receive your results only by: [name] MyChart Message (if you have MyChart) OR . A paper copy in the mail If you have any lab test that is abnormal or we need to change your treatment, we will call you to review the results.   Testing/Procedures: None ordered   Follow-Up: At [facility], you and your health needs are our priority.  As part of our continuing mission to provide you with exceptional heart care, we have created designated Provider Care Teams.  These Care Teams include your primary Cardiologist (physician) and Advanced Practice Providers (APPs -  Physician Assistants and Nurse Practitioners) who all  work together to provide you with the care you need, when you need it.  We recommend signing up for the patient portal called "MyChart".  Sign up information is provided on this After Visit Summary.  MyChart is used to connect with patients for Virtual Visits (Telemedicine).  Patients are able to view lab/test results, encounter notes, upcoming appointments, etc.  Non-urgent messages can be sent to your provider as well.   To learn more about what you can do with MyChart, go to [email or web address].    Your next appointment:   Follow up as planned with Dr. Fletcher Anon     Signed, Sanda Klein, MD  02/12/2021 9:10 AM    [place]

## 2021-02-12 NOTE — Patient Instructions (Signed)
Medication Instructions:  No changes *If you need a refill on your cardiac medications before your next appointment, please call your pharmacy*   Lab Work: None ordered If you have labs (blood work) drawn today and your tests are completely normal, you will receive your results only by: [name] MyChart Message (if you have MyChart) OR . A paper copy in the mail If you have any lab test that is abnormal or we need to change your treatment, we will call you to review the results.   Testing/Procedures: None ordered   Follow-Up: At [facility], you and your health needs are our priority.  As part of our continuing mission to provide you with exceptional heart care, we have created designated Provider Care Teams.  These Care Teams include your primary Cardiologist (physician) and Advanced Practice Providers (APPs -  Physician Assistants and Nurse Practitioners) who all work together to provide you with the care you need, when you need it.  We recommend signing up for the patient portal called "MyChart".  Sign up information is provided on this After Visit Summary.  MyChart is used to connect with patients for Virtual Visits (Telemedicine).  Patients are able to view lab/test results, encounter notes, upcoming appointments, etc.  Non-urgent messages can be sent to your provider as well.   To learn more about what you can do with MyChart, go to [email or web address].    Your next appointment:   Follow up as planned with Dr. Fletcher Anon

## 2021-02-17 ENCOUNTER — Other Ambulatory Visit (HOSPITAL_COMMUNITY): Payer: Medicare Other

## 2021-02-18 ENCOUNTER — Telehealth: Payer: Self-pay | Admitting: *Deleted

## 2021-02-18 ENCOUNTER — Other Ambulatory Visit (HOSPITAL_COMMUNITY)
Admission: RE | Admit: 2021-02-18 | Discharge: 2021-02-18 | Disposition: A | Discharge status: Home / Self Care | Payer: Medicare Other | Source: Ambulatory Visit | Attending: Cardiovascular Disease | Admitting: Cardiovascular Disease

## 2021-02-18 DIAGNOSIS — Z20822 Contact with and (suspected) exposure to covid-19: Secondary | ICD-10-CM | POA: Diagnosis not present

## 2021-02-18 DIAGNOSIS — Z01812 Encounter for preprocedural laboratory examination: Secondary | ICD-10-CM | POA: Diagnosis present

## 2021-02-18 LAB — SARS CORONAVIRUS 2 (TAT 6-24 HRS): SARS Coronavirus 2: NEGATIVE

## 2021-02-18 NOTE — Telephone Encounter (Addendum)
Pt contacted pre-abdominal aortogram scheduled at [facility] for: Wednesday February 19, 2021 8:30 AM Verified arrival time and place: [place] [facility]) at: 6:30 AM   No solid food after midnight prior to cath, clear liquids until 5 AM day of procedure.  Hold: Metformin-day of procedure and 48 hours post procedure   Except hold medications AM meds can be  taken pre-cath with sips of water including: ASA 81 mg   Confirmed patient has responsible adult to drive home post procedure and be with patient first 24 hours after arriving home: yes  You are allowed ONE visitor in the waiting room during the time you are at the hospital for your procedure. Both you and your visitor must wear a mask once you enter the hospital.   Reviewed procedure/mask/visitor instructions with patient.

## 2021-02-19 ENCOUNTER — Other Ambulatory Visit: Payer: Self-pay

## 2021-02-19 ENCOUNTER — Ambulatory Visit (HOSPITAL_COMMUNITY)
Admission: RE | Disposition: A | Discharge status: Home / Self Care | Payer: Self-pay | Source: Home / Self Care | Attending: Cardiovascular Disease

## 2021-02-19 ENCOUNTER — Ambulatory Visit (HOSPITAL_COMMUNITY)
Admission: RE | Admit: 2021-02-19 | Discharge: 2021-02-19 | Disposition: A | Discharge status: Home / Self Care | Payer: Medicare Other | Attending: Cardiovascular Disease | Admitting: Cardiovascular Disease

## 2021-02-19 DIAGNOSIS — E1151 Type 2 diabetes mellitus with diabetic peripheral angiopathy without gangrene: Secondary | ICD-10-CM | POA: Insufficient documentation

## 2021-02-19 DIAGNOSIS — I739 Peripheral vascular disease, unspecified: Secondary | ICD-10-CM

## 2021-02-19 DIAGNOSIS — I70213 Atherosclerosis of native arteries of extremities with intermittent claudication, bilateral legs: Secondary | ICD-10-CM | POA: Diagnosis not present

## 2021-02-19 HISTORY — PX: ABDOMINAL AORTOGRAM W/LOWER EXTREMITY: CATH118223

## 2021-02-19 LAB — GLUCOSE, CAPILLARY
Glucose-Capillary: 143 mg/dL — ABNORMAL HIGH (ref 70–99)
Glucose-Capillary: 116 mg/dL — ABNORMAL HIGH (ref 70–99)

## 2021-02-19 SURGERY — ABDOMINAL AORTOGRAM W/LOWER EXTREMITY
Anesthesia: LOCAL

## 2021-02-19 MED ORDER — MIDAZOLAM HCL 2 MG/2ML IJ SOLN
INTRAMUSCULAR | Status: AC
  Filled 2021-02-19: qty 2

## 2021-02-19 MED ORDER — SODIUM CHLORIDE 0.9 % IV SOLN: INTRAVENOUS | Status: DC

## 2021-02-19 MED ORDER — SODIUM CHLORIDE 0.9% FLUSH: 3.0000 mL | Freq: Two times a day (BID) | INTRAVENOUS | Status: DC

## 2021-02-19 MED ORDER — FENTANYL CITRATE (PF) 100 MCG/2ML IJ SOLN
INTRAMUSCULAR | Status: DC | PRN
  Administered 2021-02-19: 50 ug via INTRAVENOUS

## 2021-02-19 MED ORDER — SODIUM CHLORIDE 0.9 % IV SOLN: 250.0000 mL | INTRAVENOUS | Status: DC | PRN

## 2021-02-19 MED ORDER — ACETAMINOPHEN 325 MG PO TABS: 650.0000 mg | ORAL_TABLET | ORAL | Status: DC | PRN

## 2021-02-19 MED ORDER — HEPARIN (PORCINE) IN NACL 1000-0.9 UT/500ML-% IV SOLN
INTRAVENOUS | Status: DC | PRN
  Administered 2021-02-19 (×2): 500 mL

## 2021-02-19 MED ORDER — SODIUM CHLORIDE 0.9% FLUSH: 3.0000 mL | INTRAVENOUS | Status: DC | PRN

## 2021-02-19 MED ORDER — FENTANYL CITRATE (PF) 100 MCG/2ML IJ SOLN
INTRAMUSCULAR | Status: AC
  Filled 2021-02-19: qty 2

## 2021-02-19 MED ORDER — ASPIRIN 81 MG PO CHEW: 81.0000 mg | CHEWABLE_TABLET | ORAL | Status: DC

## 2021-02-19 MED ORDER — MIDAZOLAM HCL 2 MG/2ML IJ SOLN
INTRAMUSCULAR | Status: DC | PRN
  Administered 2021-02-19: 1 mg via INTRAVENOUS

## 2021-02-19 MED ORDER — ONDANSETRON HCL 4 MG/2ML IJ SOLN: 4.0000 mg | Freq: Four times a day (QID) | INTRAMUSCULAR | Status: DC | PRN

## 2021-02-19 MED ORDER — LIDOCAINE HCL (PF) 1 % IJ SOLN
INTRAMUSCULAR | Status: AC
  Filled 2021-02-19: qty 30

## 2021-02-19 MED ORDER — IODIXANOL 320 MG/ML IV SOLN
INTRAVENOUS | Status: DC | PRN
  Administered 2021-02-19: 77 mL

## 2021-02-19 MED ORDER — HEPARIN (PORCINE) IN NACL 1000-0.9 UT/500ML-% IV SOLN
INTRAVENOUS | Status: AC
  Filled 2021-02-19: qty 1000

## 2021-02-19 SURGICAL SUPPLY — 13 items
CATH ANGIO 5F PIGTAIL 65CM (CATHETERS) ×1 IMPLANT
CATH CROSS OVER TEMPO 5F (CATHETERS) ×1 IMPLANT
CATH STRAIGHT 5FR 65CM (CATHETERS) ×2 IMPLANT
DEVICE CLOSURE MYNXGRIP 5F (Vascular Products) ×2 IMPLANT
KIT ANGIASSIST CO2 SYSTEM (KITS) ×2 IMPLANT
KIT MICROPUNCTURE NIT STIFF (SHEATH) ×2 IMPLANT
KIT PV (KITS) ×2 IMPLANT
SHEATH PINNACLE 5F 10CM (SHEATH) ×2 IMPLANT
SHEATH PROBE COVER 6X72 (BAG) ×1 IMPLANT
SYR MEDRAD MARK 7 150ML (SYRINGE) ×2 IMPLANT
TRANSDUCER W/STOPCOCK (MISCELLANEOUS) ×2 IMPLANT
TRAY PV CATH (CUSTOM PROCEDURE TRAY) ×2 IMPLANT
WIRE BENTSON .035X145CM (WIRE) ×2 IMPLANT

## 2021-02-19 NOTE — Interval H&P Note (Signed)
History and Physical Interval Note:  02/19/2021 10:03 AM  Steven Hale.  has presented today for surgery, with the diagnosis of PAD.  The various methods of treatment have been discussed with the patient and family. After consideration of risks, benefits and other options for treatment, the patient has consented to  Procedure(s): ABDOMINAL AORTOGRAM W/LOWER EXTREMITY (N/A) as a surgical intervention.  The patient's history has been reviewed, patient examined, no change in status, stable for surgery.  I have reviewed the patient's chart and labs.  Questions were answered to the patient's satisfaction.     Kathlyn [place]

## 2021-02-19 NOTE — Discharge Instructions (Signed)
Peripheral Vascular Disease  Peripheral vascular disease (PVD) is a disease of the blood vessels that carry blood from the heart to the rest of the body. PVD is also called peripheral artery disease (PAD) or poor circulation. PVD affects most of the body. But it affects the legs and feet the most. PVD can lead to acute limb ischemia. This happens when there is a sudden stop of blood flow to an arm or leg. This is a medical emergency. What are the causes? The most common cause of PVD is a buildup of a fatty substance (plaque) inside your arteries. This decreases blood flow. Plaque can break off and block blood in a smaller artery. This can lead to acute limb ischemia. Other common causes of PVD include:  Blood clots inside the blood vessels.  Injuries to blood vessels.  Irritation and swelling of blood vessels.  Sudden tightening of the blood vessel (spasms). What increases the risk?  A family history of PVD.  Medical conditions, including: ? High cholesterol. ? Diabetes. ? High blood pressure. ? Heart disease. ? Past problems with blood clots. ? Past injury, such as burns or a broken bone.  Other conditions, such as: ? Buerger's disease. This is caused by swollen or irritated blood vessels in your hands and feet. ? Arthritis. ? Birth defects that affect the arteries in your legs. ? Kidney disease.  Using tobacco or nicotine products.  Not getting enough exercise.  Being very overweight (obese).  Being 27 years old or older. What are the signs or symptoms?  Cramps in your butt, legs, and feet.  Pain and weakness in your legs when you are active that goes away when you rest.  Leg pain when at rest.  Leg numbness, tingling, or weakness.  Coldness in a leg or foot, especially when compared with the other leg or foot.  Skin or hair changes. These can include: ? Hair loss. ? Shiny skin. ? Pale or bluish skin. ? Thick toenails.  Being unable to get or keep an  erection.  Tiredness (fatigue).  Weak pulse or no pulse in the feet.  Wounds and sores on the toes, feet, or legs. These take longer to heal. How is this treated? Underlying causes are treated first. Other conditions, like diabetes, high cholesterol, and blood pressure, are also treated. Treatment may include:  Lifestyle changes, such as: ? Quitting smoking. ? Getting regular exercise. ? Having a diet low in fat and cholesterol. ? Not drinking alcohol.  Taking medicines, such as: ? Blood thinners. ? Medicines to improve blood flow. ? Medicines to improve your blood cholesterol.  Procedures to: ? Open the arteries and restore blood flow. ? Insert a small mesh tube (stent) to keep a blocked vessel open. ? Create a new path for blood to flow to the body (peripheral bypass). ? Remove dead tissue from a wound. ? Remove an affected leg or arm. Follow these instructions at home: Medicines  Take over-the-counter and prescription medicines only as told by your doctor.  If you are taking blood thinners: ? Talk with your doctor before you take any medicines that have aspirin, or NSAIDs, such as ibuprofen. ? Take medicines exactly as told. Take them at the same time each day. ? Avoid doing things that could hurt or bruise you. Take action to prevent falls. ? Wear an alert bracelet or carry a card that shows you are taking blood thinners. Lifestyle  Get regular exercise. Ask your doctor about how to stay active.  Talk with your doctor about keeping a healthy weight. If needed, ask about losing weight.  Eat a diet that is low in fat and cholesterol. If you need help, talk with your doctor.  Do not drink alcohol.  Do not smoke or use any products that contain nicotine or tobacco. If you need help quitting, ask your doctor.      General instructions  Take good care of your feet. To do this: ? Wear shoes that fit well and feel good. ? Check your feet often for any cuts or  sores.  Get a flu shot (influenza vaccine) each year.  Keep all follow-up visits. Where to find more information  Society for Vascular Surgery: vascular.org  American Heart Association: heart.org  National Heart, Lung, and Blood Institute: https://www.[place].biz/ Contact a doctor if:  You have cramps in your legs when you walk.  You have leg pain when you rest.  Your leg or foot feels cold.  Your skin changes.  You cannot get or keep an erection.  You have cuts or sores on your legs or feet that do not heal. Get help right away if:  You have sudden changes in the color and feeling of your arms or legs, such as: ? Your arm or leg turns cold, numb, and blue. ? Your arm or leg becomes red, warm, swollen, painful, or numb.  You have any signs of a stroke. "BE FAST" is an easy way to remember the main warning signs: ? B - Balance. Dizziness, sudden trouble walking, or loss of balance. ? E - Eyes. Trouble seeing or a change in how you see. ? F - Face. Sudden weakness or loss of feeling of the face. The face or eyelid may droop on one side. ? A - Arms. Weakness or loss of feeling in an arm. This happens all of a sudden and most often on one side of the body. ? S - Speech. Sudden trouble speaking, slurred speech, or trouble understanding what people say. ? T - Time. Time to call emergency services. Write down what time symptoms started.  You have other signs of a stroke, such as: ? A sudden, very bad headache with no known cause. ? Feeling like you may vomit (nausea). ? Vomiting. ? A seizure.  You have chest pain or trouble breathing. These symptoms may be an emergency. Get help right away. Call your local emergency services (911 in the [place]  Do not wait to see if the symptoms will go away.  Do not drive yourself to the hospital. Summary  Peripheral vascular disease (PVD) is a disease of the blood vessels.  PVD affects the legs and feet the most.  Symptoms may include leg  pain or leg numbness, tingling, and weakness.  Treatment may include lifestyle changes, medicines, and procedures. This information is not intended to replace advice given to you by your health care provider. Make sure you discuss any questions you have with your health care provider. Document Revised: 05/06/2020 Document Reviewed: 05/06/2020 Elsevier Patient Education  [place].   Preventing Peripheral Vascular Disease  Peripheral vascular disease (PVD) is a condition of the blood vessels caused by the blocking or hardening of the arteries anywhere within the circulatory system beyond the heart. This condition is also called peripheral artery disease (PAD) or poor circulation. Damage happens when fat, cholesterol, and other substances (plaque) build up in the blood and narrow the arteries that carry blood to other areas of the body. This damage  is called atherosclerosis. The blood vessels that carry blood from the heart to the legs are most commonly affected. Other commonly affected blood vessels are those in the arms, kidneys, and stomach. How can peripheral vascular disease affect me? PVD affects the way blood flows to areas of your body. It can cause:  Intermittent claudication. This is pain, cramps, and weakness in the buttocks, legs, and feet during activity. This resolves with rest.  Pain, numbness, tingling, or weakness in areas of poor blood flow.  Skin changes, hair loss, and thickened toenails.  Open wounds (ulcers) and sores on toes, feet, or legs. The ulcers or sores may take longer than normal to heal.  Tissue death in areas with poor blood flow (gangrene). Surgery to remove these areas of dead tissue may be necessary. This may lead to removal of a leg (amputation). PVD also increases your risk of serious health problems, including:  Stroke.  A "warning stroke" that causes stroke-like symptoms (transient ischemic attack or TIA).  Heart disease.  Heart  attack. What actions can I take to prevent peripheral vascular disease? Your daily lifestyle habits can affect your risk of PVD. To lower your risk:  Do not use any products that contain nicotine or tobacco. These products include cigarettes, chewing tobacco, and vaping devices, such as e-cigarettes. If you need help quitting, ask your health care provider.  Avoid drinking alcohol.  Maintain a healthy body weight.  Get regular exercise as directed by your health care provider.  Eat a heart-healthy diet. Choose low-fat, low-sodium foods, and eat plenty of whole grains, fruits, and vegetables. It is also important to manage any long-term (chronic) health conditions that can increase your risk of PVD. These include:  Diabetes.  Heart disease.  High blood pressure.  High cholesterol. What can happen if changes are not made? If you do not take steps to prevent PVD, you may develop PVD or serious complications of PVD. This can result in:  Intermittent claudication.  Night pain or pain at rest.  Acute limb ischemia. This is loss of blood flow to a leg. This causes severe pain and numbness.  Delayed or poor wound healing. This can lead to infections. If these are not treated properly, they can lead to an amputation.  Stroke, heart disease, or heart attack. Where to find more information Learn more about PVD from:  Society for Vascular Surgery: vascular.org  American Heart Association: www.heart.org  National Heart, Lung, and Blood Institute: https://[place].com/ Contact a health care provider if you have:  New pain in your legs, arms, or other areas of your body.  Signs of infection, such as redness or swelling.  A sore on your leg or foot that is not healing. Get help right away if you have:  Chest pain.  Trouble talking or moving one side of your body. These symptoms may represent a serious problem that is an emergency. Do not wait to see if the symptoms will go away. Get  medical help right away. Call your local emergency services (911 in the [place] Do not drive yourself to the hospital. Summary  PVD is a long-term (chronic) condition that can affect blood vessels throughout the body.  PVD can increase your risk of heart disease, heart attack, and stroke.  You can take steps to prevent PVD by making healthy lifestyle choices, like quitting smoking, eating a heart-healthy diet, and exercising regularly.  Work with your health care provider to manage chronic medical conditions that increase your risk of developing  PVD.  Get help right away if you have chest pain or trouble moving one side of your body. This information is not intended to replace advice given to you by your health care provider. Make sure you discuss any questions you have with your health care provider. Document Revised: 05/06/2020 Document Reviewed: 05/06/2020 Elsevier Patient Education  2021 [place] [employer] This sheet gives you information about how to care for yourself after your procedure. Your health care provider may also give you more specific instructions. If you have problems or questions, contact your health care provider. What can I expect after the procedure? After the procedure, it is common to have:  Bruising and tenderness at the catheter insertion area.  A collection of blood (hematoma) at the insertion area. This may feel like a small lump under the skin at the insertion site. Follow these instructions at home: Insertion site care  Follow instructions from your health care provider about how to take care of your insertion site. Make sure you: ? Wash your hands with soap and water before and after you change your bandage (dressing). If soap and water are not available, use hand sanitizer. ? Change your dressing as told by your health care provider.  Do not take baths, swim, or use a hot tub until your health care provider approves.  You may shower  24-48 hours after the procedure, or as told by your health care provider. To clean the insertion site: ? Gently wash the area with plain soap and water. ? Pat the area dry with a clean towel. ? Do not rub the site. This may cause bleeding.  Check your insertion site every day for signs of infection. Check for: ? Redness, swelling, or pain. ? Fluid or blood. ? Warmth. ? Pus or a bad smell.  Do not apply powder or lotion to the site. Keep the site clean and dry.   Activity  Do not drive for 24 hours if you were given a sedative during your procedure.  Rest as told by your health care provider, usually for 1-2 days.  Do not lift anything that is heavier than 10 lb (4.5 kg), or the limit that you are told, until your health care provider says that it is safe.  If the insertion site was in your leg, try to avoid stairs for a few days.  Return to your normal activities as told by your health care provider, usually in about a week. Ask your health care provider what activities are safe for you. General instructions  If your insertion site starts bleeding, lie flat and put pressure on the site. If the bleeding does not stop, get help right away. This is a medical emergency.  Take over-the-counter and prescription medicines only as told by your health care provider.  Drink enough fluid to keep your urine pale yellow. This helps flush the contrast dye from your body.  Keep all follow-up visits as told by your health care provider. This is important.   Contact a health care provider if:  You have a fever or chills.  You have redness, swelling, or pain around your insertion site.  You have fluid or blood coming from your insertion site.  Your insertion site feels warm to the touch.  You have pus or a bad smell coming from your insertion site.  You have more bruising around the insertion site. Get help right away if you have:  A problem with the insertion area, such  as: ? The area  swells fast or bleeds even after you apply pressure. ? The area becomes pale, cool, tingly, or numb.  Chest pain.  Trouble breathing.  A rash.  Any symptoms of a stroke. "BE FAST" is an easy way to remember the main warning signs of a stroke: ? B - Balance. Signs are dizziness, sudden trouble walking, or loss of balance. ? E - Eyes. Signs are trouble seeing or a sudden change in vision. ? F - Face. Signs are sudden weakness or loss of feeling of the face, or the face or eyelid drooping on one side. ? A - Arms. Signs are weakness or loss of feeling in an arm. This happens suddenly and usually on one side of the body. ? S - Speech. Signs are sudden trouble speaking, slurred speech, or trouble understanding what people say. ? T - Time. Time to call emergency services. Write down what time symptoms started.  You have other signs of a stroke, such as: ? A sudden, severe headache with no known cause. ? Nausea or vomiting. ? Seizure. These symptoms may represent a serious problem that is an emergency. Do not wait to see if the symptoms will go away. Get medical help right away. Call your local emergency services (911 in the [place] Do not drive yourself to the hospital. Summary  It is common to have bruising and tenderness at the catheter insertion area.  Do not take baths, swim, or use a hot tub until your health care provider approves. You may shower 24-48 hours after the procedure or as told.  It is important to rest and drink plenty of fluids.  If the insertion site bleeds, lie flat and put pressure on the site. If the bleeding continues, get help right away. This is a medical emergency. This information is not intended to replace advice given to you by your health care provider. Make sure you discuss any questions you have with your health care provider. Document Revised: 09/06/2019 Document Reviewed: 09/06/2019 Elsevier Patient Education  [place].

## 2021-02-19 NOTE — Progress Notes (Signed)
Discharge instruction given to pt  And was able to verbalize understanding  Pt waiting for ride.

## 2021-02-20 ENCOUNTER — Other Ambulatory Visit (HOSPITAL_COMMUNITY): Payer: Self-pay

## 2021-02-20 ENCOUNTER — Encounter (HOSPITAL_COMMUNITY): Payer: Self-pay | Admitting: Cardiovascular Disease

## 2021-02-20 MED FILL — Lidocaine HCl Local Preservative Free (PF) Inj 1%: INTRAMUSCULAR | Qty: 30 | Status: AC

## 2021-02-24 ENCOUNTER — Encounter (HOSPITAL_COMMUNITY): Payer: Self-pay | Admitting: Cardiovascular Disease

## 2021-02-24 NOTE — H&P (View-Only) (Signed)
[facility] [place]  ASSESSMENT / PLAN: Steven Hale. is a 65 y.o. male with atherosclerosis of native arteries of right lower extremity causing disabling claudication.  Recommend the following which can slow the progression of atherosclerosis and reduce the risk of major adverse cardiac / limb events:  Complete cessation from all tobacco products. Blood glucose control with goal A1c < 7%. Blood pressure control with goal blood pressure < 140/90 mmHg. Lipid reduction therapy with goal LDL-C <100 mg/dL (<70 if symptomatic from PAD).  Aspirin 81mg  PO QD.  Atorvastatin 40-80mg  PO QD (or other "high intensity" statin therapy).  Plan right femoral endarterectomy 03/03/2021.    CHIEF COMPLAINT: Calf pain.  HISTORY OF PRESENT ILLNESS: Steven Hale. is a 65 y.o. male referred to clinic for evaluation after angiogram demonstrating severe right common femoral artery atherosclerotic plaque.  The patient reports bilateral calf claudication which has become disabling.  He is a remote smoker, having quit in 2009.  He is compliant with best medical therapy for peripheral arterial disease.  Complicating things is a history of chronic back pain with neurogenic claudication.  I counseled the patient extensively about the nature of this plaque, and that treating it with endarterectomy will only provide symptom relief on the right side.  Past Medical History:  Diagnosis Date  . Anemia, unspecified   . Bell's palsy   . Blood transfusion    during treatment for Ca  . Blood transfusion without reported diagnosis   . CAD (coronary artery disease)    a. s/p multiple PCIs;  b. s/p CABG in 09/2010 (LIMA-LAD, SVG-OM1, SVG-distal RCA/OM2);  Cardiac cath in 12/2015: Mild LAD disease, occluded mid LCX and proximal RCA (left to right collaterals), Occluded SVG to RCA and atretic LIMA. Patent SVG to OM  . Chronic back pain   . Diabetes mellitus without complication ([place])   . DJD  (degenerative joint disease)    low back & all over   . GERD (gastroesophageal reflux disease)   . Helicobacter pylori (H. pylori) infection   . Hemorrhoids   . Hyperlipidemia   . Hypertension   . Impotence of organic origin   . Myocardial infarction ([place])    2005 and 2012   . PAD (peripheral artery disease) ([place])    a. s/p bilat SFA stents;  b. ABIs 11/2012: R 0.99, L 0.86.  [name] Pancreatic cancer [facility])    surgery 2009  . Pituitary adenoma [facility])    surgery at [facility] Feb 2020  . Pre-diabetes 08/16/2015  . Sleep apnea 06/05/2019    Past Surgical History:  Procedure Laterality Date  . ABDOMINAL AORTAGRAM N/A 09/13/2013   Procedure: ABDOMINAL Maxcine Ham;  Surgeon: Wellington Hampshire, MD;  Location: [facility] CATH LAB;  Service: Cardiovascular;  Laterality: N/A;  . ABDOMINAL AORTAGRAM N/A 08/29/2014   Procedure: ABDOMINAL Maxcine Ham;  Surgeon: Wellington Hampshire, MD;  Location: [facility] CATH LAB;  Service: Cardiovascular;  Laterality: N/A;  . ABDOMINAL AORTOGRAM W/LOWER EXTREMITY N/A 02/19/2021   Procedure: ABDOMINAL AORTOGRAM W/LOWER EXTREMITY;  Surgeon: Wellington Hampshire, MD;  Location: [place] CV LAB;  Service: Cardiovascular;  Laterality: N/A;  . BACK SURGERY     1992  . CARDIAC CATHETERIZATION N/A 12/25/2015   Procedure: Left Heart Cath and Cors/Grafts Angiography;  Surgeon: Wellington Hampshire, MD;  Location: [facility] CV LAB;  Service: Cardiovascular;  Laterality: N/A;  . CORONARY ARTERY BYPASS GRAFT  nov 2011   x 4  . ESOPHAGOGASTRODUODENOSCOPY (EGD) WITH PROPOFOL N/A  04/02/2017   Procedure: ESOPHAGOGASTRODUODENOSCOPY (EGD) WITH PROPOFOL;  Surgeon: Carol Ada, MD;  Location: [facility] END[facility];  Service: Endoscopy;  Laterality: N/A;  . HEMORRHOID SURGERY  10/30/2011   Procedure: HEMORRHOIDECTOMY;  Surgeon: Harl Bowie, MD;  Location: [facility];  Service: General;  Laterality: N/A;  . JOINT REPLACEMENT    . pancreatic  cancer  05/10/2008   Resection of distal panrease and spleen  . PARTIAL KNEE  ARTHROPLASTY Right 11/19/2014   Procedure: RIGHT UNI-KNEE ARTHROPLASTY MEDIALLY;  Surgeon: Mauri Pole, MD;  Location: [facility] OR[facility];  Service: Orthopedics;  Laterality: Right;  . PERIPHERAL VASCULAR CATHETERIZATION  Oct 2014   Lt SFA PTA  . PERIPHERAL VASCULAR CATHETERIZATION  Oct 2015   ISR Lt SFA-PTA  . right partial knee replacement    . splenectomy    . THROMBECTOMY FEMORAL ARTERY Left 09/13/2013   Procedure: THROMBECTOMY FEMORAL ARTERY;  Surgeon: Wellington Hampshire, MD;  Location: [facility] CATH LAB;  Service: Cardiovascular;  Laterality: Left;  . TRANSPHENOIDAL / TRANSNASAL HYPOPHYSECTOMY / RESECTION PITUITARY TUMOR  12/2018   [facility]    Family History  Problem Relation Age of Onset  . Heart attack Father        died of MI at age 64  . Cancer Father   . Anesthesia problems Neg Hx   . Hypotension Neg Hx   . Malignant hyperthermia Neg Hx   . Pseudochol deficiency Neg Hx   . Colon cancer Neg Hx   . Esophageal cancer Neg Hx   . Prostate cancer Neg Hx   . Rectal cancer Neg Hx   . Stomach cancer Neg Hx     Social History   Socioeconomic History  . Marital status: Single    Spouse name: Not on file  . Number of children: 0  . Years of education: Not on file  . Highest education level: Not on file  Occupational History  . Occupation: TEFL teacher: UNEMPLOYED  Tobacco Use  . Smoking status: Former Smoker    Packs/day: 1.00    Years: 18.00    Pack years: 18.00    Types: Cigarettes    Quit date: 11/29/2008    Years since quitting: 12.2  . Smokeless tobacco: Never Used  Substance and Sexual Activity  . Alcohol use: Yes    Alcohol/week: 3.0 standard drinks    Types: 3 Shots of liquor per week    Comment: 2 drinks of brandy every week   . Drug use: No  . Sexual activity: Not Currently  Other Topics Concern  . Not on file  Social History Narrative   He works at the night shift at [employer] part time   [employer] [phone]   Single, no children   Social Determinants of  Radio broadcast assistant Strain: Not on file  Food Insecurity: Not on file  Transportation Needs: Not on file  Physical Activity: Not on file  Stress: Not on file  Social Connections: Not on file  Intimate Partner Violence: Not on file    No Known Allergies  Current Outpatient Medications  Medication Sig Dispense Refill  . amoxicillin (AMOXIL) 500 MG capsule TAKE 4 CAPSULES BY MOUTH 1 HOUR PRIOR TO DENTAL APPOINTMENT 20 capsule 1  . aspirin EC 81 MG tablet Take 1 tablet (81 mg total) by mouth daily.    [name] atorvastatin (LIPITOR) 40 MG tablet Take 1 tablet (40 mg total) by mouth daily. (Patient taking differently: Take 40 mg by mouth  at bedtime.) 90 tablet 3  . DULoxetine HCl 30 MG CSDR Take 30 mg by mouth 2 (two) times daily.    [name] ezetimibe (ZETIA) 10 MG tablet TAKE 1 TABLET BY MOUTH ONCE DAILY 90 tablet 3  . fish oil-omega-3 fatty acids 1000 MG capsule Take 1 capsule (1 g total) by mouth daily. 60 capsule 1  . levothyroxine (SYNTHROID) 25 MCG tablet Take 25 mcg by mouth daily.    [name] lisinopril (ZESTRIL) 2.5 MG tablet Take 2.5 mg by mouth daily.    . metFORMIN (GLUCOPHAGE) 500 MG tablet Take 1 tablet (500 mg total) by mouth daily with breakfast. (Patient taking differently: Take 500 mg by mouth 2 (two) times daily with a meal.) 90 tablet 3  . metoprolol tartrate (LOPRESSOR) 25 MG tablet Take 1 tablet by mouth two  times daily (Patient taking differently: Take 25 mg by mouth 2 (two) times daily.) 60 tablet 0  . Multiple Vitamin (MULTIVITAMIN) tablet Take 1 tablet by mouth daily. 30 tablet 10  . nitroGLYCERIN (NITROSTAT) 0.4 MG SL tablet Place 0.4 mg under the tongue every 2 (two) hours as needed for chest pain.    . pantoprazole (PROTONIX) 40 MG tablet Take 1 tablet (40 mg total) by mouth daily. 30 tablet 10  . pantoprazole (PROTONIX) 40 MG tablet TAKE 1 TABLET BY MOUTH ONCE DAILY 30 tablet 6  . polyethylene glycol (MIRALAX / GLYCOLAX) 17 g packet Take 17 g by mouth daily as needed for  mild constipation or moderate constipation.    . pregabalin (LYRICA) 150 MG capsule Take 1 capsule (150 mg total) by mouth daily. (Patient taking differently: Take 300 mg by mouth 2 (two) times daily.) 30 capsule 0  . Propylene Glycol (SYSTANE BALANCE) 0.6 % SOLN Apply 1 drop to eye 4 (four) times daily.    . sildenafil (VIAGRA) 100 MG tablet Take 0.5-1 tablets (50-100 mg total) by mouth daily as needed for erectile dysfunction. 5 tablet 11  . tamsulosin (FLOMAX) 0.4 MG CAPS capsule TAKE 1 CAPSULE BY MOUTH DAILY. (Patient taking differently: Take 0.4 mg by mouth at bedtime.) 30 capsule 2   No current facility-administered medications for this visit.    REVIEW OF SYSTEMS:  [X]  denotes positive finding, [ ]  denotes negative finding Cardiac  Comments:  Chest pain or chest pressure:    Shortness of breath upon exertion: x   Short of breath when lying flat:    Irregular heart rhythm:        Vascular    Pain in calf, thigh, or hip brought on by ambulation: x   Pain in feet at night that wakes you up from your sleep:  x   Blood clot in your veins:    Leg swelling:         Pulmonary    Oxygen at home:    Productive cough:     Wheezing:         Neurologic    Sudden weakness in arms or legs:  x   Sudden numbness in arms or legs:  x   Sudden onset of difficulty speaking or slurred speech: x   Temporary loss of vision in one eye:  x   Problems with dizziness:  x       Gastrointestinal    Blood in stool:     Vomited blood:         Genitourinary    Burning when urinating:     Blood in urine:  Psychiatric    Major depression:         Hematologic    Bleeding problems:    Problems with blood clotting too easily:        Skin    Rashes or ulcers:        Constitutional    Fever or chills:      PHYSICAL EXAM  Vitals:   02/25/21 1050  BP: 101/61  Pulse: 82  Resp: 20  Temp: 97.9 F (36.6 C)  SpO2: 98%  Weight: 232 lb (105.2 kg)  Height: 5\' 9"  (1.753 m)     Constitutional: well appearing. no distress. Appears well nourished.  Neurologic: CN intact. no focal findings. no sensory loss. Psychiatric: Mood and affect symmetric and appropriate. Eyes: No icterus. No conjunctival pallor. Ears, nose, throat: mucous membranes moist. Midline trachea.  Cardiac: regular rate and rhythm.  Respiratory: unlabored. Abdominal: soft, non-tender, non-distended.  Peripheral vascular:  Absent R femoral pulse  2+ L femoral pulse Extremity: No edema. No cyanosis. No pallor.  Skin: No gangrene. No ulceration.  Lymphatic: No Stemmer's sign. No palpable lymphadenopathy.  PERTINENT LABORATORY AND RADIOLOGIC DATA  Most recent CBC CBC Latest Ref Rng & Units 02/04/2021 03/21/2019 12/25/2018  WBC 3.4 - 10.8 x10E3/uL 4.7 4.6 5.3  Hemoglobin 13.0 - 17.7 g/dL 12.9(L) 12.3(L) 11.6(L)  Hematocrit 37.5 - 51.0 % 38.8 36.2(L) 36.2(L)  Platelets 150 - 450 x10E3/uL 284 346 279     Most recent CMP CMP Latest Ref Rng & Units 02/04/2021 08/09/2020 01/08/2020  Glucose 65 - 99 mg/dL 118(H) - 120(H)  BUN 8 - 27 mg/dL 11 - 13  Creatinine 0.76 - 1.27 mg/dL 0.86 - 0.97  Sodium 134 - 144 mmol/L 142 - 143  Potassium 3.5 - 5.2 mmol/L 4.5 - 5.0  Chloride 96 - 106 mmol/L 102 - 105  CO2 20 - 29 mmol/L 22 - 25  Calcium 8.6 - 10.2 mg/dL 10.3(H) - 9.8  Total Protein 6.0 - 8.5 g/dL - 7.2 7.0  Total Bilirubin 0.0 - 1.2 mg/dL - 0.3 0.3  Alkaline Phos 44 - 121 IU/L - 102 81  AST 0 - 40 IU/L - 25 30  ALT 0 - 44 IU/L - 18 32    Renal function Estimated Creatinine Clearance: 104 mL/min (by C-G formula based on SCr of 0.86 mg/dL).  Hgb A1c MFr Bld (%)  Date Value  12/08/2019 6.9 (H)    LDL Chol Calc (NIH)  Date Value Ref Range Status  08/09/2020 49 0 - 99 mg/dL Final   Direct LDL  Date Value Ref Range Status  11/06/2013 80 mg/dL Final    Comment:    ATP III Classification (LDL):       < 100        mg/dL         Optimal      100 - 129     mg/dL         Near or Above Optimal       130 - 159     mg/dL         Borderline High      160 - 189     mg/dL         High       > 190        mg/dL         Very High       Dr. Tyrell Antonio angiogram personally reviewed. Severe, exophytic flow-limiting plaque in the right  common femoral artery.  Yevonne Aline. Stanford Breed, MD [facility] Phone Number: [phone] 02/24/2021 11:51 AM

## 2021-02-24 NOTE — Progress Notes (Signed)
[facility] [place]  ASSESSMENT / PLAN: Steven Sens. is a 65 y.o. male with atherosclerosis of native arteries of right lower extremity causing disabling claudication.  Recommend the following which can slow the progression of atherosclerosis and reduce the risk of major adverse cardiac / limb events:  Complete cessation from all tobacco products. Blood glucose control with goal A1c < 7%. Blood pressure control with goal blood pressure < 140/90 mmHg. Lipid reduction therapy with goal LDL-C <100 mg/dL (<70 if symptomatic from PAD).  Aspirin 81mg  PO QD.  Atorvastatin 40-80mg  PO QD (or other "high intensity" statin therapy).  Plan right femoral endarterectomy 03/03/2021.    CHIEF COMPLAINT: Calf pain.  HISTORY OF PRESENT ILLNESS: Skylor Hale. is a 65 y.o. male referred to clinic for evaluation after angiogram demonstrating severe right common femoral artery atherosclerotic plaque.  The patient reports bilateral calf claudication which has become disabling.  He is a remote smoker, having quit in 2009.  He is compliant with best medical therapy for peripheral arterial disease.  Complicating things is a history of chronic back pain with neurogenic claudication.  I counseled the patient extensively about the nature of this plaque, and that treating it with endarterectomy will only provide symptom relief on the right side.  Past Medical History:  Diagnosis Date  . Anemia, unspecified   . Bell's palsy   . Blood transfusion    during treatment for Ca  . Blood transfusion without reported diagnosis   . CAD (coronary artery disease)    a. s/p multiple PCIs;  b. s/p CABG in 09/2010 (LIMA-LAD, SVG-OM1, SVG-distal RCA/OM2);  Cardiac cath in 12/2015: Mild LAD disease, occluded mid LCX and proximal RCA (left to right collaterals), Occluded SVG to RCA and atretic LIMA. Patent SVG to OM  . Chronic back pain   . Diabetes mellitus without complication ([place])   . DJD  (degenerative joint disease)    low back & all over   . GERD (gastroesophageal reflux disease)   . Helicobacter pylori (H. pylori) infection   . Hemorrhoids   . Hyperlipidemia   . Hypertension   . Impotence of organic origin   . Myocardial infarction ([place])    2005 and 2012   . PAD (peripheral artery disease) ([place])    a. s/p bilat SFA stents;  b. ABIs 11/2012: R 0.99, L 0.86.  [name] Pancreatic cancer [facility])    surgery 2009  . Pituitary adenoma [facility])    surgery at [facility] Feb 2020  . Pre-diabetes 08/16/2015  . Sleep apnea 06/05/2019    Past Surgical History:  Procedure Laterality Date  . ABDOMINAL AORTAGRAM N/A 09/13/2013   Procedure: ABDOMINAL Maxcine Ham;  Surgeon: Wellington Hampshire, MD;  Location: [place] CATH LAB;  Service: Cardiovascular;  Laterality: N/A;  . ABDOMINAL AORTAGRAM N/A 08/29/2014   Procedure: ABDOMINAL Maxcine Ham;  Surgeon: Wellington Hampshire, MD;  Location: [place] CATH LAB;  Service: Cardiovascular;  Laterality: N/A;  . ABDOMINAL AORTOGRAM W/LOWER EXTREMITY N/A 02/19/2021   Procedure: ABDOMINAL AORTOGRAM W/LOWER EXTREMITY;  Surgeon: Wellington Hampshire, MD;  Location: [place] CV LAB;  Service: Cardiovascular;  Laterality: N/A;  . BACK SURGERY     1992  . CARDIAC CATHETERIZATION N/A 12/25/2015   Procedure: Left Heart Cath and Cors/Grafts Angiography;  Surgeon: Wellington Hampshire, MD;  Location: [place] CV LAB;  Service: Cardiovascular;  Laterality: N/A;  . CORONARY ARTERY BYPASS GRAFT  nov 2011   x 4  . ESOPHAGOGASTRODUODENOSCOPY (EGD) WITH PROPOFOL N/A  04/02/2017   Procedure: ESOPHAGOGASTRODUODENOSCOPY (EGD) WITH PROPOFOL;  Surgeon: Carol Ada, MD;  [facility]: [facility] ENDOS[facility];  Service: Endoscopy;  Laterality: N/A;  . HEMORRHOID SURGERY  10/30/2011   Procedure: HEMORRHOIDECTOMY;  Surgeon: Harl Bowie, MD;  Location: [place];  Service: General;  Laterality: N/A;  . JOINT REPLACEMENT    . pancreatic  cancer  05/10/2008   Resection of distal panrease and spleen  . PARTIAL KNEE  ARTHROPLASTY Right 11/19/2014   Procedure: RIGHT UNI-KNEE ARTHROPLASTY MEDIALLY;  Surgeon: Mauri Pole, MD;  Location: [facility] ORS;  Service: Orthopedics;  Laterality: Right;  . PERIPHERAL VASCULAR CATHETERIZATION  Oct 2014   Lt SFA PTA  . PERIPHERAL VASCULAR CATHETERIZATION  Oct 2015   ISR Lt SFA-PTA  . right partial knee replacement    . splenectomy    . THROMBECTOMY FEMORAL ARTERY Left 09/13/2013   Procedure: THROMBECTOMY FEMORAL ARTERY;  Surgeon: Wellington Hampshire, MD;  Location: [place] CATH LAB;  Service: Cardiovascular;  Laterality: Left;  . TRANSPHENOIDAL / TRANSNASAL HYPOPHYSECTOMY / RESECTION PITUITARY TUMOR  12/2018   [facility]    Family History  Problem Relation Age of Onset  . Heart attack Father        died of MI at age 23  . Cancer Father   . Anesthesia problems Neg Hx   . Hypotension Neg Hx   . Malignant hyperthermia Neg Hx   . Pseudochol deficiency Neg Hx   . Colon cancer Neg Hx   . Esophageal cancer Neg Hx   . Prostate cancer Neg Hx   . Rectal cancer Neg Hx   . Stomach cancer Neg Hx     Social History   Socioeconomic History  . Marital status: Single    Spouse name: Not on file  . Number of children: 0  . Years of education: Not on file  . Highest education level: Not on file  Occupational History  . Occupation: TEFL teacher: UNEMPLOYED  Tobacco Use  . Smoking status: Former Smoker    Packs/day: 1.00    Years: 18.00    Pack years: 18.00    Types: Cigarettes    Quit date: 11/29/2008    Years since quitting: 12.2  . Smokeless tobacco: Never Used  Substance and Sexual Activity  . Alcohol use: Yes    Alcohol/week: 3.0 standard drinks    Types: 3 Shots of liquor per week    Comment: 2 drinks of brandy every week   . Drug use: No  . Sexual activity: Not Currently  Other Topics Concern  . Not on file  Social History Narrative   He works at the night shift at [employer] part time   [employer] [phone]   Single, no children   Social Determinants of  Radio broadcast assistant Strain: Not on file  Food Insecurity: Not on file  Transportation Needs: Not on file  Physical Activity: Not on file  Stress: Not on file  Social Connections: Not on file  Intimate Partner Violence: Not on file    No Known Allergies  Current Outpatient Medications  Medication Sig Dispense Refill  . amoxicillin (AMOXIL) 500 MG capsule TAKE 4 CAPSULES BY MOUTH 1 HOUR PRIOR TO DENTAL APPOINTMENT 20 capsule 1  . aspirin EC 81 MG tablet Take 1 tablet (81 mg total) by mouth daily.    [name] atorvastatin (LIPITOR) 40 MG tablet Take 1 tablet (40 mg total) by mouth daily. (Patient taking differently: Take 40 mg by mouth  at bedtime.) 90 tablet 3  . DULoxetine HCl 30 MG CSDR Take 30 mg by mouth 2 (two) times daily.    [name] ezetimibe (ZETIA) 10 MG tablet TAKE 1 TABLET BY MOUTH ONCE DAILY 90 tablet 3  . fish oil-omega-3 fatty acids 1000 MG capsule Take 1 capsule (1 g total) by mouth daily. 60 capsule 1  . levothyroxine (SYNTHROID) 25 MCG tablet Take 25 mcg by mouth daily.    [name] lisinopril (ZESTRIL) 2.5 MG tablet Take 2.5 mg by mouth daily.    . metFORMIN (GLUCOPHAGE) 500 MG tablet Take 1 tablet (500 mg total) by mouth daily with breakfast. (Patient taking differently: Take 500 mg by mouth 2 (two) times daily with a meal.) 90 tablet 3  . metoprolol tartrate (LOPRESSOR) 25 MG tablet Take 1 tablet by mouth two  times daily (Patient taking differently: Take 25 mg by mouth 2 (two) times daily.) 60 tablet 0  . Multiple Vitamin (MULTIVITAMIN) tablet Take 1 tablet by mouth daily. 30 tablet 10  . nitroGLYCERIN (NITROSTAT) 0.4 MG SL tablet Place 0.4 mg under the tongue every 2 (two) hours as needed for chest pain.    . pantoprazole (PROTONIX) 40 MG tablet Take 1 tablet (40 mg total) by mouth daily. 30 tablet 10  . pantoprazole (PROTONIX) 40 MG tablet TAKE 1 TABLET BY MOUTH ONCE DAILY 30 tablet 6  . polyethylene glycol (MIRALAX / GLYCOLAX) 17 g packet Take 17 g by mouth daily as needed for  mild constipation or moderate constipation.    . pregabalin (LYRICA) 150 MG capsule Take 1 capsule (150 mg total) by mouth daily. (Patient taking differently: Take 300 mg by mouth 2 (two) times daily.) 30 capsule 0  . Propylene Glycol (SYSTANE BALANCE) 0.6 % SOLN Apply 1 drop to eye 4 (four) times daily.    . sildenafil (VIAGRA) 100 MG tablet Take 0.5-1 tablets (50-100 mg total) by mouth daily as needed for erectile dysfunction. 5 tablet 11  . tamsulosin (FLOMAX) 0.4 MG CAPS capsule TAKE 1 CAPSULE BY MOUTH DAILY. (Patient taking differently: Take 0.4 mg by mouth at bedtime.) 30 capsule 2   No current facility-administered medications for this visit.    REVIEW OF SYSTEMS:  [X]  denotes positive finding, [ ]  denotes negative finding Cardiac  Comments:  Chest pain or chest pressure:    Shortness of breath upon exertion: x   Short of breath when lying flat:    Irregular heart rhythm:        Vascular    Pain in calf, thigh, or hip brought on by ambulation: x   Pain in feet at night that wakes you up from your sleep:  x   Blood clot in your veins:    Leg swelling:         Pulmonary    Oxygen at home:    Productive cough:     Wheezing:         Neurologic    Sudden weakness in arms or legs:  x   Sudden numbness in arms or legs:  x   Sudden onset of difficulty speaking or slurred speech: x   Temporary loss of vision in one eye:  x   Problems with dizziness:  x       Gastrointestinal    Blood in stool:     Vomited blood:         Genitourinary    Burning when urinating:     Blood in urine:  Psychiatric    Major depression:         Hematologic    Bleeding problems:    Problems with blood clotting too easily:        Skin    Rashes or ulcers:        Constitutional    Fever or chills:      PHYSICAL EXAM  Vitals:   02/25/21 1050  BP: 101/61  Pulse: 82  Resp: 20  Temp: 97.9 F (36.6 C)  SpO2: 98%  Weight: 232 lb (105.2 kg)  Height: 5\' 9"  (1.753 m)     Constitutional: well appearing. no distress. Appears well nourished.  Neurologic: CN intact. no focal findings. no sensory loss. Psychiatric: Mood and affect symmetric and appropriate. Eyes: No icterus. No conjunctival pallor. Ears, nose, throat: mucous membranes moist. Midline trachea.  Cardiac: regular rate and rhythm.  Respiratory: unlabored. Abdominal: soft, non-tender, non-distended.  Peripheral vascular:  Absent R femoral pulse  2+ L femoral pulse Extremity: No edema. No cyanosis. No pallor.  Skin: No gangrene. No ulceration.  Lymphatic: No Stemmer's sign. No palpable lymphadenopathy.  PERTINENT LABORATORY AND RADIOLOGIC DATA  Most recent CBC CBC Latest Ref Rng & Units 02/04/2021 03/21/2019 12/25/2018  WBC 3.4 - 10.8 x10E3/uL 4.7 4.6 5.3  Hemoglobin 13.0 - 17.7 g/dL 12.9(L) 12.3(L) 11.6(L)  Hematocrit 37.5 - 51.0 % 38.8 36.2(L) 36.2(L)  Platelets 150 - 450 x10E3/uL 284 346 279     Most recent CMP CMP Latest Ref Rng & Units 02/04/2021 08/09/2020 01/08/2020  Glucose 65 - 99 mg/dL 118(H) - 120(H)  BUN 8 - 27 mg/dL 11 - 13  Creatinine 0.76 - 1.27 mg/dL 0.86 - 0.97  Sodium 134 - 144 mmol/L 142 - 143  Potassium 3.5 - 5.2 mmol/L 4.5 - 5.0  Chloride 96 - 106 mmol/L 102 - 105  CO2 20 - 29 mmol/L 22 - 25  Calcium 8.6 - 10.2 mg/dL 10.3(H) - 9.8  Total Protein 6.0 - 8.5 g/dL - 7.2 7.0  Total Bilirubin 0.0 - 1.2 mg/dL - 0.3 0.3  Alkaline Phos 44 - 121 IU/L - 102 81  AST 0 - 40 IU/L - 25 30  ALT 0 - 44 IU/L - 18 32    Renal function Estimated Creatinine Clearance: 104 mL/min (by C-G formula based on SCr of 0.86 mg/dL).  Hgb A1c MFr Bld (%)  Date Value  12/08/2019 6.9 (H)    LDL Chol Calc (NIH)  Date Value Ref Range Status  08/09/2020 49 0 - 99 mg/dL Final   Direct LDL  Date Value Ref Range Status  11/06/2013 80 mg/dL Final    Comment:    ATP III Classification (LDL):       < 100        mg/dL         Optimal      100 - 129     mg/dL         Near or Above Optimal       130 - 159     mg/dL         Borderline High      160 - 189     mg/dL         High       > 190        mg/dL         Very High       Dr. Tyrell Antonio angiogram personally reviewed. Severe, exophytic flow-limiting plaque in the right  common femoral artery.  Yevonne Aline. Stanford Breed, MD [facility] Phone Number: [phone] 02/24/2021 11:51 AM

## 2021-02-25 ENCOUNTER — Other Ambulatory Visit: Payer: Self-pay

## 2021-02-25 ENCOUNTER — Ambulatory Visit (INDEPENDENT_AMBULATORY_CARE_PROVIDER_SITE_OTHER): Payer: Medicare Other | Admitting: Vascular Surgery

## 2021-02-25 ENCOUNTER — Encounter: Payer: Self-pay | Admitting: Vascular Surgery

## 2021-02-25 VITALS — BP 101/61 | HR 82 | Temp 97.9°F | Resp 20 | Ht 69.0 in | Wt 232.0 lb

## 2021-02-25 DIAGNOSIS — I70211 Atherosclerosis of native arteries of extremities with intermittent claudication, right leg: Secondary | ICD-10-CM

## 2021-02-27 NOTE — Progress Notes (Signed)
Patient no showed for 02/28/21 2 PM PAT appt.  Called patient for phone interview to review PAT information and gave instructions for DOS.    Internal Med [facility] Dr Rusty [facility] [place] PCP - Dr Delia Chimes Cardiologist - Dr Kathlyn [place]  CT/Chest x-ray - 05/03/20 EKG - 02/12/21 Stress Test - 12/11/15 ECHO - n/a Cardiac Cath - 12/25/15  Sleep Study -  Yes CPAP - does not use cpap  Fasting Blood Sugar - unknown Checks Blood Sugar 0  Anesthesia review: Yes  Coronavirus Screening Covid test on DOS 03/03/21 Do you have any of the following symptoms:  Cough yes/no: No Fever (>100.62F)  yes/no: No Runny nose yes/no: No Sore throat yes/no: No Difficulty breathing/shortness of breath  yes/no: No  Have you traveled in the last 14 days and where? yes/no: No  Patient verbalized understanding of instructions that were given to them via phone.

## 2021-02-27 NOTE — Progress Notes (Addendum)
Surgical Instructions    Your procedure is scheduled on Monday, 03/03/21.  Report to [facility] Main Entrance "A" at 5:30 A.M., then check in with the Admitting office.  Call this number if you have problems the morning of surgery:  [phone]   If you have any questions prior to your surgery date call [phone]: Open Monday-Friday 8am-4pm    Remember:  Do not eat or drink after midnight the night before your surgery-Sunday    Take these medicines the morning of surgery with A SIP OF WATER: DULoxetine ezetimibe (ZETIA levothyroxine (SYNTHROID) metoprolol tartrate (LOPRESSOR) nitroGLYCERIN if needed pantoprazole (PROTONIX)  pregabalin (LYRICA)  Propylene Glycol (SYSTANE EYE DROPS)   . Do not take metformin on the morning of surgery.  . If your blood sugar is less than 70 mg/dL, you will need to treat for low blood sugar: o Treat a low blood sugar (less than 70 mg/dL) with  cup of clear juice (cranberry or apple), 4 glucose tablets, OR glucose gel. o Recheck blood sugar in 15 minutes after treatment (to make sure it is greater than 70 mg/dL). If your blood sugar is not greater than 70 mg/dL on recheck, call [phone] for further instructions.  Aspirin Instructions: Follow your surgeon's instructions on when to stop aspirin prior to surgery,  If no instructions were given by your surgeon then you will need to call the office for those instructions.  As of today, STOP taking any Aspirin (unless otherwise instructed by your surgeon) Aleve, Naproxen, Ibuprofen, Motrin, Advil, Goody's, BC's, all herbal medications, fish oil, and all vitamins.                     Do not wear jewelry.            Do not wear lotions, powders, colognes, or deodorant.             Men may shave face and neck.            Do not bring valuables to the hospital.            [facility] is not responsible for any belongings or valuables.  Do NOT Smoke (Tobacco/Vaping) or drink Alcohol 24 hours prior to  your procedure If you use a CPAP at night, you may bring all equipment for your overnight stay.   Contacts, glasses, dentures or bridgework may not be worn into surgery, please bring cases for these belongings   For patients admitted to the hospital, discharge time will be determined by your treatment team.    Special instructions:   [place]- Preparing For Surgery  Before surgery, you can play an important role. Because skin is not sterile, your skin needs to be as free of germs as possible. You can reduce the number of germs on your skin by washing with CHG (chlorahexidine gluconate) Soap before surgery.  CHG is an antiseptic cleaner which kills germs and bonds with the skin to continue killing germs even after washing.    Oral Hygiene is also important to reduce your risk of infection.  Remember - BRUSH YOUR TEETH THE MORNING OF SURGERY WITH YOUR REGULAR TOOTHPASTE  Please do not use if you have an allergy to CHG or antibacterial soaps. If your skin becomes reddened/irritated stop using the CHG.  Do not shave (including legs and underarms) for at least 48 hours prior to first CHG shower. It is OK to shave your face.  Please follow these instructions carefully.  1. Shower the NIGHT BEFORE SURGERY-Sunday and the MORNING OF SURGERY-Monday  2. If you chose to wash your hair, wash your hair first as usual with your normal shampoo.  3. After you shampoo, rinse your hair and body thoroughly to remove the shampoo.  4. Wash Face and genitals (private parts) with your normal soap.   5.  Shower the NIGHT BEFORE SURGERY and the MORNING OF SURGERY with CHG Soap.   6. Use CHG Soap as you would any other liquid soap. You can apply CHG directly to the skin and wash gently with a scrungie or a clean washcloth.   7. Apply the CHG Soap to your body ONLY FROM THE NECK DOWN.  Do not use on open wounds or open sores. Avoid contact with your eyes, ears, mouth and genitals (private parts). Wash Face  and genitals (private parts)  with your normal soap.   8. Wash thoroughly, paying special attention to the area where your surgery will be performed.  9. Thoroughly rinse your body with warm water from the neck down.  10. DO NOT shower/wash with your normal soap after using and rinsing off the CHG Soap.  11. Pat yourself dry with a CLEAN TOWEL.  12. Wear CLEAN PAJAMAS to bed the night before surgery  13. Place CLEAN SHEETS on your bed the night before your surgery  14. DO NOT SLEEP WITH PETS.   Day of Surgery: Take a shower with CHG soap. Wear Clean/Comfortable clothing the morning of surgery Do not apply any deodorants/lotions.   Remember to brush your teeth WITH YOUR REGULAR TOOTHPASTE.   Please read over the following fact sheets that you were given.

## 2021-02-28 ENCOUNTER — Encounter (HOSPITAL_COMMUNITY): Payer: Self-pay

## 2021-02-28 ENCOUNTER — Other Ambulatory Visit (HOSPITAL_COMMUNITY): Payer: Medicare Other

## 2021-02-28 ENCOUNTER — Inpatient Hospital Stay (HOSPITAL_COMMUNITY)
Admission: RE | Admit: 2021-02-28 | Discharge: 2021-02-28 | Disposition: A | Discharge status: Home / Self Care | Payer: Medicare Other | Source: Ambulatory Visit

## 2021-02-28 ENCOUNTER — Other Ambulatory Visit: Payer: Self-pay

## 2021-02-28 ENCOUNTER — Other Ambulatory Visit (HOSPITAL_COMMUNITY): Payer: Self-pay

## 2021-02-28 MED FILL — Pantoprazole Sodium EC Tab 40 MG (Base Equiv): ORAL | 30 days supply | Qty: 30 | Fill #0 | Status: AC

## 2021-02-28 NOTE — Anesthesia Preprocedure Evaluation (Addendum)
Anesthesia Evaluation  Patient identified by MRN, date of birth, ID band Patient awake    Reviewed: Allergy & Precautions, NPO status , Patient's Chart, lab work & pertinent test results, reviewed documented beta blocker date and time   Airway Mallampati: III  TM Distance: >3 FB Neck ROM: Full    Dental  (+) Dental Advisory Given, Edentulous Upper, Partial Lower   Pulmonary sleep apnea , former smoker,    Pulmonary exam normal breath sounds clear to auscultation       Cardiovascular hypertension, Pt. on home beta blockers and Pt. on medications + CAD, + Past MI, + CABG and + Peripheral Vascular Disease (s/p bilat SFA stents)  Normal cardiovascular exam Rhythm:Regular Rate:Normal     Neuro/Psych H/o pituitary adenoma s/p resection 2020    GI/Hepatic GERD  Medicated,H/o pancreatic cancer   Endo/Other  diabetes, Type 2, Oral Hypoglycemic AgentsObesity   Renal/GU negative Renal ROS     Musculoskeletal  (+) Arthritis ,   Abdominal   Peds  Hematology  (+) Blood dyscrasia, anemia ,   Anesthesia Other Findings   Reproductive/Obstetrics                           Anesthesia Physical Anesthesia Plan  ASA: III  Anesthesia Plan: General   Post-op Pain Management:    Induction: Intravenous  PONV Risk Score and Plan: 2 and Midazolam, Dexamethasone and Ondansetron  Airway Management Planned: Oral ETT  Additional Equipment: Arterial line  Intra-op Plan:   Post-operative Plan: Extubation in OR  Informed Consent: I have reviewed the patients History and Physical, chart, labs and discussed the procedure including the risks, benefits and alternatives for the proposed anesthesia with the patient or authorized representative who has indicated his/her understanding and acceptance.     Dental advisory given  Plan Discussed with: CRNA  Anesthesia Plan Comments: (PAT note written 02/28/2021 by  Myra Gianotti, PA-C. )      Anesthesia Quick Evaluation

## 2021-02-28 NOTE — Progress Notes (Signed)
Anesthesia Chart Review: SAME DAY WORK-UP as he was a no show to 02/28/21 PAT visit   Case: [id number] Date/Time: 03/03/21 0715   Procedure: RIGHT FEMORAL ENDARTERECTOMY (Right )   Anesthesia type: General   Pre-op diagnosis: PAD   Location: [facility] OR ROOM 16 / [place] OR   Surgeons: Cherre Robins, MD      DISCUSSION: Patient is a 65 year old male scheduled for the above procedure. He was referred to Dr. Stanford Breed for surgery by his cardiologist Dr. Fletcher Anon.  History includes former smoker (quit 11/29/08), HTN, CAD (NSTEMI, s/p DES OM1, failed PCI occluded RCA 04/02/04; ACS 01/07/10, s/p cutting balloon arthrotomy, PTCA AV groove CX and ostial OM; CABG: LIMA-LAD, SVG-OM1, SVG-dRCA-OM2 09/18/10: LHC 12/25/15: patent SVG-OM, occluded SVG-RCA with left to right collaterals, atretic LIMA-LAD with no obstructive disease, medical therapy), PAD (s/p right SFA stent 01/08/09, left SFA stent 02/18/09; left SFA atherectomy, aspiration thrombectomy, balloon angioplasty left SFA 09/13/13; left SFA drug coated balloon angioplasty 08/29/14), HLD, GERD, Bell's palsy, anemia, pancreatic cancer (s/p distal pancreatectomy and splenectomy 05/10/08, pathology: "solid pseudo-papillary tumor of the pancreas 9.8 cm.  Margins were negative but the tumor did appear to invade the capsule, consistent with a low grade malignancy"), DM2, pituitary macroadenoma (s/p transphenoidal resection of pituitary mass 01/04/19), OSA, RML pulmonary nodule (favored to be benign 05/29/20 PET scan, 6 month follow-up recommended), back surgery (L4-5 laminectomy 1990's), chronic back pain.  Alcohol use is currently documented as 2 drinks of brandy/week.  He did not show for his 02/28/21 presurgical COVID-19 test or PAT RN Visit, so RN same-day work-up call to be completed.  He is for labs and anesthesia team evaluation on the day of surgery.    VS:  BP Readings from Last 3 Encounters:  02/25/21 101/61  02/19/21 117/67  02/12/21 110/70   Pulse Readings from Last 3  Encounters:  02/25/21 82  02/19/21 (!) 55  02/12/21 67    PROVIDERS: [name], MD is PCP. His [facility] PCP is Bea Laura, MD.  Kathlyn Sacramento, MD is cardiologist - Christinia Gully, MD is pulmonologist - Angelene Giovanni, MD is neurosurgeon (for pituitary resection). See [facility] Everywhere.    LABS: For day of surgery. Most recent comparison labs include: Lab Results  Component Value Date   WBC 4.7 02/04/2021   HGB 12.9 (L) 02/04/2021   HCT 38.8 02/04/2021   PLT 284 02/04/2021   GLUCOSE 118 (H) 02/04/2021   ALT 18 08/09/2020   AST 25 08/09/2020   NA 142 02/04/2021   K 4.5 02/04/2021   CL 102 02/04/2021   CREATININE 0.86 02/04/2021   BUN 11 02/04/2021   CO2 22 02/04/2021   HGBA1C 6.9 (H) 12/08/2019    IMAGES: PET Scan 05/29/20: IMPRESSION: 1. Very low metabolic activity associated with the RIGHT middle lobe pulmonary nodule favors benign etiology. As lesion is new from remote scans, recommend either a CT follow-up ( 6 months ) or biopsy to support benignity. 2. Enlargement of the LEFT adrenal gland with low metabolic activity favors benign etiology. High-density could represent prior adrenal hemorrhage. 3. No evidence of local pancreatic colon cancer recurrence or adenopathy. - Dr. Melvyn Novas recommended repeat chest CT in 6 months.  CT Chest 05/03/20: IMPRESSION: 1. As seen on recent abdominal CT, there is a 2.0 cm right middle lobe pulmonary nodule which is new from older prior studies. This could reflect a solitary metastasis or primary bronchogenic carcinoma. No other suspicious pulmonary nodules. 2. Left adrenal  nodule, enlarged from older prior studies. This too could reflect metastatic disease or local recurrence. Regenerated splenic tissue is a consideration given previous splenectomy. 3. Consider further evaluation with follow-up PET-CT. Alternatively, tissue sampling could be attempted. 4. Aortic Atherosclerosis (ICD10-I70.0) and Emphysema  (ICD10-J43.9).   EKG: 02/12/21: NSR   CV: Abdominal Aortogram 02/19/21 (Dr. Fletcher Anon): Conclusion: 1.  No significant aortoiliac disease. 2.  Right lower extremity: Severe calcified stenosis in the common femoral artery, patent SFA stent with moderate in-stent restenosis and two-vessel runoff below the knee with an occluded anterior tibial artery. 3.  Left lower extremity: Mild common femoral artery disease, patent SFA stent with minimal restenosis and two-vessel runoff below the knee. - Recommendations: Recommend right common femoral artery endarterectomy.  I consulted Dr. Stanford Breed.   Cardiac cath (due to abnormal stress test 12/11/15)  Prox Cx to Mid Cx lesion, 99% stenosed. The lesion was previously treated with a stent (unknown type).  Prox LAD to Mid LAD lesion, 30% stenosed.  Prox RCA lesion, 100% stenosed.  SVG was injected is normal in caliber, and is anatomically normal.  SVG was injected .  Origin lesion, 100% stenosed.  The left ventricular systolic function is normal.   1. Significant 2 vessel coronary artery disease with patent SVG to OM 2. SVG to RCA is occluded and LIMA to LAD is atretic. However, there is no obstructive disease affecting the LAD system. The right coronary artery is chronically occluded with good left-to-right collaterals.  2. Normal LV systolic function and mildly elevated left ventricular end-diastolic pressure.  Recommendations: Continue aggressive medical therapy. The RCA does not appear favorable for CTO PCI.   Nuclear stress test 12/11/15:  Nuclear stress EF: 46%.  Defect 1: There is a medium defect of moderate severity present in the apex location This defect is fixed but that region of the LV contracts normally .  There is also a small fixed defect in the inferior base that is consistent with a previous Inferiobasal MI. The Inferior wall is moderately hypokinetic.    Past Medical History:  Diagnosis Date  . Anemia, unspecified   .  Bell's palsy   . Blood transfusion    during treatment for Ca  . Blood transfusion without reported diagnosis   . CAD (coronary artery disease)    a. s/p multiple PCIs;  b. s/p CABG in 09/2010 (LIMA-LAD, SVG-OM1, SVG-distal RCA/OM2);  Cardiac cath in 12/2015: Mild LAD disease, occluded mid LCX and proximal RCA (left to right collaterals), Occluded SVG to RCA and atretic LIMA. Patent SVG to OM  . Chronic back pain   . Diabetes mellitus without complication ([place])   . DJD (degenerative joint disease)    low back & all over   . GERD (gastroesophageal reflux disease)   . Helicobacter pylori (H. pylori) infection   . Hemorrhoids   . Hyperlipidemia   . Hypertension   . Impotence of organic origin   . Myocardial infarction ([place])    2005 and 2012   . PAD (peripheral artery disease) ([place])    a. s/p bilat SFA stents;  b. ABIs 11/2012: R 0.99, L 0.86.  [name] Pancreatic cancer [facility])    surgery 2009  . Pituitary adenoma [facility])    surgery at [facility] Feb 2020  . Sleep apnea 06/05/2019    Past Surgical History:  Procedure Laterality Date  . ABDOMINAL AORTAGRAM N/A 09/13/2013   Procedure: ABDOMINAL Maxcine Ham;  Surgeon: Wellington Hampshire, MD;  Location: [place] CATH LAB;  Service: Cardiovascular;  Laterality: N/A;  . ABDOMINAL AORTAGRAM N/A 08/29/2014   Procedure: ABDOMINAL AORTAGRAM;  Surgeon: Wellington Hampshire, MD;  Location: [place] CATH LAB;  Service: Cardiovascular;  Laterality: N/A;  . ABDOMINAL AORTOGRAM W/LOWER EXTREMITY N/A 02/19/2021   Procedure: ABDOMINAL AORTOGRAM W/LOWER EXTREMITY;  Surgeon: Wellington Hampshire, MD;  Location: [place] CV LAB;  Service: Cardiovascular;  Laterality: N/A;  . BACK SURGERY     1992  . CARDIAC CATHETERIZATION N/A 12/25/2015   Procedure: Left Heart Cath and Cors/Grafts Angiography;  Surgeon: Wellington Hampshire, MD;  Location: [place] CV LAB;  Service: Cardiovascular;  Laterality: N/A;  . CORONARY ARTERY BYPASS GRAFT  nov 2011   x 4  . ESOPHAGOGASTRODUODENOSCOPY (EGD) WITH  PROPOFOL N/A 04/02/2017   Procedure: ESOPHAGOGASTRODUODENOSCOPY (EGD) WITH PROPOFOL;  Surgeon: Carol Ada, MD;  Location: [facility] ENDOSCOPY;  Service: Endoscopy;  Laterality: N/A;  . HEMORRHOID SURGERY  10/30/2011   Procedure: HEMORRHOIDECTOMY;  Surgeon: Harl Bowie, MD;  Location: [place];  Service: General;  Laterality: N/A;  . JOINT REPLACEMENT    . pancreatic  cancer  05/10/2008   Resection of distal panrease and spleen  . PARTIAL KNEE ARTHROPLASTY Right 11/19/2014   Procedure: RIGHT UNI-KNEE ARTHROPLASTY MEDIALLY;  Surgeon: Mauri Pole, MD;  Location: [facility] ORS;  Service: Orthopedics;  Laterality: Right;  . PERIPHERAL VASCULAR CATHETERIZATION  Oct 2014   Lt SFA PTA  . PERIPHERAL VASCULAR CATHETERIZATION  Oct 2015   ISR Lt SFA-PTA  . right partial knee replacement    . splenectomy    . THROMBECTOMY FEMORAL ARTERY Left 09/13/2013   Procedure: THROMBECTOMY FEMORAL ARTERY;  Surgeon: Wellington Hampshire, MD;  Location: [place] CATH LAB;  Service: Cardiovascular;  Laterality: Left;  . TRANSPHENOIDAL / TRANSNASAL HYPOPHYSECTOMY / RESECTION PITUITARY TUMOR  12/2018   [facility]    MEDICATIONS: . amoxicillin (AMOXIL) 500 MG capsule  . aspirin EC 81 MG tablet  . atorvastatin (LIPITOR) 40 MG tablet  . DULoxetine HCl 30 MG CSDR  . ezetimibe (ZETIA) 10 MG tablet  . fish oil-omega-3 fatty acids 1000 MG capsule  . levothyroxine (SYNTHROID) 25 MCG tablet  . lisinopril (ZESTRIL) 2.5 MG tablet  . metFORMIN (GLUCOPHAGE) 500 MG tablet  . metoprolol tartrate (LOPRESSOR) 25 MG tablet  . Multiple Vitamin (MULTIVITAMIN) tablet  . nitroGLYCERIN (NITROSTAT) 0.4 MG SL tablet  . pantoprazole (PROTONIX) 40 MG tablet  . pantoprazole (PROTONIX) 40 MG tablet  . polyethylene glycol (MIRALAX / GLYCOLAX) 17 g packet  . pregabalin (LYRICA) 150 MG capsule  . Propylene Glycol (SYSTANE BALANCE) 0.6 % SOLN  . sildenafil (VIAGRA) 100 MG tablet  . tamsulosin (FLOMAX) 0.4 MG CAPS capsule   No current facility-administered  medications for this encounter.    Myra Gianotti, PA-C Surgical Short Stay/Anesthesiology [facility] Phone [phone] [facility] Phone [phone] 02/28/2021 3:03 PM

## 2021-03-03 ENCOUNTER — Other Ambulatory Visit: Payer: Self-pay

## 2021-03-03 ENCOUNTER — Encounter (HOSPITAL_COMMUNITY)
Admission: RE | Disposition: A | Discharge status: Home Health | Payer: Self-pay | Source: Home / Self Care | Attending: Vascular Surgery

## 2021-03-03 ENCOUNTER — Inpatient Hospital Stay (HOSPITAL_COMMUNITY): Payer: Medicare Other | Admitting: Vascular Surgery

## 2021-03-03 ENCOUNTER — Inpatient Hospital Stay (HOSPITAL_COMMUNITY): Payer: Medicare Other | Admitting: Certified Registered Nurse Anesthetist

## 2021-03-03 ENCOUNTER — Inpatient Hospital Stay (HOSPITAL_COMMUNITY)
Admission: RE | Admit: 2021-03-03 | Discharge: 2021-03-05 | DRG: 254 | Disposition: A | Discharge status: Home Health | Payer: Medicare Other | Attending: Vascular Surgery | Admitting: Vascular Surgery

## 2021-03-03 ENCOUNTER — Encounter (HOSPITAL_COMMUNITY): Payer: Self-pay | Admitting: Vascular Surgery

## 2021-03-03 DIAGNOSIS — E785 Hyperlipidemia, unspecified: Secondary | ICD-10-CM | POA: Diagnosis present

## 2021-03-03 DIAGNOSIS — M5459 Other low back pain: Secondary | ICD-10-CM | POA: Diagnosis present

## 2021-03-03 DIAGNOSIS — Z9081 Acquired absence of spleen: Secondary | ICD-10-CM

## 2021-03-03 DIAGNOSIS — I10 Essential (primary) hypertension: Secondary | ICD-10-CM | POA: Diagnosis present

## 2021-03-03 DIAGNOSIS — Z951 Presence of aortocoronary bypass graft: Secondary | ICD-10-CM | POA: Diagnosis not present

## 2021-03-03 DIAGNOSIS — Z96651 Presence of right artificial knee joint: Secondary | ICD-10-CM | POA: Diagnosis present

## 2021-03-03 DIAGNOSIS — I2510 Atherosclerotic heart disease of native coronary artery without angina pectoris: Secondary | ICD-10-CM | POA: Diagnosis present

## 2021-03-03 DIAGNOSIS — D649 Anemia, unspecified: Secondary | ICD-10-CM | POA: Diagnosis present

## 2021-03-03 DIAGNOSIS — Z79899 Other long term (current) drug therapy: Secondary | ICD-10-CM | POA: Diagnosis not present

## 2021-03-03 DIAGNOSIS — G8929 Other chronic pain: Secondary | ICD-10-CM | POA: Diagnosis present

## 2021-03-03 DIAGNOSIS — E1151 Type 2 diabetes mellitus with diabetic peripheral angiopathy without gangrene: Principal | ICD-10-CM | POA: Diagnosis present

## 2021-03-03 DIAGNOSIS — I252 Old myocardial infarction: Secondary | ICD-10-CM

## 2021-03-03 DIAGNOSIS — N529 Male erectile dysfunction, unspecified: Secondary | ICD-10-CM | POA: Diagnosis present

## 2021-03-03 DIAGNOSIS — Z7982 Long term (current) use of aspirin: Secondary | ICD-10-CM | POA: Diagnosis not present

## 2021-03-03 DIAGNOSIS — Z7984 Long term (current) use of oral hypoglycemic drugs: Secondary | ICD-10-CM

## 2021-03-03 DIAGNOSIS — G4730 Sleep apnea, unspecified: Secondary | ICD-10-CM | POA: Diagnosis present

## 2021-03-03 DIAGNOSIS — Z87891 Personal history of nicotine dependence: Secondary | ICD-10-CM | POA: Diagnosis not present

## 2021-03-03 DIAGNOSIS — I739 Peripheral vascular disease, unspecified: Secondary | ICD-10-CM | POA: Diagnosis present

## 2021-03-03 DIAGNOSIS — D352 Benign neoplasm of pituitary gland: Secondary | ICD-10-CM | POA: Diagnosis present

## 2021-03-03 DIAGNOSIS — Z20822 Contact with and (suspected) exposure to covid-19: Secondary | ICD-10-CM | POA: Diagnosis present

## 2021-03-03 DIAGNOSIS — Z8249 Family history of ischemic heart disease and other diseases of the circulatory system: Secondary | ICD-10-CM

## 2021-03-03 DIAGNOSIS — I70221 Atherosclerosis of native arteries of extremities with rest pain, right leg: Secondary | ICD-10-CM | POA: Diagnosis present

## 2021-03-03 DIAGNOSIS — Z8507 Personal history of malignant neoplasm of pancreas: Secondary | ICD-10-CM

## 2021-03-03 DIAGNOSIS — I70211 Atherosclerosis of native arteries of extremities with intermittent claudication, right leg: Secondary | ICD-10-CM | POA: Diagnosis not present

## 2021-03-03 HISTORY — PX: ENDARTERECTOMY FEMORAL: SHX5804

## 2021-03-03 HISTORY — PX: PATCH ANGIOPLASTY: SHX6230

## 2021-03-03 LAB — COMPREHENSIVE METABOLIC PANEL WITH GFR
ALT: 23 U/L (ref 0–44)
AST: 25 U/L (ref 15–41)
Albumin: 3.9 g/dL (ref 3.5–5.0)
Alkaline Phosphatase: 69 U/L (ref 38–126)
Anion gap: 11 (ref 5–15)
BUN: 11 mg/dL (ref 8–23)
CO2: 24 mmol/L (ref 22–32)
Calcium: 9.5 mg/dL (ref 8.9–10.3)
Chloride: 103 mmol/L (ref 98–111)
Creatinine, Ser: 0.93 mg/dL (ref 0.61–1.24)
GFR, Estimated: >60 mL/min
Glucose, Bld: 143 mg/dL — ABNORMAL HIGH (ref 70–99)
Potassium: 4.6 mmol/L (ref 3.5–5.1)
Sodium: 138 mmol/L (ref 135–145)
Total Bilirubin: 0.5 mg/dL (ref 0.3–1.2)
Total Protein: 6.7 g/dL (ref 6.5–8.1)

## 2021-03-03 LAB — URINALYSIS, ROUTINE W REFLEX MICROSCOPIC
Bilirubin Urine: NEGATIVE
Glucose, UA: NEGATIVE mg/dL
Hgb urine dipstick: NEGATIVE
Ketones, ur: NEGATIVE mg/dL
Leukocytes,Ua: NEGATIVE
Nitrite: NEGATIVE
Protein, ur: NEGATIVE mg/dL
Specific Gravity, Urine: 1.023 (ref 1.005–1.030)
pH: 5 (ref 5.0–8.0)

## 2021-03-03 LAB — CBC
HCT: 35.6 % — ABNORMAL LOW (ref 39.0–52.0)
HCT: 33.9 % — ABNORMAL LOW (ref 39.0–52.0)
Hemoglobin: 11.2 g/dL — ABNORMAL LOW (ref 13.0–17.0)
Hemoglobin: 11 g/dL — ABNORMAL LOW (ref 13.0–17.0)
MCH: 29.9 pg (ref 26.0–34.0)
MCH: 29.7 pg (ref 26.0–34.0)
MCHC: 31.5 g/dL (ref 30.0–36.0)
MCHC: 32.4 g/dL (ref 30.0–36.0)
MCV: 94.9 fL (ref 80.0–100.0)
MCV: 91.6 fL (ref 80.0–100.0)
Platelets: 354 10*3/uL (ref 150–400)
Platelets: 341 10*3/uL (ref 150–400)
RBC: 3.75 MIL/uL — ABNORMAL LOW (ref 4.22–5.81)
RBC: 3.7 MIL/uL — ABNORMAL LOW (ref 4.22–5.81)
RDW: 14.6 % (ref 11.5–15.5)
RDW: 14.6 % (ref 11.5–15.5)
WBC: 4.8 10*3/uL (ref 4.0–10.5)
WBC: 7.3 10*3/uL (ref 4.0–10.5)
nRBC: 0 % (ref 0.0–0.2)
nRBC: 0 % (ref 0.0–0.2)

## 2021-03-03 LAB — TYPE AND SCREEN
ABO/RH(D): O POS
Antibody Screen: NEGATIVE

## 2021-03-03 LAB — POCT ACTIVATED CLOTTING TIME
Activated Clotting Time: 243 seconds
Activated Clotting Time: 231 seconds

## 2021-03-03 LAB — PROTIME-INR
INR: 1 (ref 0.8–1.2)
Prothrombin Time: 13.3 s (ref 11.4–15.2)

## 2021-03-03 LAB — GLUCOSE, CAPILLARY
Glucose-Capillary: 120 mg/dL — ABNORMAL HIGH (ref 70–99)
Glucose-Capillary: 154 mg/dL — ABNORMAL HIGH (ref 70–99)
Glucose-Capillary: 157 mg/dL — ABNORMAL HIGH (ref 70–99)
Glucose-Capillary: 161 mg/dL — ABNORMAL HIGH (ref 70–99)

## 2021-03-03 LAB — HEMOGLOBIN A1C
Hgb A1c MFr Bld: 7.4 % — ABNORMAL HIGH (ref 4.8–5.6)
Hgb A1c MFr Bld: 7.6 % — ABNORMAL HIGH (ref 4.8–5.6)
Mean Plasma Glucose: 166 mg/dL
Mean Plasma Glucose: 171.42 mg/dL

## 2021-03-03 LAB — SURGICAL PCR SCREEN
MRSA, PCR: NEGATIVE
Staphylococcus aureus: NEGATIVE

## 2021-03-03 LAB — CREATININE, SERUM
Creatinine, Ser: 1.02 mg/dL (ref 0.61–1.24)
GFR, Estimated: >60 mL/min (ref >60)

## 2021-03-03 LAB — APTT: aPTT: 29 s (ref 24–36)

## 2021-03-03 LAB — SARS CORONAVIRUS 2 BY RT PCR (HOSPITAL ORDER, PERFORMED IN ~~LOC~~ HOSPITAL LAB): SARS Coronavirus 2: NEGATIVE

## 2021-03-03 SURGERY — ENDARTERECTOMY, FEMORAL
Anesthesia: General | Site: Groin | Laterality: Right

## 2021-03-03 MED ORDER — PHENOL 1.4 % MT LIQD: 1.0000 | OROMUCOSAL | Status: DC | PRN

## 2021-03-03 MED ORDER — ACETAMINOPHEN 325 MG PO TABS
325.0000 mg | ORAL_TABLET | ORAL | Status: DC | PRN
Start: 2021-03-03 — End: 2021-03-05

## 2021-03-03 MED ORDER — METOPROLOL TARTRATE 5 MG/5ML IV SOLN: 2.0000 mg | INTRAVENOUS | Status: DC | PRN

## 2021-03-03 MED ORDER — PHENYLEPHRINE HCL-NACL 10-0.9 MG/250ML-% IV SOLN
INTRAVENOUS | Status: DC | PRN
  Administered 2021-03-03: 60 ug/min via INTRAVENOUS

## 2021-03-03 MED ORDER — FENTANYL CITRATE (PF) 100 MCG/2ML IJ SOLN
25.0000 ug | INTRAMUSCULAR | Status: DC | PRN
  Administered 2021-03-03: 25 ug via INTRAVENOUS

## 2021-03-03 MED ORDER — LACTATED RINGERS IV SOLN: INTRAVENOUS | Status: DC | PRN

## 2021-03-03 MED ORDER — PROTAMINE SULFATE 10 MG/ML IV SOLN
INTRAVENOUS | Status: DC | PRN
  Administered 2021-03-03 (×6): 10 mg via INTRAVENOUS

## 2021-03-03 MED ORDER — MAGNESIUM SULFATE 2 GM/50ML IV SOLN: 2.0000 g | Freq: Every day | INTRAVENOUS | Status: DC | PRN

## 2021-03-03 MED ORDER — PREGABALIN 100 MG PO CAPS
300.0000 mg | ORAL_CAPSULE | Freq: Two times a day (BID) | ORAL | Status: DC
  Administered 2021-03-03 – 2021-03-05 (×4): 300 mg via ORAL
  Filled 2021-03-03 (×2): qty 3
  Filled 2021-03-03: qty 12
  Filled 2021-03-03 (×2): qty 3

## 2021-03-03 MED ORDER — LABETALOL HCL 5 MG/ML IV SOLN: 10.0000 mg | INTRAVENOUS | Status: DC | PRN

## 2021-03-03 MED ORDER — PANTOPRAZOLE SODIUM 40 MG PO TBEC
40.0000 mg | DELAYED_RELEASE_TABLET | Freq: Every day | ORAL | Status: DC
  Administered 2021-03-04 – 2021-03-05 (×2): 40 mg via ORAL
  Filled 2021-03-03 (×2): qty 1

## 2021-03-03 MED ORDER — ROCURONIUM BROMIDE 10 MG/ML (PF) SYRINGE
PREFILLED_SYRINGE | INTRAVENOUS | Status: DC | PRN
  Administered 2021-03-03: 20 mg via INTRAVENOUS
  Administered 2021-03-03: 70 mg via INTRAVENOUS

## 2021-03-03 MED ORDER — MIDAZOLAM HCL 2 MG/2ML IJ SOLN
INTRAMUSCULAR | Status: AC
  Filled 2021-03-03: qty 2

## 2021-03-03 MED ORDER — CHLORHEXIDINE GLUCONATE CLOTH 2 % EX PADS: 6.0000 | MEDICATED_PAD | Freq: Once | CUTANEOUS | Status: DC

## 2021-03-03 MED ORDER — SODIUM CHLORIDE 0.9 % IV SOLN
INTRAVENOUS | Status: AC
  Filled 2021-03-03: qty 1.2

## 2021-03-03 MED ORDER — FENTANYL CITRATE (PF) 100 MCG/2ML IJ SOLN
INTRAMUSCULAR | Status: AC
  Administered 2021-03-03: 25 ug via INTRAVENOUS
  Filled 2021-03-03: qty 2

## 2021-03-03 MED ORDER — 0.9 % SODIUM CHLORIDE (POUR BTL) OPTIME
TOPICAL | Status: DC | PRN
  Administered 2021-03-03: 2000 mL

## 2021-03-03 MED ORDER — ADULT MULTIVITAMIN W/MINERALS CH
1.0000 | ORAL_TABLET | Freq: Every day | ORAL | Status: DC
  Administered 2021-03-04 – 2021-03-05 (×2): 1 via ORAL
  Filled 2021-03-03 (×2): qty 1

## 2021-03-03 MED ORDER — NITROGLYCERIN 0.4 MG SL SUBL: 0.4000 mg | SUBLINGUAL_TABLET | SUBLINGUAL | Status: DC | PRN

## 2021-03-03 MED ORDER — ONDANSETRON HCL 4 MG/2ML IJ SOLN
INTRAMUSCULAR | Status: DC | PRN
  Administered 2021-03-03: 4 mg via INTRAVENOUS

## 2021-03-03 MED ORDER — TAMSULOSIN HCL 0.4 MG PO CAPS
0.4000 mg | ORAL_CAPSULE | Freq: Every day | ORAL | Status: DC
  Administered 2021-03-03 – 2021-03-04 (×2): 0.4 mg via ORAL
  Filled 2021-03-03 (×2): qty 1

## 2021-03-03 MED ORDER — LEVOTHYROXINE SODIUM 25 MCG PO TABS
25.0000 ug | ORAL_TABLET | Freq: Every day | ORAL | Status: DC
  Administered 2021-03-04 – 2021-03-05 (×2): 25 ug via ORAL
  Filled 2021-03-03 (×2): qty 1

## 2021-03-03 MED ORDER — EPHEDRINE SULFATE-NACL 50-0.9 MG/10ML-% IV SOSY
PREFILLED_SYRINGE | INTRAVENOUS | Status: DC | PRN
  Administered 2021-03-03: 5 mg via INTRAVENOUS
  Administered 2021-03-03: 10 mg via INTRAVENOUS

## 2021-03-03 MED ORDER — PROPOFOL 10 MG/ML IV BOLUS
INTRAVENOUS | Status: DC | PRN
  Administered 2021-03-03: 80 mg via INTRAVENOUS
  Administered 2021-03-03: 100 mg via INTRAVENOUS

## 2021-03-03 MED ORDER — METOPROLOL TARTRATE 25 MG PO TABS
25.0000 mg | ORAL_TABLET | Freq: Two times a day (BID) | ORAL | Status: DC
  Administered 2021-03-03 – 2021-03-05 (×4): 25 mg via ORAL
  Filled 2021-03-03 (×4): qty 1

## 2021-03-03 MED ORDER — ACETAMINOPHEN 500 MG PO TABS
1000.0000 mg | ORAL_TABLET | Freq: Once | ORAL | Status: AC
  Administered 2021-03-03: 1000 mg via ORAL
  Filled 2021-03-03: qty 2

## 2021-03-03 MED ORDER — ONDANSETRON HCL 4 MG/2ML IJ SOLN: 4.0000 mg | Freq: Once | INTRAMUSCULAR | Status: DC | PRN

## 2021-03-03 MED ORDER — SODIUM CHLORIDE 0.9 % IV SOLN: 500.0000 mL | Freq: Once | INTRAVENOUS | Status: DC | PRN

## 2021-03-03 MED ORDER — HEPARIN SODIUM (PORCINE) 1000 UNIT/ML IJ SOLN
INTRAMUSCULAR | Status: DC | PRN
  Administered 2021-03-03: 10000 [IU] via INTRAVENOUS
  Administered 2021-03-03: 2000 [IU] via INTRAVENOUS

## 2021-03-03 MED ORDER — SUGAMMADEX SODIUM 200 MG/2ML IV SOLN
INTRAVENOUS | Status: DC | PRN
  Administered 2021-03-03: 400 mg via INTRAVENOUS

## 2021-03-03 MED ORDER — LIDOCAINE 2% (20 MG/ML) 5 ML SYRINGE
INTRAMUSCULAR | Status: DC | PRN
  Administered 2021-03-03: 100 mg via INTRAVENOUS

## 2021-03-03 MED ORDER — POLYETHYLENE GLYCOL 3350 17 G PO PACK: 17.0000 g | PACK | Freq: Every day | ORAL | Status: DC | PRN

## 2021-03-03 MED ORDER — ACETAMINOPHEN 650 MG RE SUPP
325.0000 mg | RECTAL | Status: DC | PRN
Start: 2021-03-03 — End: 2021-03-05

## 2021-03-03 MED ORDER — ASPIRIN EC 81 MG PO TBEC
81.0000 mg | DELAYED_RELEASE_TABLET | Freq: Every day | ORAL | Status: DC
  Administered 2021-03-04 – 2021-03-05 (×2): 81 mg via ORAL
  Filled 2021-03-03 (×2): qty 1

## 2021-03-03 MED ORDER — DOCUSATE SODIUM 100 MG PO CAPS
100.0000 mg | ORAL_CAPSULE | Freq: Every day | ORAL | Status: DC
  Administered 2021-03-04 – 2021-03-05 (×2): 100 mg via ORAL
  Filled 2021-03-03 (×2): qty 1

## 2021-03-03 MED ORDER — OXYCODONE-ACETAMINOPHEN 5-325 MG PO TABS
1.0000 | ORAL_TABLET | ORAL | Status: DC | PRN
  Administered 2021-03-03 – 2021-03-05 (×8): 2 via ORAL
  Filled 2021-03-03 (×8): qty 2

## 2021-03-03 MED ORDER — EZETIMIBE 10 MG PO TABS
10.0000 mg | ORAL_TABLET | Freq: Every day | ORAL | Status: DC
  Administered 2021-03-04 – 2021-03-05 (×2): 10 mg via ORAL
  Filled 2021-03-03 (×2): qty 1

## 2021-03-03 MED ORDER — DULOXETINE HCL 30 MG PO CPEP
30.0000 mg | ORAL_CAPSULE | Freq: Two times a day (BID) | ORAL | Status: DC
  Administered 2021-03-03 – 2021-03-05 (×4): 30 mg via ORAL
  Filled 2021-03-03 (×4): qty 1

## 2021-03-03 MED ORDER — ALUM & MAG HYDROXIDE-SIMETH 200-200-20 MG/5ML PO SUSP: 15.0000 mL | ORAL | Status: DC | PRN

## 2021-03-03 MED ORDER — FENTANYL CITRATE (PF) 250 MCG/5ML IJ SOLN
INTRAMUSCULAR | Status: AC
  Filled 2021-03-03: qty 5

## 2021-03-03 MED ORDER — CHLORHEXIDINE GLUCONATE 0.12 % MT SOLN
15.0000 mL | Freq: Once | OROMUCOSAL | Status: AC
  Administered 2021-03-03: 15 mL via OROMUCOSAL
  Filled 2021-03-03: qty 15

## 2021-03-03 MED ORDER — POTASSIUM CHLORIDE CRYS ER 20 MEQ PO TBCR: 20.0000 meq | EXTENDED_RELEASE_TABLET | Freq: Every day | ORAL | Status: DC | PRN

## 2021-03-03 MED ORDER — FENTANYL CITRATE (PF) 250 MCG/5ML IJ SOLN
INTRAMUSCULAR | Status: DC | PRN
  Administered 2021-03-03: 100 ug via INTRAVENOUS

## 2021-03-03 MED ORDER — HYDRALAZINE HCL 20 MG/ML IJ SOLN: 5.0000 mg | INTRAMUSCULAR | Status: DC | PRN

## 2021-03-03 MED ORDER — GUAIFENESIN-DM 100-10 MG/5ML PO SYRP: 15.0000 mL | ORAL_SOLUTION | ORAL | Status: DC | PRN

## 2021-03-03 MED ORDER — INSULIN ASPART 100 UNIT/ML ~~LOC~~ SOLN
0.0000 [IU] | Freq: Three times a day (TID) | SUBCUTANEOUS | Status: DC
  Administered 2021-03-03: 3 [IU] via SUBCUTANEOUS
  Administered 2021-03-04 – 2021-03-05 (×2): 2 [IU] via SUBCUTANEOUS

## 2021-03-03 MED ORDER — DEXAMETHASONE SODIUM PHOSPHATE 10 MG/ML IJ SOLN
INTRAMUSCULAR | Status: DC | PRN
  Administered 2021-03-03: 5 mg via INTRAVENOUS

## 2021-03-03 MED ORDER — BISACODYL 10 MG RE SUPP: 10.0000 mg | Freq: Every day | RECTAL | Status: DC | PRN

## 2021-03-03 MED ORDER — ALBUMIN HUMAN 5 % IV SOLN: INTRAVENOUS | Status: DC | PRN

## 2021-03-03 MED ORDER — MORPHINE SULFATE (PF) 2 MG/ML IV SOLN
2.0000 mg | INTRAVENOUS | Status: DC | PRN
  Administered 2021-03-03 (×2): 2 mg via INTRAVENOUS
  Filled 2021-03-03 (×2): qty 1

## 2021-03-03 MED ORDER — ORAL CARE MOUTH RINSE: 15.0000 mL | Freq: Once | OROMUCOSAL | Status: AC

## 2021-03-03 MED ORDER — HEPARIN SODIUM (PORCINE) 5000 UNIT/ML IJ SOLN
5000.0000 [IU] | Freq: Three times a day (TID) | INTRAMUSCULAR | Status: DC
  Administered 2021-03-04 – 2021-03-05 (×4): 5000 [IU] via SUBCUTANEOUS
  Filled 2021-03-03 (×4): qty 1

## 2021-03-03 MED ORDER — PHENYLEPHRINE 40 MCG/ML (10ML) SYRINGE FOR IV PUSH (FOR BLOOD PRESSURE SUPPORT)
PREFILLED_SYRINGE | INTRAVENOUS | Status: DC | PRN
  Administered 2021-03-03: 80 ug via INTRAVENOUS
  Administered 2021-03-03: 40 ug via INTRAVENOUS
  Administered 2021-03-03: 80 ug via INTRAVENOUS

## 2021-03-03 MED ORDER — LISINOPRIL 2.5 MG PO TABS
2.5000 mg | ORAL_TABLET | Freq: Every day | ORAL | Status: DC
  Administered 2021-03-04 – 2021-03-05 (×2): 2.5 mg via ORAL
  Filled 2021-03-03 (×2): qty 1

## 2021-03-03 MED ORDER — POLYVINYL ALCOHOL 1.4 % OP SOLN
1.0000 [drp] | Freq: Four times a day (QID) | OPHTHALMIC | Status: DC
  Administered 2021-03-03 – 2021-03-05 (×5): 1 [drp] via OPHTHALMIC
  Filled 2021-03-03: qty 15

## 2021-03-03 MED ORDER — SODIUM CHLORIDE 0.9 % IV SOLN
INTRAVENOUS | Status: DC | PRN
  Administered 2021-03-03: 500 mL

## 2021-03-03 MED ORDER — SODIUM CHLORIDE 0.9 % IV SOLN: INTRAVENOUS | Status: DC

## 2021-03-03 MED ORDER — ONDANSETRON HCL 4 MG/2ML IJ SOLN: 4.0000 mg | Freq: Four times a day (QID) | INTRAMUSCULAR | Status: DC | PRN

## 2021-03-03 MED ORDER — CEFAZOLIN SODIUM-DEXTROSE 2-4 GM/100ML-% IV SOLN
2.0000 g | INTRAVENOUS | Status: AC
  Administered 2021-03-03: 2 g via INTRAVENOUS
  Filled 2021-03-03: qty 100

## 2021-03-03 MED ORDER — METFORMIN HCL 500 MG PO TABS
500.0000 mg | ORAL_TABLET | Freq: Two times a day (BID) | ORAL | Status: DC
  Administered 2021-03-03 – 2021-03-05 (×4): 500 mg via ORAL
  Filled 2021-03-03 (×4): qty 1

## 2021-03-03 MED ORDER — OMEGA-3-ACID ETHYL ESTERS 1 G PO CAPS
1.0000 g | ORAL_CAPSULE | Freq: Every day | ORAL | Status: DC
  Administered 2021-03-04 – 2021-03-05 (×2): 1 g via ORAL
  Filled 2021-03-03 (×2): qty 1

## 2021-03-03 MED ORDER — MIDAZOLAM HCL 2 MG/2ML IJ SOLN
INTRAMUSCULAR | Status: DC | PRN
  Administered 2021-03-03: 2 mg via INTRAVENOUS

## 2021-03-03 MED ORDER — CEFAZOLIN SODIUM-DEXTROSE 2-4 GM/100ML-% IV SOLN
2.0000 g | Freq: Three times a day (TID) | INTRAVENOUS | Status: AC
  Administered 2021-03-03 – 2021-03-04 (×2): 2 g via INTRAVENOUS
  Filled 2021-03-03 (×2): qty 100

## 2021-03-03 MED ORDER — ATORVASTATIN CALCIUM 40 MG PO TABS
40.0000 mg | ORAL_TABLET | Freq: Every day | ORAL | Status: DC
  Administered 2021-03-03 – 2021-03-04 (×2): 40 mg via ORAL
  Filled 2021-03-03 (×2): qty 1

## 2021-03-03 MED ORDER — LACTATED RINGERS IV SOLN: INTRAVENOUS | Status: DC

## 2021-03-03 SURGICAL SUPPLY — 44 items
APL PRP STRL LF DISP 70% ISPRP (MISCELLANEOUS) ×2
APL SKNCLS STERI-STRIP NONHPOA (GAUZE/BANDAGES/DRESSINGS) ×1
BENZOIN TINCTURE PRP APPL 2/3 (GAUZE/BANDAGES/DRESSINGS) ×3 IMPLANT
CANISTER SUCT 3000ML PPV (MISCELLANEOUS) ×3 IMPLANT
CANNULA VESSEL 3MM 2 BLNT TIP (CANNULA) ×6 IMPLANT
CHLORAPREP W/TINT 26 (MISCELLANEOUS) ×5 IMPLANT
CLIP VESOCCLUDE MED 24/CT (CLIP) ×3 IMPLANT
CLIP VESOCCLUDE SM WIDE 24/CT (CLIP) ×3 IMPLANT
CLOSURE STERI-STRIP 1/2X4 (GAUZE/BANDAGES/DRESSINGS) ×1
CLOSURE WOUND 1/2 X4 (GAUZE/BANDAGES/DRESSINGS) ×1
CLSR STERI-STRIP ANTIMIC 1/2X4 (GAUZE/BANDAGES/DRESSINGS) ×1 IMPLANT
COVER PROBE W GEL 5X96 (DRAPES) ×3 IMPLANT
COVER WAND RF STERILE (DRAPES) ×1 IMPLANT
DRAIN CHANNEL 15F RND FF W/TCR (WOUND CARE) IMPLANT
DRSG COVADERM 4X8 (GAUZE/BANDAGES/DRESSINGS) ×3 IMPLANT
ELECT REM PT RETURN 9FT ADLT (ELECTROSURGICAL) ×3
ELECTRODE REM PT RTRN 9FT ADLT (ELECTROSURGICAL) ×1 IMPLANT
EVACUATOR SILICONE 100CC (DRAIN) IMPLANT
GAUZE SPONGE 4X4 12PLY STRL (GAUZE/BANDAGES/DRESSINGS) ×3 IMPLANT
GLOVE SURG SS PI 8.0 STRL IVOR (GLOVE) ×3 IMPLANT
GOWN STRL REUS W/ TWL LRG LVL3 (GOWN DISPOSABLE) ×2 IMPLANT
GOWN STRL REUS W/ TWL XL LVL3 (GOWN DISPOSABLE) ×1 IMPLANT
GOWN STRL REUS W/TWL LRG LVL3 (GOWN DISPOSABLE) ×6
GOWN STRL REUS W/TWL XL LVL3 (GOWN DISPOSABLE) ×3
KIT BASIN OR (CUSTOM PROCEDURE TRAY) ×3 IMPLANT
KIT TURNOVER KIT B (KITS) ×3 IMPLANT
NS IRRIG 1000ML POUR BTL (IV SOLUTION) ×6 IMPLANT
PACK PERIPHERAL VASCULAR (CUSTOM PROCEDURE TRAY) ×3 IMPLANT
PAD ARMBOARD 7.5X6 YLW CONV (MISCELLANEOUS) ×6 IMPLANT
PATCH VASC XENOSURE 1CMX6CM (Vascular Products) ×3 IMPLANT
PATCH VASC XENOSURE 1X6 (Vascular Products) ×1 IMPLANT
STRIP CLOSURE SKIN 1/2X4 (GAUZE/BANDAGES/DRESSINGS) ×2 IMPLANT
SUT ETHILON 3 0 PS 1 (SUTURE) IMPLANT
SUT MNCRL AB 4-0 PS2 18 (SUTURE) ×3 IMPLANT
SUT PROLENE 5 0 C 1 24 (SUTURE) ×9 IMPLANT
SUT PROLENE 6 0 BV (SUTURE) ×3 IMPLANT
SUT VIC AB 2-0 CT1 27 (SUTURE) ×9
SUT VIC AB 2-0 CT1 TAPERPNT 27 (SUTURE) ×1 IMPLANT
SUT VIC AB 3-0 SH 27 (SUTURE) ×6
SUT VIC AB 3-0 SH 27X BRD (SUTURE) ×2 IMPLANT
TOWEL GREEN STERILE (TOWEL DISPOSABLE) ×6 IMPLANT
TOWEL GREEN STERILE FF (TOWEL DISPOSABLE) ×3 IMPLANT
UNDERPAD 30X36 HEAVY ABSORB (UNDERPADS AND DIAPERS) ×3 IMPLANT
WATER STERILE IRR 1000ML POUR (IV SOLUTION) ×3 IMPLANT

## 2021-03-03 NOTE — Progress Notes (Signed)
  Day of Surgery Note    Subjective:  Resting comfortably   Vitals:   03/03/21 0619 03/03/21 1020  BP:  124/74  Pulse:  79  Resp:  16  Temp: 98.4 F (36.9 C)   SpO2:  94%    Incisions:   Right groin with bandage in place and is clean and dry Extremities:  +right DP/PT doppler signals right foot Cardiac:  regular Lungs:  Non labored    Assessment/Plan:  This is a 65 y.o. male who is s/p  Right common femoral endarterectomy and bovine pericardial patch angioplasty  -pt with +right DP/PT doppler signals right foot -right groin with bandage in place and is clean and dry -to 4 east later today -asa ordered -SSI also ordered   Leontine Locket, PA-C 03/03/2021 10:48 AM [phone]

## 2021-03-03 NOTE — Op Note (Addendum)
DATE OF SERVICE: 03/03/2021  PATIENT:  Steven Hale.  65 y.o. male  PRE-OPERATIVE DIAGNOSIS:  Atherosclerosis of native arteries of right lower extremity causing disabling claudication  POST-OPERATIVE DIAGNOSIS:  Same  PROCEDURE:   Right common femoral endarterectomy and bovine pericardial patch angioplasty  SURGEON:  Surgeon(s) and Role:    * Cherre Robins, MD - Primary  ASSISTANT: Leontine Locket, PA-C  An assistant was required to facilitate exposure and expedite the case.  ANESTHESIA:   general  EBL: 184mL  BLOOD ADMINISTERED:none  DRAINS: none   LOCAL MEDICATIONS USED:  NONE  SPECIMEN:  none  COUNTS: confirmed correct.  TOURNIQUET:  None  PATIENT DISPOSITION:  PACU - hemodynamically stable.   Delay start of Pharmacological VTE agent (>24hrs) due to surgical blood loss or risk of bleeding: no  INDICATION FOR PROCEDURE: Steven Hale. is a 65 y.o. male with disabling right lower extremity claudication. After careful discussion of risks, benefits, and alternatives the patient was offered right femoral endarterectomy. The patient understood and wished to proceed.  OPERATIVE FINDINGS: Inflamed common femoral artery and femoral bifurcation.  Calcified, exophytic, near occlusive plaque in the common femoral artery.  Successful endarterectomy and patch angioplasty.  Excellent Doppler flow in the superficial femoral and profunda femoris artery after procedure.  DESCRIPTION OF PROCEDURE: After identification of the patient in the pre-operative holding area, the patient was transferred to the operating room. The patient was positioned supine on the operating room table. Anesthesia was induced. The right abdomen, groin, thigh were prepped and draped in standard fashion. A surgical pause was performed confirming correct patient, procedure, and operative location.  The course of the right common femoral artery and femoral bifurcation were mapped using intraoperative  ultrasound.  An oblique incision was made over the groin to avoid inguinal creases.  Incision was made and carried down through the subcutaneous tissue until the femoral sheath was encountered.  This was divided sharply.  The femoral vessels were exposed sharply.  The  proximal superficial femoral artery, and proximal superficial femoral artery were encircled with Silastic Vesseloops.  A soft area of right external iliac was identified and a Henley clamp was slotted in place.  The patient was heparinized with 13,000 units of IV heparin.  ACT monitoring was used throughout the case to confirm adequate anticoagulation.  Tootle arteriotomy was made with 11 blade and extended with Potts scissors on the common femoral artery.  I did have to extend the arteriotomy onto the origin of the profunda femoris artery to allow adequate endarterectomy.  An endarterectomy plane was established with a Soil scientist and the entire plaque removed.  The surgical bed was copiously irrigated with heparinized saline and any loose debris was removed.  I confirmed adequate backbleeding from the profunda femoris artery and torrential inflow from the external iliac artery.  A 1 x 6 cm bovine pericardial patch was brought to the field and narrowed sharply.  This was sewn as a patch angioplasty to the arteriotomy using continuous running suture of 5 oh C1 Prolene.  Immediately prior to completion the patch was flushed and de-aired.  The patch repair was completed.  Clamps were released sequentially to allow potential emboli to travel down sidebranches.  I then released pressure on the profunda femoris artery and the superficial femoral artery.  The repair was evaluated with a Doppler machine.  There is excellent flow proximally.  There was continuous diastolic flow in the profunda femoris and a normal, triphasic superficial femoral artery waveform.  Heparin was reversed with protamine.  Hemostasis was confirmed in the patch.  Several repair  stitches were required.  Hemostasis was confirmed in the wound bed.  The wound was closed in layers using 2-0 Vicryl, 3-0 Vicryl, 4-0 Monocryl.  Steri-Strips and sterile bandages were applied.  Upon completion of the case instrument and sharps counts were confirmed correct. The patient was transferred to the PACU in good condition. I was present for all portions of the procedure.  Steven Hale. Steven Breed, MD [facility] Phone Number: [phone] 03/03/2021 10:03 AM

## 2021-03-03 NOTE — Discharge Instructions (Signed)
 [facility] [place]  Discharge instructions  Lower Extremity Bypass Surgery  Please refer to the following instruction for your post-procedure care. Your surgeon or physician assistant will discuss any changes with you.  Activity  You are encouraged to walk as much as you can. You can slowly return to normal activities during the month after your surgery. Avoid strenuous activity and heavy lifting until your doctor tells you it's OK. Avoid activities such as vacuuming or swinging a golf club. Do not drive until your doctor give the OK and you are no longer taking prescription pain medications. It is also normal to have difficulty with sleep habits, eating and bowel movement after surgery. These will go away with time.  Bathing/Showering  Shower daily after you go home. Do not soak in a bathtub, hot tub, or swim until the incision heals completely.  Incision Care  Clean your incision with mild soap and water. Shower every day. Pat the area dry with a clean towel. You do not need a bandage unless otherwise instructed. Do not apply any ointments or creams to your incision. If you have open wounds you will be instructed how to care for them or a visiting nurse may be arranged for you. If you have staples or sutures along your incision they will be removed at your post-op appointment. You may have skin glue on your incision. Do not peel it off. It will come off on its own in about one week.  Wash the groin wound with soap and water daily and pat dry. (No tub bath-only shower)  Then put a dry gauze or washcloth in the groin to keep this area dry to help prevent wound infection.  Do this daily and as needed.  Do not use Vaseline or neosporin on your incisions.  Only use soap and water on your incisions and then protect and keep dry.  Diet  Resume your normal diet. There are no special food restrictions following this procedure. A low fat/ low cholesterol diet is  recommended for all patients with vascular disease. In order to heal from your surgery, it is CRITICAL to get adequate nutrition. Your body requires vitamins, minerals, and protein. Vegetables are the best source of vitamins and minerals. Vegetables also provide the perfect balance of protein. Processed food has little nutritional value, so try to avoid this.  Medications  Resume taking all your medications unless your doctor or physician assistant tells you not to. If your incision is causing pain, you may take over-the-counter pain relievers such as acetaminophen (Tylenol). If you were prescribed a stronger pain medication, please aware these medication can cause nausea and constipation. Prevent nausea by taking the medication with a snack or meal. Avoid constipation by drinking plenty of fluids and eating foods with high amount of fiber, such as fruits, vegetables, and grains. Take Colace 100 mg (an over-the-counter stool softener) twice a day as needed for constipation.  Do not take Tylenol if you are taking prescription pain medications.  Follow Up  Our office will schedule a follow up appointment 2-3 weeks following discharge.  Please call us immediately for any of the following conditions  Severe or worsening pain in your legs or feet while at rest or while walking Increase pain, redness, warmth, or drainage (pus) from your incision site(s) Fever of 101 degree or higher The swelling in your leg with the bypass suddenly worsens and becomes more painful than when you were in the hospital If you have   been instructed to feel your graft pulse then you should do so every day. If you can no longer feel this pulse, call the office immediately. Not all patients are given this instruction.  Leg swelling is common after leg bypass surgery.  The swelling should improve over a few months following surgery. To improve the swelling, you may elevate your legs above the level of your heart while you are  sitting or resting. Your surgeon or physician assistant may ask you to apply an ACE wrap or wear compression (TED) stockings to help to reduce swelling.  Reduce your risk of vascular disease  Stop smoking. If you would like help call [facility] at 1-800-QUIT-NOW (1-800-784-8669) or [place] at [phone].  Manage your cholesterol Maintain a desired weight Control your diabetes weight Control your diabetes Keep your blood pressure down  If you have any questions, please call the office at [phone]  

## 2021-03-03 NOTE — Interval H&P Note (Signed)
History and Physical Interval Note:  03/03/2021 7:15 AM  Steven Hale.  has presented today for surgery, with the diagnosis of PAD.  The various methods of treatment have been discussed with the patient and family. After consideration of risks, benefits and other options for treatment, the patient has consented to  Procedure(s): RIGHT FEMORAL ENDARTERECTOMY (Right) as a surgical intervention.  The patient's history has been reviewed, patient examined, no change in status, stable for surgery.  I have reviewed the patient's chart and labs.  Questions were answered to the patient's satisfaction.     Cherre Robins

## 2021-03-03 NOTE — Anesthesia Procedure Notes (Signed)
Arterial Line Insertion Start/End4/18/2022 7:05 AM, 03/03/2021 7:15 AM Performed by: Reece Agar, CRNA, CRNA  Patient location: Pre-op. Preanesthetic checklist: patient identified, IV checked, site marked, risks and benefits discussed, surgical consent, monitors and equipment checked, pre-op evaluation, timeout performed and anesthesia consent Lidocaine 1% used for infiltration Left, radial was placed Catheter size: 20 G Hand hygiene performed , maximum sterile barriers used  and Seldinger technique used Allen's test indicative of satisfactory collateral circulation Attempts: 2 Procedure performed without using ultrasound guided technique. Following insertion, dressing applied and Biopatch. Post procedure assessment: normal and unchanged  Patient tolerated the procedure well with no immediate complications.

## 2021-03-03 NOTE — Transfer of Care (Signed)
Immediate Anesthesia Transfer of Care Note  Patient: Steven Hale.  Procedure(s) Performed: RIGHT FEMORAL ENDARTERECTOMY (Right Groin) PATCH ANGIOPLASTY (Right Groin)  Patient Location: PACU  Anesthesia Type:General  Level of Consciousness: drowsy  Airway & Oxygen Therapy: Patient connected to face mask oxygen  Post-op Assessment: Report given to RN and Post -op Vital signs reviewed and stable  Post vital signs: Reviewed and stable  Last Vitals:  Vitals Value Taken Time  BP 124/74 03/03/21 1020  Temp    Pulse 80 03/03/21 1023  Resp 21 03/03/21 1023  SpO2 95 % 03/03/21 1023  Vitals shown include unvalidated device data.  Last Pain:  Vitals:   03/03/21 0705  PainSc: 0-No pain         Complications: No complications documented.

## 2021-03-03 NOTE — Anesthesia Procedure Notes (Signed)
Procedure Name: Intubation Date/Time: 03/03/2021 7:46 AM Performed by: Reece Agar, CRNA Pre-anesthesia Checklist: Patient identified, Emergency Drugs available, Suction available and Patient being monitored Patient Re-evaluated:Patient Re-evaluated prior to induction Oxygen Delivery Method: Circle System Utilized Preoxygenation: Pre-oxygenation with 100% oxygen Induction Type: IV induction Ventilation: Mask ventilation without difficulty and Oral airway inserted - appropriate to patient size Laryngoscope Size: Mac and 4 Grade View: Grade II Tube type: Oral Tube size: 7.5 mm Number of attempts: 1 Airway Equipment and Method: Stylet and Oral airway Placement Confirmation: ETT inserted through vocal cords under direct vision,  positive ETCO2 and breath sounds checked- equal and bilateral Secured at: 23 cm Tube secured with: Tape Dental Injury: Teeth and Oropharynx as per pre-operative assessment

## 2021-03-03 NOTE — Anesthesia Postprocedure Evaluation (Signed)
Anesthesia Post Note  Patient: Steven Hale.  Procedure(s) Performed: RIGHT FEMORAL ENDARTERECTOMY (Right Groin) PATCH ANGIOPLASTY (Right Groin)     Patient location during evaluation: PACU Anesthesia Type: General Level of consciousness: awake and alert Pain management: pain level controlled Vital Signs Assessment: post-procedure vital signs reviewed and stable Respiratory status: spontaneous breathing, nonlabored ventilation, respiratory function stable and patient connected to nasal cannula oxygen Cardiovascular status: blood pressure returned to baseline and stable Postop Assessment: no apparent nausea or vomiting Anesthetic complications: no   No complications documented.  Last Vitals:  Vitals:   03/03/21 1214 03/03/21 1227  BP: 109/76 109/76  Pulse: 67   Resp: 16   Temp: 36.6 C 36.6 C  SpO2: 98%     Last Pain:  Vitals:   03/03/21 1528  TempSrc:   PainSc: 3                  Catalina Gravel

## 2021-03-04 ENCOUNTER — Encounter (HOSPITAL_COMMUNITY): Payer: Self-pay | Admitting: Vascular Surgery

## 2021-03-04 LAB — GLUCOSE, CAPILLARY
Glucose-Capillary: 166 mg/dL — ABNORMAL HIGH (ref 70–99)
Glucose-Capillary: 129 mg/dL — ABNORMAL HIGH (ref 70–99)
Glucose-Capillary: 114 mg/dL — ABNORMAL HIGH (ref 70–99)
Glucose-Capillary: 92 mg/dL (ref 70–99)

## 2021-03-04 LAB — LIPID PANEL
Cholesterol: 104 mg/dL (ref 0–200)
HDL: 36 mg/dL — ABNORMAL LOW (ref >40)
LDL Cholesterol: 51 mg/dL (ref 0–99)
Total CHOL/HDL Ratio: 2.9 RATIO
Triglycerides: 87 mg/dL (ref <150)
VLDL: 17 mg/dL (ref 0–40)

## 2021-03-04 LAB — CBC
HCT: 31.8 % — ABNORMAL LOW (ref 39.0–52.0)
Hemoglobin: 10.4 g/dL — ABNORMAL LOW (ref 13.0–17.0)
MCH: 29.9 pg (ref 26.0–34.0)
MCHC: 32.7 g/dL (ref 30.0–36.0)
MCV: 91.4 fL (ref 80.0–100.0)
Platelets: 326 10*3/uL (ref 150–400)
RBC: 3.48 MIL/uL — ABNORMAL LOW (ref 4.22–5.81)
RDW: 14.6 % (ref 11.5–15.5)
WBC: 10.6 10*3/uL — ABNORMAL HIGH (ref 4.0–10.5)
nRBC: 0 % (ref 0.0–0.2)

## 2021-03-04 LAB — BASIC METABOLIC PANEL
Anion gap: 7 (ref 5–15)
BUN: 12 mg/dL (ref 8–23)
CO2: 29 mmol/L (ref 22–32)
Calcium: 9 mg/dL (ref 8.9–10.3)
Chloride: 102 mmol/L (ref 98–111)
Creatinine, Ser: 0.96 mg/dL (ref 0.61–1.24)
GFR, Estimated: >60 mL/min (ref >60)
Glucose, Bld: 128 mg/dL — ABNORMAL HIGH (ref 70–99)
Potassium: 4.3 mmol/L (ref 3.5–5.1)
Sodium: 138 mmol/L (ref 135–145)

## 2021-03-04 NOTE — Evaluation (Signed)
Physical Therapy Evaluation Patient Details Name: Steven Hale. MRN: [id number] DOB: Jul 28, 1956 Today's Date: 03/04/2021   History of Present Illness  Pt. is a 65 y.o. male admitted 4/18 following referral to clinic for evaluation after angiogram demonstrating severe right common femoral artery atherosclerotic plaque.  The patient reports bilateral calf claudication which has become disabling.  He is a remote smoker, having quit in 2009.  He is compliant with best medical therapy for peripheral arterial disease.  Complicating things is a history of chronic back pain with neurogenic claudication. pt with Right common femoral endarterectomy and bovine pericardial patch angioplasty on 4/18  Clinical Impression  Pt fully participated in evaluation but was limited in gait distance by pain. Pt was I prior to admission with intermittent need for cane due to back or LE pain. Pt states he has someone who can help out upon discharge, but does have to go up 12 steps to get to his apt. Pt requiring increased cueing for technique to improve bed moiblity and decrease pain and safety with use of RW. Pt will benefit from skilled PT toa ddress deficits in balance, strength, coordination, gait, endurance, safety and pain management to maximize independence with functional mobility prior to discharge.     Follow Up Recommendations Home health PT    Equipment Recommendations  Rolling walker with 5" wheels    Recommendations for Other Services       Precautions / Restrictions Precautions Precautions: None Restrictions Weight Bearing Restrictions: No      Mobility  Bed Mobility Overal bed mobility: Needs Assistance Bed Mobility: Supine to Sit;Sit to Supine     Supine to sit: Min guard Sit to supine: Min guard   General bed mobility comments: use of bed features. Pt transfers from supine>sidelying to modified sit to stand wtihout stopping in the sitting position due to pain    Transfers Overall  transfer level: Needs assistance Equipment used: Rolling walker (2 wheeled) Transfers: Sit to/from Stand Sit to Stand: Min guard            Ambulation/Gait Ambulation/Gait assistance: Min guard Gait Distance (Feet): 20 Feet Assistive device: Rolling walker (2 wheeled) Gait Pattern/deviations: Step-to pattern;Trunk flexed;Decreased step length - left;Decreased step length - right Gait velocity: decreased      Stairs            Wheelchair Mobility    Modified Rankin (Stroke Patients Only)       Balance Overall balance assessment: Needs assistance Sitting-balance support: No upper extremity supported;Feet supported Sitting balance-Leahy Scale: Fair     Standing balance support: No upper extremity supported Standing balance-Leahy Scale: Fair Standing balance comment: able to maintain static standing, requiring UE support for dynamic                             Pertinent Vitals/Pain Pain Assessment: 0-10 Pain Score: 4  Pain Location: R groin    Home Living Family/patient expects to be discharged to:: Private residence Living Arrangements: Alone Available Help at Discharge: Friend(s);Available PRN/intermittently Type of Home: Apartment Home Access: Stairs to enter Entrance Stairs-Rails: Left Entrance Stairs-Number of Steps: 6 and 6 Home Layout: One level Home Equipment: Cane - single point;Crutches      Prior Function Level of Independence: Independent         Comments: ocassionally use the cane     Hand Dominance   Dominant Hand: Right    Extremity/Trunk Assessment   Upper  Extremity Assessment Upper Extremity Assessment: Defer to OT evaluation    Lower Extremity Assessment Lower Extremity Assessment: Generalized weakness;RLE deficits/detail RLE Deficits / Details: pain limiting ROM       Communication   Communication: No difficulties  Cognition Arousal/Alertness: Awake/alert Behavior During Therapy: WFL for tasks  assessed/performed Overall Cognitive Status: Within Functional Limits for tasks assessed                                        General Comments General comments (skin integrity, edema, etc.): VSS on RA. education on how to perform stairs. education on OOB activities with RN x 2 more trials during day    Exercises     Assessment/Plan    PT Assessment Patient needs continued PT services  PT Problem List Decreased strength;Decreased mobility;Decreased range of motion;Decreased coordination;Decreased activity tolerance;Pain;Decreased knowledge of use of DME;Decreased balance       PT Treatment Interventions DME instruction;Therapeutic exercise;Gait training;Balance training;Stair training;Functional mobility training;Therapeutic activities;Patient/family education    PT Goals (Current goals can be found in the Care Plan section)  Acute Rehab PT Goals Patient Stated Goal: to get home PT Goal Formulation: With patient Time For Goal Achievement: 03/18/21 Potential to Achieve Goals: Good    Frequency Min 3X/week   Barriers to discharge        Co-evaluation               AM-PAC PT "6 Clicks" Mobility  Outcome Measure Help needed turning from your back to your side while in a flat bed without using bedrails?: None Help needed moving from lying on your back to sitting on the side of a flat bed without using bedrails?: A Little Help needed moving to and from a bed to a chair (including a wheelchair)?: A Little Help needed standing up from a chair using your arms (e.g., wheelchair or bedside chair)?: A Little Help needed to walk in hospital room?: A Little Help needed climbing 3-5 steps with a railing? : A Lot 6 Click Score: 18    End of Session Equipment Utilized During Treatment: Gait belt Activity Tolerance: Patient limited by pain Patient left: in bed;with bed alarm set;with call bell/phone within reach;with nursing/sitter in room Nurse Communication:  Mobility status;Patient requests pain meds PT Visit Diagnosis: Unsteadiness on feet (R26.81);Other abnormalities of gait and mobility (R26.89);Muscle weakness (generalized) (M62.81);Pain Pain - Right/Left: Right Pain - part of body: Hip    Time: 0600-4599 PT Time Calculation (min) (ACUTE ONLY): 29 min   Charges:   PT Evaluation $PT Eval Low Complexity: 1 Low PT Treatments $Therapeutic Activity: 8-22 mins        Lyanne Co, DPT Acute Rehabilitation Services [phone]  [place] 03/04/2021, 11:42 AM

## 2021-03-04 NOTE — TOC Transition Note (Signed)
Transition of Care (TOC) - CM/SW Discharge Note Marvetta Gibbons RN, BSN Transitions of Care Unit 4E- RN Case Manager See Treatment Team for direct phone #    Patient Details  Name: Steven Hale. MRN: [id number] Date of Birth: 1955-11-28  Transition of Care [facility]) CM/SW Contact:  Dawayne Patricia, RN Phone Number: 03/04/2021, 3:50 PM   Clinical Narrative:    Pt from home s/p Right common femoral endarterectomy, orders have been placed for [facility] and DME needs. Notification received from [facility] that they have a pre-op referral from Vascular office for any [facility] needs. They will f/u with pt post discharge for start of care.  [employer] has been called for RW need- DME- RW to be delivered to room prior ot discharge.    Final next level of care: [place] Barriers to Discharge: No Barriers Identified   Patient Goals and CMS Choice Patient states their goals for this hospitalization and ongoing recovery are:: return home CMS Medicare.gov Compare Post Acute Care list provided to:: Patient    Discharge Placement               Home with [facility]        Discharge Plan and Services   Discharge Planning Services: CM Consult Post Acute Care Choice: [place]          DME Arranged: Gilford Rile rolling DME Agency: [facility] Date DME Agency Contacted: 03/04/21 Time DME Agency Contacted: [id number] Representative spoke with at DME Agency: Freda Munro HH Arranged: PT [place]: [facility] [place] Date [place]: 03/04/21 Time [place]: [place] Representative spoke with at [place]: [place] ([place]) Interventions     Readmission Risk Interventions Readmission Risk Prevention Plan 03/04/2021  Post Dischage Appt Complete  Medication Screening Complete  Transportation Screening Complete  Some recent data might be hidden

## 2021-03-04 NOTE — Evaluation (Signed)
Occupational Therapy Evaluation and Discharge Patient Details Name: Steven Hale. MRN: [id number] DOB: [birth year] Today's Date: 03/04/2021    History of Present Illness Pt. is a 65 y.o. male admitted 4/18 following referral to clinic for evaluation after angiogram demonstrating severe right common femoral artery atherosclerotic plaque.  The patient reports bilateral calf claudication which has become disabling.  He is a remote smoker, having quit in 2009.  He is compliant with best medical therapy for peripheral arterial disease.  Complicating things is a history of chronic back pain with neurogenic claudication. pt with Right common femoral endarterectomy and bovine pericardial patch angioplasty on 4/18   Clinical Impression   Pt supervised for sit to stand, but able to ambulate with light walker use around room modified independently. He is not able to reach his R foot for ADL, but has AE at home and is knowledgeable in use. Pt has all necessary DME from the [place]. No further OT needs.     Follow Up Recommendations  No OT follow up    Equipment Recommendations  None recommended by OT    Recommendations for Other Services       Precautions / Restrictions Precautions Precautions: None Restrictions Weight Bearing Restrictions: No      Mobility Bed Mobility Overal bed mobility: Modified Independent Bed Mobility: Supine to Sit;Sit to Supine     Supine to sit: Min guard Sit to supine: Min guard   General bed mobility comments: HOB up    Transfers Overall transfer level: Needs assistance Equipment used: Rolling walker (2 wheeled) Transfers: Sit to/from Stand Sit to Stand: Supervision         General transfer comment: decreased control of descent    Balance Overall balance assessment: Needs assistance Sitting-balance support: No upper extremity supported;Feet supported Sitting balance-Leahy Scale: Good     Standing balance support: No upper extremity  supported Standing balance-Leahy Scale: Fair Standing balance comment: able to maintain static standing, requiring UE support for dynamic                           ADL either performed or assessed with clinical judgement   ADL Overall ADL's : Modified independent                                       General ADL Comments: pt has AE for LB ADL and recalls how to use     Vision Patient Visual Report: No change from baseline       Perception     Praxis      Pertinent Vitals/Pain Pain Assessment: 0-10 Pain Score: 3  Pain Location: R groin Pain Descriptors / Indicators: Burning Pain Intervention(s): Monitored during session;Premedicated before session;Repositioned     Hand Dominance Right   Extremity/Trunk Assessment Upper Extremity Assessment Upper Extremity Assessment: Overall WFL for tasks assessed   Lower Extremity Assessment Lower Extremity Assessment: Defer to PT evaluation RLE Deficits / Details: pain limiting ROM   Cervical / Trunk Assessment Cervical / Trunk Assessment: Normal   Communication Communication Communication: No difficulties   Cognition Arousal/Alertness: Awake/alert Behavior During Therapy: WFL for tasks assessed/performed Overall Cognitive Status: Within Functional Limits for tasks assessed  General Comments  VSS on RA. education on how to perform stairs. education on OOB activities with RN x 2 more trials during day    Exercises     Shoulder Instructions      Home Living Family/patient expects to be discharged to:: Private residence Living Arrangements: Alone Available Help at Discharge: Friend(s);Available PRN/intermittently Type of Home: Apartment Home Access: Stairs to enter Entrance Stairs-Number of Steps: 6 and 6 Entrance Stairs-Rails: Left Home Layout: One level     Bathroom Shower/Tub: Tub/shower unit;Walk-in Psychologist, prison and probation services:  Standard     Home Equipment: Research scientist (life sciences);Wheelchair - [employer] - 2 wheels;Shower seat;Grab bars - tub/shower;Hand held shower head;Cane - single point Adaptive Equipment: Reacher;Sock aid;Long-handled shoe horn        Prior Functioning/Environment Level of Independence: Independent        Comments: ocassionally use the cane        OT Problem List:        OT Treatment/Interventions:      OT Goals(Current goals can be found in the care plan section) Acute Rehab OT Goals Patient Stated Goal: to get home  OT Frequency:     Barriers to D/C:            Co-evaluation              AM-PAC OT "6 Clicks" Daily Activity     Outcome Measure Help from another person eating meals?: None Help from another person taking care of personal grooming?: None Help from another person toileting, which includes using toliet, bedpan, or urinal?: None Help from another person bathing (including washing, rinsing, drying)?: None Help from another person to put on and taking off regular upper body clothing?: None Help from another person to put on and taking off regular lower body clothing?: None 6 Click Score: 24   End of Session Equipment Utilized During Treatment: Rolling walker Nurse Communication: Patient requests pain meds;Mobility status  Activity Tolerance: Patient tolerated treatment well Patient left: in chair;with call bell/phone within reach  OT Visit Diagnosis: Pain;Other abnormalities of gait and mobility (R26.89)                Time: 6578-4696 OT Time Calculation (min): 25 min Charges:  OT General Charges $OT Visit: 1 Visit OT Evaluation $OT Eval Low Complexity: 1 Low OT Treatments $Self Care/Home Management : 8-22 mins  Nestor Lewandowsky, OTR/L Acute Rehabilitation Services Pager: [phone] Office: [phone]  [place] So 03/04/2021, 3:12 PM

## 2021-03-04 NOTE — Progress Notes (Signed)
PHARMACIST LIPID MONITORING   Steven Hale. is a 65 y.o. male admitted on 03/03/2021 with PVD.  Pharmacy has been consulted to optimize lipid-lowering therapy with the indication of secondary prevention for clinical ASCVD.  Recent Labs:  Lipid Panel (last 6 months):   Lab Results  Component Value Date   CHOL 104 03/04/2021   TRIG 87 03/04/2021   HDL 36 (L) 03/04/2021   CHOLHDL 2.9 03/04/2021   VLDL 17 03/04/2021   LDLCALC 51 03/04/2021    Hepatic function panel (last 6 months):   Lab Results  Component Value Date   AST 25 03/03/2021   ALT 23 03/03/2021   ALKPHOS 69 03/03/2021   BILITOT 0.5 03/03/2021    SCr (since admission):   Serum creatinine: 0.96 mg/dL 03/04/21 0535 Estimated creatinine clearance: 93.1 mL/min  Current therapy and lipid therapy tolerance Current lipid-lowering therapy: atorvastatin 40mg  daily Previous lipid-lowering therapies (if applicable): none Documented or reported allergies or intolerances to lipid-lowering therapies (if applicable):none   Plan:    1.Statin intensity (high intensity recommended for all patients regardless of the LDL):  No statin changes. The patient is already on a high intensity statin.  2.Add ezetimibe (if any one of the following):   Not indicated at this time.  3.Refer to lipid clinic:   No  4.Follow-up with:  Primary care provider - [name], MD  5.Follow-up labs after discharge:  Changes in lipid therapy were made. Check a lipid panel in 8-12 weeks then annually.     Hildred Laser, PharmD Clinical Pharmacist **Pharmacist phone directory can now be found on [email or web address] ([email or web address]).  Listed under [place].

## 2021-03-04 NOTE — Progress Notes (Addendum)
Progress Note    03/04/2021 7:34 AM 1 Day Post-Op  Subjective:  No complaints; hasn't gotten up yet  Afebrile HR 60's-80's  818'H-631'S systolic 97% RA  Vitals:   03/03/21 2335 03/04/21 0357  BP: (!) 109/55 117/62  Pulse: 67 77  Resp: 11 13  Temp: 97.9 F (36.6 C) 97.8 F (36.6 C)  SpO2: 97% 97%    Physical Exam: Cardiac:  regular Lungs:  Non labored  Incisions:  Clean with steri strips in place Extremities:  Brisk right DP/PT doppler signals   CBC    Component Value Date/Time   WBC 10.6 (H) 03/04/2021 0535   RBC 3.48 (L) 03/04/2021 0535   HGB 10.4 (L) 03/04/2021 0535   HGB 12.9 (L) 02/04/2021 1150   HGB 12.4 (L) 09/08/2010 1059   HCT 31.8 (L) 03/04/2021 0535   HCT 38.8 02/04/2021 1150   HCT 36.7 (L) 09/08/2010 1059   PLT 326 03/04/2021 0535   PLT 284 02/04/2021 1150   MCV 91.4 03/04/2021 0535   MCV 87 02/04/2021 1150   MCV 88.6 09/08/2010 1059   MCH 29.9 03/04/2021 0535   MCHC 32.7 03/04/2021 0535   RDW 14.6 03/04/2021 0535   RDW 13.7 02/04/2021 1150   RDW 17.1 (H) 09/08/2010 1059   LYMPHSABS 2.9 03/21/2019 1009   LYMPHSABS 2.7 09/08/2010 1059   MONOABS 0.2 10/15/2014 1334   MONOABS 0.6 09/08/2010 1059   EOSABS 0.2 03/21/2019 1009   BASOSABS 0.0 03/21/2019 1009   BASOSABS 0.0 09/08/2010 1059    BMET    Component Value Date/Time   NA 138 03/04/2021 0535   NA 142 02/04/2021 1150   K 4.3 03/04/2021 0535   CL 102 03/04/2021 0535   CO2 29 03/04/2021 0535   GLUCOSE 128 (H) 03/04/2021 0535   BUN 12 03/04/2021 0535   BUN 11 02/04/2021 1150   CREATININE 0.96 03/04/2021 0535   CREATININE 0.96 08/20/2016 1103   CALCIUM 9.0 03/04/2021 0535   GFRNONAA >60 03/04/2021 0535   GFRNONAA 86 08/20/2016 1103   GFRAA 96 01/08/2020 1034   GFRAA >89 08/20/2016 1103    INR    Component Value Date/Time   INR 1.0 03/03/2021 0746     Intake/Output Summary (Last 24 hours) at 03/04/2021 0734 Last data filed at 03/04/2021 0727 Gross per 24 hour  Intake  2390 ml  Output 1725 ml  Net 665 ml     Assessment:  65 y.o. male is s/p:  Right common femoral endarterectomy and bovine pericardial patch angioplasty  1 Day Post-Op  Plan: -brisk right DP/PT and incision looks good.  -pt up and mobiliziing today.  D/w pt if he is doing great this afternoon and pain controlled, we could discharge him, but feel like he will need another day. -d/w pt about pain management after review of PDMP.  He does see pain management, but has not signed a contract.  Will give percocet for pain control at discharge.  -DVT prophylaxis:  Sq heparin to start today.   Leontine Locket, PA-C [facility] [phone] 03/04/2021 7:34 AM  VASCULAR STAFF ADDENDUM: I have independently interviewed and examined the patient. I agree with the above.  Looks good POD#1 s/p RCFA endarterectomy for disabling claudication. Pain control fair.  Needs to mobilize: PT / OT / OOB during daylight hours / ambulate TID. Voiding trial. Ready for discharge if pain well controlled and mobilizing well. Suspect he will need another night.  Steven Hale. Stanford Breed, MD [facility]  of [employer] Number: [phone] 03/04/2021 8:10 AM

## 2021-03-04 NOTE — Discharge Summary (Incomplete)
Discharge Summary     Steven Hale. March 14, 1956 65 y.o. male  [id number]  Admission Date: 03/03/2021  Discharge Date: *** Physician: Cherre Robins, MD  Admission Diagnosis: PAD (peripheral artery disease) ([place]) [I73.9]  HPI:   This is a 65 y.o. male with atherosclerosis of native arteries of right lower extremity causing disabling claudication.  Recommend the following which can slow the progression of atherosclerosis and reduce the risk of major adverse cardiac / limb events:  Complete cessation from all tobacco products. Blood glucose control with goal A1c < 7%. Blood pressure control with goal blood pressure < 140/90 mmHg. Lipid reduction therapy with goal LDL-C <100 mg/dL (<70 if symptomatic from PAD).  Aspirin 81mg  PO QD.  Atorvastatin 40-80mg  PO QD (or other "high intensity" statin therapy).  Plan right femoral endarterectomy 03/03/2021.    Hospital Course:  The patient was admitted to the hospital and taken to the operating room on 03/03/2021 and underwent: Right common femoral endarterectomy and bovine pericardial patch angioplasty    Findings: Inflamed common femoral artery and femoral bifurcation.  Calcified, exophytic, near occlusive plaque in the common femoral artery.  Successful endarterectomy and patch angioplasty.  Excellent Doppler flow in the superficial femoral and profunda femoris artery after procedure.  The pt tolerated the procedure well and was transported to the PACU in good condition.   By POD 1, pt doing well with brisk doppler signals right DP/PT.  ***   CBC    Component Value Date/Time   WBC 10.6 (H) 03/04/2021 0535   RBC 3.48 (L) 03/04/2021 0535   HGB 10.4 (L) 03/04/2021 0535   HGB 12.9 (L) 02/04/2021 1150   HGB 12.4 (L) 09/08/2010 1059   HCT 31.8 (L) 03/04/2021 0535   HCT 38.8 02/04/2021 1150   HCT 36.7 (L) 09/08/2010 1059   PLT 326 03/04/2021 0535   PLT 284 02/04/2021 1150   MCV 91.4 03/04/2021 0535   MCV 87 02/04/2021  1150   MCV 88.6 09/08/2010 1059   MCH 29.9 03/04/2021 0535   MCHC 32.7 03/04/2021 0535   RDW 14.6 03/04/2021 0535   RDW 13.7 02/04/2021 1150   RDW 17.1 (H) 09/08/2010 1059   LYMPHSABS 2.9 03/21/2019 1009   LYMPHSABS 2.7 09/08/2010 1059   MONOABS 0.2 10/15/2014 1334   MONOABS 0.6 09/08/2010 1059   EOSABS 0.2 03/21/2019 1009   BASOSABS 0.0 03/21/2019 1009   BASOSABS 0.0 09/08/2010 1059    BMET    Component Value Date/Time   NA 138 03/04/2021 0535   NA 142 02/04/2021 1150   K 4.3 03/04/2021 0535   CL 102 03/04/2021 0535   CO2 29 03/04/2021 0535   GLUCOSE 128 (H) 03/04/2021 0535   BUN 12 03/04/2021 0535   BUN 11 02/04/2021 1150   CREATININE 0.96 03/04/2021 0535   CREATININE 0.96 08/20/2016 1103   CALCIUM 9.0 03/04/2021 0535   GFRNONAA >60 03/04/2021 0535   GFRNONAA 86 08/20/2016 1103   GFRAA 96 01/08/2020 1034   GFRAA >89 08/20/2016 1103       Discharge Diagnosis:  PAD (peripheral artery disease) ([place]) [I73.9]  Secondary Diagnosis: Patient Active Problem List   Diagnosis Date Noted  . Chronic abdominal pain 05/28/2020  . Solitary pulmonary nodule present on computed tomography of lung 05/27/2020  . Sleep apnea 06/05/2019  . Coronary artery disease involving coronary bypass graft of native heart   . Pre-diabetes 08/16/2015  . Peripheral neuropathy 07/09/2015  . Other male erectile dysfunction 03/07/2015  . Essential hypertension, benign  03/07/2015  . Pure hypercholesterolemia 12/10/2014  . Essential hypertension 12/10/2014  . S/P selective transsphenoidal pituitary adenomectomy 11/19/2014  . Lumbar disc herniation with radiculopathy 10/15/2014  . Chronic back pain 10/12/2014  . H/O pancreatic cancer 2009 10/12/2014  . Iron deficiency anemia 06/11/2014  . Testosterone deficiency 09/07/2013  . Benign paroxysmal positional vertigo 08/30/2013  . Non-insulin dependent type 2 diabetes mellitus ([place]) 08/30/2013  . PAD (peripheral artery disease) ([place]) 06/02/2012  .  Claudication ([place]) 05/14/2011  . PVD (peripheral vascular disease) ([place]) 01/08/2009  . BELL'S PALSY, RIGHT 12/25/2008  . Dyslipidemia, goal LDL below 70 06/05/2008  . Hx of CABG 2011 11/17/2003   Past Medical History:  Diagnosis Date  . Anemia, unspecified   . Bell's palsy    resolved - no deficits  . Blood transfusion    during treatment for Ca  . Blood transfusion without reported diagnosis   . CAD (coronary artery disease)    a. s/p multiple PCIs;  b. s/p CABG in 09/2010 (LIMA-LAD, SVG-OM1, SVG-distal RCA/OM2);  Cardiac cath in 12/2015: Mild LAD disease, occluded mid LCX and proximal RCA (left to right collaterals), Occluded SVG to RCA and atretic LIMA. Patent SVG to OM  . Chronic back pain   . Diabetes mellitus without complication ([place])    type 2  . DJD (degenerative joint disease)    low back & all over   . GERD (gastroesophageal reflux disease)   . Helicobacter pylori (H. pylori) infection   . Hemorrhoids   . Hyperlipidemia   . Hypertension   . Impotence of organic origin   . Myocardial infarction ([place])    2005 and 2012   . PAD (peripheral artery disease) ([place])    a. s/p bilat SFA stents;  b. ABIs 11/2012: R 0.99, L 0.86.  [name] Pancreatic cancer [facility])    surgery 2009  . Pituitary adenoma [facility])    surgery at [facility] Feb 2020  . Sleep apnea 06/05/2019   does not use cpap     Allergies as of 03/04/2021   No Known Allergies   Med Rec must be completed prior to using this [facility]***       Discharge Instructions: [facility] Discharge instructions Lower Extremity Bypass Surgery  Please refer to the following instruction for your post-procedure care. Your surgeon or physician assistant will discuss any changes with you.  Activity  You are encouraged to walk as much as you can. You can slowly return to normal activities during the month after your surgery. Avoid strenuous activity and heavy lifting until your doctor tells you it's OK.  Avoid activities such as vacuuming or swinging a golf club. Do not drive until your doctor give the OK and you are no longer taking prescription pain medications. It is also normal to have difficulty with sleep habits, eating and bowel movement after surgery. These will go away with time.  Bathing/Showering  You may shower after you go home. Do not soak in a bathtub, hot tub, or swim until the incision heals completely.  Incision Care  Clean your incision with mild soap and water. Shower every day. Pat the area dry with a clean towel. You do not need a bandage unless otherwise instructed. Do not apply any ointments or creams to your incision. If you have open wounds you will be instructed how to care for them or a visiting nurse may be arranged for you. If you have staples or sutures along your incision they will  be removed at your post-op appointment. You may have skin glue on your incision. Do not peel it off. It will come off on its own in about one week.  Wash the groin wound with soap and water daily and pat dry. (No tub bath-only shower)  Then put a dry gauze or washcloth in the groin to keep this area dry to help prevent wound infection.  Do this daily and as needed.  Do not use Vaseline or neosporin on your incisions.  Only use soap and water on your incisions and then protect and keep dry.  Diet  Resume your normal diet. There are no special food restrictions following this procedure. A low fat/ low cholesterol diet is recommended for all patients with vascular disease. In order to heal from your surgery, it is CRITICAL to get adequate nutrition. Your body requires vitamins, minerals, and protein. Vegetables are the best source of vitamins and minerals. Vegetables also provide the perfect balance of protein. Processed food has little nutritional value, so try to avoid this.  Medications  Resume taking all your medications unless your doctor or Physician Assistant tells you not to. If your  incision is causing pain, you may take over-the-counter pain relievers such as acetaminophen (Tylenol). If you were prescribed a stronger pain medication, please aware these medication can cause nausea and constipation. Prevent nausea by taking the medication with a snack or meal. Avoid constipation by drinking plenty of fluids and eating foods with high amount of fiber, such as fruits, vegetables, and grains. Take Colace 100 mg (an over-the-counter stool softener) twice a day as needed for constipation.  Do not take Tylenol if you are taking prescription pain medications.  Follow Up  Our office will schedule a follow up appointment 2-3 weeks following discharge.  Please call us immediately for any of the following conditions  .Severe or worsening pain in your legs or feet while at rest or while walking .Increase pain, redness, warmth, or drainage (pus) from your incision site(s) . Fever of 101 degree or higher . The swelling in your leg with the bypass suddenly worsens and becomes more painful than when you were in the hospital . If you have been instructed to feel your graft pulse then you should do so every day. If you can no longer feel this pulse, call the office immediately. Not all patients are given this instruction. .  Leg swelling is common after leg bypass surgery.  The swelling should improve over a few months following surgery. To improve the swelling, you may elevate your legs above the level of your heart while you are sitting or resting. Your surgeon or physician assistant may ask you to apply an ACE wrap or wear compression (TED) stockings to help to reduce swelling.  Reduce your risk of vascular disease  Stop smoking. If you would like help call [facility] at 1-800-QUIT-NOW 253-875-1747) or [place] at [phone].  . Manage your cholesterol . Maintain a desired weight . Control your diabetes weight . Control your diabetes . Keep your blood pressure down .  If you  have any questions, please call the office at [phone]   Prescriptions given: 1.  Roxicet #20 No Refill  Disposition: home  Patient's condition: is Good  Follow up: 1. [facility]  in 3-4 weeks   Leontine Locket, [place] [facility] [phone] 03/04/2021  7:43 AM  - For VQI Registry use ---   Post-op:  Wound infection: {yes/no:20286}  Graft infection: {yes/no:20286}  Transfusion: {  yes/no:20286}    If yes, *** units given New Arrhythmia: {yes/no:20286} Ipsilateral amputation: {yes/no:20286}, [ ]  Minor, [ ]  BKA, [ ]  AKA Discharge patency: [*** ] Primary, [ ]  Primary assisted, [ ]  Secondary, [ ]  Occluded Patency judged by: [*** ] Dopper only, [ ]  Palpable graft pulse, [***] Palpable distal pulse, [ ]  ABI inc. > 0.15, [ ]  Duplex Discharge ABI: R ***, L *** D/C Ambulatory Status: {VQI Ambulatory IWPYKD:98338}  Complications: MI: {SNK/NL:97673}, [ ]  Troponin only, [ ]  EKG or Clinical CHF: {yes/no:20286} Resp failure:{yes/no:20286}, [ ]  Pneumonia, [ ]  Ventilator Chg in renal function: {yes/no:20286}, [ ]  Inc. Cr > 0.5, [ ]  Temp. Dialysis,  [ ]  Permanent dialysis Stroke: {yes/no:20286}, [ ]  Minor, [ ]  Major Return to OR: {yes/no:20286}  Reason for return to OR: [ ]  Bleeding, [ ]  Infection, [ ]  Thrombosis, [ ]  Revision  Discharge medications: Statin use:  yes ASA use:  yes Plavix use:  no Beta blocker use: yes CCB use:  No ACEI use:   yes ARB use:  no Coumadin use: no

## 2021-03-05 ENCOUNTER — Other Ambulatory Visit (HOSPITAL_COMMUNITY): Payer: Self-pay

## 2021-03-05 ENCOUNTER — Encounter (HOSPITAL_COMMUNITY): Payer: Self-pay | Admitting: Vascular Surgery

## 2021-03-05 LAB — GLUCOSE, CAPILLARY
Glucose-Capillary: 130 mg/dL — ABNORMAL HIGH (ref 70–99)
Glucose-Capillary: 108 mg/dL — ABNORMAL HIGH (ref 70–99)

## 2021-03-05 MED ORDER — OXYCODONE-ACETAMINOPHEN 5-325 MG PO TABS
1.0000 | ORAL_TABLET | Freq: Four times a day (QID) | ORAL | 0 refills | Status: DC | PRN
  Filled 2021-03-05: qty 20, 5d supply, fill #0

## 2021-03-05 NOTE — Progress Notes (Signed)
  Progress Note    03/05/2021 8:35 AM 2 Days Post-Op  Subjective:  Ready for discharge   Vitals:   03/05/21 0332 03/05/21 0816  BP: 103/62 106/64  Pulse: 66 67  Resp: 15 14  Temp: 98 F (36.7 C) 97.8 F (36.6 C)  SpO2: 93% 94%   Physical Exam: Lungs:  Non labored Incisions:  R groin incision c/d/i Extremities:  R DP and PT by doppler Neurologic: A&O  CBC    Component Value Date/Time   WBC 10.6 (H) 03/04/2021 0535   RBC 3.48 (L) 03/04/2021 0535   HGB 10.4 (L) 03/04/2021 0535   HGB 12.9 (L) 02/04/2021 1150   HGB 12.4 (L) 09/08/2010 1059   HCT 31.8 (L) 03/04/2021 0535   HCT 38.8 02/04/2021 1150   HCT 36.7 (L) 09/08/2010 1059   PLT 326 03/04/2021 0535   PLT 284 02/04/2021 1150   MCV 91.4 03/04/2021 0535   MCV 87 02/04/2021 1150   MCV 88.6 09/08/2010 1059   MCH 29.9 03/04/2021 0535   MCHC 32.7 03/04/2021 0535   RDW 14.6 03/04/2021 0535   RDW 13.7 02/04/2021 1150   RDW 17.1 (H) 09/08/2010 1059   LYMPHSABS 2.9 03/21/2019 1009   LYMPHSABS 2.7 09/08/2010 1059   MONOABS 0.2 10/15/2014 1334   MONOABS 0.6 09/08/2010 1059   EOSABS 0.2 03/21/2019 1009   BASOSABS 0.0 03/21/2019 1009   BASOSABS 0.0 09/08/2010 1059    BMET    Component Value Date/Time   NA 138 03/04/2021 0535   NA 142 02/04/2021 1150   K 4.3 03/04/2021 0535   CL 102 03/04/2021 0535   CO2 29 03/04/2021 0535   GLUCOSE 128 (H) 03/04/2021 0535   BUN 12 03/04/2021 0535   BUN 11 02/04/2021 1150   CREATININE 0.96 03/04/2021 0535   CREATININE 0.96 08/20/2016 1103   CALCIUM 9.0 03/04/2021 0535   GFRNONAA >60 03/04/2021 0535   GFRNONAA 86 08/20/2016 1103   GFRAA 96 01/08/2020 1034   GFRAA >89 08/20/2016 1103    INR    Component Value Date/Time   INR 1.0 03/03/2021 0746     Intake/Output Summary (Last 24 hours) at 03/05/2021 0835 Last data filed at 03/05/2021 0558 Gross per 24 hour  Intake 644 ml  Output 600 ml  Net 44 ml     Assessment/Plan:  65 y.o. male is s/p R CFA endarterectomy 2  Days Post-Op   R foot well perfused with brisk DP and PT signal Incision of right groin is unremarkable HH PT arranged D/c home today   Dagoberto Ligas, PA-C [facility] [phone] 03/05/2021 8:35 AM

## 2021-03-06 NOTE — Discharge Summary (Signed)
Discharge Summary  Patient ID: Steven Hale [id number] 65 y.o. November 08, 1956  Admit date: 03/03/2021  Discharge date and time: 03/05/2021 12:08 PM   Admitting Physician: Steven Robins, MD   Discharge Physician: same  Admission Diagnoses: PAD (peripheral artery disease) [facility]) [I73.9]  Discharge Diagnoses: same  Admission Condition: fair  Discharged Condition: fair  Indication for Admission: Atherosclerosis of native arteries of right lower extremity causing disabling claudication  Hospital Course: [name] is a 65 year old male who was brought in as an outpatient due to right lower extremity disabling claudication and underwent right common femoral endarterectomy and bovine pericardial patch angioplasty by Dr. Stanford Hale on 03/03/2021.  Patient tolerated the procedure well and had an uncomplicated postoperative course.  At the time of discharge she had a brisk DP and PT signal in his right foot.  Right groin incision was clean, dry, intact with Steri-Strips.  He will follow-up in 2 to 3 weeks for incision check with arterial duplex and ABI.  He was prescribed 2 to 3 days of narcotic pain medication for continued postoperative pain control.  He was discharged home in stable condition.  Consults: None  Treatments: surgery: Right common femoral endarterectomy and bovine pericardial patch angioplasty by Dr. Stanford Hale on 03/03/2021  Discharge Exam: See progress note 03/05/21 Vitals:   03/05/21 0816 03/05/21 1105  BP: 106/64 101/61  Pulse: 67 68  Resp: 14 10  Temp: 97.8 F (36.6 C) 97.8 F (36.6 C)  SpO2: 94% 98%     Disposition: Discharge disposition: 01-Home or Self Care       Patient Instructions:  Allergies as of 03/05/2021   No Known Allergies     Medication List    TAKE these medications   amoxicillin 500 MG capsule Commonly known as: AMOXIL TAKE 4 CAPSULES BY MOUTH 1 HOUR PRIOR TO DENTAL APPOINTMENT   aspirin EC 81 MG tablet Take 1 tablet (81 mg total)  by mouth daily.   atorvastatin 40 MG tablet Commonly known as: LIPITOR Take 1 tablet (40 mg total) by mouth daily. What changed: when to take this   DULoxetine HCl 30 MG Csdr Take 30 mg by mouth 2 (two) times daily.   ezetimibe 10 MG tablet Commonly known as: ZETIA TAKE 1 TABLET BY MOUTH ONCE DAILY   fish oil-omega-3 fatty acids 1000 MG capsule Take 1 capsule (1 g total) by mouth daily.   levothyroxine 25 MCG tablet Commonly known as: SYNTHROID Take 25 mcg by mouth daily.   lisinopril 2.5 MG tablet Commonly known as: ZESTRIL Take 2.5 mg by mouth daily.   metFORMIN 500 MG tablet Commonly known as: GLUCOPHAGE Take 1 tablet (500 mg total) by mouth daily with breakfast. What changed: when to take this   metoprolol tartrate 25 MG tablet Commonly known as: LOPRESSOR Take 1 tablet by mouth two  times daily What changed:   how much to take  how to take this  when to take this  additional instructions   multivitamin tablet Take 1 tablet by mouth daily.   nitroGLYCERIN 0.4 MG SL tablet Commonly known as: NITROSTAT Place 0.4 mg under the tongue every 2 (two) hours as needed for chest pain.   oxyCODONE-acetaminophen 5-325 MG tablet Commonly known as: Percocet Take 1 tablet by mouth every 6 (six) hours as needed.   pantoprazole 40 MG tablet Commonly known as: PROTONIX TAKE 1 TABLET BY MOUTH ONCE DAILY What changed: Another medication with the same name was removed. Continue taking this medication, and follow  the directions you see here.   polyethylene glycol 17 g packet Commonly known as: MIRALAX / GLYCOLAX Take 17 g by mouth daily as needed for mild constipation or moderate constipation.   pregabalin 150 MG capsule Commonly known as: LYRICA Take 1 capsule (150 mg total) by mouth daily. What changed:   how much to take  when to take this   sildenafil 100 MG tablet Commonly known as: Viagra Take 0.5-1 tablets (50-100 mg total) by mouth daily as needed for  erectile dysfunction.   Systane Balance 0.6 % Soln Generic drug: Propylene Glycol Apply 1 drop to eye 4 (four) times daily.   tamsulosin 0.4 MG Caps capsule Commonly known as: FLOMAX TAKE 1 CAPSULE BY MOUTH DAILY. What changed:   how much to take  when to take this      Activity: activity as tolerated Diet: regular diet Wound Care: keep wound clean and dry  Follow-up with [facility] in 3 weeks.  Signed: Dagoberto Ligas, PA-C 03/06/2021 1:31 PM [facility] Office: [phone]

## 2021-03-09 ENCOUNTER — Encounter (HOSPITAL_COMMUNITY): Payer: Self-pay | Admitting: Emergency Medicine

## 2021-03-09 ENCOUNTER — Emergency Department (HOSPITAL_COMMUNITY): Payer: No Typology Code available for payment source

## 2021-03-09 ENCOUNTER — Other Ambulatory Visit: Payer: Self-pay

## 2021-03-09 ENCOUNTER — Emergency Department (HOSPITAL_COMMUNITY)
Admission: EM | Admit: 2021-03-09 | Discharge: 2021-03-10 | Disposition: A | Discharge status: Home / Self Care | Payer: No Typology Code available for payment source | Attending: Emergency Medicine | Admitting: Emergency Medicine

## 2021-03-09 DIAGNOSIS — Z87891 Personal history of nicotine dependence: Secondary | ICD-10-CM | POA: Insufficient documentation

## 2021-03-09 DIAGNOSIS — Z8507 Personal history of malignant neoplasm of pancreas: Secondary | ICD-10-CM | POA: Diagnosis not present

## 2021-03-09 DIAGNOSIS — Z7984 Long term (current) use of oral hypoglycemic drugs: Secondary | ICD-10-CM | POA: Insufficient documentation

## 2021-03-09 DIAGNOSIS — R350 Frequency of micturition: Secondary | ICD-10-CM | POA: Insufficient documentation

## 2021-03-09 DIAGNOSIS — R1031 Right lower quadrant pain: Secondary | ICD-10-CM

## 2021-03-09 DIAGNOSIS — I10 Essential (primary) hypertension: Secondary | ICD-10-CM | POA: Diagnosis not present

## 2021-03-09 DIAGNOSIS — Z79899 Other long term (current) drug therapy: Secondary | ICD-10-CM | POA: Diagnosis not present

## 2021-03-09 DIAGNOSIS — Z20822 Contact with and (suspected) exposure to covid-19: Secondary | ICD-10-CM | POA: Diagnosis not present

## 2021-03-09 DIAGNOSIS — E119 Type 2 diabetes mellitus without complications: Secondary | ICD-10-CM | POA: Insufficient documentation

## 2021-03-09 DIAGNOSIS — R059 Cough, unspecified: Secondary | ICD-10-CM | POA: Insufficient documentation

## 2021-03-09 DIAGNOSIS — Z951 Presence of aortocoronary bypass graft: Secondary | ICD-10-CM | POA: Diagnosis not present

## 2021-03-09 DIAGNOSIS — I2510 Atherosclerotic heart disease of native coronary artery without angina pectoris: Secondary | ICD-10-CM | POA: Insufficient documentation

## 2021-03-09 DIAGNOSIS — Z7982 Long term (current) use of aspirin: Secondary | ICD-10-CM | POA: Insufficient documentation

## 2021-03-09 LAB — LACTIC ACID, PLASMA: Lactic Acid, Venous: 1.8 mmol/L (ref 0.5–1.9)

## 2021-03-09 MED ORDER — FENTANYL CITRATE (PF) 100 MCG/2ML IJ SOLN
50.0000 ug | Freq: Once | INTRAMUSCULAR | Status: AC
  Administered 2021-03-09: 50 ug via INTRAVENOUS
  Filled 2021-03-09: qty 2

## 2021-03-09 NOTE — ED Triage Notes (Signed)
Patient presents with right sided pain. Patient denies other symptoms. Patient noted to have had a right femoral endarterectomy with patch angioplasty on 4/18

## 2021-03-09 NOTE — ED Provider Notes (Signed)
[facility] DEPT Provider Note   CSN: [id number] Arrival date & time: 03/09/21  2113     History Chief Complaint  Patient presents with  . Abdominal Pain    Steven Hale. is a 65 y.o. male.  The history is provided by the patient and medical records. No language interpreter was used.  Abdominal Pain Pain location:  R flank and RLQ Pain quality: aching   Pain radiates to:  Back Pain severity:  Severe Onset quality:  Gradual Duration:  1 day Timing:  Constant Progression:  Unchanged Chronicity:  New Context: previous surgery   Context: not trauma   Relieved by:  Nothing Worsened by:  Nothing Ineffective treatments:  None tried Associated symptoms: cough   Associated symptoms: no chest pain, no chills, no constipation, no diarrhea, no dysuria, no fatigue, no fever, no flatus, no nausea, no shortness of breath and no vomiting        Past Medical History:  Diagnosis Date  . Anemia, unspecified   . Bell's palsy    resolved - no deficits  . Blood transfusion    during treatment for Ca  . Blood transfusion without reported diagnosis   . CAD (coronary artery disease)    a. s/p multiple PCIs;  b. s/p CABG in 09/2010 (LIMA-LAD, SVG-OM1, SVG-distal RCA/OM2);  Cardiac cath in 12/2015: Mild LAD disease, occluded mid LCX and proximal RCA (left to right collaterals), Occluded SVG to RCA and atretic LIMA. Patent SVG to OM  . Chronic back pain   . Diabetes mellitus without complication ([place])    type 2  . DJD (degenerative joint disease)    low back & all over   . GERD (gastroesophageal reflux disease)   . Helicobacter pylori (H. pylori) infection   . Hemorrhoids   . Hyperlipidemia   . Hypertension   . Impotence of organic origin   . Myocardial infarction ([place])    2005 and 2012   . PAD (peripheral artery disease) ([place])    a. s/p bilat SFA stents;  b. ABIs 11/2012: R 0.99, L 0.86.  [name] Pancreatic cancer [facility])    surgery 2009  . Pituitary  adenoma [facility])    surgery at [facility] Feb 2020  . Sleep apnea 06/05/2019   does not use cpap    Patient Active Problem List   Diagnosis Date Noted  . Chronic abdominal pain 05/28/2020  . Solitary pulmonary nodule present on computed tomography of lung 05/27/2020  . Sleep apnea 06/05/2019  . Coronary artery disease involving coronary bypass graft of native heart   . Pre-diabetes 08/16/2015  . Peripheral neuropathy 07/09/2015  . Other male erectile dysfunction 03/07/2015  . Essential hypertension, benign 03/07/2015  . Pure hypercholesterolemia 12/10/2014  . Essential hypertension 12/10/2014  . S/P selective transsphenoidal pituitary adenomectomy 11/19/2014  . Lumbar disc herniation with radiculopathy 10/15/2014  . Chronic back pain 10/12/2014  . H/O pancreatic cancer 2009 10/12/2014  . Iron deficiency anemia 06/11/2014  . Testosterone deficiency 09/07/2013  . Benign paroxysmal positional vertigo 08/30/2013  . Non-insulin dependent type 2 diabetes mellitus ([place]) 08/30/2013  . PAD (peripheral artery disease) ([place]) 06/02/2012  . Claudication ([place]) 05/14/2011  . PVD (peripheral vascular disease) ([place]) 01/08/2009  . BELL'S PALSY, RIGHT 12/25/2008  . Dyslipidemia, goal LDL below 70 06/05/2008  . Hx of CABG 2011 11/17/2003    Past Surgical History:  Procedure Laterality Date  . ABDOMINAL AORTAGRAM N/A 09/13/2013   Procedure: ABDOMINAL Maxcine Ham;  Surgeon: Wellington Hampshire, MD;  Location: [facility] CATH LAB;  Service: Cardiovascular;  Laterality: N/A;  . ABDOMINAL AORTAGRAM N/A 08/29/2014   Procedure: ABDOMINAL Maxcine Ham;  Surgeon: Wellington Hampshire, MD;  Location: [facility] CATH LAB;  Service: Cardiovascular;  Laterality: N/A;  . ABDOMINAL AORTOGRAM W/LOWER EXTREMITY N/A 02/19/2021   Procedure: ABDOMINAL AORTOGRAM W/LOWER EXTREMITY;  Surgeon: Wellington Hampshire, MD;  Location: [facility] [place] [facility] CV LAB;  Service: Cardiovascular;  Laterality: N/A;  . BACK SURGERY     1992  . CARDIAC CATHETERIZATION N/A 12/25/2015    Procedure: Left Heart Cath and Cors/Grafts Angiography;  Surgeon: Wellington Hampshire, MD;  Location: [facility] CV LAB;  Service: Cardiovascular;  Laterality: N/A;  . COLONOSCOPY    . CORONARY ARTERY BYPASS GRAFT  nov 2011   x 4  . ENDARTERECTOMY FEMORAL Right 03/03/2021   Procedure: RIGHT FEMORAL ENDARTERECTOMY;  Surgeon: Cherre Robins, MD;  Location: [facility] OR;  Service: Vascular;  Laterality: Right;  . ESOPHAGOGASTRODUODENOSCOPY (EGD) WITH PROPOFOL N/A 04/02/2017   Procedure: ESOPHAGOGASTRODUODENOSCOPY (EGD) WITH PROPOFOL;  Surgeon: Carol Ada, MD;  Location: [facility] ENDOSCOPY;  Service: Endoscopy;  Laterality: N/A;  . HEMORRHOID SURGERY  10/30/2011   Procedure: HEMORRHOIDECTOMY;  Surgeon: Harl Bowie, MD;  Location: [facility];  Service: General;  Laterality: N/A;  . JOINT REPLACEMENT    . pancreatic  cancer  05/10/2008   Resection of distal panrease and spleen  . PARTIAL KNEE ARTHROPLASTY Right 11/19/2014   Procedure: RIGHT UNI-KNEE ARTHROPLASTY MEDIALLY;  Surgeon: Mauri Pole, MD;  Location: [facility] ORS;  Service: Orthopedics;  Laterality: Right;  . PATCH ANGIOPLASTY Right 03/03/2021   Procedure: PATCH ANGIOPLASTY;  Surgeon: Cherre Robins, MD;  Location: [facility];  Service: Vascular;  Laterality: Right;  . PERIPHERAL VASCULAR CATHETERIZATION  Oct 2014   Lt SFA PTA  . PERIPHERAL VASCULAR CATHETERIZATION  Oct 2015   ISR Lt SFA-PTA  . right partial knee replacement    . splenectomy    . THROMBECTOMY FEMORAL ARTERY Left 09/13/2013   Procedure: THROMBECTOMY FEMORAL ARTERY;  Surgeon: Wellington Hampshire, MD;  Location: [facility] CATH LAB;  Service: Cardiovascular;  Laterality: Left;  . TRANSPHENOIDAL / TRANSNASAL HYPOPHYSECTOMY / RESECTION PITUITARY TUMOR  12/2018   [facility]       Family History  Problem Relation Age of Onset  . Heart attack Father        died of MI at age 46  . Cancer Father   . Anesthesia problems Neg Hx   . Hypotension Neg Hx   . Malignant hyperthermia Neg Hx   . Pseudochol  deficiency Neg Hx   . Colon cancer Neg Hx   . Esophageal cancer Neg Hx   . Prostate cancer Neg Hx   . Rectal cancer Neg Hx   . Stomach cancer Neg Hx     Social History   Tobacco Use  . Smoking status: Former Smoker    Packs/day: 1.00    Years: 18.00    Pack years: 18.00    Types: Cigarettes    Quit date: 06/2019    Years since quitting: 1.7  . Smokeless tobacco: Never Used  Vaping Use  . Vaping Use: Never used  Substance Use Topics  . Alcohol use: Not Currently    Alcohol/week: 3.0 standard drinks    Types: 3 Shots of liquor per week    Comment: 2 drinks of brandy every week   . Drug use: No    Home Medications Prior to Admission medications   Medication Sig Start Date  End Date Taking? Authorizing Provider  amoxicillin (AMOXIL) 500 MG capsule TAKE 4 CAPSULES BY MOUTH 1 HOUR PRIOR TO DENTAL APPOINTMENT 01/31/21 01/31/22    aspirin EC 81 MG tablet Take 1 tablet (81 mg total) by mouth daily. 11/19/14   Danae Orleans, PA-C  atorvastatin (LIPITOR) 40 MG tablet Take 1 tablet (40 mg total) by mouth daily. Patient taking differently: Take 40 mg by mouth at bedtime. 07/07/16   Wellington Hampshire, MD  DULoxetine HCl 30 MG CSDR Take 30 mg by mouth 2 (two) times daily.    [provider]  ezetimibe (ZETIA) 10 MG tablet TAKE 1 TABLET BY MOUTH ONCE DAILY 07/30/20 07/30/21  Wellington Hampshire, MD  fish oil-omega-3 fatty acids 1000 MG capsule Take 1 capsule (1 g total) by mouth daily. 11/30/12   Rosana Hoes, MD  levothyroxine (SYNTHROID) 25 MCG tablet Take 25 mcg by mouth daily. 01/30/19   [provider]  lisinopril (ZESTRIL) 2.5 MG tablet Take 2.5 mg by mouth daily. 08/19/20   [provider]  metFORMIN (GLUCOPHAGE) 500 MG tablet Take 1 tablet (500 mg total) by mouth daily with breakfast. Patient taking differently: Take 500 mg by mouth 2 (two) times daily with a meal. 12/15/17   Delia Chimes A, MD  metoprolol tartrate (LOPRESSOR) 25 MG tablet Take 1 tablet by mouth two   times daily Patient taking differently: Take 25 mg by mouth 2 (two) times daily. 09/18/16   Lelon Perla, MD  Multiple Vitamin (MULTIVITAMIN) tablet Take 1 tablet by mouth daily. 11/30/12   Rosana Hoes, MD  nitroGLYCERIN (NITROSTAT) 0.4 MG SL tablet Place 0.4 mg under the tongue every 2 (two) hours as needed for chest pain. 10/28/16   [provider]  oxyCODONE-acetaminophen (PERCOCET) 5-325 MG tablet Take 1 tablet by mouth every 6 (six) hours as needed. 03/05/21   Dagoberto Ligas, PA-C  pantoprazole (PROTONIX) 40 MG tablet TAKE 1 TABLET BY MOUTH ONCE DAILY 09/30/20 09/30/21  Wellington Hampshire, MD  polyethylene glycol (MIRALAX / GLYCOLAX) 17 g packet Take 17 g by mouth daily as needed for mild constipation or moderate constipation.    [provider]  pregabalin (LYRICA) 150 MG capsule Take 1 capsule (150 mg total) by mouth daily. Patient taking differently: Take 300 mg by mouth 2 (two) times daily. 05/02/18   [name], MD  Propylene Glycol (SYSTANE BALANCE) 0.6 % SOLN Apply 1 drop to eye 4 (four) times daily.    [provider]  sildenafil (VIAGRA) 100 MG tablet Take 0.5-1 tablets (50-100 mg total) by mouth daily as needed for erectile dysfunction. 08/25/18   Horald Pollen, MD  tamsulosin (FLOMAX) 0.4 MG CAPS capsule TAKE 1 CAPSULE BY MOUTH DAILY. Patient taking differently: Take 0.4 mg by mouth at bedtime. 07/23/20 07/23/21  Daleen Squibb, MD    Allergies    Patient has no known allergies.  Review of Systems   Review of Systems  Constitutional: Negative for chills, diaphoresis, fatigue and fever.  HENT: Negative for congestion.   Eyes: Negative for visual disturbance.  Respiratory: Positive for cough. Negative for chest tightness, shortness of breath and wheezing.   Cardiovascular: Negative for chest pain and palpitations.  Gastrointestinal: Positive for abdominal pain. Negative for abdominal distention, constipation, diarrhea, flatus,  nausea and vomiting.  Genitourinary: Positive for flank pain. Negative for dysuria and frequency.  Musculoskeletal: Positive for back pain. Negative for neck pain and neck stiffness.  Neurological: Negative for weakness, light-headedness, numbness and  headaches.  Psychiatric/Behavioral: Negative for agitation.  All other systems reviewed and are negative.   Physical Exam Updated Vital Signs BP 122/71 (BP Location: Left Arm)   Pulse 73   Temp 98.1 F (36.7 C) (Oral)   Resp 18   SpO2 96%   Physical Exam Vitals and nursing note reviewed.  Constitutional:      General: He is not in acute distress.    Appearance: He is well-developed. He is not ill-appearing, toxic-appearing or diaphoretic.  HENT:     Head: Normocephalic and atraumatic.  Eyes:     Conjunctiva/sclera: Conjunctivae normal.  Cardiovascular:     Rate and Rhythm: Normal rate and regular rhythm.     Heart sounds: No murmur heard.   Pulmonary:     Effort: Pulmonary effort is normal. No respiratory distress.     Breath sounds: Normal breath sounds. No wheezing, rhonchi or rales.  Chest:     Chest wall: No tenderness.  Abdominal:     General: Abdomen is flat. Bowel sounds are normal.     Palpations: Abdomen is soft.     Tenderness: There is abdominal tenderness in the right lower quadrant. There is right CVA tenderness. There is no left CVA tenderness.    Musculoskeletal:     Cervical back: Neck supple.       Back:  Skin:    General: Skin is warm and dry.     Capillary Refill: Capillary refill takes less than 2 seconds.     Findings: No erythema or rash.  Neurological:     General: No focal deficit present.     Mental Status: He is alert.  Psychiatric:        Mood and Affect: Mood normal.     ED Results / Procedures / Treatments   Labs (all labs ordered are listed, but only abnormal results are displayed) Labs Reviewed  CBC WITH DIFFERENTIAL/PLATELET - Abnormal; Notable for the following components:       Result Value   RBC 3.73 (*)    Hemoglobin 12.2 (*)    HCT 34.2 (*)    All other components within normal limits  COMPREHENSIVE METABOLIC PANEL - Abnormal; Notable for the following components:   Glucose, Bld 193 (*)    All other components within normal limits  RESP PANEL BY RT-PCR (FLU A&B, COVID) ARPGX2  URINE CULTURE  LACTIC ACID, PLASMA  LIPASE, BLOOD  LACTIC ACID, PLASMA  URINALYSIS, ROUTINE W REFLEX MICROSCOPIC    EKG None  Radiology DG Chest 2 View  Result Date: 03/09/2021 CLINICAL DATA:  Cough.  Right flank and abdominal pain. EXAM: CHEST - 2 VIEW COMPARISON:  10/13/2016 FINDINGS: Postoperative changes in the mediastinum. Heart size and pulmonary vascularity are normal. Lungs are clear. No pleural effusions. No pneumothorax. Calcification of the aorta. IMPRESSION: No active cardiopulmonary disease. Electronically Signed   By: Lucienne Capers M.D.   On: 03/09/2021 22:52    Procedures Procedures   Medications Ordered in ED Medications  fentaNYL (SUBLIMAZE) injection 50 mcg (50 mcg Intravenous Given 03/09/21 2332)  fentaNYL (SUBLIMAZE) injection 50 mcg (50 mcg Intravenous Given 03/10/21 0044)  iohexol (OMNIPAQUE) 350 MG/ML injection 100 mL (100 mLs Intravenous Contrast Given 03/10/21 0058)    ED Course  I have reviewed the triage vital signs and the nursing notes.  Pertinent labs & imaging results that were available during my care of the patient were reviewed by me and considered in my medical decision making (see chart for details).  MDM Rules/Calculators/A&P                          Rajat Staver. is a 65 y.o. male with a past medical history significant for hypertension, hyperlipidemia, hypercholesterolemia, prior CABG, pancreatic cancer, and recent right common femoral endarterectomy and patch angioplasty who presents with 1 day of right abdominal pain rating towards his flank and back and inguinal area.  Patient reports that he is postop day 6 after having  a right common femoral endarterectomy and patch angioplasty performed by vascular surgery with Dr. Stanford Breed for right leg pain.  Patient reports that he was doing well until this morning when he started having pain in his right abdomen going towards his right flank.  He reports it is very painful when he bends his right leg.  He denies any new leg pain, numbness, or weakness.  He denies any skin changes or rash.  He reports that his wound is slightly painful but it denies any drainage, redness, or warmth.  Denies fevers, chills, chest pain, shortness of breath, nausea, vomiting, constipation, diarrhea, or dysuria.  He does report some urinary frequency.  He also reports some dry cough.  He reports the pain is up to 10 out of 10 in severity but is currently a 7 out of 10.  On exam, lungs clear and chest nontender.  Abdomen is tender in the right abdomen, right flank, and right inguinal area.  Patient does not have any drainage or warmth or crepitance.  He did have intact DP pulse on exam.  Normal sensation and strength in the foot.  Patient did not want to flex his right hip due to the pain worsening in his abdomen.  I spoke with his surgeon, Dr. Stanford Breed with vascular surgery who actually performed the surgery last week.  He recommends getting CTA of the abdomen and pelvis to evaluate for aneurysm, retroperitoneal hematoma, infection, or other postoperative complication.  We will get screening labs as well.  Patient was given pain medicine.  Will likely call vascular surgery with results of his imaging to discuss further management after work-up is completed.  Anticipate reassessment.       Care transferred to oncoming team while waiting for results of CTA abdomen pelvis.  Final Clinical Impression(s) / ED Diagnoses Final diagnoses:  Right lower quadrant abdominal pain     Clinical Impression: 1. Right lower quadrant abdominal pain     Disposition: Care transferred to oncoming team awaiting for  results of diagnostic imaging and touching base with vascular surgery if there are any concerning findings.  If work-up is totally reassuring, anticipate discharge home.   This note was prepared with assistance of Systems analyst. Occasional wrong-word or sound-a-like substitutions may have occurred due to the inherent limitations of voice recognition software.     Marysa Wessner, Gwenyth Allegra, MD 03/10/21 [phone]

## 2021-03-10 ENCOUNTER — Emergency Department (HOSPITAL_COMMUNITY): Payer: No Typology Code available for payment source

## 2021-03-10 ENCOUNTER — Encounter (HOSPITAL_COMMUNITY): Payer: Self-pay

## 2021-03-10 ENCOUNTER — Telehealth: Payer: Self-pay

## 2021-03-10 LAB — COMPREHENSIVE METABOLIC PANEL
ALT: 20 U/L (ref 0–44)
AST: 24 U/L (ref 15–41)
Albumin: 4.6 g/dL (ref 3.5–5.0)
Alkaline Phosphatase: 84 U/L (ref 38–126)
Anion gap: 11 (ref 5–15)
BUN: 13 mg/dL (ref 8–23)
CO2: 24 mmol/L (ref 22–32)
Calcium: 9.5 mg/dL (ref 8.9–10.3)
Chloride: 100 mmol/L (ref 98–111)
Creatinine, Ser: 0.93 mg/dL (ref 0.61–1.24)
GFR, Estimated: >60 mL/min (ref >60)
Glucose, Bld: 193 mg/dL — ABNORMAL HIGH (ref 70–99)
Potassium: 4.4 mmol/L (ref 3.5–5.1)
Sodium: 135 mmol/L (ref 135–145)
Total Bilirubin: 0.4 mg/dL (ref 0.3–1.2)
Total Protein: 8 g/dL (ref 6.5–8.1)

## 2021-03-10 LAB — CBC WITH DIFFERENTIAL/PLATELET
Abs Immature Granulocytes: 0.02 10*3/uL (ref 0.00–0.07)
Basophils Absolute: 0.1 10*3/uL (ref 0.0–0.1)
Basophils Relative: 1 %
Eosinophils Absolute: 0.3 10*3/uL (ref 0.0–0.5)
Eosinophils Relative: 5 %
HCT: 34.2 % — ABNORMAL LOW (ref 39.0–52.0)
Hemoglobin: 12.2 g/dL — ABNORMAL LOW (ref 13.0–17.0)
Immature Granulocytes: 0 %
Lymphocytes Relative: 44 %
Lymphs Abs: 2.8 10*3/uL (ref 0.7–4.0)
MCH: 32.7 pg (ref 26.0–34.0)
MCHC: 35.7 g/dL (ref 30.0–36.0)
MCV: 91.7 fL (ref 80.0–100.0)
Monocytes Absolute: 0.8 10*3/uL (ref 0.1–1.0)
Monocytes Relative: 13 %
Neutro Abs: 2.3 10*3/uL (ref 1.7–7.7)
Neutrophils Relative %: 37 %
Platelets: 398 10*3/uL (ref 150–400)
RBC: 3.73 MIL/uL — ABNORMAL LOW (ref 4.22–5.81)
RDW: 14.7 % (ref 11.5–15.5)
WBC: 6.3 10*3/uL (ref 4.0–10.5)
nRBC: 0 % (ref 0.0–0.2)

## 2021-03-10 LAB — URINALYSIS, ROUTINE W REFLEX MICROSCOPIC
Bacteria, UA: NONE SEEN
Bilirubin Urine: NEGATIVE
Glucose, UA: NEGATIVE mg/dL
Hgb urine dipstick: NEGATIVE
Ketones, ur: NEGATIVE mg/dL
Leukocytes,Ua: NEGATIVE
Nitrite: NEGATIVE
Protein, ur: NEGATIVE mg/dL
Specific Gravity, Urine: 1.019 (ref 1.005–1.030)
pH: 7 (ref 5.0–8.0)

## 2021-03-10 LAB — RESP PANEL BY RT-PCR (FLU A&B, COVID) ARPGX2
Influenza A by PCR: NEGATIVE
Influenza B by PCR: NEGATIVE
SARS Coronavirus 2 by RT PCR: NEGATIVE

## 2021-03-10 LAB — LIPASE, BLOOD: Lipase: 28 U/L (ref 11–51)

## 2021-03-10 LAB — LACTIC ACID, PLASMA: Lactic Acid, Venous: 1.2 mmol/L (ref 0.5–1.9)

## 2021-03-10 MED ORDER — FENTANYL CITRATE (PF) 100 MCG/2ML IJ SOLN
50.0000 ug | Freq: Once | INTRAMUSCULAR | Status: AC
  Administered 2021-03-10: 50 ug via INTRAVENOUS
  Filled 2021-03-10: qty 2

## 2021-03-10 MED ORDER — IOHEXOL 350 MG/ML SOLN
100.0000 mL | Freq: Once | INTRAVENOUS | Status: AC | PRN
  Administered 2021-03-10: 100 mL via INTRAVENOUS

## 2021-03-10 NOTE — Telephone Encounter (Signed)
Patient presented to emergency room last night with pain in the right groin area. A CT scan was ordered for possible retroperitoneal hematoma - performed - [facility] is aware of results - a small hematoma - patient was discharged home in stable condition. Patient called to ask for pain medicine says the hospital gave him fentanyl and that helped. He has been taking percocet for pain today. Dr Stanford Breed aware of patient CT results and approved patient on schedule for tomorrow. He has asked for more pain medicine - advised him to go to the ED if pain becoming more unmanageable or we would see him in the morning. Patient is now calling to request Dr. Mora Appl home phone.

## 2021-03-11 ENCOUNTER — Encounter: Payer: Medicare Other | Admitting: Cardiovascular Disease

## 2021-03-11 ENCOUNTER — Ambulatory Visit (INDEPENDENT_AMBULATORY_CARE_PROVIDER_SITE_OTHER): Payer: Medicare Other | Admitting: Vascular Surgery

## 2021-03-11 ENCOUNTER — Other Ambulatory Visit: Payer: Self-pay

## 2021-03-11 ENCOUNTER — Encounter: Payer: Self-pay | Admitting: Vascular Surgery

## 2021-03-11 VITALS — BP 103/66 | HR 61 | Temp 97.2°F | Resp 20 | Ht 69.0 in | Wt 233.0 lb

## 2021-03-11 DIAGNOSIS — I739 Peripheral vascular disease, unspecified: Secondary | ICD-10-CM

## 2021-03-11 LAB — URINE CULTURE: Culture: <10000 — AB

## 2021-03-11 NOTE — Progress Notes (Signed)
The patient was late to the appointment and this visit was rescheduled. This encounter was created in error - please disregard.

## 2021-03-11 NOTE — Progress Notes (Signed)
[facility] [place]  ASSESSMENT / PLAN: Steven Hale. is a 65 y.o. male status post right femoral endarterectomy 03/03/21. He returned to the ER 03/09/21 for right lower quadrant and flank pain. CTA shows small postoperative hematoma in the groin. I do not think this can explain his abdominal pain. No residual stenosis in common femoral artery.  Recommend the following which can slow the progression of atherosclerosis and reduce the risk of major adverse cardiac / limb events:  Complete cessation from all tobacco products. Blood glucose control with goal A1c < 7%. Blood pressure control with goal blood pressure < 140/90 mmHg. Lipid reduction therapy with goal LDL-C <100 mg/dL (<70 if symptomatic from PAD).  Aspirin 81mg  PO QD.  Atorvastatin 40-80mg  PO QD (or other "high intensity" statin therapy).  Follow up with PA in 1 month with ABI. Refer to GI for evaluation of abdominal pain.   CHIEF COMPLAINT: Calf pain.  HISTORY OF PRESENT ILLNESS: Previously:Steven Hale. is a 65 y.o. male referred to clinic for evaluation after angiogram demonstrating severe right common femoral artery atherosclerotic plaque.  The patient reports bilateral calf claudication which has become disabling.  He is a remote smoker, having quit in 2009.  He is compliant with best medical therapy for peripheral arterial disease.  Complicating things is a history of chronic back pain with neurogenic claudication.  I counseled the patient extensively about the nature of this plaque, and that treating it with endarterectomy will only provide symptom relief on the right side.  03/11/21: Returns to clinic for postoperative evaluation.  He went to the ER this past Sunday, 03/09/2021 for right upper quadrant, right lower quadrant and right back pain. CTA performed showed mild postop hematoma. No technical issues with repair. No explanation for abdominal pain on CT scan.  He reports no nausea, vomiting,  diarrhea, or constipation.  Positional changes seem to worsen the pain.  He denies any trauma. He reports claudication in his right calf has resolved   Past Medical History:  Diagnosis Date  . Anemia, unspecified   . Bell's palsy    resolved - no deficits  . Blood transfusion    during treatment for Ca  . Blood transfusion without reported diagnosis   . CAD (coronary artery disease)    a. s/p multiple PCIs;  b. s/p CABG in 09/2010 (LIMA-LAD, SVG-OM1, SVG-distal RCA/OM2);  Cardiac cath in 12/2015: Mild LAD disease, occluded mid LCX and proximal RCA (left to right collaterals), Occluded SVG to RCA and atretic LIMA. Patent SVG to OM  . Chronic back pain   . Diabetes mellitus without complication ([place])    type 2  . DJD (degenerative joint disease)    low back & all over   . GERD (gastroesophageal reflux disease)   . Helicobacter pylori (H. pylori) infection   . Hemorrhoids   . Hyperlipidemia   . Hypertension   . Impotence of organic origin   . Myocardial infarction ([place])    2005 and 2012   . PAD (peripheral artery disease) ([place])    a. s/p bilat SFA stents;  b. ABIs 11/2012: R 0.99, L 0.86.  [name] Pancreatic cancer [facility])    surgery 2009  . Pituitary adenoma [facility])    surgery at [facility] Feb 2020  . Sleep apnea 06/05/2019   does not use cpap    Past Surgical History:  Procedure Laterality Date  . ABDOMINAL AORTAGRAM N/A 09/13/2013   Procedure: ABDOMINAL AORTAGRAM;  Surgeon: Wellington Hampshire,  MD;  Location: [place] CATH LAB;  Service: Cardiovascular;  Laterality: N/A;  . ABDOMINAL AORTAGRAM N/A 08/29/2014   Procedure: ABDOMINAL Maxcine Ham;  Surgeon: Wellington Hampshire, MD;  Location: [place] CATH LAB;  Service: Cardiovascular;  Laterality: N/A;  . ABDOMINAL AORTOGRAM W/LOWER EXTREMITY N/A 02/19/2021   Procedure: ABDOMINAL AORTOGRAM W/LOWER EXTREMITY;  Surgeon: Wellington Hampshire, MD;  Location: [place] CV LAB;  Service: Cardiovascular;  Laterality: N/A;  . BACK SURGERY     1992  . CARDIAC  CATHETERIZATION N/A 12/25/2015   Procedure: Left Heart Cath and Cors/Grafts Angiography;  Surgeon: Wellington Hampshire, MD;  Location: [place] CV LAB;  Service: Cardiovascular;  Laterality: N/A;  . COLONOSCOPY    . CORONARY ARTERY BYPASS GRAFT  nov 2011   x 4  . ENDARTERECTOMY FEMORAL Right 03/03/2021   Procedure: RIGHT FEMORAL ENDARTERECTOMY;  Surgeon: Cherre Robins, MD;  Location: [facility] OR;  Service: Vascular;  Laterality: Right;  . ESOPHAGOGASTRODUODENOSCOPY (EGD) WITH PROPOFOL N/A 04/02/2017   Procedure: ESOPHAGOGASTRODUODENOSCOPY (EGD) WITH PROPOFOL;  Surgeon: Carol Ada, MD;  Location: [facility] ENDOSCOPY;  Service: Endoscopy;  Laterality: N/A;  . HEMORRHOID SURGERY  10/30/2011   Procedure: HEMORRHOIDECTOMY;  Surgeon: Harl Bowie, MD;  Location: [place];  Service: General;  Laterality: N/A;  . JOINT REPLACEMENT    . pancreatic  cancer  05/10/2008   Resection of distal panrease and spleen  . PARTIAL KNEE ARTHROPLASTY Right 11/19/2014   Procedure: RIGHT UNI-KNEE ARTHROPLASTY MEDIALLY;  Surgeon: Mauri Pole, MD;  Location: [facility] ORS;  Service: Orthopedics;  Laterality: Right;  . PATCH ANGIOPLASTY Right 03/03/2021   Procedure: PATCH ANGIOPLASTY;  Surgeon: Cherre Robins, MD;  Location: [place];  Service: Vascular;  Laterality: Right;  . PERIPHERAL VASCULAR CATHETERIZATION  Oct 2014   Lt SFA PTA  . PERIPHERAL VASCULAR CATHETERIZATION  Oct 2015   ISR Lt SFA-PTA  . right partial knee replacement    . splenectomy    . THROMBECTOMY FEMORAL ARTERY Left 09/13/2013   Procedure: THROMBECTOMY FEMORAL ARTERY;  Surgeon: Wellington Hampshire, MD;  Location: [place] CATH LAB;  Service: Cardiovascular;  Laterality: Left;  . TRANSPHENOIDAL / TRANSNASAL HYPOPHYSECTOMY / RESECTION PITUITARY TUMOR  12/2018   [facility]    Family History  Problem Relation Age of Onset  . Heart attack Father        died of MI at age 42  . Cancer Father   . Anesthesia problems Neg Hx   . Hypotension Neg Hx   . Malignant hyperthermia Neg  Hx   . Pseudochol deficiency Neg Hx   . Colon cancer Neg Hx   . Esophageal cancer Neg Hx   . Prostate cancer Neg Hx   . Rectal cancer Neg Hx   . Stomach cancer Neg Hx     Social History   Socioeconomic History  . Marital status: Single    Spouse name: Not on file  . Number of children: 0  . Years of education: Not on file  . Highest education level: Not on file  Occupational History  . Occupation: TEFL teacher: UNEMPLOYED  Tobacco Use  . Smoking status: Former Smoker    Packs/day: 1.00    Years: 18.00    Pack years: 18.00    Types: Cigarettes    Quit date: 06/2019    Years since quitting: 1.7  . Smokeless tobacco: Never Used  Vaping Use  . Vaping Use: Never used  Substance and Sexual Activity  . Alcohol use:  Not Currently    Alcohol/week: 3.0 standard drinks    Types: 3 Shots of liquor per week    Comment: 2 drinks of brandy every week   . Drug use: No  . Sexual activity: Not Currently  Other Topics Concern  . Not on file  Social History Narrative   He works at the night shift at [employer] part time   [employer] [phone]   Single, no children   Social Determinants of Radio broadcast assistant Strain: Not on file  Food Insecurity: Not on file  Transportation Needs: Not on file  Physical Activity: Not on file  Stress: Not on file  Social Connections: Not on file  Intimate Partner Violence: Not on file    No Known Allergies  Current Outpatient Medications  Medication Sig Dispense Refill  . amoxicillin (AMOXIL) 500 MG capsule TAKE 4 CAPSULES BY MOUTH 1 HOUR PRIOR TO DENTAL APPOINTMENT 20 capsule 1  . aspirin EC 81 MG tablet Take 1 tablet (81 mg total) by mouth daily.    [name] atorvastatin (LIPITOR) 40 MG tablet Take 1 tablet (40 mg total) by mouth daily. (Patient taking differently: Take 40 mg by mouth at bedtime.) 90 tablet 3  . DULoxetine HCl 30 MG CSDR Take 30 mg by mouth 2 (two) times daily.    [name] ezetimibe (ZETIA) 10 MG tablet TAKE 1 TABLET BY  MOUTH ONCE DAILY 90 tablet 3  . fish oil-omega-3 fatty acids 1000 MG capsule Take 1 capsule (1 g total) by mouth daily. 60 capsule 1  . levothyroxine (SYNTHROID) 25 MCG tablet Take 25 mcg by mouth daily.    [name] lisinopril (ZESTRIL) 2.5 MG tablet Take 2.5 mg by mouth daily.    . metFORMIN (GLUCOPHAGE) 500 MG tablet Take 1 tablet (500 mg total) by mouth daily with breakfast. (Patient taking differently: Take 500 mg by mouth 2 (two) times daily with a meal.) 90 tablet 3  . metoprolol tartrate (LOPRESSOR) 25 MG tablet Take 1 tablet by mouth two  times daily (Patient taking differently: Take 25 mg by mouth 2 (two) times daily.) 60 tablet 0  . Multiple Vitamin (MULTIVITAMIN) tablet Take 1 tablet by mouth daily. 30 tablet 10  . nitroGLYCERIN (NITROSTAT) 0.4 MG SL tablet Place 0.4 mg under the tongue every 2 (two) hours as needed for chest pain.    [name] oxyCODONE-acetaminophen (PERCOCET) 5-325 MG tablet Take 1 tablet by mouth every 6 (six) hours as needed. 20 tablet 0  . pantoprazole (PROTONIX) 40 MG tablet TAKE 1 TABLET BY MOUTH ONCE DAILY 30 tablet 6  . polyethylene glycol (MIRALAX / GLYCOLAX) 17 g packet Take 17 g by mouth daily as needed for mild constipation or moderate constipation.    . pregabalin (LYRICA) 150 MG capsule Take 1 capsule (150 mg total) by mouth daily. (Patient taking differently: Take 300 mg by mouth 2 (two) times daily.) 30 capsule 0  . Propylene Glycol (SYSTANE BALANCE) 0.6 % SOLN Apply 1 drop to eye 4 (four) times daily.    . sildenafil (VIAGRA) 100 MG tablet Take 0.5-1 tablets (50-100 mg total) by mouth daily as needed for erectile dysfunction. 5 tablet 11  . tamsulosin (FLOMAX) 0.4 MG CAPS capsule TAKE 1 CAPSULE BY MOUTH DAILY. (Patient taking differently: Take 0.4 mg by mouth at bedtime.) 30 capsule 2   No current facility-administered medications for this visit.    REVIEW OF SYSTEMS:  [X]  denotes positive finding, [ ]  denotes negative finding Cardiac  Comments:  Chest pain or  chest pressure:    Shortness of breath upon exertion:    Short of breath when lying flat:    Irregular heart rhythm:        Vascular    Pain in calf, thigh, or hip brought on by ambulation:    Pain in feet at night that wakes you up from your sleep:     Blood clot in your veins:    Leg swelling:         Pulmonary    Oxygen at home:    Productive cough:     Wheezing:         Neurologic    Sudden weakness in arms or legs:     Sudden numbness in arms or legs:     Sudden onset of difficulty speaking or slurred speech:    Temporary loss of vision in one eye:     Problems with dizziness:         Gastrointestinal    Blood in stool:     Vomited blood:         Genitourinary    Burning when urinating:     Blood in urine:        Psychiatric    Major depression:         Hematologic    Bleeding problems:    Problems with blood clotting too easily:        Skin    Rashes or ulcers:        Constitutional    Fever or chills:      PHYSICAL EXAM  Vitals:   03/11/21 0917  BP: 103/66  Pulse: 61  Resp: 20  Temp: (!) 97.2 F (36.2 C)  SpO2: 95%  Weight: 233 lb (105.7 kg)  Height: 5\' 9"  (1.753 m)    Constitutional: well appearing. no distress. Appears well nourished.  Neurologic: CN intact. no focal findings. no sensory loss. Psychiatric: Mood and affect symmetric and appropriate. Eyes: No icterus. No conjunctival pallor. Ears, nose, throat: mucous membranes moist. Midline trachea.  Cardiac: regular rate and rhythm.  Respiratory: unlabored. Abdominal: soft, non-tender, non-distended. No flank tenderness. No masses. No muscular spasm. Peripheral vascular:  Mild R groin hematoma  Right groin incision clean and dry with steri strips.  2+ L femoral pulse Extremity: No edema. No cyanosis. No pallor.  Skin: No gangrene. No ulceration.  Lymphatic: No Stemmer's sign. No palpable lymphadenopathy.  PERTINENT LABORATORY AND RADIOLOGIC DATA  Most recent CBC CBC Latest Ref  Rng & Units 03/09/2021 03/04/2021 03/03/2021  WBC 4.0 - 10.5 K/uL 6.3 10.6(H) 7.3  Hemoglobin 13.0 - 17.0 g/dL 12.2(L) 10.4(L) 11.0(L)  Hematocrit 39.0 - 52.0 % 34.2(L) 31.8(L) 33.9(L)  Platelets 150 - 400 K/uL 398 326 341     Most recent CMP CMP Latest Ref Rng & Units 03/09/2021 03/04/2021 03/03/2021  Glucose 70 - 99 mg/dL 193(H) 128(H) -  BUN 8 - 23 mg/dL 13 12 -  Creatinine 0.61 - 1.24 mg/dL 0.93 0.96 1.02  Sodium 135 - 145 mmol/L 135 138 -  Potassium 3.5 - 5.1 mmol/L 4.4 4.3 -  Chloride 98 - 111 mmol/L 100 102 -  CO2 22 - 32 mmol/L 24 29 -  Calcium 8.9 - 10.3 mg/dL 9.5 9.0 -  Total Protein 6.5 - 8.1 g/dL 8.0 - -  Total Bilirubin 0.3 - 1.2 mg/dL 0.4 - -  Alkaline Phos 38 - 126 U/L 84 - -  AST 15 - 41 U/L 24 - -  ALT 0 - 44 U/L 20 - -    Renal function Estimated Creatinine Clearance: 94.9 mL/min (by C-G formula based on SCr of 0.93 mg/dL).  Hgb A1c MFr Bld (%)  Date Value  03/03/2021 7.6 (H)    LDL Chol Calc (NIH)  Date Value Ref Range Status  08/09/2020 49 0 - 99 mg/dL Final   LDL Cholesterol  Date Value Ref Range Status  03/04/2021 51 0 - 99 mg/dL Final    Comment:           Total Cholesterol/HDL:CHD Risk Coronary Heart Disease Risk Table                     Men   Women  1/2 Average Risk   3.4   3.3  Average Risk       5.0   4.4  2 X Average Risk   9.6   7.1  3 X Average Risk  23.4   11.0        Use the calculated Patient Ratio above and the CHD Risk Table to determine the patient's CHD Risk.        ATP III CLASSIFICATION (LDL):  <100     mg/dL   Optimal  100-129  mg/dL   Near or Above                    Optimal  130-159  mg/dL   Borderline  160-189  mg/dL   High  >190     mg/dL   Very High Performed at [facility] [address] [place], [place] [address]    Direct LDL  Date Value Ref Range Status  11/06/2013 80 mg/dL Final    Comment:    ATP III Classification (LDL):       < 100        mg/dL         Optimal      100 - 129     mg/dL          Near or Above Optimal      130 - 159     mg/dL         Borderline High      160 - 189     mg/dL         High       > 190        mg/dL         Very High       Dr. Tyrell Antonio angiogram personally reviewed. Severe, exophytic flow-limiting plaque in the right common femoral artery.  CTA shows no technical problems with repair. Small groin hematoma. No explanation on my review for right sided abdominal pain.   Yevonne Aline. Stanford Breed, MD [facility] Phone Number: [phone] 03/11/2021 9:16 AM

## 2021-03-12 ENCOUNTER — Other Ambulatory Visit (HOSPITAL_COMMUNITY): Payer: Self-pay

## 2021-03-12 ENCOUNTER — Encounter (HOSPITAL_COMMUNITY): Payer: Self-pay

## 2021-03-12 ENCOUNTER — Emergency Department (HOSPITAL_COMMUNITY)
Admission: EM | Admit: 2021-03-12 | Discharge: 2021-03-12 | Disposition: A | Discharge status: Home / Self Care | Payer: No Typology Code available for payment source | Attending: Emergency Medicine | Admitting: Emergency Medicine

## 2021-03-12 DIAGNOSIS — I2510 Atherosclerotic heart disease of native coronary artery without angina pectoris: Secondary | ICD-10-CM | POA: Insufficient documentation

## 2021-03-12 DIAGNOSIS — Z87891 Personal history of nicotine dependence: Secondary | ICD-10-CM | POA: Insufficient documentation

## 2021-03-12 DIAGNOSIS — Z951 Presence of aortocoronary bypass graft: Secondary | ICD-10-CM | POA: Insufficient documentation

## 2021-03-12 DIAGNOSIS — Z7984 Long term (current) use of oral hypoglycemic drugs: Secondary | ICD-10-CM | POA: Diagnosis not present

## 2021-03-12 DIAGNOSIS — Z96651 Presence of right artificial knee joint: Secondary | ICD-10-CM | POA: Insufficient documentation

## 2021-03-12 DIAGNOSIS — B029 Zoster without complications: Secondary | ICD-10-CM

## 2021-03-12 DIAGNOSIS — E119 Type 2 diabetes mellitus without complications: Secondary | ICD-10-CM | POA: Insufficient documentation

## 2021-03-12 DIAGNOSIS — I10 Essential (primary) hypertension: Secondary | ICD-10-CM | POA: Insufficient documentation

## 2021-03-12 DIAGNOSIS — Z79899 Other long term (current) drug therapy: Secondary | ICD-10-CM | POA: Diagnosis not present

## 2021-03-12 DIAGNOSIS — Z8507 Personal history of malignant neoplasm of pancreas: Secondary | ICD-10-CM | POA: Insufficient documentation

## 2021-03-12 DIAGNOSIS — R109 Unspecified abdominal pain: Secondary | ICD-10-CM | POA: Insufficient documentation

## 2021-03-12 DIAGNOSIS — Z7982 Long term (current) use of aspirin: Secondary | ICD-10-CM | POA: Insufficient documentation

## 2021-03-12 MED ORDER — VALACYCLOVIR HCL 1 G PO TABS
1000.0000 mg | ORAL_TABLET | Freq: Three times a day (TID) | ORAL | 0 refills | Status: DC
  Filled 2021-03-12: qty 21, 7d supply, fill #0

## 2021-03-12 MED ORDER — PREDNISONE 20 MG PO TABS
60.0000 mg | ORAL_TABLET | Freq: Once | ORAL | Status: AC
  Administered 2021-03-12: 60 mg via ORAL
  Filled 2021-03-12: qty 3

## 2021-03-12 MED ORDER — PREDNISONE 20 MG PO TABS
ORAL_TABLET | ORAL | 0 refills | Status: DC
  Filled 2021-03-12: qty 8, 4d supply, fill #0

## 2021-03-12 MED ORDER — MORPHINE SULFATE 15 MG PO TABS
7.5000 mg | ORAL_TABLET | ORAL | 0 refills | Status: DC | PRN
  Filled 2021-03-12: qty 7, 3d supply, fill #0

## 2021-03-12 NOTE — ED Triage Notes (Signed)
Pt arrived via walk in, c/o diffuse abd pain. States pain since 4/18 after right femoral endarterectomy with patch angioplasty. Denies any n/v or diarrhea. Has follow up with GI in a few months.

## 2021-03-12 NOTE — ED Provider Notes (Signed)
[facility] DEPT Provider Note   CSN: [id number] Arrival date & time: 03/12/21  1420     History Chief Complaint  Patient presents with  . Abdominal Pain    Steven Hale. is a 65 y.o. male.  65 year old male with a chief complaints of right-sided abdominal pain.  Going on for a few days now.  Feels like a pinching and seems to come and go.  Worse when he tries to lay back flat to go to sleep.  He does get better when he forgets about it or when he is up ambulating.  He recently had a vascular procedure performed.  He also was seen in the ED a couple days ago for the same issue.  Had a CT scan performed and saw his vascular surgeon yesterday.  He is frustrated because has been having trouble sleeping.  Has an appointment with GI but in a month's time.  The history is provided by the patient.  Abdominal Pain Pain location:  R flank Pain quality: sharp and shooting   Pain radiates to:  Does not radiate Pain severity:  Moderate Onset quality:  Gradual Duration:  3 days Timing:  Intermittent Progression:  Waxing and waning Chronicity:  New Relieved by:  Nothing Worsened by:  Nothing Ineffective treatments:  None tried Associated symptoms: no chest pain, no chills, no diarrhea, no fever, no shortness of breath and no vomiting        Past Medical History:  Diagnosis Date  . Anemia, unspecified   . Bell's palsy    resolved - no deficits  . Blood transfusion    during treatment for Ca  . Blood transfusion without reported diagnosis   . CAD (coronary artery disease)    a. s/p multiple PCIs;  b. s/p CABG in 09/2010 (LIMA-LAD, SVG-OM1, SVG-distal RCA/OM2);  Cardiac cath in 12/2015: Mild LAD disease, occluded mid LCX and proximal RCA (left to right collaterals), Occluded SVG to RCA and atretic LIMA. Patent SVG to OM  . Chronic back pain   . Diabetes mellitus without complication ([place])    type 2  . DJD (degenerative joint disease)    low back &  all over   . GERD (gastroesophageal reflux disease)   . Helicobacter pylori (H. pylori) infection   . Hemorrhoids   . Hyperlipidemia   . Hypertension   . Impotence of organic origin   . Myocardial infarction ([place])    2005 and 2012   . PAD (peripheral artery disease) ([place])    a. s/p bilat SFA stents;  b. ABIs 11/2012: R 0.99, L 0.86.  [name] Pancreatic cancer [facility])    surgery 2009  . Pituitary adenoma [facility])    surgery at [facility] Feb 2020  . Sleep apnea 06/05/2019   does not use cpap    Patient Active Problem List   Diagnosis Date Noted  . Chronic abdominal pain 05/28/2020  . Solitary pulmonary nodule present on computed tomography of lung 05/27/2020  . Sleep apnea 06/05/2019  . Coronary artery disease involving coronary bypass graft of native heart   . Pre-diabetes 08/16/2015  . Peripheral neuropathy 07/09/2015  . Other male erectile dysfunction 03/07/2015  . Essential hypertension, benign 03/07/2015  . Pure hypercholesterolemia 12/10/2014  . Essential hypertension 12/10/2014  . S/P selective transsphenoidal pituitary adenomectomy 11/19/2014  . Lumbar disc herniation with radiculopathy 10/15/2014  . Chronic back pain 10/12/2014  . H/O pancreatic cancer 2009 10/12/2014  . Iron deficiency anemia 06/11/2014  . Testosterone deficiency  09/07/2013  . Benign paroxysmal positional vertigo 08/30/2013  . Non-insulin dependent type 2 diabetes mellitus ([place]) 08/30/2013  . PAD (peripheral artery disease) ([place]) 06/02/2012  . Claudication ([place]) 05/14/2011  . PVD (peripheral vascular disease) ([place]) 01/08/2009  . BELL'S PALSY, RIGHT 12/25/2008  . Dyslipidemia, goal LDL below 70 06/05/2008  . Hx of CABG 2011 11/17/2003    Past Surgical History:  Procedure Laterality Date  . ABDOMINAL AORTAGRAM N/A 09/13/2013   Procedure: ABDOMINAL Maxcine Ham;  Surgeon: Wellington Hampshire, MD;  Location: [facility] CATH LAB;  Service: Cardiovascular;  Laterality: N/A;  . ABDOMINAL AORTAGRAM N/A 08/29/2014   Procedure:  ABDOMINAL Maxcine Ham;  Surgeon: Wellington Hampshire, MD;  Location: [place] [facility] CATH LAB;  Service: Cardiovascular;  Laterality: N/A;  . ABDOMINAL AORTOGRAM W/LOWER EXTREMITY N/A 02/19/2021   Procedure: ABDOMINAL AORTOGRAM W/LOWER EXTREMITY;  Surgeon: Wellington Hampshire, MD;  Location: [facility] CV LAB;  Service: Cardiovascular;  Laterality: N/A;  . BACK SURGERY     1992  . CARDIAC CATHETERIZATION N/A 12/25/2015   Procedure: Left Heart Cath and Cors/Grafts Angiography;  Surgeon: Wellington Hampshire, MD;  Location: [place] CV LAB;  Service: Cardiovascular;  Laterality: N/A;  . COLONOSCOPY    . CORONARY ARTERY BYPASS GRAFT  nov 2011   x 4  . ENDARTERECTOMY FEMORAL Right 03/03/2021   Procedure: RIGHT FEMORAL ENDARTERECTOMY;  Surgeon: Cherre Robins, MD;  Location: [facility] OR;  Service: Vascular;  Laterality: Right;  . ESOPHAGOGASTRODUODENOSCOPY (EGD) WITH PROPOFOL N/A 04/02/2017   Procedure: ESOPHAGOGASTRODUODENOSCOPY (EGD) WITH PROPOFOL;  Surgeon: Carol Ada, MD;  Location: [facility] ENDOSCOPY;  Service: Endoscopy;  Laterality: N/A;  . HEMORRHOID SURGERY  10/30/2011   Procedure: HEMORRHOIDECTOMY;  Surgeon: Harl Bowie, MD;  Location: [facility];  Service: General;  Laterality: N/A;  . JOINT REPLACEMENT    . pancreatic  cancer  05/10/2008   Resection of distal panrease and spleen  . PARTIAL KNEE ARTHROPLASTY Right 11/19/2014   Procedure: RIGHT UNI-KNEE ARTHROPLASTY MEDIALLY;  Surgeon: Mauri Pole, MD;  Location: [facility] ORS;  Service: Orthopedics;  Laterality: Right;  . PATCH ANGIOPLASTY Right 03/03/2021   Procedure: PATCH ANGIOPLASTY;  Surgeon: Cherre Robins, MD;  Location: [facility];  Service: Vascular;  Laterality: Right;  . PERIPHERAL VASCULAR CATHETERIZATION  Oct 2014   Lt SFA PTA  . PERIPHERAL VASCULAR CATHETERIZATION  Oct 2015   ISR Lt SFA-PTA  . right partial knee replacement    . splenectomy    . THROMBECTOMY FEMORAL ARTERY Left 09/13/2013   Procedure: THROMBECTOMY FEMORAL ARTERY;  Surgeon: Wellington Hampshire,  MD;  Location: [facility] CATH LAB;  Service: Cardiovascular;  Laterality: Left;  . TRANSPHENOIDAL / TRANSNASAL HYPOPHYSECTOMY / RESECTION PITUITARY TUMOR  12/2018   [facility]       Family History  Problem Relation Age of Onset  . Heart attack Father        died of MI at age 65  . Cancer Father   . Anesthesia problems Neg Hx   . Hypotension Neg Hx   . Malignant hyperthermia Neg Hx   . Pseudochol deficiency Neg Hx   . Colon cancer Neg Hx   . Esophageal cancer Neg Hx   . Prostate cancer Neg Hx   . Rectal cancer Neg Hx   . Stomach cancer Neg Hx     Social History   Tobacco Use  . Smoking status: Former Smoker    Packs/day: 1.00    Years: 18.00    Pack years: 18.00  Types: Cigarettes    Quit date: 06/2019    Years since quitting: 1.7  . Smokeless tobacco: Never Used  Vaping Use  . Vaping Use: Never used  Substance Use Topics  . Alcohol use: Not Currently    Alcohol/week: 3.0 standard drinks    Types: 3 Shots of liquor per week    Comment: 2 drinks of brandy every week   . Drug use: No    Home Medications Prior to Admission medications   Medication Sig Start Date End Date Taking? Authorizing Provider  morphine (MSIR) 15 MG tablet Take 0.5 tablets (7.5 mg total) by mouth every 4 (four) hours as needed for severe pain. 03/12/21  Yes Deno Etienne, DO  predniSONE (DELTASONE) 20 MG tablet Take 2 tablets by mouth daily x 4 days 03/12/21  Yes Deno Etienne, DO  valACYclovir (VALTREX) 1000 MG tablet Take 1 tablet (1,000 mg total) by mouth 3 (three) times daily. 03/12/21  Yes Deno Etienne, DO  amoxicillin (AMOXIL) 500 MG capsule TAKE 4 CAPSULES BY MOUTH 1 HOUR PRIOR TO DENTAL APPOINTMENT 01/31/21 01/31/22    aspirin EC 81 MG tablet Take 1 tablet (81 mg total) by mouth daily. 11/19/14   Danae Orleans, PA-C  atorvastatin (LIPITOR) 40 MG tablet Take 1 tablet (40 mg total) by mouth daily. Patient taking differently: Take 40 mg by mouth at bedtime. 07/07/16   Wellington Hampshire, MD  DULoxetine HCl 30 MG  CSDR Take 30 mg by mouth 2 (two) times daily.    [provider]  ezetimibe (ZETIA) 10 MG tablet TAKE 1 TABLET BY MOUTH ONCE DAILY 07/30/20 07/30/21  Wellington Hampshire, MD  fish oil-omega-3 fatty acids 1000 MG capsule Take 1 capsule (1 g total) by mouth daily. 11/30/12   Rosana Hoes, MD  levothyroxine (SYNTHROID) 25 MCG tablet Take 25 mcg by mouth daily. 01/30/19   [provider]  lisinopril (ZESTRIL) 2.5 MG tablet Take 2.5 mg by mouth daily. 08/19/20   [provider]  metFORMIN (GLUCOPHAGE) 500 MG tablet Take 1 tablet (500 mg total) by mouth daily with breakfast. Patient taking differently: Take 500 mg by mouth 2 (two) times daily with a meal. 12/15/17   Delia Chimes A, MD  metoprolol tartrate (LOPRESSOR) 25 MG tablet Take 1 tablet by mouth two  times daily Patient taking differently: Take 25 mg by mouth 2 (two) times daily. 09/18/16   Lelon Perla, MD  Multiple Vitamin (MULTIVITAMIN) tablet Take 1 tablet by mouth daily. 11/30/12   Rosana Hoes, MD  nitroGLYCERIN (NITROSTAT) 0.4 MG SL tablet Place 0.4 mg under the tongue every 2 (two) hours as needed for chest pain. 10/28/16   [provider]  oxyCODONE-acetaminophen (PERCOCET) 5-325 MG tablet Take 1 tablet by mouth every 6 (six) hours as needed. 03/05/21   Dagoberto Ligas, PA-C  pantoprazole (PROTONIX) 40 MG tablet TAKE 1 TABLET BY MOUTH ONCE DAILY 09/30/20 09/30/21  Wellington Hampshire, MD  polyethylene glycol (MIRALAX / GLYCOLAX) 17 g packet Take 17 g by mouth daily as needed for mild constipation or moderate constipation.    [provider]  pregabalin (LYRICA) 150 MG capsule Take 1 capsule (150 mg total) by mouth daily. Patient taking differently: Take 300 mg by mouth 2 (two) times daily. 05/02/18   [name], MD  Propylene Glycol (SYSTANE BALANCE) 0.6 % SOLN Apply 1 drop to eye 4 (four) times daily.    [provider]  sildenafil (VIAGRA) 100 MG tablet Take 0.5-1 tablets (50-100  mg  total) by mouth daily as needed for erectile dysfunction. 08/25/18   Horald Pollen, MD  tamsulosin (FLOMAX) 0.4 MG CAPS capsule TAKE 1 CAPSULE BY MOUTH DAILY. Patient taking differently: Take 0.4 mg by mouth at bedtime. 07/23/20 07/23/21  Daleen Squibb, MD    Allergies    Patient has no known allergies.  Review of Systems   Review of Systems  Constitutional: Negative for chills and fever.  HENT: Negative for congestion and facial swelling.   Eyes: Negative for discharge and visual disturbance.  Respiratory: Negative for shortness of breath.   Cardiovascular: Negative for chest pain and palpitations.  Gastrointestinal: Positive for abdominal pain. Negative for diarrhea and vomiting.  Musculoskeletal: Negative for arthralgias and myalgias.  Skin: Positive for rash. Negative for color change.  Neurological: Negative for tremors, syncope and headaches.  Psychiatric/Behavioral: Negative for confusion and dysphoric mood.    Physical Exam Updated Vital Signs BP 110/68 (BP Location: Left Arm)   Pulse 66   Temp 98.4 F (36.9 C) (Oral)   Resp 18   SpO2 93%   Physical Exam Vitals and nursing note reviewed.  Constitutional:      Appearance: He is well-developed.  HENT:     Head: Normocephalic and atraumatic.  Eyes:     Pupils: Pupils are equal, round, and reactive to light.  Neck:     Vascular: No JVD.  Cardiovascular:     Rate and Rhythm: Normal rate and regular rhythm.     Heart sounds: No murmur heard. No friction rub. No gallop.   Pulmonary:     Effort: No respiratory distress.     Breath sounds: No wheezing.  Abdominal:     General: There is no distension.     Tenderness: There is no abdominal tenderness. There is no guarding or rebound.     Comments: Vesicular appearing lesions in a dermatomal distribution about his right flank.  Does not cross the midline.  Musculoskeletal:        General: Normal range of motion.     Cervical back: Normal range of motion  and neck supple.  Skin:    Coloration: Skin is not pale.     Findings: No rash.  Neurological:     Mental Status: He is alert and oriented to person, place, and time.  Psychiatric:        Behavior: Behavior normal.     ED Results / Procedures / Treatments   Labs (all labs ordered are listed, but only abnormal results are displayed) Labs Reviewed - No data to display  EKG None  Radiology No results found.  Procedures Procedures   Medications Ordered in ED Medications  predniSONE (DELTASONE) tablet 60 mg (has no administration in time range)    ED Course  I have reviewed the triage vital signs and the nursing notes.  Pertinent labs & imaging results that were available during my care of the patient were reviewed by me and considered in my medical decision making (see chart for details).    MDM Rules/Calculators/A&P                          65 yo M here with right-sided abdominal pain.  Going on for a few days.  Recently had a vascular surgery performed.  Was seen a couple days ago in the ED and had a CT angiogram performed that was negative for acute pathology.  Saw his vascular surgeon yesterday  who referred him to gastroenterology.  On my exam the patient's presentation is most consistent with shingles he has a one-sided vesicular appearing rash.  We will start him on Valtrex and steroids.  Have him follow-up with his family doctor in the office.  3:49 PM:  I have discussed the diagnosis/risks/treatment options with the patient and believe the pt to be eligible for discharge home to follow-up with PCP. We also discussed returning to the ED immediately if new or worsening sx occur. We discussed the sx which are most concerning (e.g., sudden worsening pain, fever, inability to tolerate by mouth) that necessitate immediate return. Medications administered to the patient during their visit and any new prescriptions provided to the patient are listed below.  Medications given  during this visit Medications  predniSONE (DELTASONE) tablet 60 mg (has no administration in time range)     The patient appears reasonably screen and/or stabilized for discharge and I doubt any other medical condition or other [facility] requiring further screening, evaluation, or treatment in the ED at this time prior to discharge.   Final Clinical Impression(s) / ED Diagnoses Final diagnoses:  Herpes zoster without complication    Rx / DC Orders ED Discharge Orders         Ordered    predniSONE (DELTASONE) 20 MG tablet        03/12/21 1544    morphine (MSIR) 15 MG tablet  Every 4 hours PRN        03/12/21 1544    valACYclovir (VALTREX) 1000 MG tablet  3 times daily        03/12/21 [place], [place], DO 03/12/21 1549

## 2021-03-12 NOTE — Discharge Instructions (Signed)
Follow-up with your doctor in the office.  Take the medications as prescribed.  Either take the Percocet or the morphine tablets I prescribed do not mix them.

## 2021-03-12 NOTE — ED Triage Notes (Signed)
Emergency Medicine Provider Triage Evaluation Note  Steven Hale. , a 65 y.o. male  was evaluated in triage.  Pt complains of abdominal pain, was seen two days ago for same. States that he had a right femoral endarterectomy with patch angioplasty, pain since then. Saw surgeon yesterday for pain relief, percocet isnt working.   Review of Systems  Positive: Abdominal pain Negative: Fevers, vomiting, diarrhea   Physical Exam  BP 110/68 (BP Location: Left Arm)   Pulse 66   Temp 98.4 F (36.9 C) (Oral)   Resp 18   SpO2 93%  Gen:   Awake, no distress   HEENT:  Atraumatic  Resp:  Normal effort  Cardiac:  Normal rate  Abd:   Nondistended, nontender  MSK:   Moves extremities without difficulty  Neuro:  Speech clear   Medical Decision Making  Medically screening exam initiated at 2:41 PM.  Appropriate orders placed.  [name] was informed that the remainder of the evaluation will be completed by another provider, this initial triage assessment does not replace that evaluation, and the importance of remaining in the ED until their evaluation is complete.  Clinical Impression  Stable  MSE was initiated and I personally evaluated the patient and placed orders (if any) at  2:42 PM on March 12, 2021.  The patient appears stable so that the remainder of the MSE may be completed by another provider.    Alfredia Client, PA-C 03/12/21 1450

## 2021-03-13 ENCOUNTER — Telehealth: Payer: Self-pay

## 2021-03-13 NOTE — Telephone Encounter (Signed)
I contacted Mr. Fassnacht to discuss with whom to schedule his GI consult (Dr. Pearla Dubonnet or a different provider).  The patient requested that the referral be cancelled.  He reports that he was seen in the ED on 4/27 and diagnosed with shingles.  He received medication and sx are much improved.  Referral canceled.  Dr. Stanford Breed will be made aware.  Patient will keep his appointment with Dr .Stanford Breed as scheduled on 04/10/21.

## 2021-03-21 ENCOUNTER — Other Ambulatory Visit: Payer: Self-pay | Admitting: *Deleted

## 2021-03-21 DIAGNOSIS — I739 Peripheral vascular disease, unspecified: Secondary | ICD-10-CM

## 2021-03-27 ENCOUNTER — Other Ambulatory Visit (HOSPITAL_COMMUNITY): Payer: Self-pay

## 2021-03-27 ENCOUNTER — Other Ambulatory Visit: Payer: Self-pay | Admitting: Emergency Medicine

## 2021-03-27 MED ORDER — PREDNISONE 20 MG PO TABS
ORAL_TABLET | ORAL | 0 refills | Status: DC
  Filled 2021-03-27: qty 8, 4d supply, fill #0

## 2021-03-27 MED FILL — Pantoprazole Sodium EC Tab 40 MG (Base Equiv): ORAL | 30 days supply | Qty: 30 | Fill #1 | Status: AC

## 2021-03-28 ENCOUNTER — Other Ambulatory Visit (HOSPITAL_COMMUNITY): Payer: Self-pay

## 2021-04-01 ENCOUNTER — Ambulatory Visit: Payer: Medicare Other | Admitting: Cardiovascular Disease

## 2021-04-01 ENCOUNTER — Encounter (HOSPITAL_COMMUNITY): Payer: Medicare Other

## 2021-04-03 ENCOUNTER — Other Ambulatory Visit (INDEPENDENT_AMBULATORY_CARE_PROVIDER_SITE_OTHER): Payer: Self-pay

## 2021-04-10 ENCOUNTER — Ambulatory Visit (HOSPITAL_COMMUNITY)
Admission: RE | Admit: 2021-04-10 | Discharge: 2021-04-10 | Disposition: A | Discharge status: Home / Self Care | Payer: Medicare Other | Source: Ambulatory Visit | Attending: Vascular Surgery | Admitting: Vascular Surgery

## 2021-04-10 ENCOUNTER — Other Ambulatory Visit: Payer: Self-pay

## 2021-04-10 ENCOUNTER — Ambulatory Visit (INDEPENDENT_AMBULATORY_CARE_PROVIDER_SITE_OTHER): Payer: Medicare Other | Admitting: Physician Assistant

## 2021-04-10 VITALS — BP 113/69 | HR 51 | Temp 97.7°F | Resp 20 | Ht 69.0 in | Wt 230.4 lb

## 2021-04-10 DIAGNOSIS — K8689 Other specified diseases of pancreas: Secondary | ICD-10-CM | POA: Insufficient documentation

## 2021-04-10 DIAGNOSIS — R635 Abnormal weight gain: Secondary | ICD-10-CM | POA: Insufficient documentation

## 2021-04-10 DIAGNOSIS — I739 Peripheral vascular disease, unspecified: Secondary | ICD-10-CM

## 2021-04-10 DIAGNOSIS — R141 Gas pain: Secondary | ICD-10-CM | POA: Insufficient documentation

## 2021-04-10 NOTE — Progress Notes (Signed)
POST OPERATIVE OFFICE NOTE    CC:  F/u for surgery  HPI:  This is a 65 y.o. male who is s/p right femoral endarterectomy on 03/03/2021 by Dr. Stanford Breed for RLE disabling claudication.  He returned to th eER on 03/09/2021 for RLQ and flank pain.  CTA showed a small post op hematoma in the groin but Dr. Stanford Breed did not feel that explained his abdominal pain.  He did not have any residual stenosis in the CFA.  He was referred to GI for his abdominal pain and scheduled for one month f/u with ABI.  When he was seen in the ER, he was diagnosed with shingles and given medication and his sx improved.  The referral was cancelled.  Pt returns today for follow up ABI's.  Pt states he is doing well and his claudication has resolved.  He states he is able to run and exercise.  He inquires about swimming.  He quit smoking a few years ago.  He states his shingles pain is better but still present and inquires about pain medication.   No Known Allergies  Current Outpatient Medications  Medication Sig Dispense Refill  . amoxicillin (AMOXIL) 500 MG capsule TAKE 4 CAPSULES BY MOUTH 1 HOUR PRIOR TO DENTAL APPOINTMENT 20 capsule 1  . aspirin EC 81 MG tablet Take 1 tablet (81 mg total) by mouth daily.    [name] atorvastatin (LIPITOR) 40 MG tablet Take 1 tablet (40 mg total) by mouth daily. (Patient taking differently: Take 40 mg by mouth at bedtime.) 90 tablet 3  . DULoxetine HCl 30 MG CSDR Take 30 mg by mouth 2 (two) times daily.    [name] ezetimibe (ZETIA) 10 MG tablet TAKE 1 TABLET BY MOUTH ONCE DAILY 90 tablet 3  . fish oil-omega-3 fatty acids 1000 MG capsule Take 1 capsule (1 g total) by mouth daily. 60 capsule 1  . levothyroxine (SYNTHROID) 25 MCG tablet Take 25 mcg by mouth daily.    [name] lisinopril (ZESTRIL) 2.5 MG tablet Take 2.5 mg by mouth daily.    . metFORMIN (GLUCOPHAGE) 500 MG tablet Take 1 tablet (500 mg total) by mouth daily with breakfast. (Patient taking differently: Take 500 mg by mouth 2 (two) times daily with a  meal.) 90 tablet 3  . metoprolol tartrate (LOPRESSOR) 25 MG tablet Take 1 tablet by mouth two  times daily (Patient taking differently: Take 25 mg by mouth 2 (two) times daily.) 60 tablet 0  . morphine (MSIR) 15 MG tablet Take 0.5 tablets (7.5 mg total) by mouth every 4 (four) hours as needed for severe pain. 7 tablet 0  . Multiple Vitamin (MULTIVITAMIN) tablet Take 1 tablet by mouth daily. 30 tablet 10  . nitroGLYCERIN (NITROSTAT) 0.4 MG SL tablet Place 0.4 mg under the tongue every 2 (two) hours as needed for chest pain.    [name] oxyCODONE-acetaminophen (PERCOCET) 5-325 MG tablet Take 1 tablet by mouth every 6 (six) hours as needed. 20 tablet 0  . pantoprazole (PROTONIX) 40 MG tablet TAKE 1 TABLET BY MOUTH ONCE DAILY 30 tablet 6  . polyethylene glycol (MIRALAX / GLYCOLAX) 17 g packet Take 17 g by mouth daily as needed for mild constipation or moderate constipation.    . predniSONE (DELTASONE) 20 MG tablet Take 2 tablets by mouth daily x 4 days 8 tablet 0  . pregabalin (LYRICA) 150 MG capsule Take 1 capsule (150 mg total) by mouth daily. (Patient taking differently: Take 300 mg by mouth 2 (two) times daily.) 30  capsule 0  . Propylene Glycol (SYSTANE BALANCE) 0.6 % SOLN Apply 1 drop to eye 4 (four) times daily.    . sildenafil (VIAGRA) 100 MG tablet Take 0.5-1 tablets (50-100 mg total) by mouth daily as needed for erectile dysfunction. 5 tablet 11  . tamsulosin (FLOMAX) 0.4 MG CAPS capsule TAKE 1 CAPSULE BY MOUTH DAILY. (Patient taking differently: Take 0.4 mg by mouth at bedtime.) 30 capsule 2  . valACYclovir (VALTREX) 1000 MG tablet Take 1 tablet (1,000 mg total) by mouth 3 (three) times daily. 21 tablet 0   No current facility-administered medications for this visit.     ROS:  See HPI  Physical Exam:  Today's Vitals   04/10/21 1024  BP: 113/69  Pulse: (!) 51  Resp: 20  Temp: 97.7 F (36.5 C)  TempSrc: Temporal  SpO2: 98%  Weight: 230 lb 6.4 oz (104.5 kg)  Height: 5\' 9"  (1.753 m)    Body mass index is 34.02 kg/m.   Incision:  Healed nicely.  Remaining 3 steri strips removed. Extremities:  Brisk monophasic doppler signals right DP/PT and palpable left DP pulse.    ABI/TBI 04/10/2021: Right:  0.98/0.50 with toe pressure of 60 Left:  0.97/0.74 with toe pressure of 89   Assessment/Plan:  This is a 64 y.o. male who is s/p: right femoral endarterectomy on 03/03/2021 by Dr. Stanford Breed.  -pt doing well since surgery.  His claudication has resolved and improvement of his ABI.   His incision is healing nicely.   -pt has been followed by Dr. Fletcher Anon for his PAD.  Pt has hx of bilateral SFA stents as well.  Scheduled pt for 6 month BLE arterial duplex and ABI, but discussed with pt that if Dr. Fletcher Anon continues to follow this, he can cancel this appointment-I just didn't want him to get lost to follow up.   -otherwise, pt will call sooner if he develops rest pain or non healing wounds. -discussed that he can resume swimming in a couple of weeks to give his incision a little more time. -he also inquired about pain medication for his shingles.  Discussed that he should talk with his pain management clinic as this pain is not related to his surgical procedure.  He expressed understanding. -continue asa/statin   Leontine Locket, [facility] [phone]   Clinic MD:  Scot Dock

## 2021-04-16 ENCOUNTER — Other Ambulatory Visit: Payer: Self-pay

## 2021-04-16 DIAGNOSIS — I739 Peripheral vascular disease, unspecified: Secondary | ICD-10-CM

## 2021-04-23 ENCOUNTER — Other Ambulatory Visit (HOSPITAL_COMMUNITY): Payer: Self-pay

## 2021-04-23 MED FILL — Ezetimibe Tab 10 MG: ORAL | 90 days supply | Qty: 90 | Fill #0 | Status: AC

## 2021-04-27 ENCOUNTER — Other Ambulatory Visit: Payer: Self-pay | Admitting: Cardiovascular Disease

## 2021-04-28 ENCOUNTER — Other Ambulatory Visit (HOSPITAL_COMMUNITY): Payer: Self-pay

## 2021-04-28 MED ORDER — PANTOPRAZOLE SODIUM 40 MG PO TBEC
DELAYED_RELEASE_TABLET | Freq: Every day | ORAL | 6 refills | Status: DC
  Filled 2021-04-28: qty 30, 30d supply, fill #0
  Filled 2021-05-27: qty 30, 30d supply, fill #1
  Filled 2021-06-28: qty 30, 30d supply, fill #2
  Filled 2021-07-12: qty 30, 30d supply, fill #3
  Filled 2021-08-26: qty 30, 30d supply, fill #4
  Filled 2021-09-29: qty 30, 30d supply, fill #5
  Filled 2021-10-27: qty 30, 30d supply, fill #6

## 2021-04-28 NOTE — Telephone Encounter (Signed)
Refill request

## 2021-05-27 ENCOUNTER — Other Ambulatory Visit (HOSPITAL_COMMUNITY): Payer: Self-pay

## 2021-06-28 ENCOUNTER — Other Ambulatory Visit (HOSPITAL_COMMUNITY): Payer: Self-pay

## 2021-07-08 ENCOUNTER — Other Ambulatory Visit: Payer: Self-pay

## 2021-07-08 ENCOUNTER — Other Ambulatory Visit (HOSPITAL_COMMUNITY): Payer: Self-pay

## 2021-07-08 ENCOUNTER — Ambulatory Visit
Admission: EM | Admit: 2021-07-08 | Discharge: 2021-07-08 | Disposition: A | Discharge status: Home / Self Care | Payer: Medicare Other | Attending: Emergency Medicine | Admitting: Emergency Medicine

## 2021-07-08 DIAGNOSIS — E889 Metabolic disorder, unspecified: Secondary | ICD-10-CM | POA: Diagnosis not present

## 2021-07-08 DIAGNOSIS — G63 Polyneuropathy in diseases classified elsewhere: Secondary | ICD-10-CM | POA: Diagnosis not present

## 2021-07-08 MED ORDER — KETOROLAC TROMETHAMINE 30 MG/ML IJ SOLN
30.0000 mg | Freq: Once | INTRAMUSCULAR | Status: AC
  Administered 2021-07-08: 30 mg via INTRAMUSCULAR

## 2021-07-08 MED ORDER — NAPROXEN 500 MG PO TABS
500.0000 mg | ORAL_TABLET | Freq: Two times a day (BID) | ORAL | 0 refills | Status: DC
  Filled 2021-07-08: qty 30, 15d supply, fill #0

## 2021-07-08 NOTE — ED Provider Notes (Signed)
[facility]    CSN: [id number] Arrival date & time: 07/08/21  0845      History   Chief Complaint Chief Complaint  Patient presents with   Toe Pain    HPI Ether Wolters. is a 65 y.o. male history of CAD, DM type II, hypertension, hyperlipidemia, PAD, presenting today for evaluation of foot pain.  Reports burning sensation in bilateral feet/toes over many months.  Recently worsening.  Typically pain worse in the evening as well as in the morning.  Denies injury or trauma.  Has plans to have epidural steroid injection next week to help with his low back pain which he does have radicular symptoms with.  Also has known peripheral neuropathy from diabetes, is on duloxetine and Lyrica.  HPI  Past Medical History:  Diagnosis Date   Anemia, unspecified    Bell's palsy    resolved - no deficits   Blood transfusion    during treatment for Ca   Blood transfusion without reported diagnosis    CAD (coronary artery disease)    a. s/p multiple PCIs;  b. s/p CABG in 09/2010 (LIMA-LAD, SVG-OM1, SVG-distal RCA/OM2);  Cardiac cath in 12/2015: Mild LAD disease, occluded mid LCX and proximal RCA (left to right collaterals), Occluded SVG to RCA and atretic LIMA. Patent SVG to OM   Chronic back pain    Diabetes mellitus without complication (HCC)    type 2   DJD (degenerative joint disease)    low back & all over    GERD (gastroesophageal reflux disease)    Helicobacter pylori (H. pylori) infection    Hemorrhoids    Hyperlipidemia    Hypertension    Impotence of organic origin    Myocardial infarction ([place])    2005 and 2012    PAD (peripheral artery disease) ([place])    a. s/p bilat SFA stents;  b. ABIs 11/2012: R 0.99, L 0.86.   Pancreatic cancer [facility])    surgery 2009   Pituitary adenoma [facility])    surgery at [facility] Feb 2020   Sleep apnea 06/05/2019   does not use cpap    Patient Active Problem List   Diagnosis Date Noted   Abnormal weight gain 04/10/2021   Flatulence,  eructation and gas pain 04/10/2021   Pancreatic insufficiency 04/10/2021   DDD (degenerative disc disease), lumbar 05/30/2020   Chronic abdominal pain 05/28/2020   Solitary pulmonary nodule present on computed tomography of lung 05/27/2020   Sleep apnea 06/05/2019   Hypothyroidism 01/04/2019   Cocaine abuse ([place]) 01/02/2019   Pituitary macroadenoma with extrasellar extension ([place]) 12/13/2018   Coronary artery disease involving coronary bypass graft of native heart    Pre-diabetes 08/16/2015   Peripheral neuropathy 07/09/2015   Other male erectile dysfunction 03/07/2015   Essential hypertension, benign 03/07/2015   Pure hypercholesterolemia 12/10/2014   Essential hypertension 12/10/2014   S/P selective transsphenoidal pituitary adenomectomy 11/19/2014   Lumbar disc herniation with radiculopathy 10/15/2014   Chronic back pain 10/12/2014   H/O pancreatic cancer 2009 10/12/2014   Iron deficiency anemia 06/11/2014   Testosterone deficiency 09/07/2013   Benign paroxysmal positional vertigo 08/30/2013   Non-insulin dependent type 2 diabetes mellitus ([place]) 08/30/2013   PAD (peripheral artery disease) ([place]) 06/02/2012   Claudication ([place]) 05/14/2011   PVD (peripheral vascular disease) ([place]) 01/08/2009   BELL'S PALSY, RIGHT 12/25/2008   Dyslipidemia, goal LDL below 70 06/05/2008   Hx of CABG 2011 11/17/2003    Past Surgical History:  Procedure Laterality Date  ABDOMINAL AORTAGRAM N/A 09/13/2013   Procedure: ABDOMINAL Maxcine Ham;  Surgeon: Wellington Hampshire, MD;  Location: [place] CATH LAB;  Service: Cardiovascular;  Laterality: N/A;   ABDOMINAL AORTAGRAM N/A 08/29/2014   Procedure: ABDOMINAL Maxcine Ham;  Surgeon: Wellington Hampshire, MD;  Location: [place] CATH LAB;  Service: Cardiovascular;  Laterality: N/A;   ABDOMINAL AORTOGRAM W/LOWER EXTREMITY N/A 02/19/2021   Procedure: ABDOMINAL AORTOGRAM W/LOWER EXTREMITY;  Surgeon: Wellington Hampshire, MD;  Location: [place] CV LAB;  Service: Cardiovascular;   Laterality: N/A;   BACK SURGERY     1992   CARDIAC CATHETERIZATION N/A 12/25/2015   Procedure: Left Heart Cath and Cors/Grafts Angiography;  Surgeon: Wellington Hampshire, MD;  Location: [place] CV LAB;  Service: Cardiovascular;  Laterality: N/A;   COLONOSCOPY     CORONARY ARTERY BYPASS GRAFT  nov 2011   x 4   ENDARTERECTOMY FEMORAL Right 03/03/2021   Procedure: RIGHT FEMORAL ENDARTERECTOMY;  Surgeon: Cherre Robins, MD;  Location: [place];  Service: Vascular;  Laterality: Right;   ESOPHAGOGASTRODUODENOSCOPY (EGD) WITH PROPOFOL N/A 04/02/2017   Procedure: ESOPHAGOGASTRODUODENOSCOPY (EGD) WITH PROPOFOL;  Surgeon: Carol Ada, MD;  Location: [facility];  Service: Endoscopy;  Laterality: N/A;   HEMORRHOID SURGERY  10/30/2011   Procedure: HEMORRHOIDECTOMY;  Surgeon: Harl Bowie, MD;  Location: [place];  Service: General;  Laterality: N/A;   JOINT REPLACEMENT     pancreatic  cancer  05/10/2008   Resection of distal panrease and spleen   PARTIAL KNEE ARTHROPLASTY Right 11/19/2014   Procedure: RIGHT UNI-KNEE ARTHROPLASTY MEDIALLY;  Surgeon: Mauri Pole, MD;  Location: [facility];  Service: Orthopedics;  Laterality: Right;   PATCH ANGIOPLASTY Right 03/03/2021   Procedure: PATCH ANGIOPLASTY;  Surgeon: Cherre Robins, MD;  Location: [place];  Service: Vascular;  Laterality: Right;   PERIPHERAL VASCULAR CATHETERIZATION  Oct 2014   Lt SFA PTA   PERIPHERAL VASCULAR CATHETERIZATION  Oct 2015   ISR Lt SFA-PTA   right partial knee replacement     splenectomy     THROMBECTOMY FEMORAL ARTERY Left 09/13/2013   Procedure: THROMBECTOMY FEMORAL ARTERY;  Surgeon: Wellington Hampshire, MD;  Location: [place] CATH LAB;  Service: Cardiovascular;  Laterality: Left;   TRANSPHENOIDAL / TRANSNASAL HYPOPHYSECTOMY / RESECTION PITUITARY TUMOR  12/2018   [facility]       Home Medications    Prior to Admission medications   Medication Sig Start Date End Date Taking? Authorizing Provider  aspirin EC 81 MG tablet Take 1 tablet  (81 mg total) by mouth daily. 11/19/14  Yes Babish, Rodman Key, PA-C  DULoxetine HCl 30 MG CSDR Take 30 mg by mouth 2 (two) times daily.   Yes [provider]  ezetimibe (ZETIA) 10 MG tablet TAKE 1 TABLET BY MOUTH ONCE DAILY 07/30/20 07/30/21 Yes Wellington Hampshire, MD  fish oil-omega-3 fatty acids 1000 MG capsule Take 1 capsule (1 g total) by mouth daily. 11/30/12  Yes Rosana Hoes, MD  levothyroxine (SYNTHROID) 25 MCG tablet Take 25 mcg by mouth daily. 01/30/19  Yes [provider]  lisinopril (ZESTRIL) 2.5 MG tablet Take 2.5 mg by mouth daily. 08/19/20  Yes [provider]  metFORMIN (GLUCOPHAGE) 500 MG tablet Take 1 tablet (500 mg total) by mouth daily with breakfast. Patient taking differently: Take 500 mg by mouth 2 (two) times daily with a meal. 12/15/17  Yes Stallings, Zoe A, MD  metoprolol tartrate (LOPRESSOR) 25 MG tablet Take 1 tablet by mouth two  times daily Patient taking differently:  Take 25 mg by mouth 2 (two) times daily. 09/18/16  Yes Lelon Perla, MD  pregabalin (LYRICA) 150 MG capsule Take 1 capsule (150 mg total) by mouth daily. Patient taking differently: Take 300 mg by mouth 2 (two) times daily. 05/02/18  Yes Stallings, Zoe A, MD  tamsulosin (FLOMAX) 0.4 MG CAPS capsule TAKE 1 CAPSULE BY MOUTH DAILY. Patient taking differently: Take 0.4 mg by mouth at bedtime. 07/23/20 07/23/21 Yes Jacelyn Pi, Lilia Argue, MD  amoxicillin (AMOXIL) 500 MG capsule TAKE 4 CAPSULES BY MOUTH 1 HOUR PRIOR TO DENTAL APPOINTMENT 01/31/21 01/31/22    atorvastatin (LIPITOR) 40 MG tablet Take 1 tablet (40 mg total) by mouth daily. Patient taking differently: Take 40 mg by mouth at bedtime. 07/07/16   Wellington Hampshire, MD  morphine (MSIR) 15 MG tablet Take 0.5 tablets (7.5 mg total) by mouth every 4 (four) hours as needed for severe pain. Patient not taking: Reported on 04/10/2021 03/12/21   Deno Etienne, DO  Multiple Vitamin (MULTIVITAMIN) tablet Take 1 tablet by mouth daily. 11/30/12   Rosana Hoes, MD  nitroGLYCERIN (NITROSTAT) 0.4 MG SL tablet Place 0.4 mg under the tongue every 2 (two) hours as needed for chest pain. 10/28/16   [provider]  oxyCODONE-acetaminophen (PERCOCET) 5-325 MG tablet Take 1 tablet by mouth every 6 (six) hours as needed. Patient not taking: Reported on 04/10/2021 03/05/21   Dagoberto Ligas, PA-C  pantoprazole (PROTONIX) 40 MG tablet TAKE 1 TABLET BY MOUTH ONCE DAILY 04/28/21 04/28/22  Wellington Hampshire, MD  polyethylene glycol (MIRALAX / GLYCOLAX) 17 g packet Take 17 g by mouth daily as needed for mild constipation or moderate constipation.    [provider]  predniSONE (DELTASONE) 20 MG tablet Take 2 tablets by mouth daily x 4 days Patient not taking: Reported on 04/10/2021 03/27/21   Deno Etienne, DO  Propylene Glycol (SYSTANE BALANCE) 0.6 % SOLN Apply 1 drop to eye 4 (four) times daily.    [provider]  sildenafil (VIAGRA) 100 MG tablet Take 0.5-1 tablets (50-100 mg total) by mouth daily as needed for erectile dysfunction. 08/25/18   Horald Pollen, MD  valACYclovir (VALTREX) 1000 MG tablet Take 1 tablet (1,000 mg total) by mouth 3 (three) times daily. 03/12/21   Deno Etienne, DO    Family History Family History  Problem Relation Age of Onset   Heart attack Father        died of MI at age 74   Cancer Father    Anesthesia problems Neg Hx    Hypotension Neg Hx    Malignant hyperthermia Neg Hx    Pseudochol deficiency Neg Hx    Colon cancer Neg Hx    Esophageal cancer Neg Hx    Prostate cancer Neg Hx    Rectal cancer Neg Hx    Stomach cancer Neg Hx     Social History Social History   Tobacco Use   Smoking status: Former    Packs/day: 1.00    Years: 18.00    Pack years: 18.00    Types: Cigarettes    Quit date: 06/2019    Years since quitting: 2.0   Smokeless tobacco: Never  Vaping Use   Vaping Use: Never used  Substance Use Topics   Alcohol use: Not Currently    Alcohol/week: 3.0 standard drinks     Types: 3 Shots of liquor per week    Comment: 2 drinks of brandy every week    Drug use: No  Allergies   Patient has no known allergies.   Review of Systems Review of Systems  Constitutional:  Negative for fatigue and fever.  Eyes:  Negative for redness, itching and visual disturbance.  Respiratory:  Negative for shortness of breath.   Cardiovascular:  Negative for chest pain and leg swelling.  Gastrointestinal:  Negative for nausea and vomiting.  Musculoskeletal:  Positive for arthralgias and gait problem. Negative for myalgias.  Skin:  Negative for color change, rash and wound.  Neurological:  Negative for dizziness, syncope, weakness, light-headedness and headaches.    Physical Exam Triage Vital Signs ED Triage Vitals  Enc Vitals Group     BP      Pulse      Resp      Temp      Temp src      SpO2      Weight      Height      Head Circumference      Peak Flow      Pain Score      Pain Loc      Pain Edu?      Excl. in [place]?    No data found.  Updated Vital Signs BP 118/74 (BP Location: Right Arm)   Pulse 66   Temp 97.8 F (36.6 C) (Oral)   Resp 20   SpO2 97%   Visual Acuity Right Eye Distance:   Left Eye Distance:   Bilateral Distance:    Right Eye Near:   Left Eye Near:    Bilateral Near:     Physical Exam Vitals and nursing note reviewed.  Constitutional:      Appearance: He is well-developed.     Comments: No acute distress  HENT:     Head: Normocephalic and atraumatic.     Nose: Nose normal.  Eyes:     Conjunctiva/sclera: Conjunctivae normal.  Cardiovascular:     Rate and Rhythm: Normal rate.  Pulmonary:     Effort: Pulmonary effort is normal. No respiratory distress.  Abdominal:     General: There is no distension.  Musculoskeletal:        General: Normal range of motion.     Cervical back: Neck supple.     Comments: Bilateral feet with out swelling or discoloration.  Full active range of motion of feet, dorsalis pedis 2+,  sensation diminished greater on left compared to right, no wounds or ulcers noted  Skin:    General: Skin is warm and dry.  Neurological:     Mental Status: He is alert and oriented to person, place, and time.     UC Treatments / Results  Labs (all labs ordered are listed, but only abnormal results are displayed) Labs Reviewed - No data to display  EKG   Radiology No results found.  Procedures Procedures (including critical care time)  Medications Ordered in UC Medications  ketorolac (TORADOL) 30 MG/ML injection 30 mg (30 mg Intramuscular Given 07/08/21 0941)    Initial Impression / Assessment and Plan / UC Course  I have reviewed the triage vital signs and the nursing notes.  Pertinent labs & imaging results that were available during my care of the patient were reviewed by me and considered in my medical decision making (see chart for details).     Peripheral neuropathy-likely secondary to diabetes, may have contributing pain from associated PAD and sciatica.  Already on Lyrica and Cymbalta, has plans to have epidural steroid injection, deferring any further oral  steroids.  Recommending use of anti-inflammatories as needed and following up with pain management as planned.  Toradol prior to discharge.  Discussed strict return precautions. Patient verbalized understanding and is agreeable with plan.  Final Clinical Impressions(s) / UC Diagnoses   Final diagnoses:  Peripheral neuropathy due to metabolic disorder [facility])     Discharge Instructions      We gave you a shot of Toradol Continue with Ibuprofen or Naprosyn as needed for pain Elevate feet Gentle stretching Contact premier for time of injection on 07/17/2021     ED Prescriptions   None    PDMP not reviewed this encounter.   Janith Lima, PA-C 07/08/21 1013

## 2021-07-08 NOTE — Discharge Instructions (Addendum)
We gave you a shot of Toradol Continue with Ibuprofen or Naprosyn as needed for pain Elevate feet Gentle stretching Contact premier for time of injection on 07/17/2021

## 2021-07-08 NOTE — ED Triage Notes (Signed)
PT c/o toes pain (burning) pt states it has been going on for months. Pt states pain is worse in the early morning and at bedtime. Pt is able to ambulate.

## 2021-07-12 ENCOUNTER — Other Ambulatory Visit (HOSPITAL_COMMUNITY): Payer: Self-pay

## 2021-07-12 ENCOUNTER — Other Ambulatory Visit: Payer: Self-pay

## 2021-07-12 ENCOUNTER — Other Ambulatory Visit: Payer: Self-pay | Admitting: Cardiovascular Disease

## 2021-07-14 ENCOUNTER — Other Ambulatory Visit (HOSPITAL_COMMUNITY): Payer: Self-pay

## 2021-07-14 MED ORDER — METOPROLOL TARTRATE 25 MG PO TABS
ORAL_TABLET | ORAL | 2 refills | Status: DC
  Filled 2021-07-14: qty 60, 30d supply, fill #0
  Filled 2021-08-26: qty 60, 30d supply, fill #1
  Filled 2021-11-10: qty 60, 30d supply, fill #2

## 2021-07-14 MED ORDER — EZETIMIBE 10 MG PO TABS
10.0000 mg | ORAL_TABLET | Freq: Every day | ORAL | 1 refills | Status: DC
  Filled 2021-07-21: qty 90, 90d supply, fill #0
  Filled 2021-08-26 – 2021-10-20 (×2): qty 90, 90d supply, fill #1

## 2021-07-14 NOTE — Telephone Encounter (Signed)
Refill Request.  

## 2021-07-15 ENCOUNTER — Other Ambulatory Visit (HOSPITAL_COMMUNITY): Payer: Self-pay

## 2021-07-17 ENCOUNTER — Other Ambulatory Visit (HOSPITAL_COMMUNITY): Payer: Self-pay

## 2021-07-21 ENCOUNTER — Other Ambulatory Visit (HOSPITAL_COMMUNITY): Payer: Self-pay

## 2021-07-22 ENCOUNTER — Other Ambulatory Visit (HOSPITAL_COMMUNITY): Payer: Self-pay

## 2021-08-26 ENCOUNTER — Other Ambulatory Visit (HOSPITAL_COMMUNITY): Payer: Self-pay

## 2021-09-29 ENCOUNTER — Other Ambulatory Visit (HOSPITAL_COMMUNITY): Payer: Self-pay

## 2021-10-20 ENCOUNTER — Other Ambulatory Visit (HOSPITAL_COMMUNITY): Payer: Self-pay

## 2021-10-27 ENCOUNTER — Other Ambulatory Visit (HOSPITAL_COMMUNITY): Payer: Self-pay

## 2021-11-11 ENCOUNTER — Other Ambulatory Visit (HOSPITAL_COMMUNITY): Payer: Self-pay

## 2021-11-12 ENCOUNTER — Other Ambulatory Visit: Payer: Self-pay | Admitting: Cardiovascular Disease

## 2021-11-13 ENCOUNTER — Other Ambulatory Visit (HOSPITAL_COMMUNITY): Payer: Self-pay

## 2021-11-13 ENCOUNTER — Other Ambulatory Visit: Payer: Self-pay | Admitting: Family Medicine

## 2021-11-13 MED ORDER — EZETIMIBE 10 MG PO TABS
10.0000 mg | ORAL_TABLET | Freq: Every day | ORAL | 0 refills | Status: DC
  Filled 2021-11-13 – 2022-01-02 (×3): qty 90, 90d supply, fill #0

## 2021-11-13 NOTE — Telephone Encounter (Signed)
Provider not at this practice  Requested Prescriptions  Refused Prescriptions Disp Refills   pantoprazole (PROTONIX) 40 MG tablet 30 tablet 6    Sig: TAKE 1 TABLET BY MOUTH ONCE DAILY     There is no refill protocol information for this order

## 2021-11-13 NOTE — Telephone Encounter (Signed)
This is a [facility] patient.

## 2021-11-17 ENCOUNTER — Other Ambulatory Visit (HOSPITAL_COMMUNITY): Payer: Self-pay

## 2021-11-18 ENCOUNTER — Other Ambulatory Visit (HOSPITAL_COMMUNITY): Payer: Self-pay

## 2021-11-18 MED ORDER — PANTOPRAZOLE SODIUM 40 MG PO TBEC
40.0000 mg | DELAYED_RELEASE_TABLET | Freq: Every day | ORAL | 0 refills | Status: DC
  Filled 2021-11-18: qty 30, 30d supply, fill #0

## 2021-11-18 NOTE — Addendum Note (Signed)
Addended by: Ricci Barker on: 11/18/2021 08:33 AM   Modules accepted: Orders

## 2021-11-19 ENCOUNTER — Other Ambulatory Visit (HOSPITAL_COMMUNITY): Payer: Self-pay

## 2021-12-01 NOTE — Progress Notes (Deleted)
Cardiology Office Note   Date:  12/01/2021   ID:  Steven Kindred., DOB 26-May-1956, MRN [id number]  PCP:  [name], MD  Cardiologist:  Dr. Lionel December chief complaint on file.     History of Present Illness: Steven Hale. is a 66 y.o. male who presents for a followup visit regarding peripheral arterial disease and coronary artery disease. He has known history of coronary artery disease with multiple interventions in the past. He had CABG in 2011.  He quit smoking in 2009.  He had pituitary adenoma resection in February of this year. He has known history of peripheral arterial disease with intervention on both SFAs. Cardiac catheterization in January, 2017 showed mild nonobstructive LAD disease, occluded mid left circumflex and occluded proximal right coronary artery with left-to-right collaterals. SVG to RCA was occluded and LIMA to LAD was atretic. SVG to OM was normal. Abnormal stress test was due to chronically occluded right coronary artery and graft with left-to-right collaterals. His native RCA was not favorable for CTO PCI.   The patient had recurrent severe right leg Claudication 2022.  Angiography was done in April which showed no significant aortoiliac disease, severe calcified stenosis in the right common femoral artery with patent SFA stent with moderate in-stent restenosis and two-vessel runoff below the knee.  On the left, the SFA stent was patent with minimal restenosis with two-vessel runoff below the knee. The patient underwent right common femoral artery endarterectomy by Dr. Stanford Breed.  Postoperative vascular studies showed an ABI of 0.98 on the right and 0.97 on the left.   Past Medical History:  Diagnosis Date   Anemia, unspecified    Bell's palsy    resolved - no deficits   Blood transfusion    during treatment for Ca   Blood transfusion without reported diagnosis    CAD (coronary artery disease)    a. s/p multiple PCIs;  b. s/p CABG in  09/2010 (LIMA-LAD, SVG-OM1, SVG-distal RCA/OM2);  Cardiac cath in 12/2015: Mild LAD disease, occluded mid LCX and proximal RCA (left to right collaterals), Occluded SVG to RCA and atretic LIMA. Patent SVG to OM   Chronic back pain    Diabetes mellitus without complication (HCC)    type 2   DJD (degenerative joint disease)    low back & all over    GERD (gastroesophageal reflux disease)    Helicobacter pylori (H. pylori) infection    Hemorrhoids    Hyperlipidemia    Hypertension    Impotence of organic origin    Myocardial infarction ([place])    2005 and 2012    PAD (peripheral artery disease) ([place])    a. s/p bilat SFA stents;  b. ABIs 11/2012: R 0.99, L 0.86.   Pancreatic cancer [facility])    surgery 2009   Pituitary adenoma [facility])    surgery at [facility] Feb 2020   Sleep apnea 06/05/2019   does not use cpap    Past Surgical History:  Procedure Laterality Date   ABDOMINAL AORTAGRAM N/A 09/13/2013   Procedure: ABDOMINAL Maxcine Ham;  Surgeon: Wellington Hampshire, MD;  Location: [facility] CATH LAB;  Service: Cardiovascular;  Laterality: N/A;   ABDOMINAL AORTAGRAM N/A 08/29/2014   Procedure: ABDOMINAL Maxcine Ham;  Surgeon: Wellington Hampshire, MD;  Location: [place] CATH LAB;  Service: Cardiovascular;  Laterality: N/A;   ABDOMINAL AORTOGRAM W/LOWER EXTREMITY N/A 02/19/2021   Procedure: ABDOMINAL AORTOGRAM W/LOWER EXTREMITY;  Surgeon: Wellington Hampshire, MD;  Location: [place] CV LAB;  Service: Cardiovascular;  Laterality: N/A;   BACK SURGERY     1992   CARDIAC CATHETERIZATION N/A 12/25/2015   Procedure: Left Heart Cath and Cors/Grafts Angiography;  Surgeon: Wellington Hampshire, MD;  Location: [place] CV LAB;  Service: Cardiovascular;  Laterality: N/A;   COLONOSCOPY     CORONARY ARTERY BYPASS GRAFT  nov 2011   x 4   ENDARTERECTOMY FEMORAL Right 03/03/2021   Procedure: RIGHT FEMORAL ENDARTERECTOMY;  Surgeon: Cherre Robins, MD;  Location: [place];  Service: Vascular;  Laterality: Right;   ESOPHAGOGASTRODUODENOSCOPY  (EGD) WITH PROPOFOL N/A 04/02/2017   Procedure: ESOPHAGOGASTRODUODENOSCOPY (EGD) WITH PROPOFOL;  Surgeon: Carol Ada, MD;  Location: [facility] ENDOSCOPY;  Service: Endoscopy;  Laterality: N/A;   HEMORRHOID SURGERY  10/30/2011   Procedure: HEMORRHOIDECTOMY;  Surgeon: Harl Bowie, MD;  Location: [place];  Service: General;  Laterality: N/A;   JOINT REPLACEMENT     pancreatic  cancer  05/10/2008   Resection of distal panrease and spleen   PARTIAL KNEE ARTHROPLASTY Right 11/19/2014   Procedure: RIGHT UNI-KNEE ARTHROPLASTY MEDIALLY;  Surgeon: Mauri Pole, MD;  Location: [facility] ORS;  Service: Orthopedics;  Laterality: Right;   PATCH ANGIOPLASTY Right 03/03/2021   Procedure: PATCH ANGIOPLASTY;  Surgeon: Cherre Robins, MD;  Location: [place];  Service: Vascular;  Laterality: Right;   PERIPHERAL VASCULAR CATHETERIZATION  Oct 2014   Lt SFA PTA   PERIPHERAL VASCULAR CATHETERIZATION  Oct 2015   ISR Lt SFA-PTA   right partial knee replacement     splenectomy     THROMBECTOMY FEMORAL ARTERY Left 09/13/2013   Procedure: THROMBECTOMY FEMORAL ARTERY;  Surgeon: Wellington Hampshire, MD;  Location: [place] CATH LAB;  Service: Cardiovascular;  Laterality: Left;   TRANSPHENOIDAL / TRANSNASAL HYPOPHYSECTOMY / RESECTION PITUITARY TUMOR  12/2018   [facility]     Current Outpatient Medications  Medication Sig Dispense Refill   amoxicillin (AMOXIL) 500 MG capsule TAKE 4 CAPSULES BY MOUTH 1 HOUR PRIOR TO DENTAL APPOINTMENT 20 capsule 1   aspirin EC 81 MG tablet Take 1 tablet (81 mg total) by mouth daily.     atorvastatin (LIPITOR) 40 MG tablet Take 1 tablet (40 mg total) by mouth daily. (Patient taking differently: Take 40 mg by mouth at bedtime.) 90 tablet 3   DULoxetine HCl 30 MG CSDR Take 30 mg by mouth 2 (two) times daily.     ezetimibe (ZETIA) 10 MG tablet Take 1 tablet by mouth daily. Patient must schedule annual appointment for future refills 90 tablet 0   fish oil-omega-3 fatty acids 1000 MG capsule Take 1 capsule (1 g  total) by mouth daily. 60 capsule 1   levothyroxine (SYNTHROID) 25 MCG tablet Take 25 mcg by mouth daily.     lisinopril (ZESTRIL) 2.5 MG tablet Take 2.5 mg by mouth daily.     metFORMIN (GLUCOPHAGE) 500 MG tablet Take 1 tablet (500 mg total) by mouth daily with breakfast. (Patient taking differently: Take 500 mg by mouth 2 (two) times daily with a meal.) 90 tablet 3   metoprolol tartrate (LOPRESSOR) 25 MG tablet Take 1 tablet by mouth two times daily. NEED APPT FOR REFILLS. 60 tablet 2   morphine (MSIR) 15 MG tablet Take 0.5 tablets (7.5 mg total) by mouth every 4 (four) hours as needed for severe pain. (Patient not taking: Reported on 04/10/2021) 7 tablet 0   Multiple Vitamin (MULTIVITAMIN) tablet Take 1 tablet by mouth daily. 30 tablet 10   naproxen (NAPROSYN) 500 MG tablet  Take 1 tablet (500 mg total) by mouth 2 (two) times daily. 30 tablet 0   nitroGLYCERIN (NITROSTAT) 0.4 MG SL tablet Place 0.4 mg under the tongue every 2 (two) hours as needed for chest pain.     oxyCODONE-acetaminophen (PERCOCET) 5-325 MG tablet Take 1 tablet by mouth every 6 (six) hours as needed. (Patient not taking: Reported on 04/10/2021) 20 tablet 0   pantoprazole (PROTONIX) 40 MG tablet Take 1 tablet by mouth daily. 30 tablet 0   polyethylene glycol (MIRALAX / GLYCOLAX) 17 g packet Take 17 g by mouth daily as needed for mild constipation or moderate constipation.     predniSONE (DELTASONE) 20 MG tablet Take 2 tablets by mouth daily x 4 days (Patient not taking: Reported on 04/10/2021) 8 tablet 0   pregabalin (LYRICA) 150 MG capsule Take 1 capsule (150 mg total) by mouth daily. (Patient taking differently: Take 300 mg by mouth 2 (two) times daily.) 30 capsule 0   Propylene Glycol (SYSTANE BALANCE) 0.6 % SOLN Apply 1 drop to eye 4 (four) times daily.     sildenafil (VIAGRA) 100 MG tablet Take 0.5-1 tablets (50-100 mg total) by mouth daily as needed for erectile dysfunction. 5 tablet 11   valACYclovir (VALTREX) 1000 MG tablet  Take 1 tablet (1,000 mg total) by mouth 3 (three) times daily. 21 tablet 0   No current facility-administered medications for this visit.    Allergies:   Patient has no known allergies.    Social History:  The patient  reports that he quit smoking about 2 years ago. His smoking use included cigarettes. He has a 18.00 pack-year smoking history. He has never used smokeless tobacco. He reports that he does not currently use alcohol after a past usage of about 3.0 standard drinks per week. He reports that he does not use drugs.   Family History:  The patient's family history includes Cancer in his father; Heart attack in his father.    ROS:  Please see the history of present illness.   Otherwise, review of systems are positive for none.   All other systems are reviewed and negative.    PHYSICAL EXAM: VS:  There were no vitals taken for this visit. , BMI There is no height or weight on file to calculate BMI. GEN: Well nourished, well developed, in no acute distress  HEENT: normal  Neck: no JVD, carotid bruits, or masses Cardiac: RRR; no murmurs, rubs, or gallops,no edema  Respiratory:  clear to auscultation bilaterally, normal work of breathing GI: soft, nontender, nondistended, + BS MS: no deformity or atrophy  Skin: warm and dry, no rash Neuro:  Strength and sensation are intact Psych: euthymic mood, full affect Femoral: +1 on the right and +2 on the left.  Distal pulses are palpable on the left side but not the right side.  EKG:  EKG is not ordered today.    Recent Labs: 03/09/2021: ALT 20; BUN 13; Creatinine, Ser 0.93; Hemoglobin 12.2; Platelets 398; Potassium 4.4; Sodium 135    Lipid Panel    Component Value Date/Time   CHOL 104 03/04/2021 0535   CHOL 107 08/09/2020 1549   TRIG 87 03/04/2021 0535   HDL 36 (L) 03/04/2021 0535   HDL 33 (L) 08/09/2020 1549   CHOLHDL 2.9 03/04/2021 0535   VLDL 17 03/04/2021 0535   LDLCALC 51 03/04/2021 0535   LDLCALC 49 08/09/2020 1549    LDLDIRECT 80 11/06/2013 0925      Wt Readings from Last 3  Encounters:  04/10/21 230 lb 6.4 oz (104.5 kg)  03/11/21 233 lb (105.7 kg)  03/03/21 233 lb 0.4 oz (105.7 kg)        ASSESSMENT AND PLAN:   1. Peripheral arterial disease: Previous bilateral SFA intervention in 2014 in 2015.  He is now presenting with severe right calf claudication and rest pain at night.  The symptoms started few months ago and has been getting worse.  Given severity of his symptoms, recommend proceeding with abdominal aortogram with lower extremity runoff and possible endovascular intervention.  I discussed the procedure in details as well as risks and benefits.  I will obtain an ABI and lower extremity arterial duplex before the procedure.  2. Peripheral diabetic neuropathy: Currently on Lyrica with improvement in symptoms.  3. Coronary artery disease involving native coronary arteries with stable angina: Chronic atypical chest pain.  He seems to be mostly bothered by his claudication at the present time.  4. Essential hypertension: Blood pressure is controlled on current medications.  5. Hyperlipidemia: Zetia was added last year to atorvastatin with subsequent lipid profile showed significant improvement in LDL down to 49.   Disposition:   FU with me in 1 month.  Signed,  Kathlyn Sacramento, MD  12/01/2021 5:33 PM    [place]

## 2021-12-02 ENCOUNTER — Ambulatory Visit: Payer: Non-veteran care | Admitting: Cardiovascular Disease

## 2021-12-05 ENCOUNTER — Other Ambulatory Visit (HOSPITAL_COMMUNITY): Payer: Self-pay

## 2021-12-05 ENCOUNTER — Other Ambulatory Visit: Payer: Self-pay | Admitting: Cardiovascular Disease

## 2021-12-05 MED ORDER — METOPROLOL TARTRATE 25 MG PO TABS
ORAL_TABLET | ORAL | 0 refills | Status: DC
  Filled 2021-12-05: qty 60, 30d supply, fill #0

## 2021-12-05 MED ORDER — PANTOPRAZOLE SODIUM 40 MG PO TBEC
40.0000 mg | DELAYED_RELEASE_TABLET | Freq: Every day | ORAL | 0 refills | Status: DC
  Filled 2021-12-05: qty 30, 30d supply, fill #0

## 2021-12-05 NOTE — Telephone Encounter (Signed)
Refill request

## 2021-12-12 ENCOUNTER — Other Ambulatory Visit (HOSPITAL_COMMUNITY): Payer: Self-pay

## 2021-12-13 ENCOUNTER — Other Ambulatory Visit (HOSPITAL_COMMUNITY): Payer: Self-pay

## 2021-12-24 ENCOUNTER — Encounter: Payer: Self-pay | Admitting: Pulmonary Disease

## 2021-12-24 ENCOUNTER — Other Ambulatory Visit: Payer: Self-pay

## 2021-12-24 ENCOUNTER — Ambulatory Visit (INDEPENDENT_AMBULATORY_CARE_PROVIDER_SITE_OTHER): Payer: No Typology Code available for payment source | Admitting: Pulmonary Disease

## 2021-12-24 VITALS — BP 124/82 | HR 55 | Temp 97.9°F | Ht 69.0 in | Wt 235.4 lb

## 2021-12-24 DIAGNOSIS — R911 Solitary pulmonary nodule: Secondary | ICD-10-CM

## 2021-12-24 DIAGNOSIS — Z87891 Personal history of nicotine dependence: Secondary | ICD-10-CM

## 2021-12-24 NOTE — Progress Notes (Signed)
Synopsis: Referred in February 2023 for lung nodule by Heber [place], PA-C  Subjective:   PATIENT ID: Steven Hale. GENDER: male DOB: [birth year], MRN: [id number]  Chief Complaint  Patient presents with   Follow-up    Patient is here to talk about lung nodules    This is a 66 year old gentleman, past medical history of coronary disease, type 2 diabetes, hypertension, hyperlipidemia, pancreatic cancer in 2009, sleep apnea.Patient had a lung cancer screening CT which revealed the right middle lobe well-circumscribed nodule was present on 05/03/2020, measuring 20 x 16 mm (average diameter 18 mm). On the current exam from 11/04/2021 this nodule measures 23 x 19 mm (average diameter 21 mm.  Unable to see the images today however they are present in epic care everywhere from the [facility] administration.  This lung cancer screening CT report was read as a lung RADS 4B and completed on 11/04/2021.  Here today to discuss evaluation of lung nodule consideration for biopsy.  Patient is a former smoker quit in 2009.  Smoked for 30+ years.       Past Medical History:  Diagnosis Date   Anemia, unspecified    Bell's palsy    resolved - no deficits   Blood transfusion    during treatment for Ca   Blood transfusion without reported diagnosis    CAD (coronary artery disease)    a. s/p multiple PCIs;  b. s/p CABG in 09/2010 (LIMA-LAD, SVG-OM1, SVG-distal RCA/OM2);  Cardiac cath in 12/2015: Mild LAD disease, occluded mid LCX and proximal RCA (left to right collaterals), Occluded SVG to RCA and atretic LIMA. Patent SVG to OM   Chronic back pain    Diabetes mellitus without complication (HCC)    type 2   DJD (degenerative joint disease)    low back & all over    GERD (gastroesophageal reflux disease)    Helicobacter pylori (H. pylori) infection    Hemorrhoids    Hyperlipidemia    Hypertension    Impotence of organic origin    Myocardial infarction ([place])    2005 and 2012    PAD (peripheral  artery disease) ([place])    a. s/p bilat SFA stents;  b. ABIs 11/2012: R 0.99, L 0.86.   Pancreatic cancer [facility])    surgery 2009   Pituitary adenoma [facility])    surgery at [facility] Feb 2020   Sleep apnea 06/05/2019   does not use cpap     Family History  Problem Relation Age of Onset   Heart attack Father        died of MI at age 71   Cancer Father    Anesthesia problems Neg Hx    Hypotension Neg Hx    Malignant hyperthermia Neg Hx    Pseudochol deficiency Neg Hx    Colon cancer Neg Hx    Esophageal cancer Neg Hx    Prostate cancer Neg Hx    Rectal cancer Neg Hx    Stomach cancer Neg Hx      Past Surgical History:  Procedure Laterality Date   ABDOMINAL AORTAGRAM N/A 09/13/2013   Procedure: ABDOMINAL Maxcine Ham;  Surgeon: Wellington Hampshire, MD;  Location: [place] CATH LAB;  Service: Cardiovascular;  Laterality: N/A;   ABDOMINAL AORTAGRAM N/A 08/29/2014   Procedure: ABDOMINAL Maxcine Ham;  Surgeon: Wellington Hampshire, MD;  Location: [place] CATH LAB;  Service: Cardiovascular;  Laterality: N/A;   ABDOMINAL AORTOGRAM W/LOWER EXTREMITY N/A 02/19/2021   Procedure: ABDOMINAL AORTOGRAM W/LOWER EXTREMITY;  Surgeon: Fletcher Anon,  Mertie Clause, MD;  Location: [place] CV LAB;  Service: Cardiovascular;  Laterality: N/A;   BACK SURGERY     1992   CARDIAC CATHETERIZATION N/A 12/25/2015   Procedure: Left Heart Cath and Cors/Grafts Angiography;  Surgeon: Wellington Hampshire, MD;  Location: [place] CV LAB;  Service: Cardiovascular;  Laterality: N/A;   COLONOSCOPY     CORONARY ARTERY BYPASS GRAFT  nov 2011   x 4   ENDARTERECTOMY FEMORAL Right 03/03/2021   Procedure: RIGHT FEMORAL ENDARTERECTOMY;  Surgeon: Cherre Robins, MD;  Location: [place];  Service: Vascular;  Laterality: Right;   ESOPHAGOGASTRODUODENOSCOPY (EGD) WITH PROPOFOL N/A 04/02/2017   Procedure: ESOPHAGOGASTRODUODENOSCOPY (EGD) WITH PROPOFOL;  Surgeon: Carol Ada, MD;  Location: [facility];  Service: Endoscopy;  Laterality: N/A;   HEMORRHOID SURGERY   10/30/2011   Procedure: HEMORRHOIDECTOMY;  Surgeon: Harl Bowie, MD;  Location: [place];  Service: General;  Laterality: N/A;   JOINT REPLACEMENT     pancreatic  cancer  05/10/2008   Resection of distal panrease and spleen   PARTIAL KNEE ARTHROPLASTY Right 11/19/2014   Procedure: RIGHT UNI-KNEE ARTHROPLASTY MEDIALLY;  Surgeon: Mauri Pole, MD;  Location: [facility];  Service: Orthopedics;  Laterality: Right;   PATCH ANGIOPLASTY Right 03/03/2021   Procedure: PATCH ANGIOPLASTY;  Surgeon: Cherre Robins, MD;  Location: [place];  Service: Vascular;  Laterality: Right;   PERIPHERAL VASCULAR CATHETERIZATION  Oct 2014   Lt SFA PTA   PERIPHERAL VASCULAR CATHETERIZATION  Oct 2015   ISR Lt SFA-PTA   right partial knee replacement     splenectomy     THROMBECTOMY FEMORAL ARTERY Left 09/13/2013   Procedure: THROMBECTOMY FEMORAL ARTERY;  Surgeon: Wellington Hampshire, MD;  Location: [place] CATH LAB;  Service: Cardiovascular;  Laterality: Left;   TRANSPHENOIDAL / TRANSNASAL HYPOPHYSECTOMY / RESECTION PITUITARY TUMOR  12/2018   [facility]    Social History   Socioeconomic History   Marital status: Single    Spouse name: Not on file   Number of children: 0   Years of education: Not on file   Highest education level: Not on file  Occupational History   Occupation: TEFL teacher: UNEMPLOYED  Tobacco Use   Smoking status: Former    Packs/day: 1.00    Years: 18.00    Pack years: 18.00    Types: Cigarettes    Quit date: 06/2019    Years since quitting: 2.5   Smokeless tobacco: Never  Vaping Use   Vaping Use: Never used  Substance and Sexual Activity   Alcohol use: Not Currently    Alcohol/week: 3.0 standard drinks    Types: 3 Shots of liquor per week    Comment: 2 drinks of brandy every week    Drug use: No   Sexual activity: Not Currently  Other Topics Concern   Not on file  Social History Narrative   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     atorvastatin (LIPITOR) 40 MG tablet Take 1 tablet (40 mg total) by mouth daily. (Patient taking differently: Take 40 mg by mouth at bedtime.) 90 tablet 3   DULoxetine HCl 30 MG CSDR Take 30 mg by mouth 2 (two) times daily.     ezetimibe (ZETIA) 10 MG tablet Take 1 tablet by mouth daily. Patient must schedule annual appointment for future refills 90 tablet 0   fish oil-omega-3 fatty acids 1000 MG capsule Take 1 capsule (1 g total) by mouth daily. 60 capsule 1   levothyroxine (SYNTHROID) 25 MCG tablet Take 25 mcg by mouth daily.     lisinopril (ZESTRIL) 2.5 MG tablet Take 2.5 mg by mouth daily.     metFORMIN (GLUCOPHAGE) 500 MG tablet Take 1 tablet (500 mg total) by mouth daily with breakfast. (Patient taking differently: Take 500 mg by mouth 2 (two) times daily with a meal.) 90 tablet 3   metoprolol tartrate (LOPRESSOR) 25 MG tablet Take 1 tablet by mouth two times daily. 60 tablet 0   Multiple Vitamin (MULTIVITAMIN) tablet Take 1 tablet by mouth daily. 30 tablet 10   naproxen (NAPROSYN) 500 MG tablet Take 1 tablet (500 mg total) by mouth 2 (two) times daily. 30 tablet 0   nitroGLYCERIN (NITROSTAT) 0.4 MG SL tablet Place 0.4 mg under the tongue every 2 (two) hours as needed for chest pain.     pantoprazole (PROTONIX) 40 MG tablet Take 1 tablet by mouth daily. 30 tablet 0   pregabalin (LYRICA) 150 MG capsule Take 1 capsule (150 mg total) by  mouth daily. (Patient taking differently: Take 300 mg by mouth 2 (two) times daily.) 30 capsule 0   Propylene Glycol (SYSTANE BALANCE) 0.6 % SOLN Apply 1 drop to eye 4 (four) times daily.     sildenafil (VIAGRA) 100 MG tablet Take 0.5-1 tablets (50-100 mg total) by mouth daily as needed for erectile dysfunction. 5 tablet 11   morphine (MSIR) 15 MG tablet Take 0.5 tablets (7.5 mg total) by mouth every 4 (four) hours as needed for severe pain. (Patient not taking: Reported on 04/10/2021) 7 tablet 0   oxyCODONE-acetaminophen (PERCOCET) 5-325 MG tablet Take 1 tablet by mouth every 6 (six) hours as needed. (Patient not taking: Reported on 04/10/2021) 20 tablet 0   polyethylene glycol (MIRALAX / GLYCOLAX) 17 g packet Take 17 g by mouth daily as needed for mild constipation or moderate constipation.     predniSONE (DELTASONE) 20 MG tablet Take 2 tablets by mouth daily x 4 days (Patient not taking: Reported on 04/10/2021) 8 tablet 0   valACYclovir (VALTREX) 1000 MG tablet Take 1 tablet (1,000 mg total) by mouth 3 (three) times daily. 21 tablet 0   No facility-administered medications prior to visit.    Review of Systems  Constitutional:  Negative for chills, fever, malaise/fatigue and weight loss.  HENT:  Negative for hearing loss, sore throat and tinnitus.   Eyes:  Negative for blurred vision and double vision.  Respiratory:  Negative for cough, hemoptysis, sputum production, shortness of breath, wheezing and stridor.   Cardiovascular:  Negative for chest pain, palpitations, orthopnea, leg swelling and PND.  Gastrointestinal:  Negative for abdominal pain, constipation, diarrhea, heartburn, nausea and vomiting.  Genitourinary:  Negative for dysuria, hematuria and urgency.  Musculoskeletal:  Negative for joint pain and myalgias.  Skin:  Negative for itching and rash.  Neurological:  Negative for dizziness, tingling, weakness and headaches.  Endo/Heme/Allergies:  Negative for environmental allergies. Does  not bruise/bleed easily.  Psychiatric/Behavioral:  Negative for depression. The patient is not nervous/anxious and does not have insomnia.   All other systems reviewed and are negative.   Objective:  Physical Exam Vitals reviewed.  Constitutional:      General: He is not in acute distress.    Appearance: He is well-developed.  HENT:     Head: Normocephalic and atraumatic.  Eyes:     General: No scleral icterus.    Conjunctiva/sclera: Conjunctivae normal.     Pupils: Pupils are equal, round, and reactive to light.  Neck:     Vascular: No JVD.     Trachea: No tracheal deviation.  Cardiovascular:     Rate and Rhythm: Normal rate and regular rhythm.     Heart sounds: Normal heart sounds. No murmur heard. Pulmonary:     Effort: Pulmonary effort is normal. No tachypnea, accessory muscle usage or respiratory distress.     Breath sounds: No stridor. No wheezing, rhonchi or rales.  Abdominal:     General: There is no distension.     Palpations: Abdomen is soft.     Tenderness: There is no abdominal tenderness.  Musculoskeletal:        General: No tenderness.     Cervical back: Neck supple.  Lymphadenopathy:     Cervical: No cervical adenopathy.  Skin:    General: Skin is warm and dry.     Capillary Refill: Capillary refill takes less than 2 seconds.     Findings: No rash.  Neurological:     Mental Status: He is alert and oriented to person, place, and time.  Psychiatric:        Behavior: Behavior normal.     Vitals:   12/24/21 0925  BP: 124/82  Pulse: (!) 55  Temp: 97.9 F (36.6 C)  TempSrc: Oral  SpO2: 96%  Weight: 235 lb 6.4 oz (106.8 kg)  Height: 5\' 9"  (1.753 m)   96% on RA BMI Readings from Last 3 Encounters:  12/24/21 34.76 kg/m  04/10/21 34.02 kg/m  03/11/21 34.41 kg/m   Wt Readings from Last 3 Encounters:  12/24/21 235 lb 6.4 oz (106.8 kg)  04/10/21 230 lb 6.4 oz (104.5 kg)  03/11/21 233 lb (105.7 kg)     CBC    Component Value Date/Time   WBC  6.3 03/09/2021 2223   RBC 3.73 (L) 03/09/2021 2223   HGB 12.2 (L) 03/09/2021 2223   HGB 12.9 (L) 02/04/2021 1150   HGB 12.4 (L) 09/08/2010 1059   HCT 34.2 (L) 03/09/2021 2223   HCT 38.8 02/04/2021 1150   HCT 36.7 (L) 09/08/2010 1059   PLT 398 03/09/2021 2223   PLT 284 02/04/2021 1150   MCV 91.7 03/09/2021 2223   MCV 87 02/04/2021 1150   MCV 88.6 09/08/2010 1059   MCH 32.7 03/09/2021 2223   MCHC 35.7 03/09/2021 2223   RDW 14.7 03/09/2021 2223   RDW 13.7 02/04/2021 1150   RDW 17.1 (H) 09/08/2010 1059   LYMPHSABS 2.8 03/09/2021 2223   LYMPHSABS 2.9 03/21/2019 1009   LYMPHSABS 2.7 09/08/2010 1059   MONOABS 0.8 03/09/2021 2223   MONOABS 0.6 09/08/2010 1059   EOSABS 0.3 03/09/2021 2223   EOSABS 0.2 03/21/2019 1009   BASOSABS 0.1 03/09/2021 2223   BASOSABS 0.0 03/21/2019 1009   BASOSABS 0.0 09/08/2010 1059  Chest Imaging: 11/04/2021 lung cancer screening CT: 23 mm right middle lobe pulmonary nodule. The patient's images have been independently reviewed by me.    Pulmonary Functions Testing Results: No flowsheet data found.  FeNO:   Pathology:   Echocardiogram:   Heart Catheterization:     Assessment & Plan:     ICD-10-CM   1. Right middle lobe pulmonary nodule  R91.1 CT Super D Chest Wo Contrast    Procedural/ Surgical Case Request: ROBOTIC ASSISTED NAVIGATIONAL BRONCHOSCOPY    Ambulatory referral to Pulmonology    NM PET Image Initial (PI) Skull Base To Thigh    NM PET Image Initial (PI) Skull Base To Thigh (F-18 FDG)    CANCELED: NM PET SUPER D CT    CANCELED: NM PET SUPER D CT    2. Former smoker  Z87.891       Discussion:  This is a 66 year old gentleman, longstanding history of tobacco abuse, abnormal lung cancer screening CT, quit smoking in 2009 now has a slowly enlarging pulmonary nodule within the right middle lobe.  He had showed low-level PET avidity in 2021.  Here today for evaluation and consideration for biopsy as the nodule has slowly enlarged.   Referred from the [facility].  Plan: Today we discussed the risk benefits alternatives of proceeding with robotic assisted navigational bronchoscopy and tissue sampling. Patient is agreeable to proceed. He will need a super D CT. We will also order a PET scan.  This has also been ordered he said to be done at the [place] in [place] which is going to be sometime in March but he would like to go ahead and get this done here if at all possible.  We will move ahead and have his order scheduled within the next few weeks and get him scheduled for bronchoscopy on 01/13/2022.  We appreciate the PCC's help with scheduling.   Current Outpatient Medications:    amoxicillin (AMOXIL) 500 MG capsule, TAKE 4 CAPSULES BY MOUTH 1 HOUR PRIOR TO DENTAL APPOINTMENT, Disp: 20 capsule, Rfl: 1   aspirin EC 81 MG tablet, Take 1 tablet (81 mg total) by mouth daily., Disp: , Rfl:    atorvastatin (LIPITOR) 40 MG tablet, Take 1 tablet (40 mg total) by mouth daily. (Patient taking differently: Take 40 mg by mouth at bedtime.), Disp: 90 tablet, Rfl: 3   DULoxetine HCl 30 MG CSDR, Take 30 mg by mouth 2 (two) times daily., Disp: , Rfl:    ezetimibe (ZETIA) 10 MG tablet, Take 1 tablet by mouth daily. Patient must schedule annual appointment for future refills, Disp: 90 tablet, Rfl: 0   fish oil-omega-3 fatty acids 1000 MG capsule, Take 1 capsule (1 g total) by mouth daily., Disp: 60 capsule, Rfl: 1   levothyroxine (SYNTHROID) 25 MCG tablet, Take 25 mcg by mouth daily., Disp: , Rfl:    lisinopril (ZESTRIL) 2.5 MG tablet, Take 2.5 mg by mouth daily., Disp: , Rfl:    metFORMIN (GLUCOPHAGE) 500 MG tablet, Take 1 tablet (500 mg total) by mouth daily with breakfast. (Patient taking differently: Take 500 mg by mouth 2 (two) times daily with a meal.), Disp: 90 tablet, Rfl: 3   metoprolol tartrate (LOPRESSOR) 25 MG tablet, Take 1 tablet by mouth two times daily., Disp: 60 tablet, Rfl: 0   Multiple Vitamin (MULTIVITAMIN) tablet, Take  1 tablet by mouth daily., Disp: 30 tablet, Rfl: 10   naproxen (NAPROSYN) 500 MG tablet, Take 1 tablet (500 mg total) by mouth 2 (  two) times daily., Disp: 30 tablet, Rfl: 0   nitroGLYCERIN (NITROSTAT) 0.4 MG SL tablet, Place 0.4 mg under the tongue every 2 (two) hours as needed for chest pain., Disp: , Rfl:    pantoprazole (PROTONIX) 40 MG tablet, Take 1 tablet by mouth daily., Disp: 30 tablet, Rfl: 0   pregabalin (LYRICA) 150 MG capsule, Take 1 capsule (150 mg total) by mouth daily. (Patient taking differently: Take 300 mg by mouth 2 (two) times daily.), Disp: 30 capsule, Rfl: 0   Propylene Glycol (SYSTANE BALANCE) 0.6 % SOLN, Apply 1 drop to eye 4 (four) times daily., Disp: , Rfl:    sildenafil (VIAGRA) 100 MG tablet, Take 0.5-1 tablets (50-100 mg total) by mouth daily as needed for erectile dysfunction., Disp: 5 tablet, Rfl: 11   morphine (MSIR) 15 MG tablet, Take 0.5 tablets (7.5 mg total) by mouth every 4 (four) hours as needed for severe pain. (Patient not taking: Reported on 04/10/2021), Disp: 7 tablet, Rfl: 0   oxyCODONE-acetaminophen (PERCOCET) 5-325 MG tablet, Take 1 tablet by mouth every 6 (six) hours as needed. (Patient not taking: Reported on 04/10/2021), Disp: 20 tablet, Rfl: 0   polyethylene glycol (MIRALAX / GLYCOLAX) 17 g packet, Take 17 g by mouth daily as needed for mild constipation or moderate constipation., Disp: , Rfl:    predniSONE (DELTASONE) 20 MG tablet, Take 2 tablets by mouth daily x 4 days (Patient not taking: Reported on 04/10/2021), Disp: 8 tablet, Rfl: 0   valACYclovir (VALTREX) 1000 MG tablet, Take 1 tablet (1,000 mg total) by mouth 3 (three) times daily., Disp: 21 tablet, Rfl: 0  I spent 62 minutes dedicated to the care of this patient on the date of this encounter to include pre-visit review of records, face-to-face time with the patient discussing conditions above, post visit ordering of testing, clinical documentation with the electronic health record, making  appropriate referrals as documented, and communicating necessary findings to members of the patients care team.   Garner Nash, DO [facility] 12/24/2021 10:32 AM

## 2021-12-24 NOTE — H&P (View-Only) (Signed)
Synopsis: Referred in February 2023 for lung nodule by Heber Jacksonport, PA-C  Subjective:   PATIENT ID: Steven Hale. GENDER: male DOB: 19-Sep-1956, MRN: [id number]  Chief Complaint  Patient presents with   Follow-up    Patient is here to talk about lung nodules    This is a 66 year old gentleman, past medical history of coronary disease, type 2 diabetes, hypertension, hyperlipidemia, pancreatic cancer in 2009, sleep apnea.Patient had a lung cancer screening CT which revealed the right middle lobe well-circumscribed nodule was present on 05/03/2020, measuring 20 x 16 mm (average diameter 18 mm). On the current exam from 11/04/2021 this nodule measures 23 x 19 mm (average diameter 21 mm.  Unable to see the images today however they are present in epic care everywhere from the [facility] administration.  This lung cancer screening CT report was read as a lung RADS 4B and completed on 11/04/2021.  Here today to discuss evaluation of lung nodule consideration for biopsy.  Patient is a former smoker quit in 2009.  Smoked for 30+ years.       Past Medical History:  Diagnosis Date   Anemia, unspecified    Bell's palsy    resolved - no deficits   Blood transfusion    during treatment for Ca   Blood transfusion without reported diagnosis    CAD (coronary artery disease)    a. s/p multiple PCIs;  b. s/p CABG in 09/2010 (LIMA-LAD, SVG-OM1, SVG-distal RCA/OM2);  Cardiac cath in 12/2015: Mild LAD disease, occluded mid LCX and proximal RCA (left to right collaterals), Occluded SVG to RCA and atretic LIMA. Patent SVG to OM   Chronic back pain    Diabetes mellitus without complication (HCC)    type 2   DJD (degenerative joint disease)    low back & all over    GERD (gastroesophageal reflux disease)    Helicobacter pylori (H. pylori) infection    Hemorrhoids    Hyperlipidemia    Hypertension    Impotence of organic origin    Myocardial infarction ([place])    2005 and 2012    PAD (peripheral  artery disease) ([place])    a. s/p bilat SFA stents;  b. ABIs 11/2012: R 0.99, L 0.86.   Pancreatic cancer [facility])    surgery 2009   Pituitary adenoma [facility])    surgery at [facility] Feb 2020   Sleep apnea 06/05/2019   does not use cpap     Family History  Problem Relation Age of Onset   Heart attack Father        died of MI at age 68   Cancer Father    Anesthesia problems Neg Hx    Hypotension Neg Hx    Malignant hyperthermia Neg Hx    Pseudochol deficiency Neg Hx    Colon cancer Neg Hx    Esophageal cancer Neg Hx    Prostate cancer Neg Hx    Rectal cancer Neg Hx    Stomach cancer Neg Hx      Past Surgical History:  Procedure Laterality Date   ABDOMINAL AORTAGRAM N/A 09/13/2013   Procedure: ABDOMINAL Maxcine Ham;  Surgeon: Wellington Hampshire, MD;  Location: [place] CATH LAB;  Service: Cardiovascular;  Laterality: N/A;   ABDOMINAL AORTAGRAM N/A 08/29/2014   Procedure: ABDOMINAL Maxcine Ham;  Surgeon: Wellington Hampshire, MD;  Location: [place] CATH LAB;  Service: Cardiovascular;  Laterality: N/A;   ABDOMINAL AORTOGRAM W/LOWER EXTREMITY N/A 02/19/2021   Procedure: ABDOMINAL AORTOGRAM W/LOWER EXTREMITY;  Surgeon: Fletcher Anon,  Mertie Clause, MD;  Location: [place] CV LAB;  Service: Cardiovascular;  Laterality: N/A;   BACK SURGERY     1992   CARDIAC CATHETERIZATION N/A 12/25/2015   Procedure: Left Heart Cath and Cors/Grafts Angiography;  Surgeon: Wellington Hampshire, MD;  Location: [place] CV LAB;  Service: Cardiovascular;  Laterality: N/A;   COLONOSCOPY     CORONARY ARTERY BYPASS GRAFT  nov 2011   x 4   ENDARTERECTOMY FEMORAL Right 03/03/2021   Procedure: RIGHT FEMORAL ENDARTERECTOMY;  Surgeon: Cherre Robins, MD;  Location: [place];  Service: Vascular;  Laterality: Right;   ESOPHAGOGASTRODUODENOSCOPY (EGD) WITH PROPOFOL N/A 04/02/2017   Procedure: ESOPHAGOGASTRODUODENOSCOPY (EGD) WITH PROPOFOL;  Surgeon: Carol Ada, MD;  Location: [facility];  Service: Endoscopy;  Laterality: N/A;   HEMORRHOID SURGERY   10/30/2011   Procedure: HEMORRHOIDECTOMY;  Surgeon: Harl Bowie, MD;  Location: [place];  Service: General;  Laterality: N/A;   JOINT REPLACEMENT     pancreatic  cancer  05/10/2008   Resection of distal panrease and spleen   PARTIAL KNEE ARTHROPLASTY Right 11/19/2014   Procedure: RIGHT UNI-KNEE ARTHROPLASTY MEDIALLY;  Surgeon: Mauri Pole, MD;  Location: [facility];  Service: Orthopedics;  Laterality: Right;   PATCH ANGIOPLASTY Right 03/03/2021   Procedure: PATCH ANGIOPLASTY;  Surgeon: Cherre Robins, MD;  Location: [place];  Service: Vascular;  Laterality: Right;   PERIPHERAL VASCULAR CATHETERIZATION  Oct 2014   Lt SFA PTA   PERIPHERAL VASCULAR CATHETERIZATION  Oct 2015   ISR Lt SFA-PTA   right partial knee replacement     splenectomy     THROMBECTOMY FEMORAL ARTERY Left 09/13/2013   Procedure: THROMBECTOMY FEMORAL ARTERY;  Surgeon: Wellington Hampshire, MD;  Location: [place] CATH LAB;  Service: Cardiovascular;  Laterality: Left;   TRANSPHENOIDAL / TRANSNASAL HYPOPHYSECTOMY / RESECTION PITUITARY TUMOR  12/2018   [facility]    Social History   Socioeconomic History   Marital status: Single    Spouse name: Not on file   Number of children: 0   Years of education: Not on file   Highest education level: Not on file  Occupational History   Occupation: TEFL teacher: UNEMPLOYED  Tobacco Use   Smoking status: Former    Packs/day: 1.00    Years: 18.00    Pack years: 18.00    Types: Cigarettes    Quit date: 06/2019    Years since quitting: 2.5   Smokeless tobacco: Never  Vaping Use   Vaping Use: Never used  Substance and Sexual Activity   Alcohol use: Not Currently    Alcohol/week: 3.0 standard drinks    Types: 3 Shots of liquor per week    Comment: 2 drinks of brandy every week    Drug use: No   Sexual activity: Not Currently  Other Topics Concern   Not on file  Social History Narrative   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     atorvastatin (LIPITOR) 40 MG tablet Take 1 tablet (40 mg total) by mouth daily. (Patient taking differently: Take 40 mg by mouth at bedtime.) 90 tablet 3   DULoxetine HCl 30 MG CSDR Take 30 mg by mouth 2 (two) times daily.     ezetimibe (ZETIA) 10 MG tablet Take 1 tablet by mouth daily. Patient must schedule annual appointment for future refills 90 tablet 0   fish oil-omega-3 fatty acids 1000 MG capsule Take 1 capsule (1 g total) by mouth daily. 60 capsule 1   levothyroxine (SYNTHROID) 25 MCG tablet Take 25 mcg by mouth daily.     lisinopril (ZESTRIL) 2.5 MG tablet Take 2.5 mg by mouth daily.     metFORMIN (GLUCOPHAGE) 500 MG tablet Take 1 tablet (500 mg total) by mouth daily with breakfast. (Patient taking differently: Take 500 mg by mouth 2 (two) times daily with a meal.) 90 tablet 3   metoprolol tartrate (LOPRESSOR) 25 MG tablet Take 1 tablet by mouth two times daily. 60 tablet 0   Multiple Vitamin (MULTIVITAMIN) tablet Take 1 tablet by mouth daily. 30 tablet 10   naproxen (NAPROSYN) 500 MG tablet Take 1 tablet (500 mg total) by mouth 2 (two) times daily. 30 tablet 0   nitroGLYCERIN (NITROSTAT) 0.4 MG SL tablet Place 0.4 mg under the tongue every 2 (two) hours as needed for chest pain.     pantoprazole (PROTONIX) 40 MG tablet Take 1 tablet by mouth daily. 30 tablet 0   pregabalin (LYRICA) 150 MG capsule Take 1 capsule (150 mg total) by  mouth daily. (Patient taking differently: Take 300 mg by mouth 2 (two) times daily.) 30 capsule 0   Propylene Glycol (SYSTANE BALANCE) 0.6 % SOLN Apply 1 drop to eye 4 (four) times daily.     sildenafil (VIAGRA) 100 MG tablet Take 0.5-1 tablets (50-100 mg total) by mouth daily as needed for erectile dysfunction. 5 tablet 11   morphine (MSIR) 15 MG tablet Take 0.5 tablets (7.5 mg total) by mouth every 4 (four) hours as needed for severe pain. (Patient not taking: Reported on 04/10/2021) 7 tablet 0   oxyCODONE-acetaminophen (PERCOCET) 5-325 MG tablet Take 1 tablet by mouth every 6 (six) hours as needed. (Patient not taking: Reported on 04/10/2021) 20 tablet 0   polyethylene glycol (MIRALAX / GLYCOLAX) 17 g packet Take 17 g by mouth daily as needed for mild constipation or moderate constipation.     predniSONE (DELTASONE) 20 MG tablet Take 2 tablets by mouth daily x 4 days (Patient not taking: Reported on 04/10/2021) 8 tablet 0   valACYclovir (VALTREX) 1000 MG tablet Take 1 tablet (1,000 mg total) by mouth 3 (three) times daily. 21 tablet 0   No facility-administered medications prior to visit.    Review of Systems  Constitutional:  Negative for chills, fever, malaise/fatigue and weight loss.  HENT:  Negative for hearing loss, sore throat and tinnitus.   Eyes:  Negative for blurred vision and double vision.  Respiratory:  Negative for cough, hemoptysis, sputum production, shortness of breath, wheezing and stridor.   Cardiovascular:  Negative for chest pain, palpitations, orthopnea, leg swelling and PND.  Gastrointestinal:  Negative for abdominal pain, constipation, diarrhea, heartburn, nausea and vomiting.  Genitourinary:  Negative for dysuria, hematuria and urgency.  Musculoskeletal:  Negative for joint pain and myalgias.  Skin:  Negative for itching and rash.  Neurological:  Negative for dizziness, tingling, weakness and headaches.  Endo/Heme/Allergies:  Negative for environmental allergies. Does  not bruise/bleed easily.  Psychiatric/Behavioral:  Negative for depression. The patient is not nervous/anxious and does not have insomnia.   All other systems reviewed and are negative.   Objective:  Physical Exam Vitals reviewed.  Constitutional:      General: He is not in acute distress.    Appearance: He is well-developed.  HENT:     Head: Normocephalic and atraumatic.  Eyes:     General: No scleral icterus.    Conjunctiva/sclera: Conjunctivae normal.     Pupils: Pupils are equal, round, and reactive to light.  Neck:     Vascular: No JVD.     Trachea: No tracheal deviation.  Cardiovascular:     Rate and Rhythm: Normal rate and regular rhythm.     Heart sounds: Normal heart sounds. No murmur heard. Pulmonary:     Effort: Pulmonary effort is normal. No tachypnea, accessory muscle usage or respiratory distress.     Breath sounds: No stridor. No wheezing, rhonchi or rales.  Abdominal:     General: There is no distension.     Palpations: Abdomen is soft.     Tenderness: There is no abdominal tenderness.  Musculoskeletal:        General: No tenderness.     Cervical back: Neck supple.  Lymphadenopathy:     Cervical: No cervical adenopathy.  Skin:    General: Skin is warm and dry.     Capillary Refill: Capillary refill takes less than 2 seconds.     Findings: No rash.  Neurological:     Mental Status: He is alert and oriented to person, place, and time.  Psychiatric:        Behavior: Behavior normal.     Vitals:   12/24/21 0925  BP: 124/82  Pulse: (!) 55  Temp: 97.9 F (36.6 C)  TempSrc: Oral  SpO2: 96%  Weight: 235 lb 6.4 oz (106.8 kg)  Height: 5\' 9"  (1.753 m)   96% on RA BMI Readings from Last 3 Encounters:  12/24/21 34.76 kg/m  04/10/21 34.02 kg/m  03/11/21 34.41 kg/m   Wt Readings from Last 3 Encounters:  12/24/21 235 lb 6.4 oz (106.8 kg)  04/10/21 230 lb 6.4 oz (104.5 kg)  03/11/21 233 lb (105.7 kg)     CBC    Component Value Date/Time   WBC  6.3 03/09/2021 2223   RBC 3.73 (L) 03/09/2021 2223   HGB 12.2 (L) 03/09/2021 2223   HGB 12.9 (L) 02/04/2021 1150   HGB 12.4 (L) 09/08/2010 1059   HCT 34.2 (L) 03/09/2021 2223   HCT 38.8 02/04/2021 1150   HCT 36.7 (L) 09/08/2010 1059   PLT 398 03/09/2021 2223   PLT 284 02/04/2021 1150   MCV 91.7 03/09/2021 2223   MCV 87 02/04/2021 1150   MCV 88.6 09/08/2010 1059   MCH 32.7 03/09/2021 2223   MCHC 35.7 03/09/2021 2223   RDW 14.7 03/09/2021 2223   RDW 13.7 02/04/2021 1150   RDW 17.1 (H) 09/08/2010 1059   LYMPHSABS 2.8 03/09/2021 2223   LYMPHSABS 2.9 03/21/2019 1009   LYMPHSABS 2.7 09/08/2010 1059   MONOABS 0.8 03/09/2021 2223   MONOABS 0.6 09/08/2010 1059   EOSABS 0.3 03/09/2021 2223   EOSABS 0.2 03/21/2019 1009   BASOSABS 0.1 03/09/2021 2223   BASOSABS 0.0 03/21/2019 1009   BASOSABS 0.0 09/08/2010 1059  Chest Imaging: 11/04/2021 lung cancer screening CT: 23 mm right middle lobe pulmonary nodule. The patient's images have been independently reviewed by me.    Pulmonary Functions Testing Results: No flowsheet data found.  FeNO:   Pathology:   Echocardiogram:   Heart Catheterization:     Assessment & Plan:     ICD-10-CM   1. Right middle lobe pulmonary nodule  R91.1 CT Super D Chest Wo Contrast    Procedural/ Surgical Case Request: ROBOTIC ASSISTED NAVIGATIONAL BRONCHOSCOPY    Ambulatory referral to Pulmonology    NM PET Image Initial (PI) Skull Base To Thigh    NM PET Image Initial (PI) Skull Base To Thigh (F-18 FDG)    CANCELED: NM PET SUPER D CT    CANCELED: NM PET SUPER D CT    2. Former smoker  Z87.891       Discussion:  This is a 66 year old gentleman, longstanding history of tobacco abuse, abnormal lung cancer screening CT, quit smoking in 2009 now has a slowly enlarging pulmonary nodule within the right middle lobe.  He had showed low-level PET avidity in 2021.  Here today for evaluation and consideration for biopsy as the nodule has slowly enlarged.   Referred from the [facility]  Plan: Today we discussed the risk benefits alternatives of proceeding with robotic assisted navigational bronchoscopy and tissue sampling. Patient is agreeable to proceed. He will need a super D CT. We will also order a PET scan.  This has also been ordered he said to be done at the [place] in [place] which is going to be sometime in March but he would like to go ahead and get this done here if at all possible.  We will move ahead and have his order scheduled within the next few weeks and get him scheduled for bronchoscopy on 01/13/2022.  We appreciate the PCC's help with scheduling.   Current Outpatient Medications:    amoxicillin (AMOXIL) 500 MG capsule, TAKE 4 CAPSULES BY MOUTH 1 HOUR PRIOR TO DENTAL APPOINTMENT, Disp: 20 capsule, Rfl: 1   aspirin EC 81 MG tablet, Take 1 tablet (81 mg total) by mouth daily., Disp: , Rfl:    atorvastatin (LIPITOR) 40 MG tablet, Take 1 tablet (40 mg total) by mouth daily. (Patient taking differently: Take 40 mg by mouth at bedtime.), Disp: 90 tablet, Rfl: 3   DULoxetine HCl 30 MG CSDR, Take 30 mg by mouth 2 (two) times daily., Disp: , Rfl:    ezetimibe (ZETIA) 10 MG tablet, Take 1 tablet by mouth daily. Patient must schedule annual appointment for future refills, Disp: 90 tablet, Rfl: 0   fish oil-omega-3 fatty acids 1000 MG capsule, Take 1 capsule (1 g total) by mouth daily., Disp: 60 capsule, Rfl: 1   levothyroxine (SYNTHROID) 25 MCG tablet, Take 25 mcg by mouth daily., Disp: , Rfl:    lisinopril (ZESTRIL) 2.5 MG tablet, Take 2.5 mg by mouth daily., Disp: , Rfl:    metFORMIN (GLUCOPHAGE) 500 MG tablet, Take 1 tablet (500 mg total) by mouth daily with breakfast. (Patient taking differently: Take 500 mg by mouth 2 (two) times daily with a meal.), Disp: 90 tablet, Rfl: 3   metoprolol tartrate (LOPRESSOR) 25 MG tablet, Take 1 tablet by mouth two times daily., Disp: 60 tablet, Rfl: 0   Multiple Vitamin (MULTIVITAMIN) tablet, Take  1 tablet by mouth daily., Disp: 30 tablet, Rfl: 10   naproxen (NAPROSYN) 500 MG tablet, Take 1 tablet (500 mg total) by mouth 2 (  two) times daily., Disp: 30 tablet, Rfl: 0   nitroGLYCERIN (NITROSTAT) 0.4 MG SL tablet, Place 0.4 mg under the tongue every 2 (two) hours as needed for chest pain., Disp: , Rfl:    pantoprazole (PROTONIX) 40 MG tablet, Take 1 tablet by mouth daily., Disp: 30 tablet, Rfl: 0   pregabalin (LYRICA) 150 MG capsule, Take 1 capsule (150 mg total) by mouth daily. (Patient taking differently: Take 300 mg by mouth 2 (two) times daily.), Disp: 30 capsule, Rfl: 0   Propylene Glycol (SYSTANE BALANCE) 0.6 % SOLN, Apply 1 drop to eye 4 (four) times daily., Disp: , Rfl:    sildenafil (VIAGRA) 100 MG tablet, Take 0.5-1 tablets (50-100 mg total) by mouth daily as needed for erectile dysfunction., Disp: 5 tablet, Rfl: 11   morphine (MSIR) 15 MG tablet, Take 0.5 tablets (7.5 mg total) by mouth every 4 (four) hours as needed for severe pain. (Patient not taking: Reported on 04/10/2021), Disp: 7 tablet, Rfl: 0   oxyCODONE-acetaminophen (PERCOCET) 5-325 MG tablet, Take 1 tablet by mouth every 6 (six) hours as needed. (Patient not taking: Reported on 04/10/2021), Disp: 20 tablet, Rfl: 0   polyethylene glycol (MIRALAX / GLYCOLAX) 17 g packet, Take 17 g by mouth daily as needed for mild constipation or moderate constipation., Disp: , Rfl:    predniSONE (DELTASONE) 20 MG tablet, Take 2 tablets by mouth daily x 4 days (Patient not taking: Reported on 04/10/2021), Disp: 8 tablet, Rfl: 0   valACYclovir (VALTREX) 1000 MG tablet, Take 1 tablet (1,000 mg total) by mouth 3 (three) times daily., Disp: 21 tablet, Rfl: 0  I spent 62 minutes dedicated to the care of this patient on the date of this encounter to include pre-visit review of records, face-to-face time with the patient discussing conditions above, post visit ordering of testing, clinical documentation with the electronic health record, making  appropriate referrals as documented, and communicating necessary findings to members of the patients care team.   Garner Nash, DO [facility] 12/24/2021 10:32 AM

## 2021-12-24 NOTE — Patient Instructions (Signed)
Thank you for visiting Dr. Valeta Harms at [facility]. Today we recommend the following:  Orders Placed This Encounter  Procedures   Procedural/ Surgical Case Request: ROBOTIC ASSISTED NAVIGATIONAL BRONCHOSCOPY   CT Super D Chest Wo Contrast   NM PET SUPER D CT   Ambulatory referral to Pulmonology   Bronchscopy on 01/13/2022  Return in about 27 days (around 01/20/2022) for with Eric Form, NP .    Please do your part to reduce the spread of COVID-19.

## 2021-12-31 ENCOUNTER — Other Ambulatory Visit: Payer: Self-pay

## 2021-12-31 ENCOUNTER — Ambulatory Visit (HOSPITAL_COMMUNITY)
Admission: RE | Admit: 2021-12-31 | Discharge: 2021-12-31 | Disposition: A | Discharge status: Home / Self Care | Payer: No Typology Code available for payment source | Source: Ambulatory Visit | Attending: Pulmonary Disease | Admitting: Pulmonary Disease

## 2021-12-31 DIAGNOSIS — R911 Solitary pulmonary nodule: Secondary | ICD-10-CM | POA: Diagnosis present

## 2022-01-01 ENCOUNTER — Other Ambulatory Visit (HOSPITAL_COMMUNITY): Payer: Self-pay

## 2022-01-02 ENCOUNTER — Other Ambulatory Visit: Payer: Self-pay | Admitting: Cardiovascular Disease

## 2022-01-02 ENCOUNTER — Other Ambulatory Visit (HOSPITAL_COMMUNITY): Payer: Self-pay

## 2022-01-02 ENCOUNTER — Other Ambulatory Visit: Payer: Self-pay

## 2022-01-02 MED ORDER — PANTOPRAZOLE SODIUM 40 MG PO TBEC
40.0000 mg | DELAYED_RELEASE_TABLET | Freq: Every day | ORAL | 0 refills | Status: DC
  Filled 2022-01-02 – 2022-01-05 (×2): qty 30, 30d supply, fill #0

## 2022-01-02 MED ORDER — METOPROLOL TARTRATE 25 MG PO TABS
ORAL_TABLET | ORAL | 0 refills | Status: DC
  Filled 2022-01-02: qty 60, 30d supply, fill #0

## 2022-01-02 NOTE — Telephone Encounter (Signed)
REFILL 

## 2022-01-02 NOTE — Telephone Encounter (Signed)
Refill Request.  

## 2022-01-05 ENCOUNTER — Other Ambulatory Visit (HOSPITAL_COMMUNITY): Payer: Self-pay

## 2022-01-05 ENCOUNTER — Other Ambulatory Visit: Payer: Self-pay

## 2022-01-05 ENCOUNTER — Ambulatory Visit (HOSPITAL_COMMUNITY)
Admission: RE | Admit: 2022-01-05 | Discharge: 2022-01-05 | Disposition: A | Discharge status: Home / Self Care | Payer: No Typology Code available for payment source | Source: Ambulatory Visit | Attending: Pulmonary Disease | Admitting: Pulmonary Disease

## 2022-01-05 DIAGNOSIS — R911 Solitary pulmonary nodule: Secondary | ICD-10-CM | POA: Diagnosis present

## 2022-01-05 LAB — GLUCOSE, CAPILLARY: Glucose-Capillary: 139 mg/dL — ABNORMAL HIGH (ref 70–99)

## 2022-01-05 MED ORDER — FLUDEOXYGLUCOSE F - 18 (FDG) INJECTION
11.6300 | Freq: Once | INTRAVENOUS | Status: AC | PRN
  Administered 2022-01-05: 11.63 via INTRAVENOUS

## 2022-01-06 ENCOUNTER — Telehealth: Payer: Self-pay | Admitting: Pulmonary Disease

## 2022-01-06 NOTE — Telephone Encounter (Signed)
Called and spoke with patient. He is scheduled for an EBUS on 01/13/22. He was calling to see where the procedure would be performed about how long the procedure will last. I advised him that the procedure will be done at [facility] and he can enter the hospital through the main entrance and ask someone at the help desk how to get to endoscopy.   He also wanted to know how long the procedure would last and if he would be admitted. Advised him that the length of procedure really depends on the patient. Give him an estimated time of 1.5-2hrs. Also explained that patients are not usually admitted unless there were complications during the procedure. He is aware that he will need to have someone to drive him afterwards.   He is aware that he can call our office back if he has anymore questions. Verbalized understanding.   Nothing further needed at time of call.

## 2022-01-08 ENCOUNTER — Other Ambulatory Visit (HOSPITAL_COMMUNITY): Payer: Self-pay

## 2022-01-09 ENCOUNTER — Encounter (HOSPITAL_COMMUNITY): Payer: Self-pay | Admitting: Pulmonary Disease

## 2022-01-09 ENCOUNTER — Other Ambulatory Visit: Payer: Self-pay

## 2022-01-09 NOTE — Progress Notes (Addendum)
Spoke with pt for pre-op call. Pt has hx of CAD, sees Dr. Stanford Breed and also goes to [place]. Pt denies any recent chest pain or shortness of breath. Pt is a type 2 Diabetic, only on Meformin. He states he doesn't check his blood sugar unless he's feeling bad. Instructed pt to hold his Metformin the morning of surgery. Pt's most recent A1C was 7.3 on 12/31/21.  Covid test day of procedure.

## 2022-01-12 NOTE — Anesthesia Preprocedure Evaluation (Addendum)
Anesthesia Evaluation  Patient identified by MRN, date of birth, ID band Patient awake    Reviewed: Allergy & Precautions, NPO status , Patient's Chart, lab work & pertinent test results, reviewed documented beta blocker date and time   Airway Mallampati: III  TM Distance: >3 FB Neck ROM: Full    Dental  (+) Edentulous Upper, Partial Lower, Dental Advisory Given   Pulmonary sleep apnea , former smoker,  RML pulmonary nodule   breath sounds clear to auscultation + decreased breath sounds      Cardiovascular hypertension, Pt. on medications + CAD, + Past MI, + Cardiac Stents, + CABG and + Peripheral Vascular Disease  Normal cardiovascular exam Rhythm:Regular Rate:Normal  S/P 4v CABG 2014 S/P DES OM1 S/P right CEA S/P Right FA endarterectomy   Neuro/Psych Peripheral neuropathy  Neuromuscular disease negative psych ROS   GI/Hepatic Neg liver ROS, GERD  Medicated and Controlled,Hx/o Pancreatic Ca S/P distal pancreatectomy   Endo/Other  diabetes, Poorly Controlled, Type 2, Oral Hypoglycemic AgentsHypothyroidism Hx/o Pituitary adenoma S/P transphenoidal resection  Renal/GU negative Renal ROS  negative genitourinary   Musculoskeletal  (+) Arthritis , Osteoarthritis,    Abdominal (+) + obese,   Peds  Hematology  (+) Blood dyscrasia, anemia ,   Anesthesia Other Findings   Reproductive/Obstetrics                           Anesthesia Physical Anesthesia Plan  ASA: 3  Anesthesia Plan: General   Post-op Pain Management: Minimal or no pain anticipated   Induction: Intravenous  PONV Risk Score and Plan: 2 and Treatment may vary due to age or medical condition and Ondansetron  Airway Management Planned: Oral ETT  Additional Equipment: None  Intra-op Plan:   Post-operative Plan: Extubation in OR  Informed Consent: I have reviewed the patients History and Physical, chart, labs and discussed the  procedure including the risks, benefits and alternatives for the proposed anesthesia with the patient or authorized representative who has indicated his/her understanding and acceptance.     Dental advisory given  Plan Discussed with: CRNA and Anesthesiologist  Anesthesia Plan Comments: (PAT note written 01/12/2022 by Myra Gianotti, PA-C. )      Anesthesia Quick Evaluation

## 2022-01-12 NOTE — Progress Notes (Signed)
Anesthesia Chart Review: Steven Hale  Case: [id number] Date/Time: 01/13/22 0915   Procedure: ROBOTIC ASSISTED NAVIGATIONAL BRONCHOSCOPY (Right) - ION w/ CIOS   Anesthesia type: General   Diagnosis: Right middle lobe pulmonary nodule [R91.1]   Pre-op diagnosis: lung nodule   Location: [facility] / [place] [facility]   Surgeons: Garner Nash, DO       DISCUSSION: Patient is a 66 year old male scheduled for the above procedure. He had a RML lung nodule on previous 05/03/20 chest CT. Very low metabolic activity on PET scan then. On recent imaging, nodule had increased in size with mildly increased metabolic activity on PET scan. [place] oncologist Monica Martinez, MD recommended referral to thoracic surgery/pulmonology for biopsy and management.      History includes former smoker (quit 11/29/08), HTN, CAD (NSTEMI, s/p DES OM1, failed PCI occluded RCA 04/02/04; ACS 01/07/10, s/p cutting balloon arthrotomy, PTCA AV groove CX and ostial OM; CABG: LIMA-LAD, SVG-OM1, SVG-dRCA-OM2 09/18/10: LHC 12/25/15: patent SVG-OM, occluded SVG-RCA with left to right collaterals, atretic LIMA-LAD with no obstructive disease, medical therapy), PAD (s/p right SFA stent 01/08/09, left SFA stent 02/18/09; left SFA atherectomy, aspiration thrombectomy, balloon angioplasty left SFA 09/13/13; left SFA drug coated balloon angioplasty 08/29/14; right CFA endarterectomy 03/03/21), HLD, GERD, Bell's palsy, anemia, pancreatic cancer (s/p distal pancreatectomy and splenectomy 05/10/08, pathology: "solid pseudo-papillary tumor of the pancreas 9.8 cm.  Margins were negative but the tumor did appear to invade the capsule, consistent with a low grade malignancy"), DM2, pituitary macroadenoma (s/p transphenoidal resection of pituitary mass 01/04/19), OSA, RML pulmonary nodule (favored to be benign 05/29/20 PET scan, 6 month follow-up recommended), back surgery (L4-5 laminectomy 1990's), chronic back pain. Alcohol use is currently documented as  "occasional".   Last cardiology encounters see with Dr. Fletcher Anon was on 02/04/21 for PAD and CAD follow-up. Chronic atypical chest pain was stable. Primary issue was claudication. S/p PV angiogram followed by referral to vascular surgery. S/p right CFA endarterectomy with bovine pericardial patch angioplasty on 03/03/2021.  He denied any recent chest pain per PAT RN phone interview.  He has a same-day work-up, so anesthesia team to evaluate on the day of surgery.   VS:  BP Readings from Last 3 Encounters:  12/24/21 124/82  07/08/21 118/74  04/10/21 113/69   Pulse Readings from Last 3 Encounters:  12/24/21 (!) 55  07/08/21 66  04/10/21 (!) 51     PROVIDERS: [name], MD is PCP. His [facility] PCP is Bea Laura, MD.  Kathlyn Sacramento, MD is cardiologist. Has also seen Kirk Ruths, MD. Christinia Gully, MD is pulmonologist - Jamelle Haring, MD is vascular surgeon - Angelene Giovanni, MD is neurosurgeon (for pituitary resection). See [facility] Care Everywhere.  Monica Martinez, MD is HEM-[facility]C [facility]) - Silvestre Moment, MD is ENT [facility]). Seen 10/21/21 for right lateral tongue growth. 12/05/21 pathology report: Squamous  hyperplasia with parakeratosis and keratosis, focal subepithelial fibrosis associated with increased numbers of capillaries/small blood vessels, no dysplasia or malignancy identified, fungal stain negative.   LABS: Labs from the [facility] (see Care Everywhere) include: 12/31/21 CBC with diff showing hemoglobin 12.2, hematocrit 35.4, platelets 297, total bilirubin 0.6, albumin 4.4, alkaline phosphatase 75, AST 31, ALT 48, A1c 7.3%. Additional labs as indicated on the day of surgery.    IMAGES: PET Scan 01/05/22: IMPRESSION: - Continued enlargement of a RIGHT middle lobe pulmonary nodule with only mildly increased metabolic activity only slightly greater than mediastinal blood pool. Findings  are suspicious for indolent neoplasm with slight increase in metabolic activity relative  to mediastinal blood pool and with continued increase in size. The correlate with prior pathology determine whether mucinous neoplasm was found at primary pathology particularly of colon cancer at this can sometimes show low level uptake. - Stable appearance of nodularity about the LEFT adrenal gland and without signs of increased metabolic activity in this area. Continued attention on follow-up is suggested. To differentiate from splenic tissue/splenosis could consider heat damaged red blood cell study as warranted. - Post distal pancreatectomy and splenectomy. - Cholelithiasis. - Aortic atherosclerosis and signs of coronary artery disease post coronary revascularization.   CT Super D Chest 12/31/21: IMPRESSION: 1. There has been mild increase in size (on the order of 1-2 mm in each dimension) of the right middle lobe lung nodule which now has a maximum dimension of 2.2 cm. Of note: This nodule was present on remote study from 05/10/2009 measuring 5 mm. Low-grade indolent pulmonary neoplasm cannot be excluded. 2. Stable enlarged low right paratracheal lymph node compared with 05/03/2020. 3. Left adrenal adenoma. 4. Aortic Atherosclerosis (ICD10-I70.0) and Emphysema (ICD10-J43.9).    EKG:  Last EKG currently available 02/12/21: NSR   CV:  Cardiac cath (due to abnormal stress test 12/11/15) Prox Cx to Mid Cx lesion, 99% stenosed. The lesion was previously treated with a stent (unknown type). Prox LAD to Mid LAD lesion, 30% stenosed. Prox RCA lesion, 100% stenosed. SVG was injected is normal in caliber, and is anatomically normal. SVG was injected . Origin lesion, 100% stenosed. The left ventricular systolic function is normal.   1. Significant 2 vessel coronary artery disease with patent SVG to OM 2. SVG to RCA is occluded and LIMA to LAD is atretic. However, there is no obstructive disease affecting the LAD system. The right coronary artery is chronically occluded with good  left-to-right collaterals.   2. Normal LV systolic function and mildly elevated left ventricular end-diastolic pressure.   Recommendations: Continue aggressive medical therapy. The RCA does not appear favorable for CTO PCI.     Nuclear stress test 12/11/15: Nuclear stress EF: 46%. Defect 1: There is a medium defect of moderate severity present in the apex location This defect is fixed but that region of the LV contracts normally . There is also a small fixed defect in the inferior base that is consistent with a previous Inferiobasal MI. The Inferior wall is moderately hypokinetic.     Past Medical History:  Diagnosis Date   Anemia, unspecified    Bell's palsy    resolved - no deficits   Blood transfusion    during treatment for Ca   Blood transfusion without reported diagnosis    CAD (coronary artery disease)    a. s/p multiple PCIs;  b. s/p CABG in 09/2010 (LIMA-LAD, SVG-OM1, SVG-distal RCA/OM2);  Cardiac cath in 12/2015: Mild LAD disease, occluded mid LCX and proximal RCA (left to right collaterals), Occluded SVG to RCA and atretic LIMA. Patent SVG to OM   Chronic back pain    Diabetes mellitus without complication (HCC)    type 2   DJD (degenerative joint disease)    low back & all over    GERD (gastroesophageal reflux disease)    Helicobacter pylori (H. pylori) infection    Hemorrhoids    History of shingles    Hyperlipidemia    Hypertension    Impotence of organic origin    Myocardial infarction ([place])    2005 and 2012  PAD (peripheral artery disease) ([place])    a. s/p bilat SFA stents;  b. ABIs 11/2012: R 0.99, L 0.86.   Pancreatic cancer [facility])    surgery 2009 cyst removed   Pituitary adenoma [facility])    surgery at [facility] Feb 2020   Sleep apnea 06/05/2019   does not use cpap   Substance abuse ([place])    drug and alcohol abuse    Past Surgical History:  Procedure Laterality Date   ABDOMINAL AORTAGRAM N/A 09/13/2013   Procedure: ABDOMINAL Maxcine Ham;  Surgeon: Wellington Hampshire, MD;  Location: [place] CATH LAB;  Service: Cardiovascular;  Laterality: N/A;   ABDOMINAL AORTAGRAM N/A 08/29/2014   Procedure: ABDOMINAL Maxcine Ham;  Surgeon: Wellington Hampshire, MD;  Location: [place] CATH LAB;  Service: Cardiovascular;  Laterality: N/A;   ABDOMINAL AORTOGRAM W/LOWER EXTREMITY N/A 02/19/2021   Procedure: ABDOMINAL AORTOGRAM W/LOWER EXTREMITY;  Surgeon: Wellington Hampshire, MD;  Location: [place] CV LAB;  Service: Cardiovascular;  Laterality: N/A;   BACK SURGERY     1992   CARDIAC CATHETERIZATION N/A 12/25/2015   Procedure: Left Heart Cath and Cors/Grafts Angiography;  Surgeon: Wellington Hampshire, MD;  Location: [place] CV LAB;  Service: Cardiovascular;  Laterality: N/A;   COLONOSCOPY     CORONARY ARTERY BYPASS GRAFT  nov 2011   x 4   ENDARTERECTOMY FEMORAL Right 03/03/2021   Procedure: RIGHT FEMORAL ENDARTERECTOMY;  Surgeon: Cherre Robins, MD;  Location: [place];  Service: Vascular;  Laterality: Right;   ESOPHAGOGASTRODUODENOSCOPY (EGD) WITH PROPOFOL N/A 04/02/2017   Procedure: ESOPHAGOGASTRODUODENOSCOPY (EGD) WITH PROPOFOL;  Surgeon: Carol Ada, MD;  Location: [facility] ENDOS[facility];  Service: Endoscopy;  Laterality: N/A;   HEMORRHOID SURGERY  10/30/2011   Procedure: HEMORRHOIDECTOMY;  Surgeon: Harl Bowie, MD;  Location: [place];  Service: General;  Laterality: N/A;   JOINT REPLACEMENT     pancreatic  cancer  05/10/2008   Resection of distal panrease and spleen   PARTIAL KNEE ARTHROPLASTY Right 11/19/2014   Procedure: RIGHT UNI-KNEE ARTHROPLASTY MEDIALLY;  Surgeon: Mauri Pole, MD;  Location: [facility];  Service: Orthopedics;  Laterality: Right;   PATCH ANGIOPLASTY Right 03/03/2021   Procedure: PATCH ANGIOPLASTY;  Surgeon: Cherre Robins, MD;  Location: [place];  Service: Vascular;  Laterality: Right;   PERIPHERAL VASCULAR CATHETERIZATION  Oct 2014   Lt SFA PTA   PERIPHERAL VASCULAR CATHETERIZATION  Oct 2015   ISR Lt SFA-PTA   right partial knee replacement     splenectomy      THROMBECTOMY FEMORAL ARTERY Left 09/13/2013   Procedure: THROMBECTOMY FEMORAL ARTERY;  Surgeon: Wellington Hampshire, MD;  Location: [place] CATH LAB;  Service: Cardiovascular;  Laterality: Left;   TRANSPHENOIDAL / TRANSNASAL HYPOPHYSECTOMY / RESECTION PITUITARY TUMOR  12/2018   [facility]    MEDICATIONS: No current facility-administered medications for this encounter.    amoxicillin (AMOXIL) 500 MG capsule   aspirin EC 81 MG tablet   atorvastatin (LIPITOR) 40 MG tablet   DULoxetine HCl 30 MG CSDR   ezetimibe (ZETIA) 10 MG tablet   fish oil-omega-3 fatty acids 1000 MG capsule   levothyroxine (SYNTHROID) 25 MCG tablet   lisinopril (ZESTRIL) 2.5 MG tablet   metFORMIN (GLUCOPHAGE) 500 MG tablet   metoprolol tartrate (LOPRESSOR) 25 MG tablet   morphine (MSIR) 15 MG tablet   Multiple Vitamin (MULTIVITAMIN) tablet   naproxen (NAPROSYN) 500 MG tablet   nitroGLYCERIN (NITROSTAT) 0.4 MG SL tablet   oxyCODONE-acetaminophen (PERCOCET) 5-325 MG tablet   pantoprazole (PROTONIX)  40 MG tablet   polyethylene glycol (MIRALAX / GLYCOLAX) 17 g packet   predniSONE (DELTASONE) 20 MG tablet   pregabalin (LYRICA) 150 MG capsule   Propylene Glycol (SYSTANE BALANCE) 0.6 % SOLN   sildenafil (VIAGRA) 100 MG tablet   valACYclovir (VALTREX) 1000 MG tablet    Myra Gianotti, PA-C Surgical Short Stay/Anesthesiology [facility] Phone [phone] [facility] Phone [phone] 01/12/2022 2:42 PM

## 2022-01-13 ENCOUNTER — Ambulatory Visit (HOSPITAL_COMMUNITY)
Admission: RE | Admit: 2022-01-13 | Discharge: 2022-01-13 | Disposition: A | Discharge status: Home / Self Care | Payer: No Typology Code available for payment source | Attending: Pulmonary Disease | Admitting: Pulmonary Disease

## 2022-01-13 ENCOUNTER — Encounter (HOSPITAL_COMMUNITY): Payer: Self-pay | Admitting: Pulmonary Disease

## 2022-01-13 ENCOUNTER — Ambulatory Visit (HOSPITAL_BASED_OUTPATIENT_CLINIC_OR_DEPARTMENT_OTHER): Payer: No Typology Code available for payment source | Admitting: Vascular Surgery

## 2022-01-13 ENCOUNTER — Ambulatory Visit (HOSPITAL_COMMUNITY): Payer: No Typology Code available for payment source

## 2022-01-13 ENCOUNTER — Ambulatory Visit (HOSPITAL_COMMUNITY): Payer: No Typology Code available for payment source | Admitting: Vascular Surgery

## 2022-01-13 ENCOUNTER — Encounter (HOSPITAL_COMMUNITY)
Admission: RE | Disposition: A | Discharge status: Home / Self Care | Payer: Self-pay | Source: Home / Self Care | Attending: Pulmonary Disease

## 2022-01-13 ENCOUNTER — Other Ambulatory Visit: Payer: Self-pay

## 2022-01-13 DIAGNOSIS — Z79890 Hormone replacement therapy (postmenopausal): Secondary | ICD-10-CM | POA: Diagnosis not present

## 2022-01-13 DIAGNOSIS — Z87891 Personal history of nicotine dependence: Secondary | ICD-10-CM | POA: Diagnosis not present

## 2022-01-13 DIAGNOSIS — Z951 Presence of aortocoronary bypass graft: Secondary | ICD-10-CM | POA: Diagnosis not present

## 2022-01-13 DIAGNOSIS — I700 Atherosclerosis of aorta: Secondary | ICD-10-CM | POA: Diagnosis not present

## 2022-01-13 DIAGNOSIS — Z20822 Contact with and (suspected) exposure to covid-19: Secondary | ICD-10-CM | POA: Insufficient documentation

## 2022-01-13 DIAGNOSIS — Z9889 Other specified postprocedural states: Secondary | ICD-10-CM

## 2022-01-13 DIAGNOSIS — I252 Old myocardial infarction: Secondary | ICD-10-CM

## 2022-01-13 DIAGNOSIS — J439 Emphysema, unspecified: Secondary | ICD-10-CM | POA: Insufficient documentation

## 2022-01-13 DIAGNOSIS — D3502 Benign neoplasm of left adrenal gland: Secondary | ICD-10-CM | POA: Insufficient documentation

## 2022-01-13 DIAGNOSIS — I10 Essential (primary) hypertension: Secondary | ICD-10-CM | POA: Diagnosis not present

## 2022-01-13 DIAGNOSIS — G4730 Sleep apnea, unspecified: Secondary | ICD-10-CM | POA: Diagnosis not present

## 2022-01-13 DIAGNOSIS — I2510 Atherosclerotic heart disease of native coronary artery without angina pectoris: Secondary | ICD-10-CM

## 2022-01-13 DIAGNOSIS — I739 Peripheral vascular disease, unspecified: Secondary | ICD-10-CM | POA: Diagnosis not present

## 2022-01-13 DIAGNOSIS — E039 Hypothyroidism, unspecified: Secondary | ICD-10-CM | POA: Insufficient documentation

## 2022-01-13 DIAGNOSIS — R911 Solitary pulmonary nodule: Secondary | ICD-10-CM | POA: Diagnosis not present

## 2022-01-13 DIAGNOSIS — Z419 Encounter for procedure for purposes other than remedying health state, unspecified: Secondary | ICD-10-CM

## 2022-01-13 DIAGNOSIS — C7A090 Malignant carcinoid tumor of the bronchus and lung: Secondary | ICD-10-CM | POA: Insufficient documentation

## 2022-01-13 DIAGNOSIS — Z8507 Personal history of malignant neoplasm of pancreas: Secondary | ICD-10-CM | POA: Diagnosis not present

## 2022-01-13 DIAGNOSIS — Z7984 Long term (current) use of oral hypoglycemic drugs: Secondary | ICD-10-CM | POA: Diagnosis not present

## 2022-01-13 DIAGNOSIS — K219 Gastro-esophageal reflux disease without esophagitis: Secondary | ICD-10-CM | POA: Diagnosis not present

## 2022-01-13 HISTORY — PX: BRONCHIAL BRUSHINGS: SHX5108

## 2022-01-13 HISTORY — DX: Other psychoactive substance abuse, uncomplicated: F19.10

## 2022-01-13 HISTORY — PX: VIDEO BRONCHOSCOPY WITH RADIAL ENDOBRONCHIAL ULTRASOUND: SHX6849

## 2022-01-13 HISTORY — DX: Personal history of other infectious and parasitic diseases: Z86.19

## 2022-01-13 HISTORY — PX: BRONCHIAL BIOPSY: SHX5109

## 2022-01-13 HISTORY — PX: BRONCHIAL NEEDLE ASPIRATION BIOPSY: SHX5106

## 2022-01-13 LAB — CBC
HCT: 37.8 % — ABNORMAL LOW (ref 39.0–52.0)
Hemoglobin: 12 g/dL — ABNORMAL LOW (ref 13.0–17.0)
MCH: 29.2 pg (ref 26.0–34.0)
MCHC: 31.7 g/dL (ref 30.0–36.0)
MCV: 92 fL (ref 80.0–100.0)
Platelets: 318 10*3/uL (ref 150–400)
RBC: 4.11 MIL/uL — ABNORMAL LOW (ref 4.22–5.81)
RDW: 14.6 % (ref 11.5–15.5)
WBC: 4.1 10*3/uL (ref 4.0–10.5)
nRBC: 0 % (ref 0.0–0.2)

## 2022-01-13 LAB — BASIC METABOLIC PANEL WITH GFR
Anion gap: 9 (ref 5–15)
BUN: 6 mg/dL — ABNORMAL LOW (ref 8–23)
CO2: 25 mmol/L (ref 22–32)
Calcium: 9.5 mg/dL (ref 8.9–10.3)
Chloride: 105 mmol/L (ref 98–111)
Creatinine, Ser: 0.94 mg/dL (ref 0.61–1.24)
GFR, Estimated: >60 mL/min
Glucose, Bld: 151 mg/dL — ABNORMAL HIGH (ref 70–99)
Potassium: 4.2 mmol/L (ref 3.5–5.1)
Sodium: 139 mmol/L (ref 135–145)

## 2022-01-13 LAB — GLUCOSE, CAPILLARY
Glucose-Capillary: 169 mg/dL — ABNORMAL HIGH (ref 70–99)
Glucose-Capillary: 133 mg/dL — ABNORMAL HIGH (ref 70–99)
Glucose-Capillary: 130 mg/dL — ABNORMAL HIGH (ref 70–99)

## 2022-01-13 LAB — SARS CORONAVIRUS 2 BY RT PCR (HOSPITAL ORDER, PERFORMED IN ~~LOC~~ HOSPITAL LAB): SARS Coronavirus 2: NEGATIVE

## 2022-01-13 SURGERY — BRONCHOSCOPY, WITH BIOPSY USING ELECTROMAGNETIC NAVIGATION
Anesthesia: General | Laterality: Right

## 2022-01-13 MED ORDER — ROCURONIUM BROMIDE 10 MG/ML (PF) SYRINGE
PREFILLED_SYRINGE | INTRAVENOUS | Status: DC | PRN
  Administered 2022-01-13: 60 mg via INTRAVENOUS

## 2022-01-13 MED ORDER — FENTANYL CITRATE (PF) 100 MCG/2ML IJ SOLN
INTRAMUSCULAR | Status: DC | PRN
  Administered 2022-01-13: 100 ug via INTRAVENOUS

## 2022-01-13 MED ORDER — ONDANSETRON HCL 4 MG/2ML IJ SOLN
INTRAMUSCULAR | Status: DC | PRN
Start: 2022-01-13 — End: 2022-01-13
  Administered 2022-01-13: 4 mg via INTRAVENOUS

## 2022-01-13 MED ORDER — CHLORHEXIDINE GLUCONATE 0.12 % MT SOLN
OROMUCOSAL | Status: AC
  Administered 2022-01-13: 15 mL via OROMUCOSAL
  Filled 2022-01-13: qty 15

## 2022-01-13 MED ORDER — INSULIN ASPART 100 UNIT/ML IJ SOLN
0.0000 [IU] | INTRAMUSCULAR | Status: DC | PRN
  Administered 2022-01-13: 2 [IU] via SUBCUTANEOUS
  Filled 2022-01-13: qty 1

## 2022-01-13 MED ORDER — PROPOFOL 10 MG/ML IV BOLUS
INTRAVENOUS | Status: DC | PRN
  Administered 2022-01-13: 120 mg via INTRAVENOUS

## 2022-01-13 MED ORDER — EPHEDRINE SULFATE-NACL 50-0.9 MG/10ML-% IV SOSY
PREFILLED_SYRINGE | INTRAVENOUS | Status: DC | PRN
  Administered 2022-01-13 (×2): 10 mg via INTRAVENOUS
  Administered 2022-01-13: 5 mg via INTRAVENOUS

## 2022-01-13 MED ORDER — DEXAMETHASONE SODIUM PHOSPHATE 10 MG/ML IJ SOLN
INTRAMUSCULAR | Status: DC | PRN
  Administered 2022-01-13: 10 mg via INTRAVENOUS

## 2022-01-13 MED ORDER — MIDAZOLAM HCL 2 MG/2ML IJ SOLN
INTRAMUSCULAR | Status: DC | PRN
  Administered 2022-01-13: 2 mg via INTRAVENOUS

## 2022-01-13 MED ORDER — PHENYLEPHRINE 40 MCG/ML (10ML) SYRINGE FOR IV PUSH (FOR BLOOD PRESSURE SUPPORT)
PREFILLED_SYRINGE | INTRAVENOUS | Status: DC | PRN
  Administered 2022-01-13: 80 ug via INTRAVENOUS
  Administered 2022-01-13: 700 ug via INTRAVENOUS

## 2022-01-13 MED ORDER — LIDOCAINE 2% (20 MG/ML) 5 ML SYRINGE
INTRAMUSCULAR | Status: DC | PRN
  Administered 2022-01-13: 100 mg via INTRAVENOUS

## 2022-01-13 MED ORDER — LACTATED RINGERS IV SOLN: INTRAVENOUS | Status: DC

## 2022-01-13 MED ORDER — SUGAMMADEX SODIUM 200 MG/2ML IV SOLN
INTRAVENOUS | Status: DC | PRN
  Administered 2022-01-13: 400 mg via INTRAVENOUS

## 2022-01-13 MED ORDER — CHLORHEXIDINE GLUCONATE 0.12 % MT SOLN
15.0000 mL | Freq: Once | OROMUCOSAL | Status: AC
  Filled 2022-01-13: qty 15

## 2022-01-13 NOTE — Anesthesia Postprocedure Evaluation (Signed)
Anesthesia Post Note  Patient: Steven Hale.  Procedure(s) Performed: ROBOTIC ASSISTED NAVIGATIONAL BRONCHOSCOPY (Right) BRONCHIAL BIOPSIES BRONCHIAL NEEDLE ASPIRATION BIOPSIES BRONCHIAL BRUSHINGS RADIAL ENDOBRONCHIAL ULTRASOUND     Patient location during evaluation: PACU Anesthesia Type: General Level of consciousness: awake and alert and oriented Pain management: pain level controlled Vital Signs Assessment: post-procedure vital signs reviewed and stable Respiratory status: spontaneous breathing, nonlabored ventilation and respiratory function stable Cardiovascular status: blood pressure returned to baseline and stable Postop Assessment: no apparent nausea or vomiting Anesthetic complications: no   No notable events documented.  Last Vitals:  Vitals:   01/13/22 1040 01/13/22 1055  BP: 125/79 112/74  Pulse: 70 69  Resp: 14 14  Temp:    SpO2: 91% 94%    Last Pain:  Vitals:   01/13/22 1040  TempSrc:   PainSc: 0-No pain                 Celestine Prim A.

## 2022-01-13 NOTE — Op Note (Signed)
Video Bronchoscopy with Robotic Assisted Bronchoscopic Navigation   Date of Operation: 01/13/2022   Pre-op Diagnosis: Right middle lobe lung nodule  Post-op Diagnosis: Right middle lobe lung nodule  Surgeon: Garner Nash, DO  Assistants: None   Anesthesia: General endotracheal anesthesia  Operation: Flexible video fiberoptic bronchoscopy with robotic assistance and biopsies.  Estimated Blood Loss: Minimal  Complications: None  Indications and History: Steven Hale. is a 66 y.o. male with history of right middle lobe lung nodule. The risks, benefits, complications, treatment options and expected outcomes were discussed with the patient.  The possibilities of pneumothorax, pneumonia, reaction to medication, pulmonary aspiration, perforation of a viscus, bleeding, failure to diagnose a condition and creating a complication requiring transfusion or operation were discussed with the patient who freely signed the consent.    Description of Procedure: The patient was seen in the Preoperative Area, was examined and was deemed appropriate to proceed.  The patient was taken to [facility] endoscopy room 3, identified as Steven Hale. and the procedure verified as Flexible Video Fiberoptic Bronchoscopy.  A Time Out was held and the above information confirmed.   Prior to the date of the procedure a high-resolution CT scan of the chest was performed. Utilizing ION software program a virtual tracheobronchial tree was generated to allow the creation of distinct navigation pathways to the patient's parenchymal abnormalities. After being taken to the operating room general anesthesia was initiated and the patient  was orally intubated. The video fiberoptic bronchoscope was introduced via the endotracheal tube and a general inspection was performed which showed normal right and left lung anatomy, aspiration of the bilateral mainstems was completed to remove any remaining secretions. Robotic catheter  inserted into patient's endotracheal tube.   Target #1 right middle lobe lung nodule: The distinct navigation pathways prepared prior to this procedure were then utilized to navigate to patient's lesion identified on CT scan. The robotic catheter was secured into place and the vision probe was withdrawn.  Lesion location was approximated using fluoroscopy and radial endobronchial ultrasound for peripheral targeting. Under fluoroscopic guidance transbronchial needle brushings, transbronchial needle biopsies, and transbronchial forceps biopsies were performed to be sent for cytology and pathology.   At the end of the procedure a general airway inspection was performed and there was no evidence of active bleeding. The bronchoscope was removed.  The patient tolerated the procedure well. There was no significant blood loss and there were no obvious complications. A post-procedural chest x-ray is pending.  Samples Target #1: 1. Transbronchial needle brushings from right middle lobe lung nodule 2. Transbronchial Wang needle biopsies from right middle lobe lung nodule 3. Transbronchial forceps biopsies from right middle lobe lung nodule  Plans:  The patient will be discharged from the PACU to home when recovered from anesthesia and after chest x-ray is reviewed. We will review the cytology, pathology results with the patient when they become available. Outpatient followup will be with Garner Nash, DO.   Garner Nash, DO [place] [facility] 01/13/2022 10:06 AM

## 2022-01-13 NOTE — Anesthesia Procedure Notes (Signed)
Procedure Name: Intubation Date/Time: 01/13/2022 9:14 AM Performed by: Janace Litten, CRNA Pre-anesthesia Checklist: Patient identified, Emergency Drugs available, Suction available and Patient being monitored Patient Re-evaluated:Patient Re-evaluated prior to induction Oxygen Delivery Method: Circle System Utilized Preoxygenation: Pre-oxygenation with 100% oxygen Induction Type: IV induction Ventilation: Mask ventilation without difficulty Laryngoscope Size: Mac and 4 Grade View: Grade II Tube type: Oral Tube size: 8.5 mm Number of attempts: 1 Airway Equipment and Method: Stylet and Oral airway Placement Confirmation: ETT inserted through vocal cords under direct vision, positive ETCO2 and breath sounds checked- equal and bilateral Secured at: 22 cm Tube secured with: Tape Dental Injury: Teeth and Oropharynx as per pre-operative assessment

## 2022-01-13 NOTE — Discharge Instructions (Signed)
Flexible Bronchoscopy, Care After This sheet gives you information about how to care for yourself after your test. Your doctor may also give you more specific instructions. If you have problems or questions, contact your doctor. Follow these instructions at home: Eating and drinking Do not eat or drink anything (not even water) for 2 hours after your test, or until your numbing medicine (local anesthetic) wears off. When your numbness is gone and your cough and gag reflexes have come back, you may: Eat only soft foods. Slowly drink liquids. The day after the test, go back to your normal diet. Driving Do not drive for 24 hours if you were given a medicine to help you relax (sedative). Do not drive or use heavy machinery while taking prescription pain medicine. General instructions  Take over-the-counter and prescription medicines only as told by your doctor. Return to your normal activities as told. Ask what activities are safe for you. Do not use any products that have nicotine or tobacco in them. This includes cigarettes and e-cigarettes. If you need help quitting, ask your doctor. Keep all follow-up visits as told by your doctor. This is important. It is very important if you had a tissue sample (biopsy) taken. Get help right away if: You have shortness of breath that gets worse. You get light-headed. You feel like you are going to pass out (faint). You have chest pain. You cough up: More than a little blood. More blood than before. Summary Do not eat or drink anything (not even water) for 2 hours after your test, or until your numbing medicine wears off. Do not use cigarettes. Do not use e-cigarettes. Get help right away if you have chest pain.  This information is not intended to replace advice given to you by your health care provider. Make sure you discuss any questions you have with your health care provider. Document Released: 08/30/2009 Document Revised: 10/15/2017 Document  Reviewed: 11/20/2016 Elsevier Patient Education  2020 [employer].

## 2022-01-13 NOTE — Interval H&P Note (Signed)
History and Physical Interval Note:  01/13/2022 7:43 AM  Steven Hale.  has presented today for surgery, with the diagnosis of lung nodule.  The various methods of treatment have been discussed with the patient and family. After consideration of risks, benefits and other options for treatment, the patient has consented to  Procedure(s) with comments: ROBOTIC ASSISTED NAVIGATIONAL BRONCHOSCOPY (Right) - ION w/ CIOS as a surgical intervention.  The patient's history has been reviewed, patient examined, no change in status, stable for surgery.  I have reviewed the patient's chart and labs.  Questions were answered to the patient's satisfaction.     [place]

## 2022-01-13 NOTE — Transfer of Care (Signed)
Immediate Anesthesia Transfer of Care Note  Patient: Steven Hale.  Procedure(s) Performed: ROBOTIC ASSISTED NAVIGATIONAL BRONCHOSCOPY (Right) BRONCHIAL BIOPSIES BRONCHIAL NEEDLE ASPIRATION BIOPSIES BRONCHIAL BRUSHINGS RADIAL ENDOBRONCHIAL ULTRASOUND  Patient Location: PACU  Anesthesia Type:General  Level of Consciousness: drowsy, patient cooperative and responds to stimulation  Airway & Oxygen Therapy: Patient Spontanous Breathing and Patient connected to nasal cannula oxygen  Post-op Assessment: Report given to RN and Post -op Vital signs reviewed and stable  Post vital signs: Reviewed and stable  Last Vitals:  Vitals Value Taken Time  BP 149/81 01/13/22 1009  Temp    Pulse 77 01/13/22 1011  Resp 14 01/13/22 1011  SpO2 94 % 01/13/22 1011  Vitals shown include unvalidated device data.  Last Pain:  Vitals:   01/13/22 0711  TempSrc:   PainSc: 0-No pain         Complications: No notable events documented.

## 2022-01-14 ENCOUNTER — Encounter (HOSPITAL_COMMUNITY): Payer: Self-pay | Admitting: Pulmonary Disease

## 2022-01-15 LAB — CYTOLOGY - NON PAP

## 2022-01-16 ENCOUNTER — Other Ambulatory Visit (HOSPITAL_COMMUNITY): Payer: Self-pay

## 2022-01-20 ENCOUNTER — Encounter: Payer: Self-pay | Admitting: Cardiovascular Disease

## 2022-01-20 ENCOUNTER — Other Ambulatory Visit: Payer: Self-pay

## 2022-01-20 ENCOUNTER — Ambulatory Visit (INDEPENDENT_AMBULATORY_CARE_PROVIDER_SITE_OTHER): Payer: No Typology Code available for payment source | Admitting: Cardiovascular Disease

## 2022-01-20 ENCOUNTER — Telehealth: Payer: Self-pay | Admitting: Acute Care

## 2022-01-20 ENCOUNTER — Ambulatory Visit: Payer: Non-veteran care | Admitting: Acute Care

## 2022-01-20 VITALS — BP 118/70 | HR 52 | Ht 69.0 in | Wt 236.2 lb

## 2022-01-20 DIAGNOSIS — I25118 Atherosclerotic heart disease of native coronary artery with other forms of angina pectoris: Secondary | ICD-10-CM

## 2022-01-20 DIAGNOSIS — I10 Essential (primary) hypertension: Secondary | ICD-10-CM | POA: Diagnosis not present

## 2022-01-20 DIAGNOSIS — I739 Peripheral vascular disease, unspecified: Secondary | ICD-10-CM

## 2022-01-20 DIAGNOSIS — E785 Hyperlipidemia, unspecified: Secondary | ICD-10-CM | POA: Diagnosis not present

## 2022-01-20 NOTE — Progress Notes (Signed)
Cardiology Office Note   Date:  01/20/2022   ID:  [name], DOB 1956/09/11, MRN [id number]  PCP:  Tana Coast, MD  Cardiologist:  Dr. Jamesetta Geralds  No chief complaint on file.     History of Present Illness: Steven Hale. is a 66 y.o. male who presents for a followup visit regarding peripheral arterial disease and coronary artery disease. He has known history of coronary artery disease with multiple interventions in the past. He had CABG in 2011.  He quit smoking in 2009.  He had pituitary adenoma resection in February of this year. He has known history of peripheral arterial disease with intervention on both SFAs. Cardiac catheterization in January, 2017 showed mild nonobstructive LAD disease, occluded mid left circumflex and occluded proximal right coronary artery with left-to-right collaterals. SVG to RCA was occluded and LIMA to LAD was atretic. SVG to OM was normal. Abnormal stress test was due to chronically occluded right coronary artery and graft with left-to-right collaterals. His native RCA was not favorable for CTO PCI. I recommended medical therapy. Imdur was added.   His most recent visit with me was 1 year ago and at that time he complained of severe right calf claudication.  I proceeded with angiography in April 2022 which showed no significant aortoiliac disease.  On the right side, there was severe calcified stenosis in the common femoral artery with patent SFA stent with moderate in-stent restenosis and two-vessel runoff below the knee.  On the left side, there was mild common femoral artery disease, patent SFA stent with minimal restenosis and two-vessel runoff below the knee. The patient underwent right common femoral artery endarterectomy by Dr. Stanford Breed in April 2022. He has been following with pulmonary regarding pulmonary nodule.  He might require surgery in the near future and he has a consultation scheduled with Dr. Kipp Brood.  He reports minimal  chest discomfort and no significant shortness of breath.  Right lower extremity claudication improved significantly after most recent right common femoral artery endarterectomy.  He does complain of numbness in both feet and has known history of peripheral neuropathy.  Past Medical History:  Diagnosis Date   Anemia, unspecified    Bell's palsy    resolved - no deficits   Blood transfusion    during treatment for Ca   Blood transfusion without reported diagnosis    CAD (coronary artery disease)    a. s/p multiple PCIs;  b. s/p CABG in 09/2010 (LIMA-LAD, SVG-OM1, SVG-distal RCA/OM2);  Cardiac cath in 12/2015: Mild LAD disease, occluded mid LCX and proximal RCA (left to right collaterals), Occluded SVG to RCA and atretic LIMA. Patent SVG to OM   Chronic back pain    Diabetes mellitus without complication (HCC)    type 2   DJD (degenerative joint disease)    low back & all over    GERD (gastroesophageal reflux disease)    Helicobacter pylori (H. pylori) infection    Hemorrhoids    History of shingles    Hyperlipidemia    Hypertension    Impotence of organic origin    Myocardial infarction ([place])    2005 and 2012    PAD (peripheral artery disease) ([place])    a. s/p bilat SFA stents;  b. ABIs 11/2012: R 0.99, L 0.86.   Pancreatic cancer [facility])    surgery 2009 cyst removed   Pituitary adenoma [facility])    surgery at [facility] Feb 2020   Sleep apnea 06/05/2019   does not use  cpap   Substance abuse (HCC)    drug and alcohol abuse    Past Surgical History:  Procedure Laterality Date   ABDOMINAL AORTAGRAM N/A 09/13/2013   Procedure: ABDOMINAL AORTAGRAM;  Surgeon: Wellington Hampshire, MD;  Location: [place] CATH LAB;  Service: Cardiovascular;  Laterality: N/A;   ABDOMINAL AORTAGRAM N/A 08/29/2014   Procedure: ABDOMINAL Maxcine Ham;  Surgeon: Wellington Hampshire, MD;  Location: [place] CATH LAB;  Service: Cardiovascular;  Laterality: N/A;   ABDOMINAL AORTOGRAM W/LOWER EXTREMITY N/A 02/19/2021   Procedure: ABDOMINAL  AORTOGRAM W/LOWER EXTREMITY;  Surgeon: Wellington Hampshire, MD;  Location: [place] CV LAB;  Service: Cardiovascular;  Laterality: N/A;   BACK SURGERY     1992   BRONCHIAL BIOPSY  01/13/2022   Procedure: BRONCHIAL BIOPSIES;  Surgeon: Garner Nash, DO;  Location: [place];  Service: Pulmonary;;   BRONCHIAL BRUSHINGS  01/13/2022   Procedure: BRONCHIAL BRUSHINGS;  Surgeon: Garner Nash, DO;  Location: [place];  Service: Pulmonary;;   BRONCHIAL NEEDLE ASPIRATION BIOPSY  01/13/2022   Procedure: BRONCHIAL NEEDLE ASPIRATION BIOPSIES;  Surgeon: Garner Nash, DO;  Location: [place];  Service: Pulmonary;;   CARDIAC CATHETERIZATION N/A 12/25/2015   Procedure: Left Heart Cath and Cors/Grafts Angiography;  Surgeon: Wellington Hampshire, MD;  Location: [place] CV LAB;  Service: Cardiovascular;  Laterality: N/A;   COLONOSCOPY     CORONARY ARTERY BYPASS GRAFT  nov 2011   x 4   ENDARTERECTOMY FEMORAL Right 03/03/2021   Procedure: RIGHT FEMORAL ENDARTERECTOMY;  Surgeon: Cherre Robins, MD;  Location: [place];  Service: Vascular;  Laterality: Right;   ESOPHAGOGASTRODUODENOSCOPY (EGD) WITH PROPOFOL N/A 04/02/2017   Procedure: ESOPHAGOGASTRODUODENOSCOPY (EGD) WITH PROPOFOL;  Surgeon: Carol Ada, MD;  Location: [facility];  Service: Endoscopy;  Laterality: N/A;   HEMORRHOID SURGERY  10/30/2011   Procedure: HEMORRHOIDECTOMY;  Surgeon: Harl Bowie, MD;  Location: [place];  Service: General;  Laterality: N/A;   JOINT REPLACEMENT     pancreatic  cancer  05/10/2008   Resection of distal panrease and spleen   PARTIAL KNEE ARTHROPLASTY Right 11/19/2014   Procedure: RIGHT UNI-KNEE ARTHROPLASTY MEDIALLY;  Surgeon: Mauri Pole, MD;  Location: [facility];  Service: Orthopedics;  Laterality: Right;   PATCH ANGIOPLASTY Right 03/03/2021   Procedure: PATCH ANGIOPLASTY;  Surgeon: Cherre Robins, MD;  Location: [place];  Service: Vascular;  Laterality: Right;   PERIPHERAL VASCULAR CATHETERIZATION  Oct  2014   Lt SFA PTA   PERIPHERAL VASCULAR CATHETERIZATION  Oct 2015   ISR Lt SFA-PTA   right partial knee replacement     splenectomy     THROMBECTOMY FEMORAL ARTERY Left 09/13/2013   Procedure: THROMBECTOMY FEMORAL ARTERY;  Surgeon: Wellington Hampshire, MD;  Location: [place] CATH LAB;  Service: Cardiovascular;  Laterality: Left;   TRANSPHENOIDAL / TRANSNASAL HYPOPHYSECTOMY / RESECTION PITUITARY TUMOR  12/2018   [facility]   VIDEO BRONCHOSCOPY WITH RADIAL ENDOBRONCHIAL ULTRASOUND  01/13/2022   Procedure: RADIAL ENDOBRONCHIAL ULTRASOUND;  Surgeon: Garner Nash, DO;  Location: [facility];  Service: Pulmonary;;     Current Outpatient Medications  Medication Sig Dispense Refill   amoxicillin (AMOXIL) 500 MG capsule TAKE 4 CAPSULES BY MOUTH 1 HOUR PRIOR TO DENTAL APPOINTMENT 20 capsule 1   aspirin EC 81 MG tablet Take 1 tablet (81 mg total) by mouth daily.     atorvastatin (LIPITOR) 40 MG tablet Take 1 tablet (40 mg total) by mouth daily. (Patient taking differently: Take 40 mg by mouth  at bedtime.) 90 tablet 3   DULoxetine HCl 30 MG CSDR Take 30 mg by mouth 2 (two) times daily.     ezetimibe (ZETIA) 10 MG tablet Take 1 tablet by mouth daily. Patient must schedule annual appointment for future refills 90 tablet 0   fish oil-omega-3 fatty acids 1000 MG capsule Take 1 capsule (1 g total) by mouth daily. 60 capsule 1   levothyroxine (SYNTHROID) 25 MCG tablet Take 25 mcg by mouth daily.     lisinopril (ZESTRIL) 2.5 MG tablet Take 2.5 mg by mouth daily.     metFORMIN (GLUCOPHAGE) 500 MG tablet Take 1 tablet (500 mg total) by mouth daily with breakfast. (Patient taking differently: Take 500 mg by mouth 2 (two) times daily with a meal.) 90 tablet 3   metoprolol tartrate (LOPRESSOR) 25 MG tablet Take 1 tablet by mouth two times daily. PATIENT NEEDS TO SCHEDULE AND OFFICE VISIT FOR FUTURE REFILLS. 60 tablet 0   Multiple Vitamin (MULTIVITAMIN) tablet Take 1 tablet by mouth daily. 30 tablet 10   naproxen (NAPROSYN)  500 MG tablet Take 1 tablet (500 mg total) by mouth 2 (two) times daily. 30 tablet 0   nitroGLYCERIN (NITROSTAT) 0.4 MG SL tablet Place 0.4 mg under the tongue every 2 (two) hours as needed for chest pain.     pantoprazole (PROTONIX) 40 MG tablet Take 1 tablet (40 mg total) by mouth daily. 30 tablet 0   pregabalin (LYRICA) 150 MG capsule Take 1 capsule (150 mg total) by mouth daily. (Patient taking differently: Take 300 mg by mouth 2 (two) times daily.) 30 capsule 0   Propylene Glycol (SYSTANE BALANCE) 0.6 % SOLN Apply 1 drop to eye 4 (four) times daily.     sildenafil (VIAGRA) 100 MG tablet Take 0.5-1 tablets (50-100 mg total) by mouth daily as needed for erectile dysfunction. 5 tablet 11   No current facility-administered medications for this visit.    Allergies:   Patient has no known allergies.    Social History:  The patient  reports that he quit smoking about 2 years ago. His smoking use included cigarettes. He has a 18.00 pack-year smoking history. He has never used smokeless tobacco. He reports current alcohol use. He reports that he does not use drugs.   Family History:  The patient's family history includes Cancer in his father; Heart attack in his father.    ROS:  Please see the history of present illness.   Otherwise, review of systems are positive for none.   All other systems are reviewed and negative.    PHYSICAL EXAM: VS:  BP 118/70 (BP Location: Left Arm, Patient Position: Sitting)    Pulse (!) 52    Ht 5\' 9"  (1.753 m)    Wt 236 lb 3.2 oz (107.1 kg)    SpO2 94%    BMI 34.88 kg/m  , BMI Body mass index is 34.88 kg/m. GEN: Well nourished, well developed, in no acute distress  HEENT: normal  Neck: no JVD, carotid bruits, or masses Cardiac: RRR; no murmurs, rubs, or gallops,no edema  Respiratory:  clear to auscultation bilaterally, normal work of breathing GI: soft, nontender, nondistended, + BS MS: no deformity or atrophy  Skin: warm and dry, no rash Neuro:  Strength  and sensation are intact Psych: euthymic mood, full affect Femoral: +2 bilaterally.  EKG:  EKG is ordered today. EKG showed sinus bradycardia with no significant ST or T wave changes.   Recent Labs: 03/09/2021: ALT 20 01/13/2022: BUN  6; Creatinine, Ser 0.94; Hemoglobin 12.0; Platelets 318; Potassium 4.2; Sodium 139    Lipid Panel    Component Value Date/Time   CHOL 104 03/04/2021 0535   CHOL 107 08/09/2020 1549   TRIG 87 03/04/2021 0535   HDL 36 (L) 03/04/2021 0535   HDL 33 (L) 08/09/2020 1549   CHOLHDL 2.9 03/04/2021 0535   VLDL 17 03/04/2021 0535   LDLCALC 51 03/04/2021 0535   LDLCALC 49 08/09/2020 1549   LDLDIRECT 80 11/06/2013 0925      Wt Readings from Last 3 Encounters:  01/20/22 236 lb 3.2 oz (107.1 kg)  01/13/22 230 lb (104.3 kg)  12/24/21 235 lb 6.4 oz (106.8 kg)        ASSESSMENT AND PLAN:   1. Peripheral arterial disease: Previous bilateral SFA intervention in 2014 in 2015 and most recently right common femoral artery endarterectomy in 2022 with resolution of claudication.  He has bilateral numbness likely related to his known history of peripheral neuropathy.  I requested a follow-up ABI and lower extremity arterial duplex to be done in 2 months from now.  2. Peripheral diabetic neuropathy: Currently on Lyrica with improvement in symptoms.  3. Coronary artery disease involving native coronary arteries with stable angina: Chronic atypical chest pain.  His symptoms are overall stable.  If lung resection is needed, he does not require ischemic cardiac evaluation.  4. Essential hypertension: Blood pressure is controlled on current medications.  5. Hyperlipidemia: I reviewed most recent lipid profile done in April of last year which showed an LDL of 51.  This is at target and thus we will continue atorvastatin and ezetimibe.   Disposition:   FU with me in 6 months.  Signed,  Kathlyn Sacramento, MD  01/20/2022 9:56 AM    [place]

## 2022-01-20 NOTE — Telephone Encounter (Signed)
I called the patient and he reports he forgot about his appointment on 01/20/22. He has made a follow up for 01/23/22.  ?

## 2022-01-20 NOTE — Patient Instructions (Addendum)
Medication Instructions:  ?The current medical regimen is effective;  continue present plan and medications. ? ?*If you need a refill on your cardiac medications before your next appointment, please call your pharmacy* ? ? ?Testing/Procedures: ?Your physician has requested that you have an ankle brachial index (ABI) (2 months) . During this test an ultrasound and blood pressure cuff are used to evaluate the arteries that supply the arms and legs with blood. Allow thirty minutes for this exam. There are no restrictions or special instructions. ? ?Your physician has requested that you have a lower extremity arterial exercise duplex (2 months). During this test, exercise and ultrasound are used to evaluate arterial blood flow in the legs. Allow one hour for this exam. There are no restrictions or special instructions. ? ? ?Follow-Up: ?At [facility], you and your health needs are our priority.  As part of our continuing mission to provide you with exceptional heart care, we have created designated Provider Care Teams.  These Care Teams include your primary Cardiologist (physician) and Advanced Practice Providers (APPs -  Physician Assistants and Nurse Practitioners) who all work together to provide you with the care you need, when you need it. ? ?We recommend signing up for the patient portal called "MyChart".  Sign up information is provided on this After Visit Summary.  MyChart is used to connect with patients for Virtual Visits (Telemedicine).  Patients are able to view lab/test results, encounter notes, upcoming appointments, etc.  Non-urgent messages can be sent to your provider as well.   ?To learn more about what you can do with MyChart, go to [email or web address].   ? ?Your next appointment:   ?6 month(s) ? ?The format for your next appointment:   ?In Person ? ?Provider:   ?Kathlyn Sacramento, MD  ? ? ? ? ?

## 2022-01-20 NOTE — Progress Notes (Unsigned)
History of Present Illness Steven Hale. is a 66 y.o. male former smoker ( Quit 2020 with an 30 +  pack year smoking history)  past medical history of coronary disease, type 2 diabetes, hypertension, hyperlipidemia, pancreatic cancer in 2009, sleep apnea.Patient had a lung cancer screening CT which revealed the right middle lobe well-circumscribed nodule was present on 05/03/2020, measuring 20 x 16 mm (average diameter 18 mm). On the current exam from 11/04/2021 this nodule measures 23 x 19 mm (average diameter 21 mm.  Unable to see the images tas  they are present in epic care everywhere from the [facility] administration.  This lung cancer screening CT report was read as a lung RADS 4B and completed on 11/04/2021.  He was referred to Dr. Valeta Harms to discuss evaluation of lung nodule consideration for biopsy. He opted for biopsy via Flexible video fiberoptic bronchoscopy with robotic assistance .which was done 01/13/2022. He is here for follow up.    01/20/2022 Pt. Presents for follow up. He states he has been doing well since the biopsy.  Needs MRI brain Refer to surgery vs medical oncology vs medical oncology  Test Results: Cytology 01/13/2022 FINAL MICROSCOPIC DIAGNOSIS:   A. LUNG, RML, FINE NEEDLE ASPIRATION:  - Well-differentiated neuroendocrine (typical carcinoid) tumor   B. LUNG, RML, BRUSHING:  - Well-differentiated neuroendocrine (typical carcinoid) tumor.  See  comment   CBC Latest Ref Rng & Units 01/13/2022 03/09/2021 03/04/2021  WBC 4.0 - 10.5 K/uL 4.1 6.3 10.6(H)  Hemoglobin 13.0 - 17.0 g/dL 12.0(L) 12.2(L) 10.4(L)  Hematocrit 39.0 - 52.0 % 37.8(L) 34.2(L) 31.8(L)  Platelets 150 - 400 K/uL 318 398 326    BMP Latest Ref Rng & Units 01/13/2022 03/09/2021 03/04/2021  Glucose 70 - 99 mg/dL 151(H) 193(H) 128(H)  BUN 8 - 23 mg/dL 6(L) 13 12  Creatinine 0.61 - 1.24 mg/dL 0.94 0.93 0.96  BUN/Creat Ratio 10 - 24 - - -  Sodium 135 - 145 mmol/L 139 135 138  Potassium 3.5 - 5.1 mmol/L  4.2 4.4 4.3  Chloride 98 - 111 mmol/L 105 100 102  CO2 22 - 32 mmol/L 25 24 29   Calcium 8.9 - 10.3 mg/dL 9.5 9.5 9.0    BNP No results found for: BNP  ProBNP    Component Value Date/Time   PROBNP 69.0 01/07/2010 0230    PFT No results found for: FEV1PRE, FEV1POST, FVCPRE, FVCPOST, TLC, DLCOUNC, PREFEV1FVCRT, PSTFEV1FVCRT  NM PET Image Initial (PI) Skull Base To Thigh  Result Date: 01/06/2022 CLINICAL DATA:  Subsequent treatment strategy for pulmonary nodule with history of pancreatic and colon carcinoma. EXAM: NUCLEAR MEDICINE PET SKULL BASE TO THIGH TECHNIQUE: 11.63 mCi F-18 FDG was injected intravenously. Full-ring PET imaging was performed from the skull base to thigh after the radiotracer. CT data was obtained and used for attenuation correction and anatomic localization. Fasting blood glucose: 139 mg/dl COMPARISON:  Multiple prior studies, most recent comparison imaging from December 31, 2020. FINDINGS: Mediastinal blood pool activity: SUV max 2.69 Liver activity: SUV max NA NECK: No hypermetabolic lymph nodes in the neck. Incidental CT findings: none CHEST: Ovoid well-circumscribed nodule in the RIGHT middle lobe just above the RIGHT hemidiaphragm on today's study measuring 2.3 cm not substantially changed from very recent comparison imaging shows a maximum SUV of 2.81 previously with a maximum SUV of 1.7. No additional areas of concern in the chest. Incidental CT findings: Calcified atheromatous plaque of the thoracic aorta. Signs of median sternotomy for coronary revascularization as before.  Normal caliber central pulmonary vessels. Limited assessment of cardiovascular structures given lack of intravenous contrast. Basilar atelectasis without effusion or consolidative changes. Airways are patent. ABDOMEN/PELVIS: No abnormal hypermetabolic activity within the liver, pancreas, adrenal glands, or spleen. No hypermetabolic lymph nodes in the abdomen or pelvis. Incidental CT findings:  Cholelithiasis. Post distal pancreatectomy and splenectomy. Nodule arising from or adjacent to the LEFT adrenal measuring 2.1 cm displays no change compared to studies dating back to June of 2021 and does not display evidence of increased metabolic activity. No perivesical stranding. No perinephric stranding or hydronephrosis. No acute gastrointestinal findings. Normal appendix. SKELETON: No focal hypermetabolic activity to suggest skeletal metastasis. Incidental CT findings: none IMPRESSION: Continued enlargement of a RIGHT middle lobe pulmonary nodule with only mildly increased metabolic activity only slightly greater than mediastinal blood pool. Findings are suspicious for indolent neoplasm with slight increase in metabolic activity relative to mediastinal blood pool and with continued increase in size. The correlate with prior pathology determine whether mucinous neoplasm was found at primary pathology particularly of colon cancer at this can sometimes show low level uptake. Stable appearance of nodularity about the LEFT adrenal gland and without signs of increased metabolic activity in this area. Continued attention on follow-up is suggested. To differentiate from splenic tissue/splenosis could consider heat damaged red blood cell study as warranted. Post distal pancreatectomy and splenectomy. Cholelithiasis. Aortic atherosclerosis and signs of coronary artery disease post coronary revascularization. Electronically Signed   By: Zetta Bills M.D.   On: 01/06/2022 14:25   DG Chest Port 1 View  Result Date: 01/13/2022 CLINICAL DATA:  Status post bronchoscopy. EXAM: PORTABLE CHEST 1 VIEW COMPARISON:  CT chest dated December 31, 2021. Chest x-ray dated March 09, 2021. FINDINGS: Stable cardiomediastinal silhouette status post CABG with normal heart size. Normal pulmonary vascularity. Unchanged 2.2 cm nodule at the right lung base. Mild right basilar atelectasis. Minimal linear atelectasis in the left mid lung.  No focal consolidation, pleural effusion, or pneumothorax. No acute osseous abnormality. IMPRESSION: 1. Unchanged 2.2 cm nodule in the right lung base. No pneumothorax post bronchoscopy. Electronically Signed   By: Titus Dubin M.D.   On: 01/13/2022 10:32   CT Super D Chest Wo Contrast  Result Date: 01/02/2022 CLINICAL DATA:  Follow-up right middle lobe lung nodule. EXAM: CT CHEST WITHOUT CONTRAST TECHNIQUE: Multidetector CT imaging of the chest was performed using thin slice collimation for electromagnetic bronchoscopy planning purposes, without intravenous contrast. RADIATION DOSE REDUCTION: This exam was performed according to the departmental dose-optimization program which includes automated exposure control, adjustment of the mA and/or kV according to patient size and/or use of iterative reconstruction technique. COMPARISON:  PET-CT 05/29/2020 and CT chest 05/03/2020 FINDINGS: Cardiovascular: Heart size appears normal aortic atherosclerosis. Previous median sternotomy and CABG procedure. No pericardial effusion identified. Mediastinum/Nodes: Low right paratracheal lymph node has a short axis of 1.6 cm and is unchanged from 05/03/2020, image 58/2. Thyroid gland, trachea, and esophagus demonstrate no significant findings. Lungs/Pleura: There is no pleural effusion, airspace consolidation or pneumothorax. Mild changes of emphysema. Right middle lobe lung nodule is again noted. This measures 2.2 x 1.7 by 1.7 cm (volume = 3.3 cm^3), image 90/7. On 05/03/2020 this was measured at 2.0 x 1.6 by 1.6 cm (volume = 2.7 cm^3). On remote study from 05/10/2009 this nodule measured 5 mm. Stable tiny peripheral nodule within the right upper lobe measuring 3 mm, image 65/7. Upper Abdomen: No acute findings. 5 mm low-density structure within lateral aspect of left lobe of  liver is technically too small to reliably characterize, but is unchanged from 04/19/2020, image 121/2. left adrenal nodule measures 2 cm and is stable  from previous exam most likely representing a benign adrenal adenoma. Aortic atherosclerotic calcifications noted. Musculoskeletal: No chest wall mass or suspicious bone lesions identified.Thoracic degenerative disc disease noted. IMPRESSION: 1. There has been mild increase in size (on the order of 1-2 mm in each dimension) of the right middle lobe lung nodule which now has a maximum dimension of 2.2 cm. Of note: This nodule was present on remote study from 05/10/2009 measuring 5 mm. Low-grade indolent pulmonary neoplasm cannot be excluded. 2. Stable enlarged low right paratracheal lymph node compared with 05/03/2020. 3. Left adrenal adenoma. 4. Aortic Atherosclerosis (ICD10-I70.0) and Emphysema (ICD10-J43.9). Electronically Signed   By: Kerby Moors M.D.   On: 01/02/2022 07:45   DG C-ARM BRONCHOSCOPY  Result Date: 01/13/2022 C-ARM BRONCHOSCOPY: Fluoroscopy was utilized by the requesting physician.  No radiographic interpretation.     Past medical hx Past Medical History:  Diagnosis Date   Anemia, unspecified    Bell's palsy    resolved - no deficits   Blood transfusion    during treatment for Ca   Blood transfusion without reported diagnosis    CAD (coronary artery disease)    a. s/p multiple PCIs;  b. s/p CABG in 09/2010 (LIMA-LAD, SVG-OM1, SVG-distal RCA/OM2);  Cardiac cath in 12/2015: Mild LAD disease, occluded mid LCX and proximal RCA (left to right collaterals), Occluded SVG to RCA and atretic LIMA. Patent SVG to OM   Chronic back pain    Diabetes mellitus without complication (HCC)    type 2   DJD (degenerative joint disease)    low back & all over    GERD (gastroesophageal reflux disease)    Helicobacter pylori (H. pylori) infection    Hemorrhoids    History of shingles    Hyperlipidemia    Hypertension    Impotence of organic origin    Myocardial infarction ([place])    2005 and 2012    PAD (peripheral artery disease) ([place])    a. s/p bilat SFA stents;  b. ABIs 11/2012: R 0.99,  L 0.86.   Pancreatic cancer [facility])    surgery 2009 cyst removed   Pituitary adenoma [facility])    surgery at [facility] Feb 2020   Sleep apnea 06/05/2019   does not use cpap   Substance abuse (HCC)    drug and alcohol abuse     Social History   Tobacco Use   Smoking status: Former    Packs/day: 1.00    Years: 18.00    Pack years: 18.00    Types: Cigarettes    Quit date: 06/2019    Years since quitting: 2.5   Smokeless tobacco: Never  Vaping Use   Vaping Use: Never used  Substance Use Topics   Alcohol use: Yes    Comment: occasional   Drug use: No    Mr.Thivierge reports that he quit smoking about 2 years ago. His smoking use included cigarettes. He has a 18.00 pack-year smoking history. He has never used smokeless tobacco. He reports current alcohol use. He reports that he does not use drugs.  Tobacco Cessation: Counseling given: Not Answered   Past surgical hx, Family hx, Social hx all reviewed.  Current Outpatient Medications on File Prior to Visit  Medication Sig   amoxicillin (AMOXIL) 500 MG capsule TAKE 4 CAPSULES BY MOUTH 1 HOUR PRIOR TO DENTAL APPOINTMENT   aspirin  EC 81 MG tablet Take 1 tablet (81 mg total) by mouth daily.   atorvastatin (LIPITOR) 40 MG tablet Take 1 tablet (40 mg total) by mouth daily. (Patient taking differently: Take 40 mg by mouth at bedtime.)   DULoxetine HCl 30 MG CSDR Take 30 mg by mouth 2 (two) times daily.   ezetimibe (ZETIA) 10 MG tablet Take 1 tablet by mouth daily. Patient must schedule annual appointment for future refills   fish oil-omega-3 fatty acids 1000 MG capsule Take 1 capsule (1 g total) by mouth daily.   levothyroxine (SYNTHROID) 25 MCG tablet Take 25 mcg by mouth daily.   lisinopril (ZESTRIL) 2.5 MG tablet Take 2.5 mg by mouth daily.   metFORMIN (GLUCOPHAGE) 500 MG tablet Take 1 tablet (500 mg total) by mouth daily with breakfast. (Patient taking differently: Take 500 mg by mouth 2 (two) times daily with a meal.)   metoprolol tartrate  (LOPRESSOR) 25 MG tablet Take 1 tablet by mouth two times daily. PATIENT NEEDS TO SCHEDULE AND OFFICE VISIT FOR FUTURE REFILLS.   Multiple Vitamin (MULTIVITAMIN) tablet Take 1 tablet by mouth daily.   naproxen (NAPROSYN) 500 MG tablet Take 1 tablet (500 mg total) by mouth 2 (two) times daily.   nitroGLYCERIN (NITROSTAT) 0.4 MG SL tablet Place 0.4 mg under the tongue every 2 (two) hours as needed for chest pain.   pantoprazole (PROTONIX) 40 MG tablet Take 1 tablet (40 mg total) by mouth daily.   pregabalin (LYRICA) 150 MG capsule Take 1 capsule (150 mg total) by mouth daily. (Patient taking differently: Take 300 mg by mouth 2 (two) times daily.)   Propylene Glycol (SYSTANE BALANCE) 0.6 % SOLN Apply 1 drop to eye 4 (four) times daily.   sildenafil (VIAGRA) 100 MG tablet Take 0.5-1 tablets (50-100 mg total) by mouth daily as needed for erectile dysfunction.   No current facility-administered medications on file prior to visit.     No Known Allergies  Review Of Systems:  Constitutional:   No  weight loss, night sweats,  Fevers, chills, fatigue, or  lassitude.  HEENT:   No headaches,  Difficulty swallowing,  Tooth/dental problems, or  Sore throat,                No sneezing, itching, ear ache, nasal congestion, post nasal drip,   CV:  No chest pain,  Orthopnea, PND, swelling in lower extremities, anasarca, dizziness, palpitations, syncope.   GI  No heartburn, indigestion, abdominal pain, nausea, vomiting, diarrhea, change in bowel habits, loss of appetite, bloody stools.   Resp: No shortness of breath with exertion or at rest.  No excess mucus, no productive cough,  No non-productive cough,  No coughing up of blood.  No change in color of mucus.  No wheezing.  No chest wall deformity  Skin: no rash or lesions.  GU: no dysuria, change in color of urine, no urgency or frequency.  No flank pain, no hematuria   MS:  No joint pain or swelling.  No decreased range of motion.  No back  pain.  Psych:  No change in mood or affect. No depression or anxiety.  No memory loss.   Vital Signs There were no vitals taken for this visit.   Physical Exam:  General- No distress,  A&Ox3 ENT: No sinus tenderness, TM clear, pale nasal mucosa, no oral exudate,no post nasal drip, no LAN Cardiac: S1, S2, regular rate and rhythm, no murmur Chest: No wheeze/ rales/ dullness; no accessory muscle use, no  nasal flaring, no sternal retractions Abd.: Soft Non-tender Ext: No clubbing cyanosis, edema Neuro:  normal strength Skin: No rashes, warm and dry Psych: normal mood and behavior   Assessment/Plan  No problem-specific Assessment & Plan notes found for this encounter.    Magdalen Spatz, NP 01/20/2022  11:54 AM

## 2022-01-20 NOTE — Telephone Encounter (Signed)
Steven Hale, this patient must be re-scheduled for follow up. He has a new lung cancer diagnosis that needs to be addressed. He needs follow up with me or [facility] asap for discussion and referral to Dr. Kipp Brood. Please schedule with me or Icard asap. Thanks so much ?

## 2022-01-22 ENCOUNTER — Other Ambulatory Visit (HOSPITAL_COMMUNITY): Payer: Self-pay

## 2022-01-23 ENCOUNTER — Other Ambulatory Visit: Payer: Self-pay

## 2022-01-23 ENCOUNTER — Encounter: Payer: Self-pay | Admitting: Acute Care

## 2022-01-23 ENCOUNTER — Ambulatory Visit (INDEPENDENT_AMBULATORY_CARE_PROVIDER_SITE_OTHER): Payer: No Typology Code available for payment source | Admitting: Acute Care

## 2022-01-23 VITALS — BP 110/60 | HR 72 | Temp 97.6°F | Ht 69.0 in | Wt 238.8 lb

## 2022-01-23 DIAGNOSIS — Z87891 Personal history of nicotine dependence: Secondary | ICD-10-CM

## 2022-01-23 DIAGNOSIS — D3A8 Other benign neuroendocrine tumors: Secondary | ICD-10-CM

## 2022-01-23 DIAGNOSIS — R918 Other nonspecific abnormal finding of lung field: Secondary | ICD-10-CM | POA: Diagnosis not present

## 2022-01-23 NOTE — Patient Instructions (Addendum)
It is good to see you today. ?You have an appointment with Dr. Kipp Brood 01/30/2022. ?He will discuss options for treating your tumor, and make additional referrals as needed. ?I will order Pulmonary Function tests to assess your lung function.  ?We will try to get these scheduled today.  ?Follow up with Dr. Valeta Harms in 6 months . ?Call anytime if you need [place] sooner.  ?Please contact office for sooner follow up if symptoms do not improve or worsen or seek emergency care   ? ?

## 2022-01-23 NOTE — Progress Notes (Signed)
History of Present Illness Steven Hale. is a Former smoker ( Quit 2009 with an 18 pack year smoking history)   66 y.o. male with  past medical history of coronary disease, type 2 diabetes, hypertension, hyperlipidemia, pancreatic cancer in 2009, sleep apnea.Patient had a lung cancer screening CT which revealed the right middle lobe well-circumscribed nodule was present on 05/03/2020, measuring 20 x 16 mm (average diameter 18 mm). On the current exam from 11/04/2021 this nodule measures 23 x 19 mm (average diameter 21 mm.  Unable to see the images today however they are present in epic care everywhere from the [facility] administration.  This lung cancer screening CT report was read as a lung RADS 4B and completed on 11/04/2021.  Pt. Had Flexible video fiberoptic bronchoscopy with robotic assistance and biopsies on 01/13/2022. He is here for follow up.    01/23/2022 Pt. Presents for follow up. He states he has done well after the bronchoscopy. He did  have  bleeding  for a few days after the bronch, but this has cleared up.  He denies any sore throat. We discussed that the biopsy of the lung nodule was + for a Well-differentiated neuroendocrine (typical carcinoid) tumor , and that best option for treatment was most likely surgery. He has an appointment with Dr. Kipp Brood 01/30/2022 to be evaluated. I have ordered PFT's as he most likely will need these to be evaluated for surgery. He does not wear oxygen or use inhalers, so he should be a good surgical candidate.  He has verbalized understanding of the diagnosis and treatment plan. 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   Test Results: FINAL MICROSCOPIC DIAGNOSIS:  01/13/2022  A. LUNG, RML, FINE NEEDLE ASPIRATION:  - Well-differentiated neuroendocrine (typical carcinoid) tumor   B. LUNG, RML, BRUSHING:  - Well-differentiated neuroendocrine (typical carcinoid) tumor.  See  comment    PET scan 01/05/2022 Continued enlargement of a  RIGHT middle lobe pulmonary nodule with only mildly increased metabolic activity only slightly greater than mediastinal blood pool. Findings are suspicious for indolent neoplasm with slight increase in metabolic activity relative to mediastinal blood pool and with continued increase in size. The correlate with prior pathology determine whether mucinous neoplasm was found at primary pathology particularly of colon cancer at this can sometimes show low level uptake.   Stable appearance of nodularity about the LEFT adrenal gland and without signs of increased metabolic activity in this area. Continued attention on follow-up is suggested. To differentiate from splenic tissue/splenosis could consider heat damaged red blood cell study as warranted.    CBC Latest Ref Rng & Units 01/13/2022 03/09/2021 03/04/2021  WBC 4.0 - 10.5 K/uL 4.1 6.3 10.6(H)  Hemoglobin 13.0 - 17.0 g/dL 12.0(L) 12.2(L) 10.4(L)  Hematocrit 39.0 - 52.0 % 37.8(L) 34.2(L) 31.8(L)  Platelets 150 - 400 K/uL 318 398 326    BMP Latest Ref Rng & Units 01/13/2022 03/09/2021 03/04/2021  Glucose 70 - 99 mg/dL 151(H) 193(H) 128(H)  BUN 8 - 23 mg/dL 6(L) 13 12  Creatinine 0.61 - 1.24 mg/dL 0.94 0.93 0.96  BUN/Creat Ratio 10 - 24 - - -  Sodium 135 - 145 mmol/L 139 135 138  Potassium 3.5 - 5.1 mmol/L 4.2 4.4 4.3  Chloride 98 - 111 mmol/L 105 100 102  CO2 22 - 32 mmol/L 25 24 29   Calcium 8.9 - 10.3 mg/dL 9.5 9.5 9.0    BNP No results found for: BNP  ProBNP    Component  Value Date/Time   PROBNP 69.0 01/07/2010 0230    PFT No results found for: FEV1PRE, FEV1POST, FVCPRE, FVCPOST, TLC, DLCOUNC, PREFEV1FVCRT, PSTFEV1FVCRT  NM PET Image Initial (PI) Skull Base To Thigh  Result Date: 01/06/2022 CLINICAL DATA:  Subsequent treatment strategy for pulmonary nodule with history of pancreatic and colon carcinoma. EXAM: NUCLEAR MEDICINE PET SKULL BASE TO THIGH TECHNIQUE: 11.63 mCi F-18 FDG was injected intravenously. Full-ring PET  imaging was performed from the skull base to thigh after the radiotracer. CT data was obtained and used for attenuation correction and anatomic localization. Fasting blood glucose: 139 mg/dl COMPARISON:  Multiple prior studies, most recent comparison imaging from December 31, 2020. FINDINGS: Mediastinal blood pool activity: SUV max 2.69 Liver activity: SUV max NA NECK: No hypermetabolic lymph nodes in the neck. Incidental CT findings: none CHEST: Ovoid well-circumscribed nodule in the RIGHT middle lobe just above the RIGHT hemidiaphragm on today's study measuring 2.3 cm not substantially changed from very recent comparison imaging shows a maximum SUV of 2.81 previously with a maximum SUV of 1.7. No additional areas of concern in the chest. Incidental CT findings: Calcified atheromatous plaque of the thoracic aorta. Signs of median sternotomy for coronary revascularization as before. Normal caliber central pulmonary vessels. Limited assessment of cardiovascular structures given lack of intravenous contrast. Basilar atelectasis without effusion or consolidative changes. Airways are patent. ABDOMEN/PELVIS: No abnormal hypermetabolic activity within the liver, pancreas, adrenal glands, or spleen. No hypermetabolic lymph nodes in the abdomen or pelvis. Incidental CT findings: Cholelithiasis. Post distal pancreatectomy and splenectomy. Nodule arising from or adjacent to the LEFT adrenal measuring 2.1 cm displays no change compared to studies dating back to June of 2021 and does not display evidence of increased metabolic activity. No perivesical stranding. No perinephric stranding or hydronephrosis. No acute gastrointestinal findings. Normal appendix. SKELETON: No focal hypermetabolic activity to suggest skeletal metastasis. Incidental CT findings: none IMPRESSION: Continued enlargement of a RIGHT middle lobe pulmonary nodule with only mildly increased metabolic activity only slightly greater than mediastinal blood pool.  Findings are suspicious for indolent neoplasm with slight increase in metabolic activity relative to mediastinal blood pool and with continued increase in size. The correlate with prior pathology determine whether mucinous neoplasm was found at primary pathology particularly of colon cancer at this can sometimes show low level uptake. Stable appearance of nodularity about the LEFT adrenal gland and without signs of increased metabolic activity in this area. Continued attention on follow-up is suggested. To differentiate from splenic tissue/splenosis could consider heat damaged red blood cell study as warranted. Post distal pancreatectomy and splenectomy. Cholelithiasis. Aortic atherosclerosis and signs of coronary artery disease post coronary revascularization. Electronically Signed   By: Zetta Bills M.D.   On: 01/06/2022 14:25   DG Chest Port 1 View  Result Date: 01/13/2022 CLINICAL DATA:  Status post bronchoscopy. EXAM: PORTABLE CHEST 1 VIEW COMPARISON:  CT chest dated December 31, 2021. Chest x-ray dated March 09, 2021. FINDINGS: Stable cardiomediastinal silhouette status post CABG with normal heart size. Normal pulmonary vascularity. Unchanged 2.2 cm nodule at the right lung base. Mild right basilar atelectasis. Minimal linear atelectasis in the left mid lung. No focal consolidation, pleural effusion, or pneumothorax. No acute osseous abnormality. IMPRESSION: 1. Unchanged 2.2 cm nodule in the right lung base. No pneumothorax post bronchoscopy. Electronically Signed   By: Titus Dubin M.D.   On: 01/13/2022 10:32   CT Super D Chest Wo Contrast  Result Date: 01/02/2022 CLINICAL DATA:  Follow-up right middle lobe lung nodule.  EXAM: CT CHEST WITHOUT CONTRAST TECHNIQUE: Multidetector CT imaging of the chest was performed using thin slice collimation for electromagnetic bronchoscopy planning purposes, without intravenous contrast. RADIATION DOSE REDUCTION: This exam was performed according to the  departmental dose-optimization program which includes automated exposure control, adjustment of the mA and/or kV according to patient size and/or use of iterative reconstruction technique. COMPARISON:  PET-CT 05/29/2020 and CT chest 05/03/2020 FINDINGS: Cardiovascular: Heart size appears normal aortic atherosclerosis. Previous median sternotomy and CABG procedure. No pericardial effusion identified. Mediastinum/Nodes: Low right paratracheal lymph node has a short axis of 1.6 cm and is unchanged from 05/03/2020, image 58/2. Thyroid gland, trachea, and esophagus demonstrate no significant findings. Lungs/Pleura: There is no pleural effusion, airspace consolidation or pneumothorax. Mild changes of emphysema. Right middle lobe lung nodule is again noted. This measures 2.2 x 1.7 by 1.7 cm (volume = 3.3 cm^3), image 90/7. On 05/03/2020 this was measured at 2.0 x 1.6 by 1.6 cm (volume = 2.7 cm^3). On remote study from 05/10/2009 this nodule measured 5 mm. Stable tiny peripheral nodule within the right upper lobe measuring 3 mm, image 65/7. Upper Abdomen: No acute findings. 5 mm low-density structure within lateral aspect of left lobe of liver is technically too small to reliably characterize, but is unchanged from 04/19/2020, image 121/2. left adrenal nodule measures 2 cm and is stable from previous exam most likely representing a benign adrenal adenoma. Aortic atherosclerotic calcifications noted. Musculoskeletal: No chest wall mass or suspicious bone lesions identified.Thoracic degenerative disc disease noted. IMPRESSION: 1. There has been mild increase in size (on the order of 1-2 mm in each dimension) of the right middle lobe lung nodule which now has a maximum dimension of 2.2 cm. Of note: This nodule was present on remote study from 05/10/2009 measuring 5 mm. Low-grade indolent pulmonary neoplasm cannot be excluded. 2. Stable enlarged low right paratracheal lymph node compared with 05/03/2020. 3. Left adrenal  adenoma. 4. Aortic Atherosclerosis (ICD10-I70.0) and Emphysema (ICD10-J43.9). Electronically Signed   By: Kerby Moors M.D.   On: 01/02/2022 07:45   DG C-ARM BRONCHOSCOPY  Result Date: 01/13/2022 C-ARM BRONCHOSCOPY: Fluoroscopy was utilized by the requesting physician.  No radiographic interpretation.     Past medical hx Past Medical History:  Diagnosis Date   Anemia, unspecified    Bell's palsy    resolved - no deficits   Blood transfusion    during treatment for Ca   Blood transfusion without reported diagnosis    CAD (coronary artery disease)    a. s/p multiple PCIs;  b. s/p CABG in 09/2010 (LIMA-LAD, SVG-OM1, SVG-distal RCA/OM2);  Cardiac cath in 12/2015: Mild LAD disease, occluded mid LCX and proximal RCA (left to right collaterals), Occluded SVG to RCA and atretic LIMA. Patent SVG to OM   Chronic back pain    Diabetes mellitus without complication (HCC)    type 2   DJD (degenerative joint disease)    low back & all over    GERD (gastroesophageal reflux disease)    Helicobacter pylori (H. pylori) infection    Hemorrhoids    History of shingles    Hyperlipidemia    Hypertension    Impotence of organic origin    Myocardial infarction ([place])    2005 and 2012    PAD (peripheral artery disease) ([place])    a. s/p bilat SFA stents;  b. ABIs 11/2012: R 0.99, L 0.86.   Pancreatic cancer [facility])    surgery 2009 cyst removed   Pituitary adenoma ([place])  surgery at [facility] Feb 2020   Sleep apnea 06/05/2019   does not use cpap   Substance abuse (HCC)    drug and alcohol abuse     Social History   Tobacco Use   Smoking status: Former    Packs/day: 1.00    Years: 18.00    Pack years: 18.00    Types: Cigarettes    Quit date: 12/17/2007    Years since quitting: 14.1   Smokeless tobacco: Never  Vaping Use   Vaping Use: Never used  Substance Use Topics   Alcohol use: Yes    Comment: occasional   Drug use: No    Mr.Corter reports that he quit smoking about 14 years ago. His  smoking use included cigarettes. He has a 18.00 pack-year smoking history. He has never used smokeless tobacco. He reports current alcohol use. He reports that he does not use drugs.  Tobacco Cessation: Counseling given: Not Answered   Past surgical hx, Family hx, Social hx all reviewed.  Current Outpatient Medications on File Prior to Visit  Medication Sig   aspirin EC 81 MG tablet Take 1 tablet (81 mg total) by mouth daily.   atorvastatin (LIPITOR) 40 MG tablet Take 1 tablet (40 mg total) by mouth daily. (Patient taking differently: Take 40 mg by mouth at bedtime.)   DULoxetine HCl 30 MG CSDR Take 30 mg by mouth 2 (two) times daily.   ezetimibe (ZETIA) 10 MG tablet Take 1 tablet by mouth daily. Patient must schedule annual appointment for future refills   fish oil-omega-3 fatty acids 1000 MG capsule Take 1 capsule (1 g total) by mouth daily.   levothyroxine (SYNTHROID) 25 MCG tablet Take 25 mcg by mouth daily.   lisinopril (ZESTRIL) 2.5 MG tablet Take 2.5 mg by mouth daily.   metFORMIN (GLUCOPHAGE) 500 MG tablet Take 1 tablet (500 mg total) by mouth daily with breakfast. (Patient taking differently: Take 500 mg by mouth 2 (two) times daily with a meal.)   metoprolol tartrate (LOPRESSOR) 25 MG tablet Take 1 tablet by mouth two times daily. PATIENT NEEDS TO SCHEDULE AND OFFICE VISIT FOR FUTURE REFILLS.   Multiple Vitamin (MULTIVITAMIN) tablet Take 1 tablet by mouth daily.   naproxen (NAPROSYN) 500 MG tablet Take 1 tablet (500 mg total) by mouth 2 (two) times daily.   nitroGLYCERIN (NITROSTAT) 0.4 MG SL tablet Place 0.4 mg under the tongue every 2 (two) hours as needed for chest pain.   pantoprazole (PROTONIX) 40 MG tablet Take 1 tablet (40 mg total) by mouth daily.   pregabalin (LYRICA) 150 MG capsule Take 1 capsule (150 mg total) by mouth daily. (Patient taking differently: Take 300 mg by mouth 2 (two) times daily.)   Propylene Glycol (SYSTANE BALANCE) 0.6 % SOLN Apply 1 drop to eye 4 (four)  times daily.   sildenafil (VIAGRA) 100 MG tablet Take 0.5-1 tablets (50-100 mg total) by mouth daily as needed for erectile dysfunction.   amoxicillin (AMOXIL) 500 MG capsule TAKE 4 CAPSULES BY MOUTH 1 HOUR PRIOR TO DENTAL APPOINTMENT (Patient not taking: Reported on 01/23/2022)   No current facility-administered medications on file prior to visit.     No Known Allergies  Review Of Systems:  Constitutional:   No  weight loss, night sweats,  Fevers, chills, fatigue, or  lassitude.  HEENT:   No headaches,  Difficulty swallowing,  Tooth/dental problems, or  Sore throat,                No  sneezing, itching, ear ache, nasal congestion, post nasal drip,   CV:  No chest pain,  Orthopnea, PND, swelling in lower extremities, anasarca, dizziness, palpitations, syncope.   GI  No heartburn, indigestion, abdominal pain, nausea, vomiting, diarrhea, change in bowel habits, loss of appetite, bloody stools.   Resp: No shortness of breath with exertion or at rest.  No excess mucus, no productive cough,  No non-productive cough,  No coughing up of blood.  No change in color of mucus.  No wheezing.  No chest wall deformity  Skin: no rash or lesions.  GU: no dysuria, change in color of urine, no urgency or frequency.  No flank pain, no hematuria   MS:  No joint pain or swelling.  No decreased range of motion.  No back pain.  Psych:  No change in mood or affect. No depression or anxiety.  No memory loss.   Vital Signs BP 110/60 (BP Location: Right Arm, Patient Position: Sitting, Cuff Size: Normal)    Pulse 72    Temp 97.6 F (36.4 C) (Oral)    Ht 5\' 9"  (1.753 m)    Wt 238 lb 12.8 oz (108.3 kg)    SpO2 96%    BMI 35.26 kg/m    Physical Exam:  General- No distress,  A&Ox3, pleasant ENT: No sinus tenderness, TM clear, pale nasal mucosa, no oral exudate,no post nasal drip, no LAN Cardiac: S1, S2, regular rate and rhythm, no murmur Chest: No wheeze/ rales/ dullness; no accessory muscle use, no nasal  flaring, no sternal retractions Abd.: Soft Non-tender, ND, BS +, Body mass index is 35.26 kg/m. Ext: No clubbing cyanosis, edema Neuro:  normal strength, MAE x 4, A&O x 3 Skin: No rashes, warm and dry, no lesions  Psych: normal mood and behavior, appropriate   Assessment/Plan Right Middle Lobe Well-differentiated neuroendocrine (typical carcinoid) tumor per biopsy  Former smoker with an 18 year smoking history Plan You have an appointment with Dr. Kipp Brood 01/30/2022. ( Thoracic Surgery) He will discuss options for treating your tumor, and make additional referrals as needed. I will order Pulmonary Function tests to assess your lung function.  We will try to get these scheduled today.  Follow up with Dr. Valeta Harms in 3-6 months . Call anytime if you need [place] sooner.  Please contact office for sooner follow up if symptoms do not improve or worsen or seek emergency care    I spent 40 minutes dedicated to the care of this patient on the date of this encounter to include pre-visit review of records, face-to-face time with the patient discussing conditions above, post visit ordering of testing, clinical documentation with the electronic health record, making appropriate referrals as documented, and communicating necessary information to the patient's healthcare team.    Magdalen Spatz, NP 01/23/2022  11:18 AM

## 2022-01-27 ENCOUNTER — Other Ambulatory Visit (HOSPITAL_COMMUNITY): Payer: Self-pay

## 2022-01-27 MED ORDER — OZEMPIC (0.25 OR 0.5 MG/DOSE) 2 MG/3ML ~~LOC~~ SOPN
PEN_INJECTOR | SUBCUTANEOUS | 0 refills | Status: DC
  Filled 2022-01-27: qty 3, 56d supply, fill #0
  Filled 2022-02-20 – 2022-03-06 (×2): qty 3, 56d supply, fill #1

## 2022-01-28 ENCOUNTER — Other Ambulatory Visit (HOSPITAL_COMMUNITY): Payer: Self-pay

## 2022-01-28 NOTE — Progress Notes (Signed)
? ?   ?[address] ?      [place] [address] ?            [phone]   ?                 ?Steven Hale. ?[place] Record #[id number] ?Date of Birth: [birth year] ? ?Referring: Garner Nash, DO ?Primary Care: Tana Coast, MD ?Primary Cardiologist: Kirk Ruths, MD ? ?Chief Complaint:    ?Chief Complaint  ?Patient presents with  ? Lung Lesion  ?  Surgical consult, PET Scan 01/05/22, Chest CT 12/31/21, Bronch 01/13/22  ? ? ?History of Present Illness:    ?Steven Hale. 66 y.o. 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  He is a former smoker who quit in 2009, but has been followed at the [facility] for a 2 cm pulmonary nodule.  He recently underwent a PET/CT which showed minimal activity as well as a bronchoscopy which confirmed the diagnosis. ? ?In regards to his symptoms, he does endorse some shortness of breath when climbing multiple flights of stairs.  He has undergone coronary revascularization back in 2012.  He denies any neurologic symptoms or weight changes. ? ?  ? ? ? ? ? ?[name]: ?At the time of surgery this patient?s most appropriate activity status/level should be described as: ?[x]     0    Normal activity, no symptoms ?[]     1    Restricted in physical strenuous activity but ambulatory, able to do out light work ?[]     2    Ambulatory and capable of self care, unable to do work activities, up and about               >50 % of waking hours                              ?[]     3    Only limited self care, in bed greater than 50% of waking hours ?[]     4    Completely disabled, no self care, confined to bed or chair ?[]     5    Moribund ? ? ?Past Medical History:  ?Diagnosis Date  ? Anemia, unspecified   ? Bell's palsy   ? resolved - no deficits  ? Blood transfusion   ? during treatment for Ca  ? Blood transfusion without reported diagnosis   ? CAD (coronary artery disease)   ? a. s/p multiple PCIs;  b. s/p CABG in  09/2010 (LIMA-LAD, SVG-OM1, SVG-distal RCA/OM2);  Cardiac cath in 12/2015: Mild LAD disease, occluded mid LCX and proximal RCA (left to right collaterals), Occluded SVG to RCA and atretic LIMA. Patent SVG to OM  ? Chronic back pain   ? Diabetes mellitus without complication ([place])   ? type 2  ? DJD (degenerative joint disease)   ? low back & all over   ? GERD (gastroesophageal reflux disease)   ? Helicobacter pylori (H. pylori) infection   ? Hemorrhoids   ? History of shingles   ? Hyperlipidemia   ? Hypertension   ? Impotence of organic origin   ? Myocardial infarction [facility])   ? 2005 and 2012   ? PAD (peripheral artery disease) ([place])   ? a. s/p bilat SFA stents;  b. ABIs 11/2012: R 0.99, L 0.86.  ? Pancreatic cancer ([place])   ?  surgery 2009 cyst removed  ? Pituitary adenoma ([place])   ? surgery at [facility] Feb 2020  ? Sleep apnea 06/05/2019  ? does not use cpap  ? Substance abuse ([place])   ? drug and alcohol abuse  ? ? ?Past Surgical History:  ?Procedure Laterality Date  ? ABDOMINAL AORTAGRAM N/A 09/13/2013  ? Procedure: ABDOMINAL AORTAGRAM;  Surgeon: Wellington Hampshire, MD;  Location: [facility] CATH LAB;  Service: Cardiovascular;  Laterality: N/A;  ? ABDOMINAL AORTAGRAM N/A 08/29/2014  ? Procedure: ABDOMINAL AORTAGRAM;  Surgeon: Wellington Hampshire, MD;  Location: [facility] CATH LAB;  Service: Cardiovascular;  Laterality: N/A;  ? ABDOMINAL AORTOGRAM W/LOWER EXTREMITY N/A 02/19/2021  ? Procedure: ABDOMINAL AORTOGRAM W/LOWER EXTREMITY;  Surgeon: Wellington Hampshire, MD;  Location: [place] CV LAB;  Service: Cardiovascular;  Laterality: N/A;  ? BACK SURGERY    ? 1992  ? BRONCHIAL BIOPSY  01/13/2022  ? Procedure: BRONCHIAL BIOPSIES;  Surgeon: Garner Nash, DO;  Location: [place] ENDOSCOPY;  Service: Pulmonary;;  ? BRONCHIAL BRUSHINGS  01/13/2022  ? Procedure: BRONCHIAL BRUSHINGS;  Surgeon: Garner Nash, DO;  Location: [place];  Service: Pulmonary;;  ? BRONCHIAL NEEDLE ASPIRATION BIOPSY  01/13/2022  ? Procedure: BRONCHIAL NEEDLE ASPIRATION  BIOPSIES;  Surgeon: Garner Nash, DO;  Location: [place];  Service: Pulmonary;;  ? CARDIAC CATHETERIZATION N/A 12/25/2015  ? Procedure: Left Heart Cath and Cors/Grafts Angiography;  Surgeon: Wellington Hampshire, MD;  Location: [place] CV LAB;  Service: Cardiovascular;  Laterality: N/A;  ? COLONOSCOPY    ? CORONARY ARTERY BYPASS GRAFT  nov 2011  ? x 4  ? ENDARTERECTOMY FEMORAL Right 03/03/2021  ? Procedure: RIGHT FEMORAL ENDARTERECTOMY;  Surgeon: Cherre Robins, MD;  Location[facility] OR;  Service: Vascular;  Laterality: Right;  ? ESOPHAGOGASTRODUODENOSCOPY (EGD) WITH PROPOFOL N/A 04/02/2017  ? Procedure: ESOPHAGOGASTRODUODENOSCOPY (EGD) WITH PROPOFOL;  Surgeon: Carol Ada, MD;  [facility] ENDOSCOPY;  Service: Endoscopy;  Laterality: N/A;  ? HEMORRHOID SURGERY  10/30/2011  ? Procedure: HEMORRHOIDECTOMY;  Surgeon: Harl Bowie, MD;  Location: [place];  Service: General;  Laterality: N/A;  ? JOINT REPLACEMENT    ? pancreatic  cancer  05/10/2008  ? Resection of distal panrease and spleen  ? PARTIAL KNEE ARTHROPLASTY Right 11/19/2014  ? Procedure: RIGHT UNI-KNEE ARTHROPLASTY MEDIALLY;  Surgeon: Mauri Pole, MD;  Location: [facility];  Service: Orthopedics;  Laterality: Right;  ? PATCH ANGIOPLASTY Right 03/03/2021  ? Procedure: PATCH ANGIOPLASTY;  Surgeon: Cherre Robins, MD;  Location: [place];  Service: Vascular;  Laterality: Right;  ? PERIPHERAL VASCULAR CATHETERIZATION  Oct 2014  ? Lt SFA PTA  ? PERIPHERAL VASCULAR CATHETERIZATION  Oct 2015  ? ISR Lt SFA-PTA  ? right partial knee replacement    ? splenectomy    ? THROMBECTOMY FEMORAL ARTERY Left 09/13/2013  ? Procedure: THROMBECTOMY FEMORAL ARTERY;  Surgeon: Wellington Hampshire, MD;  Location: [place] CATH LAB;  Service: Cardiovascular;  Laterality: Left;  ? TRANSPHENOIDAL / TRANSNASAL HYPOPHYSECTOMY / RESECTION PITUITARY TUMOR  12/2018  ? [facility]  ? VIDEO BRONCHOSCOPY WITH RADIAL ENDOBRONCHIAL ULTRASOUND  01/13/2022  ? Procedure: RADIAL ENDOBRONCHIAL ULTRASOUND;  Surgeon:  Garner Nash, DO;  Location: [place] ENDOSCOPY;  Service: Pulmonary;;  ? ? ?Family History  ?Problem Relation Age of Onset  ? Heart attack Father   ?     died of MI at age 27  ? Cancer Father   ? Anesthesia problems Neg Hx   ? Hypotension Neg Hx   ?  Malignant hyperthermia Neg Hx   ? Pseudochol deficiency Neg Hx   ? Colon cancer Neg Hx   ? Esophageal cancer Neg Hx   ? Prostate cancer Neg Hx   ? Rectal cancer Neg Hx   ? Stomach cancer Neg Hx   ? ? ? ?Social History  ? ?Tobacco Use  ?Smoking Status Former  ? Packs/day: 1.00  ? Years: 18.00  ? Pack years: 18.00  ? Types: Cigarettes  ? Quit date: 12/17/2007  ? Years since quitting: 14.1  ?Smokeless Tobacco Never  ?  ?Social History  ? ?Substance and Sexual Activity  ?Alcohol Use Yes  ? Comment: occasional  ? ? ? ?No Known Allergies ? ?Current Outpatient Medications  ?Medication Sig Dispense Refill  ? amoxicillin (AMOXIL) 500 MG capsule TAKE 4 CAPSULES BY MOUTH 1 HOUR PRIOR TO DENTAL APPOINTMENT 20 capsule 1  ? aspirin EC 81 MG tablet Take 1 tablet (81 mg total) by mouth daily.    ? atorvastatin (LIPITOR) 40 MG tablet Take 1 tablet (40 mg total) by mouth daily. (Patient taking differently: Take 40 mg by mouth at bedtime.) 90 tablet 3  ? DULoxetine HCl 30 MG CSDR Take 30 mg by mouth 2 (two) times daily.    ? ezetimibe (ZETIA) 10 MG tablet Take 1 tablet by mouth daily. Patient must schedule annual appointment for future refills 90 tablet 0  ? fish oil-omega-3 fatty acids 1000 MG capsule Take 1 capsule (1 g total) by mouth daily. 60 capsule 1  ? levothyroxine (SYNTHROID) 25 MCG tablet Take 25 mcg by mouth daily.    ? lisinopril (ZESTRIL) 2.5 MG tablet Take 2.5 mg by mouth daily.    ? metFORMIN (GLUCOPHAGE) 500 MG tablet Take 1 tablet (500 mg total) by mouth daily with breakfast. (Patient taking differently: Take 500 mg by mouth 2 (two) times daily with a meal.) 90 tablet 3  ? metoprolol tartrate (LOPRESSOR) 25 MG tablet Take 1 tablet by mouth two times daily. PATIENT NEEDS TO  SCHEDULE AND OFFICE VISIT FOR FUTURE REFILLS. 60 tablet 0  ? Multiple Vitamin (MULTIVITAMIN) tablet Take 1 tablet by mouth daily. 30 tablet 10  ? nitroGLYCERIN (NITROSTAT) 0.4 MG SL tablet Place 0.4 mg under t

## 2022-01-28 NOTE — H&P (View-Only) (Signed)
? ?   [address] ?      [place] [address] ?            [phone]   ?                 ?Steven Hale. ?[place] Record #[id number] ?Date of Birth: 08-05-1956 ? ?Referring: Garner Nash, DO ?Primary Care: Tana Coast, MD ?Primary Cardiologist: Kirk Ruths, MD ? ?Chief Complaint:    ?Chief Complaint  ?Patient presents with  ? Lung Lesion  ?  Surgical consult, PET Scan 01/05/22, Chest CT 12/31/21, Bronch 01/13/22  ? ? ?History of Present Illness:    ?Steven Hale. 66 y.o. 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  He is a former smoker who quit in 2009, but has been followed at the [facility] for a 2 cm pulmonary nodule.  He recently underwent a PET/CT which showed minimal activity as well as a bronchoscopy which confirmed the diagnosis. ? ?In regards to his symptoms, he does endorse some shortness of breath when climbing multiple flights of stairs.  He has undergone coronary revascularization back in 2012.  He denies any neurologic symptoms or weight changes. ? ?  ? ? ? ? ? ?[name]: ?At the time of surgery this patient?s most appropriate activity status/level should be described as: ?[x]     0    Normal activity, no symptoms ?[]     1    Restricted in physical strenuous activity but ambulatory, able to do out light work ?[]     2    Ambulatory and capable of self care, unable to do work activities, up and about               >50 % of waking hours                              ?[]     3    Only limited self care, in bed greater than 50% of waking hours ?[]     4    Completely disabled, no self care, confined to bed or chair ?[]     5    Moribund ? ? ?Past Medical History:  ?Diagnosis Date  ? Anemia, unspecified   ? Bell's palsy   ? resolved - no deficits  ? Blood transfusion   ? during treatment for Ca  ? Blood transfusion without reported diagnosis   ? CAD (coronary artery disease)   ? a. s/p multiple PCIs;  b. s/p CABG in  09/2010 (LIMA-LAD, SVG-OM1, SVG-distal RCA/OM2);  Cardiac cath in 12/2015: Mild LAD disease, occluded mid LCX and proximal RCA (left to right collaterals), Occluded SVG to RCA and atretic LIMA. Patent SVG to OM  ? Chronic back pain   ? Diabetes mellitus without complication ([place])   ? type 2  ? DJD (degenerative joint disease)   ? low back & all over   ? GERD (gastroesophageal reflux disease)   ? Helicobacter pylori (H. pylori) infection   ? Hemorrhoids   ? History of shingles   ? Hyperlipidemia   ? Hypertension   ? Impotence of organic origin   ? Myocardial infarction [facility])   ? 2005 and 2012   ? PAD (peripheral artery disease) ([place])   ? a. s/p bilat SFA stents;  b. ABIs 11/2012: R 0.99, L 0.86.  ? Pancreatic cancer ([place])   ?  surgery 2009 cyst removed  ? Pituitary adenoma ([place])   ? surgery at [facility] Feb 2020  ? Sleep apnea 06/05/2019  ? does not use cpap  ? Substance abuse ([place])   ? drug and alcohol abuse  ? ? ?Past Surgical History:  ?Procedure Laterality Date  ? ABDOMINAL AORTAGRAM N/A 09/13/2013  ? Procedure: ABDOMINAL AORTAGRAM;  Surgeon: Wellington Hampshire, MD;  Location: [facility] CATH LAB;  Service: Cardiovascular;  Laterality: N/A;  ? ABDOMINAL AORTAGRAM N/A 08/29/2014  ? Procedure: ABDOMINAL AORTAGRAM;  Surgeon: Wellington Hampshire, MD;  Location: [facility] CATH LAB;  Service: Cardiovascular;  Laterality: N/A;  ? ABDOMINAL AORTOGRAM W/LOWER EXTREMITY N/A 02/19/2021  ? Procedure: ABDOMINAL AORTOGRAM W/LOWER EXTREMITY;  Surgeon: Wellington Hampshire, MD;  Location: [place] CV LAB;  Service: Cardiovascular;  Laterality: N/A;  ? BACK SURGERY    ? 1992  ? BRONCHIAL BIOPSY  01/13/2022  ? Procedure: BRONCHIAL BIOPSIES;  Surgeon: Garner Nash, DO;  Location: [place] ENDOSCOPY;  Service: Pulmonary;;  ? BRONCHIAL BRUSHINGS  01/13/2022  ? Procedure: BRONCHIAL BRUSHINGS;  Surgeon: Garner Nash, DO;  Location: [place];  Service: Pulmonary;;  ? BRONCHIAL NEEDLE ASPIRATION BIOPSY  01/13/2022  ? Procedure: BRONCHIAL NEEDLE ASPIRATION  BIOPSIES;  Surgeon: Garner Nash, DO;  Location: [place];  Service: Pulmonary;;  ? CARDIAC CATHETERIZATION N/A 12/25/2015  ? Procedure: Left Heart Cath and Cors/Grafts Angiography;  Surgeon: Wellington Hampshire, MD;  Location: [place] CV LAB;  Service: Cardiovascular;  Laterality: N/A;  ? COLONOSCOPY    ? CORONARY ARTERY BYPASS GRAFT  nov 2011  ? x 4  ? ENDARTERECTOMY FEMORAL Right 03/03/2021  ? Procedure: RIGHT FEMORAL ENDARTERECTOMY;  Surgeon: Cherre Robins, MD;  Location: [facility] OR;  Service: Vascular;  Laterality: Right;  ? ESOPHAGOGASTRODUODENOSCOPY (EGD) WITH PROPOFOL N/A 04/02/2017  ? Procedure: ESOPHAGOGASTRODUODENOSCOPY (EGD) WITH PROPOFOL;  Surgeon: Carol Ada, MD;  [facility]OS[facility];  Service: Endoscopy;  Laterality: N/A;  ? HEMORRHOID SURGERY  10/30/2011  ? Procedure: HEMORRHOIDECTOMY;  Surgeon: Harl Bowie, MD;  Location: [place];  Service: General;  Laterality: N/A;  ? JOINT REPLACEMENT    ? pancreatic  cancer  05/10/2008  ? Resection of distal panrease and spleen  ? PARTIAL KNEE ARTHROPLASTY Right 11/19/2014  ? Procedure: RIGHT UNI-KNEE ARTHROPLASTY MEDIALLY;  Surgeon: Mauri Pole, MD;  [facility];  Service: Orthopedics;  Laterality: Right;  ? PATCH ANGIOPLASTY Right 03/03/2021  ? Procedure: PATCH ANGIOPLASTY;  Surgeon: Cherre Robins, MD;  Location: [place];  Service: Vascular;  Laterality: Right;  ? PERIPHERAL VASCULAR CATHETERIZATION  Oct 2014  ? Lt SFA PTA  ? PERIPHERAL VASCULAR CATHETERIZATION  Oct 2015  ? ISR Lt SFA-PTA  ? right partial knee replacement    ? splenectomy    ? THROMBECTOMY FEMORAL ARTERY Left 09/13/2013  ? Procedure: THROMBECTOMY FEMORAL ARTERY;  Surgeon: Wellington Hampshire, MD;  Location: [place] CATH LAB;  Service: Cardiovascular;  Laterality: Left;  ? TRANSPHENOIDAL / TRANSNASAL HYPOPHYSECTOMY / RESECTION PITUITARY TUMOR  12/2018  ? [facility]  ? VIDEO BRONCHOSCOPY WITH RADIAL ENDOBRONCHIAL ULTRASOUND  01/13/2022  ? Procedure: RADIAL ENDOBRONCHIAL ULTRASOUND;  Surgeon:  Garner Nash, DO;  Location: [place] ENDOSCOPY;  Service: Pulmonary;;  ? ? ?Family History  ?Problem Relation Age of Onset  ? Heart attack Father   ?     died of MI at age 45  ? Cancer Father   ? Anesthesia problems Neg Hx   ? Hypotension Neg Hx   ?  Malignant hyperthermia Neg Hx   ? Pseudochol deficiency Neg Hx   ? Colon cancer Neg Hx   ? Esophageal cancer Neg Hx   ? Prostate cancer Neg Hx   ? Rectal cancer Neg Hx   ? Stomach cancer Neg Hx   ? ? ? ?Social History  ? ?Tobacco Use  ?Smoking Status Former  ? Packs/day: 1.00  ? Years: 18.00  ? Pack years: 18.00  ? Types: Cigarettes  ? Quit date: 12/17/2007  ? Years since quitting: 14.1  ?Smokeless Tobacco Never  ?  ?Social History  ? ?Substance and Sexual Activity  ?Alcohol Use Yes  ? Comment: occasional  ? ? ? ?No Known Allergies ? ?Current Outpatient Medications  ?Medication Sig Dispense Refill  ? amoxicillin (AMOXIL) 500 MG capsule TAKE 4 CAPSULES BY MOUTH 1 HOUR PRIOR TO DENTAL APPOINTMENT 20 capsule 1  ? aspirin EC 81 MG tablet Take 1 tablet (81 mg total) by mouth daily.    ? atorvastatin (LIPITOR) 40 MG tablet Take 1 tablet (40 mg total) by mouth daily. (Patient taking differently: Take 40 mg by mouth at bedtime.) 90 tablet 3  ? DULoxetine HCl 30 MG CSDR Take 30 mg by mouth 2 (two) times daily.    ? ezetimibe (ZETIA) 10 MG tablet Take 1 tablet by mouth daily. Patient must schedule annual appointment for future refills 90 tablet 0  ? fish oil-omega-3 fatty acids 1000 MG capsule Take 1 capsule (1 g total) by mouth daily. 60 capsule 1  ? levothyroxine (SYNTHROID) 25 MCG tablet Take 25 mcg by mouth daily.    ? lisinopril (ZESTRIL) 2.5 MG tablet Take 2.5 mg by mouth daily.    ? metFORMIN (GLUCOPHAGE) 500 MG tablet Take 1 tablet (500 mg total) by mouth daily with breakfast. (Patient taking differently: Take 500 mg by mouth 2 (two) times daily with a meal.) 90 tablet 3  ? metoprolol tartrate (LOPRESSOR) 25 MG tablet Take 1 tablet by mouth two times daily. PATIENT NEEDS TO  SCHEDULE AND OFFICE VISIT FOR FUTURE REFILLS. 60 tablet 0  ? Multiple Vitamin (MULTIVITAMIN) tablet Take 1 tablet by mouth daily. 30 tablet 10  ? nitroGLYCERIN (NITROSTAT) 0.4 MG SL tablet Place 0.4 mg under t

## 2022-01-30 ENCOUNTER — Telehealth (HOSPITAL_COMMUNITY): Payer: Self-pay | Admitting: *Deleted

## 2022-01-30 ENCOUNTER — Other Ambulatory Visit: Payer: Self-pay | Admitting: *Deleted

## 2022-01-30 ENCOUNTER — Other Ambulatory Visit: Payer: Self-pay

## 2022-01-30 ENCOUNTER — Encounter: Payer: Self-pay | Admitting: *Deleted

## 2022-01-30 ENCOUNTER — Institutional Professional Consult (permissible substitution) (INDEPENDENT_AMBULATORY_CARE_PROVIDER_SITE_OTHER): Payer: No Typology Code available for payment source | Admitting: Thoracic Surgery (Cardiothoracic Vascular Surgery)

## 2022-01-30 ENCOUNTER — Other Ambulatory Visit: Payer: Self-pay | Admitting: Thoracic Surgery (Cardiothoracic Vascular Surgery)

## 2022-01-30 VITALS — BP 133/80 | HR 64 | Resp 20 | Ht 69.0 in | Wt 237.0 lb

## 2022-01-30 DIAGNOSIS — R911 Solitary pulmonary nodule: Secondary | ICD-10-CM

## 2022-01-30 NOTE — Telephone Encounter (Signed)
Left message on voicemail per DPR in reference to upcoming appointment scheduled on 02/02/2022 at 7:45 with detailed instructions given per Myocardial Perfusion Study Information Sheet for the test. LM to arrive 15 minutes early, and that it is imperative to arrive on time for appointment to keep from having the test rescheduled. If you need to cancel or reschedule your appointment, please call the office within 24 hours of your appointment. Failure to do so may result in a cancellation of your appointment, and a $50 no show fee. Phone number given for call back for any questions.  ? ?

## 2022-02-02 ENCOUNTER — Other Ambulatory Visit: Payer: Self-pay | Admitting: Cardiovascular Disease

## 2022-02-02 ENCOUNTER — Other Ambulatory Visit: Payer: Self-pay

## 2022-02-02 ENCOUNTER — Ambulatory Visit (HOSPITAL_COMMUNITY): Payer: No Typology Code available for payment source | Attending: Cardiology

## 2022-02-02 DIAGNOSIS — R911 Solitary pulmonary nodule: Secondary | ICD-10-CM | POA: Insufficient documentation

## 2022-02-02 DIAGNOSIS — Z01810 Encounter for preprocedural cardiovascular examination: Secondary | ICD-10-CM | POA: Diagnosis not present

## 2022-02-02 LAB — MYOCARDIAL PERFUSION IMAGING
LV dias vol: 96 mL (ref 62–150)
LV sys vol: 39 mL
Nuc Stress EF: 59 %
Peak HR: 72 {beats}/min
Rest HR: 55 {beats}/min
Rest Nuclear Isotope Dose: 10.2 mCi
SRS: 0
SSS: 1
ST Depression (mm): 0 mm
Stress Nuclear Isotope Dose: 31.1 mCi
TID: 1.11

## 2022-02-02 MED ORDER — TECHNETIUM TC 99M TETROFOSMIN IV KIT
31.1000 | PACK | Freq: Once | INTRAVENOUS | Status: AC | PRN
  Administered 2022-02-02: 31.1 via INTRAVENOUS
  Filled 2022-02-02: qty 32

## 2022-02-02 MED ORDER — REGADENOSON 0.4 MG/5ML IV SOLN
0.4000 mg | Freq: Once | INTRAVENOUS | Status: AC
  Administered 2022-02-02: 0.4 mg via INTRAVENOUS

## 2022-02-02 MED ORDER — TECHNETIUM TC 99M TETROFOSMIN IV KIT
10.2000 | PACK | Freq: Once | INTRAVENOUS | Status: AC | PRN
  Administered 2022-02-02: 10.2 via INTRAVENOUS
  Filled 2022-02-02: qty 11

## 2022-02-02 NOTE — Telephone Encounter (Signed)
Refill request

## 2022-02-03 ENCOUNTER — Other Ambulatory Visit (HOSPITAL_COMMUNITY): Payer: Self-pay

## 2022-02-03 MED ORDER — EZETIMIBE 10 MG PO TABS
10.0000 mg | ORAL_TABLET | Freq: Every day | ORAL | 3 refills | Status: DC
  Filled 2022-02-03 – 2022-03-27 (×2): qty 90, 90d supply, fill #0
  Filled 2022-07-21: qty 90, 90d supply, fill #1

## 2022-02-03 MED ORDER — PANTOPRAZOLE SODIUM 40 MG PO TBEC
40.0000 mg | DELAYED_RELEASE_TABLET | Freq: Every day | ORAL | 11 refills | Status: DC
  Filled 2022-02-03: qty 30, 30d supply, fill #0
  Filled 2022-03-27: qty 30, 30d supply, fill #1
  Filled 2022-04-28: qty 30, 30d supply, fill #2
  Filled 2022-05-26: qty 30, 30d supply, fill #3
  Filled 2022-06-21: qty 30, 30d supply, fill #4
  Filled 2022-07-21: qty 30, 30d supply, fill #5

## 2022-02-03 MED ORDER — METOPROLOL TARTRATE 25 MG PO TABS
ORAL_TABLET | ORAL | 11 refills | Status: DC
  Filled 2022-02-03: qty 60, 30d supply, fill #0
  Filled 2022-05-26: qty 60, 30d supply, fill #1
  Filled 2022-06-21: qty 60, 30d supply, fill #2
  Filled 2022-07-21: qty 60, 30d supply, fill #3

## 2022-02-04 ENCOUNTER — Other Ambulatory Visit (HOSPITAL_COMMUNITY): Payer: Self-pay

## 2022-02-04 ENCOUNTER — Telehealth: Payer: Self-pay | Admitting: Genetic Counselor

## 2022-02-04 MED ORDER — AMOXICILLIN 500 MG PO CAPS
500.0000 mg | ORAL_CAPSULE | Freq: Four times a day (QID) | ORAL | 0 refills | Status: DC
  Filled 2022-02-04: qty 28, 7d supply, fill #0

## 2022-02-04 NOTE — Telephone Encounter (Signed)
Scheduled appt per 3/21 referral. Pt is aware of appt date and time. Pt is aware to arrive 15 mins prior to appt time and to bring and updated insurance card. Pt is aware of appt location.   ?

## 2022-02-05 ENCOUNTER — Ambulatory Visit (HOSPITAL_COMMUNITY): Admission: RE | Admit: 2022-02-05 | Payer: No Typology Code available for payment source | Source: Ambulatory Visit

## 2022-02-05 ENCOUNTER — Encounter (HOSPITAL_COMMUNITY)
Admission: RE | Admit: 2022-02-05 | Discharge: 2022-02-05 | Disposition: A | Discharge status: Home / Self Care | Payer: No Typology Code available for payment source | Source: Ambulatory Visit | Attending: Thoracic Surgery (Cardiothoracic Vascular Surgery) | Admitting: Thoracic Surgery (Cardiothoracic Vascular Surgery)

## 2022-02-05 ENCOUNTER — Inpatient Hospital Stay (HOSPITAL_COMMUNITY): Admission: RE | Admit: 2022-02-05 | Payer: Non-veteran care | Source: Ambulatory Visit

## 2022-02-05 ENCOUNTER — Ambulatory Visit (HOSPITAL_COMMUNITY)
Admission: RE | Admit: 2022-02-05 | Discharge: 2022-02-05 | Disposition: A | Discharge status: Home / Self Care | Payer: No Typology Code available for payment source | Source: Ambulatory Visit | Attending: Thoracic Surgery (Cardiothoracic Vascular Surgery) | Admitting: Thoracic Surgery (Cardiothoracic Vascular Surgery)

## 2022-02-05 ENCOUNTER — Other Ambulatory Visit: Payer: Self-pay

## 2022-02-05 ENCOUNTER — Encounter (HOSPITAL_COMMUNITY): Payer: Self-pay

## 2022-02-05 VITALS — BP 125/78 | HR 65 | Temp 98.3°F | Resp 18 | Ht 69.0 in | Wt 229.9 lb

## 2022-02-05 DIAGNOSIS — Z955 Presence of coronary angioplasty implant and graft: Secondary | ICD-10-CM | POA: Insufficient documentation

## 2022-02-05 DIAGNOSIS — G4733 Obstructive sleep apnea (adult) (pediatric): Secondary | ICD-10-CM | POA: Diagnosis not present

## 2022-02-05 DIAGNOSIS — Z20822 Contact with and (suspected) exposure to covid-19: Secondary | ICD-10-CM | POA: Diagnosis not present

## 2022-02-05 DIAGNOSIS — I739 Peripheral vascular disease, unspecified: Secondary | ICD-10-CM | POA: Insufficient documentation

## 2022-02-05 DIAGNOSIS — K219 Gastro-esophageal reflux disease without esophagitis: Secondary | ICD-10-CM | POA: Insufficient documentation

## 2022-02-05 DIAGNOSIS — I10 Essential (primary) hypertension: Secondary | ICD-10-CM | POA: Insufficient documentation

## 2022-02-05 DIAGNOSIS — I252 Old myocardial infarction: Secondary | ICD-10-CM | POA: Diagnosis not present

## 2022-02-05 DIAGNOSIS — Z01818 Encounter for other preprocedural examination: Secondary | ICD-10-CM

## 2022-02-05 DIAGNOSIS — G510 Bell's palsy: Secondary | ICD-10-CM | POA: Insufficient documentation

## 2022-02-05 DIAGNOSIS — Z01812 Encounter for preprocedural laboratory examination: Secondary | ICD-10-CM | POA: Insufficient documentation

## 2022-02-05 DIAGNOSIS — R911 Solitary pulmonary nodule: Secondary | ICD-10-CM | POA: Diagnosis present

## 2022-02-05 DIAGNOSIS — Z95820 Peripheral vascular angioplasty status with implants and grafts: Secondary | ICD-10-CM | POA: Diagnosis not present

## 2022-02-05 DIAGNOSIS — I2510 Atherosclerotic heart disease of native coronary artery without angina pectoris: Secondary | ICD-10-CM | POA: Insufficient documentation

## 2022-02-05 DIAGNOSIS — Z951 Presence of aortocoronary bypass graft: Secondary | ICD-10-CM | POA: Insufficient documentation

## 2022-02-05 DIAGNOSIS — E785 Hyperlipidemia, unspecified: Secondary | ICD-10-CM | POA: Diagnosis not present

## 2022-02-05 DIAGNOSIS — Z87891 Personal history of nicotine dependence: Secondary | ICD-10-CM | POA: Diagnosis not present

## 2022-02-05 LAB — COMPREHENSIVE METABOLIC PANEL
ALT: 26 U/L (ref 0–44)
AST: 28 U/L (ref 15–41)
Albumin: 4.9 g/dL (ref 3.5–5.0)
Alkaline Phosphatase: 62 U/L (ref 38–126)
Anion gap: 8 (ref 5–15)
BUN: 11 mg/dL (ref 8–23)
CO2: 30 mmol/L (ref 22–32)
Calcium: 10.4 mg/dL — ABNORMAL HIGH (ref 8.9–10.3)
Chloride: 100 mmol/L (ref 98–111)
Creatinine, Ser: 1.16 mg/dL (ref 0.61–1.24)
GFR, Estimated: >60 mL/min (ref >60)
Glucose, Bld: 101 mg/dL — ABNORMAL HIGH (ref 70–99)
Potassium: 4.3 mmol/L (ref 3.5–5.1)
Sodium: 138 mmol/L (ref 135–145)
Total Bilirubin: 1 mg/dL (ref 0.3–1.2)
Total Protein: 7.9 g/dL (ref 6.5–8.1)

## 2022-02-05 LAB — CBC
HCT: 39.8 % (ref 39.0–52.0)
Hemoglobin: 13.1 g/dL (ref 13.0–17.0)
MCH: 30.2 pg (ref 26.0–34.0)
MCHC: 32.9 g/dL (ref 30.0–36.0)
MCV: 91.7 fL (ref 80.0–100.0)
Platelets: 292 10*3/uL (ref 150–400)
RBC: 4.34 MIL/uL (ref 4.22–5.81)
RDW: 14.7 % (ref 11.5–15.5)
WBC: 4.9 10*3/uL (ref 4.0–10.5)
nRBC: 0 % (ref 0.0–0.2)

## 2022-02-05 LAB — GLUCOSE, CAPILLARY: Glucose-Capillary: 108 mg/dL — ABNORMAL HIGH (ref 70–99)

## 2022-02-05 LAB — TYPE AND SCREEN
ABO/RH(D): O POS
Antibody Screen: NEGATIVE

## 2022-02-05 LAB — URINALYSIS, ROUTINE W REFLEX MICROSCOPIC
Bacteria, UA: NONE SEEN
Bilirubin Urine: NEGATIVE
Glucose, UA: >=500 mg/dL — AB
Hgb urine dipstick: NEGATIVE
Ketones, ur: NEGATIVE mg/dL
Leukocytes,Ua: NEGATIVE
Nitrite: NEGATIVE
Protein, ur: NEGATIVE mg/dL
Specific Gravity, Urine: 1.026 (ref 1.005–1.030)
pH: 5 (ref 5.0–8.0)

## 2022-02-05 LAB — PULMONARY FUNCTION TEST
DL/VA % pred: 88 %
DL/VA: 3.69 ml/min/mmHg/L
DLCO cor % pred: 79 %
DLCO cor: 20.75 ml/min/mmHg
DLCO unc % pred: 76 %
DLCO unc: 19.82 ml/min/mmHg
FEF 25-75 Pre: 3.18 L/sec
FEF2575-%Pred-Pre: 122 %
FEV1-%Pred-Pre: 114 %
FEV1-Pre: 3.31 L
FEV1FVC-%Pred-Pre: 101 %
FEV6-%Pred-Pre: 115 %
FEV6-Pre: 4.18 L
FEV6FVC-%Pred-Pre: 103 %
FVC-%Pred-Pre: 111 %
FVC-Pre: 4.2 L
Pre FEV1/FVC ratio: 79 %
Pre FEV6/FVC Ratio: 99 %
RV % pred: 95 %
RV: 2.2 L
TLC % pred: 93 %
TLC: 6.35 L

## 2022-02-05 LAB — SURGICAL PCR SCREEN
MRSA, PCR: NEGATIVE
Staphylococcus aureus: NEGATIVE

## 2022-02-05 LAB — PROTIME-INR
INR: 1 (ref 0.8–1.2)
Prothrombin Time: 13.1 seconds (ref 11.4–15.2)

## 2022-02-05 LAB — APTT: aPTT: 26 seconds (ref 24–36)

## 2022-02-05 NOTE — Progress Notes (Addendum)
PCP - Dr. Daiva Huge and [place] ?Cardiologist - Dr. Fletcher Anon ? ?Chest x-ray - today ?EKG - 01/20/22 ?Stress Test - 02/02/22 ? ?Sleep Study - Years ago ?CPAP - no ? ?Fasting Blood Sugar - CBG today was 108 ?Checks Blood Sugar whenever he thinks about it and it's not daily ?A1C - 7.3 on 12/31/21 at the [facility] - results found in [place] ? ?Aspirin Instructions: will not take DOS ? ?COVID TEST- today, 02/05/22 ? ? ?Anesthesia review: Yes  ? ?Patient denies shortness of breath, fever, cough and chest pain at PAT appointment ? ? ?All instructions explained to the patient, with a verbal understanding of the material. Patient agrees to go over the instructions while at home for a better understanding. Patient also instructed to self quarantine after being tested for COVID-19. The opportunity to ask questions was provided. ? ? ?

## 2022-02-05 NOTE — Pre-Procedure Instructions (Signed)
Steven Hale. ? 02/05/2022  ? ? Surgical Instructions ? Your procedure is scheduled on Monday, February 09, 2022 at 7:30 AM. ? Report to [facility] Main Entrance "A" at 5:30 A.M., then check in with the Admitting office. ? Call this number if you have problems the morning of surgery: [phone] ? ? If you have any questions prior to your surgery date call [phone]: Open Monday-Friday 8am-4pm ? ? Remember: ? Do not eat or drink after midnight the night before your surgery.  ? ? Take these medicines the morning of surgery with A SIP OF WATER: Duloxetine, Ezetimibe (Zetia), Levothyroxine (Synthroid), Metoprolol (Lopressor), Pantoprazole (Protonix), Pregabalin (Lyrica), Nitroglycerin (Nitrostat) - if needed ? ? ?As of today, STOP Aleve, Naproxen, Ibuprofen, Motrin, Advil, Goody's, BC's, all herbal medications, fish oil and all vitamins. ? ?WHAT DO I DO ABOUT MY DIABETES MEDICATION? ? ?Do not take oral diabetes medicines (pills) the morning of surgery. (No Metformin day of surgery) ? ?The day of surgery, do not take other diabetes injectables, including  Ozempic (Semagutide), Byetta (exenatide), Bydureon (exenatide ER), Victoza (liraglutide), or Trulicity (dulaglutide). ? ?HOW TO MANAGE YOUR DIABETES ?BEFORE AND AFTER SURGERY ? ?Why is it important to control my blood sugar before and after surgery? ?Improving blood sugar levels before and after surgery helps healing and can limit problems. ?A way of improving blood sugar control is eating a healthy diet by: ? Eating less sugar and carbohydrates ? Increasing activity/exercise ? Talking with your doctor about reaching your blood sugar goals ?High blood sugars (greater than 180 mg/dL) can raise your risk of infections and slow your recovery, so you will need to focus on controlling your diabetes during the weeks before surgery. ?Make sure that the doctor who takes care of your diabetes knows about your planned surgery including the date and location. ? ?How do I  manage my blood sugar before surgery? ?Check your blood sugar at least 4 times a day, starting 2 days before surgery, to make sure that the level is not too high or low. ? ?Check your blood sugar the morning of your surgery when you wake up and every 2 hours until you get to the Short Stay unit. ? ?If your blood sugar is less than 70 mg/dL, you will need to treat for low blood sugar: ?Do not take insulin. ?Treat a low blood sugar (less than 70 mg/dL) with ? cup of clear juice (cranberry or apple), 4 glucose tablets, OR glucose gel. ?Recheck blood sugar in 15 minutes after treatment (to make sure it is greater than 70 mg/dL). If your blood sugar is not greater than 70 mg/dL on recheck, call [phone] for further instructions. ?Report your blood sugar to the short stay nurse when you get to Short Stay. ? ?If you are admitted to the hospital after surgery: ?Your blood sugar will be checked by the staff and you will probably be given insulin after surgery (instead of oral diabetes medicines) to make sure you have good blood sugar levels. ?The goal for blood sugar control after surgery is 80-180 mg/dL.  ? ?         ?Do not wear jewelry. ?Do not wear lotions, powders, colognes or deodorant. ?Men may shave face and neck. ?Do not bring valuables to the hospital. ?Do not wear nail polish, gel polish, artificial nails, or any other type of covering on natural nails (fingers and toes) ?If you have artificial nails or gel coating that need to be removed by a nail  salon, please have this removed prior to surgery. Artificial nails or gel coating may interfere with anesthesia's ability to adequately monitor your vital signs. ? ?[facility] is not responsible for any belongings or valuables. .  ?  ?Contacts, glasses, hearing aids, dentures or partials may not be worn into surgery, please bring cases for these belongings ?  ?For patients admitted to the hospital, discharge time will be determined by your treatment team. ?   ?Patients discharged the day of surgery will not be allowed to drive home, and someone needs to stay with them for 24 hours. ? ? ?SURGICAL WAITING ROOM VISITATION ?Patients having surgery or a procedure in a hospital may have two support people. ?Children under the age of 63 must have an adult with them who is not the patient. ?They may stay in the waiting area during the procedure and may switch out with other visitors. If the patient needs to stay at the hospital during part of their recovery, the visitor guidelines for inpatient rooms apply. ? ?Please refer to the [facility] website for the visitor guidelines for Inpatients (after your surgery is over and you are in a regular room).  ? ?Special instructions:   ? ?Oral Hygiene is also important to reduce your risk of infection.  Remember - BRUSH YOUR TEETH THE MORNING OF SURGERY WITH YOUR REGULAR TOOTHPASTE ? ? ?[facility]- Preparing For Surgery ? ?Before surgery, you can play an important role. Because skin is not sterile, your skin needs to be as free of germs as possible. You can reduce the number of germs on your skin by washing with CHG (chlorahexidine gluconate) Soap before surgery.  CHG is an antiseptic cleaner which kills germs and bonds with the skin to continue killing germs even after washing.   ? ? ?Please do not use if you have an allergy to CHG or antibacterial soaps. If your skin becomes reddened/irritated stop using the CHG.  ?Do not shave (including legs and underarms) for at least 48 hours prior to first CHG shower. It is OK to shave your face. ? ?Please follow these instructions carefully. ?  ? ? Shower the NIGHT BEFORE SURGERY and the MORNING OF SURGERY with CHG Soap.  ?If you chose to wash your hair, wash your hair first as usual with your normal shampoo. After you shampoo, rinse your hair and body thoroughly to remove the shampoo.  Then [employer] and genitals (private parts) with your normal soap and rinse thoroughly to remove  soap. ? ?After that Use CHG Soap as you would any other liquid soap. You can apply CHG directly to the skin and wash gently with a scrungie or a clean washcloth.  ? ?Apply the CHG Soap to your body ONLY FROM THE NECK DOWN.  Do not use on open wounds or open sores. Avoid contact with your eyes, ears, mouth and genitals (private parts). Wash Face and genitals (private parts)  with your normal soap.  ? ?Wash thoroughly, paying special attention to the area where your surgery will be performed. ? ?Thoroughly rinse your body with warm water from the neck down. ? ?DO NOT shower/wash with your normal soap after using and rinsing off the CHG Soap. ? ?Pat yourself dry with a CLEAN TOWEL. ? ?Wear CLEAN PAJAMAS to bed the night before surgery ? ?Place CLEAN SHEETS on your bed the night before your surgery ? ?DO NOT SLEEP WITH PETS. ? ? ?Day of Surgery: ? ?Take a shower with CHG soap. ?Wear  Clean/Comfortable clothing the morning of surgery ?Do not apply any deodorants/lotions.   ?Remember to brush your teeth WITH YOUR REGULAR TOOTHPASTE. ? ? ? ?If you received a COVID test during your pre-op visit  it is requested that you wear a mask when out in public, stay away from anyone that may not be feeling well and notify your surgeon if you develop symptoms. If you have been in contact with anyone that has tested positive in the last 10 days please notify you surgeon. ? ?  ?Please read over the following fact sheets that you were given.  ?  ? ? ?  ?

## 2022-02-06 LAB — SARS CORONAVIRUS 2 (TAT 6-24 HRS): SARS Coronavirus 2: NEGATIVE

## 2022-02-06 NOTE — Anesthesia Preprocedure Evaluation (Addendum)
Anesthesia Evaluation  ?Patient identified by MRN, date of birth, ID band ?Patient awake ? ? ? ?Reviewed: ?Allergy & Precautions, NPO status , Patient's Chart, lab work & pertinent test results ? ?Airway ?Mallampati: II ? ?TM Distance: >3 FB ?Neck ROM: Full ? ? ? Dental ? ?(+) Dental Advisory Given, Partial Upper, Partial Lower ?  ?Pulmonary ?sleep apnea , former smoker,  ?Right lung nodule ?  ?Pulmonary exam normal ?breath sounds clear to auscultation ? ? ? ? ? ? Cardiovascular ?hypertension, Pt. on medications and Pt. on home beta blockers ?+ CAD, + Past MI, + CABG and + Peripheral Vascular Disease  ?Normal cardiovascular exam ?Rhythm:Regular Rate:Normal ? ? ?  ?Neuro/Psych ?H/o pituitary adenoma ? Neuromuscular disease negative psych ROS  ? GI/Hepatic ?GERD  Medicated,(+)  ?  ? substance abuse ? alcohol use, H/o pancreatic cancer ? ?  ?Endo/Other  ?diabetes, Type 2, Oral Hypoglycemic AgentsHypothyroidism Obesity ? ? Renal/GU ?negative Renal ROS  ? ?  ?Musculoskeletal ? ?(+) Arthritis ,  ? Abdominal ?  ?Peds ? Hematology ?negative hematology ROS ?(+)   ?Anesthesia Other Findings ? ? Reproductive/Obstetrics ? ?  ? ? ? ? ? ? ? ? ? ? ? ? ? ?  ?  ? ? ? ? ? ? ?Anesthesia Physical ?Anesthesia Plan ? ?ASA: 3 ? ?Anesthesia Plan: General  ? ?Post-op Pain Management: Tylenol PO (pre-op)*  ? ?Induction: Intravenous ? ?PONV Risk Score and Plan: 2 and Midazolam, Dexamethasone and Ondansetron ? ?Airway Management Planned: Double Lumen EBT ? ?Additional Equipment:  ? ?Intra-op Plan:  ? ?Post-operative Plan: Extubation in OR ? ?Informed Consent: I have reviewed the patients History and Physical, chart, labs and discussed the procedure including the risks, benefits and alternatives for the proposed anesthesia with the patient or authorized representative who has indicated his/her understanding and acceptance.  ? ? ? ?Dental advisory given ? ?Plan Discussed with: CRNA ? ?Anesthesia Plan Comments:  (PAT note written 02/06/2022 by Myra Gianotti, PA-C. ? ?2 large bore PIV vs CVL, CLEAR SIGHT for BP monitoring ?)  ? ? ? ? ?Anesthesia Quick Evaluation ? ?

## 2022-02-06 NOTE — Progress Notes (Signed)
Anesthesia Chart Review: ? Case: [id number] Date/Time: 02/09/22 0715  ? Procedures:  ?    XI ROBOTIC ASSISTED THORASCOPY-RIGHT MIDDLE LOBECTOMY (Right: Chest) ?    VIDEO BRONCHOSCOPY  ? Anesthesia type: General  ? Pre-op diagnosis: LUNG NODULE  ? Location: [facility] ROOM 10 / [facility] OR  ? Surgeons: Lajuana Matte, MD  ? ?  ? ? ?DISCUSSION: Patient is a 66 year old male scheduled for the above procedure.  He is s/p navigational bronchoscopy on 01/13/2022 for a RML lung nodule. Pathology + well-differentiated neuroendocrine (typical carcinoid) tumor.  Pulmonology referred him to CT surgery and the above procedure recommended. ? ?Other history includes former smoker (quit 11/29/08), HTN, CAD (NSTEMI, s/p DES OM1, failed PCI occluded RCA 04/02/04; ACS 01/07/10, s/p cutting balloon arthrotomy, PTCA AV groove CX and ostial OM; CABG: LIMA-LAD, SVG-OM1, SVG-dRCA-OM2 09/18/10: LHC 12/25/15: patent SVG-OM, occluded SVG-RCA with left to right collaterals, atretic LIMA-LAD with no obstructive disease, medical therapy), PAD (s/p right SFA stent 01/08/09, left SFA stent 02/18/09; left SFA atherectomy, aspiration thrombectomy, balloon angioplasty left SFA 09/13/13; left SFA drug coated balloon angioplasty 08/29/14; right CFA endarterectomy 03/03/21), HLD, GERD, Bell's palsy, anemia, pancreatic cancer (s/p distal pancreatectomy and splenectomy 05/10/08, pathology: "solid pseudo-papillary tumor of the pancreas 9.8 cm.  Margins were negative but the tumor did appear to invade the capsule, consistent with a low grade malignancy"), DM2, pituitary macroadenoma (s/p transphenoidal resection of pituitary mass 01/04/19), OSA (does not use CPAP), back surgery (L4-5 laminectomy 1990's), chronic back pain. Alcohol use is currently documented as "occasional - rare". ?  ?Last cardiology encounters see with Dr. Fletcher Anon was on 01/20/22 for PAD and CAD follow-up. Chronic atypical chest pain was stable.  Claudication had improved following 03/13/21 right CFA  endarterectomy with bovine pericardial patch angioplasty. On Lyrica for peripheral neuropathy.  Hypertension controlled, and LDL at target.  He had pending CT surgery evaluation at that time.  7-month follow-up planned.  Since then Dr. Kipp Brood ordered a preoperative stress test which was done on 02/02/22 and showed findings consistent with prior MI, LVEF 55 to 65%, no ischemia, low risk study.  ? ?Anesthesia team to evaluate on the day of surgery.  His 02/05/2022 preoperative chest x-ray is still in process. ? ? ?VS: BP 125/78   Pulse 65   Temp 36.8 ?C (Oral)   Resp 18   Ht 5\' 9"  (1.753 m)   Wt 104.3 kg   SpO2 97%   BMI 33.95 kg/m?  ? ? ?PROVIDERS: ?Bea Laura, MD is [facility] PCP. Has also seen Delia Chimes, MD locally.  ?- Kathlyn Sacramento, MD is cardiologist. Has also seen Kirk Ruths, MD. ?- Christinia Gully, MD is pulmonologist ?- Jamelle Haring, MD is vascular surgeon ?Angelene Giovanni, MD is neurosurgeon (for pituitary resection). See [facility] Care Everywhere.  ?- Monica Martinez, MD is HEM-[facility]) ?- Silvestre Moment, MD is ENT [facility]). Seen 10/21/21 for right lateral tongue growth. 12/05/21 pathology report: Squamous  hyperplasia with parakeratosis and keratosis, focal subepithelial fibrosis associated with increased numbers of capillaries/small blood vessels, no dysplasia or malignancy identified, fungal stain negative. ? ? ?LABS: Labs reviewed: Acceptable for surgery. A1c 7.3% on 12/31/21 [facility]). ?(all labs ordered are listed, but only abnormal results are displayed) ? ?Labs Reviewed  ?GLUCOSE, CAPILLARY - Abnormal; Notable for the following components:  ?    Result Value  ? Glucose-Capillary 108 (*)   ? All other components within normal limits  ?COMPREHENSIVE METABOLIC PANEL - Abnormal; Notable for the following  components:  ? Glucose, Bld 101 (*)   ? Calcium 10.4 (*)   ? All other components within normal limits  ?URINALYSIS, ROUTINE W REFLEX MICROSCOPIC - Abnormal; Notable for the following  components:  ? Color, Urine AMBER (*)   ? APPearance CLOUDY (*)   ? Glucose, UA >=500 (*)   ? All other components within normal limits  ?SURGICAL PCR SCREEN  ?SARS CORONAVIRUS 2 (TAT 6-24 HRS)  ?CBC  ?PROTIME-INR  ?APTT  ?TYPE AND SCREEN  ? ? ? ?IMAGES: ?CXR 02/05/22: In process. ? ?PET Scan 01/05/22: ?IMPRESSION: ?- Continued enlargement of a RIGHT middle lobe pulmonary nodule with ?only mildly increased metabolic activity only slightly greater than ?mediastinal blood pool. Findings are suspicious for indolent ?neoplasm with slight increase in metabolic activity relative to ?mediastinal blood pool and with continued increase in size. The ?correlate with prior pathology determine whether mucinous neoplasm ?was found at primary pathology particularly of colon cancer at this ?can sometimes show low level uptake. ?- Stable appearance of nodularity about the LEFT adrenal gland and ?without signs of increased metabolic activity in this area. ?Continued attention on follow-up is suggested. To differentiate from ?splenic tissue/splenosis could consider heat damaged red blood cell ?study as warranted. ?- Post distal pancreatectomy and splenectomy. ?- Cholelithiasis. ?- Aortic atherosclerosis and signs of coronary artery disease post ?coronary revascularization. ?  ?CT Super D Chest 12/31/21: ?IMPRESSION: ?1. There has been mild increase in size (on the order of 1-2 mm in ?each dimension) of the right middle lobe lung nodule which now has a ?maximum dimension of 2.2 cm. Of note: This nodule was present on ?remote study from 05/10/2009 measuring 5 mm. Low-grade indolent ?pulmonary neoplasm cannot be excluded. ?2. Stable enlarged low right paratracheal lymph node compared with ?05/03/2020. ?3. Left adrenal adenoma. ?4. Aortic Atherosclerosis (ICD10-I70.0) and Emphysema (ICD10-J43.9). ? ? ?EKG: 01/20/22: Sinus bradycardia 52 bpm ? ? ?CV: ?Nuclear stress test 02/02/22:  ?  Findings are consistent with prior myocardial infarction. The  study is low risk. ?  No ST deviation was noted. ?  LV perfusion is abnormal. There is no evidence of ischemia. There is evidence of infarction. Defect 1: There is a small defect with mild reduction in uptake present in the apical anterior and apex location(s) that is fixed. There is normal wall motion in the defect area. Consistent with artifact. Defect 2: There is a small defect with mild reduction in uptake present in the apical inferior location(s) that is fixed. There is abnormal wall motion in the defect area. Consistent with infarction. ?  Left ventricular function is normal. Nuclear stress EF: 59 %. The left ventricular ejection fraction is normal (55-65%). End diastolic cavity size is normal. End systolic cavity size is normal. ?  Prior study available for comparison from 12/11/2015. ?  ?Fixed perfusion defect at apex and apical anterior wall with normal wall motion, suggests artifact ?Fixed perfusion defect at apical inferior wall with hypokinesis, consistent with prior infarct ?Low risk study ? ? ?Cardiac cath (due to abnormal stress test 12/11/15) ?Prox Cx to Mid Cx lesion, 99% stenosed. The lesion was previously treated with a stent (unknown type). ?Prox LAD to Mid LAD lesion, 30% stenosed. ?Prox RCA lesion, 100% stenosed. ?SVG was injected is normal in caliber, and is anatomically normal. ?SVG was injected . ?Origin lesion, 100% stenosed. ?The left ventricular systolic function is normal. ?  ?1. Significant 2 vessel coronary artery disease with patent SVG to OM 2. SVG to RCA is occluded  and LIMA to LAD is atretic. However, there is no obstructive disease affecting the LAD system. ?The right coronary artery is chronically occluded with good left-to-right collaterals. ?  ?2. Normal LV systolic function and mildly elevated left ventricular end-diastolic pressure. ?  ?Recommendations: ?Continue aggressive medical therapy. The RCA does not appear favorable for CTO PCI. ?  ?  ?Past Medical History:  ?Diagnosis  Date  ? Anemia, unspecified   ? Bell's palsy   ? resolved - no deficits  ? Blood transfusion   ? during treatment for Ca  ? Blood transfusion without reported diagnosis   ? CAD (coronary artery disease)   ? a. s/p multiple PCIs;

## 2022-02-08 ENCOUNTER — Encounter (HOSPITAL_COMMUNITY): Payer: Self-pay | Admitting: Thoracic Surgery (Cardiothoracic Vascular Surgery)

## 2022-02-09 ENCOUNTER — Inpatient Hospital Stay (HOSPITAL_COMMUNITY): Payer: No Typology Code available for payment source | Admitting: Registered Nurse

## 2022-02-09 ENCOUNTER — Inpatient Hospital Stay (HOSPITAL_COMMUNITY): Payer: No Typology Code available for payment source

## 2022-02-09 ENCOUNTER — Inpatient Hospital Stay (HOSPITAL_COMMUNITY)
Admission: RE | Admit: 2022-02-09 | Discharge: 2022-02-11 | DRG: 164 | Disposition: A | Discharge status: Home / Self Care | Payer: No Typology Code available for payment source | Attending: Thoracic Surgery (Cardiothoracic Vascular Surgery) | Admitting: Thoracic Surgery (Cardiothoracic Vascular Surgery)

## 2022-02-09 ENCOUNTER — Encounter (HOSPITAL_COMMUNITY)
Admission: RE | Disposition: A | Discharge status: Home / Self Care | Payer: Self-pay | Source: Home / Self Care | Attending: Thoracic Surgery (Cardiothoracic Vascular Surgery)

## 2022-02-09 ENCOUNTER — Inpatient Hospital Stay (HOSPITAL_COMMUNITY): Payer: No Typology Code available for payment source | Admitting: Vascular Surgery

## 2022-02-09 ENCOUNTER — Encounter (HOSPITAL_COMMUNITY): Payer: Self-pay | Admitting: Thoracic Surgery (Cardiothoracic Vascular Surgery)

## 2022-02-09 ENCOUNTER — Other Ambulatory Visit: Payer: Self-pay

## 2022-02-09 DIAGNOSIS — I252 Old myocardial infarction: Secondary | ICD-10-CM

## 2022-02-09 DIAGNOSIS — I2510 Atherosclerotic heart disease of native coronary artery without angina pectoris: Secondary | ICD-10-CM | POA: Diagnosis present

## 2022-02-09 DIAGNOSIS — I739 Peripheral vascular disease, unspecified: Secondary | ICD-10-CM

## 2022-02-09 DIAGNOSIS — Z7982 Long term (current) use of aspirin: Secondary | ICD-10-CM

## 2022-02-09 DIAGNOSIS — Z8507 Personal history of malignant neoplasm of pancreas: Secondary | ICD-10-CM

## 2022-02-09 DIAGNOSIS — D3A090 Benign carcinoid tumor of the bronchus and lung: Secondary | ICD-10-CM

## 2022-02-09 DIAGNOSIS — Z7984 Long term (current) use of oral hypoglycemic drugs: Secondary | ICD-10-CM | POA: Diagnosis not present

## 2022-02-09 DIAGNOSIS — Z79890 Hormone replacement therapy (postmenopausal): Secondary | ICD-10-CM

## 2022-02-09 DIAGNOSIS — Z87891 Personal history of nicotine dependence: Secondary | ICD-10-CM | POA: Diagnosis not present

## 2022-02-09 DIAGNOSIS — R911 Solitary pulmonary nodule: Secondary | ICD-10-CM

## 2022-02-09 DIAGNOSIS — E039 Hypothyroidism, unspecified: Secondary | ICD-10-CM | POA: Diagnosis present

## 2022-02-09 DIAGNOSIS — E1151 Type 2 diabetes mellitus with diabetic peripheral angiopathy without gangrene: Secondary | ICD-10-CM | POA: Diagnosis present

## 2022-02-09 DIAGNOSIS — Z8249 Family history of ischemic heart disease and other diseases of the circulatory system: Secondary | ICD-10-CM

## 2022-02-09 DIAGNOSIS — Z951 Presence of aortocoronary bypass graft: Secondary | ICD-10-CM | POA: Diagnosis not present

## 2022-02-09 DIAGNOSIS — Z902 Acquired absence of lung [part of]: Principal | ICD-10-CM

## 2022-02-09 DIAGNOSIS — K219 Gastro-esophageal reflux disease without esophagitis: Secondary | ICD-10-CM | POA: Diagnosis present

## 2022-02-09 DIAGNOSIS — E7800 Pure hypercholesterolemia, unspecified: Secondary | ICD-10-CM | POA: Diagnosis present

## 2022-02-09 DIAGNOSIS — D62 Acute posthemorrhagic anemia: Secondary | ICD-10-CM | POA: Diagnosis not present

## 2022-02-09 DIAGNOSIS — Z20822 Contact with and (suspected) exposure to covid-19: Secondary | ICD-10-CM | POA: Diagnosis present

## 2022-02-09 DIAGNOSIS — I10 Essential (primary) hypertension: Secondary | ICD-10-CM

## 2022-02-09 DIAGNOSIS — Z79899 Other long term (current) drug therapy: Secondary | ICD-10-CM

## 2022-02-09 DIAGNOSIS — Z9081 Acquired absence of spleen: Secondary | ICD-10-CM

## 2022-02-09 DIAGNOSIS — Z96651 Presence of right artificial knee joint: Secondary | ICD-10-CM | POA: Diagnosis present

## 2022-02-09 DIAGNOSIS — C342 Malignant neoplasm of middle lobe, bronchus or lung: Secondary | ICD-10-CM | POA: Diagnosis not present

## 2022-02-09 DIAGNOSIS — E1142 Type 2 diabetes mellitus with diabetic polyneuropathy: Secondary | ICD-10-CM

## 2022-02-09 HISTORY — PX: VIDEO BRONCHOSCOPY: SHX5072

## 2022-02-09 LAB — GLUCOSE, CAPILLARY
Glucose-Capillary: 147 mg/dL — ABNORMAL HIGH (ref 70–99)
Glucose-Capillary: 122 mg/dL — ABNORMAL HIGH (ref 70–99)
Glucose-Capillary: 124 mg/dL — ABNORMAL HIGH (ref 70–99)
Glucose-Capillary: 140 mg/dL — ABNORMAL HIGH (ref 70–99)
Glucose-Capillary: 124 mg/dL — ABNORMAL HIGH (ref 70–99)
Glucose-Capillary: 123 mg/dL — ABNORMAL HIGH (ref 70–99)
Glucose-Capillary: 188 mg/dL — ABNORMAL HIGH (ref 70–99)

## 2022-02-09 SURGERY — LOBECTOMY, LUNG, ROBOT-ASSISTED, USING VATS
Anesthesia: General | Site: Chest | Laterality: Right

## 2022-02-09 MED ORDER — INSULIN ASPART 100 UNIT/ML IJ SOLN
0.0000 [IU] | INTRAMUSCULAR | Status: DC | PRN
  Administered 2022-02-09: 2 [IU] via SUBCUTANEOUS

## 2022-02-09 MED ORDER — METOPROLOL TARTRATE 25 MG PO TABS
25.0000 mg | ORAL_TABLET | Freq: Two times a day (BID) | ORAL | Status: DC
  Administered 2022-02-09 – 2022-02-11 (×4): 25 mg via ORAL
  Filled 2022-02-09 (×4): qty 1

## 2022-02-09 MED ORDER — LACTATED RINGERS IV SOLN: INTRAVENOUS | Status: DC | PRN

## 2022-02-09 MED ORDER — PREGABALIN 75 MG PO CAPS
150.0000 mg | ORAL_CAPSULE | Freq: Two times a day (BID) | ORAL | Status: DC
  Administered 2022-02-09 – 2022-02-11 (×4): 150 mg via ORAL
  Filled 2022-02-09 (×4): qty 2

## 2022-02-09 MED ORDER — ACETAMINOPHEN 160 MG/5ML PO SOLN
1000.0000 mg | Freq: Four times a day (QID) | ORAL | Status: DC
  Filled 2022-02-09: qty 40.6

## 2022-02-09 MED ORDER — DEXAMETHASONE SODIUM PHOSPHATE 10 MG/ML IJ SOLN
INTRAMUSCULAR | Status: DC | PRN
  Administered 2022-02-09: 8 mg via INTRAVENOUS

## 2022-02-09 MED ORDER — SUGAMMADEX SODIUM 200 MG/2ML IV SOLN
INTRAVENOUS | Status: DC | PRN
  Administered 2022-02-09: 200 mg via INTRAVENOUS

## 2022-02-09 MED ORDER — TRAMADOL HCL 50 MG PO TABS
50.0000 mg | ORAL_TABLET | Freq: Four times a day (QID) | ORAL | Status: DC | PRN
  Administered 2022-02-09 – 2022-02-11 (×3): 50 mg via ORAL
  Filled 2022-02-09 (×3): qty 1

## 2022-02-09 MED ORDER — PROPOFOL 10 MG/ML IV BOLUS
INTRAVENOUS | Status: AC
  Filled 2022-02-09: qty 20

## 2022-02-09 MED ORDER — DEXAMETHASONE SODIUM PHOSPHATE 10 MG/ML IJ SOLN
INTRAMUSCULAR | Status: AC
  Filled 2022-02-09: qty 1

## 2022-02-09 MED ORDER — INSULIN ASPART 100 UNIT/ML IJ SOLN
0.0000 [IU] | Freq: Four times a day (QID) | INTRAMUSCULAR | Status: DC
  Administered 2022-02-09 – 2022-02-10 (×2): 2 [IU] via SUBCUTANEOUS

## 2022-02-09 MED ORDER — DEXMEDETOMIDINE (PRECEDEX) IN NS 20 MCG/5ML (4 MCG/ML) IV SYRINGE
PREFILLED_SYRINGE | INTRAVENOUS | Status: DC | PRN
Start: 2022-02-09 — End: 2022-02-09
  Administered 2022-02-09: 20 ug via INTRAVENOUS

## 2022-02-09 MED ORDER — FENTANYL CITRATE (PF) 250 MCG/5ML IJ SOLN
INTRAMUSCULAR | Status: AC
  Filled 2022-02-09: qty 5

## 2022-02-09 MED ORDER — EZETIMIBE 10 MG PO TABS
10.0000 mg | ORAL_TABLET | Freq: Every day | ORAL | Status: DC
  Administered 2022-02-10 – 2022-02-11 (×2): 10 mg via ORAL
  Filled 2022-02-09 (×2): qty 1

## 2022-02-09 MED ORDER — BUPIVACAINE HCL (PF) 0.5 % IJ SOLN
INTRAMUSCULAR | Status: AC
  Filled 2022-02-09: qty 30

## 2022-02-09 MED ORDER — KETOROLAC TROMETHAMINE 15 MG/ML IJ SOLN
INTRAMUSCULAR | Status: AC
  Administered 2022-02-09: 15 mg
  Filled 2022-02-09: qty 1

## 2022-02-09 MED ORDER — OMEGA-3 FATTY ACIDS 1000 MG PO CAPS: 1.0000 g | ORAL_CAPSULE | Freq: Every day | ORAL | Status: DC

## 2022-02-09 MED ORDER — KETOROLAC TROMETHAMINE 15 MG/ML IJ SOLN
15.0000 mg | Freq: Four times a day (QID) | INTRAMUSCULAR | Status: DC
  Administered 2022-02-10: 15 mg via INTRAVENOUS
  Filled 2022-02-09 (×2): qty 1

## 2022-02-09 MED ORDER — FENTANYL CITRATE (PF) 100 MCG/2ML IJ SOLN
25.0000 ug | INTRAMUSCULAR | Status: DC | PRN
  Administered 2022-02-09: 50 ug via INTRAVENOUS

## 2022-02-09 MED ORDER — ASPIRIN EC 81 MG PO TBEC
81.0000 mg | DELAYED_RELEASE_TABLET | Freq: Every day | ORAL | Status: DC
  Administered 2022-02-10 – 2022-02-11 (×2): 81 mg via ORAL
  Filled 2022-02-09 (×2): qty 1

## 2022-02-09 MED ORDER — PHENYLEPHRINE 40 MCG/ML (10ML) SYRINGE FOR IV PUSH (FOR BLOOD PRESSURE SUPPORT)
PREFILLED_SYRINGE | INTRAVENOUS | Status: AC
  Filled 2022-02-09: qty 10

## 2022-02-09 MED ORDER — ADULT MULTIVITAMIN W/MINERALS CH
1.0000 | ORAL_TABLET | Freq: Every day | ORAL | Status: DC
  Administered 2022-02-10 – 2022-02-11 (×2): 1 via ORAL
  Filled 2022-02-09 (×2): qty 1

## 2022-02-09 MED ORDER — BUPIVACAINE LIPOSOME 1.3 % IJ SUSP
INTRAMUSCULAR | Status: AC
  Filled 2022-02-09: qty 20

## 2022-02-09 MED ORDER — FENTANYL CITRATE PF 50 MCG/ML IJ SOSY
25.0000 ug | PREFILLED_SYRINGE | INTRAMUSCULAR | Status: DC | PRN
  Administered 2022-02-09 – 2022-02-10 (×3): 50 ug via INTRAVENOUS
  Filled 2022-02-09 (×3): qty 1

## 2022-02-09 MED ORDER — ONDANSETRON HCL 4 MG/2ML IJ SOLN: 4.0000 mg | Freq: Once | INTRAMUSCULAR | Status: DC | PRN

## 2022-02-09 MED ORDER — MIDAZOLAM HCL 2 MG/2ML IJ SOLN
INTRAMUSCULAR | Status: AC
  Filled 2022-02-09: qty 2

## 2022-02-09 MED ORDER — ONDANSETRON HCL 4 MG/2ML IJ SOLN: 4.0000 mg | Freq: Four times a day (QID) | INTRAMUSCULAR | Status: DC | PRN

## 2022-02-09 MED ORDER — ROCURONIUM BROMIDE 10 MG/ML (PF) SYRINGE
PREFILLED_SYRINGE | INTRAVENOUS | Status: AC
  Filled 2022-02-09: qty 10

## 2022-02-09 MED ORDER — ONDANSETRON HCL 4 MG/2ML IJ SOLN
INTRAMUSCULAR | Status: AC
  Filled 2022-02-09: qty 2

## 2022-02-09 MED ORDER — PANTOPRAZOLE SODIUM 40 MG PO TBEC
40.0000 mg | DELAYED_RELEASE_TABLET | Freq: Every day | ORAL | Status: DC
Start: 2022-02-10 — End: 2022-02-11
  Administered 2022-02-10 – 2022-02-11 (×2): 40 mg via ORAL
  Filled 2022-02-09 (×2): qty 1

## 2022-02-09 MED ORDER — CEFAZOLIN SODIUM-DEXTROSE 2-4 GM/100ML-% IV SOLN
2.0000 g | Freq: Three times a day (TID) | INTRAVENOUS | Status: AC
  Administered 2022-02-09 – 2022-02-10 (×2): 2 g via INTRAVENOUS
  Filled 2022-02-09 (×2): qty 100

## 2022-02-09 MED ORDER — LISINOPRIL 2.5 MG PO TABS
2.5000 mg | ORAL_TABLET | Freq: Every day | ORAL | Status: DC
Start: 2022-02-10 — End: 2022-02-11
  Administered 2022-02-10 – 2022-02-11 (×2): 2.5 mg via ORAL
  Filled 2022-02-09 (×2): qty 1

## 2022-02-09 MED ORDER — PROPYLENE GLYCOL 0.6 % OP SOLN: 1.0000 [drp] | Freq: Three times a day (TID) | OPHTHALMIC | Status: DC | PRN

## 2022-02-09 MED ORDER — HEMOSTATIC AGENTS (NO CHARGE) OPTIME
TOPICAL | Status: DC | PRN
  Administered 2022-02-09 (×2): 1 via TOPICAL

## 2022-02-09 MED ORDER — PHENYLEPHRINE HCL-NACL 20-0.9 MG/250ML-% IV SOLN
INTRAVENOUS | Status: DC | PRN
  Administered 2022-02-09: 50 ug/min via INTRAVENOUS

## 2022-02-09 MED ORDER — LEVOTHYROXINE SODIUM 25 MCG PO TABS
25.0000 ug | ORAL_TABLET | Freq: Every day | ORAL | Status: DC
  Administered 2022-02-10 – 2022-02-11 (×2): 25 ug via ORAL
  Filled 2022-02-09 (×2): qty 1

## 2022-02-09 MED ORDER — SENNOSIDES-DOCUSATE SODIUM 8.6-50 MG PO TABS
1.0000 | ORAL_TABLET | Freq: Every day | ORAL | Status: DC
  Administered 2022-02-10: 1 via ORAL
  Filled 2022-02-09: qty 1

## 2022-02-09 MED ORDER — ACETAMINOPHEN 500 MG PO TABS
1000.0000 mg | ORAL_TABLET | Freq: Once | ORAL | Status: AC
Start: 2022-02-09 — End: 2022-02-09
  Administered 2022-02-09: 1000 mg via ORAL
  Filled 2022-02-09: qty 2

## 2022-02-09 MED ORDER — ALBUTEROL SULFATE (2.5 MG/3ML) 0.083% IN NEBU: 2.5000 mg | INHALATION_SOLUTION | RESPIRATORY_TRACT | Status: DC

## 2022-02-09 MED ORDER — ATORVASTATIN CALCIUM 40 MG PO TABS
40.0000 mg | ORAL_TABLET | Freq: Every day | ORAL | Status: DC
Start: 2022-02-09 — End: 2022-02-11
  Administered 2022-02-09 – 2022-02-10 (×2): 40 mg via ORAL
  Filled 2022-02-09 (×2): qty 1

## 2022-02-09 MED ORDER — ALBUTEROL SULFATE (2.5 MG/3ML) 0.083% IN NEBU
2.5000 mg | INHALATION_SOLUTION | Freq: Four times a day (QID) | RESPIRATORY_TRACT | Status: DC
Start: 2022-02-10 — End: 2022-02-10
  Administered 2022-02-10: 2.5 mg via RESPIRATORY_TRACT
  Filled 2022-02-09: qty 3

## 2022-02-09 MED ORDER — LIDOCAINE 2% (20 MG/ML) 5 ML SYRINGE
INTRAMUSCULAR | Status: DC | PRN
  Administered 2022-02-09: 100 mg via INTRAVENOUS

## 2022-02-09 MED ORDER — LIDOCAINE 2% (20 MG/ML) 5 ML SYRINGE
INTRAMUSCULAR | Status: AC
  Filled 2022-02-09: qty 5

## 2022-02-09 MED ORDER — CEFAZOLIN SODIUM-DEXTROSE 2-4 GM/100ML-% IV SOLN
INTRAVENOUS | Status: AC
  Filled 2022-02-09: qty 100

## 2022-02-09 MED ORDER — MIDAZOLAM HCL 5 MG/5ML IJ SOLN
INTRAMUSCULAR | Status: DC | PRN
  Administered 2022-02-09: 2 mg via INTRAVENOUS

## 2022-02-09 MED ORDER — PROPOFOL 10 MG/ML IV BOLUS
INTRAVENOUS | Status: DC | PRN
  Administered 2022-02-09: 150 mg via INTRAVENOUS

## 2022-02-09 MED ORDER — POLYVINYL ALCOHOL 1.4 % OP SOLN
1.0000 [drp] | Freq: Three times a day (TID) | OPHTHALMIC | Status: DC | PRN
  Administered 2022-02-09: 1 [drp] via OPHTHALMIC
  Filled 2022-02-09: qty 15

## 2022-02-09 MED ORDER — ROCURONIUM BROMIDE 10 MG/ML (PF) SYRINGE
PREFILLED_SYRINGE | INTRAVENOUS | Status: DC | PRN
Start: 2022-02-09 — End: 2022-02-09
  Administered 2022-02-09: 30 mg via INTRAVENOUS
  Administered 2022-02-09: 70 mg via INTRAVENOUS
  Administered 2022-02-09: 30 mg via INTRAVENOUS

## 2022-02-09 MED ORDER — FENTANYL CITRATE (PF) 100 MCG/2ML IJ SOLN
INTRAMUSCULAR | Status: AC
  Administered 2022-02-09: 25 ug
  Filled 2022-02-09: qty 2

## 2022-02-09 MED ORDER — PHENYLEPHRINE 40 MCG/ML (10ML) SYRINGE FOR IV PUSH (FOR BLOOD PRESSURE SUPPORT)
PREFILLED_SYRINGE | INTRAVENOUS | Status: DC | PRN
  Administered 2022-02-09: 80 ug via INTRAVENOUS

## 2022-02-09 MED ORDER — ONDANSETRON HCL 4 MG/2ML IJ SOLN
INTRAMUSCULAR | Status: DC | PRN
  Administered 2022-02-09: 4 mg via INTRAVENOUS

## 2022-02-09 MED ORDER — CHLORHEXIDINE GLUCONATE 0.12 % MT SOLN
OROMUCOSAL | Status: AC
  Administered 2022-02-09: 15 mL
  Filled 2022-02-09: qty 15

## 2022-02-09 MED ORDER — BISACODYL 5 MG PO TBEC
10.0000 mg | DELAYED_RELEASE_TABLET | Freq: Every day | ORAL | Status: DC
  Administered 2022-02-09 – 2022-02-10 (×2): 10 mg via ORAL
  Filled 2022-02-09 (×3): qty 2

## 2022-02-09 MED ORDER — CEFAZOLIN SODIUM-DEXTROSE 2-4 GM/100ML-% IV SOLN
2.0000 g | INTRAVENOUS | Status: AC
  Administered 2022-02-09: 2 g via INTRAVENOUS

## 2022-02-09 MED ORDER — SODIUM CHLORIDE FLUSH 0.9 % IV SOLN
INTRAVENOUS | Status: DC | PRN
  Administered 2022-02-09: 100 mL

## 2022-02-09 MED ORDER — ENOXAPARIN SODIUM 40 MG/0.4ML IJ SOSY
40.0000 mg | PREFILLED_SYRINGE | Freq: Every day | INTRAMUSCULAR | Status: DC
  Administered 2022-02-10 – 2022-02-11 (×2): 40 mg via SUBCUTANEOUS
  Filled 2022-02-09 (×2): qty 0.4

## 2022-02-09 MED ORDER — DULOXETINE HCL 30 MG PO CPEP
30.0000 mg | ORAL_CAPSULE | Freq: Two times a day (BID) | ORAL | Status: DC
  Administered 2022-02-09 – 2022-02-11 (×4): 30 mg via ORAL
  Filled 2022-02-09 (×4): qty 1

## 2022-02-09 MED ORDER — FENTANYL CITRATE (PF) 250 MCG/5ML IJ SOLN
INTRAMUSCULAR | Status: DC | PRN
  Administered 2022-02-09 (×3): 50 ug via INTRAVENOUS
  Administered 2022-02-09: 150 ug via INTRAVENOUS

## 2022-02-09 MED ORDER — SUCCINYLCHOLINE CHLORIDE 200 MG/10ML IV SOSY
PREFILLED_SYRINGE | INTRAVENOUS | Status: AC
  Filled 2022-02-09: qty 10

## 2022-02-09 MED ORDER — 0.9 % SODIUM CHLORIDE (POUR BTL) OPTIME
TOPICAL | Status: DC | PRN
  Administered 2022-02-09: 1000 mL

## 2022-02-09 MED ORDER — ACETAMINOPHEN 500 MG PO TABS
1000.0000 mg | ORAL_TABLET | Freq: Four times a day (QID) | ORAL | Status: DC
  Administered 2022-02-09 – 2022-02-11 (×5): 1000 mg via ORAL
  Filled 2022-02-09 (×5): qty 2

## 2022-02-09 SURGICAL SUPPLY — 115 items
ADH SKN CLS APL DERMABOND .7 (GAUZE/BANDAGES/DRESSINGS) ×2
APL PRP STRL LF DISP 70% ISPRP (MISCELLANEOUS) ×2
BAG TISS RTRVL C300 12X14 (MISCELLANEOUS) ×2
BLADE CLIPPER SURG (BLADE) ×3 IMPLANT
BLADE SURG 11 STRL SS (BLADE) ×3 IMPLANT
BNDG COHESIVE 6X5 TAN STRL LF (GAUZE/BANDAGES/DRESSINGS) ×3 IMPLANT
CANISTER SUCT 3000ML PPV (MISCELLANEOUS) ×6 IMPLANT
CANNULA REDUC XI 12-8 STAPL (CANNULA) ×6
CANNULA REDUCER 12-8 DVNC XI (CANNULA) ×4 IMPLANT
CATH THORACIC 28FR (CATHETERS) ×1 IMPLANT
CATH TROCAR 20FR (CATHETERS) IMPLANT
CHLORAPREP W/TINT 26 (MISCELLANEOUS) ×3 IMPLANT
CLIP VESOCCLUDE MED 6/CT (CLIP) IMPLANT
CNTNR URN SCR LID CUP LEK RST (MISCELLANEOUS) ×10 IMPLANT
CONN ST 1/4X3/8  BEN (MISCELLANEOUS) ×3
CONN ST 1/4X3/8 BEN (MISCELLANEOUS) IMPLANT
CONT SPEC 4OZ STRL OR WHT (MISCELLANEOUS) ×18
DEFOGGER SCOPE WARMER CLEARIFY (MISCELLANEOUS) ×3 IMPLANT
DERMABOND ADVANCED (GAUZE/BANDAGES/DRESSINGS) ×1
DERMABOND ADVANCED .7 DNX12 (GAUZE/BANDAGES/DRESSINGS) ×2 IMPLANT
DISSECTOR BLUNT TIP ENDO 5MM (MISCELLANEOUS) IMPLANT
DRAIN CHANNEL 28F RND 3/8 FF (WOUND CARE) IMPLANT
DRAPE ARM DVNC X/XI (DISPOSABLE) ×8 IMPLANT
DRAPE COLUMN DVNC XI (DISPOSABLE) ×2 IMPLANT
DRAPE CV SPLIT W-CLR ANES SCRN (DRAPES) ×3 IMPLANT
DRAPE DA VINCI XI ARM (DISPOSABLE) ×12
DRAPE DA VINCI XI COLUMN (DISPOSABLE) ×3
DRAPE ORTHO SPLIT 77X108 STRL (DRAPES) ×3
DRAPE SURG ORHT 6 SPLT 77X108 (DRAPES) ×2 IMPLANT
ELECT BLADE 6.5 EXT (BLADE) IMPLANT
ELECT REM PT RETURN 9FT ADLT (ELECTROSURGICAL) ×3
ELECTRODE REM PT RTRN 9FT ADLT (ELECTROSURGICAL) ×2 IMPLANT
GAUZE 4X4 16PLY ~~LOC~~+RFID DBL (SPONGE) ×2 IMPLANT
GAUZE KITTNER 4X5 RF (MISCELLANEOUS) ×3 IMPLANT
GAUZE KITTNER 4X8 (MISCELLANEOUS) ×3 IMPLANT
GAUZE SPONGE 4X4 12PLY STRL (GAUZE/BANDAGES/DRESSINGS) ×3 IMPLANT
GAUZE SPONGE 4X4 12PLY STRL LF (GAUZE/BANDAGES/DRESSINGS) ×1 IMPLANT
GLOVE SURG ENC MOIS LTX SZ7.5 (GLOVE) ×6 IMPLANT
GLOVE SURG POLYISO LF SZ8 (GLOVE) ×3 IMPLANT
GOWN STRL REUS W/ TWL LRG LVL3 (GOWN DISPOSABLE) ×4 IMPLANT
GOWN STRL REUS W/ TWL XL LVL3 (GOWN DISPOSABLE) ×6 IMPLANT
GOWN STRL REUS W/TWL 2XL LVL3 (GOWN DISPOSABLE) ×3 IMPLANT
GOWN STRL REUS W/TWL LRG LVL3 (GOWN DISPOSABLE) ×12
GOWN STRL REUS W/TWL XL LVL3 (GOWN DISPOSABLE) ×9
HEMOSTAT SURGICEL 2X14 (HEMOSTASIS) ×10 IMPLANT
IRRIGATION STRYKERFLOW (MISCELLANEOUS) IMPLANT
IRRIGATOR STRYKERFLOW (MISCELLANEOUS)
KIT BASIN OR (CUSTOM PROCEDURE TRAY) ×3 IMPLANT
KIT TURNOVER KIT B (KITS) ×3 IMPLANT
NEEDLE 22X1 1/2 (OR ONLY) (NEEDLE) ×3 IMPLANT
NS IRRIG 1000ML POUR BTL (IV SOLUTION) ×9 IMPLANT
PACK CHEST (CUSTOM PROCEDURE TRAY) ×3 IMPLANT
PAD ARMBOARD 7.5X6 YLW CONV (MISCELLANEOUS) ×15 IMPLANT
PORT ACCESS TROCAR AIRSEAL 12 (TROCAR) ×2 IMPLANT
PORT ACCESS TROCAR AIRSEAL 5M (TROCAR) ×1
POUCH ENDO CATCH II 15MM (MISCELLANEOUS) IMPLANT
RELOAD STAPLE 45 2.5 WHT DVNC (STAPLE) IMPLANT
RELOAD STAPLE 45 3.5 BLU DVNC (STAPLE) IMPLANT
RELOAD STAPLE 45 4.3 GRN DVNC (STAPLE) IMPLANT
RELOAD STAPLER 2.5X45 WHT DVNC (STAPLE) ×4 IMPLANT
RELOAD STAPLER 3.5X45 BLU DVNC (STAPLE) ×12 IMPLANT
RELOAD STAPLER 4.3X45 GRN DVNC (STAPLE) ×2 IMPLANT
RETRACTOR WOUND ALXS 19CM XSML (INSTRUMENTS) IMPLANT
RTRCTR WOUND ALEXIS 19CM XSML (INSTRUMENTS)
SCISSORS LAP 5X35 DISP (ENDOMECHANICALS) IMPLANT
SEAL CANN UNIV 5-8 DVNC XI (MISCELLANEOUS) ×4 IMPLANT
SEAL XI 5MM-8MM UNIVERSAL (MISCELLANEOUS) ×6
SEALANT PROGEL (MISCELLANEOUS) IMPLANT
SEALER LIGASURE MARYLAND 30 (ELECTROSURGICAL) IMPLANT
SET TRI-LUMEN FLTR TB AIRSEAL (TUBING) ×3 IMPLANT
SOLUTION ELECTROLUBE (MISCELLANEOUS) ×1 IMPLANT
SPONGE INTESTINAL PEANUT (DISPOSABLE) IMPLANT
SPONGE T-LAP 18X18 ~~LOC~~+RFID (SPONGE) ×12 IMPLANT
SPONGE TONSIL TAPE 1 RFD (DISPOSABLE) IMPLANT
STAPLER 45 SUREFORM CVD (STAPLE) ×3
STAPLER 45 SUREFORM CVD DVNC (STAPLE) IMPLANT
STAPLER CANNULA SEAL DVNC XI (STAPLE) ×4 IMPLANT
STAPLER CANNULA SEAL XI (STAPLE) ×6
STAPLER RELOAD 2.5X45 WHITE (STAPLE) ×6
STAPLER RELOAD 2.5X45 WHT DVNC (STAPLE) ×4
STAPLER RELOAD 3.5X45 BLU DVNC (STAPLE) ×12
STAPLER RELOAD 3.5X45 BLUE (STAPLE) ×18
STAPLER RELOAD 4.3X45 GREEN (STAPLE) ×3
STAPLER RELOAD 4.3X45 GRN DVNC (STAPLE) ×2
STOPCOCK 4 WAY LG BORE MALE ST (IV SETS) ×3 IMPLANT
SUT MNCRL AB 3-0 PS2 18 (SUTURE) IMPLANT
SUT MON AB 2-0 CT1 36 (SUTURE) IMPLANT
SUT PDS AB 1 CTX 36 (SUTURE) IMPLANT
SUT PROLENE 4 0 RB 1 (SUTURE)
SUT PROLENE 4-0 RB1 .5 CRCL 36 (SUTURE) IMPLANT
SUT SILK  1 MH (SUTURE) ×3
SUT SILK 1 MH (SUTURE) ×2 IMPLANT
SUT SILK 1 TIES 10X30 (SUTURE) IMPLANT
SUT SILK 2 0 SH (SUTURE) ×1 IMPLANT
SUT SILK 2 0SH CR/8 30 (SUTURE) IMPLANT
SUT VIC AB 1 CTX 36 (SUTURE)
SUT VIC AB 1 CTX36XBRD ANBCTR (SUTURE) IMPLANT
SUT VIC AB 2-0 CT1 27 (SUTURE) ×3
SUT VIC AB 2-0 CT1 TAPERPNT 27 (SUTURE) ×2 IMPLANT
SUT VIC AB 3-0 SH 27 (SUTURE) ×6
SUT VIC AB 3-0 SH 27X BRD (SUTURE) ×4 IMPLANT
SUT VICRYL 0 TIES 12 18 (SUTURE) ×3 IMPLANT
SUT VICRYL 0 UR6 27IN ABS (SUTURE) ×6 IMPLANT
SUT VICRYL 2 TP 1 (SUTURE) IMPLANT
SYR 10ML LL (SYRINGE) ×3 IMPLANT
SYR 20ML LL LF (SYRINGE) ×3 IMPLANT
SYR 50ML LL SCALE MARK (SYRINGE) ×3 IMPLANT
SYSTEM RETRIEVAL ANCHOR 12 (MISCELLANEOUS) ×1 IMPLANT
SYSTEM SAHARA CHEST DRAIN ATS (WOUND CARE) ×3 IMPLANT
TAPE CLOTH 4X10 WHT NS (GAUZE/BANDAGES/DRESSINGS) ×3 IMPLANT
TAPE CLOTH SURG 4X10 WHT LF (GAUZE/BANDAGES/DRESSINGS) ×1 IMPLANT
TIP APPLICATOR SPRAY EXTEND 16 (VASCULAR PRODUCTS) IMPLANT
TOWEL GREEN STERILE (TOWEL DISPOSABLE) ×3 IMPLANT
TRAY FOLEY MTR SLVR 16FR STAT (SET/KITS/TRAYS/PACK) ×3 IMPLANT
TUBING EXTENTION W/L.L. (IV SETS) ×3 IMPLANT

## 2022-02-09 NOTE — Brief Op Note (Signed)
02/09/2022 ? ?11:14 AM ? ?PATIENT:  Steven Hale.  66 y.o. male ? ?PRE-OPERATIVE DIAGNOSIS:  LUNG NODULE ? ?POST-OPERATIVE DIAGNOSIS:  LUNG NODULE ? ?PROCEDURE:  Procedure(s): ?XI ROBOTIC ASSISTED THORASCOPY-RIGHT MIDDLE LOBECTOMY (Right) ?VIDEO BRONCHOSCOPY (N/A) ?Lymph node sampling ?SURGEON:  Surgeon(s) and Role: ?   * Lajuana Matte, MD - Primary ? ?PHYSICIAN ASSISTANT: Marites Nath pa-c ? ?ANESTHESIA:   general ? ?EBL:  150 ml ? ?BLOOD ADMINISTERED:none ? ?DRAINS: (34f) Blake drain(s) in the right hemithorax   ? ?LOCAL MEDICATIONS USED:  OTHER Marcaine and Exparel ? ?SPECIMEN: Right middle lobe and lymph node samples ? ?DISPOSITION OF SPECIMEN:  PATHOLOGY ? ?COUNTS:  YES ? ?TOURNIQUET:  * No tourniquets in log * ? ?DICTATION: .Dragon Dictation ? ?PLAN OF CARE: Admit to inpatient  ? ?PATIENT DISPOSITION:  PACU - hemodynamically stable. ?  ?Delay start of Pharmacological VTE agent (>24hrs) due to surgical blood loss or risk of bleeding: yes ? ?Complications: No known ? ?

## 2022-02-09 NOTE — Progress Notes (Signed)
Unable to obtain ABG in PAT. Lab stated he was stuck four times unsuccessfully and did not tolerate it well. Clear site to be used during surgery. Surgeon called and questioned need for ABG on DOS and stated it would not be necessary today and order could be cancelled.  ?

## 2022-02-09 NOTE — Interval H&P Note (Signed)
History and Physical Interval Note: ? ?02/09/2022 ?7:13 AM ? ?Steven Hale.  has presented today for surgery, with the diagnosis of LUNG NODULE.  The various methods of treatment have been discussed with the patient and family. After consideration of risks, benefits and other options for treatment, the patient has consented to  Procedure(s): ?XI ROBOTIC ASSISTED THORASCOPY-RIGHT MIDDLE LOBECTOMY (Right) ?VIDEO BRONCHOSCOPY (N/A) as a surgical intervention.  The patient's history has been reviewed, patient examined, no change in status, stable for surgery.  I have reviewed the patient's chart and labs.  Questions were answered to the patient's satisfaction.   ? ? ?Steven Hale ? ? ?

## 2022-02-09 NOTE — Transfer of Care (Signed)
Immediate Anesthesia Transfer of Care Note ? ?Patient: Steven Hale. ? ?Procedure(s) Performed: XI ROBOTIC ASSISTED THORASCOPY-RIGHT MIDDLE LOBECTOMY (Right: Chest) ?VIDEO BRONCHOSCOPY ? ?Patient Location: PACU ? ?Anesthesia Type:General ? ?Level of Consciousness: drowsy and patient cooperative ? ?Airway & Oxygen Therapy: Patient Spontanous Breathing and Patient connected to face mask oxygen ? ?Post-op Assessment: Report given to RN and Post -op Vital signs reviewed and stable ? ?Post vital signs: Reviewed and stable ? ?Last Vitals:  ?Vitals Value Taken Time  ?BP 142/78 02/09/22 1115  ?Temp    ?Pulse 65 02/09/22 1117  ?Resp 10 02/09/22 1117  ?SpO2 100 % 02/09/22 1117  ?Vitals shown include unvalidated device data. ? ?Last Pain:  ?Vitals:  ? 02/09/22 0613  ?TempSrc:   ?PainSc: 0-No pain  ?   ? ?Patients Stated Pain Goal: 0 (02/09/22 4718) ? ?Complications: No notable events documented. ?

## 2022-02-09 NOTE — Op Note (Signed)
? ?   ?  [address] ?      [place] [address] ?            [phone]      ? ? ?02/09/2022 ? ?Patient:  Steven Hale. ?Pre-Op Dx: Right Middle Lobe Carcinoid    ?Post-op Dx:  same ?Procedure: ?- Robotic assisted right video thoracoscopy ?- right middle lobectomy ?- Mediastinal lymph node sampling ?- Intercostal nerve block ? ?Surgeon and Role:   ?   * Lajuana Matte, MD - Primary ? ?Assistant: Evonnie Pat, PA-C  ?An experienced assistant was required given the complexity of this surgery and the standard of surgical care. The assistant was needed for exposure, dissection, suctioning, retraction of delicate tissues and sutures, instrument exchange and for overall help during this procedure.   ? ?Anesthesia  general ?EBL:  22ml ?Blood Administration: none ?Specimen:  hilar nodes.  Level 4 and 7 lymph nodes.  Right middle lobe ? ?Drains: 4 F argyle chest tube in right chest ?Counts: correct ? ? ?Indications: ?66 yo male with enlarging 2.3cm RML nodule.  Biopsies are consistent with carcinoid tumor.  He will require pulmonary function testing and a stress test.  We discussed the risks and benefits of a bronchoscopy with a robotic assisted right middle lobectomy.  He is agreeable to proceed. ? ?Findings: ?Medial adhesions.  Normal anatomy ? ?Operative Technique: ?After the risks, benefits and alternatives were thoroughly discussed, the patient was brought to the operative theatre.  Anesthesia was induced, and the patient was then placed in a left lateral decubitus position and was prepped and draped in normal sterile fashion.  An appropriate surgical pause was performed, and pre-operative antibiotics were dosed accordingly. ? ?We began by placing our 4 robotic ports in the the 7th intercostal space targeting the hilum of the lung.  A 87mm assistant port was placed in the 9th intercostal space in the anterior axillary line.  The robot was then docked and all instruments were passed under direct  visualization.    The lung was then retracted superiorly, and the inferior pulmonary ligament was divided.  The hilum was mobilized anteriorly and posteriorly.  We identified the middle lobe vein, and after careful isolation, it was divided with a vascular stapler.  We next moved to the  fissure.  The artery was then divided with a vascular load stapler.  The bronchus to the right middle was then isolated.  After a test clamp, with good ventilation of the upper and lower, the bronchus was then divided.  The fissure was completed, and the specimen was passed into an endocatch bag.  It was removed from the anterior access site.   ? ?Lymph nodes were then sampled at levels 4 and.  The chest was irrigated, and an air leak test was performed.  An intercostal nerve block was performed under direct visualization.  A 28 F chest tube was then placed, and we watch the remaining lobes re-expand.  The skin and soft tissue were closed with absorbable suture   ? ?The patient tolerated the procedure without any immediate complications, and was transferred to the PACU in stable condition. ? ?Lajuana Matte ? ?

## 2022-02-09 NOTE — Hospital Course (Addendum)
History of Present Illness:    ?Steven Hale. 66 y.o. 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  He is a former smoker who quit in 2009, but has been followed at the [facility] for a 2 cm pulmonary nodule.  He recently underwent a PET/CT which showed minimal activity as well as a bronchoscopy which confirmed the diagnosis. ?  ?In regards to his symptoms, he does endorse some shortness of breath when climbing multiple flights of stairs.  He has undergone coronary revascularization back in 2012.  He denies any neurologic symptoms or weight changes. ? ?The patient and all relevant studies were reviewed by Dr. Kipp Brood who recommended proceeding with right middle lobe lobectomy.  He was admitted electively on 02/09/2022 for the procedure. ? ?Hospital course: ? ?The patient was admitted and on 02/09/2022 taken the operating room at which time he underwent a robotic assisted right middle lobe lobectomy.  He tolerated the procedure well was taken to the postanesthesia care unit in stable condition. ? ?Postoperative hospital course: ? ?The patient has done well.  There was minimal chest tube drainage and no evidence of air leak, therefore he chest tube was removed on postoperative day #1.  Oxygen has been weaned and he is maintaining good saturations.  He does have an expected acute blood loss anemia which is minor and being observed clinically.  Blood sugars have been under control using standard measures and he is home medication regimen has been resumed. ?  ? ? ?

## 2022-02-09 NOTE — Anesthesia Procedure Notes (Signed)
Procedure Name: Intubation ?Date/Time: 02/09/2022 7:49 AM ?Performed by: Lowella Dell, CRNA ?Pre-anesthesia Checklist: Patient identified, Emergency Drugs available, Suction available and Patient being monitored ?Patient Re-evaluated:Patient Re-evaluated prior to induction ?Oxygen Delivery Method: Circle System Utilized ?Preoxygenation: Pre-oxygenation with 100% oxygen ?Induction Type: IV induction ?Ventilation: Mask ventilation without difficulty and Oral airway inserted - appropriate to patient size ?Laryngoscope Size: Mac and 4 ?Grade View: Grade II ?Endobronchial tube: Left, Double lumen EBT, EBT position confirmed by fiberoptic bronchoscope and EBT position confirmed by auscultation and 39 Fr ?Tube size: 7.5 mm ?Number of attempts: 1 ?Airway Equipment and Method: Stylet ?Placement Confirmation: ETT inserted through vocal cords under direct vision, positive ETCO2 and breath sounds checked- equal and bilateral ?Secured at: 31 cm ?Tube secured with: Tape ?Dental Injury: Teeth and Oropharynx as per pre-operative assessment  ? ? ? ? ?

## 2022-02-09 NOTE — Anesthesia Postprocedure Evaluation (Signed)
Anesthesia Post Note ? ?Patient: Steven Hale. ? ?Procedure(s) Performed: XI ROBOTIC ASSISTED THORASCOPY-RIGHT MIDDLE LOBECTOMY (Right: Chest) ?VIDEO BRONCHOSCOPY ? ?  ? ?Patient location during evaluation: PACU ?Anesthesia Type: General ?Level of consciousness: awake and alert ?Pain management: pain level controlled ?Vital Signs Assessment: post-procedure vital signs reviewed and stable ?Respiratory status: spontaneous breathing, nonlabored ventilation, respiratory function stable and patient connected to nasal cannula oxygen ?Cardiovascular status: blood pressure returned to baseline and stable ?Postop Assessment: no apparent nausea or vomiting ?Anesthetic complications: no ? ? ?No notable events documented. ? ?Last Vitals:  ?Vitals:  ? 02/09/22 1615 02/09/22 1655  ?BP: 120/75   ?Pulse: (!) 59   ?Resp: 16   ?Temp: 36.4 ?C (!) 36.3 ?C  ?SpO2: 94%   ?  ?Last Pain:  ?Vitals:  ? 02/09/22 1655  ?TempSrc: Axillary  ?PainSc: 0-No pain  ? ? ?  ?  ?  ?  ?  ?  ? ?Santa Lighter ? ? ? ? ?

## 2022-02-10 ENCOUNTER — Inpatient Hospital Stay (HOSPITAL_COMMUNITY): Payer: No Typology Code available for payment source

## 2022-02-10 ENCOUNTER — Encounter (HOSPITAL_COMMUNITY): Payer: Self-pay | Admitting: Thoracic Surgery (Cardiothoracic Vascular Surgery)

## 2022-02-10 LAB — BASIC METABOLIC PANEL
Anion gap: 8 (ref 5–15)
BUN: 13 mg/dL (ref 8–23)
CO2: 26 mmol/L (ref 22–32)
Calcium: 9.3 mg/dL (ref 8.9–10.3)
Chloride: 101 mmol/L (ref 98–111)
Creatinine, Ser: 0.95 mg/dL (ref 0.61–1.24)
GFR, Estimated: >60 mL/min (ref >60)
Glucose, Bld: 128 mg/dL — ABNORMAL HIGH (ref 70–99)
Potassium: 4.6 mmol/L (ref 3.5–5.1)
Sodium: 135 mmol/L (ref 135–145)

## 2022-02-10 LAB — CBC
HCT: 34.7 % — ABNORMAL LOW (ref 39.0–52.0)
Hemoglobin: 11.4 g/dL — ABNORMAL LOW (ref 13.0–17.0)
MCH: 29.7 pg (ref 26.0–34.0)
MCHC: 32.9 g/dL (ref 30.0–36.0)
MCV: 90.4 fL (ref 80.0–100.0)
Platelets: 293 10*3/uL (ref 150–400)
RBC: 3.84 MIL/uL — ABNORMAL LOW (ref 4.22–5.81)
RDW: 14.9 % (ref 11.5–15.5)
WBC: 10.5 10*3/uL (ref 4.0–10.5)
nRBC: 0 % (ref 0.0–0.2)

## 2022-02-10 LAB — GLUCOSE, CAPILLARY
Glucose-Capillary: 136 mg/dL — ABNORMAL HIGH (ref 70–99)
Glucose-Capillary: 119 mg/dL — ABNORMAL HIGH (ref 70–99)
Glucose-Capillary: 97 mg/dL (ref 70–99)

## 2022-02-10 MED ORDER — KETOROLAC TROMETHAMINE 15 MG/ML IJ SOLN
30.0000 mg | Freq: Four times a day (QID) | INTRAMUSCULAR | Status: DC
  Administered 2022-02-10 – 2022-02-11 (×3): 30 mg via INTRAVENOUS
  Filled 2022-02-10 (×4): qty 2

## 2022-02-10 MED ORDER — LUNG SURGERY BOOK
Freq: Once | Status: AC
  Filled 2022-02-10: qty 1

## 2022-02-10 MED ORDER — ALBUTEROL SULFATE (2.5 MG/3ML) 0.083% IN NEBU: 2.5000 mg | INHALATION_SOLUTION | RESPIRATORY_TRACT | Status: DC | PRN

## 2022-02-10 MED ORDER — EMPAGLIFLOZIN 25 MG PO TABS
25.0000 mg | ORAL_TABLET | Freq: Every day | ORAL | Status: DC
  Administered 2022-02-10 – 2022-02-11 (×2): 25 mg via ORAL
  Filled 2022-02-10 (×2): qty 1

## 2022-02-10 MED ORDER — METFORMIN HCL 500 MG PO TABS
500.0000 mg | ORAL_TABLET | Freq: Two times a day (BID) | ORAL | Status: DC
Start: 2022-02-10 — End: 2022-02-11
  Administered 2022-02-10 – 2022-02-11 (×3): 500 mg via ORAL
  Filled 2022-02-10 (×3): qty 1

## 2022-02-10 NOTE — Progress Notes (Signed)
Mobility Specialist Progress Note  ? ? 02/10/22 1113  ?Mobility  ?Activity Ambulated independently in hallway  ?Level of Assistance Standby assist, set-up cues, supervision of patient - no hands on  ?Assistive Device None  ?Distance Ambulated (ft) 420 ft  ?Activity Response Tolerated well  ?$Mobility charge 1 Mobility  ? ?During Mobility: 95% SpO2 ?Post-Mobility: 68 HR, 93% SpO2 ? ?Pt received in bed and agreeable. No complaints. Returned to chair with call bell in reach.  ? ?Hildred Alamin ?Mobility Specialist  ?  ?

## 2022-02-10 NOTE — Progress Notes (Signed)
? ?   ?[address] ?      [place] [address] ?            [phone]   ? ?  1 Day Post-Op Procedure(s) (LRB): ?XI ROBOTIC ASSISTED THORASCOPY-RIGHT MIDDLE LOBECTOMY (Right) ?VIDEO BRONCHOSCOPY (N/A) ?Subjective: ?Mod pain at times ? ?Objective: ?Vital signs in last 24 hours: ?Temp:  [96.7 ?F (35.9 ?C)-97.7 ?F (36.5 ?C)] 97.7 ?F (36.5 ?C) (03/27 2333) ?Pulse Rate:  [59-77] 70 (03/28 0000) ?Cardiac Rhythm: Heart block (03/28 0716) ?Resp:  [10-22] 10 (03/28 0000) ?BP: (104-142)/(60-81) 123/73 (03/28 0000) ?SpO2:  [94 %-99 %] 95 % (03/28 0000) ? ?Hemodynamic parameters for last 24 hours: ?  ? ?Intake/Output from previous day: ?03/27 0701 - 03/28 0700 ?In: 3180 [P.O.:480; I.V.:2400; IV Piggyback:300] ?Out: 2600 [Urine:2450; Blood:50; Chest Tube:100] ?Intake/Output this shift: ?No intake/output data recorded. ? ?General appearance: alert, cooperative, and no distress ?Heart: regular rate and rhythm ?Lungs: mildly dimin in bases ?Abdomen: benign ?Extremities: no edema or calf tenderness ?Wound: incis healing ok ? ?Lab Results: ?Recent Labs  ?  02/10/22 ?5176  ?WBC 10.5  ?HGB 11.4*  ?HCT 34.7*  ?PLT 293  ? ?BMET:  ?Recent Labs  ?  02/10/22 ?1607  ?NA 135  ?K 4.6  ?CL 101  ?CO2 26  ?GLUCOSE 128*  ?BUN 13  ?CREATININE 0.95  ?CALCIUM 9.3  ?  ?PT/INR: No results for input(s): LABPROT, INR in the last 72 hours. ?ABG ?   ?Component Value Date/Time  ? PHART 7.365 09/18/2010 2120  ? HCO3 24.9 (H) 09/18/2010 2120  ? TCO2 26 09/19/2010 1716  ? ACIDBASEDEF 1.0 09/18/2010 1739  ? O2SAT 99.0 09/18/2010 2120  ? ?CBG (last 3)  ?Recent Labs  ?  02/09/22 ?1731 02/09/22 ?2132 02/10/22 ?0608  ?GLUCAP 123* 188* 136*  ? ? ?Meds ?Scheduled Meds: ? acetaminophen  1,000 mg Oral Q6H  ? Or  ? acetaminophen (TYLENOL) oral liquid 160 mg/5 mL  1,000 mg Oral Q6H  ? albuterol  2.5 mg Nebulization QID  ? aspirin EC  81 mg Oral Daily  ? atorvastatin  40 mg Oral QHS  ? bisacodyl  10 mg Oral Daily  ? DULoxetine  30 mg Oral BID  ?  enoxaparin (LOVENOX) injection  40 mg Subcutaneous Daily  ? ezetimibe  10 mg Oral Daily  ? insulin aspart  0-24 Units Subcutaneous Q6H  ? ketorolac  15 mg Intravenous Q6H  ? levothyroxine  25 mcg Oral Q0600  ? lisinopril  2.5 mg Oral Daily  ? metoprolol tartrate  25 mg Oral BID  ? multivitamin with minerals  1 tablet Oral Daily  ? pantoprazole  40 mg Oral Daily  ? pregabalin  150 mg Oral BID  ? senna-docusate  1 tablet Oral QHS  ? ?Continuous Infusions: ?PRN Meds:.fentaNYL (SUBLIMAZE) injection, ondansetron (ZOFRAN) IV, polyvinyl alcohol, traMADol ? ?Xrays ?DG Chest Port 1 View ? ?Result Date: 02/09/2022 ?CLINICAL DATA:  Right middle lobectomy, postop in PACU EXAM: PORTABLE CHEST 1 VIEW COMPARISON:  Chest radiograph 02/05/2022 FINDINGS: Postsurgical changes reflecting prior median sternotomy are again noted. There is a right apical chest tube in place. The cardiomediastinal silhouette is stable. The patient is status post right middle lobectomy. Minimal opacities adjacent to the chain sutures projecting over the right midlung may reflect postsurgical atelectasis or small amount of hemorrhage. Otherwise, the remaining right lung is clear. There is no significant pleural effusion. There is no appreciable pneumothorax. The left lung is clear. There is no  left effusion or pneumothorax There is no acute osseous abnormality. IMPRESSION: Postsurgical changes reflecting right middle lobectomy with right apical chest tube in place. No appreciable pneumothorax. Minimal opacities projecting along the sutures may reflect atelectasis or a small amount of hemorrhage. Electronically Signed   By: Valetta Mole M.D.   On: 02/09/2022 11:36   ? ?Assessment/Plan: ?S/P Procedure(s) (LRB): ?XI ROBOTIC ASSISTED THORASCOPY-RIGHT MIDDLE LOBECTOMY (Right) ?VIDEO BRONCHOSCOPY (N/A) ?POD#1 ? ?1 afeb, VSS ?2 sats ok on 2 liters [place] ?3 CT 100 cc recorded, no air leak,  prob remove today ?4 excellent UOP, normal renal fxn ?5 CXR some right sided  atx ?6 expected ABLA, minor, cont to observe ?7 BS adeq controlled- resume home meds/SSI ?8 routine pulm hygiene and progression, poss home in am ? ? LOS: 1 day  ? ? ?John Giovanni ?02/10/2022 ?  ?

## 2022-02-10 NOTE — Plan of Care (Signed)
?  Problem: Education: ?Goal: Knowledge of disease or condition will improve ?Outcome: Progressing ?Goal: Knowledge of the prescribed therapeutic regimen will improve ?Outcome: Progressing ?  ?Problem: Activity: ?Goal: Risk for activity intolerance will decrease ?Outcome: Progressing ?  ?Problem: Cardiac: ?Goal: Will achieve and/or maintain hemodynamic stability ?Outcome: Progressing ?  ?Problem: Clinical Measurements: ?Goal: Postoperative complications will be avoided or minimized ?Outcome: Progressing ?  ?

## 2022-02-10 NOTE — TOC Transition Note (Signed)
Transition of Care (TOC) - CM/SW Discharge Note ? ? ?Patient Details  ?Name: Steven Hale. ?MRN: [id number] ?Date of Birth: 1956-03-19 ? ?Transition of Care (TOC) CM/SW Contact:  ?Angelita Ingles, RN ?Phone Number:[phone] ? ?02/10/2022, 3:21 PM ? ? ?Clinical Narrative:    ? ?Transition of Care (TOC) Screening Note ? ? ?Patient Details  ?Name: Steven Hale. ?Date of Birth: Oct 09, 1956 ? ? ?Transition of Care (TOC) CM/SW Contact:    ?Angelita Ingles, RN ?Phone Number: ?02/10/2022, 3:22 PM ? ? ? ?Transition of Care Department [facility]) has reviewed patient and no TOC needs have been identified at this time. We will continue to monitor patient advancement through interdisciplinary progression rounds. If new patient transition needs arise, please place a TOC consult. ? ? ? ? ?  ?  ? ? ?Patient Goals and CMS Choice ?  ?  ?  ? ?Discharge Placement ?  ?           ?  ?  ?  ?  ? ?Discharge Plan and Services ?  ?  ?           ?  ?  ?  ?  ?  ?  ?  ?  ?  ?  ? ?Social Determinants of Health (SDOH) Interventions ?  ? ? ?Readmission Risk Interventions ? ?  03/04/2021  ?  3:50 PM  ?Readmission Risk Prevention Plan  ?Post Dischage Appt Complete  ?Medication Screening Complete  ?Transportation Screening Complete  ? ? ? ? ? ?

## 2022-02-10 NOTE — TOC Progression Note (Signed)
Transition of Care (TOC) - Progression Note  ? ? ?Patient Details  ?Name: Steven Hale. ?MRN: [id number] ?Date of Birth: 1956-11-06 ? ?Transition of Care (TOC) CM/SW Contact  ?Angelita Ingles, RN ?Phone [phone] ? ?02/10/2022, 3:28 PM ? ?Clinical Narrative:    ? ?Transition of Care (TOC) Screening Note ? ? ?Patient Details  ?Name: Steven Hale. ?Date of Birth: 23-Feb-1956 ? ? ?Transition of Care (TOC) CM/SW Contact:    ?Angelita Ingles, RN ?Phone Number: ?02/10/2022, 3:29 PM ? ? ? ?Transition of Care Department [facility]) has reviewed patient and no TOC needs have been identified at this time. We will continue to monitor patient advancement through interdisciplinary progression rounds. If new patient transition needs arise, please place a TOC consult. ? ? ? ? ?  ?  ? ?Expected Discharge Plan and Services ?  ?  ?  ?  ?  ?                ?  ?  ?  ?  ?  ?  ?  ?  ?  ?  ? ? ?Social Determinants of Health (SDOH) Interventions ?  ? ?Readmission Risk Interventions ? ?  03/04/2021  ?  3:50 PM  ?Readmission Risk Prevention Plan  ?Post Dischage Appt Complete  ?Medication Screening Complete  ?Transportation Screening Complete  ? ? ?

## 2022-02-11 ENCOUNTER — Other Ambulatory Visit (HOSPITAL_COMMUNITY): Payer: Self-pay

## 2022-02-11 ENCOUNTER — Inpatient Hospital Stay (HOSPITAL_COMMUNITY): Payer: No Typology Code available for payment source

## 2022-02-11 LAB — COMPREHENSIVE METABOLIC PANEL
ALT: 20 U/L (ref 0–44)
AST: 40 U/L (ref 15–41)
Albumin: 3.8 g/dL (ref 3.5–5.0)
Alkaline Phosphatase: 63 U/L (ref 38–126)
Anion gap: 8 (ref 5–15)
BUN: 27 mg/dL — ABNORMAL HIGH (ref 8–23)
CO2: 25 mmol/L (ref 22–32)
Calcium: 9.4 mg/dL (ref 8.9–10.3)
Chloride: 104 mmol/L (ref 98–111)
Creatinine, Ser: 1.02 mg/dL (ref 0.61–1.24)
GFR, Estimated: >60 mL/min (ref >60)
Glucose, Bld: 122 mg/dL — ABNORMAL HIGH (ref 70–99)
Potassium: 4.2 mmol/L (ref 3.5–5.1)
Sodium: 137 mmol/L (ref 135–145)
Total Bilirubin: 0.8 mg/dL (ref 0.3–1.2)
Total Protein: 6.5 g/dL (ref 6.5–8.1)

## 2022-02-11 LAB — CBC
HCT: 35.7 % — ABNORMAL LOW (ref 39.0–52.0)
Hemoglobin: 11.5 g/dL — ABNORMAL LOW (ref 13.0–17.0)
MCH: 29.3 pg (ref 26.0–34.0)
MCHC: 32.2 g/dL (ref 30.0–36.0)
MCV: 90.8 fL (ref 80.0–100.0)
Platelets: 276 10*3/uL (ref 150–400)
RBC: 3.93 MIL/uL — ABNORMAL LOW (ref 4.22–5.81)
RDW: 14.9 % (ref 11.5–15.5)
WBC: 6.7 10*3/uL (ref 4.0–10.5)
nRBC: 0 % (ref 0.0–0.2)

## 2022-02-11 LAB — GLUCOSE, CAPILLARY
Glucose-Capillary: 107 mg/dL — ABNORMAL HIGH (ref 70–99)
Glucose-Capillary: 113 mg/dL — ABNORMAL HIGH (ref 70–99)

## 2022-02-11 MED ORDER — PREGABALIN 150 MG PO CAPS: 300.0000 mg | ORAL_CAPSULE | Freq: Two times a day (BID) | ORAL | Status: DC

## 2022-02-11 MED ORDER — OXYCODONE HCL 5 MG PO TABS
5.0000 mg | ORAL_TABLET | Freq: Four times a day (QID) | ORAL | 0 refills | Status: AC | PRN
  Filled 2022-02-11: qty 28, 7d supply, fill #0

## 2022-02-11 MED ORDER — ATORVASTATIN CALCIUM 40 MG PO TABS: 40.0000 mg | ORAL_TABLET | Freq: Every day | ORAL | Status: DC

## 2022-02-11 MED ORDER — METFORMIN HCL 500 MG PO TABS: 500.0000 mg | ORAL_TABLET | Freq: Two times a day (BID) | ORAL | Status: DC

## 2022-02-11 NOTE — Progress Notes (Signed)
X-ray result with  small right apical pneumothorax, relayed to TCTS PA with no order. ?

## 2022-02-11 NOTE — Progress Notes (Signed)
? ?   ?[address] ?      [place] [address] ?            [phone]   ? ?  2 Days Post-Op Procedure(s) (LRB): ?XI ROBOTIC ASSISTED THORASCOPY-RIGHT MIDDLE LOBECTOMY (Right) ?VIDEO BRONCHOSCOPY (N/A) ?Subjective: ?Feels well , pain is well controlled ? ?Objective: ?Vital signs in last 24 hours: ?Temp:  [97.8 ?F (36.6 ?C)-98.3 ?F (36.8 ?C)] 98.3 ?F (36.8 ?C) (03/29 7619) ?Pulse Rate:  [61-92] 61 (03/29 5093) ?Cardiac Rhythm: Normal sinus rhythm (03/29 0730) ?Resp:  [11-19] 11 (03/29 2671) ?BP: (108-142)/(69-84) 129/74 (03/29 2458) ?SpO2:  [91 %-94 %] 92 % (03/29 0998) ? ?Hemodynamic parameters for last 24 hours: ?  ? ?Intake/Output from previous day: ?03/28 0701 - 03/29 0700 ?In: 690 [P.O.:690] ?Out: 3750 [[id number]] ?Intake/Output this shift: ?No intake/output data recorded. ? ?General appearance: alert, cooperative, and no distress ?Heart: regular rate and rhythm ?Lungs: clear to auscultation bilaterally ?Abdomen: benign ?Extremities: no edema or calf tenderness ?Wound: incis healing well ? ?Lab Results: ?Recent Labs  ?  02/10/22 ?7673 02/11/22 ?0104  ?WBC 10.5 6.7  ?HGB 11.4* 11.5*  ?HCT 34.7* 35.7*  ?PLT 293 276  ? ?BMET:  ?Recent Labs  ?  02/10/22 ?4193 02/11/22 ?0104  ?NA 135 137  ?K 4.6 4.2  ?CL 101 104  ?CO2 26 25  ?GLUCOSE 128* 122*  ?BUN 13 27*  ?CREATININE 0.95 1.02  ?CALCIUM 9.3 9.4  ?  ?PT/INR: No results for input(s): LABPROT, INR in the last 72 hours. ?ABG ?   ?Component Value Date/Time  ? PHART 7.365 09/18/2010 2120  ? HCO3 24.9 (H) 09/18/2010 2120  ? TCO2 26 09/19/2010 1716  ? ACIDBASEDEF 1.0 09/18/2010 1739  ? O2SAT 99.0 09/18/2010 2120  ? ?CBG (last 3)  ?Recent Labs  ?  02/10/22 ?1646 02/11/22 ?0006 02/11/22 ?7902  ?GLUCAP 97 113* 107*  ? ? ?Meds ?Scheduled Meds: ? acetaminophen  1,000 mg Oral Q6H  ? Or  ? acetaminophen (TYLENOL) oral liquid 160 mg/5 mL  1,000 mg Oral Q6H  ? aspirin EC  81 mg Oral Daily  ? atorvastatin  40 mg Oral QHS  ? bisacodyl  10 mg Oral Daily  ?  DULoxetine  30 mg Oral BID  ? empagliflozin  25 mg Oral Daily  ? enoxaparin (LOVENOX) injection  40 mg Subcutaneous Daily  ? ezetimibe  10 mg Oral Daily  ? insulin aspart  0-24 Units Subcutaneous Q6H  ? ketorolac  30 mg Intravenous Q6H  ? levothyroxine  25 mcg Oral Q0600  ? lisinopril  2.5 mg Oral Daily  ? metFORMIN  500 mg Oral BID WC  ? metoprolol tartrate  25 mg Oral BID  ? multivitamin with minerals  1 tablet Oral Daily  ? pantoprazole  40 mg Oral Daily  ? pregabalin  150 mg Oral BID  ? senna-docusate  1 tablet Oral QHS  ? ?Continuous Infusions: ?PRN Meds:.albuterol, fentaNYL (SUBLIMAZE) injection, ondansetron (ZOFRAN) IV, polyvinyl alcohol, traMADol ? ?Xrays ?DG Chest Port 1 View ? ?Result Date: 02/10/2022 ?CLINICAL DATA:  Status post right middle lobectomy yesterday. EXAM: PORTABLE CHEST 1 VIEW COMPARISON:  Yesterday FINDINGS: Right-sided chest tube is unchanged in position. Prior median sternotomy. Borderline cardiomegaly. Atherosclerosis in the transverse aorta. Right hemidiaphragm elevation secondary to volume loss in the right hemithorax. No pneumothorax. Increased right mid lung airspace disease is likely atelectasis, just cephalad surgical sutures. There is also mild left base scarring or atelectasis. Trace subcutaneous emphysema about  the right chest wall. IMPRESSION: Slightly worsened right-sided aeration with increased volume loss and presumed developing atelectasis. Right chest tube remaining in place, without pneumothorax. Electronically Signed   By: Abigail Miyamoto M.D.   On: 02/10/2022 08:18  ? ?DG Chest Port 1 View ? ?Result Date: 02/09/2022 ?CLINICAL DATA:  Right middle lobectomy, postop in PACU EXAM: PORTABLE CHEST 1 VIEW COMPARISON:  Chest radiograph 02/05/2022 FINDINGS: Postsurgical changes reflecting prior median sternotomy are again noted. There is a right apical chest tube in place. The cardiomediastinal silhouette is stable. The patient is status post right middle lobectomy. Minimal opacities  adjacent to the chain sutures projecting over the right midlung may reflect postsurgical atelectasis or small amount of hemorrhage. Otherwise, the remaining right lung is clear. There is no significant pleural effusion. There is no appreciable pneumothorax. The left lung is clear. There is no left effusion or pneumothorax There is no acute osseous abnormality. IMPRESSION: Postsurgical changes reflecting right middle lobectomy with right apical chest tube in place. No appreciable pneumothorax. Minimal opacities projecting along the sutures may reflect atelectasis or a small amount of hemorrhage. Electronically Signed   By: Valetta Mole M.D.   On: 02/09/2022 11:36   ? ?Assessment/Plan: ?S/P Procedure(s) (LRB): ?XI ROBOTIC ASSISTED THORASCOPY-RIGHT MIDDLE LOBECTOMY (Right) ?VIDEO BRONCHOSCOPY (N/A) ?POD#2 ? ?1 afeb, VSS, SBP 100's-140's ?2 sats good on RA ?3 CXR with improved aeration ?4 normal renal fxn ?5 H/H improved ?6 doing very well, stable for d/c home ? ? ? ? LOS: 2 days  ? ? ?John Giovanni PA-C ?Pager [phone] ?02/11/2022 ?  ?

## 2022-02-11 NOTE — Discharge Summary (Signed)
Physician Discharge Summary  ?Patient ID: ?Steven Hale. ?MRN: [id number] ?DOB/AGE: 1956/07/05 66 y.o. ? ?Admit date: 02/09/2022 ?Discharge date: 02/11/2022 ? ?Admission Diagnoses: Right lung mass ? ?Discharge Diagnoses:  ?Principal Problem: ?  S/P lobectomy of lung ? ? ?Patient Active Problem List  ? Diagnosis Date Noted  ? S/P lobectomy of lung 02/09/2022  ? Abnormal weight gain 04/10/2021  ? Flatulence, eructation and gas pain 04/10/2021  ? Pancreatic insufficiency 04/10/2021  ? DDD (degenerative disc disease), lumbar 05/30/2020  ? Chronic abdominal pain 05/28/2020  ? Solitary pulmonary nodule present on computed tomography of lung 05/27/2020  ? Sleep apnea 06/05/2019  ? Hypothyroidism 01/04/2019  ? Cocaine abuse ([place]) 01/02/2019  ? Pituitary macroadenoma with extrasellar extension ([place]) 12/13/2018  ? Coronary artery disease involving coronary bypass graft of native heart   ? Pre-diabetes 08/16/2015  ? Peripheral neuropathy 07/09/2015  ? Other male erectile dysfunction 03/07/2015  ? Essential hypertension, benign 03/07/2015  ? Pure hypercholesterolemia 12/10/2014  ? Essential hypertension 12/10/2014  ? S/P selective transsphenoidal pituitary adenomectomy 11/19/2014  ? Lumbar disc herniation with radiculopathy 10/15/2014  ? Chronic back pain 10/12/2014  ? H/O pancreatic cancer 2009 10/12/2014  ? Iron deficiency anemia 06/11/2014  ? Testosterone deficiency 09/07/2013  ? Benign paroxysmal positional vertigo 08/30/2013  ? Non-insulin dependent type 2 diabetes mellitus ([place]) 08/30/2013  ? PAD (peripheral artery disease) ([place]) 06/02/2012  ? Claudication ([place]) 05/14/2011  ? PVD (peripheral vascular disease) ([place]) 01/08/2009  ? BELL'S PALSY, RIGHT 12/25/2008  ? Dyslipidemia, goal LDL below 70 06/05/2008  ? Hx of CABG 2011 11/17/2003  ?  ?History of Present Illness:    ?Steven Hale. 66 y.o. 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  He is a former smoker who  quit in 2009, but has been followed at the [facility] for a 2 cm pulmonary nodule.  He recently underwent a PET/CT which showed minimal activity as well as a bronchoscopy which confirmed the diagnosis. ?  ?In regards to his symptoms, he does endorse some shortness of breath when climbing multiple flights of stairs.  He has undergone coronary revascularization back in 2012.  He denies any neurologic symptoms or weight changes. ? ?The patient and all relevant studies were reviewed by Dr. Kipp Brood who recommended proceeding with right middle lobe lobectomy.  He was admitted electively on 02/09/2022 for the procedure. ? ?Hospital course: ? ?The patient was admitted and on 02/09/2022 taken the operating room at which time he underwent a robotic assisted right middle lobe lobectomy.  He tolerated the procedure well was taken to the postanesthesia care unit in stable condition. ? ?Postoperative hospital course: ? ?The patient has done well.  There was minimal chest tube drainage and no evidence of air leak, therefore he chest tube was removed on postoperative day #1.  Oxygen has been weaned and he is maintaining good saturations.  He does have an expected acute blood loss anemia which is minor and being observed clinically.  Blood sugars have been under control using standard measures and he is home medication regimen has been resumed.  On postoperative day #2 his x-ray was stable with a very small apical space.  Atelectasis was improved in appearance.  His pain was under good control.  Incisions are noted to be healing well without evidence of infection.  He is tolerating diet.  Oxygen was weaned and he maintains good saturations on room air.  Final pathology is currently pending.  At  the time of discharge the patient is felt to be quite stable. ?  ?Discharged Condition: good ? ?PATHOLOGY: Pending ? ? ?Consults: None ? ?Significant Diagnostic Studies: DG Chest 2 View ? ?Result Date: 02/11/2022 ?CLINICAL DATA:  Status post  lobectomy. EXAM: CHEST - 2 VIEW COMPARISON:  02/10/2022 FINDINGS: Right chest tube has been removed. There is a small right apical pneumothorax measuring 5-10%. Densities at both lung bases are most compatible with atelectasis. Prior median sternotomy. Heart size is stable. Atherosclerotic calcifications at the aortic arch. IMPRESSION: Small right apical pneumothorax status post chest tube removal. Bibasilar atelectasis. Electronically Signed   By: Markus Daft M.D.   On: 02/11/2022 07:49  ? ?DG Chest 2 View ? ?Result Date: 02/08/2022 ?CLINICAL DATA:  Preoperative EXAM: CHEST - 2 VIEW COMPARISON:  January 13, 2022 FINDINGS: The cardiomediastinal silhouette is unchanged in contour.Status post median sternotomy and CABG. Atherosclerotic calcifications. Revisualization of a nodular opacity projecting over the RIGHT inferior hilum consistent with known pulmonary nodule. No pleural effusion. No pneumothorax. No acute pleuroparenchymal abnormality. Visualized abdomen is unremarkable. Multilevel degenerative changes of the thoracic spine. IMPRESSION: No acute cardiopulmonary abnormality. Similar appearance of a RIGHT middle lobe pulmonary nodule. Electronically Signed   By: Valentino Saxon M.D.   On: 02/08/2022 10:36  ? ?DG Chest Port 1 View ? ?Result Date: 02/10/2022 ?CLINICAL DATA:  Status post right middle lobectomy yesterday. EXAM: PORTABLE CHEST 1 VIEW COMPARISON:  Yesterday FINDINGS: Right-sided chest tube is unchanged in position. Prior median sternotomy. Borderline cardiomegaly. Atherosclerosis in the transverse aorta. Right hemidiaphragm elevation secondary to volume loss in the right hemithorax. No pneumothorax. Increased right mid lung airspace disease is likely atelectasis, just cephalad surgical sutures. There is also mild left base scarring or atelectasis. Trace subcutaneous emphysema about the right chest wall. IMPRESSION: Slightly worsened right-sided aeration with increased volume loss and presumed  developing atelectasis. Right chest tube remaining in place, without pneumothorax. Electronically Signed   By: Abigail Miyamoto M.D.   On: 02/10/2022 08:18  ? ?DG Chest Port 1 View ? ?Result Date: 02/09/2022 ?CLINICAL DATA:  Right middle lobectomy, postop in PACU EXAM: PORTABLE CHEST 1 VIEW COMPARISON:  Chest radiograph 02/05/2022 FINDINGS: Postsurgical changes reflecting prior median sternotomy are again noted. There is a right apical chest tube in place. The cardiomediastinal silhouette is stable. The patient is status post right middle lobectomy. Minimal opacities adjacent to the chain sutures projecting over the right midlung may reflect postsurgical atelectasis or small amount of hemorrhage. Otherwise, the remaining right lung is clear. There is no significant pleural effusion. There is no appreciable pneumothorax. The left lung is clear. There is no left effusion or pneumothorax There is no acute osseous abnormality. IMPRESSION: Postsurgical changes reflecting right middle lobectomy with right apical chest tube in place. No appreciable pneumothorax. Minimal opacities projecting along the sutures may reflect atelectasis or a small amount of hemorrhage. Electronically Signed   By: Valetta Mole M.D.   On: 02/09/2022 11:36  ? ?DG Chest Port 1 View ? ?Result Date: 01/13/2022 ?CLINICAL DATA:  Status post bronchoscopy. EXAM: PORTABLE CHEST 1 VIEW COMPARISON:  CT chest dated December 31, 2021. Chest x-ray dated March 09, 2021. FINDINGS: Stable cardiomediastinal silhouette status post CABG with normal heart size. Normal pulmonary vascularity. Unchanged 2.2 cm nodule at the right lung base. Mild right basilar atelectasis. Minimal linear atelectasis in the left mid lung. No focal consolidation, pleural effusion, or pneumothorax. No acute osseous abnormality. IMPRESSION: 1. Unchanged 2.2 cm nodule in the right  lung base. No pneumothorax post bronchoscopy. Electronically Signed   By: Titus Dubin M.D.   On: 01/13/2022 10:32   ? ?Myocardial Perfusion Imaging ? ?Result Date: 02/02/2022 ?  Findings are consistent with prior myocardial infarction. The study is low risk.   No ST deviation was noted.   LV perfusion is abnormal. There is no evidenc

## 2022-02-12 LAB — SURGICAL PATHOLOGY

## 2022-02-13 ENCOUNTER — Ambulatory Visit: Payer: Self-pay | Admitting: Thoracic Surgery (Cardiothoracic Vascular Surgery)

## 2022-02-13 VITALS — Ht 69.0 in

## 2022-02-19 ENCOUNTER — Other Ambulatory Visit: Payer: Self-pay | Admitting: *Deleted

## 2022-02-19 ENCOUNTER — Ambulatory Visit (INDEPENDENT_AMBULATORY_CARE_PROVIDER_SITE_OTHER): Payer: Self-pay | Admitting: Thoracic Surgery (Cardiothoracic Vascular Surgery)

## 2022-02-19 ENCOUNTER — Other Ambulatory Visit (HOSPITAL_COMMUNITY): Payer: Self-pay

## 2022-02-19 VITALS — BP 112/70 | HR 76 | Resp 20 | Ht 69.0 in | Wt 224.0 lb

## 2022-02-19 DIAGNOSIS — R911 Solitary pulmonary nodule: Secondary | ICD-10-CM

## 2022-02-19 DIAGNOSIS — Z902 Acquired absence of lung [part of]: Secondary | ICD-10-CM

## 2022-02-19 MED ORDER — OXYCODONE HCL 5 MG PO TABS
5.0000 mg | ORAL_TABLET | ORAL | 0 refills | Status: DC | PRN
  Filled 2022-02-19: qty 30, 5d supply, fill #0

## 2022-02-19 NOTE — Progress Notes (Signed)
? ?   ?  [address] ?      [place] [address] ?            [phone]       ? ?[name] ?[place] Record #[id number] ?Date of Birth: 06/10/1956 ? ?Referring: Garner Nash, DO ?Primary Care: Tana Coast, MD ?Primary Cardiologist:Brian Stanford Breed, MD ? ?Reason for visit:   follow-up ? ?History of Present Illness:     ?Steven Hale comes in for his 1 week follow-up appointment.  Overall he is.  He mostly complains of constipation and indigestion. ? ?Physical Exam: ?BP 112/70   Pulse 76   Resp 20   Ht 5\' 9"  (1.753 m)   Wt 224 lb (101.6 kg)   SpO2 95%   BMI 33.08 kg/m?  ? ?Alert NAD ?Incision, stitch removed.   ?Abdomen, ND ?No peripheral edema ? ? ?Diagnostic Studies & Laboratory data: ? ?Path:  ?FINAL MICROSCOPIC DIAGNOSIS:  ? ?A. LYMPH NODE, HILAR, EXCISION:  ?A lymph node with anthracotic pigmentation which is negative for  ?metastatic carcinoma.  ? ?B. LYMPH NODE, LEVEL 7 #1, EXCISION:  ?A lymph node with anthracotic pigmentation which is negative for  ?metastatic carcinoma.  ? ?C. LYMPH NODE, LEVEL 7 #2, EXCISION:  ?A lymph node with anthracotic pigmentation which is negative for  ?metastatic carcinoma.  ? ?D. LYMPH NODE, LEVEL 7 #3, EXCISION:  ?A lymph node with anthracotic pigmentation which is negative for  ?metastatic carcinoma.  ? ?E. LYMPH NODE, LEVEL 7 #4, EXCISION:  ?A lymph node with anthracotic pigmentation which is negative for  ?metastatic carcinoma.  ? ?F. LYMPH NODE, LEVEL 4R, #1, EXCISION:  ?A lymph node with anthracotic pigmentation which is negative for  ?metastatic carcinoma.  ? ?G. LUNG, RIGHT MIDDLE LOBE, LOBECTOMY:  ?(Weight: 52 gms.)  ?A small low-grade neuroendocrine tumor with an associated adjacent  ?benign Ciliated Muconodular Papillary Tumor with clear margins of  ?resection.  ?Please see comment after the synoptic report.  ?Please see the following synoptic report.  ? ?LUNG: Resection  ?Synchronous Tumors: Not applicable.  ?Total Number of  Primary Tumors: Two independent benign tumors.  ?Procedure: Lobectomy.  ?Specimen Laterality: Right.  ?Tumor Focality: Unifocal (Carcinoid).  ?Tumor Site: Middle lobe.  ?Tumor Size: Carcinoid tumor 11mm. CMPT 1.5cm.  ?Histologic Type: Neuroendocrine tumor (carcinoid tumor), NOS.  ?Visceral Pleura Invasion: Not identified.  ?Direct Invasion of Adjacent Structures: No adjacent structures present  ?Lymphovascular Invasion: Not identified  ?Margins:  ?     Margin Status for Non-Invasive Tumor: All margins are negative for  ?tumor.  ?Treatment Effect: No known presurgical therapy.  ?Regional Lymph Nodes:  ?     Number of Lymph Nodes Involved: None (0).  ?     Number of lymph nodes examined: Six (0)  ?Pathologic Stage Classification (pTNM, AJCC 8th Edition): pT1a, pN0 ?  ? ?Assessment / Plan:   ?66 year old male status post robotic assisted right middle lobectomy for a pT1 aN0 M0 carcinoid tumor.  He was also noted to have a benign Ciliated Muconodular Papillary Tumor.  His case was discussed in tumor board.  He will only require surveillance for both of these.  He will follow-up in 1 month with a chest x-ray. ? ? ?Steven Hale Steven Hale ?02/19/2022 4:20 PM ? ? ? ? ? ? ?

## 2022-02-19 NOTE — Progress Notes (Signed)
The proposed treatment discussed in cancer conference is for discussion purpose only and is not a binding recommendation. The patient was not physically examined nor present for their treatment options. Therefore, final treatment plans cannot be decided.  ?

## 2022-02-20 ENCOUNTER — Ambulatory Visit (HOSPITAL_COMMUNITY)
Admission: RE | Admit: 2022-02-20 | Discharge: 2022-02-20 | Disposition: A | Discharge status: Home / Self Care | Payer: No Typology Code available for payment source | Source: Ambulatory Visit | Attending: Cardiology | Admitting: Cardiology

## 2022-02-20 ENCOUNTER — Other Ambulatory Visit (HOSPITAL_COMMUNITY): Payer: Self-pay

## 2022-02-20 DIAGNOSIS — I739 Peripheral vascular disease, unspecified: Secondary | ICD-10-CM | POA: Diagnosis not present

## 2022-02-23 ENCOUNTER — Ambulatory Visit (INDEPENDENT_AMBULATORY_CARE_PROVIDER_SITE_OTHER): Payer: No Typology Code available for payment source | Admitting: Pulmonary Disease

## 2022-02-23 DIAGNOSIS — R918 Other nonspecific abnormal finding of lung field: Secondary | ICD-10-CM

## 2022-02-23 LAB — PULMONARY FUNCTION TEST
DL/VA % pred: 93 %
DL/VA: 3.89 ml/min/mmHg/L
DLCO cor % pred: 42 %
DLCO cor: 10.91 ml/min/mmHg
DLCO unc % pred: 37 %
DLCO unc: 9.82 ml/min/mmHg
FEF 25-75 Post: 1.68 L/sec
FEF 25-75 Pre: 0.55 L/sec
FEF2575-%Change-Post: 206 %
FEF2575-%Pred-Post: 64 %
FEF2575-%Pred-Pre: 21 %
FEV1-%Change-Post: 41 %
FEV1-%Pred-Post: 65 %
FEV1-%Pred-Pre: 46 %
FEV1-Post: 1.89 L
FEV1-Pre: 1.34 L
FEV1FVC-%Change-Post: 18 %
FEV1FVC-%Pred-Pre: 60 %
FEV6-%Change-Post: 18 %
FEV6-%Pred-Post: 89 %
FEV6-%Pred-Pre: 75 %
FEV6-Post: 3.24 L
FEV6-Pre: 2.74 L
FEV6FVC-%Change-Post: 1 %
FEV6FVC-%Pred-Post: 101 %
FEV6FVC-%Pred-Pre: 99 %
FVC-%Change-Post: 18 %
FVC-%Pred-Post: 90 %
FVC-%Pred-Pre: 75 %
FVC-Post: 3.41 L
FVC-Pre: 2.87 L
Post FEV1/FVC ratio: 55 %
Post FEV6/FVC ratio: 97 %
Pre FEV1/FVC ratio: 47 %
Pre FEV6/FVC Ratio: 96 %
RV % pred: 130 %
RV: 2.99 L
TLC % pred: 88 %
TLC: 6.05 L

## 2022-02-23 NOTE — Progress Notes (Signed)
Full PFTs completed today  ?

## 2022-03-05 ENCOUNTER — Inpatient Hospital Stay: Payer: No Typology Code available for payment source | Attending: Pulmonary Disease | Admitting: Genetic Counselor

## 2022-03-05 ENCOUNTER — Other Ambulatory Visit: Payer: Self-pay

## 2022-03-05 ENCOUNTER — Inpatient Hospital Stay: Payer: No Typology Code available for payment source

## 2022-03-05 DIAGNOSIS — D3A090 Benign carcinoid tumor of the bronchus and lung: Secondary | ICD-10-CM

## 2022-03-05 DIAGNOSIS — D136 Benign neoplasm of pancreas: Secondary | ICD-10-CM

## 2022-03-05 DIAGNOSIS — D352 Benign neoplasm of pituitary gland: Secondary | ICD-10-CM

## 2022-03-05 LAB — GENETIC SCREENING ORDER

## 2022-03-06 ENCOUNTER — Other Ambulatory Visit: Payer: Self-pay | Admitting: Thoracic Surgery (Cardiothoracic Vascular Surgery)

## 2022-03-07 ENCOUNTER — Other Ambulatory Visit (HOSPITAL_COMMUNITY): Payer: Self-pay

## 2022-03-09 ENCOUNTER — Other Ambulatory Visit (HOSPITAL_COMMUNITY): Payer: Self-pay

## 2022-03-09 MED ORDER — OZEMPIC (0.25 OR 0.5 MG/DOSE) 2 MG/3ML ~~LOC~~ SOPN
PEN_INJECTOR | SUBCUTANEOUS | 0 refills | Status: DC
  Filled 2022-03-09: qty 3, 56d supply, fill #0
  Filled 2022-04-22: qty 12, 84d supply, fill #0
  Filled 2022-04-28: qty 3, 56d supply, fill #0
  Filled 2022-05-26 – 2022-06-23 (×2): qty 3, 56d supply, fill #1
  Filled 2022-07-21 – 2022-12-09 (×2): qty 3, 56d supply, fill #2
  Filled 2023-01-31: qty 3, 56d supply, fill #3

## 2022-03-10 ENCOUNTER — Encounter: Payer: Self-pay | Admitting: Genetic Counselor

## 2022-03-10 ENCOUNTER — Other Ambulatory Visit (HOSPITAL_COMMUNITY): Payer: Self-pay

## 2022-03-10 NOTE — Progress Notes (Signed)
REFERRING PROVIDER: ?Wilhelmina Mcardle, MD ?[address] ?[place],  [place] [address] ? ?PRIMARY PROVIDER:  ?Tana Coast, MD ? ?PRIMARY REASON FOR VISIT:  ?1. Carcinoid tumor of lung, unspecified whether malignant   ?2. Pituitary macroadenoma with extrasellar extension ([place])   ?3. Pancreatic adenoma   ? ? ?HISTORY OF PRESENT ILLNESS:   ?Steven Hale, a 66 y.o. male, was seen for a [place] cancer genetics consultation at the request of Dr. Arnold Long due to a personal history of several endocrine-related tumors.  Steven Hale presents to clinic today to discuss the possibility of a hereditary predisposition to cancer, to discuss genetic testing, and to further clarify his future cancer risks, as well as potential cancer risks for family members.  ? ?In 2023, at the age of 45, Steven Hale was diagnosed with carcinoid tumors of the lung and bronchus.  He has a history of pancreatic cystadenoma diagnosed around age 43 and a history of a pituitary macradeoma.  He does not report a history of hyperparathyroidism.  ? ? ? ? ? ?Past Medical History:  ?Diagnosis Date  ? Anemia, unspecified   ? Bell's palsy   ? resolved - no deficits  ? Blood transfusion   ? during treatment for Ca  ? Blood transfusion without reported diagnosis   ? CAD (coronary artery disease)   ? a. s/p multiple PCIs;  b. s/p CABG in 09/2010 (LIMA-LAD, SVG-OM1, SVG-distal RCA/OM2);  Cardiac cath in 12/2015: Mild LAD disease, occluded mid LCX and proximal RCA (left to right collaterals), Occluded SVG to RCA and atretic LIMA. Patent SVG to OM  ? Chronic back pain   ? Diabetes mellitus without complication ([place])   ? type 2  ? DJD (degenerative joint disease)   ? low back & all over   ? GERD (gastroesophageal reflux disease)   ? Helicobacter pylori (H. pylori) infection   ? Hemorrhoids   ? History of shingles   ? Hyperlipidemia   ? Hypertension   ? Impotence of organic origin   ? Myocardial infarction [facility])   ? 2005 and 2012   ? PAD (peripheral artery disease)  ([place])   ? a. s/p bilat SFA stents;  b. ABIs 11/2012: R 0.99, L 0.86.  ? Pancreatic cancer ([place])   ? surgery 2009 cyst removed  ? Pituitary adenoma ([place])   ? surgery at [facility] Feb 2020  ? Sleep apnea 06/05/2019  ? does not use cpap  ? Substance abuse ([place])   ? drug and alcohol abuse  ? ? ?Past Surgical History:  ?Procedure Laterality Date  ? ABDOMINAL AORTAGRAM N/A 09/13/2013  ? Procedure: ABDOMINAL AORTAGRAM;  Surgeon: [place] Hampshire, MD;  Location[facility] CATH LAB;  Service: Cardiovascular;  Laterality: N/A;  ? ABDOMINAL AORTAGRAM N/A 08/29/2014  ? Procedure: ABDOMINAL AORTAGRAM;  Surgeon: [place] Hampshire, MD;  Location: [facility] CATH LAB;  Service: Cardiovascular;  Laterality: N/A;  ? ABDOMINAL AORTOGRAM W/LOWER EXTREMITY N/A 02/19/2021  ? Procedure: ABDOMINAL AORTOGRAM W/LOWER EXTREMITY;  Surgeon: [place] Hampshire, MD;  Location: [place] CV LAB;  Service: Cardiovascular;  Laterality: N/A;  ? BACK SURGERY    ? 1992  ? BRONCHIAL BIOPSY  01/13/2022  ? Procedure: BRONCHIAL BIOPSIES;  Surgeon: Garner Nash, DO;  Location: [facility] ENDOSCOPY;  Service: Pulmonary;;  ? BRONCHIAL BRUSHINGS  01/13/2022  ? Procedure: BRONCHIAL BRUSHINGS;  Surgeon: Garner Nash, DO;  Location: [place];  Service: Pulmonary;;  ? BRONCHIAL NEEDLE ASPIRATION BIOPSY  01/13/2022  ?  Procedure: BRONCHIAL NEEDLE ASPIRATION BIOPSIES;  Surgeon: Garner Nash, DO;  Location: [place];  Service: Pulmonary;;  ? CARDIAC CATHETERIZATION N/A 12/25/2015  ? Procedure: Left Heart Cath and Cors/Grafts Angiography;  Surgeon: [place] Hampshire, MD;  Location: [place] CV LAB;  Service: Cardiovascular;  Laterality: N/A;  ? COLONOSCOPY    ? CORONARY ARTERY BYPASS GRAFT  nov 2011  ? x 4  ? ENDARTERECTOMY FEMORAL Right 03/03/2021  ? Procedure: RIGHT FEMORAL ENDARTERECTOMY;  Surgeon: Cherre Robins, MD;  Location: [facility] OR;  Service: Vascular;  Laterality: Right;  ? ESOPHAGOGASTRODUODENOSCOPY (EGD) WITH PROPOFOL N/A 04/02/2017  ? Procedure: ESOPHAGOGASTRODUODENOSCOPY  (EGD) WITH PROPOFOL;  Surgeon: Carol Ada, MD;  Location: [facility] ENDOSCOPY;  Service: Endoscopy;  Laterality: N/A;  ? HEMORRHOID SURGERY  10/30/2011  ? Procedure: HEMORRHOIDECTOMY;  Surgeon: Harl Bowie, MD;  Location: [place];  Service: General;  Laterality: N/A;  ? JOINT REPLACEMENT    ? pancreatic  cancer  05/10/2008  ? Resection of distal panrease and spleen  ? PARTIAL KNEE ARTHROPLASTY Right 11/19/2014  ? Procedure: RIGHT UNI-KNEE ARTHROPLASTY MEDIALLY;  Surgeon: Mauri Pole, MD;  Location: [facility] ORS;  Service: Orthopedics;  Laterality: Right;  ? PATCH ANGIOPLASTY Right 03/03/2021  ? Procedure: PATCH ANGIOPLASTY;  Surgeon: Cherre Robins, MD;  Location: [place];  Service: Vascular;  Laterality: Right;  ? PERIPHERAL VASCULAR CATHETERIZATION  Oct 2014  ? Lt SFA PTA  ? PERIPHERAL VASCULAR CATHETERIZATION  Oct 2015  ? ISR Lt SFA-PTA  ? right partial knee replacement    ? splenectomy    ? THROMBECTOMY FEMORAL ARTERY Left 09/13/2013  ? Procedure: THROMBECTOMY FEMORAL ARTERY;  Surgeon: [place] Hampshire, MD;  Location: [place] CATH LAB;  Service: Cardiovascular;  Laterality: Left;  ? TRANSPHENOIDAL / TRANSNASAL HYPOPHYSECTOMY / RESECTION PITUITARY TUMOR  12/2018  ? [facility]  ? VIDEO BRONCHOSCOPY N/A 02/09/2022  ? Procedure: VIDEO BRONCHOSCOPY;  Surgeon: Lajuana Matte, MD;  Location: [place];  Service: Thoracic;  Laterality: N/A;  ? VIDEO BRONCHOSCOPY WITH RADIAL ENDOBRONCHIAL ULTRASOUND  01/13/2022  ? Procedure: RADIAL ENDOBRONCHIAL ULTRASOUND;  Surgeon: Garner Nash, DO;  Location: [facility] ENDOSCOPY;  Service: Pulmonary;;  ? ? ?FAMILY HISTORY:  ?We obtained a detailed, 4-generation family history.  Significant diagnoses are listed below: ?Family History  ?Problem Relation Age of Onset  ? Cancer Father   ?     unknown type; dx unknown age  ? ? ? ?Steven Hale is unaware of previous family history of genetic testing for hereditary cancer risks. There is no reported Ashkenazi Jewish ancestry. There is no known  consanguinity. ? ?GENETIC COUNSELING ASSESSMENT: Steven Hale is a 66 y.o. male with a personal history of tumors which is somewhat suggestive of a hereditary cancer syndrome. We, therefore, discussed and recommended the following at today's visit.  ? ?DISCUSSION: We discussed that 5 - 10% of cancer is hereditary.  We discussed that endocrine-related tumors, such as pituitary adenomas can be associated with mutations in the MEN1 gene. There are other genes that can be associated with hereditary endocrine-related cancer syndromes..  We discussed that testing is beneficial for several reasons including knowing how to follow individuals for their cancer risks and understanding if other family members could be at risk for cancer and allowing them to undergo genetic testing.  ? ?We reviewed the characteristics, features and inheritance patterns of hereditary cancer syndromes. We also discussed genetic testing, including the appropriate family members to test, the process of testing, insurance coverage and  turn-around-time for results. We discussed the implications of a negative, positive, carrier and/or variant of uncertain significant result.  ? ?The CancerNext-Expanded gene panel offered by [facility] and includes sequencing, rearrangement, and RNA analysis for the following 77 genes: AIP, ALK, APC, ATM, AXIN2, BAP1, BARD1, BLM, BMPR1A, BRCA1, BRCA2, BRIP1, CDC73, CDH1, CDK4, CDKN1B, CDKN2A, CHEK2, CTNNA1, DICER1, FANCC, FH, FLCN, GALNT12, KIF1B, LZTR1, MAX, MEN1, MET, MLH1, MSH2, MSH3, MSH6, MUTYH, NBN, NF1, NF2, NTHL1, PALB2, PHOX2B, PMS2, POT1, PRKAR1A, PTCH1, PTEN, RAD51C, RAD51D, RB1, RECQL, RET, SDHA, SDHAF2, SDHB, SDHC, SDHD, SMAD4, SMARCA4, SMARCB1, SMARCE1, STK11, SUFU, TMEM127, TP53, TSC1, TSC2, VHL and XRCC2 (sequencing and deletion/duplication); EGFR, EGLN1, HOXB13, KIT, MITF, PDGFRA, POLD1, and POLE (sequencing only); EPCAM and GREM1 (deletion/duplication only).  ? ? ?Based on Mr. Hale's personal  history of tumors, he meets medical criteria (NCCN MEN criteria) for genetic testing. Despite that he meets criteria, he may still have an out of pocket cost. We discussed that if his out of pocket cost for testing is over $100

## 2022-03-11 ENCOUNTER — Other Ambulatory Visit: Payer: Self-pay | Admitting: Thoracic Surgery (Cardiothoracic Vascular Surgery)

## 2022-03-11 ENCOUNTER — Other Ambulatory Visit (HOSPITAL_COMMUNITY): Payer: Self-pay

## 2022-03-13 ENCOUNTER — Ambulatory Visit: Payer: Self-pay | Admitting: Thoracic Surgery (Cardiothoracic Vascular Surgery)

## 2022-03-13 ENCOUNTER — Other Ambulatory Visit (HOSPITAL_COMMUNITY): Payer: Self-pay

## 2022-03-16 ENCOUNTER — Ambulatory Visit (INDEPENDENT_AMBULATORY_CARE_PROVIDER_SITE_OTHER): Payer: No Typology Code available for payment source | Admitting: Pulmonary Disease

## 2022-03-16 ENCOUNTER — Encounter: Payer: Self-pay | Admitting: Pulmonary Disease

## 2022-03-16 VITALS — BP 120/72 | HR 72 | Temp 98.1°F | Ht 69.0 in | Wt 222.4 lb

## 2022-03-16 DIAGNOSIS — D3A8 Other benign neuroendocrine tumors: Secondary | ICD-10-CM

## 2022-03-16 DIAGNOSIS — Z87891 Personal history of nicotine dependence: Secondary | ICD-10-CM | POA: Diagnosis not present

## 2022-03-16 DIAGNOSIS — R599 Enlarged lymph nodes, unspecified: Secondary | ICD-10-CM

## 2022-03-16 DIAGNOSIS — Z902 Acquired absence of lung [part of]: Secondary | ICD-10-CM | POA: Diagnosis not present

## 2022-03-16 NOTE — Progress Notes (Signed)
? ?Synopsis: Referred in February 2023 for lung nodule by Tana Coast, MD ? ?Subjective:  ? ?PATIENT ID: Steven Hale. GENDER: male DOB: 10-Jun-1956, MRN: [id number] ? ?Chief Complaint  ?Patient presents with  ? Follow-up  ?  Patient states that his chest is very painful. A little more shortness of breath with exertion. He states he keeps getting bad hot flashes since surgery.   ? ? ?This is a 66 year old gentleman, past medical history of coronary disease, type 2 diabetes, hypertension, hyperlipidemia, pancreatic cancer in 2009, sleep apnea.Patient had a lung cancer screening CT which revealed the right middle lobe well-circumscribed nodule was present on 05/03/2020, measuring 20 x 16 mm (average diameter 18 mm). On the current exam from 11/04/2021 this nodule measures 23 x 19 mm (average diameter 21 mm.  Unable to see the images today however they are present in epic care everywhere from the [facility] administration.  This lung cancer screening CT report was read as a lung RADS 4B and completed on 11/04/2021.  Here today to discuss evaluation of lung nodule consideration for biopsy.  Patient is a former smoker quit in 2009.  Smoked for 30+ years. ? ?OV 03/16/2022: doing better today.  Patient had a dotatate PET scan completed. hypermetabolic uptake is present within 2 mediastinal lymph nodes as detailed above, concerning for metastatic disease. Faint focus of increased uptake in the intercostal space adjacent to the right posterior seventh rib likely represents postoperative change. Per his ct report. I will discuss case with Dr. Kipp Brood.  Patient does feel like his incision sites from his previous surgery are significant better. ? ? ? ? ? ? ? ?Past Medical History:  ?Diagnosis Date  ? Anemia, unspecified   ? Bell's palsy   ? resolved - no deficits  ? Blood transfusion   ? during treatment for Ca  ? Blood transfusion without reported diagnosis   ? CAD (coronary artery disease)   ? a. s/p multiple PCIs;  b.  s/p CABG in 09/2010 (LIMA-LAD, SVG-OM1, SVG-distal RCA/OM2);  Cardiac cath in 12/2015: Mild LAD disease, occluded mid LCX and proximal RCA (left to right collaterals), Occluded SVG to RCA and atretic LIMA. Patent SVG to OM  ? Chronic back pain   ? Diabetes mellitus without complication ([place])   ? type 2  ? DJD (degenerative joint disease)   ? low back & all over   ? GERD (gastroesophageal reflux disease)   ? Helicobacter pylori (H. pylori) infection   ? Hemorrhoids   ? History of shingles   ? Hyperlipidemia   ? Hypertension   ? Impotence of organic origin   ? Myocardial infarction [facility])   ? 2005 and 2012   ? PAD (peripheral artery disease) ([place])   ? a. s/p bilat SFA stents;  b. ABIs 11/2012: R 0.99, L 0.86.  ? Pancreatic cancer ([place])   ? surgery 2009 cyst removed  ? Pituitary adenoma ([place])   ? surgery at [facility] Feb 2020  ? Sleep apnea 06/05/2019  ? does not use cpap  ? Substance abuse ([place])   ? drug and alcohol abuse  ?  ? ?Family History  ?Problem Relation Age of Onset  ? Heart attack Father   ?     died of MI at age 59  ? Cancer Father   ?     unknown type; dx unknown age  ? Anesthesia problems Neg Hx   ? Hypotension Neg Hx   ? Malignant hyperthermia Neg Hx   ?  Pseudochol deficiency Neg Hx   ? Colon cancer Neg Hx   ? Esophageal cancer Neg Hx   ? Prostate cancer Neg Hx   ? Rectal cancer Neg Hx   ? Stomach cancer Neg Hx   ?  ? ?Past Surgical History:  ?Procedure Laterality Date  ? ABDOMINAL AORTAGRAM N/A 09/13/2013  ? Procedure: ABDOMINAL AORTAGRAM;  Surgeon: Wellington Hampshire, MD;  Location: [facility] CATH LAB;  Service: Cardiovascular;  Laterality: N/A;  ? ABDOMINAL AORTAGRAM N/A 08/29/2014  ? Procedure: ABDOMINAL AORTAGRAM;  Surgeon: Wellington Hampshire, MD;  Location: [facility] CATH LAB;  Service: Cardiovascular;  Laterality: N/A;  ? ABDOMINAL AORTOGRAM W/LOWER EXTREMITY N/A 02/19/2021  ? Procedure: ABDOMINAL AORTOGRAM W/LOWER EXTREMITY;  Surgeon: Wellington Hampshire, MD;  Location: [place] CV LAB;  Service: Cardiovascular;   Laterality: N/A;  ? BACK SURGERY    ? 1992  ? BRONCHIAL BIOPSY  01/13/2022  ? Procedure: BRONCHIAL BIOPSIES;  Surgeon: Garner Nash, DO;  Location: [place] ENDOSCOPY;  Service: Pulmonary;;  ? BRONCHIAL BRUSHINGS  01/13/2022  ? Procedure: BRONCHIAL BRUSHINGS;  Surgeon: Garner Nash, DO;  Location: [place];  Service: Pulmonary;;  ? BRONCHIAL NEEDLE ASPIRATION BIOPSY  01/13/2022  ? Procedure: BRONCHIAL NEEDLE ASPIRATION BIOPSIES;  Surgeon: Garner Nash, DO;  Location: [place];  Service: Pulmonary;;  ? CARDIAC CATHETERIZATION N/A 12/25/2015  ? Procedure: Left Heart Cath and Cors/Grafts Angiography;  Surgeon: Wellington Hampshire, MD;  Location: [place] CV LAB;  Service: Cardiovascular;  Laterality: N/A;  ? COLONOSCOPY    ? CORONARY ARTERY BYPASS GRAFT  nov 2011  ? x 4  ? ENDARTERECTOMY FEMORAL Right 03/03/2021  ? Procedure: RIGHT FEMORAL ENDARTERECTOMY;  Surgeon: Cherre Robins, MD;  Location: [facility] OR;  Service: Vascular;  Laterality: Right;  ? ESOPHAGOGASTRODUODENOSCOPY (EGD) WITH PROPOFOL N/A 04/02/2017  ? Procedure: ESOPHAGOGASTRODUODENOSCOPY (EGD) WITH PROPOFOL;  Surgeon: Carol Ada, MD;  Location: [facility] ENDOSCOPY;  Service: Endoscopy;  Laterality: N/A;  ? HEMORRHOID SURGERY  10/30/2011  ? Procedure: HEMORRHOIDECTOMY;  Surgeon: Harl Bowie, MD;  Location: [place];  Service: General;  Laterality: N/A;  ? JOINT REPLACEMENT    ? pancreatic  cancer  05/10/2008  ? Resection of distal panrease and spleen  ? PARTIAL KNEE ARTHROPLASTY Right 11/19/2014  ? Procedure: RIGHT UNI-KNEE ARTHROPLASTY MEDIALLY;  Surgeon: Mauri Pole, MD;  Location: [facility] ORS;  Service: Orthopedics;  Laterality: Right;  ? PATCH ANGIOPLASTY Right 03/03/2021  ? Procedure: PATCH ANGIOPLASTY;  Surgeon: Cherre Robins, MD;  Location: [place];  Service: Vascular;  Laterality: Right;  ? PERIPHERAL VASCULAR CATHETERIZATION  Oct 2014  ? Lt SFA PTA  ? PERIPHERAL VASCULAR CATHETERIZATION  Oct 2015  ? ISR Lt SFA-PTA  ? right partial knee replacement     ? splenectomy    ? THROMBECTOMY FEMORAL ARTERY Left 09/13/2013  ? Procedure: THROMBECTOMY FEMORAL ARTERY;  Surgeon: Wellington Hampshire, MD;  Location: [place] CATH LAB;  Service: Cardiovascular;  Laterality: Left;  ? TRANSPHENOIDAL / TRANSNASAL HYPOPHYSECTOMY / RESECTION PITUITARY TUMOR  12/2018  ? [facility]  ? VIDEO BRONCHOSCOPY N/A 02/09/2022  ? Procedure: VIDEO BRONCHOSCOPY;  Surgeon: Lajuana Matte, MD;  Location: [place];  Service: Thoracic;  Laterality: N/A;  ? VIDEO BRONCHOSCOPY WITH RADIAL ENDOBRONCHIAL ULTRASOUND  01/13/2022  ? Procedure: RADIAL ENDOBRONCHIAL ULTRASOUND;  Surgeon: [place]ner Nash, DO;  Location: [place] ENDOSCOPY;  Service: Pulmonary;;  ? ? ?Social History  ? ?Socioeconomic History  ? Marital status: Single  ?  Spouse name: Not  on file  ? Number of children: 0  ? Years of education: Not on file  ? Highest education level: Not on file  ?Occupational History  ? Occupation: Diability  ?  Employer: UNEMPLOYED  ?Tobacco Use  ? Smoking status: Former  ?  Packs/day: 1.00  ?  Years: 18.00  ?  Pack years: 18.00  ?  Types: Cigarettes  ?  Quit date: 12/17/2007  ?  Years since quitting: 14.2  ? Smokeless tobacco: Never  ?Vaping Use  ? Vaping Use: Never used  ?Substance and Sexual Activity  ? Alcohol use: Yes  ?  Comment: occasional - rare  ? Drug use: Not Currently  ? Sexual activity: Not Currently  ?Other Topics Concern  ? Not on file  ?Social History Narrative  ? 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 [employer] [phone]  ? Single, no children  ? ?Social Determinants of Health  ? ?Financial Resource Strain: Not on file  ?Food Insecurity: Not on file  ?Transportation Needs: Not on file  ?Physical Activity: Not on file  ?Stress: Not on file  ?Social Connections: Not on file  ?Intimate Partner Violence: Not on file  ?  ? ?No Known Allergies  ? ?Outpatient Medications Prior to Visit  ?Medication Sig Dispense Refill  ? aspirin EC 81 MG tablet Take 1 tablet (81 mg total) by mouth daily.    ? atorvastatin  (LIPITOR) 40 MG tablet Take 1 tablet (40 mg total) by mouth at bedtime.    ? DULoxetine HCl 30 MG CSDR Take 30 mg by mouth 2 (two) times daily.    ? empagliflozin (JARDIANCE) 25 MG TABS tablet Take 25 mg by

## 2022-03-16 NOTE — Patient Instructions (Addendum)
Thank you for visiting Dr. Valeta Harms at [facility]. ?Today we recommend the following: ? ?Return in about 1 year (around 03/17/2023), or if symptoms worsen or fail to improve, for with APP or Dr. Valeta Harms. ? ? ? ?Please do your part to reduce the spread of COVID-19.  ? ?

## 2022-03-19 ENCOUNTER — Other Ambulatory Visit (HOSPITAL_COMMUNITY): Payer: Self-pay

## 2022-03-26 ENCOUNTER — Other Ambulatory Visit: Payer: Self-pay | Admitting: Thoracic Surgery (Cardiothoracic Vascular Surgery)

## 2022-03-26 DIAGNOSIS — Z902 Acquired absence of lung [part of]: Secondary | ICD-10-CM

## 2022-03-27 ENCOUNTER — Ambulatory Visit: Payer: No Typology Code available for payment source | Admitting: Thoracic Surgery (Cardiothoracic Vascular Surgery)

## 2022-03-27 ENCOUNTER — Other Ambulatory Visit (HOSPITAL_COMMUNITY): Payer: Self-pay

## 2022-03-28 ENCOUNTER — Other Ambulatory Visit (HOSPITAL_COMMUNITY): Payer: Self-pay

## 2022-03-28 ENCOUNTER — Other Ambulatory Visit: Payer: Self-pay | Admitting: Thoracic Surgery (Cardiothoracic Vascular Surgery)

## 2022-03-30 ENCOUNTER — Other Ambulatory Visit (HOSPITAL_COMMUNITY): Payer: Self-pay

## 2022-04-03 ENCOUNTER — Ambulatory Visit
Admission: RE | Admit: 2022-04-03 | Discharge: 2022-04-03 | Disposition: A | Discharge status: Home / Self Care | Payer: No Typology Code available for payment source | Source: Ambulatory Visit | Attending: Thoracic Surgery (Cardiothoracic Vascular Surgery) | Admitting: Thoracic Surgery (Cardiothoracic Vascular Surgery)

## 2022-04-03 ENCOUNTER — Ambulatory Visit (INDEPENDENT_AMBULATORY_CARE_PROVIDER_SITE_OTHER): Payer: Self-pay | Admitting: Thoracic Surgery (Cardiothoracic Vascular Surgery)

## 2022-04-03 VITALS — BP 127/76 | HR 67 | Resp 20 | Ht 69.0 in | Wt 228.0 lb

## 2022-04-03 DIAGNOSIS — Z902 Acquired absence of lung [part of]: Secondary | ICD-10-CM

## 2022-04-03 DIAGNOSIS — Z09 Encounter for follow-up examination after completed treatment for conditions other than malignant neoplasm: Secondary | ICD-10-CM

## 2022-04-03 NOTE — Progress Notes (Signed)
      [address]       [place] [address]             Our[place] Rodney Record #[id number] Date of Birth: 01-23-1956  Referring: Garner Nash, DO Primary Care: Tana Coast, MD Primary Cardiologist:Brian Stanford Breed, MD  Reason for visit:   follow-up  History of Present Illness:     [name] comes in for follow-up appointment complaining of mild pain along his incisions.  He denies any shortness of breath.  Physical Exam: BP 127/76   Pulse 67   Resp 20   Ht 5\' 9"  (1.753 m)   Wt 228 lb (103.4 kg)   SpO2 92% Comment: RA  BMI 33.67 kg/m   Alert NAD Incision clean.  Abdomen, ND No peripheral edema   Diagnostic Studies & Laboratory data: CXR:: Clear     Assessment / Plan:   66 year old male status post right middle lobectomy for what originally was thought to be a stage I carcinoid tumor, however his [place] oncologist ordered a dotatate scan afterwards which showed concern for activity in his mediastinal lymph nodes.  Intraoperatively we checked a total of 6 nodes and none had any disease.  He will continue to follow-up with his oncologist for further therapy.  In regards to his pain he is currently being seen by pain clinic thus I cannot prescribe any narcotics, and have recommended that he stagger Tylenol and ibuprofen.  He will follow-up as needed.   Yani Lal O Latresa Gasser 04/03/2022 12:00 PM

## 2022-04-06 ENCOUNTER — Other Ambulatory Visit (HOSPITAL_COMMUNITY): Payer: Self-pay

## 2022-04-06 ENCOUNTER — Encounter: Payer: Self-pay | Admitting: Genetic Counselor

## 2022-04-06 ENCOUNTER — Telehealth: Payer: Self-pay | Admitting: Genetic Counselor

## 2022-04-06 ENCOUNTER — Ambulatory Visit: Payer: Self-pay | Admitting: Genetic Counselor

## 2022-04-06 DIAGNOSIS — Z1379 Encounter for other screening for genetic and chromosomal anomalies: Secondary | ICD-10-CM | POA: Insufficient documentation

## 2022-04-06 DIAGNOSIS — D136 Benign neoplasm of pancreas: Secondary | ICD-10-CM

## 2022-04-06 DIAGNOSIS — D3A090 Benign carcinoid tumor of the bronchus and lung: Secondary | ICD-10-CM

## 2022-04-06 DIAGNOSIS — D352 Benign neoplasm of pituitary gland: Secondary | ICD-10-CM

## 2022-04-06 NOTE — Telephone Encounter (Signed)
Revealed negative genetic testing and VUSs in APC and NF1.

## 2022-04-08 NOTE — Progress Notes (Signed)
HPI:   Mr. Meyn was previously seen in the [place] clinic due to a personal history of a pituitary adenoma and carcinoid tumors and concerns regarding a hereditary predisposition to cancer. Please refer to our prior cancer genetics clinic note for more information regarding our discussion, assessment and recommendations, at the time. Mr. Blizard recent genetic test results were disclosed to him, as were recommendations warranted by these results. These results and recommendations are discussed in more detail below.  CANCER HISTORY:    In 2023, at the age of 18, Mr. 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  He has a history of pancreatic cystadenoma diagnosed around age 20 and a history of a pituitary macradeoma.  He does not report a history of hyperparathyroidism.    FAMILY HISTORY:  We obtained a detailed, 4-generation family history.  Significant diagnoses are listed below:      Family History  Problem Relation Age of Onset   Cancer Father          unknown type; dx unknown age      Mr. Guzzi is unaware of previous family history of genetic testing for hereditary cancer risks. There is no reported Ashkenazi Jewish ancestry. There is no known consanguinity.    GENETIC TEST RESULTS:  The [facility] CancerNext-Expanded +RNAinsight Panel found no pathogenic mutations.   The CancerNext-Expanded gene panel offered by [facility] and includes sequencing, rearrangement, and RNA analysis for the following 77 genes: AIP, ALK, APC, ATM, AXIN2, BAP1, BARD1, BLM, BMPR1A, BRCA1, BRCA2, BRIP1, CDC73, CDH1, CDK4, CDKN1B, CDKN2A, CHEK2, CTNNA1, DICER1, FANCC, FH, FLCN, GALNT12, KIF1B, LZTR1, MAX, MEN1, MET, MLH1, MSH2, MSH3, MSH6, MUTYH, NBN, NF1, NF2, NTHL1, PALB2, PHOX2B, PMS2, POT1, PRKAR1A, PTCH1, PTEN, RAD51C, RAD51D, RB1, RECQL, RET, SDHA, SDHAF2, SDHB, SDHC, SDHD, SMAD4, SMARCA4, SMARCB1, SMARCE1, STK11, SUFU, TMEM127, TP53, TSC1, TSC2, VHL and XRCC2  (sequencing and deletion/duplication); EGFR, EGLN1, HOXB13, KIT, MITF, PDGFRA, POLD1, and POLE (sequencing only); EPCAM and GREM1 (deletion/duplication only).  .   The test report has been scanned into EPIC and is located under the Molecular Pathology section of the Results Review tab.  A portion of the result report is included below for reference. Genetic testing reported out on Mar 24, 2022.     Genetic testing identified two variants of uncertain significance (VUS)---one in the APC gene called c.6850C>T (p.P2284S) and a second in the NF1 gene called p.G5364W (c.7424C>G).[name]  At this time, it is unknown if these variants are associated with an increased risk for cancer or if it is benign, but most uncertain variants are reclassified to benign. They should not be used to make medical management decisions. With time, we suspect the laboratory will determine the significance of these variants, if any. If the laboratory reclassifies these variants, we will attempt to contact Mr. Kluge to discuss it further.   Even though a pathogenic variant was not identified, possible explanations for the tumors/cancer in the family may include: There may be no hereditary risk for cancer in the family. The tumors/cancer in Mr. Beals and/or his family may be sporadic/familial or due to other genetic and environmental factors. There may be a gene mutation in one of these genes that current testing methods cannot detect but that chance is small. There could be another gene that has not yet been discovered, or that we have not yet tested, that is responsible for the cancer diagnoses in the family.   Therefore, it is important to remain  in touch with cancer genetics in the future so that we can continue to offer Mr. Dougal the most up to date genetic testing.   ADDITIONAL GENETIC TESTING:  We discussed with Mr. Quillin that his genetic testing was fairly extensive.  If there are additional relevant genes identified to  increase cancer risk that can be analyzed in the future, we would be happy to discuss and coordinate this testing at that time.     CANCER SCREENING RECOMMENDATIONS:  Mr. Bloom test result is considered negative (normal).  This means that we have not identified a hereditary cause for his personal history of carcinoid tumors and pituitary adenoma at this time.   Given available information, he does not appear to meet criteria for a clinical diagnosis of multiple endocrine neoplasia (MEN) type 1.  We recommended he keep in touch with [place] regarding personal/family history.  Per [facility], a clinical diagnosis of MEN1 can be established with two or more of the following endocrine tumors---parathyroid, anterior pituitary, and well-differentiated neuroendocrine tumors of the GEP tract.   An individual's cancer risk and medical management are not determined by genetic test results alone. Overall cancer risk assessment incorporates additional factors, including personal medical history, family history, and any available genetic information that may result in a personalized plan for cancer prevention and surveillance. Therefore, it is recommended he continue to follow the cancer management and screening guidelines provided by his oncology and primary healthcare provider.   RECOMMENDATIONS FOR FAMILY MEMBERS:   Individuals in this family might be at some increased risk of developing cancer, over the general population risk, due to the family history of cancer.  Individuals in the family should notify their providers of the family history of cancer/tumors.  We do not recommend familial testing for the APC and NF1 variants of uncertain significance (VUS).  FOLLOW-UP:  Lastly, we discussed with Mr. Mochizuki that cancer genetics is a rapidly advancing field and it is possible that new genetic tests will be appropriate for him and/or his family members in the future. We encouraged him to remain in contact with cancer  genetics on an annual basis so we can update his personal and family histories and let him know of advances in cancer genetics that may benefit this family.   Our contact number was provided. Mr. Heffner questions were answered to his satisfaction, and he knows he is welcome to call us at anytime with additional questions or concerns.   Kaitlyn Skowron M. Joette Catching, [place], [facility] Genetic Counselor [email or web address] (P) [phone]

## 2022-04-22 ENCOUNTER — Other Ambulatory Visit (HOSPITAL_COMMUNITY): Payer: Self-pay

## 2022-04-28 ENCOUNTER — Other Ambulatory Visit (HOSPITAL_COMMUNITY): Payer: Self-pay

## 2022-05-12 ENCOUNTER — Other Ambulatory Visit (HOSPITAL_COMMUNITY): Payer: Self-pay

## 2022-05-23 ENCOUNTER — Other Ambulatory Visit (HOSPITAL_COMMUNITY): Payer: Self-pay

## 2022-05-26 ENCOUNTER — Other Ambulatory Visit (HOSPITAL_COMMUNITY): Payer: Self-pay

## 2022-05-26 ENCOUNTER — Other Ambulatory Visit: Payer: Self-pay | Admitting: Thoracic Surgery (Cardiothoracic Vascular Surgery)

## 2022-06-11 ENCOUNTER — Other Ambulatory Visit (HOSPITAL_COMMUNITY): Payer: Self-pay

## 2022-06-12 ENCOUNTER — Other Ambulatory Visit (HOSPITAL_COMMUNITY): Payer: Self-pay

## 2022-06-17 ENCOUNTER — Other Ambulatory Visit (HOSPITAL_COMMUNITY): Payer: Self-pay

## 2022-06-21 ENCOUNTER — Other Ambulatory Visit (HOSPITAL_COMMUNITY): Payer: Self-pay

## 2022-06-22 ENCOUNTER — Other Ambulatory Visit (HOSPITAL_COMMUNITY): Payer: Self-pay

## 2022-06-23 ENCOUNTER — Other Ambulatory Visit (HOSPITAL_COMMUNITY): Payer: Self-pay

## 2022-07-03 ENCOUNTER — Other Ambulatory Visit (HOSPITAL_COMMUNITY): Payer: Self-pay

## 2022-07-03 ENCOUNTER — Other Ambulatory Visit: Payer: Self-pay | Admitting: Thoracic Surgery (Cardiothoracic Vascular Surgery)

## 2022-07-05 ENCOUNTER — Other Ambulatory Visit: Payer: Self-pay

## 2022-07-05 ENCOUNTER — Emergency Department (HOSPITAL_COMMUNITY)
Admission: EM | Admit: 2022-07-05 | Discharge: 2022-07-05 | Disposition: A | Discharge status: Home / Self Care | Payer: No Typology Code available for payment source | Attending: Emergency Medicine | Admitting: Emergency Medicine

## 2022-07-05 DIAGNOSIS — Z79899 Other long term (current) drug therapy: Secondary | ICD-10-CM | POA: Insufficient documentation

## 2022-07-05 DIAGNOSIS — E119 Type 2 diabetes mellitus without complications: Secondary | ICD-10-CM | POA: Diagnosis not present

## 2022-07-05 DIAGNOSIS — Z8507 Personal history of malignant neoplasm of pancreas: Secondary | ICD-10-CM | POA: Diagnosis not present

## 2022-07-05 DIAGNOSIS — R197 Diarrhea, unspecified: Secondary | ICD-10-CM | POA: Diagnosis not present

## 2022-07-05 DIAGNOSIS — Z7984 Long term (current) use of oral hypoglycemic drugs: Secondary | ICD-10-CM | POA: Diagnosis not present

## 2022-07-05 DIAGNOSIS — Z7982 Long term (current) use of aspirin: Secondary | ICD-10-CM | POA: Insufficient documentation

## 2022-07-05 DIAGNOSIS — I10 Essential (primary) hypertension: Secondary | ICD-10-CM | POA: Diagnosis not present

## 2022-07-05 DIAGNOSIS — E340 Carcinoid syndrome: Secondary | ICD-10-CM

## 2022-07-05 LAB — COMPREHENSIVE METABOLIC PANEL
ALT: 26 U/L (ref 0–44)
AST: 31 U/L (ref 15–41)
Albumin: 4.5 g/dL (ref 3.5–5.0)
Alkaline Phosphatase: 73 U/L (ref 38–126)
Anion gap: 5 (ref 5–15)
BUN: 16 mg/dL (ref 8–23)
CO2: 21 mmol/L — ABNORMAL LOW (ref 22–32)
Calcium: 9.4 mg/dL (ref 8.9–10.3)
Chloride: 110 mmol/L (ref 98–111)
Creatinine, Ser: 0.95 mg/dL (ref 0.61–1.24)
GFR, Estimated: >60 mL/min (ref >60)
Glucose, Bld: 140 mg/dL — ABNORMAL HIGH (ref 70–99)
Potassium: 4.1 mmol/L (ref 3.5–5.1)
Sodium: 136 mmol/L (ref 135–145)
Total Bilirubin: 0.5 mg/dL (ref 0.3–1.2)
Total Protein: 7.7 g/dL (ref 6.5–8.1)

## 2022-07-05 LAB — CBC
HCT: 40.6 % (ref 39.0–52.0)
Hemoglobin: 13.1 g/dL (ref 13.0–17.0)
MCH: 29 pg (ref 26.0–34.0)
MCHC: 32.3 g/dL (ref 30.0–36.0)
MCV: 89.8 fL (ref 80.0–100.0)
Platelets: 304 10*3/uL (ref 150–400)
RBC: 4.52 MIL/uL (ref 4.22–5.81)
RDW: 15.8 % — ABNORMAL HIGH (ref 11.5–15.5)
WBC: 5.5 10*3/uL (ref 4.0–10.5)
nRBC: 0 % (ref 0.0–0.2)

## 2022-07-05 MED ORDER — LOPERAMIDE HCL 2 MG PO CAPS
4.0000 mg | ORAL_CAPSULE | Freq: Once | ORAL | Status: AC
  Administered 2022-07-05: 4 mg via ORAL
  Filled 2022-07-05: qty 2

## 2022-07-05 NOTE — Discharge Instructions (Addendum)
Please return to the ED with any new or worsening signs or symptoms such as fevers, blood in stool, lightheadedness, dizziness, weakness Please follow-up with your oncologist for further management of your suspected carcinoid syndrome and diarrhea Please continue taking Imodium for diarrhea relief Please read attached guide concerning carcinoid tumors

## 2022-07-05 NOTE — ED Triage Notes (Signed)
BIBA from home for diarrhea, states every time he eats he has diarrhea, no n/v, no abd pain but feels bloated

## 2022-07-05 NOTE — ED Provider Notes (Signed)
[facility] DEPT Provider Note   CSN: [id number] Arrival date & time: 07/05/22  0547     History  Chief Complaint  Patient presents with   Diarrhea    Steven Hale. is a 66 y.o. male with medical history significant for substance abuse, pancreatic cancer, PAD, MI, hypertension, GERD, diabetes, anemia, carcinoid tumor.  Patient presents to ED for evaluation of diarrhea.  Patient reports that for the last 3 days he has had diarrhea every time that he eats food.  The patient states that he has not changed his diet, is not trying any new restaurants or foods.  The patient states that the diarrhea is consistently watery and loose.  There is no blood in his stools.  The patient does not denies any abdominal pain, fevers, nausea or vomiting, dysuria.  Patient states he has not attempted to take any medications for his diarrhea.  Patient on chart review seems to have been diagnosed recently with carcinoid tumor.   Diarrhea Associated symptoms: no abdominal pain, no fever and no vomiting        Home Medications Prior to Admission medications   Medication Sig Start Date End Date Taking? Authorizing Provider  aspirin EC 81 MG tablet Take 1 tablet (81 mg total) by mouth daily. 11/19/14   Danae Orleans, PA-C  atorvastatin (LIPITOR) 40 MG tablet Take 1 tablet (40 mg total) by mouth at bedtime. 02/11/22   Gold, Wilder Glade, PA-C  DULoxetine HCl 30 MG CSDR Take 30 mg by mouth 2 (two) times daily.    [provider]  empagliflozin (JARDIANCE) 25 MG TABS tablet Take 25 mg by mouth daily. Takes a half tablet in the morning    [provider]  ezetimibe (ZETIA) 10 MG tablet Take 1 tablet by mouth once daily. 02/03/22   Wellington Hampshire, MD  fish oil-omega-3 fatty acids 1000 MG capsule Take 1 capsule (1 g total) by mouth daily. 11/30/12   Rosana Hoes, MD  levothyroxine (SYNTHROID) 25 MCG tablet Take 25 mcg by mouth daily. 01/30/19   [provider]   lisinopril (ZESTRIL) 2.5 MG tablet Take 2.5 mg by mouth daily. 08/19/20   [provider]  metFORMIN (GLUCOPHAGE) 500 MG tablet Take 1 tablet (500 mg total) by mouth 2 (two) times daily with a meal. 02/11/22   Gold, Wayne E, PA-C  metoprolol tartrate (LOPRESSOR) 25 MG tablet Take 1 tablet by mouth two times daily. PATIENT NEEDS TO SCHEDULE AND OFFICE VISIT FOR FUTURE REFILLS. 02/03/22   Wellington Hampshire, MD  Multiple Vitamin (MULTIVITAMIN) tablet Take 1 tablet by mouth daily. 11/30/12   Rosana Hoes, MD  nitroGLYCERIN (NITROSTAT) 0.4 MG SL tablet Place 0.4 mg under the tongue every 2 (two) hours as needed for chest pain. 10/28/16   [provider]  oxyCODONE (OXY IR/ROXICODONE) 5 MG immediate release tablet Take 1 tablet (5 mg total) by mouth every 4 (four) hours as needed for severe pain. 02/19/22   Lightfoot, Lucile Crater, MD  pantoprazole (PROTONIX) 40 MG tablet Take 1 tablet (40 mg total) by mouth daily. 02/03/22   Wellington Hampshire, MD  pregabalin (LYRICA) 150 MG capsule Take 2 capsules (300 mg total) by mouth 2 (two) times daily. 02/11/22   Gold, Wilder Glade, PA-C  Propylene Glycol (SYSTANE BALANCE) 0.6 % SOLN Place 1 drop into both eyes 3 (three) times daily as needed (dry eyes).    [provider]  Semaglutide,0.25 or 0.5MG /DOS, (OZEMPIC, 0.25 OR 0.5 MG/DOSE,)  2 MG/3ML SOPN Inject 0.25 mg into the skin once a week 01/27/22     Semaglutide,0.25 or 0.5MG /DOS, (OZEMPIC, 0.25 OR 0.5 MG/DOSE,) 2 MG/3ML SOPN Inject 0.25 mg into the skin once a week 03/09/22     sildenafil (VIAGRA) 100 MG tablet Take 0.5-1 tablets (50-100 mg total) by mouth daily as needed for erectile dysfunction. 08/25/18   Horald Pollen, MD      Allergies    Patient has no known allergies.    Review of Systems   Review of Systems  Constitutional:  Negative for fever.  Gastrointestinal:  Positive for diarrhea. Negative for abdominal pain, blood in stool, nausea, rectal pain and vomiting.  Genitourinary:   Negative for dysuria.  All other systems reviewed and are negative.   Physical Exam Updated Vital Signs BP (!) 153/94   Pulse 64   Temp 97.7 F (36.5 C) (Oral)   Resp 11   Ht 5\' 9"  (1.753 m)   Wt 104.3 kg   SpO2 97%   BMI 33.97 kg/m  Physical Exam Vitals and nursing note reviewed.  Constitutional:      General: He is not in acute distress.    Appearance: Normal appearance. He is not ill-appearing, toxic-appearing or diaphoretic.  HENT:     Head: Normocephalic and atraumatic.     Nose: Nose normal. No congestion.     Mouth/Throat:     Mouth: Mucous membranes are moist.     Pharynx: Oropharynx is clear.  Eyes:     Extraocular Movements: Extraocular movements intact.     Conjunctiva/sclera: Conjunctivae normal.     Pupils: Pupils are equal, round, and reactive to light.  Cardiovascular:     Rate and Rhythm: Normal rate and regular rhythm.  Pulmonary:     Effort: Pulmonary effort is normal.     Breath sounds: Normal breath sounds. No wheezing.  Abdominal:     General: Abdomen is flat. Bowel sounds are normal.     Palpations: Abdomen is soft.     Tenderness: There is no abdominal tenderness. There is no right CVA tenderness or left CVA tenderness.  Musculoskeletal:     Cervical back: Normal range of motion and neck supple. No tenderness.  Skin:    General: Skin is warm and dry.     Capillary Refill: Capillary refill takes less than 2 seconds.  Neurological:     Mental Status: He is alert and oriented to person, place, and time.     ED Results / Procedures / Treatments   Labs (all labs ordered are listed, but only abnormal results are displayed) Labs Reviewed  CBC - Abnormal; Notable for the following components:      Result Value   RDW 15.8 (*)    All other components within normal limits  COMPREHENSIVE METABOLIC PANEL - Abnormal; Notable for the following components:   CO2 21 (*)    Glucose, Bld 140 (*)    All other components within normal limits     EKG None  Radiology No results found.  Procedures Procedures   Medications Ordered in ED Medications  loperamide (IMODIUM) capsule 4 mg (4 mg Oral Given 07/05/22 0720)    ED Course/ Medical Decision Making/ A&P                           Medical Decision Making Amount and/or Complexity of Data Reviewed Labs: ordered.  Risk Prescription drug management.   66 year old male presents  to ED for evaluation.  Please see HPI for further details.  On examination the patient is afebrile and nontachycardic.  Patient lung sounds clear bilaterally, he is not hypoxic.  The patient abdomen is soft and compressible, there is no CVA tenderness.  The patient neurological examination shows no focal neurodeficits.  Patient worked up utilizing the following labs interpreted by me personally: - CBC unremarkable - BMP unremarkable  Patient treated with 4 mg Imodium.  Most likely cause of patient diarrhea due to recent diagnosis of carcinoid tumor.  The patient was advised/educated about effects of carcinoid tumor.  Patient will be advised to follow-up with his oncologist for further management.  The patient was given return precautions and he voiced understanding.  The patient had all of his questions answered to satisfaction.  The patient is stable at this time for discharge home.  Final Clinical Impression(s) / ED Diagnoses Final diagnoses:  Diarrhea, unspecified type  Carcinoid syndrome [facility])    Rx / DC Orders ED Discharge Orders     None         Lawana Chambers 07/05/22 [employer], Ankit, MD 07/06/22 2228

## 2022-07-16 NOTE — Progress Notes (Signed)
Thoracic Location of Tumor / Histology:  Malignant carcinoid tumor of right lung   Patient presented with symptoms of: (from Dr. 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 Surgical resection included 4R, hilar and subcarinal LNs which were negative for metastasis or second primary tumor. Unfortunately Steven Hale has had flushing and severe sweating in the past two months and dotetate PET on 02/25/22 showed dotetate (+) pretracheal LN. Symptoms have initially improved on octreotide, but seem to be getting worse again."  CT Chest w/ Contrast  04/28/2022   PET w/ Dotatate  02/25/2022 --Impression Hypermetabolic uptake is present within 2 mediastinal lymph nodes as detailed above, concerning for metastatic disease.  Faint focus of increased uptake in the intercostal space adjacent to the right posterior seventh rib likely represents postoperative change. Attention on follow-up.   Biopsies revealed:  05/28/2022 Final Interpretation   - MEDIASTINAL LYMPH NODE, STATION 4R, FINE NEEDLE ASPIRATION:      No malignant cells identified.   02/09/2022 FINAL MICROSCOPIC DIAGNOSIS:  A. LYMPH NODE, HILAR, EXCISION:  - A lymph node with anthracotic pigmentation which is negative for metastatic carcinoma.  B. LYMPH NODE, LEVEL 7 #1, EXCISION:  - A lymph node with anthracotic pigmentation which is negative for metastatic carcinoma.  C. LYMPH NODE, LEVEL 7 #2, EXCISION:  - A lymph node with anthracotic pigmentation which is negative for metastatic carcinoma.  D. LYMPH NODE, LEVEL 7 #3, EXCISION:  - A lymph node with anthracotic pigmentation which is negative for metastatic carcinoma.  E. LYMPH NODE, LEVEL 7 #4, EXCISION:  - A lymph node with anthracotic pigmentation which is negative for  metastatic carcinoma.  F. LYMPH NODE, LEVEL 4R, #1, EXCISION:  - A lymph node with anthracotic pigmentation which is negative for metastatic carcinoma.  G. LUNG, RIGHT MIDDLE LOBE, LOBECTOMY:  (Weight: 52 gms.)  - A small low-grade neuroendocrine tumor with an associated adjacent benign Ciliated Muconodular Papillary Tumor with clear margins of resection.  Comment: The patient's clinical history and slides from prior needle biopsy performed on 01/13/2022([id number]) are reviewed. The needle biopsy results concur with the smaller lesion (carcinoid tumor) identified in the lung. The adjacent larger lesion  represents a benign entity called Ciliated Muconodular Papillary Tumor. This case was also reviewed by Dr. Thressa Sheller and Dr. Jaquita Folds who concur with the above interpretation.  The following immunostains were performed on block G3 which represents the CMPT adjacent to the carcinoid tumor:  - TTF-1: Positive.  - NAPSIN-A: Positive in some cells.  - Chromogranin: Negative.  - CD56: Negative.  - CK7: Positive.   Tobacco/Marijuana/Snuff/ETOH use: Has a history of smoking (1 pack/day for ~18 years), but quit in 2009. Reports occasional alcohol consumption. Denies any recreational drug use  Past/Anticipated interventions by cardiothoracic surgery, if any:  05/28/2022 --Dr. Reyne Dumas ([place]) Bronchoscopy w EBUS   04/03/2022 --Dr. Melodie Bouillon (office visit)  Assessment / Plan:   66 year old male status post right middle lobectomy for what originally was thought to be a stage I carcinoid tumor, however his [place] oncologist ordered a dotatate scan afterwards which showed concern for activity in his mediastinal lymph nodes.   Intraoperatively we checked a total of 6 nodes and none had any disease.   He will continue to follow-up with his oncologist for further therapy.   In  regards to his pain he is currently being seen by pain clinic thus I cannot prescribe any narcotics, and have  recommended that he stagger Tylenol and ibuprofen.   He will follow-up as needed  02/09/2022 --Dr. Melodie Bouillon  Robotic assisted right video thoracoscopy Right middle lobectomy Mediastinal lymph node sampling Intercostal nerve block  Past/Anticipated interventions by medical oncology, if any:  Sees Dr.  Tomie China ([facility] in [place], [place])  Signs/Symptoms Weight changes, if any: Patient denies Respiratory complaints, if any: Patient denies Hemoptysis, if any: Patient denies Pain issues, if any:  Patient denies  SAFETY ISSUES: Prior radiation? No Pacemaker/ICD? No  Possible current pregnancy? N/A Is the patient on methotrexate? No  Current Complaints / other details:  Nothing else of note

## 2022-07-16 NOTE — Progress Notes (Addendum)
Radiation Oncology         [phone]) [phone] ________________________________  Initial Outpatient Consultation  Name: Steven Hale. MRN: [id number]  Date: 07/17/2022  DOB: Apr 04, 1956  HQ:[id number], Daiva Huge, MD  Beatris Si, MD   REFERRING PHYSICIAN: Beatris Si, MD  DIAGNOSIS:     ICD-10-CM   1. 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 is a 66 y.o. male who presented for a CT of the abdomen and pelvis for evaluation of abdominal pain on 04/19/2020 which incidentally revealed a 1.8 cm nodule within the central right middle lobe. Chest CT with contrast on 05/03/2020 redemonstrated the right middle lobe pulmonary nodule, measuring 20 x 16 mm, as concerning for solitary metastasis or primary bronchogenic carcinoma. CT also showed enlargement of a 2.6 x 2.1 cm nodule involving the lateral limb of the left adrenal gland.   PET on 05/29/20 showed very low metabolic activity associated with the right middle lobe pulmonary nodule, favoring benign etiology (nodule measured 1.9 x 1.6 cm at the time of this study). Low metabolic activity was also seen of the enlarging left adrenal gland, favoring benign etiology. PET otherwise showed no evidence of local pancreatic colon cancer recurrence or adenopathy.   Given PET findings, the patient was instructed to return for 6 month follow up imaging. The patient had his follow up imaging performed at the [place]. These results are not available in EMR, however encounter notes indicate that the patient received follow up imaging at the [facility] which showed enlargement of the RML nodule in  the interval.   Given enlargement of the nodule, the patient was referred from the [place] to Dr. Valeta Harms on 12/24/21 for consideration robotic assisted navigational bronchoscopy and tissue sampling. Super-D chest CT ordered by Dr. Valeta Harms on 12/31/21 showed a mild increase in size of the right middle lobe lung nodule, measuring 2.2 cm in maximum dimension. A stable enlarged low right paratracheal lymph node was also appreciated.  PET scan on 01/05/22 showed continued enlargement of a right middle lobe pulmonary nodule with only mildly increased metabolic activity only slightly greater than mediastinal blood pool.  The patient opted to proceed with bronchoscopy with RML biopsies on 01/13/22 under the care of Dr. Valeta Harms. Pathology revealed findings consistent with well-differentiated neuroendocrine (typical carcinoid) tumor.   The patient was promptly referred to thoracic surgery, and underwent RML lobectomy/resection and lymph node excisions on 02/09/22 under the care of Dr. Kipp Brood. Pathology showed findings consistent with a low-grade neuroendocrine tumor (carcinoid tumor), and an associated adjacent benign Ciliated Muconodular Papillary Tumor with clear margins of resection. Nodal status of 6/6 lymph nodes negative for malignancy (lymph nodes examined included one hilar lymph node, four level 7 lymph nodes, and one level 4R lymph node).    Dr. Kipp Brood presented the patients case at the tumor board who recommended proceeding with surveillance.  [facility] a [place] PET on 02/25/22 which showed a hypermetabolic 1.7 cm paracarinal lymph node, and 1 cm paratracheal 4R lymph node.  I do not currently have access to that imaging  Chest CT on 04/28/22 showed a new right upper lobe patchy  density near the major fissure, likely post surgical in etiology. CT also showed stability of the 4 mm right upper lobe pulmonary nodule, stable mediastinal lymphadenopathy, and a stable left upper  quadrant nodule.  I do not currently have access to that imaging  During a visit with Dr. Marcello Moores at the [place] on 05/21/22, the patient reported ongoing instances of hot flashes/sweats multiple times per day, which initially improved on octreotide but had since returned. From a respiratory standpoint, the patient reported mild dyspnea on exertion and vague chest pain/tightenss sometimes with coughing, sometimes at rest, not clearly related to exertion. His recent PET was also reviewed at the time of this visit, and Dr. Marcello Moores recommended proceeding with nodal biopsies.   FNA of the station 4R mediastinal lymph node on 05/28/22 was negative for malignant cells.   He reports that he continues octreotide monthly at the [facility].  He reports that he has been referred to [facility] for their opinion regarding his management.  Thus far he has been scheduled with Dr. Leslie Andrea of medical oncology   He currently endorses 2-3 "sweats" per day  To his knowledge he is not followed by an endocrinologist.  History pseudopapillary tumor of the pancreas 2009, and pituitary macroadenoma in 2020 (both resected)  PREVIOUS RADIATION THERAPY: No  PAST MEDICAL HISTORY:  has a past medical history of Anemia, unspecified, Bell's palsy, Blood transfusion, Blood transfusion without reported diagnosis, CAD (coronary artery disease), Chronic back pain, Diabetes mellitus without complication ([place]), DJD (degenerative joint disease), GERD (gastroesophageal reflux disease), Helicobacter pylori (H. pylori) infection, Hemorrhoids, History of shingles, Hyperlipidemia, Hypertension, Impotence of organic origin, Myocardial infarction [facility]), PAD (peripheral artery disease) ([place]), Pancreatic cancer ([place]), Pituitary adenoma ([place]), Sleep apnea (06/05/2019), and Substance abuse ([place]).    PAST SURGICAL HISTORY: Past Surgical History:  Procedure Laterality Date   ABDOMINAL AORTAGRAM N/A 09/13/2013    Procedure: ABDOMINAL AORTAGRAM;  Surgeon: Wellington Hampshire, MD;  Location: [place] CATH LAB;  Service: Cardiovascular;  Laterality: N/A;   ABDOMINAL AORTAGRAM N/A 08/29/2014   Procedure: ABDOMINAL Maxcine Ham;  Surgeon: Wellington Hampshire, MD;  Location: [place] CATH LAB;  Service: Cardiovascular;  Laterality: N/A;   ABDOMINAL AORTOGRAM W/LOWER EXTREMITY N/A 02/19/2021   Procedure: ABDOMINAL AORTOGRAM W/LOWER EXTREMITY;  Surgeon: Wellington Hampshire, MD;  Location: [place] CV LAB;  Service: Cardiovascular;  Laterality: N/A;   BACK SURGERY     1992   BRONCHIAL BIOPSY  01/13/2022   Procedure: BRONCHIAL BIOPSIES;  Surgeon: Garner Nash, DO;  Location: [place];  Service: Pulmonary;;   BRONCHIAL BRUSHINGS  01/13/2022   Procedure: BRONCHIAL BRUSHINGS;  Surgeon: Garner Nash, DO;  Location: [place];  Service: Pulmonary;;   BRONCHIAL NEEDLE ASPIRATION BIOPSY  01/13/2022   Procedure: BRONCHIAL NEEDLE ASPIRATION BIOPSIES;  Surgeon: Garner Nash, DO;  Location: [place];  Service: Pulmonary;;   CARDIAC CATHETERIZATION N/A 12/25/2015   Procedure: Left Heart Cath and Cors/Grafts Angiography;  Surgeon: Wellington Hampshire, MD;  Location: [place] CV LAB;  Service: Cardiovascular;  Laterality: N/A;   COLONOSCOPY     CORONARY ARTERY BYPASS GRAFT  nov 2011   x 4   ENDARTERECTOMY FEMORAL Right 03/03/2021   Procedure: RIGHT FEMORAL ENDARTERECTOMY;  Surgeon: Cherre Robins, MD;  Location: [place];  Service: Vascular;  Laterality: Right;   ESOPHAGOGASTRODUODENOSCOPY (EGD) WITH PROPOFOL N/A 04/02/2017   Procedure: ESOPHAGOGASTRODUODENOSCOPY (EGD) WITH PROPOFOL;  Surgeon: Carol Ada, MD;  Location: [facility];  Service:  Endoscopy;  Laterality: N/A;   HEMORRHOID SURGERY  10/30/2011   Procedure: HEMORRHOIDECTOMY;  Surgeon: Harl Bowie, MD;  Location: [place];  Service: General;  Laterality: N/A;   JOINT REPLACEMENT     pancreatic  cancer  05/10/2008   Resection of distal panrease and spleen    PARTIAL KNEE ARTHROPLASTY Right 11/19/2014   Procedure: RIGHT UNI-KNEE ARTHROPLASTY MEDIALLY;  Surgeon: Mauri Pole, MD;  Location: [facility];  Service: Orthopedics;  Laterality: Right;   PATCH ANGIOPLASTY Right 03/03/2021   Procedure: PATCH ANGIOPLASTY;  Surgeon: Cherre Robins, MD;  Location: [place];  Service: Vascular;  Laterality: Right;   PERIPHERAL VASCULAR CATHETERIZATION  Oct 2014   Lt SFA PTA   PERIPHERAL VASCULAR CATHETERIZATION  Oct 2015   ISR Lt SFA-PTA   right partial knee replacement     splenectomy     THROMBECTOMY FEMORAL ARTERY Left 09/13/2013   Procedure: THROMBECTOMY FEMORAL ARTERY;  Surgeon: Wellington Hampshire, MD;  Location: [place] CATH LAB;  Service: Cardiovascular;  Laterality: Left;   TRANSPHENOIDAL / TRANSNASAL HYPOPHYSECTOMY / RESECTION PITUITARY TUMOR  12/2018   [facility]   VIDEO BRONCHOSCOPY N/A 02/09/2022   Procedure: VIDEO BRONCHOSCOPY;  Surgeon: Lajuana Matte, MD;  Location: [place];  Service: Thoracic;  Laterality: N/A;   VIDEO BRONCHOSCOPY WITH RADIAL ENDOBRONCHIAL ULTRASOUND  01/13/2022   Procedure: RADIAL ENDOBRONCHIAL ULTRASOUND;  Surgeon: Garner Nash, DO;  Location: [facility];  Service: Pulmonary;;    FAMILY HISTORY: family history includes Cancer in his father and sister; Heart attack in his father.  SOCIAL HISTORY:  reports that he quit smoking about 14 years ago. His smoking use included cigarettes. He has a 18.00 pack-year smoking history. He has never used smokeless tobacco. He reports that he does not currently use alcohol. He reports that he does not currently use drugs.  ALLERGIES: Patient has no known allergies.  MEDICATIONS:  Current Outpatient Medications  Medication Sig Dispense Refill   colchicine 0.6 MG tablet Take 1 tablet by mouth as needed.     ibuprofen (ADVIL) 400 MG tablet Take 400 mg by mouth 2 (two) times daily as needed.     tamsulosin (FLOMAX) 0.4 MG CAPS capsule Take 1 capsule by mouth daily.     aspirin EC 81 MG tablet Take 1  tablet (81 mg total) by mouth daily.     atorvastatin (LIPITOR) 40 MG tablet Take 1 tablet (40 mg total) by mouth at bedtime.     DULoxetine HCl 30 MG CSDR Take 30 mg by mouth 2 (two) times daily.     empagliflozin (JARDIANCE) 25 MG TABS tablet Take 25 mg by mouth daily. Takes a half tablet in the morning     ezetimibe (ZETIA) 10 MG tablet Take 1 tablet by mouth once daily. 90 tablet 3   fish oil-omega-3 fatty acids 1000 MG capsule Take 1 capsule (1 g total) by mouth daily. 60 capsule 1   levothyroxine (SYNTHROID) 25 MCG tablet Take 25 mcg by mouth daily.     lisinopril (ZESTRIL) 2.5 MG tablet Take 2.5 mg by mouth daily.     metFORMIN (GLUCOPHAGE) 500 MG tablet Take 1 tablet (500 mg total) by mouth 2 (two) times daily with a meal.     metoprolol tartrate (LOPRESSOR) 25 MG tablet Take 1 tablet by mouth two times daily. PATIENT NEEDS TO SCHEDULE AND OFFICE VISIT FOR FUTURE REFILLS. 60 tablet 11   Multiple Vitamin (MULTIVITAMIN) tablet Take 1 tablet by mouth daily. 30 tablet  10   nitroGLYCERIN (NITROSTAT) 0.4 MG SL tablet Place 0.4 mg under the tongue every 2 (two) hours as needed for chest pain.     oxyCODONE (OXY IR/ROXICODONE) 5 MG immediate release tablet Take 1 tablet (5 mg total) by mouth every 4 (four) hours as needed for severe pain. 30 tablet 0   pantoprazole (PROTONIX) 40 MG tablet Take 1 tablet (40 mg total) by mouth daily. 30 tablet 11   pregabalin (LYRICA) 150 MG capsule Take 2 capsules (300 mg total) by mouth 2 (two) times daily.     Propylene Glycol (SYSTANE BALANCE) 0.6 % SOLN Place 1 drop into both eyes 3 (three) times daily as needed (dry eyes).     Semaglutide,0.25 or 0.5MG/DOS, (OZEMPIC, 0.25 OR 0.5 MG/DOSE,) 2 MG/3ML SOPN Inject 0.25 mg into the skin once a week 6 mL 0   Semaglutide,0.25 or 0.5MG/DOS, (OZEMPIC, 0.25 OR 0.5 MG/DOSE,) 2 MG/3ML SOPN Inject 0.25 mg into the skin once a week 12 mL 0   sildenafil (VIAGRA) 100 MG tablet Take 0.5-1 tablets (50-100 mg total) by mouth daily  as needed for erectile dysfunction. 5 tablet 11   No current facility-administered medications for this encounter.    REVIEW OF SYSTEMS:  Notable for that above.   PHYSICAL EXAM:  height is _0  (1.753 m) and weight is 218 lb 3.2 oz (99 kg). His temperature is 97.8 F (36.6 C). His blood pressure is 109/70 and his pulse is 80. His respiration is 20 and oxygen saturation is 100%.   General: Alert and oriented, in no acute distress  HEENT: Head is normocephalic. Extraocular movements are intact.  No oral thrush Neck: Neck is supple, no palpable cervical or supraclavicular lymphadenopathy. Heart: Regular in rate and rhythm with no murmurs, rubs, or gallops. Chest: Clear to auscultation bilaterally, with no rhonchi, wheezes, or rales. Extremities: No cyanosis or edema. Lymphatics: see Neck Exam Skin: No concerning lesions. Musculoskeletal: Well-nourished ,good muscle tone, ambulatory Neurologic: Cranial nerves II through XII are grossly intact. No obvious focalities. Speech is fluent. Coordination is intact. Psychiatric: Judgment and insight are intact. Affect is appropriate.   ECOG = 1  0 - Asymptomatic (Fully active, able to carry on all predisease activities without restriction)  1 - Symptomatic but completely ambulatory (Restricted in physically strenuous activity but ambulatory and able to carry out work of a light or sedentary nature. For example, light housework, office work)  2 - Symptomatic, <50% in bed during the day (Ambulatory and capable of all self care but unable to carry out any work activities. Up and about more than 50% of waking hours)  3 - Symptomatic, >50% in bed, but not bedbound (Capable of only limited self-care, confined to bed or chair 50% or more of waking hours)  4 - Bedbound (Completely disabled. Cannot carry on any self-care. Totally confined to bed or chair)  5 - Death   Eustace Pen MM, Creech RH, Tormey DC, et al. [phone]). "Toxicity and response criteria of  the [facility]". [place] Oncol. 5 (6): 649-55   LABORATORY DATA:  Lab Results  Component Value Date   WBC 5.5 07/05/2022   HGB 13.1 07/05/2022   HCT 40.6 07/05/2022   MCV 89.8 07/05/2022   PLT 304 07/05/2022   CMP     Component Value Date/Time   NA 136 07/05/2022 0738   NA 142 02/04/2021 1150   K 4.1 07/05/2022 0738   CL 110 07/05/2022 0738   CO2 21 (  L) 07/05/2022 0738   GLUCOSE 140 (H) 07/05/2022 0738   BUN 16 07/05/2022 0738   BUN 11 02/04/2021 1150   CREATININE 0.95 07/05/2022 0738   CREATININE 0.96 08/20/2016 1103   CALCIUM 9.4 07/05/2022 0738   PROT 7.7 07/05/2022 0738   PROT 7.2 08/09/2020 1549   ALBUMIN 4.5 07/05/2022 0738   ALBUMIN 4.8 08/09/2020 1549   AST 31 07/05/2022 0738   ALT 26 07/05/2022 0738   ALKPHOS 73 07/05/2022 0738   BILITOT 0.5 07/05/2022 0738   BILITOT 0.3 08/09/2020 1549   GFRNONAA >60 07/05/2022 0738   GFRNONAA 86 08/20/2016 1103   GFRAA 96 01/08/2020 1034   GFRAA >89 08/20/2016 1103         RADIOGRAPHY: Reports as above.  I cannot access his imaging currently.   IMPRESSION/PLAN: This is a very pleasant 66 year old gentleman with history as above.  Most recently he has been coping with low-grade neuroendocrine tumor (carcinoid tumor) of the lung.  pT1a, pN0. This was resected with 6 positive nodes.  Later this year PET imaging showed possible hypermetabolic activity in the thoracic nodes.  Bronchoscopy and biopsy did not establish any pathologic evidence of persistent disease.  He recently referred to me by Dr. Alroy Dust of [facility] cardiothoracic surgery for my consideration regarding radiation therapy.  Medical oncology opinion is pending at [facility] with Dr. Leamon Arnt.   I talked with Dr. Alroy Dust today by phone as the patient has quite a complex history that has extended over multiple institutions.  He does not have any pathologic proof of persistent disease but his symptoms are concerning for residual disease.  It  is unclear if his residual disease is only in lymph nodes or if it is microscopic and more disseminated.  Due to the patient's previous heart surgery Dr. Alroy Dust does not feel that he can safely perform repeat resection of his thoracic lymph nodes at the [facility].  However he believes that it might be a possibility at [facility] based on the resources.  I talked to the patient about potential options.  The patient understands that he may need more diagnostic testing.  He understands that he may ultimately benefit from more surgery or more systemic therapy.   I explained to the patient that low-grade carcinoid tumor is not usually treated with standard radiation therapy unless other treatment options are exhausted.  While SBRT could be considered, we cannot safely give extremely ablative doses to the central airways like we can safely give to the lung parenchyma, ie for lung nodules.   I think he needs to be considered at [facility] for systemic therapy and /or possible surgery, first.  The patient also understands that treatment is not urgent.  He will undergo a full evaluation at [facility].  In the meantime we are working on getting his outside imaging.  If ultimately radiation is his best option, this can be delivered here at [facility] so that his treatment is closer to home.  We do have stereotactic technology and [employer] True Beam linear accelerators.  I also let the patient know that he may benefit from an endocrinology consultation.  Is not clear to me based on his outside records if all of his endocrinology needs are met currently.  He has a very complex history.  He will talk about this further with Dr. Duke Salvia, his PCP at the [facility], who he sees today.  I will see him back as needed and he will pursue his consultations  at [facility] as planned.  The above was discussed with the patient as well as Dr. Alroy Dust.  On date of service, in total, I spent 65 minutes on this  encounter. Patient was seen in person.   __________________________________________   Eppie Gibson, MD  This document serves as a record of services personally performed by Eppie Gibson, MD. It was created on her behalf by Roney Mans, a trained medical scribe. The creation of this record is based on the scribe's personal observations and the provider's statements to them. This document has been checked and approved by the attending provider.

## 2022-07-17 ENCOUNTER — Ambulatory Visit
Admission: RE | Admit: 2022-07-17 | Discharge: 2022-07-17 | Disposition: A | Discharge status: Home / Self Care | Payer: No Typology Code available for payment source | Source: Ambulatory Visit | Attending: Radiation Oncology | Admitting: Radiation Oncology

## 2022-07-17 ENCOUNTER — Other Ambulatory Visit: Payer: Self-pay

## 2022-07-17 ENCOUNTER — Encounter: Payer: Self-pay | Admitting: Radiation Oncology

## 2022-07-17 DIAGNOSIS — D3A090 Benign carcinoid tumor of the bronchus and lung: Secondary | ICD-10-CM

## 2022-07-17 DIAGNOSIS — C7A090 Malignant carcinoid tumor of the bronchus and lung: Secondary | ICD-10-CM | POA: Insufficient documentation

## 2022-07-17 DIAGNOSIS — C7A00 Malignant carcinoid tumor of unspecified site: Secondary | ICD-10-CM | POA: Insufficient documentation

## 2022-07-21 ENCOUNTER — Encounter: Payer: Self-pay | Admitting: Cardiovascular Disease

## 2022-07-21 ENCOUNTER — Other Ambulatory Visit (HOSPITAL_COMMUNITY): Payer: Self-pay

## 2022-07-21 ENCOUNTER — Ambulatory Visit
Payer: No Typology Code available for payment source | Attending: Cardiovascular Disease | Admitting: Cardiovascular Disease

## 2022-07-21 VITALS — BP 112/70 | HR 72 | Ht 69.0 in | Wt 213.0 lb

## 2022-07-21 DIAGNOSIS — I25810 Atherosclerosis of coronary artery bypass graft(s) without angina pectoris: Secondary | ICD-10-CM | POA: Diagnosis not present

## 2022-07-21 DIAGNOSIS — I10 Essential (primary) hypertension: Secondary | ICD-10-CM

## 2022-07-21 DIAGNOSIS — I739 Peripheral vascular disease, unspecified: Secondary | ICD-10-CM

## 2022-07-21 DIAGNOSIS — E785 Hyperlipidemia, unspecified: Secondary | ICD-10-CM

## 2022-07-21 NOTE — Progress Notes (Signed)
Cardiology Office Note   Date:  07/21/2022   ID:  Steven Hale., DOB 27-Oct-1956, MRN [id number]  PCP:  Tana Coast, MD  Cardiologist:  Dr. Jamesetta Geralds  No chief complaint on file.     History of Present Illness: Steven Hale. is a 66 y.o. male who presents for a followup visit regarding peripheral arterial disease and coronary artery disease. He has known history of coronary artery disease with multiple interventions in the past. He had CABG in 2011.  He quit smoking in 2009.  He had pituitary adenoma resection. He has known history of peripheral arterial disease with intervention on both SFAs. Cardiac catheterization in January, 2017 showed mild nonobstructive LAD disease, occluded mid left circumflex and occluded proximal right coronary artery with left-to-right collaterals. SVG to RCA was occluded and LIMA to LAD was atretic. SVG to OM was normal. Abnormal stress test was due to chronically occluded right coronary artery and graft with left-to-right collaterals. His native RCA was not favorable for CTO PCI. I recommended medical therapy. Imdur was added.   He had recurrent right leg claudication in 2022.  Angiography in April of 2022  showed no significant aortoiliac disease.  On the right side, there was severe calcified stenosis in the common femoral artery with patent SFA stent with moderate in-stent restenosis and two-vessel runoff below the knee.  On the left side, there was mild common femoral artery disease, patent SFA stent with minimal restenosis and two-vessel runoff below the knee. The patient underwent right common femoral artery endarterectomy by Dr. Stanford Breed in April 2022.  He had lobectomy done in March of this year by Dr. Kipp Brood and was diagnosed with carcinoid tumor.  He is currently getting evaluated at [facility] regarding management options including chemotherapy.    Regarding his peripheral arterial disease, most recent noninvasive vascular studies in April  of this year showed an ABI of 0.88 on the right and 1.04 on the left.  Duplex showed significant restenosis in the right SFA stent with peak velocity of 384.  He reports stable symptoms of numbness in his feet with minimal claudication at the present time. No shortness of breath.  He has occasional sharp chest discomfort but no prolonged tightness or exertional symptoms.  Past Medical History:  Diagnosis Date   Anemia, unspecified    Bell's palsy    resolved - no deficits   Blood transfusion    during treatment for Ca   Blood transfusion without reported diagnosis    CAD (coronary artery disease)    a. s/p multiple PCIs;  b. s/p CABG in 09/2010 (LIMA-LAD, SVG-OM1, SVG-distal RCA/OM2);  Cardiac cath in 12/2015: Mild LAD disease, occluded mid LCX and proximal RCA (left to right collaterals), Occluded SVG to RCA and atretic LIMA. Patent SVG to OM   Chronic back pain    Diabetes mellitus without complication (HCC)    type 2   DJD (degenerative joint disease)    low back & all over    GERD (gastroesophageal reflux disease)    Helicobacter pylori (H. pylori) infection    Hemorrhoids    History of shingles    Hyperlipidemia    Hypertension    Impotence of organic origin    Myocardial infarction ([place])    2005 and 2012    PAD (peripheral artery disease) ([place])    a. s/p bilat SFA stents;  b. ABIs 11/2012: R 0.99, L 0.86.   Pancreatic cancer [facility])    surgery 2009 cyst removed  Pituitary adenoma [facility])    surgery at [facility] Feb 2020   Sleep apnea 06/05/2019   does not use cpap   Substance abuse ([place])    drug and alcohol abuse    Past Surgical History:  Procedure Laterality Date   ABDOMINAL AORTAGRAM N/A 09/13/2013   Procedure: ABDOMINAL Maxcine Ham;  Surgeon: Wellington Hampshire, MD;  Location: [place] CATH LAB;  Service: Cardiovascular;  Laterality: N/A;   ABDOMINAL AORTAGRAM N/A 08/29/2014   Procedure: ABDOMINAL Maxcine Ham;  Surgeon: Wellington Hampshire, MD;  Location: [place] CATH LAB;  Service:  Cardiovascular;  Laterality: N/A;   ABDOMINAL AORTOGRAM W/LOWER EXTREMITY N/A 02/19/2021   Procedure: ABDOMINAL AORTOGRAM W/LOWER EXTREMITY;  Surgeon: Wellington Hampshire, MD;  Location: [place] CV LAB;  Service: Cardiovascular;  Laterality: N/A;   BACK SURGERY     1992   BRONCHIAL BIOPSY  01/13/2022   Procedure: BRONCHIAL BIOPSIES;  Surgeon: Garner Nash, DO;  Location: [place];  Service: Pulmonary;;   BRONCHIAL BRUSHINGS  01/13/2022   Procedure: BRONCHIAL BRUSHINGS;  Surgeon: Garner Nash, DO;  Location: [place];  Service: Pulmonary;;   BRONCHIAL NEEDLE ASPIRATION BIOPSY  01/13/2022   Procedure: BRONCHIAL NEEDLE ASPIRATION BIOPSIES;  Surgeon: Garner Nash, DO;  Location: [place];  Service: Pulmonary;;   CARDIAC CATHETERIZATION N/A 12/25/2015   Procedure: Left Heart Cath and Cors/Grafts Angiography;  Surgeon: Wellington Hampshire, MD;  Location: [place] CV LAB;  Service: Cardiovascular;  Laterality: N/A;   COLONOSCOPY     CORONARY ARTERY BYPASS GRAFT  nov 2011   x 4   ENDARTERECTOMY FEMORAL Right 03/03/2021   Procedure: RIGHT FEMORAL ENDARTERECTOMY;  Surgeon: Cherre Robins, MD;  Location: [place];  Service: Vascular;  Laterality: Right;   ESOPHAGOGASTRODUODENOSCOPY (EGD) WITH PROPOFOL N/A 04/02/2017   Procedure: ESOPHAGOGASTRODUODENOSCOPY (EGD) WITH PROPOFOL;  Surgeon: Carol Ada, MD;  Location: [facility];  Service: Endoscopy;  Laterality: N/A;   HEMORRHOID SURGERY  10/30/2011   Procedure: HEMORRHOIDECTOMY;  Surgeon: Harl Bowie, MD;  Location: [place];  Service: General;  Laterality: N/A;   JOINT REPLACEMENT     pancreatic  cancer  05/10/2008   Resection of distal panrease and spleen   PARTIAL KNEE ARTHROPLASTY Right 11/19/2014   Procedure: RIGHT UNI-KNEE ARTHROPLASTY MEDIALLY;  Surgeon: Mauri Pole, MD;  Location: [facility];  Service: Orthopedics;  Laterality: Right;   PATCH ANGIOPLASTY Right 03/03/2021   Procedure: PATCH ANGIOPLASTY;  Surgeon: Cherre Robins, MD;  Location: [place];  Service: Vascular;  Laterality: Right;   PERIPHERAL VASCULAR CATHETERIZATION  Oct 2014   Lt SFA PTA   PERIPHERAL VASCULAR CATHETERIZATION  Oct 2015   ISR Lt SFA-PTA   right partial knee replacement     splenectomy     THROMBECTOMY FEMORAL ARTERY Left 09/13/2013   Procedure: THROMBECTOMY FEMORAL ARTERY;  Surgeon: Wellington Hampshire, MD;  Location: [place] CATH LAB;  Service: Cardiovascular;  Laterality: Left;   TRANSPHENOIDAL / TRANSNASAL HYPOPHYSECTOMY / RESECTION PITUITARY TUMOR  12/2018   [facility]   VIDEO BRONCHOSCOPY N/A 02/09/2022   Procedure: VIDEO BRONCHOSCOPY;  Surgeon: Lajuana Matte, MD;  Location: [place];  Service: Thoracic;  Laterality: N/A;   VIDEO BRONCHOSCOPY WITH RADIAL ENDOBRONCHIAL ULTRASOUND  01/13/2022   Procedure: RADIAL ENDOBRONCHIAL ULTRASOUND;  Surgeon: Garner Nash, DO;  Location: [facility];  Service: Pulmonary;;     Current Outpatient Medications  Medication Sig Dispense Refill   aspirin EC 81 MG tablet Take 1 tablet (81 mg total) by mouth  daily.     atorvastatin (LIPITOR) 40 MG tablet Take 1 tablet (40 mg total) by mouth at bedtime.     colchicine 0.6 MG tablet Take 1 tablet by mouth as needed.     DULoxetine HCl 30 MG CSDR Take 30 mg by mouth 2 (two) times daily.     empagliflozin (JARDIANCE) 25 MG TABS tablet Take 25 mg by mouth daily. Takes a half tablet in the morning     ezetimibe (ZETIA) 10 MG tablet Take 1 tablet by mouth once daily. 90 tablet 3   fish oil-omega-3 fatty acids 1000 MG capsule Take 1 capsule (1 g total) by mouth daily. 60 capsule 1   ibuprofen (ADVIL) 400 MG tablet Take 400 mg by mouth 2 (two) times daily as needed.     levothyroxine (SYNTHROID) 25 MCG tablet Take 25 mcg by mouth daily.     lisinopril (ZESTRIL) 2.5 MG tablet Take 2.5 mg by mouth daily.     metFORMIN (GLUCOPHAGE) 500 MG tablet Take 1 tablet (500 mg total) by mouth 2 (two) times daily with a meal.     metoprolol tartrate (LOPRESSOR) 25 MG  tablet Take 1 tablet by mouth two times daily. PATIENT NEEDS TO SCHEDULE AND OFFICE VISIT FOR FUTURE REFILLS. 60 tablet 11   Multiple Vitamin (MULTIVITAMIN) tablet Take 1 tablet by mouth daily. 30 tablet 10   nitroGLYCERIN (NITROSTAT) 0.4 MG SL tablet Place 0.4 mg under the tongue every 2 (two) hours as needed for chest pain.     oxyCODONE (OXY IR/ROXICODONE) 5 MG immediate release tablet Take 1 tablet (5 mg total) by mouth every 4 (four) hours as needed for severe pain. 30 tablet 0   pantoprazole (PROTONIX) 40 MG tablet Take 1 tablet (40 mg total) by mouth daily. 30 tablet 11   pregabalin (LYRICA) 150 MG capsule Take 2 capsules (300 mg total) by mouth 2 (two) times daily.     Propylene Glycol (SYSTANE BALANCE) 0.6 % SOLN Place 1 drop into both eyes 3 (three) times daily as needed (dry eyes).     Semaglutide,0.25 or 0.5MG /DOS, (OZEMPIC, 0.25 OR 0.5 MG/DOSE,) 2 MG/3ML SOPN Inject 0.25 mg into the skin once a week 6 mL 0   Semaglutide,0.25 or 0.5MG /DOS, (OZEMPIC, 0.25 OR 0.5 MG/DOSE,) 2 MG/3ML SOPN Inject 0.25 mg into the skin once a week 12 mL 0   sildenafil (VIAGRA) 100 MG tablet Take 0.5-1 tablets (50-100 mg total) by mouth daily as needed for erectile dysfunction. 5 tablet 11   tamsulosin (FLOMAX) 0.4 MG CAPS capsule Take 1 capsule by mouth daily.     No current facility-administered medications for this visit.    Allergies:   Patient has no known allergies.    Social History:  The patient  reports that he quit smoking about 14 years ago. His smoking use included cigarettes. He has a 18.00 pack-year smoking history. He has never used smokeless tobacco. He reports that he does not currently use alcohol. He reports that he does not currently use drugs.   Family History:  The patient's family history includes Cancer in his father and sister; Heart attack in his father.    ROS:  Please see the history of present illness.   Otherwise, review of systems are positive for none.   All other systems  are reviewed and negative.    PHYSICAL EXAM: VS:  There were no vitals taken for this visit. , BMI There is no height or weight on file to calculate BMI.  GEN: Well nourished, well developed, in no acute distress  HEENT: normal  Neck: no JVD, carotid bruits, or masses Cardiac: RRR; no murmurs, rubs, or gallops,no edema  Respiratory:  clear to auscultation bilaterally, normal work of breathing GI: soft, nontender, nondistended, + BS MS: no deformity or atrophy  Skin: warm and dry, no rash Neuro:  Strength and sensation are intact Psych: euthymic mood, full affect Femoral: +2 bilaterally.  EKG:  EKG is not ordered today.   Recent Labs: 07/05/2022: ALT 26; BUN 16; Creatinine, Ser 0.95; Hemoglobin 13.1; Platelets 304; Potassium 4.1; Sodium 136    Lipid Panel    Component Value Date/Time   CHOL 104 03/04/2021 0535   CHOL 107 08/09/2020 1549   TRIG 87 03/04/2021 0535   HDL 36 (L) 03/04/2021 0535   HDL 33 (L) 08/09/2020 1549   CHOLHDL 2.9 03/04/2021 0535   VLDL 17 03/04/2021 0535   LDLCALC 51 03/04/2021 0535   LDLCALC 49 08/09/2020 1549   LDLDIRECT 80 11/06/2013 0925      Wt Readings from Last 3 Encounters:  07/17/22 218 lb 3.2 oz (99 kg)  07/05/22 230 lb (104.3 kg)  04/03/22 228 lb (103.4 kg)        ASSESSMENT AND PLAN:   1. Peripheral arterial disease: Previous bilateral SFA intervention in 2014 in 2015 and most recently right common femoral artery endarterectomy in 2022 . He has bilateral numbness likely related to his known history of peripheral neuropathy.  He does have evidence of borderline restenosis in the right SFA stent on most recent Doppler studies in April.  However, his right calf claudication is minimal at the present time.  No indication for angiography and revascularization.  The plan is to repeat his vascular studies in April 2024.  2. Peripheral diabetic neuropathy: Currently on Lyrica with improvement in symptoms.  3. Coronary artery disease  involving native coronary arteries with stable angina: Chronic atypical chest pain.  His symptoms are overall stable.    4. Essential hypertension: Blood pressure is controlled on current medications.  5. Hyperlipidemia: Most recent lipid profile from 2022 showed an LDL of 51.  His LDL is at target on atorvastatin and ezetimibe.     Disposition:   FU with me in 6 months.  Signed,  Kathlyn Sacramento, MD  07/21/2022 8:06 AM    [place]

## 2022-07-21 NOTE — Patient Instructions (Signed)
Medication Instructions:  No changes *If you need a refill on your cardiac medications before your next appointment, please call your pharmacy*   Lab Work: None ordered If you have labs (blood work) drawn today and your tests are completely normal, you will receive your results only by: MyChart Message (if you have MyChart) OR A paper copy in the mail If you have any lab test that is abnormal or we need to change your treatment, we will call you to review the results.   Testing/Procedures: None ordered   Follow-Up: At [facility], you and your health needs are our priority.  As part of our continuing mission to provide you with exceptional heart care, we have created designated Provider Care Teams.  These Care Teams include your primary Cardiologist (physician) and Advanced Practice Providers (APPs -  Physician Assistants and Nurse Practitioners) who all work together to provide you with the care you need, when you need it.  We recommend signing up for the patient portal called "MyChart".  Sign up information is provided on this After Visit Summary.  MyChart is used to connect with patients for Virtual Visits (Telemedicine).  Patients are able to view lab/test results, encounter notes, upcoming appointments, etc.  Non-urgent messages can be sent to your provider as well.   To learn more about what you can do with MyChart, go to [email or web address].    Your next appointment:   6 month(s)  The format for your next appointment:   In Person  Provider:   Dr. Arida  Important Information About Sugar       

## 2022-07-28 ENCOUNTER — Other Ambulatory Visit (HOSPITAL_COMMUNITY): Payer: Self-pay

## 2022-07-31 ENCOUNTER — Ambulatory Visit
Admission: RE | Admit: 2022-07-31 | Discharge: 2022-07-31 | Disposition: A | Discharge status: Home / Self Care | Payer: Self-pay | Source: Ambulatory Visit | Attending: Radiation Oncology | Admitting: Radiation Oncology

## 2022-07-31 ENCOUNTER — Other Ambulatory Visit: Payer: Self-pay | Admitting: Radiation Oncology

## 2022-07-31 DIAGNOSIS — D3A090 Benign carcinoid tumor of the bronchus and lung: Secondary | ICD-10-CM

## 2022-08-07 ENCOUNTER — Other Ambulatory Visit: Payer: Self-pay

## 2022-08-07 MED ORDER — PANTOPRAZOLE SODIUM 40 MG PO TBEC: 40.0000 mg | DELAYED_RELEASE_TABLET | Freq: Every day | ORAL | 1 refills | Status: DC

## 2022-08-07 MED ORDER — METOPROLOL TARTRATE 25 MG PO TABS: 25.0000 mg | ORAL_TABLET | Freq: Two times a day (BID) | ORAL | 1 refills | Status: DC

## 2022-08-07 MED ORDER — EZETIMIBE 10 MG PO TABS: 10.0000 mg | ORAL_TABLET | Freq: Every day | ORAL | 1 refills | Status: DC

## 2022-10-05 ENCOUNTER — Other Ambulatory Visit: Payer: Self-pay | Admitting: Cardiovascular Disease

## 2022-10-06 ENCOUNTER — Other Ambulatory Visit: Payer: Self-pay | Admitting: Cardiovascular Disease

## 2022-10-06 NOTE — Telephone Encounter (Signed)
Refill request

## 2022-10-19 ENCOUNTER — Other Ambulatory Visit (HOSPITAL_COMMUNITY): Payer: Self-pay

## 2022-10-19 MED ORDER — TADALAFIL 5 MG PO TABS
5.0000 mg | ORAL_TABLET | Freq: Every day | ORAL | 11 refills | Status: DC
  Filled 2022-10-19: qty 30, 30d supply, fill #0
  Filled 2022-12-09: qty 30, 30d supply, fill #1
  Filled 2023-01-31 – 2023-06-16 (×4): qty 30, 30d supply, fill #2

## 2022-11-14 ENCOUNTER — Other Ambulatory Visit (HOSPITAL_COMMUNITY): Payer: Self-pay

## 2022-11-14 MED ORDER — SODIUM FLUORIDE 1.1 % DT PSTE
PASTE | DENTAL | 1 refills | Status: DC
  Filled 2022-11-14: qty 100, 30d supply, fill #0
  Filled 2023-04-07: qty 100, 30d supply, fill #1

## 2022-11-16 ENCOUNTER — Other Ambulatory Visit: Payer: Self-pay | Admitting: Cardiovascular Disease

## 2022-11-17 NOTE — Telephone Encounter (Signed)
Refill request

## 2022-11-20 ENCOUNTER — Emergency Department (HOSPITAL_COMMUNITY)
Admission: EM | Admit: 2022-11-20 | Discharge: 2022-11-20 | Disposition: A | Discharge status: Home / Self Care | Payer: No Typology Code available for payment source | Attending: Emergency Medicine | Admitting: Emergency Medicine

## 2022-11-20 ENCOUNTER — Other Ambulatory Visit (HOSPITAL_COMMUNITY): Payer: Self-pay

## 2022-11-20 DIAGNOSIS — I10 Essential (primary) hypertension: Secondary | ICD-10-CM | POA: Diagnosis not present

## 2022-11-20 DIAGNOSIS — H5789 Other specified disorders of eye and adnexa: Secondary | ICD-10-CM | POA: Diagnosis present

## 2022-11-20 DIAGNOSIS — Z8507 Personal history of malignant neoplasm of pancreas: Secondary | ICD-10-CM | POA: Diagnosis not present

## 2022-11-20 DIAGNOSIS — Z7982 Long term (current) use of aspirin: Secondary | ICD-10-CM | POA: Diagnosis not present

## 2022-11-20 DIAGNOSIS — Z7984 Long term (current) use of oral hypoglycemic drugs: Secondary | ICD-10-CM | POA: Diagnosis not present

## 2022-11-20 DIAGNOSIS — E119 Type 2 diabetes mellitus without complications: Secondary | ICD-10-CM | POA: Insufficient documentation

## 2022-11-20 DIAGNOSIS — Z79899 Other long term (current) drug therapy: Secondary | ICD-10-CM | POA: Diagnosis not present

## 2022-11-20 MED ORDER — MOXIFLOXACIN HCL 0.5 % OP SOLN
1.0000 [drp] | Freq: Three times a day (TID) | OPHTHALMIC | 0 refills | Status: DC
  Filled 2022-11-20: qty 3, 10d supply, fill #0

## 2022-11-20 MED ORDER — FLUORESCEIN SODIUM 1 MG OP STRP
1.0000 | ORAL_STRIP | Freq: Once | OPHTHALMIC | Status: AC
  Administered 2022-11-20: 1 via OPHTHALMIC
  Filled 2022-11-20: qty 1

## 2022-11-20 MED ORDER — TETRACAINE HCL 0.5 % OP SOLN
1.0000 [drp] | Freq: Once | OPHTHALMIC | Status: AC
  Administered 2022-11-20: 1 [drp] via OPHTHALMIC
  Filled 2022-11-20: qty 4

## 2022-11-20 NOTE — ED Notes (Signed)
Pt visual acuity: 20/40 in right eye and 20/40 in left eye.

## 2022-11-20 NOTE — Discharge Instructions (Signed)
You may take Tylenol or ibuprofen available over-the-counter according to label instructions as needed for pain.  You may also apply cool compresses to the affected eye.  Get rechecked if you have a change in your vision, worsening pain or new concerning symptoms.

## 2022-11-20 NOTE — ED Triage Notes (Signed)
Pt presents with c/o crust in eye, went to wipe it around 3a and feels like a piece got stuck in his eye. Has attempted to flush with no success.

## 2022-11-20 NOTE — ED Provider Notes (Signed)
[place] DEPT Provider Note   CSN: [id number] Arrival date & time: 11/20/22  0415     History  Chief Complaint  Patient presents with   Eye Problem    Steven Sanon. is a 67 y.o. male.  The history is provided by the patient.  Eye Problem Steven Hale. is a 67 y.o. male who presents to the Emergency Department complaining of eye irritation.  He presents emergency department for evaluation of discomfort and irritation to the left eye.  He was fine when he went to bed and when he woke up he had some crusting to his eyes and went to rub it and after that he felt like a foreign body sensation in the eye with discomfort and blurring to the left eye.  He does use Systane eyedrops at night.  He uses glasses, no contact lenses.  He has a history of pancreatic cancer, peripheral arterial disease, diabetes, hypertension.      Home Medications Prior to Admission medications   Medication Sig Start Date End Date Taking? Authorizing Provider  aspirin EC 81 MG tablet Take 1 tablet (81 mg total) by mouth daily. 11/19/14   Danae Orleans, PA-C  atorvastatin (LIPITOR) 40 MG tablet Take 1 tablet (40 mg total) by mouth at bedtime. 02/11/22   Gold, Patrick Jupiter E, PA-C  colchicine 0.6 MG tablet Take 1 tablet by mouth as needed. 07/09/22   [provider]  DULoxetine HCl 30 MG CSDR Take 30 mg by mouth 2 (two) times daily.    [provider]  empagliflozin (JARDIANCE) 25 MG TABS tablet Take by mouth daily. Takes a half tablet in the morning    [provider]  ezetimibe (ZETIA) 10 MG tablet TAKE 1 TABLET BY MOUTH DAILY 11/17/22   Wellington Hampshire, MD  fish oil-omega-3 fatty acids 1000 MG capsule Take 1 capsule (1 g total) by mouth daily. 11/30/12   Rosana Hoes, MD  ibuprofen (ADVIL) 400 MG tablet Take 400 mg by mouth 2 (two) times daily as needed. 04/14/22   [provider]  levothyroxine (SYNTHROID) 25 MCG tablet Take 25 mcg by mouth daily.  01/30/19   [provider]  lisinopril (ZESTRIL) 2.5 MG tablet Take 2.5 mg by mouth daily. 08/19/20   [provider]  metFORMIN (GLUCOPHAGE) 500 MG tablet Take 1 tablet (500 mg total) by mouth 2 (two) times daily with a meal. 02/11/22   Gold, Wayne E, PA-C  metoprolol tartrate (LOPRESSOR) 25 MG tablet Take 1 tablet (25 mg total) by mouth 2 (two) times daily. Please contact the office to schedule appointment for additional refills. 10/06/22   Wellington Hampshire, MD  Multiple Vitamin (MULTIVITAMIN) tablet Take 1 tablet by mouth daily. 11/30/12   Rosana Hoes, MD  nitroGLYCERIN (NITROSTAT) 0.4 MG SL tablet Place 0.4 mg under the tongue every 2 (two) hours as needed for chest pain. 10/28/16   [provider]  oxyCODONE (OXY IR/ROXICODONE) 5 MG immediate release tablet Take 1 tablet (5 mg total) by mouth every 4 (four) hours as needed for severe pain. 02/19/22   Lajuana Matte, MD  pantoprazole (PROTONIX) 40 MG tablet TAKE 1 TABLET BY MOUTH DAILY 10/06/22   Wellington Hampshire, MD  pregabalin (LYRICA) 150 MG capsule Take 2 capsules (300 mg total) by mouth 2 (two) times daily. 02/11/22   Gold, Wilder Glade, PA-C  Propylene Glycol (SYSTANE BALANCE) 0.6 % SOLN Place 1 drop into both eyes 3 (three) times daily as  needed (dry eyes).    [provider]  Semaglutide,0.25 or 0.5MG /DOS, (OZEMPIC, 0.25 OR 0.5 MG/DOSE,) 2 MG/3ML SOPN Inject 0.25 mg into the skin once a week 01/27/22     Semaglutide,0.25 or 0.5MG /DOS, (OZEMPIC, 0.25 OR 0.5 MG/DOSE,) 2 MG/3ML SOPN Inject 0.25 mg into the skin once a week 03/09/22     sildenafil (VIAGRA) 100 MG tablet Take 0.5-1 tablets (50-100 mg total) by mouth daily as needed for erectile dysfunction. 08/25/18   Horald Pollen, MD  Sodium Fluoride 1.1 % PSTE Use as directed 11/13/22     tadalafil (CIALIS) 5 MG tablet Take 1 tablet (5 mg total) by mouth daily. 10/19/22     tamsulosin (FLOMAX) 0.4 MG CAPS capsule Take 1 capsule by mouth daily. 06/30/22    [provider]      Allergies    Patient has no known allergies.    Review of Systems   Review of Systems  All other systems reviewed and are negative.   Physical Exam Updated Vital Signs BP (!) 144/75 (BP Location: Right Arm)   Pulse 88   Temp 98.4 F (36.9 C) (Oral)   Resp 18   Ht 5\' 9"  (1.753 m)   Wt 97.5 kg   SpO2 98%   BMI 31.75 kg/m  Physical Exam Vitals and nursing note reviewed.  Constitutional:      Appearance: He is well-developed.  HENT:     Head: Normocephalic and atraumatic.     Comments: Pupils equal round and reactive, EOMI.  There is mild left conjunctival injection.  There is no periorbital edema.  No uptake on fluorescein staining. Cardiovascular:     Rate and Rhythm: Normal rate and regular rhythm.     Heart sounds: No murmur heard. Pulmonary:     Effort: Pulmonary effort is normal. No respiratory distress.  Musculoskeletal:        General: No tenderness.  Skin:    General: Skin is warm and dry.  Neurological:     Mental Status: He is alert and oriented to person, place, and time.  Psychiatric:        Behavior: Behavior normal.     ED Results / Procedures / Treatments   Labs (all labs ordered are listed, but only abnormal results are displayed) Labs Reviewed - No data to display  EKG None  Radiology No results found.  Procedures Procedures    Medications Ordered in ED Medications  fluorescein ophthalmic strip 1 strip (1 strip Left Eye Given 11/20/22 0642)  tetracaine (PONTOCAINE) 0.5 % ophthalmic solution 1 drop (1 drop Left Eye Given 11/20/22 1194)    ED Course/ Medical Decision Making/ A&P                           Medical Decision Making  Patient here for evaluation of eye discomfort after rubbing his eye.  No evidence of corneal abrasion on examination.  Vision is 20/40 bilaterally.  No evidence of acute infectious process at this time.  He did have pain relief after topical drops.  Suspect that he has irritation  from irrigating his eye as well as rubbing his eye.  Discussed cool compresses, OTC analgesics as needed with return precautions for progressive or concerning symptoms.        Final Clinical Impression(s) / ED Diagnoses Final diagnoses:  Eye irritation    Rx / DC Orders ED Discharge Orders     None  Quintella Reichert, MD 11/20/22 [phone]

## 2022-12-09 ENCOUNTER — Other Ambulatory Visit (HOSPITAL_COMMUNITY): Payer: Self-pay

## 2022-12-09 ENCOUNTER — Other Ambulatory Visit: Payer: Self-pay | Admitting: Thoracic Surgery (Cardiothoracic Vascular Surgery)

## 2022-12-10 ENCOUNTER — Other Ambulatory Visit (HOSPITAL_COMMUNITY): Payer: Self-pay

## 2022-12-10 ENCOUNTER — Other Ambulatory Visit: Payer: Self-pay

## 2022-12-10 MED ORDER — SEMAGLUTIDE(0.25 OR 0.5MG/DOS) 2 MG/3ML ~~LOC~~ SOPN
0.2500 mg | PEN_INJECTOR | SUBCUTANEOUS | 0 refills | Status: DC
  Filled 2022-12-10: qty 3, 56d supply, fill #0
  Filled 2023-01-31: qty 6, 56d supply, fill #0
  Filled 2023-04-07: qty 3, 28d supply, fill #0
  Filled 2023-04-19 – 2023-04-29 (×2): qty 3, 28d supply, fill #1

## 2022-12-11 ENCOUNTER — Other Ambulatory Visit (HOSPITAL_COMMUNITY): Payer: Self-pay

## 2022-12-16 ENCOUNTER — Other Ambulatory Visit (HOSPITAL_COMMUNITY): Payer: Self-pay

## 2022-12-16 MED ORDER — TADALAFIL 5 MG PO TABS
5.0000 mg | ORAL_TABLET | Freq: Every morning | ORAL | 3 refills | Status: DC
  Filled 2022-12-16: qty 90, 90d supply, fill #0
  Filled 2023-01-31 – 2023-04-19 (×3): qty 90, 90d supply, fill #1
  Filled 2023-07-18 – 2023-07-24 (×2): qty 90, 90d supply, fill #2
  Filled 2023-08-02 – 2023-10-18 (×2): qty 90, 90d supply, fill #3

## 2022-12-16 MED ORDER — TAMSULOSIN HCL 0.4 MG PO CAPS
0.4000 mg | ORAL_CAPSULE | Freq: Every day | ORAL | 3 refills | Status: DC
  Filled 2022-12-16: qty 90, 90d supply, fill #0
  Filled 2023-04-07 – 2023-04-19 (×2): qty 90, 90d supply, fill #1
  Filled 2023-06-03 – 2023-07-24 (×4): qty 90, 90d supply, fill #2
  Filled 2023-12-02: qty 90, 90d supply, fill #3

## 2022-12-16 MED ORDER — SILDENAFIL CITRATE 100 MG PO TABS
ORAL_TABLET | ORAL | 5 refills | Status: DC
  Filled 2022-12-16: qty 30, 30d supply, fill #0
  Filled 2023-01-31 – 2023-04-07 (×2): qty 30, 30d supply, fill #1

## 2022-12-24 ENCOUNTER — Other Ambulatory Visit: Payer: Self-pay | Admitting: Cardiovascular Disease

## 2022-12-25 NOTE — Telephone Encounter (Signed)
Refill request

## 2023-01-05 ENCOUNTER — Other Ambulatory Visit (HOSPITAL_COMMUNITY): Payer: Self-pay

## 2023-01-31 ENCOUNTER — Other Ambulatory Visit: Payer: Self-pay | Admitting: Thoracic Surgery (Cardiothoracic Vascular Surgery)

## 2023-02-01 ENCOUNTER — Other Ambulatory Visit (HOSPITAL_COMMUNITY): Payer: Self-pay

## 2023-02-01 ENCOUNTER — Other Ambulatory Visit: Payer: Self-pay

## 2023-02-05 ENCOUNTER — Other Ambulatory Visit: Payer: Self-pay

## 2023-02-15 ENCOUNTER — Other Ambulatory Visit (HOSPITAL_COMMUNITY): Payer: Self-pay

## 2023-02-19 ENCOUNTER — Other Ambulatory Visit: Payer: Self-pay | Admitting: Surgical

## 2023-02-19 DIAGNOSIS — I739 Peripheral vascular disease, unspecified: Secondary | ICD-10-CM

## 2023-02-19 DIAGNOSIS — E7800 Pure hypercholesterolemia, unspecified: Secondary | ICD-10-CM

## 2023-02-19 DIAGNOSIS — E1142 Type 2 diabetes mellitus with diabetic polyneuropathy: Secondary | ICD-10-CM

## 2023-03-01 ENCOUNTER — Telehealth: Payer: Self-pay | Admitting: Pulmonary Disease

## 2023-03-01 NOTE — Telephone Encounter (Signed)
PFT results have been faxed to provided fax number. Nothing further needed.

## 2023-03-01 NOTE — Telephone Encounter (Signed)
Steven Hale would like results of PFT 02/23/2022 for [facility]. Phone number is [phone]. Fax number is [phone].

## 2023-03-04 ENCOUNTER — Other Ambulatory Visit: Payer: Self-pay | Admitting: Cardiovascular Disease

## 2023-03-11 ENCOUNTER — Other Ambulatory Visit (HOSPITAL_COMMUNITY): Payer: Self-pay

## 2023-03-11 MED ORDER — MUPIROCIN 2 % EX OINT
TOPICAL_OINTMENT | CUTANEOUS | 0 refills | Status: DC
  Filled 2023-03-11: qty 22, 7d supply, fill #0

## 2023-03-11 MED ORDER — CHLORHEXIDINE GLUCONATE 4 % EX SOLN
CUTANEOUS | 0 refills | Status: DC
  Filled 2023-04-07: qty 237, 1d supply, fill #0

## 2023-03-16 ENCOUNTER — Other Ambulatory Visit (HOSPITAL_COMMUNITY): Payer: Self-pay

## 2023-03-16 MED ORDER — HYDROCODONE-ACETAMINOPHEN 5-325 MG PO TABS
ORAL_TABLET | ORAL | 0 refills | Status: DC
  Filled 2023-03-16: qty 20, 5d supply, fill #0

## 2023-03-16 MED ORDER — CEPHALEXIN 500 MG PO CAPS
500.0000 mg | ORAL_CAPSULE | Freq: Four times a day (QID) | ORAL | 0 refills | Status: DC
  Filled 2023-03-16: qty 4, 1d supply, fill #0

## 2023-03-17 ENCOUNTER — Other Ambulatory Visit (HOSPITAL_COMMUNITY): Payer: Self-pay

## 2023-03-29 ENCOUNTER — Other Ambulatory Visit: Payer: Self-pay | Admitting: Cardiovascular Disease

## 2023-03-30 ENCOUNTER — Other Ambulatory Visit (HOSPITAL_COMMUNITY): Payer: Self-pay

## 2023-03-30 MED ORDER — SULFAMETHOXAZOLE-TRIMETHOPRIM 800-160 MG PO TABS
1.0000 | ORAL_TABLET | Freq: Two times a day (BID) | ORAL | 0 refills | Status: DC
  Filled 2023-03-30: qty 20, 10d supply, fill #0

## 2023-03-30 NOTE — Telephone Encounter (Signed)
Refill request

## 2023-04-05 NOTE — Progress Notes (Unsigned)
Cardiology Office Note   Date:  04/06/2023   ID:  Steven Decoteau., DOB 1956-06-07, MRN [id number]  PCP:  Brynda Peon, MD  Cardiologist:  Dr. Lysle Rubens  No chief complaint on file.     History of Present Illness: Steven Hale. is a 67 y.o. male who presents for a followup visit regarding peripheral arterial disease and coronary artery disease. He has known history of coronary artery disease with multiple interventions in the past. He had CABG in 2011.  He quit smoking in 2009.  He had pituitary adenoma resection. He has known history of peripheral arterial disease with intervention on both SFAs. Cardiac catheterization in January, 2017 showed mild nonobstructive LAD disease, occluded mid left circumflex and occluded proximal right coronary artery with left-to-right collaterals. SVG to RCA was occluded and LIMA to LAD was atretic. SVG to OM was normal. Abnormal stress test was due to chronically occluded right coronary artery and graft with left-to-right collaterals. His native RCA was not favorable for CTO PCI. I recommended medical therapy. Imdur was added.   He had recurrent right leg claudication in 2022.  Angiography in April of 2022  showed no significant aortoiliac disease.  On the right side, there was severe calcified stenosis in the common femoral artery with patent SFA stent with moderate in-stent restenosis and two-vessel runoff below the knee.  On the left side, there was mild common femoral artery disease, patent SFA stent with minimal restenosis and two-vessel runoff below the knee. The patient underwent right common femoral artery endarterectomy by Dr. Lenell Antu in April 2022.  He had lobectomy done in March of 2023 by Dr. Cliffton Asters and was diagnosed with carcinoid tumor.   Regarding his peripheral arterial disease, most recent noninvasive vascular studies in April of 2023 showed an ABI of 0.88 on the right and 1.04 on the left.  Duplex showed significant restenosis  in the right SFA stent with peak velocity of 384.    He reports worsening right calf claudication.  He has been trying to exercise on a treadmill and the pain started shortly after walking at 2.5 mph.  He is also bothered by peripheral neuropathy with chronic numbness.  In addition, he has chronic low back pain. No chest pain or worsening dyspnea.  Past Medical History:  Diagnosis Date   Anemia, unspecified    Bell's palsy    resolved - no deficits   Blood transfusion    during treatment for Ca   Blood transfusion without reported diagnosis    CAD (coronary artery disease)    a. s/p multiple PCIs;  b. s/p CABG in 09/2010 (LIMA-LAD, SVG-OM1, SVG-distal RCA/OM2);  Cardiac cath in 12/2015: Mild LAD disease, occluded mid LCX and proximal RCA (left to right collaterals), Occluded SVG to RCA and atretic LIMA. Patent SVG to OM   Chronic back pain    Diabetes mellitus without complication (HCC)    type 2   DJD (degenerative joint disease)    low back & all over    GERD (gastroesophageal reflux disease)    Helicobacter pylori (H. pylori) infection    Hemorrhoids    History of shingles    Hyperlipidemia    Hypertension    Impotence of organic origin    Myocardial infarction (HCC)    2005 and 2012    PAD (peripheral artery disease) (HCC)    a. s/p bilat SFA stents;  b. ABIs 11/2012: R 0.99, L 0.86.   Pancreatic cancer (HCC)  surgery 2009 cyst removed   Pituitary adenoma [facility])    surgery at [facility] Feb 2020   Sleep apnea 06/05/2019   does not use cpap   Substance abuse (HCC)    drug and alcohol abuse    Past Surgical History:  Procedure Laterality Date   ABDOMINAL AORTAGRAM N/A 09/13/2013   Procedure: ABDOMINAL Ronny Flurry;  Surgeon: Iran Ouch, MD;  [facility]: [facility];  Service: Cardiovascular;  Laterality: N/A;   ABDOMINAL AORTAGRAM N/A 08/29/2014   Procedure: ABDOMINAL Ronny Flurry;  Surgeon: Iran Ouch, MD;  [facility]: [facility];  Service: Cardiovascular;  Laterality:  N/A;   ABDOMINAL AORTOGRAM W/LOWER EXTREMITY N/A 02/19/2021   Procedure: ABDOMINAL AORTOGRAM W/LOWER EXTREMITY;  Surgeon: Iran Ouch, MD;  [facility]: [facility] INVASIVE CV LAB;  Service: Cardiovascular;  Laterality: N/A;   BACK SURGERY     1992   BRONCHIAL BIOPSY  01/13/2022   Procedure: BRONCHIAL BIOPSIES;  Surgeon: Josephine Igo, DO;  Location: [facility] ENDOSCOPY;  Service: Pulmonary;;   BRONCHIAL BRUSHINGS  01/13/2022   Procedure: BRONCHIAL BRUSHINGS;  Surgeon: Josephine Igo, DO;  [facility]: [facility];  Service: Pulmonary;;   BRONCHIAL NEEDLE ASPIRATION BIOPSY  01/13/2022   Procedure: BRONCHIAL NEEDLE ASPIRATION BIOPSIES;  Surgeon: Josephine Igo, DO;  [facility];  Service: Pulmonary;;   CARDIAC CATHETERIZATION N/A 12/25/2015   Procedure: Left Heart Cath and Cors/Grafts Angiography;  Surgeon: Iran Ouch, MD;  [facility]: [facility];  Service: Cardiovascular;  Laterality: N/A;   COLONOSCOPY     CORONARY ARTERY BYPASS GRAFT  nov 2011   x 4   ENDARTERECTOMY FEMORAL Right 03/03/2021   Procedure: RIGHT FEMORAL ENDARTERECTOMY;  Surgeon: Leonie Douglas, MD;  Location: [facility] OR;  Service: Vascular;  Laterality: Right;   ESOPHAGOGASTRODUODENOSCOPY (EGD) WITH PROPOFOL N/A 04/02/2017   Procedure: ESOPHAGOGASTRODUODENOSCOPY (EGD) WITH PROPOFOL;  Surgeon: Jeani Hawking, MD;  Location: [facility] ENDOSCOPY;  Service: Endoscopy;  Laterality: N/A;   HEMORRHOID SURGERY  10/30/2011   Procedure: HEMORRHOIDECTOMY;  Surgeon: Shelly Rubenstein, MD;  Location: [facility] OR;  Service: General;  Laterality: N/A;   JOINT REPLACEMENT     pancreatic  cancer  05/10/2008   Resection of distal panrease and spleen   PARTIAL KNEE ARTHROPLASTY Right 11/19/2014   Procedure: RIGHT UNI-KNEE ARTHROPLASTY MEDIALLY;  Surgeon: Shelda Pal, MD;  Location: [facility] ORS;  Service: Orthopedics;  Laterality: Right;   PATCH ANGIOPLASTY Right 03/03/2021   Procedure: PATCH ANGIOPLASTY;  Surgeon: Leonie Douglas, MD;  Location: [facility] OR;   Service: Vascular;  Laterality: Right;   PERIPHERAL VASCULAR CATHETERIZATION  Oct 2014   Lt SFA PTA   PERIPHERAL VASCULAR CATHETERIZATION  Oct 2015   ISR Lt SFA-PTA   right partial knee replacement     splenectomy     THROMBECTOMY FEMORAL ARTERY Left 09/13/2013   Procedure: THROMBECTOMY FEMORAL ARTERY;  Surgeon: Iran Ouch, MD;  [facility];  Service: Cardiovascular;  Laterality: Left;   TRANSPHENOIDAL / TRANSNASAL HYPOPHYSECTOMY / RESECTION PITUITARY TUMOR  12/2018   [facility]   VIDEO BRONCHOSCOPY N/A 02/09/2022   Procedure: VIDEO BRONCHOSCOPY;  Surgeon: Corliss Skains, MD;  Location: [facility] OR;  Service: Thoracic;  Laterality: N/A;   VIDEO BRONCHOSCOPY WITH RADIAL ENDOBRONCHIAL ULTRASOUND  01/13/2022   Procedure: RADIAL ENDOBRONCHIAL ULTRASOUND;  Surgeon: Josephine Igo, DO;  [facility];  Service: Pulmonary;;     Current Outpatient Medications  Medication Sig Dispense Refill   aspirin EC 81 MG tablet Take 1  tablet (81 mg total) by mouth daily.     atorvastatin (LIPITOR) 40 MG tablet Take 1 tablet (40 mg total) by mouth at bedtime.     cephALEXin (KEFLEX) 500 MG capsule Take 1 capsule (500 mg total) by mouth every 6 (six) hours for a total of 4 doses beginning this afternoon. 4 capsule 0   chlorhexidine (HIBICLENS) 4 % external liquid Apply topically over lower back and buttocks towards the end of showering for 7 days leading up to, and including the day of planned surgery 120 mL 0   colchicine 0.6 MG tablet Take 1 tablet by mouth as needed.     DULoxetine HCl 30 MG CSDR Take 30 mg by mouth 2 (two) times daily.     empagliflozin (JARDIANCE) 25 MG TABS tablet Take by mouth daily. Takes a half tablet in the morning     ezetimibe (ZETIA) 10 MG tablet TAKE 1 TABLET BY MOUTH DAILY 100 tablet 2   fish oil-omega-3 fatty acids 1000 MG capsule Take 1 capsule (1 g total) by mouth daily. 60 capsule 1   ibuprofen (ADVIL) 400 MG tablet Take 400 mg by mouth 2 (two) times daily  as needed.     levothyroxine (SYNTHROID) 25 MCG tablet Take 25 mcg by mouth daily.     lisinopril (ZESTRIL) 2.5 MG tablet Take 2.5 mg by mouth daily.     metFORMIN (GLUCOPHAGE) 500 MG tablet Take 1 tablet (500 mg total) by mouth 2 (two) times daily with a meal.     metoprolol tartrate (LOPRESSOR) 25 MG tablet TAKE 1 TABLET BY MOUTH TWICE  DAILY . 90 tablet 3   moxifloxacin (VIGAMOX) 0.5 % ophthalmic solution Place 1 drop into affected eye(s) 3 (three) times daily. 3 mL 0   Multiple Vitamin (MULTIVITAMIN) tablet Take 1 tablet by mouth daily. 30 tablet 10   mupirocin ointment (BACTROBAN) 2 % Apply and massage in one gram (pea sized amount) inside each nostril twice a day for 5 days prior to, and the day of, surgical procedure 22 g 0   nitroGLYCERIN (NITROSTAT) 0.4 MG SL tablet Place 0.4 mg under the tongue every 2 (two) hours as needed for chest pain.     oxyCODONE (OXY IR/ROXICODONE) 5 MG immediate release tablet Take 1 tablet (5 mg total) by mouth every 4 (four) hours as needed for severe pain. 30 tablet 0   pantoprazole (PROTONIX) 40 MG tablet TAKE 1 TABLET BY MOUTH DAILY 90 tablet 3   pregabalin (LYRICA) 150 MG capsule Take 2 capsules (300 mg total) by mouth 2 (two) times daily.     Propylene Glycol (SYSTANE BALANCE) 0.6 % SOLN Place 1 drop into both eyes 3 (three) times daily as needed (dry eyes).     Semaglutide,0.25 or 0.5MG /DOS, (OZEMPIC, 0.25 OR 0.5 MG/DOSE,) 2 MG/3ML SOPN Inject 0.25 mg into the skin once a week 12 mL 0   Semaglutide,0.25 or 0.5MG /DOS, (OZEMPIC, 0.25 OR 0.5 MG/DOSE,) 2 MG/3ML SOPN Inject 0.25 mg into the skin once a week. 6 mL 0   sildenafil (VIAGRA) 100 MG tablet Take 0.5-1 tablets (50-100 mg total) by mouth daily as needed for erectile dysfunction. 5 tablet 11   sildenafil (VIAGRA) 100 MG tablet Take 1/2-1 tablet by mouth daily as needed 30-60 minutes prior to planned activity. 30 tablet 5   Sodium Fluoride 1.1 % PSTE Use as directed 100 mL 1    sulfamethoxazole-trimethoprim (BACTRIM DS) 800-160 MG tablet Take 1 tablet by mouth 2 (two) times daily for  10 days 20 tablet 0   tadalafil (CIALIS) 5 MG tablet Take 1 tablet (5 mg total) by mouth daily. 30 tablet 11   tadalafil (CIALIS) 5 MG tablet Take 1 tablet (5 mg total) by mouth in the morning. 90 tablet 3   tamsulosin (FLOMAX) 0.4 MG CAPS capsule Take 1 capsule by mouth daily.     tamsulosin (FLOMAX) 0.4 MG CAPS capsule Take 1 capsule (0.4 mg total) by mouth daily 30 minutes after evening meal. 90 capsule 3   No current facility-administered medications for this visit.    Allergies:   Patient has no known allergies.    Social History:  The patient  reports that he quit smoking about 15 years ago. His smoking use included cigarettes. He has a 18.00 pack-year smoking history. He has never used smokeless tobacco. He reports that he does not currently use alcohol. He reports that he does not currently use drugs.   Family History:  The patient's family history includes Cancer in his father and sister; Heart attack in his father.    ROS:  Please see the history of present illness.   Otherwise, review of systems are positive for none.   All other systems are reviewed and negative.    PHYSICAL EXAM: VS:  BP (!) 98/58   Pulse 66   Ht 5\' 9"  (1.753 m)   Wt 213 lb 9.6 oz (96.9 kg)   SpO2 92%   BMI 31.54 kg/m  , BMI Body mass index is 31.54 kg/m. GEN: Well nourished, well developed, in no acute distress  HEENT: normal  Neck: no JVD, carotid bruits, or masses Cardiac: RRR; no murmurs, rubs, or gallops,no edema  Respiratory:  clear to auscultation bilaterally, normal work of breathing GI: soft, nontender, nondistended, + BS MS: no deformity or atrophy  Skin: warm and dry, no rash Neuro:  Strength and sensation are intact Psych: euthymic mood, full affect Femoral: +2 bilaterally.  Distal pulses are palpable on the left but not on the right.  EKG:  EKG is ordered today. EKG showed  normal sinus rhythm with low voltage.   Recent Labs: 07/05/2022: ALT 26; BUN 16; Creatinine, Ser 0.95; Hemoglobin 13.1; Platelets 304; Potassium 4.1; Sodium 136    Lipid Panel    Component Value Date/Time   CHOL 104 03/04/2021 0535   CHOL 107 08/09/2020 1549   TRIG 87 03/04/2021 0535   HDL 36 (L) 03/04/2021 0535   HDL 33 (L) 08/09/2020 1549   CHOLHDL 2.9 03/04/2021 0535   VLDL 17 03/04/2021 0535   LDLCALC 51 03/04/2021 0535   LDLCALC 49 08/09/2020 1549   LDLDIRECT 80 11/06/2013 0925      Wt Readings from Last 3 Encounters:  04/06/23 213 lb 9.6 oz (96.9 kg)  11/20/22 215 lb (97.5 kg)  07/21/22 213 lb (96.6 kg)        ASSESSMENT AND PLAN:   1. Peripheral arterial disease: Previous bilateral SFA intervention in 2014 in 2015 and most recently right common femoral artery endarterectomy in 2022 .  He reports worsening right calf claudication currently Rutherford class III.  I requested a follow-up ABI and duplex.  Continue treatment of risk factors and a walking program.  2. Peripheral diabetic neuropathy: Currently on Lyrica with improvement in symptoms.  3. Coronary artery disease involving native coronary arteries with stable angina: Chronic atypical chest pain.  His symptoms are overall stable.    4. Essential hypertension: Blood pressure is controlled on current medications.  5. Hyperlipidemia: Most  recent lipid profile from 2022 showed an LDL of 51.  His LDL is at target on atorvastatin and ezetimibe.     Disposition:   FU with me in 6 months.  Signed,  Lorine Bears, MD  04/06/2023 9:22 AM    [facility]

## 2023-04-06 ENCOUNTER — Ambulatory Visit
Payer: No Typology Code available for payment source | Attending: Cardiovascular Disease | Admitting: Cardiovascular Disease

## 2023-04-06 VITALS — BP 98/58 | HR 66 | Ht 69.0 in | Wt 213.6 lb

## 2023-04-06 DIAGNOSIS — I10 Essential (primary) hypertension: Secondary | ICD-10-CM | POA: Diagnosis not present

## 2023-04-06 DIAGNOSIS — I25708 Atherosclerosis of coronary artery bypass graft(s), unspecified, with other forms of angina pectoris: Secondary | ICD-10-CM | POA: Diagnosis not present

## 2023-04-06 DIAGNOSIS — E785 Hyperlipidemia, unspecified: Secondary | ICD-10-CM

## 2023-04-06 DIAGNOSIS — I739 Peripheral vascular disease, unspecified: Secondary | ICD-10-CM | POA: Diagnosis not present

## 2023-04-06 NOTE — Patient Instructions (Signed)
Medication Instructions:  No changes *If you need a refill on your cardiac medications before your next appointment, please call your pharmacy*   Lab Work: None ordered If you have labs (blood work) drawn today and your tests are completely normal, you will receive your results only by: MyChart Message (if you have MyChart) OR A paper copy in the mail If you have any lab test that is abnormal or we need to change your treatment, we will call you to review the results.   Testing/Procedures: Your physician has requested that you have a lower extremity arterial duplex. During this test, ultrasound is used to evaluate arterial blood flow in the legs. Allow one hour for this exam. There are no restrictions or special instructions. This will take place at [address], [address].  Your physician has requested that you have an ankle brachial index (ABI). During this test an ultrasound and blood pressure cuff are used to evaluate the arteries that supply the arms and legs with blood. Allow thirty minutes for this exam. There are no restrictions or special instructions. This will take place at [address], [address].    Follow-Up: At [facility], you and your health needs are our priority.  As part of our continuing mission to provide you with exceptional heart care, we have created designated Provider Care Teams.  These Care Teams include your primary Cardiologist (physician) and Advanced Practice Providers (APPs -  Physician Assistants and Nurse Practitioners) who all work together to provide you with the care you need, when you need it.  We recommend signing up for the patient portal called "MyChart".  Sign up information is provided on this After Visit Summary.  MyChart is used to connect with patients for Virtual Visits (Telemedicine).  Patients are able to view lab/test results, encounter notes, upcoming appointments, etc.  Non-urgent messages can be sent to your provider as  well.   To learn more about what you can do with MyChart, go to [email or web address].    Your next appointment:   6 month(s)  Provider:   Dr. Arida  

## 2023-04-07 ENCOUNTER — Other Ambulatory Visit (HOSPITAL_COMMUNITY): Payer: Self-pay

## 2023-04-07 ENCOUNTER — Other Ambulatory Visit: Payer: Self-pay

## 2023-04-08 ENCOUNTER — Other Ambulatory Visit (HOSPITAL_COMMUNITY): Payer: Self-pay

## 2023-04-15 ENCOUNTER — Other Ambulatory Visit (HOSPITAL_COMMUNITY): Payer: Self-pay

## 2023-04-19 ENCOUNTER — Other Ambulatory Visit (HOSPITAL_COMMUNITY): Payer: Self-pay

## 2023-04-22 ENCOUNTER — Other Ambulatory Visit: Payer: Self-pay

## 2023-04-22 ENCOUNTER — Other Ambulatory Visit (HOSPITAL_COMMUNITY): Payer: Self-pay

## 2023-04-24 ENCOUNTER — Other Ambulatory Visit (HOSPITAL_COMMUNITY): Payer: Self-pay

## 2023-04-26 ENCOUNTER — Ambulatory Visit (HOSPITAL_COMMUNITY)
Admission: RE | Admit: 2023-04-26 | Discharge: 2023-04-26 | Disposition: A | Discharge status: Home / Self Care | Payer: 59 | Source: Ambulatory Visit | Attending: Cardiovascular Disease | Admitting: Cardiovascular Disease

## 2023-04-26 DIAGNOSIS — I739 Peripheral vascular disease, unspecified: Secondary | ICD-10-CM | POA: Diagnosis present

## 2023-04-26 LAB — VAS US ABI WITH/WO TBI
Left ABI: 1.04
Right ABI: 0.68

## 2023-04-28 ENCOUNTER — Other Ambulatory Visit (HOSPITAL_COMMUNITY): Payer: Self-pay

## 2023-05-03 ENCOUNTER — Other Ambulatory Visit (HOSPITAL_COMMUNITY): Payer: Self-pay

## 2023-05-04 ENCOUNTER — Other Ambulatory Visit (HOSPITAL_COMMUNITY): Payer: Self-pay

## 2023-05-04 MED ORDER — PREGABALIN 150 MG PO CAPS
150.0000 mg | ORAL_CAPSULE | Freq: Two times a day (BID) | ORAL | 2 refills | Status: DC
  Filled 2023-05-04: qty 60, 30d supply, fill #0

## 2023-05-06 ENCOUNTER — Other Ambulatory Visit (HOSPITAL_COMMUNITY): Payer: Self-pay

## 2023-05-06 MED ORDER — ATORVASTATIN CALCIUM 40 MG PO TABS
40.0000 mg | ORAL_TABLET | Freq: Every day | ORAL | 3 refills | Status: DC
  Filled 2023-05-06: qty 90, 90d supply, fill #0
  Filled 2023-06-03 – 2023-08-02 (×3): qty 90, 90d supply, fill #1
  Filled 2023-11-01: qty 90, 90d supply, fill #2
  Filled 2023-12-02: qty 90, 90d supply, fill #3

## 2023-05-07 ENCOUNTER — Other Ambulatory Visit (HOSPITAL_COMMUNITY): Payer: Self-pay

## 2023-05-07 ENCOUNTER — Other Ambulatory Visit: Payer: Self-pay

## 2023-05-07 MED ORDER — PREGABALIN 300 MG PO CAPS
300.0000 mg | ORAL_CAPSULE | Freq: Two times a day (BID) | ORAL | 0 refills | Status: DC
  Filled 2023-05-07: qty 180, 90d supply, fill #0

## 2023-05-11 ENCOUNTER — Encounter: Payer: Self-pay | Admitting: Cardiovascular Disease

## 2023-05-11 ENCOUNTER — Ambulatory Visit
Payer: No Typology Code available for payment source | Attending: Cardiovascular Disease | Admitting: Cardiovascular Disease

## 2023-05-11 VITALS — BP 118/74 | HR 60 | Ht 63.0 in | Wt 213.4 lb

## 2023-05-11 DIAGNOSIS — E785 Hyperlipidemia, unspecified: Secondary | ICD-10-CM | POA: Diagnosis not present

## 2023-05-11 DIAGNOSIS — I10 Essential (primary) hypertension: Secondary | ICD-10-CM | POA: Diagnosis not present

## 2023-05-11 DIAGNOSIS — I739 Peripheral vascular disease, unspecified: Secondary | ICD-10-CM

## 2023-05-11 DIAGNOSIS — I25708 Atherosclerosis of coronary artery bypass graft(s), unspecified, with other forms of angina pectoris: Secondary | ICD-10-CM

## 2023-05-11 NOTE — Patient Instructions (Signed)
Medication Instructions:  No changes *If you need a refill on your cardiac medications before your next appointment, please call your pharmacy*   Lab Work: Your provider would like for you to have the following labs today: CBC and BMET  If you have labs (blood work) drawn today and your tests are completely normal, you will receive your results only by: MyChart Message (if you have MyChart) OR A paper copy in the mail If you have any lab test that is abnormal or we need to change your treatment, we will call you to review the results.   Testing/Procedures: Your physician has requested that you have a peripheral vascular angiogram. This exam is performed at the hospital. During this exam IV contrast is used to look at arterial blood flow. Please review the information sheet given for details.    Follow-Up: At [facility], you and your health needs are our priority.  As part of our continuing mission to provide you with exceptional heart care, we have created designated Provider Care Teams.  These Care Teams include your primary Cardiologist (physician) and Advanced Practice Providers (APPs -  Physician Assistants and Nurse Practitioners) who all work together to provide you with the care you need, when you need it.  We recommend signing up for the patient portal called "MyChart".  Sign up information is provided on this After Visit Summary.  MyChart is used to connect with patients for Virtual Visits (Telemedicine).  Patients are able to view lab/test results, encounter notes, upcoming appointments, etc.  Non-urgent messages can be sent to your provider as well.   To learn more about what you can do with MyChart, go to [email or web address].    Your next appointment:   Keep your post procedure follow up on 7/30 at 9:15 Other Instructions  [facility] DEPT [facility] [address]  [id number] [facility] [place] [address] Dept: [phone] Loc: [phone]  Gauge Winski.  05/11/2023  You are scheduled for a Peripheral Angiogram on Wednesday, July 3 with Dr. Lorine Bears.  1. Please arrive at the [facility] (Main Entrance A) at [place]: [address] [place], [place] [address] at 6:30 AM (This time is 2 hour(s) before your procedure to ensure your preparation). Free valet parking service is available. You will check in at ADMITTING. The support person will be asked to wait in the waiting room.  It is OK to have someone drop you off and come back when you are ready to be discharged.    Special note: Every effort is made to have your procedure done on time. Please understand that emergencies sometimes delay scheduled procedures.  2. Diet: Do not eat solid foods after midnight.  The patient may have clear liquids until 5am upon the day of the procedure.  3. Labs: You will need to have blood drawn on 05/11/23  4. Medication instructions in preparation for your procedure: Hold all diabetic medication the morning of the procedure (jardiance, ozempic and metformin) Hold the metformin the morning of the procedure and then 48 hours after  On the morning of your procedure, take your Aspirin 81 mg and any morning medicines NOT listed above.  You may use sips of water.  5. Plan to go home the same day, you will only stay overnight if medically necessary. 6. Bring a current list of your medications and current insurance cards. 7. You MUST have a  responsible person to drive you home. 8. Someone MUST be with you the first 24 hours after you arrive home or your discharge will be delayed. 9. Please wear clothes that are easy to get on and off and wear slip-on shoes.  Thank you for allowing [place] to care for you!   -- [place] [facility]

## 2023-05-11 NOTE — Progress Notes (Signed)
  Cardiology Office Note   Date:  05/11/2023   ID:  Steven Aveni Jr., DOB 12/28/1955, MRN [id number]  PCP:  Saadeh, Ghandi, MD  Cardiologist:  Dr. Crenshaw/Addie Alonge  Chief Complaint  Patient presents with   Abnormal doppler      History of Present Illness: Steven Heeg Jr. is a 67 y.o. male who presents for a followup visit regarding peripheral arterial disease and coronary artery disease. He has known history of coronary artery disease with multiple interventions in the past. He had CABG in 2011.  He quit smoking in 2009.  He had pituitary adenoma resection. He has known history of peripheral arterial disease with intervention on both SFAs. Cardiac catheterization in January, 2017 showed mild nonobstructive LAD disease, occluded mid left circumflex and occluded proximal right coronary artery with left-to-right collaterals. SVG to RCA was occluded and LIMA to LAD was atretic. SVG to OM was normal. Abnormal stress test was due to chronically occluded right coronary artery and graft with left-to-right collaterals. His native RCA was not favorable for CTO PCI. I recommended medical therapy. Imdur was added.   He had recurrent right leg claudication in 2022.  Angiography in April of 2022  showed no significant aortoiliac disease.  On the right side, there was severe calcified stenosis in the common femoral artery with patent SFA stent with moderate in-stent restenosis and two-vessel runoff below the knee.  On the left side, there was mild common femoral artery disease, patent SFA stent with minimal restenosis and two-vessel runoff below the knee. The patient underwent right common femoral artery endarterectomy by Dr. Hawken in April 2022.  He had lobectomy done in March of 2023 by Dr. Lightfoot and was diagnosed with carcinoid tumor.    He was seen recently for worsening right calf claudication.  Doppler studies showed a drop in ABI on the right side with evidence of occlusion in the right SFA  stent.  He tried to do more walking and has been extremely limited as his symptoms are starting to happen with half a block.  No chest pain or shortness of breath.  Past Medical History:  Diagnosis Date   Anemia, unspecified    Bell's palsy    resolved - no deficits   Blood transfusion    during treatment for Ca   Blood transfusion without reported diagnosis    CAD (coronary artery disease)    a. s/p multiple PCIs;  b. s/p CABG in 09/2010 (LIMA-LAD, SVG-OM1, SVG-distal RCA/OM2);  Cardiac cath in 12/2015: Mild LAD disease, occluded mid LCX and proximal RCA (left to right collaterals), Occluded SVG to RCA and atretic LIMA. Patent SVG to OM   Chronic back pain    Diabetes mellitus without complication (HCC)    type 2   DJD (degenerative joint disease)    low back & all over    GERD (gastroesophageal reflux disease)    Helicobacter pylori (H. pylori) infection    Hemorrhoids    History of shingles    Hyperlipidemia    Hypertension    Impotence of organic origin    Myocardial infarction (HCC)    2005 and 2012    PAD (peripheral artery disease) (HCC)    a. s/p bilat SFA stents;  b. ABIs 11/2012: R 0.99, L 0.86.   Pancreatic cancer (HCC)    surgery 2009 cyst removed   Pituitary adenoma (HCC)    surgery at [facility] Feb 2020   Sleep apnea 06/05/2019   does not use cpap     Substance abuse (HCC)    drug and alcohol abuse    Past Surgical History:  Procedure Laterality Date   ABDOMINAL AORTAGRAM N/A 09/13/2013   Procedure: ABDOMINAL AORTAGRAM;  Surgeon: Tyjuan Demetro A Granite Godman, MD;  Location: [facility];  Service: Cardiovascular;  Laterality: N/A;   ABDOMINAL AORTAGRAM N/A 08/29/2014   Procedure: ABDOMINAL AORTAGRAM;  Surgeon: Ebb Carelock A Jenesis Martin, MD;  Location: [facility];  Service: Cardiovascular;  Laterality: N/A;   ABDOMINAL AORTOGRAM W/LOWER EXTREMITY N/A 02/19/2021   Procedure: ABDOMINAL AORTOGRAM W/LOWER EXTREMITY;  Surgeon: Sheena Simonis A, MD;  Location: [facility];  Service:  Cardiovascular;  Laterality: N/A;   BACK SURGERY     1992   BRONCHIAL BIOPSY  01/13/2022   Procedure: BRONCHIAL BIOPSIES;  Surgeon: Icard, Bradley L, DO;  Location: [facility];  Service: Pulmonary;;   BRONCHIAL BRUSHINGS  01/13/2022   Procedure: BRONCHIAL BRUSHINGS;  Surgeon: Icard, Bradley L, DO;  [facility]: [facility];  Service: Pulmonary;;   BRONCHIAL NEEDLE ASPIRATION BIOPSY  01/13/2022   Procedure: BRONCHIAL NEEDLE ASPIRATION BIOPSIES;  Surgeon: Icard, Bradley L, DO;  Location: [facility];  Service: Pulmonary;;   CARDIAC CATHETERIZATION N/A 12/25/2015   Procedure: Left Heart Cath and Cors/Grafts Angiography;  Surgeon: Jim Philemon A Rudi Bunyard, MD;  Location: [facility];  Service: Cardiovascular;  Laterality: N/A;   COLONOSCOPY     CORONARY ARTERY BYPASS GRAFT  nov 2011   x 4   ENDARTERECTOMY FEMORAL Right 03/03/2021   Procedure: RIGHT FEMORAL ENDARTERECTOMY;  Surgeon: Hawken, Nixxon N, MD;  Location: [facility];  Service: Vascular;  Laterality: Right;   ESOPHAGOGASTRODUODENOSCOPY (EGD) WITH PROPOFOL N/A 04/02/2017   Procedure: ESOPHAGOGASTRODUODENOSCOPY (EGD) WITH PROPOFOL;  Surgeon: Hung, Patrick, MD;  Location: [facility];  Service: Endoscopy;  Laterality: N/A;   HEMORRHOID SURGERY  10/30/2011   Procedure: HEMORRHOIDECTOMY;  Surgeon: Douglas A Blackman, MD;  Location: [facility];  Service: General;  Laterality: N/A;   JOINT REPLACEMENT     pancreatic  cancer  05/10/2008   Resection of distal panrease and spleen   PARTIAL KNEE ARTHROPLASTY Right 11/19/2014   Procedure: RIGHT UNI-KNEE ARTHROPLASTY MEDIALLY;  Surgeon: Matthew D Olin, MD;  Location: [facility];  Service: Orthopedics;  Laterality: Right;   PATCH ANGIOPLASTY Right 03/03/2021   Procedure: PATCH ANGIOPLASTY;  Surgeon: Hawken, Kery N, MD;  Location: [facility];  Service: Vascular;  Laterality: Right;   PERIPHERAL VASCULAR CATHETERIZATION  Oct 2014   Lt SFA PTA   PERIPHERAL VASCULAR CATHETERIZATION  Oct 2015   ISR Lt SFA-PTA   right partial  knee replacement     splenectomy     THROMBECTOMY FEMORAL ARTERY Left 09/13/2013   Procedure: THROMBECTOMY FEMORAL ARTERY;  Surgeon: Britani Beattie A Jora Galluzzo, MD;  Location: [facility];  Service: Cardiovascular;  Laterality: Left;   TRANSPHENOIDAL / TRANSNASAL HYPOPHYSECTOMY / RESECTION PITUITARY TUMOR  12/2018   [facility]   VIDEO BRONCHOSCOPY N/A 02/09/2022   Procedure: VIDEO BRONCHOSCOPY;  Surgeon: Lightfoot, Harrell O, MD;  Location: [facility];  Service: Thoracic;  Laterality: N/A;   VIDEO BRONCHOSCOPY WITH RADIAL ENDOBRONCHIAL ULTRASOUND  01/13/2022   Procedure: RADIAL ENDOBRONCHIAL ULTRASOUND;  Surgeon: Icard, Bradley L, DO;  Location: [facility];  Service: Pulmonary;;     Current Outpatient Medications  Medication Sig Dispense Refill   aspirin EC 81 MG tablet Take 1 tablet (81 mg total) by mouth daily.     atorvastatin (LIPITOR) 40 MG tablet Take 1 tablet (40 mg total) by mouth at bedtime.       colchicine 0.6 MG tablet Take 1 tablet by mouth as needed.     DULoxetine HCl 30 MG CSDR Take 30 mg by mouth 2 (two) times daily.     empagliflozin (JARDIANCE) 25 MG TABS tablet Take by mouth daily. Takes a half tablet in the morning     ezetimibe (ZETIA) 10 MG tablet TAKE 1 TABLET BY MOUTH DAILY 100 tablet 2   fish oil-omega-3 fatty acids 1000 MG capsule Take 1 capsule (1 g total) by mouth daily. 60 capsule 1   ibuprofen (ADVIL) 400 MG tablet Take 400 mg by mouth 2 (two) times daily as needed.     levothyroxine (SYNTHROID) 25 MCG tablet Take 25 mcg by mouth daily.     lisinopril (ZESTRIL) 2.5 MG tablet Take 2.5 mg by mouth daily.     metFORMIN (GLUCOPHAGE) 500 MG tablet Take 1 tablet (500 mg total) by mouth 2 (two) times daily with a meal.     metoprolol tartrate (LOPRESSOR) 25 MG tablet TAKE 1 TABLET BY MOUTH TWICE  DAILY . 90 tablet 3   Multiple Vitamin (MULTIVITAMIN) tablet Take 1 tablet by mouth daily. 30 tablet 10   mupirocin ointment (BACTROBAN) 2 % Apply and massage in one gram (pea sized amount)  inside each nostril twice a day for 5 days prior to, and the day of, surgical procedure 22 g 0   nitroGLYCERIN (NITROSTAT) 0.4 MG SL tablet Place 0.4 mg under the tongue every 2 (two) hours as needed for chest pain.     oxyCODONE (OXY IR/ROXICODONE) 5 MG immediate release tablet Take 1 tablet (5 mg total) by mouth every 4 (four) hours as needed for severe pain. 30 tablet 0   pantoprazole (PROTONIX) 40 MG tablet TAKE 1 TABLET BY MOUTH DAILY 90 tablet 3   pregabalin (LYRICA) 150 MG capsule Take 2 capsules (300 mg total) by mouth 2 (two) times daily.     Semaglutide,0.25 or 0.5MG/DOS, (OZEMPIC, 0.25 OR 0.5 MG/DOSE,) 2 MG/3ML SOPN Inject 0.25 mg into the skin once a week 12 mL 0   sildenafil (VIAGRA) 100 MG tablet Take 0.5-1 tablets (50-100 mg total) by mouth daily as needed for erectile dysfunction. 5 tablet 11   sulfamethoxazole-trimethoprim (BACTRIM DS) 800-160 MG tablet Take 1 tablet by mouth 2 (two) times daily for 10 days 20 tablet 0   tadalafil (CIALIS) 5 MG tablet Take 1 tablet (5 mg total) by mouth daily. 30 tablet 11   tamsulosin (FLOMAX) 0.4 MG CAPS capsule Take 1 capsule by mouth daily.     atorvastatin (LIPITOR) 40 MG tablet Take 1 tablet (40 mg total) by mouth daily for cholesterol 90 tablet 3   cephALEXin (KEFLEX) 500 MG capsule Take 1 capsule (500 mg total) by mouth every 6 (six) hours for a total of 4 doses beginning this afternoon. (Patient not taking: Reported on 05/11/2023) 4 capsule 0   chlorhexidine (HIBICLENS) 4 % external liquid Apply topically over lower back and buttocks towards the end of showering for 7 days leading up to, and including the day of planned surgery (Patient not taking: Reported on 05/11/2023) 237 mL 0   moxifloxacin (VIGAMOX) 0.5 % ophthalmic solution Place 1 drop into affected eye(s) 3 (three) times daily. (Patient not taking: Reported on 05/11/2023) 3 mL 0   pregabalin (LYRICA) 150 MG capsule Take 1 capsule (150 mg total) by mouth 2 (two) times daily. 60 capsule 2    pregabalin (LYRICA) 300 MG capsule Take 1 capsule (300 mg total) by mouth 2 (  two) times daily for neuropathic pain 180 capsule 0   Propylene Glycol (SYSTANE BALANCE) 0.6 % SOLN Place 1 drop into both eyes 3 (three) times daily as needed (dry eyes). (Patient not taking: Reported on 05/11/2023)     Semaglutide,0.25 or 0.5MG/DOS, (OZEMPIC, 0.25 OR 0.5 MG/DOSE,) 2 MG/3ML SOPN Inject 0.25 mg into the skin once a week. 6 mL 0   sildenafil (VIAGRA) 100 MG tablet Take 1/2-1 tablet by mouth daily as needed 30-60 minutes prior to planned activity. 30 tablet 5   Sodium Fluoride 1.1 % PSTE Use as directed (Patient not taking: Reported on 05/11/2023) 100 mL 1   tadalafil (CIALIS) 5 MG tablet Take 1 tablet (5 mg total) by mouth in the morning. 90 tablet 3   tamsulosin (FLOMAX) 0.4 MG CAPS capsule Take 1 capsule (0.4 mg total) by mouth daily 30 minutes after evening meal. 90 capsule 3   No current facility-administered medications for this visit.    Allergies:   Patient has no known allergies.    Social History:  The patient  reports that he quit smoking about 15 years ago. His smoking use included cigarettes. He has a 18.00 pack-year smoking history. He has never used smokeless tobacco. He reports that he does not currently use alcohol. He reports that he does not currently use drugs.   Family History:  The patient's family history includes Cancer in his father and sister; Heart attack in his father.    ROS:  Please see the history of present illness.   Otherwise, review of systems are positive for none.   All other systems are reviewed and negative.    PHYSICAL EXAM: VS:  BP 118/74 (BP Location: Right Arm, Patient Position: Sitting)   Pulse 60   Ht 5' 3" (1.6 m)   Wt 213 lb 6.4 oz (96.8 kg)   BMI 37.80 kg/m  , BMI Body mass index is 37.8 kg/m. GEN: Well nourished, well developed, in no acute distress  HEENT: normal  Neck: no JVD, carotid bruits, or masses Cardiac: RRR; no murmurs, rubs, or  gallops,no edema  Respiratory:  clear to auscultation bilaterally, normal work of breathing GI: soft, nontender, nondistended, + BS MS: no deformity or atrophy  Skin: warm and dry, no rash Neuro:  Strength and sensation are intact Psych: euthymic mood, full affect Femoral: +2 bilaterally.  Distal pulses are palpable on the left but not on the right.  EKG:  EKG is not ordered today.   Recent Labs: 07/05/2022: ALT 26; BUN 16; Creatinine, Ser 0.95; Hemoglobin 13.1; Platelets 304; Potassium 4.1; Sodium 136    Lipid Panel    Component Value Date/Time   CHOL 104 03/04/2021 0535   CHOL 107 08/09/2020 1549   TRIG 87 03/04/2021 0535   HDL 36 (L) 03/04/2021 0535   HDL 33 (L) 08/09/2020 1549   CHOLHDL 2.9 03/04/2021 0535   VLDL 17 03/04/2021 0535   LDLCALC 51 03/04/2021 0535   LDLCALC 49 08/09/2020 1549   LDLDIRECT 80 11/06/2013 0925      Wt Readings from Last 3 Encounters:  05/11/23 213 lb 6.4 oz (96.8 kg)  04/06/23 213 lb 9.6 oz (96.9 kg)  11/20/22 215 lb (97.5 kg)        ASSESSMENT AND PLAN:   1. Peripheral arterial disease: Previous bilateral SFA intervention in 2014 in 2015 and most recently right common femoral artery endarterectomy in 2022 .  Severe lifestyle limiting claudication of the right lower extremity Rutherford class III.  This   is due to an occluded right SFA stent.  His symptoms did not improve with a walking program and he feels very limited by this.  Due to that, I recommend proceeding with abdominal aortogram with lower extremity angiography and possible endovascular intervention.  I discussed the procedure in details as well as risks and benefits.  Planned access is via the left common femoral artery and most likely will use drug-coated balloon for in-stent restenosis.  2. Peripheral diabetic neuropathy: Currently on Lyrica with improvement in symptoms.  3. Coronary artery disease involving native coronary arteries with stable angina: Chronic atypical chest  pain.  His symptoms are overall stable.    4. Essential hypertension: Blood pressure is controlled on current medications.  5. Hyperlipidemia: Most recent lipid profile from 2022 showed an LDL of 51.  His LDL is at target on atorvastatin and ezetimibe.     Disposition:   Proceed with angiography and follow-up after.  Signed,  Geraldine Tesar, MD  05/11/2023 1:16 PM    [facility] 

## 2023-05-11 NOTE — H&P (View-Only) (Signed)
Cardiology Office Note   Date:  05/11/2023   ID:  Steven Hale., DOB Feb 13, 1956, MRN [id number]  PCP:  Brynda Peon, MD  Cardiologist:  Dr. Lysle Rubens  Chief Complaint  Patient presents with   Abnormal doppler      History of Present Illness: Steven Hale. is a 67 y.o. male who presents for a followup visit regarding peripheral arterial disease and coronary artery disease. He has known history of coronary artery disease with multiple interventions in the past. He had CABG in 2011.  He quit smoking in 2009.  He had pituitary adenoma resection. He has known history of peripheral arterial disease with intervention on both SFAs. Cardiac catheterization in January, 2017 showed mild nonobstructive LAD disease, occluded mid left circumflex and occluded proximal right coronary artery with left-to-right collaterals. SVG to RCA was occluded and LIMA to LAD was atretic. SVG to OM was normal. Abnormal stress test was due to chronically occluded right coronary artery and graft with left-to-right collaterals. His native RCA was not favorable for CTO PCI. I recommended medical therapy. Imdur was added.   He had recurrent right leg claudication in 2022.  Angiography in April of 2022  showed no significant aortoiliac disease.  On the right side, there was severe calcified stenosis in the common femoral artery with patent SFA stent with moderate in-stent restenosis and two-vessel runoff below the knee.  On the left side, there was mild common femoral artery disease, patent SFA stent with minimal restenosis and two-vessel runoff below the knee. The patient underwent right common femoral artery endarterectomy by Dr. Lenell Antu in April 2022.  He had lobectomy done in March of 2023 by Dr. Cliffton Asters and was diagnosed with carcinoid tumor.    He was seen recently for worsening right calf claudication.  Doppler studies showed a drop in ABI on the right side with evidence of occlusion in the right SFA  stent.  He tried to do more walking and has been extremely limited as his symptoms are starting to happen with half a block.  No chest pain or shortness of breath.  Past Medical History:  Diagnosis Date   Anemia, unspecified    Bell's palsy    resolved - no deficits   Blood transfusion    during treatment for Ca   Blood transfusion without reported diagnosis    CAD (coronary artery disease)    a. s/p multiple PCIs;  b. s/p CABG in 09/2010 (LIMA-LAD, SVG-OM1, SVG-distal RCA/OM2);  Cardiac cath in 12/2015: Mild LAD disease, occluded mid LCX and proximal RCA (left to right collaterals), Occluded SVG to RCA and atretic LIMA. Patent SVG to OM   Chronic back pain    Diabetes mellitus without complication (HCC)    type 2   DJD (degenerative joint disease)    low back & all over    GERD (gastroesophageal reflux disease)    Helicobacter pylori (H. pylori) infection    Hemorrhoids    History of shingles    Hyperlipidemia    Hypertension    Impotence of organic origin    Myocardial infarction (HCC)    2005 and 2012    PAD (peripheral artery disease) (HCC)    a. s/p bilat SFA stents;  b. ABIs 11/2012: R 0.99, L 0.86.   Pancreatic cancer [facility])    surgery 2009 cyst removed   Pituitary adenoma [facility])    surgery at [facility] Feb 2020   Sleep apnea 06/05/2019   does not use cpap  Substance abuse (HCC)    drug and alcohol abuse    Past Surgical History:  Procedure Laterality Date   ABDOMINAL AORTAGRAM N/A 09/13/2013   Procedure: ABDOMINAL AORTAGRAM;  Surgeon: Iran Ouch, MD;  [facility]: [facility] CATH LAB;  Service: Cardiovascular;  Laterality: N/A;   ABDOMINAL AORTAGRAM N/A 08/29/2014   Procedure: ABDOMINAL Ronny Flurry;  Surgeon: Iran Ouch, MD;  Location: [facility] CATH LAB;  Service: Cardiovascular;  Laterality: N/A;   ABDOMINAL AORTOGRAM W/LOWER EXTREMITY N/A 02/19/2021   Procedure: ABDOMINAL AORTOGRAM W/LOWER EXTREMITY;  Surgeon: Iran Ouch, MD;  Location: [facility] INVASIVE CV LAB;  Service:  Cardiovascular;  Laterality: N/A;   BACK SURGERY     1992   BRONCHIAL BIOPSY  01/13/2022   Procedure: BRONCHIAL BIOPSIES;  Surgeon: Josephine Igo, DO;  Location: [facility] ENDOSCOPY;  Service: Pulmonary;;   BRONCHIAL BRUSHINGS  01/13/2022   Procedure: BRONCHIAL BRUSHINGS;  Surgeon: Josephine Igo, DO;  [facility]: [facility] ENDOSCOPY;  Service: Pulmonary;;   BRONCHIAL NEEDLE ASPIRATION BIOPSY  01/13/2022   Procedure: BRONCHIAL NEEDLE ASPIRATION BIOPSIES;  Surgeon: Josephine Igo, DO;  [facility]: [facility] ENDOSCOPY;  Service: Pulmonary;;   CARDIAC CATHETERIZATION N/A 12/25/2015   Procedure: Left Heart Cath and Cors/Grafts Angiography;  Surgeon: Iran Ouch, MD;  [facility]: [facility] INVAS[facility] CV LAB;  Service: Cardiovascular;  Laterality: N/A;   COLONOSCOPY     CORONARY ARTERY BYPASS GRAFT  nov 2011   x 4   ENDARTERECTOMY FEMORAL Right 03/03/2021   Procedure: RIGHT FEMORAL ENDARTERECTOMY;  Surgeon: Leonie Douglas, MD;  Location[facility] OR;  Service: Vascular;  Laterality: Right;   ESOPHAGOGASTRODUODENOSCOPY (EGD) WITH PROPOFOL N/A 04/02/2017   Procedure: ESOPHAGOGASTRODUODENOSCOPY (EGD) WITH PROPOFOL;  Surgeon: Jeani Hawking, MD;  Location: [facility] ENDOSCOPY;  Service: Endoscopy;  Laterality: N/A;   HEMORRHOID SURGERY  10/30/2011   Procedure: HEMORRHOIDECTOMY;  Surgeon: Shelly Rubenstein, MD;  Location: [facility] OR;  Service: General;  Laterality: N/A;   JOINT REPLACEMENT     pancreatic  cancer  05/10/2008   Resection of distal panrease and spleen   PARTIAL KNEE ARTHROPLASTY Right 11/19/2014   Procedure: RIGHT UNI-KNEE ARTHROPLASTY MEDIALLY;  Surgeon: Shelda Pal, MD;  Location: [facility] ORS;  Service: Orthopedics;  Laterality: Right;   PATCH ANGIOPLASTY Right 03/03/2021   Procedure: PATCH ANGIOPLASTY;  Surgeon: Leonie Douglas, MD;  Location: [facility] OR;  Service: Vascular;  Laterality: Right;   PERIPHERAL VASCULAR CATHETERIZATION  Oct 2014   Lt SFA PTA   PERIPHERAL VASCULAR CATHETERIZATION  Oct 2015   ISR Lt SFA-PTA   right partial  knee replacement     splenectomy     THROMBECTOMY FEMORAL ARTERY Left 09/13/2013   Procedure: THROMBECTOMY FEMORAL ARTERY;  Surgeon: Iran Ouch, MD;  [facility] CATH [facility];  Service: Cardiovascular;  Laterality: Left;   TRANSPHENOIDAL / TRANSNASAL HYPOPHYSECTOMY / RESECTION PITUITARY TUMOR  12/2018   [facility]   VIDEO BRONCHOSCOPY N/A 02/09/2022   Procedure: VIDEO BRONCHOSCOPY;  Surgeon: Corliss Skains, MD;  Location: [facility] OR;  Service: Thoracic;  Laterality: N/A;   VIDEO BRONCHOSCOPY WITH RADIAL ENDOBRONCHIAL ULTRASOUND  01/13/2022   Procedure: RADIAL ENDOBRONCHIAL ULTRASOUND;  Surgeon: Josephine Igo, DO;  [facility]: [facility] ENDOS[facility];  Service: Pulmonary;;     Current Outpatient Medications  Medication Sig Dispense Refill   aspirin EC 81 MG tablet Take 1 tablet (81 mg total) by mouth daily.     atorvastatin (LIPITOR) 40 MG tablet Take 1 tablet (40 mg total) by mouth at bedtime.  colchicine 0.6 MG tablet Take 1 tablet by mouth as needed.     DULoxetine HCl 30 MG CSDR Take 30 mg by mouth 2 (two) times daily.     empagliflozin (JARDIANCE) 25 MG TABS tablet Take by mouth daily. Takes a half tablet in the morning     ezetimibe (ZETIA) 10 MG tablet TAKE 1 TABLET BY MOUTH DAILY 100 tablet 2   fish oil-omega-3 fatty acids 1000 MG capsule Take 1 capsule (1 g total) by mouth daily. 60 capsule 1   ibuprofen (ADVIL) 400 MG tablet Take 400 mg by mouth 2 (two) times daily as needed.     levothyroxine (SYNTHROID) 25 MCG tablet Take 25 mcg by mouth daily.     lisinopril (ZESTRIL) 2.5 MG tablet Take 2.5 mg by mouth daily.     metFORMIN (GLUCOPHAGE) 500 MG tablet Take 1 tablet (500 mg total) by mouth 2 (two) times daily with a meal.     metoprolol tartrate (LOPRESSOR) 25 MG tablet TAKE 1 TABLET BY MOUTH TWICE  DAILY . 90 tablet 3   Multiple Vitamin (MULTIVITAMIN) tablet Take 1 tablet by mouth daily. 30 tablet 10   mupirocin ointment (BACTROBAN) 2 % Apply and massage in one gram (pea sized amount)  inside each nostril twice a day for 5 days prior to, and the day of, surgical procedure 22 g 0   nitroGLYCERIN (NITROSTAT) 0.4 MG SL tablet Place 0.4 mg under the tongue every 2 (two) hours as needed for chest pain.     oxyCODONE (OXY IR/ROXICODONE) 5 MG immediate release tablet Take 1 tablet (5 mg total) by mouth every 4 (four) hours as needed for severe pain. 30 tablet 0   pantoprazole (PROTONIX) 40 MG tablet TAKE 1 TABLET BY MOUTH DAILY 90 tablet 3   pregabalin (LYRICA) 150 MG capsule Take 2 capsules (300 mg total) by mouth 2 (two) times daily.     Semaglutide,0.25 or 0.5MG /DOS, (OZEMPIC, 0.25 OR 0.5 MG/DOSE,) 2 MG/3ML SOPN Inject 0.25 mg into the skin once a week 12 mL 0   sildenafil (VIAGRA) 100 MG tablet Take 0.5-1 tablets (50-100 mg total) by mouth daily as needed for erectile dysfunction. 5 tablet 11   sulfamethoxazole-trimethoprim (BACTRIM DS) 800-160 MG tablet Take 1 tablet by mouth 2 (two) times daily for 10 days 20 tablet 0   tadalafil (CIALIS) 5 MG tablet Take 1 tablet (5 mg total) by mouth daily. 30 tablet 11   tamsulosin (FLOMAX) 0.4 MG CAPS capsule Take 1 capsule by mouth daily.     atorvastatin (LIPITOR) 40 MG tablet Take 1 tablet (40 mg total) by mouth daily for cholesterol 90 tablet 3   cephALEXin (KEFLEX) 500 MG capsule Take 1 capsule (500 mg total) by mouth every 6 (six) hours for a total of 4 doses beginning this afternoon. (Patient not taking: Reported on 05/11/2023) 4 capsule 0   chlorhexidine (HIBICLENS) 4 % external liquid Apply topically over lower back and buttocks towards the end of showering for 7 days leading up to, and including the day of planned surgery (Patient not taking: Reported on 05/11/2023) 237 mL 0   moxifloxacin (VIGAMOX) 0.5 % ophthalmic solution Place 1 drop into affected eye(s) 3 (three) times daily. (Patient not taking: Reported on 05/11/2023) 3 mL 0   pregabalin (LYRICA) 150 MG capsule Take 1 capsule (150 mg total) by mouth 2 (two) times daily. 60 capsule 2    pregabalin (LYRICA) 300 MG capsule Take 1 capsule (300 mg total) by mouth 2 (  two) times daily for neuropathic pain 180 capsule 0   Propylene Glycol (SYSTANE BALANCE) 0.6 % SOLN Place 1 drop into both eyes 3 (three) times daily as needed (dry eyes). (Patient not taking: Reported on 05/11/2023)     Semaglutide,0.25 or 0.5MG /DOS, (OZEMPIC, 0.25 OR 0.5 MG/DOSE,) 2 MG/3ML SOPN Inject 0.25 mg into the skin once a week. 6 mL 0   sildenafil (VIAGRA) 100 MG tablet Take 1/2-1 tablet by mouth daily as needed 30-60 minutes prior to planned activity. 30 tablet 5   Sodium Fluoride 1.1 % PSTE Use as directed (Patient not taking: Reported on 05/11/2023) 100 mL 1   tadalafil (CIALIS) 5 MG tablet Take 1 tablet (5 mg total) by mouth in the morning. 90 tablet 3   tamsulosin (FLOMAX) 0.4 MG CAPS capsule Take 1 capsule (0.4 mg total) by mouth daily 30 minutes after evening meal. 90 capsule 3   No current facility-administered medications for this visit.    Allergies:   Patient has no known allergies.    Social History:  The patient  reports that he quit smoking about 15 years ago. His smoking use included cigarettes. He has a 18.00 pack-year smoking history. He has never used smokeless tobacco. He reports that he does not currently use alcohol. He reports that he does not currently use drugs.   Family History:  The patient's family history includes Cancer in his father and sister; Heart attack in his father.    ROS:  Please see the history of present illness.   Otherwise, review of systems are positive for none.   All other systems are reviewed and negative.    PHYSICAL EXAM: VS:  BP 118/74 (BP Location: Right Arm, Patient Position: Sitting)   Pulse 60   Ht 5\' 3"  (1.6 m)   Wt 213 lb 6.4 oz (96.8 kg)   BMI 37.80 kg/m  , BMI Body mass index is 37.8 kg/m. GEN: Well nourished, well developed, in no acute distress  HEENT: normal  Neck: no JVD, carotid bruits, or masses Cardiac: RRR; no murmurs, rubs, or  gallops,no edema  Respiratory:  clear to auscultation bilaterally, normal work of breathing GI: soft, nontender, nondistended, + BS MS: no deformity or atrophy  Skin: warm and dry, no rash Neuro:  Strength and sensation are intact Psych: euthymic mood, full affect Femoral: +2 bilaterally.  Distal pulses are palpable on the left but not on the right.  EKG:  EKG is not ordered today.   Recent Labs: 07/05/2022: ALT 26; BUN 16; Creatinine, Ser 0.95; Hemoglobin 13.1; Platelets 304; Potassium 4.1; Sodium 136    Lipid Panel    Component Value Date/Time   CHOL 104 03/04/2021 0535   CHOL 107 08/09/2020 1549   TRIG 87 03/04/2021 0535   HDL 36 (L) 03/04/2021 0535   HDL 33 (L) 08/09/2020 1549   CHOLHDL 2.9 03/04/2021 0535   VLDL 17 03/04/2021 0535   LDLCALC 51 03/04/2021 0535   LDLCALC 49 08/09/2020 1549   LDLDIRECT 80 11/06/2013 0925      Wt Readings from Last 3 Encounters:  05/11/23 213 lb 6.4 oz (96.8 kg)  04/06/23 213 lb 9.6 oz (96.9 kg)  11/20/22 215 lb (97.5 kg)        ASSESSMENT AND PLAN:   1. Peripheral arterial disease: Previous bilateral SFA intervention in 2014 in 2015 and most recently right common femoral artery endarterectomy in 2022 .  Severe lifestyle limiting claudication of the right lower extremity Rutherford class III.  This  is due to an occluded right SFA stent.  His symptoms did not improve with a walking program and he feels very limited by this.  Due to that, I recommend proceeding with abdominal aortogram with lower extremity angiography and possible endovascular intervention.  I discussed the procedure in details as well as risks and benefits.  Planned access is via the left common femoral artery and most likely will use drug-coated balloon for in-stent restenosis.  2. Peripheral diabetic neuropathy: Currently on Lyrica with improvement in symptoms.  3. Coronary artery disease involving native coronary arteries with stable angina: Chronic atypical chest  pain.  His symptoms are overall stable.    4. Essential hypertension: Blood pressure is controlled on current medications.  5. Hyperlipidemia: Most recent lipid profile from 2022 showed an LDL of 51.  His LDL is at target on atorvastatin and ezetimibe.     Disposition:   Proceed with angiography and follow-up after.  Signed,  Lorine Bears, MD  05/11/2023 1:16 PM    [facility]

## 2023-05-12 LAB — BASIC METABOLIC PANEL
BUN/Creatinine Ratio: 14 (ref 10–24)
BUN: 14 mg/dL (ref 8–27)
CO2: 24 mmol/L (ref 20–29)
Calcium: 10.4 mg/dL — ABNORMAL HIGH (ref 8.6–10.2)
Chloride: 99 mmol/L (ref 96–106)
Creatinine, Ser: 0.97 mg/dL (ref 0.76–1.27)
Glucose: 120 mg/dL — ABNORMAL HIGH (ref 70–99)
Potassium: 5.2 mmol/L (ref 3.5–5.2)
Sodium: 139 mmol/L (ref 134–144)
eGFR: 86 mL/min/{1.73_m2} (ref >59)

## 2023-05-12 LAB — CBC
Hematocrit: 38.9 % (ref 37.5–51.0)
Hemoglobin: 12.6 g/dL — ABNORMAL LOW (ref 13.0–17.7)
MCH: 28.6 pg (ref 26.6–33.0)
MCHC: 32.4 g/dL (ref 31.5–35.7)
MCV: 88 fL (ref 79–97)
Platelets: 367 10*3/uL (ref 150–450)
RBC: 4.41 x10E6/uL (ref 4.14–5.80)
RDW: 14.4 % (ref 11.6–15.4)
WBC: 4.4 10*3/uL (ref 3.4–10.8)

## 2023-05-17 ENCOUNTER — Telehealth: Payer: Self-pay | Admitting: *Deleted

## 2023-05-17 NOTE — Telephone Encounter (Signed)
Cardiac Catheterization scheduled at [facility] for: Wednesday May 19, 2023 8:30 AM Arrival time [facility] Main Entrance A at: 6:30 AM  Nothing to eat after midnight prior to procedure, clear liquids until 5 AM day of procedure.  Medication instructions: -Hold:  Metformin-day of procedure and 48 hours post procedure  Jardiance-AM of procedure Ozempic-weekly-ask day of week patient is taking -Other usual morning medications can be taken with sips of water including aspirin 81 mg.  Confirmed patient has responsible adult to drive home post procedure and be with patient first 24 hours after arriving home.  Plan to go home the same day, you will only stay overnight if medically necessary.  Left message for patient to call back to review procedure instructions

## 2023-05-19 ENCOUNTER — Other Ambulatory Visit (HOSPITAL_COMMUNITY): Payer: Self-pay

## 2023-05-19 ENCOUNTER — Ambulatory Visit (HOSPITAL_COMMUNITY)
Admission: RE | Admit: 2023-05-19 | Discharge: 2023-05-19 | Disposition: A | Discharge status: Home / Self Care | Payer: 59 | Source: Ambulatory Visit | Attending: Cardiovascular Disease | Admitting: Cardiovascular Disease

## 2023-05-19 ENCOUNTER — Encounter (HOSPITAL_COMMUNITY)
Admission: RE | Disposition: A | Discharge status: Home / Self Care | Payer: Self-pay | Source: Ambulatory Visit | Attending: Cardiovascular Disease

## 2023-05-19 ENCOUNTER — Other Ambulatory Visit: Payer: Self-pay | Admitting: Cardiovascular Disease

## 2023-05-19 ENCOUNTER — Other Ambulatory Visit: Payer: Self-pay | Admitting: *Deleted

## 2023-05-19 DIAGNOSIS — E1142 Type 2 diabetes mellitus with diabetic polyneuropathy: Secondary | ICD-10-CM | POA: Diagnosis not present

## 2023-05-19 DIAGNOSIS — Z87891 Personal history of nicotine dependence: Secondary | ICD-10-CM | POA: Insufficient documentation

## 2023-05-19 DIAGNOSIS — I10 Essential (primary) hypertension: Secondary | ICD-10-CM | POA: Diagnosis not present

## 2023-05-19 DIAGNOSIS — E785 Hyperlipidemia, unspecified: Secondary | ICD-10-CM | POA: Insufficient documentation

## 2023-05-19 DIAGNOSIS — Z7984 Long term (current) use of oral hypoglycemic drugs: Secondary | ICD-10-CM | POA: Insufficient documentation

## 2023-05-19 DIAGNOSIS — I70211 Atherosclerosis of native arteries of extremities with intermittent claudication, right leg: Secondary | ICD-10-CM | POA: Insufficient documentation

## 2023-05-19 DIAGNOSIS — E1151 Type 2 diabetes mellitus with diabetic peripheral angiopathy without gangrene: Secondary | ICD-10-CM | POA: Insufficient documentation

## 2023-05-19 DIAGNOSIS — I25118 Atherosclerotic heart disease of native coronary artery with other forms of angina pectoris: Secondary | ICD-10-CM | POA: Insufficient documentation

## 2023-05-19 DIAGNOSIS — Z951 Presence of aortocoronary bypass graft: Secondary | ICD-10-CM | POA: Diagnosis not present

## 2023-05-19 DIAGNOSIS — Z79899 Other long term (current) drug therapy: Secondary | ICD-10-CM | POA: Insufficient documentation

## 2023-05-19 DIAGNOSIS — I739 Peripheral vascular disease, unspecified: Secondary | ICD-10-CM

## 2023-05-19 DIAGNOSIS — Z7985 Long-term (current) use of injectable non-insulin antidiabetic drugs: Secondary | ICD-10-CM | POA: Insufficient documentation

## 2023-05-19 HISTORY — PX: ABDOMINAL AORTOGRAM W/LOWER EXTREMITY: CATH118223

## 2023-05-19 HISTORY — PX: PERIPHERAL VASCULAR BALLOON ANGIOPLASTY: CATH118281

## 2023-05-19 LAB — GLUCOSE, CAPILLARY
Glucose-Capillary: 115 mg/dL — ABNORMAL HIGH (ref 70–99)
Glucose-Capillary: 111 mg/dL — ABNORMAL HIGH (ref 70–99)

## 2023-05-19 LAB — POCT ACTIVATED CLOTTING TIME
Activated Clotting Time: 183 seconds
Activated Clotting Time: 232 seconds

## 2023-05-19 SURGERY — ABDOMINAL AORTOGRAM W/LOWER EXTREMITY
Anesthesia: LOCAL

## 2023-05-19 MED ORDER — CLOPIDOGREL BISULFATE 300 MG PO TABS
ORAL_TABLET | ORAL | Status: AC
  Filled 2023-05-19: qty 1

## 2023-05-19 MED ORDER — SODIUM CHLORIDE 0.9 % IV SOLN: 250.0000 mL | INTRAVENOUS | Status: DC | PRN

## 2023-05-19 MED ORDER — FENTANYL CITRATE (PF) 100 MCG/2ML IJ SOLN
INTRAMUSCULAR | Status: DC | PRN
  Administered 2023-05-19: 50 ug via INTRAVENOUS

## 2023-05-19 MED ORDER — HEPARIN SODIUM (PORCINE) 1000 UNIT/ML IJ SOLN
INTRAMUSCULAR | Status: AC
  Filled 2023-05-19: qty 10

## 2023-05-19 MED ORDER — CLOPIDOGREL BISULFATE 75 MG PO TABS
75.0000 mg | ORAL_TABLET | Freq: Every day | ORAL | 6 refills | Status: DC
  Filled 2023-05-19: qty 30, 30d supply, fill #0
  Filled 2023-06-16: qty 30, 30d supply, fill #1
  Filled 2023-07-11: qty 30, 30d supply, fill #2
  Filled 2023-08-02: qty 30, 30d supply, fill #3
  Filled 2023-12-02: qty 30, 30d supply, fill #4

## 2023-05-19 MED ORDER — SODIUM CHLORIDE 0.9% FLUSH: 3.0000 mL | INTRAVENOUS | Status: DC | PRN

## 2023-05-19 MED ORDER — SODIUM CHLORIDE 0.9% FLUSH: 3.0000 mL | Freq: Two times a day (BID) | INTRAVENOUS | Status: DC

## 2023-05-19 MED ORDER — MIDAZOLAM HCL 2 MG/2ML IJ SOLN
INTRAMUSCULAR | Status: DC | PRN
  Administered 2023-05-19: 1 mg via INTRAVENOUS

## 2023-05-19 MED ORDER — ACETAMINOPHEN 325 MG PO TABS: 650.0000 mg | ORAL_TABLET | ORAL | Status: DC | PRN

## 2023-05-19 MED ORDER — CLOPIDOGREL BISULFATE 300 MG PO TABS
ORAL_TABLET | ORAL | Status: DC | PRN
  Administered 2023-05-19: 300 mg via ORAL

## 2023-05-19 MED ORDER — SODIUM CHLORIDE 0.9 % IV SOLN: INTRAVENOUS | Status: DC

## 2023-05-19 MED ORDER — LIDOCAINE HCL (PF) 1 % IJ SOLN
INTRAMUSCULAR | Status: AC
  Filled 2023-05-19: qty 30

## 2023-05-19 MED ORDER — IODIXANOL 320 MG/ML IV SOLN
INTRAVENOUS | Status: DC | PRN
  Administered 2023-05-19: 130 mL via INTRA_ARTERIAL

## 2023-05-19 MED ORDER — ONDANSETRON HCL 4 MG/2ML IJ SOLN: 4.0000 mg | Freq: Four times a day (QID) | INTRAMUSCULAR | Status: DC | PRN

## 2023-05-19 MED ORDER — HEPARIN SODIUM (PORCINE) 1000 UNIT/ML IJ SOLN
INTRAMUSCULAR | Status: DC | PRN
  Administered 2023-05-19: 2000 [IU] via INTRAVENOUS
  Administered 2023-05-19 (×2): 5000 [IU] via INTRAVENOUS

## 2023-05-19 MED ORDER — ASPIRIN 81 MG PO CHEW: 81.0000 mg | CHEWABLE_TABLET | ORAL | Status: DC

## 2023-05-19 MED ORDER — FENTANYL CITRATE (PF) 100 MCG/2ML IJ SOLN
INTRAMUSCULAR | Status: AC
  Filled 2023-05-19: qty 2

## 2023-05-19 MED ORDER — HEPARIN (PORCINE) IN NACL 1000-0.9 UT/500ML-% IV SOLN
INTRAVENOUS | Status: DC | PRN
  Administered 2023-05-19: 1000 mL

## 2023-05-19 MED ORDER — MIDAZOLAM HCL 2 MG/2ML IJ SOLN
INTRAMUSCULAR | Status: AC
  Filled 2023-05-19: qty 2

## 2023-05-19 MED ORDER — LIDOCAINE HCL (PF) 1 % IJ SOLN
INTRAMUSCULAR | Status: DC | PRN
  Administered 2023-05-19: 15 mL

## 2023-05-19 SURGICAL SUPPLY — 25 items
BALLN STERLING SL OTW 4X80X150 (BALLOONS) ×2
BALLOON STRLNG SL OTW 4X80X150 (BALLOONS) IMPLANT
CATH ANGIO 5F PIGTAIL 65CM (CATHETERS) IMPLANT
CATH CROSS OVER TEMPO 5F (CATHETERS) IMPLANT
CATH QUICKCROSS .018X135CM (MICROCATHETER) IMPLANT
CATH STRAIGHT 5FR 65CM (CATHETERS) IMPLANT
DCB RANGER 5.0X100 135 (BALLOONS) IMPLANT
DCB RANGER 5.0X40 135 (BALLOONS) IMPLANT
DEVICE CLOSURE MYNXGRIP 6/7F (Vascular Products) IMPLANT
KIT ENCORE 26 ADVANTAGE (KITS) IMPLANT
KIT MICROPUNCTURE NIT STIFF (SHEATH) IMPLANT
KIT PV (KITS) ×2 IMPLANT
RANGER DCB 5.0X100 135 (BALLOONS) ×2
RANGER DCB 5.0X40 135 (BALLOONS) ×2
SHEATH CATAPULT 6F 45 MP (SHEATH) IMPLANT
SHEATH PINNACLE 5F 10CM (SHEATH) IMPLANT
SHEATH PINNACLE 6F 10CM (SHEATH) IMPLANT
SHEATH PROBE COVER 6X72 (BAG) IMPLANT
STOPCOCK MORSE 400PSI 3WAY (MISCELLANEOUS) IMPLANT
SYR MEDRAD MARK 7 150ML (SYRINGE) ×2 IMPLANT
TRANSDUCER W/STOPCOCK (MISCELLANEOUS) ×2 IMPLANT
TRAY PV CATH (CUSTOM PROCEDURE TRAY) ×2 IMPLANT
TUBING CIL FLEX 10 FLL-RA (TUBING) IMPLANT
WIRE HITORQ VERSACORE ST 145CM (WIRE) IMPLANT
WIRE SHEPHERD 6G .018 (WIRE) IMPLANT

## 2023-05-19 NOTE — Interval H&P Note (Signed)
History and Physical Interval Note:  05/19/2023 8:19 AM  Steven Hale.  has presented today for surgery, with the diagnosis of pad.  The various methods of treatment have been discussed with the patient and family. After consideration of risks, benefits and other options for treatment, the patient has consented to  Procedure(s): ABDOMINAL AORTOGRAM W/LOWER EXTREMITY (N/A) as a surgical intervention.  The patient's history has been reviewed, patient examined, no change in status, stable for surgery.  I have reviewed the patient's chart and labs.  Questions were answered to the patient's satisfaction.     Lorine Bears

## 2023-05-21 ENCOUNTER — Encounter (HOSPITAL_COMMUNITY): Payer: Self-pay | Admitting: Cardiovascular Disease

## 2023-06-03 ENCOUNTER — Ambulatory Visit (HOSPITAL_COMMUNITY): Admit: 2023-06-03 | Payer: No Typology Code available for payment source

## 2023-06-03 ENCOUNTER — Encounter (HOSPITAL_COMMUNITY): Payer: Self-pay

## 2023-06-03 ENCOUNTER — Other Ambulatory Visit (HOSPITAL_COMMUNITY): Payer: Self-pay

## 2023-06-03 MED ORDER — PREGABALIN 300 MG PO CAPS
300.0000 mg | ORAL_CAPSULE | Freq: Two times a day (BID) | ORAL | 1 refills | Status: DC
  Filled 2023-06-03 – 2023-08-02 (×3): qty 180, 90d supply, fill #0
  Filled 2023-08-08: qty 10, 5d supply, fill #0
  Filled 2023-08-09: qty 170, 85d supply, fill #0
  Filled 2023-11-01: qty 180, 90d supply, fill #1

## 2023-06-09 ENCOUNTER — Ambulatory Visit (HOSPITAL_COMMUNITY)
Admission: RE | Admit: 2023-06-09 | Discharge: 2023-06-09 | Disposition: A | Discharge status: Home / Self Care | Payer: No Typology Code available for payment source | Source: Ambulatory Visit | Attending: Cardiovascular Disease | Admitting: Cardiovascular Disease

## 2023-06-09 DIAGNOSIS — I739 Peripheral vascular disease, unspecified: Secondary | ICD-10-CM | POA: Insufficient documentation

## 2023-06-09 LAB — VAS US ABI WITH/WO TBI
Left ABI: 0.95
Right ABI: 0.87

## 2023-06-11 ENCOUNTER — Ambulatory Visit (HOSPITAL_COMMUNITY): Payer: No Typology Code available for payment source

## 2023-06-15 ENCOUNTER — Ambulatory Visit: Payer: No Typology Code available for payment source | Attending: Physician Assistant | Admitting: Physician Assistant

## 2023-06-15 NOTE — Addendum Note (Signed)
Addended by: Sandi Mariscal on: 06/15/2023 12:41 PM   Modules accepted: Orders

## 2023-06-16 ENCOUNTER — Other Ambulatory Visit (HOSPITAL_COMMUNITY): Payer: Self-pay

## 2023-06-16 ENCOUNTER — Other Ambulatory Visit: Payer: Self-pay

## 2023-06-17 ENCOUNTER — Other Ambulatory Visit (HOSPITAL_COMMUNITY): Payer: Self-pay

## 2023-06-17 ENCOUNTER — Other Ambulatory Visit: Payer: Self-pay

## 2023-06-17 MED ORDER — METFORMIN HCL ER 750 MG PO TB24
1500.0000 mg | ORAL_TABLET | Freq: Every day | ORAL | 3 refills | Status: DC
  Filled 2023-06-17: qty 180, 90d supply, fill #0
  Filled 2023-12-02: qty 180, 90d supply, fill #1

## 2023-06-17 MED ORDER — METFORMIN HCL ER 750 MG PO TB24
1500.0000 mg | ORAL_TABLET | Freq: Every day | ORAL | 3 refills | Status: DC
  Filled 2023-06-17 – 2023-09-16 (×2): qty 180, 90d supply, fill #0
  Filled 2023-12-02: qty 180, 90d supply, fill #1

## 2023-06-17 NOTE — Progress Notes (Signed)
This encounter was created in error - please disregard.

## 2023-06-18 ENCOUNTER — Other Ambulatory Visit (HOSPITAL_COMMUNITY): Payer: Self-pay

## 2023-07-24 ENCOUNTER — Other Ambulatory Visit (HOSPITAL_COMMUNITY): Payer: Self-pay

## 2023-07-27 ENCOUNTER — Other Ambulatory Visit (HOSPITAL_COMMUNITY): Payer: Self-pay

## 2023-08-02 ENCOUNTER — Other Ambulatory Visit: Payer: Self-pay

## 2023-08-03 ENCOUNTER — Other Ambulatory Visit (HOSPITAL_COMMUNITY): Payer: Self-pay

## 2023-08-03 ENCOUNTER — Other Ambulatory Visit: Payer: Self-pay

## 2023-08-08 ENCOUNTER — Other Ambulatory Visit (HOSPITAL_COMMUNITY): Payer: Self-pay

## 2023-08-09 ENCOUNTER — Other Ambulatory Visit (HOSPITAL_COMMUNITY): Payer: Self-pay

## 2023-08-10 ENCOUNTER — Other Ambulatory Visit: Payer: Self-pay | Admitting: Cardiovascular Disease

## 2023-08-10 ENCOUNTER — Other Ambulatory Visit: Payer: Self-pay

## 2023-08-16 ENCOUNTER — Other Ambulatory Visit (HOSPITAL_COMMUNITY): Payer: Self-pay

## 2023-08-16 MED ORDER — LISINOPRIL 2.5 MG PO TABS
2.5000 mg | ORAL_TABLET | Freq: Every morning | ORAL | 3 refills | Status: DC
  Filled 2023-08-16: qty 90, 90d supply, fill #0
  Filled 2023-11-01: qty 90, 90d supply, fill #1
  Filled 2023-12-02 – 2024-01-20 (×3): qty 90, 90d supply, fill #2
  Filled 2024-03-25 – 2024-04-08 (×2): qty 90, 90d supply, fill #3

## 2023-08-16 MED ORDER — DULOXETINE HCL 30 MG PO CPEP
30.0000 mg | ORAL_CAPSULE | Freq: Two times a day (BID) | ORAL | 3 refills | Status: DC
  Filled 2023-08-16: qty 180, 90d supply, fill #0
  Filled 2023-11-01: qty 180, 90d supply, fill #1
  Filled 2023-12-02 – 2024-01-20 (×3): qty 180, 90d supply, fill #2
  Filled 2024-03-25 – 2024-04-08 (×2): qty 180, 90d supply, fill #3
  Filled 2024-05-01 – 2024-05-29 (×2): qty 180, 90d supply, fill #4

## 2023-09-06 ENCOUNTER — Other Ambulatory Visit (HOSPITAL_COMMUNITY): Payer: Self-pay

## 2023-09-06 MED ORDER — LEVOTHYROXINE SODIUM 25 MCG PO TABS
25.0000 ug | ORAL_TABLET | Freq: Every day | ORAL | 3 refills | Status: DC
  Filled 2023-09-06: qty 90, 90d supply, fill #0
  Filled 2023-12-02: qty 90, 90d supply, fill #1

## 2023-09-06 MED ORDER — CLOPIDOGREL BISULFATE 75 MG PO TABS
75.0000 mg | ORAL_TABLET | Freq: Every morning | ORAL | 3 refills | Status: DC
  Filled 2023-09-06: qty 90, 90d supply, fill #0
  Filled 2023-12-02 – 2023-12-30 (×2): qty 90, 90d supply, fill #1
  Filled 2024-01-19 – 2024-03-25 (×2): qty 90, 90d supply, fill #2
  Filled 2024-04-08 – 2024-06-30 (×4): qty 90, 90d supply, fill #3

## 2023-09-08 ENCOUNTER — Other Ambulatory Visit (HOSPITAL_COMMUNITY): Payer: Self-pay

## 2023-09-16 ENCOUNTER — Other Ambulatory Visit (HOSPITAL_COMMUNITY): Payer: Self-pay

## 2023-09-17 ENCOUNTER — Other Ambulatory Visit (HOSPITAL_COMMUNITY): Payer: Self-pay

## 2023-09-20 ENCOUNTER — Other Ambulatory Visit (HOSPITAL_COMMUNITY): Payer: Self-pay

## 2023-09-20 MED ORDER — DICLOFENAC SODIUM 75 MG PO TBEC
75.0000 mg | DELAYED_RELEASE_TABLET | Freq: Two times a day (BID) | ORAL | 2 refills | Status: DC
  Filled 2023-09-20: qty 60, 30d supply, fill #0
  Filled 2024-01-31: qty 60, 30d supply, fill #1
  Filled 2024-02-26: qty 60, 30d supply, fill #2

## 2023-09-22 ENCOUNTER — Other Ambulatory Visit (HOSPITAL_COMMUNITY): Payer: Self-pay

## 2023-09-22 MED ORDER — MOXIFLOXACIN HCL 0.5 % OP SOLN
1.0000 [drp] | Freq: Four times a day (QID) | OPHTHALMIC | 0 refills | Status: AC
  Filled 2023-09-22 (×2): qty 3, 15d supply, fill #0

## 2023-09-27 ENCOUNTER — Other Ambulatory Visit (HOSPITAL_BASED_OUTPATIENT_CLINIC_OR_DEPARTMENT_OTHER): Payer: Self-pay

## 2023-09-28 ENCOUNTER — Other Ambulatory Visit (HOSPITAL_COMMUNITY): Payer: Self-pay

## 2023-09-28 MED ORDER — ATORVASTATIN CALCIUM 40 MG PO TABS
40.0000 mg | ORAL_TABLET | Freq: Every evening | ORAL | 3 refills | Status: DC
  Filled 2023-09-28 – 2024-01-20 (×5): qty 90, 90d supply, fill #0
  Filled 2024-03-25 – 2024-04-08 (×2): qty 90, 90d supply, fill #1
  Filled 2024-05-01 – 2024-05-29 (×2): qty 90, 90d supply, fill #2

## 2023-09-28 MED ORDER — SILDENAFIL CITRATE 100 MG PO TABS
ORAL_TABLET | ORAL | 3 refills | Status: DC
  Filled 2023-09-28: qty 90, 90d supply, fill #0
  Filled 2024-01-31: qty 90, 90d supply, fill #1
  Filled 2024-02-26: qty 10, 10d supply, fill #1

## 2023-09-28 MED ORDER — OZEMPIC (0.25 OR 0.5 MG/DOSE) 2 MG/3ML ~~LOC~~ SOPN
0.2500 mg | PEN_INJECTOR | SUBCUTANEOUS | 3 refills | Status: DC
  Filled 2023-09-28 – 2024-01-19 (×4): qty 9, 84d supply, fill #0
  Filled 2024-04-08 – 2024-05-29 (×4): qty 9, 84d supply, fill #1
  Filled 2024-08-28: qty 9, 84d supply, fill #2

## 2023-09-28 MED ORDER — LEVOTHYROXINE SODIUM 25 MCG PO TABS
25.0000 ug | ORAL_TABLET | Freq: Every morning | ORAL | 3 refills | Status: DC
  Filled 2023-09-28 – 2024-02-26 (×6): qty 90, 90d supply, fill #0
  Filled 2024-03-25 – 2024-05-29 (×4): qty 90, 90d supply, fill #1
  Filled 2024-08-28: qty 90, 90d supply, fill #2

## 2023-09-28 MED ORDER — ASPIRIN 81 MG PO TBEC
81.0000 mg | DELAYED_RELEASE_TABLET | Freq: Every day | ORAL | 3 refills | Status: DC
  Filled 2023-09-28: qty 90, 90d supply, fill #0
  Filled 2023-12-30: qty 90, 90d supply, fill #1
  Filled 2024-03-25: qty 90, 90d supply, fill #2
  Filled 2024-05-01 – 2024-08-28 (×3): qty 90, 90d supply, fill #3

## 2023-09-28 MED ORDER — METFORMIN HCL ER 750 MG PO TB24
1500.0000 mg | ORAL_TABLET | Freq: Every day | ORAL | 3 refills | Status: DC
  Filled 2023-09-28 – 2024-02-26 (×6): qty 180, 90d supply, fill #0
  Filled 2024-03-25 – 2024-05-29 (×4): qty 180, 90d supply, fill #1
  Filled 2024-08-28: qty 180, 90d supply, fill #2

## 2023-09-28 MED ORDER — EZETIMIBE 10 MG PO TABS
10.0000 mg | ORAL_TABLET | Freq: Every morning | ORAL | 3 refills | Status: DC
  Filled 2023-09-28 – 2024-01-19 (×4): qty 90, 90d supply, fill #0
  Filled 2024-03-25 – 2024-08-28 (×6): qty 90, 90d supply, fill #1

## 2023-09-28 MED ORDER — SYSTANE BALANCE 0.6 % OP SOLN
OPHTHALMIC | 3 refills | Status: AC
  Filled 2023-09-28: qty 60, 90d supply, fill #0
  Filled 2023-12-02: qty 60, fill #0
  Filled 2024-01-19: qty 60, 90d supply, fill #0
  Filled 2024-01-20: qty 30, 90d supply, fill #0
  Filled 2024-01-31: qty 60, 200d supply, fill #0
  Filled 2024-02-26 – 2024-03-25 (×2): qty 60, fill #0
  Filled 2024-05-01: qty 60, 30d supply, fill #0

## 2023-09-28 MED ORDER — PANTOPRAZOLE SODIUM 40 MG PO TBEC
40.0000 mg | DELAYED_RELEASE_TABLET | Freq: Every morning | ORAL | 3 refills | Status: DC
  Filled 2023-09-28: qty 90, 90d supply, fill #0
  Filled 2023-12-02 – 2024-01-31 (×4): qty 90, 90d supply, fill #1

## 2023-09-28 MED ORDER — JARDIANCE 25 MG PO TABS
25.0000 mg | ORAL_TABLET | Freq: Every morning | ORAL | 3 refills | Status: DC
  Filled 2023-09-28: qty 90, 90d supply, fill #0
  Filled 2023-12-02 – 2023-12-30 (×2): qty 90, 90d supply, fill #1
  Filled 2024-01-19 – 2024-03-25 (×2): qty 90, 90d supply, fill #2
  Filled 2024-04-08 – 2024-08-28 (×4): qty 90, 90d supply, fill #3

## 2023-09-28 MED ORDER — PREGABALIN 300 MG PO CAPS
300.0000 mg | ORAL_CAPSULE | Freq: Two times a day (BID) | ORAL | 2 refills | Status: DC
  Filled 2023-09-28 – 2023-12-02 (×2): qty 180, 90d supply, fill #0

## 2023-09-28 MED ORDER — MELOXICAM 15 MG PO TABS
15.0000 mg | ORAL_TABLET | Freq: Every morning | ORAL | 3 refills | Status: AC
  Filled 2023-09-28: qty 90, 90d supply, fill #0
  Filled 2023-12-30: qty 90, 90d supply, fill #1
  Filled 2024-01-19 – 2024-03-25 (×2): qty 90, 90d supply, fill #2
  Filled 2024-04-08 – 2024-06-30 (×4): qty 90, 90d supply, fill #3

## 2023-09-28 MED ORDER — METOPROLOL TARTRATE 25 MG PO TABS
25.0000 mg | ORAL_TABLET | Freq: Two times a day (BID) | ORAL | 3 refills | Status: DC
  Filled 2023-09-28 – 2024-01-31 (×5): qty 180, 90d supply, fill #0

## 2023-09-28 MED ORDER — TAMSULOSIN HCL 0.4 MG PO CAPS
0.4000 mg | ORAL_CAPSULE | Freq: Every evening | ORAL | 3 refills | Status: DC
  Filled 2023-09-28: qty 90, 90d supply, fill #0

## 2023-10-04 ENCOUNTER — Other Ambulatory Visit (HOSPITAL_COMMUNITY): Payer: Self-pay

## 2023-10-05 ENCOUNTER — Other Ambulatory Visit: Payer: Self-pay

## 2023-10-05 ENCOUNTER — Other Ambulatory Visit (HOSPITAL_COMMUNITY): Payer: Self-pay

## 2023-10-06 ENCOUNTER — Other Ambulatory Visit (HOSPITAL_COMMUNITY): Payer: Self-pay

## 2023-10-12 ENCOUNTER — Ambulatory Visit
Payer: No Typology Code available for payment source | Attending: Cardiovascular Disease | Admitting: Cardiovascular Disease

## 2023-10-18 ENCOUNTER — Other Ambulatory Visit (HOSPITAL_COMMUNITY): Payer: Self-pay

## 2023-10-19 ENCOUNTER — Other Ambulatory Visit (HOSPITAL_COMMUNITY): Payer: Self-pay

## 2023-10-20 ENCOUNTER — Other Ambulatory Visit (HOSPITAL_COMMUNITY): Payer: Self-pay

## 2023-10-24 ENCOUNTER — Emergency Department (HOSPITAL_COMMUNITY)
Admission: EM | Admit: 2023-10-24 | Discharge: 2023-10-24 | Disposition: A | Discharge status: Home / Self Care | Payer: No Typology Code available for payment source | Attending: Emergency Medicine | Admitting: Emergency Medicine

## 2023-10-24 ENCOUNTER — Other Ambulatory Visit: Payer: Self-pay

## 2023-10-24 DIAGNOSIS — D649 Anemia, unspecified: Secondary | ICD-10-CM | POA: Insufficient documentation

## 2023-10-24 DIAGNOSIS — I2510 Atherosclerotic heart disease of native coronary artery without angina pectoris: Secondary | ICD-10-CM | POA: Diagnosis not present

## 2023-10-24 DIAGNOSIS — E119 Type 2 diabetes mellitus without complications: Secondary | ICD-10-CM | POA: Insufficient documentation

## 2023-10-24 DIAGNOSIS — R040 Epistaxis: Secondary | ICD-10-CM | POA: Diagnosis present

## 2023-10-24 DIAGNOSIS — Z7902 Long term (current) use of antithrombotics/antiplatelets: Secondary | ICD-10-CM | POA: Insufficient documentation

## 2023-10-24 DIAGNOSIS — I10 Essential (primary) hypertension: Secondary | ICD-10-CM | POA: Diagnosis not present

## 2023-10-24 LAB — CBC
HCT: 38.3 % — ABNORMAL LOW (ref 39.0–52.0)
Hemoglobin: 12.7 g/dL — ABNORMAL LOW (ref 13.0–17.0)
MCH: 30.1 pg (ref 26.0–34.0)
MCHC: 33.2 g/dL (ref 30.0–36.0)
MCV: 90.8 fL (ref 80.0–100.0)
Platelets: 313 10*3/uL (ref 150–400)
RBC: 4.22 MIL/uL (ref 4.22–5.81)
RDW: 18.3 % — ABNORMAL HIGH (ref 11.5–15.5)
WBC: 4.1 10*3/uL (ref 4.0–10.5)
nRBC: 0.5 % — ABNORMAL HIGH (ref 0.0–0.2)

## 2023-10-24 MED ORDER — OXYMETAZOLINE HCL 0.05 % NA SOLN
1.0000 | Freq: Once | NASAL | Status: AC
  Administered 2023-10-24: 1 via NASAL
  Filled 2023-10-24: qty 30

## 2023-10-24 MED ORDER — LIDOCAINE-EPINEPHRINE (PF) 2 %-1:200000 IJ SOLN
20.0000 mL | Freq: Once | INTRAMUSCULAR | Status: AC
  Administered 2023-10-24: 20 mL
  Filled 2023-10-24: qty 20

## 2023-10-24 MED ORDER — SILVER NITRATE-POT NITRATE 75-25 % EX MISC
1.0000 | Freq: Once | CUTANEOUS | Status: AC
  Administered 2023-10-24: 1 via TOPICAL
  Filled 2023-10-24: qty 10

## 2023-10-24 NOTE — Discharge Instructions (Signed)
As we discussed, there are several techniques you can use to prevent or stop nosebleeds in the future.  Keep your nose moist either with saline spray several times a day or by applying a thin layer of Neosporin, bacitracin, or other antibiotic ointment to the inside of your nose once or twice a day.  If the bleeding starts up again, gently blow your nose into a tissue to clear the blood and clots, then apply 1-2 sprays to each affected nostril of over-the-counter Afrin nasal spray (oxymetazoline).   Then squeeze your nose shut tightly and DO NOT PEEK for at least 15 minutes.  This will resolve most nosebleeds.  If you continue to have trouble after trying these techniques, or anything seems out of the ordinary or concerns you, please return tot he Emergency Department.  

## 2023-10-24 NOTE — ED Provider Notes (Signed)
Emergency Department Provider Note   I have reviewed the triage vital signs and the nursing notes.   HISTORY  Chief Complaint Epistaxis   HPI Steven Hale. is a 67 y.o. male with past history reviewed below, on Plavix, presents to the emergency department with epistaxis starting at 5 AM this morning.  He has had blood from both nostrils but mainly on the right.  He was using his CPAP and felt like his nasal mucosa were getting dry.  He has been compliant with his medications including Plavix.  He had a pituitary tumor resected in 2020 and had a nosebleed after that procedure but nothing recurrent since then. Has tried holding pressure at home without relief.    Past Medical History:  Diagnosis Date   Anemia, unspecified    Bell's palsy    resolved - no deficits   Blood transfusion    during treatment for Ca   Blood transfusion without reported diagnosis    CAD (coronary artery disease)    a. s/p multiple PCIs;  b. s/p CABG in 09/2010 (LIMA-LAD, SVG-OM1, SVG-distal RCA/OM2);  Cardiac cath in 12/2015: Mild LAD disease, occluded mid LCX and proximal RCA (left to right collaterals), Occluded SVG to RCA and atretic LIMA. Patent SVG to OM   Chronic back pain    Diabetes mellitus without complication (HCC)    type 2   DJD (degenerative joint disease)    low back & all over    GERD (gastroesophageal reflux disease)    Helicobacter pylori (H. pylori) infection    Hemorrhoids    History of shingles    Hyperlipidemia    Hypertension    Impotence of organic origin    Myocardial infarction (HCC)    2005 and 2012    PAD (peripheral artery disease) (HCC)    a. s/p bilat SFA stents;  b. ABIs 11/2012: R 0.99, L 0.86.   Pancreatic cancer [facility])    surgery 2009 cyst removed   Pituitary adenoma [facility])    surgery at [facility] Feb 2020   Sleep apnea 06/05/2019   does not use cpap   Substance abuse (HCC)    drug and alcohol abuse    Review of Systems  Constitutional: No  fever/chills ENT: Positive epistaxis.  Cardiovascular: Denies chest pain. Respiratory: Denies shortness of breath. Gastrointestinal: No abdominal pain.  Neurological: Negative for headaches. ____________________________________________   PHYSICAL EXAM:  VITAL SIGNS: ED Triage Vitals  Encounter Vitals Group     BP 10/24/23 0859 (!) 141/92     Pulse Rate 10/24/23 0859 72     Resp 10/24/23 0859 18     Temp 10/24/23 0859 98.1 F (36.7 C)     Temp Source 10/24/23 0859 Oral     SpO2 10/24/23 0859 98 %     Weight 10/24/23 0859 212 lb (96.2 kg)     Height 10/24/23 0859 5\' 9"  (1.753 m)   Constitutional: Alert and oriented. Well appearing and in no acute distress. Eyes: Conjunctivae are normal.  Head: Atraumatic. Nose: Patient with dripping blood from the right nostril.  Large clot produced with blowing his nose.  No obvious blood from the left.  Mouth/Throat: Mucous membranes are moist.   Neck: No stridor.   Cardiovascular: Good peripheral circulation.  Respiratory: Normal respiratory effort.   Gastrointestinal: No distention.  Musculoskeletal:  No gross deformities of extremities. Neurologic:  Normal speech and language.  Skin:  Skin is warm, dry and intact. No rash noted.  ____________________________________________  LABS (all labs ordered are listed, but only abnormal results are displayed)  Labs Reviewed  CBC - Abnormal; Notable for the following components:      Result Value   Hemoglobin 12.7 (*)    HCT 38.3 (*)    RDW 18.3 (*)    nRBC 0.5 (*)    All other components within normal limits    ____________________________________________   PROCEDURES  Procedure(s) performed:   Epistaxis Management  Date/Time: 10/26/2023 12:31 PM  Performed by: Maia Plan, MD Authorized by: Maia Plan, MD   Consent:    Consent obtained:  Verbal   Consent given by:  Patient   Risks, benefits, and alternatives were discussed: yes     Risks discussed:  Bleeding,  infection, nasal injury and pain   Alternatives discussed:  No treatment Universal protocol:    Patient identity confirmed:  Verbally with patient Anesthesia:    Anesthesia method:  Topical application   Topical anesthetic:  Epinephrine and lidocaine gel Procedure details:    Treatment site:  R anterior and L anterior   Treatment method:  Silver nitrate   Treatment complexity:  Limited   Treatment episode: initial   Post-procedure details:    Assessment:  Bleeding stopped   Procedure completion:  Tolerated well, no immediate complications    ____________________________________________   INITIAL IMPRESSION / ASSESSMENT AND PLAN / ED COURSE  Pertinent labs & imaging results that were available during my care of the patient were reviewed by me and considered in my medical decision making (see chart for details).   This patient is Presenting for Evaluation of epistaxis, which does require a range of treatment options, and is a complaint that involves a moderate risk of morbidity and mortality.  The Differential Diagnoses include interior epistaxis, posterior epistaxis, symptomatic anemia, platelet dysfunction, etc.  Critical Interventions-    Medications  oxymetazoline (AFRIN) 0.05 % nasal spray 1 spray (1 spray Each Nare Given 10/24/23 0910)  lidocaine-EPINEPHrine (XYLOCAINE W/EPI) 2 %-1:200000 (PF) injection 20 mL (20 mLs Infiltration Given 10/24/23 0920)  silver nitrate applicators applicator 1 Application (1 Application Topical Given 10/24/23 0920)    Reassessment after intervention:  bleeding stopped.    Clinical Laboratory Tests Ordered, included CBC without severe anemia.   Medical Decision Making: Summary:  Presents emergency department with epistaxis.  Right nostril primarily.  Unable to see clear area of bleeding upon arrival.  Plan for lidocaine with epinephrine and reassess.   Reevaluation with update and discussion with patient. Discussed epistaxis mgmt at home with  strict ED return precautions.   Patient's presentation is most consistent with acute, uncomplicated illness.   Disposition: discharge  ____________________________________________  FINAL CLINICAL IMPRESSION(S) / ED DIAGNOSES  Final diagnoses:  Epistaxis    Note:  This document was prepared using Dragon voice recognition software and may include unintentional dictation errors.  Alona Bene, MD, [facility] Emergency Medicine    Jasmond River, Arlyss Repress, MD 10/26/23 [phone]

## 2023-10-24 NOTE — ED Triage Notes (Signed)
Pt arrived via POV. C/o epistaxis that began last night. Pt does use a CPAP and is on Plavix.  AOx4

## 2023-10-29 ENCOUNTER — Encounter (HOSPITAL_COMMUNITY): Payer: Self-pay | Admitting: Hospitalist

## 2023-11-01 ENCOUNTER — Other Ambulatory Visit (HOSPITAL_COMMUNITY): Payer: Self-pay

## 2023-11-02 ENCOUNTER — Other Ambulatory Visit (HOSPITAL_COMMUNITY): Payer: Self-pay | Admitting: Hospitalist

## 2023-11-02 DIAGNOSIS — C7A8 Other malignant neuroendocrine tumors: Secondary | ICD-10-CM

## 2023-11-09 ENCOUNTER — Encounter (HOSPITAL_COMMUNITY)
Admission: RE | Admit: 2023-11-09 | Discharge: 2023-11-09 | Disposition: A | Discharge status: Home / Self Care | Payer: No Typology Code available for payment source | Source: Ambulatory Visit | Attending: Hospitalist | Admitting: Hospitalist

## 2023-11-09 DIAGNOSIS — C7A8 Other malignant neuroendocrine tumors: Secondary | ICD-10-CM | POA: Diagnosis present

## 2023-11-09 MED ORDER — COPPER CU 64 DOTATATE 1 MCI/ML IV SOLN
4.0000 | Freq: Once | INTRAVENOUS | Status: AC
  Administered 2023-11-09: 3.91 via INTRAVENOUS

## 2023-12-02 ENCOUNTER — Other Ambulatory Visit (HOSPITAL_COMMUNITY): Payer: Self-pay

## 2023-12-06 ENCOUNTER — Other Ambulatory Visit (HOSPITAL_COMMUNITY): Payer: Self-pay

## 2023-12-06 MED ORDER — PREGABALIN 300 MG PO CAPS
300.0000 mg | ORAL_CAPSULE | Freq: Two times a day (BID) | ORAL | 1 refills | Status: DC
  Filled 2023-12-06 – 2024-01-31 (×2): qty 180, 90d supply, fill #0
  Filled 2024-03-25 – 2024-05-01 (×3): qty 180, 90d supply, fill #1

## 2023-12-07 ENCOUNTER — Other Ambulatory Visit: Payer: Self-pay

## 2023-12-07 ENCOUNTER — Other Ambulatory Visit (HOSPITAL_COMMUNITY): Payer: Self-pay

## 2023-12-11 ENCOUNTER — Other Ambulatory Visit (HOSPITAL_COMMUNITY): Payer: Self-pay

## 2023-12-21 ENCOUNTER — Ambulatory Visit: Payer: 59 | Attending: Cardiovascular Disease | Admitting: Cardiovascular Disease

## 2023-12-21 ENCOUNTER — Encounter: Payer: Self-pay | Admitting: Cardiovascular Disease

## 2023-12-21 VITALS — BP 118/78 | HR 57 | Ht 69.0 in | Wt 216.2 lb

## 2023-12-21 DIAGNOSIS — I739 Peripheral vascular disease, unspecified: Secondary | ICD-10-CM | POA: Diagnosis not present

## 2023-12-21 DIAGNOSIS — I25810 Atherosclerosis of coronary artery bypass graft(s) without angina pectoris: Secondary | ICD-10-CM | POA: Diagnosis not present

## 2023-12-21 DIAGNOSIS — E785 Hyperlipidemia, unspecified: Secondary | ICD-10-CM | POA: Diagnosis not present

## 2023-12-21 DIAGNOSIS — I10 Essential (primary) hypertension: Secondary | ICD-10-CM | POA: Diagnosis not present

## 2023-12-21 NOTE — Progress Notes (Signed)
 Cardiology Office Note   Date:  12/21/2023   ID:  Steven Savas., DOB 23-Jan-1956, MRN [id number]  PCP:  Delores Rojelio Caldron, NP  Cardiologist:  Dr. Ladena Johns chief complaint on file.     History of Present Illness: Steven Klumpp. is a 68 y.o. male who presents for a followup visit regarding peripheral arterial disease and coronary artery disease. He has known history of coronary artery disease with multiple interventions in the past. He had CABG in 2011.  He quit smoking in 2009.  He had pituitary adenoma resection. He has known history of peripheral arterial disease with intervention on both SFAs. Cardiac catheterization in January, 2017 showed mild nonobstructive LAD disease, occluded mid left circumflex and occluded proximal right coronary artery with left-to-right collaterals. SVG to RCA was occluded and LIMA to LAD was atretic. SVG to OM was normal. Abnormal stress test was due to chronically occluded right coronary artery and graft with left-to-right collaterals. His native RCA was not favorable for CTO PCI. I recommended medical therapy.  He had recurrent right leg claudication in 2022.  Angiography in April of 2022  showed no significant aortoiliac disease.  On the right side, there was severe calcified stenosis in the common femoral artery with patent SFA stent with moderate in-stent restenosis and two-vessel runoff below the knee.  On the left side, there was mild common femoral artery disease, patent SFA stent with minimal restenosis and two-vessel runoff below the knee. The patient underwent right common femoral artery endarterectomy by Dr. Magda in April 2022.  He had lobectomy done in March of 2023 by Dr. Shyrl and was diagnosed with carcinoid tumor.    He had recurrent right calf claudication last year.  I proceeded with angiography in July which showed significant stenosis in the ostial right SFA with occluded mid to distal stent, moderate popliteal artery  stenosis and three-vessel runoff below the knee.  I performed successful drug-coated balloon angioplasty of the right mid to distal SFA stent and drug-coated balloon angioplasty of the ostial SFA.  He has been doing well with no chest pain or shortness of breath.  He does have peripheral neuropathy but overall no convincing symptoms of calf claudication.  He continues to receive monthly injections for carcinoid tumor.  Past Medical History:  Diagnosis Date   Anemia, unspecified    Bell's palsy    resolved - no deficits   Blood transfusion    during treatment for Ca   Blood transfusion without reported diagnosis    CAD (coronary artery disease)    a. s/p multiple PCIs;  b. s/p CABG in 09/2010 (LIMA-LAD, SVG-OM1, SVG-distal RCA/OM2);  Cardiac cath in 12/2015: Mild LAD disease, occluded mid LCX and proximal RCA (left to right collaterals), Occluded SVG to RCA and atretic LIMA. Patent SVG to OM   Chronic back pain    Diabetes mellitus without complication (HCC)    type 2   DJD (degenerative joint disease)    low back & all over    GERD (gastroesophageal reflux disease)    Helicobacter pylori (H. pylori) infection    Hemorrhoids    History of shingles    Hyperlipidemia    Hypertension    Impotence of organic origin    Myocardial infarction (HCC)    2005 and 2012    PAD (peripheral artery disease) (HCC)    a. s/p bilat SFA stents;  b. ABIs 11/2012: R 0.99, L 0.86.   Pancreatic cancer (HCC)  surgery 2009 cyst removed   Pituitary adenoma [facility])    surgery at [facility] 2020   Sleep apnea 06/05/2019   does not use cpap   Substance abuse (HCC)    drug and alcohol  abuse    Past Surgical History:  Procedure Laterality Date   ABDOMINAL AORTAGRAM N/A 09/13/2013   Procedure: ABDOMINAL EZELLA;  Surgeon: Deatrice DELENA Cage, MD;  Location: [facility];  Service: Cardiovascular;  Laterality: N/A;   ABDOMINAL AORTAGRAM N/A 08/29/2014   Procedure: ABDOMINAL EZELLA;  Surgeon: Deatrice DELENA Cage, MD;  Location: [facility];  Service: Cardiovascular;  Laterality: N/A;   ABDOMINAL AORTOGRAM W/LOWER EXTREMITY N/A 02/19/2021   Procedure: ABDOMINAL AORTOGRAM W/LOWER EXTREMITY;  Surgeon: Cage Deatrice DELENA, MD;  Location: [facility] IN[facility]AS[facility];  Service: Cardiovascular;  Laterality: N/A;   ABDOMINAL AORTOGRAM W/LOWER EXTREMITY N/A 05/19/2023   Procedure: ABDOMINAL AORTOGRAM W/LOWER EXTREMITY;  Surgeon: Cage Deatrice DELENA, MD;  Location: [facility];  Service: Cardiovascular;  Laterality: N/A;   BACK SURGERY     1992   BRONCHIAL BIOPSY  01/13/2022   Procedure: BRONCHIAL BIOPSIES;  Surgeon: Brenna Adine CROME, DO;  Location: [facility];  Service: Pulmonary;;   BRONCHIAL BRUSHINGS  01/13/2022   Procedure: BRONCHIAL BRUSHINGS;  Surgeon: Brenna Adine CROME, DO;  Location: [facility];  Service: Pulmonary;;   BRONCHIAL NEEDLE ASPIRATION BIOPSY  01/13/2022   Procedure: BRONCHIAL NEEDLE ASPIRATION BIOPSIES;  Surgeon: Brenna Adine CROME, DO;  Location: [facility]OS[facility];  Service: Pulmonary;;   CARDIAC CATHETERIZATION N/A 12/25/2015   Procedure: Left Heart Cath and Cors/Grafts Angiography;  Surgeon: Deatrice DELENA Cage, MD;  [facility]: [facility];  Service: Cardiovascular;  Laterality: N/A;   COLONOSCOPY     CORONARY ARTERY BYPASS GRAFT  nov 2011   x 4   ENDARTERECTOMY FEMORAL Right 03/03/2021   Procedure: RIGHT FEMORAL ENDARTERECTOMY;  Surgeon: Magda Debby SAILOR, MD;  Location[facility] OR;  Service: Vascular;  Laterality: Right;   ESOPHAGOGASTRODUODENOSCOPY (EGD) WITH PROPOFOL  N/A 04/02/2017   Procedure: ESOPHAGOGASTRODUODENOSCOPY (EGD) WITH PROPOFOL ;  Surgeon: Rollin Dover, MD;  Location: [facility] ENDOS[facility];  Service: Endoscopy;  Laterality: N/A;   HEMORRHOID SURGERY  10/30/2011   Procedure: HEMORRHOIDECTOMY;  Surgeon: Vicenta DELENA Poli, MD;  Location: [facility] OR;  Service: General;  Laterality: N/A;   JOINT REPLACEMENT     pancreatic  cancer  05/10/2008   Resection of distal panrease and spleen   PARTIAL KNEE  ARTHROPLASTY Right 11/19/2014   Procedure: RIGHT UNI-KNEE ARTHROPLASTY MEDIALLY;  Surgeon: Donnice JONETTA Car, MD;  Location: [facility] ORS;  Service: Orthopedics;  Laterality: Right;   PATCH ANGIOPLASTY Right 03/03/2021   Procedure: PATCH ANGIOPLASTY;  Surgeon: Magda Debby SAILOR, MD;  Location: [facility] OR;  Service: Vascular;  Laterality: Right;   PERIPHERAL VASCULAR BALLOON ANGIOPLASTY  05/19/2023   Procedure: PERIPHERAL VASCULAR BALLOON ANGIOPLASTY;  Surgeon: Cage Deatrice DELENA, MD;  [facility];  Service: Cardiovascular;;  dcb   PERIPHERAL VASCULAR CATHETERIZATION  Oct 2014   Lt SFA PTA   PERIPHERAL VASCULAR CATHETERIZATION  Oct 2015   ISR Lt SFA-PTA   right partial knee replacement     splenectomy     THROMBECTOMY FEMORAL ARTERY Left 09/13/2013   Procedure: THROMBECTOMY FEMORAL ARTERY;  Surgeon: Deatrice DELENA Cage, MD;  [facility]: [facility];  Service: Cardiovascular;  Laterality: Left;   TRANSPHENOIDAL / TRANSNASAL HYPOPHYSECTOMY / RESECTION PITUITARY TUMOR  12/2018   [facility]   VIDEO BRONCHOSCOPY N/A 02/09/2022   Procedure: VIDEO BRONCHOSCOPY;  Surgeon: Shyrl Linnie KIDD, MD;  Location: [facility] OR;  Service: Thoracic;  Laterality: N/A;   VIDEO BRONCHOSCOPY WITH RADIAL ENDOBRONCHIAL ULTRASOUND  01/13/2022   Procedure: RADIAL ENDOBRONCHIAL ULTRASOUND;  Surgeon: Brenna Adine CROME, DO;  Location: [facility];  Service: Pulmonary;;     Current Outpatient Medications  Medication Sig Dispense Refill   aspirin  EC (ASPIRIN  LOW DOSE) 81 MG tablet Take 1 tablet (81 mg total) by mouth daily. 90 tablet 3   atorvastatin  (LIPITOR) 40 MG tablet Take 1 tablet (40 mg total) by mouth every evening. 90 tablet 3   clopidogrel  (PLAVIX ) 75 MG tablet Take 1 tablet (75 mg total) by mouth in the morning. 90 tablet 3   diclofenac  (VOLTAREN ) 75 MG EC tablet Take 1 tablet (75 mg total) by mouth 2 (two) times daily with meals 60 tablet 2   DULoxetine  (CYMBALTA ) 30 MG capsule Take 1 capsule (30 mg total) by mouth 2 (two) times  daily. 270 capsule 3   empagliflozin  (JARDIANCE ) 25 MG TABS tablet Take 1 tablet (25 mg total) by mouth in the morning. 90 tablet 3   ezetimibe  (ZETIA ) 10 MG tablet Take 1 tablet (10 mg total) by mouth in the morning. 90 tablet 3   fish oil-omega-3 fatty acids  1000 MG capsule Take 1 capsule (1 g total) by mouth daily. 60 capsule 1   levothyroxine  (SYNTHROID ) 25 MCG tablet Take 1 tablet (25 mcg total) by mouth in the morning before any other medications or food. 90 tablet 3   lisinopril  (ZESTRIL ) 2.5 MG tablet Take 1 tablet (2.5 mg total) by mouth in the morning. 90 tablet 3   meloxicam  (MOBIC ) 15 MG tablet Take 1 tablet (15 mg total) by mouth in the morning. 90 tablet 3   metFORMIN  (GLUCOPHAGE -XR) 750 MG 24 hr tablet Take 2 tablets (1,500 mg total) by mouth daily in the morning with food. 180 tablet 3   metoprolol  tartrate (LOPRESSOR ) 25 MG tablet Take 1 tablet (25 mg total) by mouth 2 (two) times daily in the morning and in the evening. 180 tablet 3   moxifloxacin  (VIGAMOX ) 0.5 % ophthalmic solution Place 1 drop into the left eye 4 (four) times daily. 3 mL 0   Multiple Vitamin (MULTIVITAMIN) tablet Take 1 tablet by mouth daily. 30 tablet 10   nitroGLYCERIN  (NITROSTAT ) 0.4 MG SL tablet Place 0.4 mg under the tongue every 2 (two) hours as needed for chest pain.     oxyCODONE  (OXY IR/ROXICODONE ) 5 MG immediate release tablet Take 1 tablet (5 mg total) by mouth every 4 (four) hours as needed for severe pain. 30 tablet 0   pantoprazole  (PROTONIX ) 40 MG tablet Take 1 tablet (40 mg total) by mouth in the morning. 90 tablet 3   pregabalin  (LYRICA ) 300 MG capsule Take 1 capsule (300 mg total) by mouth 2 (two) times daily. 180 capsule 1   Propylene Glycol (SYSTANE BALANCE) 0.6 % SOLN Use as directed everyday 60 mL 3   Semaglutide ,0.25 or 0.5MG /DOS, (OZEMPIC , 0.25 OR 0.5 MG/DOSE,) 2 MG/3ML SOPN Inject 0.25 mg into the skin once a week for 4 weeks then increase to 0.5 mg weekly 9 mL 3   sildenafil  (VIAGRA ) 100  MG tablet Take 1 tablet by mouth 1 hour before sexual activity. 90 tablet 3   tadalafil  (CIALIS ) 5 MG tablet Take 1 tablet (5 mg total) by mouth in the morning. (Patient not taking: Reported on 05/17/2023) 90 tablet 3   No current facility-administered medications for this visit.  Allergies:   Patient has no known allergies.    Social History:  The patient  reports that he quit smoking about 16 years ago. His smoking use included cigarettes. He started smoking about 34 years ago. He has a 18 pack-year smoking history. He has never used smokeless tobacco. He reports that he does not currently use alcohol . He reports that he does not currently use drugs.   Family History:  The patient's family history includes Cancer in his father and sister; Heart attack in his father.    ROS:  Please see the history of present illness.   Otherwise, review of systems are positive for none.   All other systems are reviewed and negative.    PHYSICAL EXAM: VS:  BP 118/78   Pulse (!) 57   Ht 5' 9 (1.753 m)   Wt 216 lb 3.2 oz (98.1 kg)   SpO2 92%   BMI 31.93 kg/m  , BMI Body mass index is 31.93 kg/m. GEN: Well nourished, well developed, in no acute distress  HEENT: normal  Neck: no JVD, carotid bruits, or masses Cardiac: RRR; no murmurs, rubs, or gallops,no edema  Respiratory:  clear to auscultation bilaterally, normal work of breathing GI: soft, nontender, nondistended, + BS MS: no deformity or atrophy  Skin: warm and dry, no rash Neuro:  Strength and sensation are intact Psych: euthymic mood, full affect Femoral: +2 bilaterally.  He has palpable posterior tibial pulse on the left and a palpable dorsalis pedis on the right.  EKG:  EKG is ordered today. EKG showed: Sinus bradycardia       Recent Labs: 05/11/2023: BUN 14; Creatinine, Ser 0.97; Potassium 5.2; Sodium 139 10/24/2023: Hemoglobin 12.7; Platelets 313    Lipid Panel    Component Value Date/Time   CHOL 104 03/04/2021 0535   CHOL  107 08/09/2020 1549   TRIG 87 03/04/2021 0535   HDL 36 (L) 03/04/2021 0535   HDL 33 (L) 08/09/2020 1549   CHOLHDL 2.9 03/04/2021 0535   VLDL 17 03/04/2021 0535   LDLCALC 51 03/04/2021 0535   LDLCALC 49 08/09/2020 1549   LDLDIRECT 80 11/06/2013 0925      Wt Readings from Last 3 Encounters:  12/21/23 216 lb 3.2 oz (98.1 kg)  10/24/23 212 lb (96.2 kg)  05/19/23 212 lb (96.2 kg)        ASSESSMENT AND PLAN:   1. Peripheral arterial disease: He is doing well overall with minimal calf claudication post reintervention on the right SFA last year.  He does have palpable dorsalis pedis pulse.  He is scheduled for follow-up ABI and duplex later this month.  Continue long-term dual antiplatelet therapy as tolerated.   2. Peripheral diabetic neuropathy: Currently on Lyrica  with improvement in symptoms.  3. Coronary artery disease involving native coronary arteries with stable angina: Chronic atypical chest pain.  His symptoms are overall stable.    4. Essential hypertension: Blood pressure is controlled on current medications.  5. Hyperlipidemia: Most recent lipid profile from 2022 showed an LDL of 51.  His LDL is at target on atorvastatin  and ezetimibe .     Disposition: Follow-up in 6 months.  Signed,  Deatrice Cage, MD  12/21/2023 8:42 AM    [facility]

## 2023-12-21 NOTE — Patient Instructions (Signed)
 Medication Instructions:  No changes *If you need a refill on your cardiac medications before your next appointment, please call your pharmacy*   Lab Work: None ordered If you have labs (blood work) drawn today and your tests are completely normal, you will receive your results only by: MyChart Message (if you have MyChart) OR A paper copy in the mail If you have any lab test that is abnormal or we need to change your treatment, we will call you to review the results.   Testing/Procedures: None ordered   Follow-Up: At [facility], you and your health needs are our priority.  As part of our continuing mission to provide you with exceptional heart care, we have created designated Provider Care Teams.  These Care Teams include your primary Cardiologist (physician) and Advanced Practice Providers (APPs -  Physician Assistants and Nurse Practitioners) who all work together to provide you with the care you need, when you need it.  We recommend signing up for the patient portal called "MyChart".  Sign up information is provided on this After Visit Summary.  MyChart is used to connect with patients for Virtual Visits (Telemedicine).  Patients are able to view lab/test results, encounter notes, upcoming appointments, etc.  Non-urgent messages can be sent to your provider as well.   To learn more about what you can do with MyChart, go to [email or web address].    Your next appointment:   6 month(s)  Provider:   Dr. Kirke Corin

## 2023-12-22 ENCOUNTER — Other Ambulatory Visit: Payer: Self-pay

## 2023-12-22 ENCOUNTER — Emergency Department (HOSPITAL_BASED_OUTPATIENT_CLINIC_OR_DEPARTMENT_OTHER)
Admission: EM | Admit: 2023-12-22 | Discharge: 2023-12-22 | Discharge status: Against Medical Advice | Payer: No Typology Code available for payment source

## 2023-12-22 NOTE — ED Notes (Signed)
 No answer

## 2023-12-23 ENCOUNTER — Other Ambulatory Visit (HOSPITAL_COMMUNITY): Payer: Self-pay

## 2023-12-23 MED ORDER — IBUPROFEN 800 MG PO TABS
ORAL_TABLET | ORAL | 0 refills | Status: AC
  Filled 2023-12-23: qty 28, 7d supply, fill #0

## 2023-12-23 MED ORDER — ONDANSETRON 4 MG PO TBDP
ORAL_TABLET | ORAL | 0 refills | Status: AC
  Filled 2023-12-23: qty 12, 4d supply, fill #0

## 2023-12-23 MED ORDER — FAMOTIDINE 20 MG PO TABS
ORAL_TABLET | ORAL | 0 refills | Status: DC
  Filled 2023-12-23: qty 14, 7d supply, fill #0

## 2023-12-23 MED ORDER — ACETAMINOPHEN 500 MG PO TABS
ORAL_TABLET | ORAL | 0 refills | Status: AC
  Filled 2023-12-23: qty 35, 7d supply, fill #0

## 2023-12-30 ENCOUNTER — Other Ambulatory Visit: Payer: Self-pay

## 2023-12-30 ENCOUNTER — Other Ambulatory Visit (HOSPITAL_COMMUNITY): Payer: Self-pay

## 2024-01-06 ENCOUNTER — Other Ambulatory Visit: Payer: Self-pay | Admitting: Urology

## 2024-01-06 ENCOUNTER — Telehealth: Payer: Self-pay | Admitting: Cardiology

## 2024-01-06 NOTE — Telephone Encounter (Signed)
   Pre-operative Risk Assessment    Patient Name: Steven Hale.  DOB: 03-20-1956 MRN: [id number]      Request for Surgical Clearance    Procedure:  INSERTION OF MALLEABLE PENILE PROSTHESIS   Date of Surgery:  Clearance 03/07/24                                 Surgeon:  Dr. Traci Sermon  Surgeon's Group or Practice Name:  [facility]  Phone number:  [phone] Fax number:  [phone]   Type of Clearance Requested:   - Medical  - Pharmacy:  Hold Aspirin and Clopidogrel (Plavix) 5 day hold for both    Type of Anesthesia:  General    Additional requests/questions:    Signed, April Henson   01/06/2024, 10:34 AM

## 2024-01-06 NOTE — Telephone Encounter (Signed)
   Patient Name: Steven Hale.  DOB: 1956/11/11 MRN: [id number]  Primary Cardiologist: Olga Millers, MD  Chart reviewed as part of pre-operative protocol coverage. Given past medical history and time since last visit, based on ACC/AHA guidelines, Feliciano Wynter. is at acceptable risk for the planned procedure without further cardiovascular testing.   Per office protocol, if patient is without any new symptoms or concerns at the time of their virtual visit, he/she may hold ASA for 7 days and Plavix for 5 days  prior to procedure. Please resume ASA and Plavix as soon as possible postprocedure, at the discretion of the surgeon.    The patient was advised that if he develops new symptoms prior to surgery to contact our office to arrange for a follow-up visit, and he verbalized understanding.  I will route this recommendation to the requesting party via Epic fax function and remove from pre-op pool.  Please call with questions.  Joni Reining, NP 01/06/2024, 1:32 PM

## 2024-01-11 ENCOUNTER — Encounter (HOSPITAL_COMMUNITY): Payer: Self-pay

## 2024-01-11 ENCOUNTER — Ambulatory Visit (HOSPITAL_COMMUNITY)
Admission: RE | Admit: 2024-01-11 | Discharge: 2024-01-11 | Disposition: A | Discharge status: Home / Self Care | Payer: No Typology Code available for payment source | Source: Ambulatory Visit | Attending: Cardiology | Admitting: Cardiology

## 2024-01-11 ENCOUNTER — Ambulatory Visit (HOSPITAL_COMMUNITY)
Admission: RE | Admit: 2024-01-11 | Discharge: 2024-01-11 | Disposition: A | Discharge status: Home / Self Care | Payer: No Typology Code available for payment source | Source: Ambulatory Visit | Attending: Cardiovascular Disease | Admitting: Cardiovascular Disease

## 2024-01-11 DIAGNOSIS — I739 Peripheral vascular disease, unspecified: Secondary | ICD-10-CM

## 2024-01-19 ENCOUNTER — Other Ambulatory Visit (HOSPITAL_COMMUNITY): Payer: Self-pay

## 2024-01-19 MED ORDER — PREDNISOLONE ACETATE 1 % OP SUSP
1.0000 [drp] | Freq: Three times a day (TID) | OPHTHALMIC | 0 refills | Status: DC
  Filled 2024-01-19: qty 10, 67d supply, fill #0

## 2024-01-20 ENCOUNTER — Other Ambulatory Visit: Payer: Self-pay

## 2024-01-20 ENCOUNTER — Other Ambulatory Visit (HOSPITAL_COMMUNITY): Payer: Self-pay

## 2024-01-21 ENCOUNTER — Other Ambulatory Visit (HOSPITAL_COMMUNITY): Payer: Self-pay

## 2024-01-27 ENCOUNTER — Other Ambulatory Visit (HOSPITAL_COMMUNITY): Payer: Self-pay

## 2024-01-31 ENCOUNTER — Other Ambulatory Visit (HOSPITAL_COMMUNITY): Payer: Self-pay

## 2024-01-31 MED ORDER — TADALAFIL 5 MG PO TABS
5.0000 mg | ORAL_TABLET | Freq: Every morning | ORAL | 3 refills | Status: AC
  Filled 2024-01-31: qty 90, 90d supply, fill #0
  Filled 2024-03-25: qty 30, 30d supply, fill #1
  Filled 2024-04-08 – 2024-05-01 (×3): qty 30, 30d supply, fill #2
  Filled 2024-05-29: qty 30, 30d supply, fill #3
  Filled 2024-08-28: qty 30, 30d supply, fill #4
  Filled 2024-11-14: qty 30, 30d supply, fill #5

## 2024-02-01 ENCOUNTER — Other Ambulatory Visit (HOSPITAL_COMMUNITY): Payer: Self-pay

## 2024-02-01 ENCOUNTER — Other Ambulatory Visit: Payer: Self-pay

## 2024-02-01 MED ORDER — PREDNISOLONE ACETATE 1 % OP SUSP
1.0000 [drp] | Freq: Three times a day (TID) | OPHTHALMIC | 0 refills | Status: DC
  Filled 2024-02-01 – 2024-02-26 (×2): qty 10, 67d supply, fill #0

## 2024-02-05 ENCOUNTER — Other Ambulatory Visit: Payer: Self-pay | Admitting: Cardiovascular Disease

## 2024-02-18 ENCOUNTER — Ambulatory Visit (HOSPITAL_BASED_OUTPATIENT_CLINIC_OR_DEPARTMENT_OTHER)
Admission: RE | Admit: 2024-02-18 | Discharge: 2024-02-18 | Disposition: A | Discharge status: Home / Self Care | Source: Ambulatory Visit | Attending: Cardiovascular Disease | Admitting: Cardiovascular Disease

## 2024-02-18 ENCOUNTER — Ambulatory Visit (HOSPITAL_COMMUNITY)
Admission: RE | Admit: 2024-02-18 | Discharge: 2024-02-18 | Disposition: A | Discharge status: Home / Self Care | Source: Ambulatory Visit | Attending: Cardiovascular Disease | Admitting: Cardiovascular Disease

## 2024-02-18 DIAGNOSIS — I739 Peripheral vascular disease, unspecified: Secondary | ICD-10-CM | POA: Insufficient documentation

## 2024-02-18 LAB — VAS US ABI WITH/WO TBI
Left ABI: 1
Right ABI: 0.94

## 2024-02-21 ENCOUNTER — Other Ambulatory Visit (HOSPITAL_COMMUNITY): Payer: Self-pay | Admitting: *Deleted

## 2024-02-21 DIAGNOSIS — I739 Peripheral vascular disease, unspecified: Secondary | ICD-10-CM

## 2024-02-28 ENCOUNTER — Encounter (HOSPITAL_COMMUNITY): Admission: RE | Admit: 2024-02-28 | Source: Ambulatory Visit

## 2024-02-28 ENCOUNTER — Other Ambulatory Visit (HOSPITAL_COMMUNITY): Payer: Self-pay

## 2024-02-28 ENCOUNTER — Telehealth: Payer: Self-pay | Admitting: Cardiovascular Disease

## 2024-02-28 NOTE — Telephone Encounter (Signed)
 Steven Hale walked in 02/28/24. Would like for you all to talk to him about his cancellation for pre surg that was  cancel at [facility] long. Patient wants to know why it was cancelled. He had testing done on 02/18/24.

## 2024-02-28 NOTE — Telephone Encounter (Signed)
Unable to reach pt or leave a message  

## 2024-02-28 NOTE — Telephone Encounter (Signed)
 Spoke with pt, Follow up scheduled

## 2024-02-28 NOTE — Telephone Encounter (Signed)
 Please add him to my schedule on Tuesday to see what is going on with him.

## 2024-02-28 NOTE — Telephone Encounter (Signed)
 Patient walked in the lobby asking why his surgery was cancelled. Spoke with pt, he reports that he was supposed to have angiogram with dr Alvenia Aus and it was cancelled. According to the chart,  had cancelled pre-op testing for urology surgery that had also been cancelled. He reports he is having severe cramping of his right leg as he did previously and dr Alvenia Aus had done a procedure to help that. Explained to the patient the dopplers he had done on 02/18/24 were normal, so there is no reason for a procedure. Patient insists that the dopplers can not be right because he is having the same symptoms as he did before. He feels that the person that did the dopplers was new and did not do the testing right. Explained to the patient that I would send a message to dr Alvenia Aus to let him know.

## 2024-02-28 NOTE — Telephone Encounter (Signed)
 Left a message for the patient to call back.

## 2024-03-03 ENCOUNTER — Other Ambulatory Visit: Payer: Self-pay

## 2024-03-06 ENCOUNTER — Other Ambulatory Visit (HOSPITAL_COMMUNITY): Payer: Self-pay

## 2024-03-06 NOTE — Progress Notes (Deleted)
 Cardiology Office Note   Date:  03/06/2024   ID:  Steven Gilreath., DOB [birth year], MRN [id number]  PCP:  Carolyn Cisco, NP  Cardiologist:  Dr. Ilean Mall chief complaint on file.     History of Present Illness: Steven Ake. is a 68 y.o. male who presents for a followup visit regarding peripheral arterial disease and coronary artery disease. He has known history of coronary artery disease with multiple interventions in the past. He had CABG in 2011.  He quit smoking in 2009.  He had pituitary adenoma resection. He has known history of peripheral arterial disease with intervention on both SFAs. Cardiac catheterization in January, 2017 showed mild nonobstructive LAD disease, occluded mid left circumflex and occluded proximal right coronary artery with left-to-right collaterals. SVG to RCA was occluded and LIMA to LAD was atretic. SVG to OM was normal. Abnormal stress test was due to chronically occluded right coronary artery and graft with left-to-right collaterals. His native RCA was not favorable for CTO PCI. I recommended medical therapy.  He had recurrent right leg claudication in 2022.  Angiography in April of 2022  showed no significant aortoiliac disease.  On the right side, there was severe calcified stenosis in the common femoral artery with patent SFA stent with moderate in-stent restenosis and two-vessel runoff below the knee.  On the left side, there was mild common femoral artery disease, patent SFA stent with minimal restenosis and two-vessel runoff below the knee. The patient underwent right common femoral artery endarterectomy by Dr. Edgardo Goodwill in April 2022.  He had lobectomy done in March of 2023 by Dr. Deloise Ferries and was diagnosed with carcinoid tumor.    He had recurrent right calf claudication last year.  I proceeded with angiography in July which showed significant stenosis in the ostial right SFA with occluded mid to distal stent, moderate popliteal  artery stenosis and three-vessel runoff below the knee.  I performed successful drug-coated balloon angioplasty of the right mid to distal SFA stent and drug-coated balloon angioplasty of the ostial SFA.  Most recent vascular testing earlier this month showed an ABI of 0.94 on the right and 1.0 on the left.  Duplex showed patent SFA stents.  Past Medical History:  Diagnosis Date   Anemia, unspecified    Bell's palsy    resolved - no deficits   Blood transfusion    during treatment for Ca   Blood transfusion without reported diagnosis    CAD (coronary artery disease)    a. s/p multiple PCIs;  b. s/p CABG in 09/2010 (LIMA-LAD, SVG-OM1, SVG-distal RCA/OM2);  Cardiac cath in 12/2015: Mild LAD disease, occluded mid LCX and proximal RCA (left to right collaterals), Occluded SVG to RCA and atretic LIMA. Patent SVG to OM   Chronic back pain    Diabetes mellitus without complication (HCC)    type 2   DJD (degenerative joint disease)    low back & all over    GERD (gastroesophageal reflux disease)    Helicobacter pylori (H. pylori) infection    Hemorrhoids    History of shingles    Hyperlipidemia    Hypertension    Impotence of organic origin    Myocardial infarction (HCC)    2005 and 2012    PAD (peripheral artery disease) (HCC)    a. s/p bilat SFA stents;  b. ABIs 11/2012: R 0.99, L 0.86.   Pancreatic cancer [facility])    surgery 2009 cyst removed   Pituitary adenoma (HCC)  surgery at [facility] Feb 2020   Sleep apnea 06/05/2019   does not use cpap   Substance abuse (HCC)    drug and alcohol  abuse    Past Surgical History:  Procedure Laterality Date   ABDOMINAL AORTAGRAM N/A 09/13/2013   Procedure: ABDOMINAL Tommi Fraise;  Surgeon: Wenona Hamilton, MD;  [facility] CATH LAB;  Service: Cardiovascular;  Laterality: N/A;   ABDOMINAL AORTAGRAM N/A 08/29/2014   Procedure: ABDOMINAL Tommi Fraise;  Surgeon: Wenona Hamilton, MD;  Location: [facility] CATH LAB;  Service: Cardiovascular;  Laterality: N/A;    ABDOMINAL AORTOGRAM W/LOWER EXTREMITY N/A 02/19/2021   Procedure: ABDOMINAL AORTOGRAM W/LOWER EXTREMITY;  Surgeon: Wenona Hamilton, MD;  Location: [facility] INVASIVE CV LAB;  Service: Cardiovascular;  Laterality: N/A;   ABDOMINAL AORTOGRAM W/LOWER EXTREMITY N/A 05/19/2023   Procedure: ABDOMINAL AORTOGRAM W/LOWER EXTREMITY;  Surgeon: Wenona Hamilton, MD;  Location: [facility] INVASIVE CV LAB;  Service: Cardiovascular;  Laterality: N/A;   BACK SURGERY     1992   BRONCHIAL BIOPSY  01/13/2022   Procedure: BRONCHIAL BIOPSIES;  Surgeon: Prudy Brownie, DO;  Location: [facility] ENDOSCOPY;  Service: Pulmonary;;   BRONCHIAL BRUSHINGS  01/13/2022   Procedure: BRONCHIAL BRUSHINGS;  Surgeon: Prudy Brownie, DO;  Location: [facility] ENDOSCOPY;  Service: Pulmonary;;   BRONCHIAL NEEDLE ASPIRATION BIOPSY  01/13/2022   Procedure: BRONCHIAL NEEDLE ASPIRATION BIOPSIES;  Surgeon: Prudy Brownie, DO;  Location: [facility] ENDOSCOPY;  Service: Pulmonary;;   CARDIAC CATHETERIZATION N/A 12/25/2015   Procedure: Left Heart Cath and Cors/Grafts Angiography;  Surgeon: Wenona Hamilton, MD;  Location: [facility] INVASIVE CV LAB;  Service: Cardiovascular;  Laterality: N/A;   COLONOSCOPY     CORONARY ARTERY BYPASS GRAFT  nov 2011   x 4   ENDARTERECTOMY FEMORAL Right 03/03/2021   Procedure: RIGHT FEMORAL ENDARTERECTOMY;  Surgeon: Carlene Che, MD;  Location: [facility] OR;  Service: Vascular;  Laterality: Right;   ESOPHAGOGASTRODUODENOSCOPY (EGD) WITH PROPOFOL  N/A 04/02/2017   Procedure: ESOPHAGOGASTRODUODENOSCOPY (EGD) WITH PROPOFOL ;  Surgeon: Alvis Jourdain, MD;  Location: [facility] ENDOSCOPY;  Service: Endoscopy;  Laterality: N/A;   HEMORRHOID SURGERY  10/30/2011   Procedure: HEMORRHOIDECTOMY;  Surgeon: Rogena Class, MD;  Location: [facility] OR;  Service: General;  Laterality: N/A;   JOINT REPLACEMENT     pancreatic  cancer  05/10/2008   Resection of distal panrease and spleen   PARTIAL KNEE ARTHROPLASTY Right 11/19/2014   Procedure: RIGHT UNI-KNEE ARTHROPLASTY MEDIALLY;   Surgeon: Bevin Bucks, MD;  Location: [facility] ORS;  Service: Orthopedics;  Laterality: Right;   PATCH ANGIOPLASTY Right 03/03/2021   Procedure: PATCH ANGIOPLASTY;  Surgeon: Carlene Che, MD;  Location: [facility] OR;  Service: Vascular;  Laterality: Right;   PERIPHERAL VASCULAR BALLOON ANGIOPLASTY  05/19/2023   Procedure: PERIPHERAL VASCULAR BALLOON ANGIOPLASTY;  Surgeon: Wenona Hamilton, MD;  Location: [facility] INVASIVE CV LAB;  Service: Cardiovascular;;  dcb   PERIPHERAL VASCULAR CATHETERIZATION  Oct 2014   Lt SFA PTA   PERIPHERAL VASCULAR CATHETERIZATION  Oct 2015   ISR Lt SFA-PTA   right partial knee replacement     splenectomy     THROMBECTOMY FEMORAL ARTERY Left 09/13/2013   Procedure: THROMBECTOMY FEMORAL ARTERY;  Surgeon: Wenona Hamilton, MD;  Location: [facility] CATH LAB;  Service: Cardiovascular;  Laterality: Left;   TRANSPHENOIDAL / TRANSNASAL HYPOPHYSECTOMY / RESECTION PITUITARY TUMOR  12/2018   [facility]   VIDEO BRONCHOSCOPY N/A 02/09/2022   Procedure: VIDEO BRONCHOSCOPY;  Surgeon: Hilarie Lovely, MD;  Location: [facility] OR;  Service: Thoracic;  Laterality: N/A;   VIDEO BRONCHOSCOPY WITH RADIAL ENDOBRONCHIAL ULTRASOUND  01/13/2022   Procedure: RADIAL ENDOBRONCHIAL ULTRASOUND;  Surgeon: Prudy Brownie, DO;  Location: [facility] ENDOSCOPY;  Service: Pulmonary;;     Current Outpatient Medications  Medication Sig Dispense Refill   acetaminophen  (TYLENOL ) 500 MG tablet Take two tablets (1,000 mg dose) by mouth every 6 (six) hours as needed for Pain or Fever for up to 7 days. 35 tablet 0   aspirin  EC (ASPIRIN  LOW DOSE) 81 MG tablet Take 1 tablet (81 mg total) by mouth daily. 90 tablet 3   atorvastatin  (LIPITOR) 40 MG tablet Take 1 tablet (40 mg total) by mouth every evening. 90 tablet 3   clopidogrel  (PLAVIX ) 75 MG tablet Take 1 tablet (75 mg total) by mouth in the morning. 90 tablet 3   diclofenac  (VOLTAREN ) 75 MG EC tablet Take 1 tablet (75 mg total) by mouth 2 (two) times daily with meals 60 tablet 2    DULoxetine  (CYMBALTA ) 30 MG capsule Take 1 capsule (30 mg total) by mouth 2 (two) times daily. 270 capsule 3   empagliflozin  (JARDIANCE ) 25 MG TABS tablet Take 1 tablet (25 mg total) by mouth in the morning. 90 tablet 3   ezetimibe  (ZETIA ) 10 MG tablet Take 1 tablet (10 mg total) by mouth in the morning. 90 tablet 3   famotidine  (PEPCID ) 20 MG tablet Take one tablet (20 mg dose) by mouth 2 (two) times daily for 7 days. 14 tablet 0   fish oil-omega-3 fatty acids  1000 MG capsule Take 1 capsule (1 g total) by mouth daily. 60 capsule 1   ibuprofen  (ADVIL ) 800 MG tablet Take one tablet (800 mg dose) by mouth every 6 (six) hours as needed for Pain or Fever for up to 7 days. Take with food to avoid GI upset 28 tablet 0   levothyroxine  (SYNTHROID ) 25 MCG tablet Take 1 tablet (25 mcg total) by mouth in the morning before any other medications or food. 90 tablet 3   lisinopril  (ZESTRIL ) 2.5 MG tablet Take 1 tablet (2.5 mg total) by mouth in the morning. 90 tablet 3   meloxicam  (MOBIC ) 15 MG tablet Take 1 tablet (15 mg total) by mouth in the morning. 90 tablet 3   metFORMIN  (GLUCOPHAGE -XR) 750 MG 24 hr tablet Take 2 tablets (1,500 mg total) by mouth daily in the morning with food. 180 tablet 3   metoprolol  tartrate (LOPRESSOR ) 25 MG tablet TAKE 1 TABLET BY MOUTH TWICE  DAILY 200 tablet 2   moxifloxacin  (VIGAMOX ) 0.5 % ophthalmic solution Place 1 drop into the left eye 4 (four) times daily. 3 mL 0   Multiple Vitamin (MULTIVITAMIN) tablet Take 1 tablet by mouth daily. 30 tablet 10   nitroGLYCERIN  (NITROSTAT ) 0.4 MG SL tablet Place 0.4 mg under the tongue every 2 (two) hours as needed for chest pain.     ondansetron  (ZOFRAN -ODT) 4 MG disintegrating tablet Take one tablet (4 mg dose) by mouth every 8 (eight) hours as needed for Nausea for up to 7 days. 12 tablet 0   oxyCODONE  (OXY IR/ROXICODONE ) 5 MG immediate release tablet Take 1 tablet (5 mg total) by mouth every 4 (four) hours as needed for severe pain. 30  tablet 0   pantoprazole  (PROTONIX ) 40 MG tablet TAKE 1 TABLET BY MOUTH DAILY 100 tablet 2   prednisoLONE  acetate (PRED FORTE ) 1 % ophthalmic suspension Place 1 drop into the left eye 3 (three) times daily for 3 weeks 10 mL 0  pregabalin  (LYRICA ) 300 MG capsule Take 1 capsule (300 mg total) by mouth 2 (two) times daily. 180 capsule 1   Propylene Glycol (SYSTANE BALANCE) 0.6 % SOLN Use as directed everyday 60 mL 3   Semaglutide ,0.25 or 0.5MG /DOS, (OZEMPIC , 0.25 OR 0.5 MG/DOSE,) 2 MG/3ML SOPN Inject 0.25 mg into the skin once a week for 4 weeks then increase to 0.5 mg weekly 9 mL 3   tadalafil  (CIALIS ) 5 MG tablet Take 1 tablet (5 mg total) by mouth in the morning. 90 tablet 3   No current facility-administered medications for this visit.    Allergies:   Patient has no known allergies.    Social History:  The patient  reports that he quit smoking about 16 years ago. His smoking use included cigarettes. He started smoking about 34 years ago. He has a 18 pack-year smoking history. He has never used smokeless tobacco. He reports that he does not currently use alcohol . He reports that he does not currently use drugs.   Family History:  The patient's family history includes Cancer in his father and sister; Heart attack in his father.    ROS:  Please see the history of present illness.   Otherwise, review of systems are positive for none.   All other systems are reviewed and negative.    PHYSICAL EXAM: VS:  There were no vitals taken for this visit. , BMI There is no height or weight on file to calculate BMI. GEN: Well nourished, well developed, in no acute distress  HEENT: normal  Neck: no JVD, carotid bruits, or masses Cardiac: RRR; no murmurs, rubs, or gallops,no edema  Respiratory:  clear to auscultation bilaterally, normal work of breathing GI: soft, nontender, nondistended, + BS MS: no deformity or atrophy  Skin: warm and dry, no rash Neuro:  Strength and sensation are intact Psych:  euthymic mood, full affect Femoral: +2 bilaterally.  He has palpable posterior tibial pulse on the left and a palpable dorsalis pedis on the right.  EKG:  EKG is ordered today. EKG showed: Sinus bradycardia       Recent Labs: 05/11/2023: BUN 14; Creatinine, Ser 0.97; Potassium 5.2; Sodium 139 10/24/2023: Hemoglobin 12.7; Platelets 313    Lipid Panel    Component Value Date/Time   CHOL 104 03/04/2021 0535   CHOL 107 08/09/2020 1549   TRIG 87 03/04/2021 0535   HDL 36 (L) 03/04/2021 0535   HDL 33 (L) 08/09/2020 1549   CHOLHDL 2.9 03/04/2021 0535   VLDL 17 03/04/2021 0535   LDLCALC 51 03/04/2021 0535   LDLCALC 49 08/09/2020 1549   LDLDIRECT 80 11/06/2013 0925      Wt Readings from Last 3 Encounters:  12/21/23 216 lb 3.2 oz (98.1 kg)  10/24/23 212 lb (96.2 kg)  05/19/23 212 lb (96.2 kg)        ASSESSMENT AND PLAN:   1. Peripheral arterial disease: He is doing well overall with minimal calf claudication post reintervention on the right SFA last year.  He does have palpable dorsalis pedis pulse.  He is scheduled for follow-up ABI and duplex later this month.  Continue long-term dual antiplatelet therapy as tolerated.   2. Peripheral diabetic neuropathy: Currently on Lyrica  with improvement in symptoms.  3. Coronary artery disease involving native coronary arteries with stable angina: Chronic atypical chest pain.  His symptoms are overall stable.    4. Essential hypertension: Blood pressure is controlled on current medications.  5. Hyperlipidemia: Most recent lipid profile from 2022 showed  an LDL of 51.  His LDL is at target on atorvastatin  and ezetimibe .     Disposition: Follow-up in 6 months.  Signed,  Antionette Kirks, MD  03/06/2024 4:53 PM    [facility]

## 2024-03-07 ENCOUNTER — Ambulatory Visit (HOSPITAL_COMMUNITY): Admit: 2024-03-07 | Payer: No Typology Code available for payment source | Admitting: Urology

## 2024-03-07 ENCOUNTER — Ambulatory Visit: Admitting: Cardiovascular Disease

## 2024-03-07 SURGERY — INSERTION, PENILE PROSTHESIS
Anesthesia: General

## 2024-03-09 ENCOUNTER — Other Ambulatory Visit (HOSPITAL_COMMUNITY): Payer: Self-pay

## 2024-03-21 ENCOUNTER — Encounter: Payer: Self-pay | Admitting: Cardiovascular Disease

## 2024-03-21 ENCOUNTER — Ambulatory Visit: Attending: Cardiovascular Disease | Admitting: Cardiovascular Disease

## 2024-03-21 VITALS — BP 122/62 | HR 64 | Ht 69.0 in | Wt 219.0 lb

## 2024-03-21 DIAGNOSIS — E785 Hyperlipidemia, unspecified: Secondary | ICD-10-CM | POA: Diagnosis not present

## 2024-03-21 DIAGNOSIS — I2510 Atherosclerotic heart disease of native coronary artery without angina pectoris: Secondary | ICD-10-CM | POA: Diagnosis not present

## 2024-03-21 DIAGNOSIS — I739 Peripheral vascular disease, unspecified: Secondary | ICD-10-CM

## 2024-03-21 DIAGNOSIS — I10 Essential (primary) hypertension: Secondary | ICD-10-CM | POA: Diagnosis not present

## 2024-03-21 NOTE — Patient Instructions (Signed)
 Medication Instructions:  Your physician recommends that you continue on your current medications as directed. Please refer to the Current Medication list given to you today.  *If you need a refill on your cardiac medications before your next appointment, please call your pharmacy*  Lab Work: NONE ordered at this time of appointment   Testing/Procedures: NONE ordered at this time of appointment   Follow-Up: At [facility], you and your health needs are our priority.  As part of our continuing mission to provide you with exceptional heart care, our providers are all part of one team.  This team includes your primary Cardiologist (physician) and Advanced Practice Providers or APPs (Physician Assistants and Nurse Practitioners) who all work together to provide you with the care you need, when you need it.  Your next appointment:   6 month(s)  Provider:   Dr. Alvenia Aus    We recommend signing up for the patient portal called "MyChart".  Sign up information is provided on this After Visit Summary.  MyChart is used to connect with patients for Virtual Visits (Telemedicine).  Patients are able to view lab/test results, encounter notes, upcoming appointments, etc.  Non-urgent messages can be sent to your provider as well.   To learn more about what you can do with MyChart, go to [email or web address].

## 2024-03-21 NOTE — Progress Notes (Signed)
 Cardiology Office Note   Date:  03/21/2024   ID:  Steven Orion., DOB July 24, 1956, MRN [id number]  PCP:  Carolyn Cisco, NP  Cardiologist:  Dr. Ilean Mall chief complaint on file.     History of Present Illness: Steven Beerman. is a 68 y.o. male who presents for a followup visit regarding peripheral arterial disease and coronary artery disease. He has known history of coronary artery disease with multiple interventions in the past. He had CABG in 2011.  He quit smoking in 2009.  He had pituitary adenoma resection. He has known history of peripheral arterial disease with intervention on both SFAs. Cardiac catheterization in January, 2017 showed mild nonobstructive LAD disease, occluded mid left circumflex and occluded proximal right coronary artery with left-to-right collaterals. SVG to RCA was occluded and LIMA to LAD was atretic. SVG to OM was normal. Abnormal stress test was due to chronically occluded right coronary artery and graft with left-to-right collaterals. His native RCA was not favorable for CTO PCI. I recommended medical therapy.  He had recurrent right leg claudication in 2022.  Angiography in April of 2022  showed no significant aortoiliac disease.  On the right side, there was severe calcified stenosis in the common femoral artery with patent SFA stent with moderate in-stent restenosis and two-vessel runoff below the knee.  On the left side, there was mild common femoral artery disease, patent SFA stent with minimal restenosis and two-vessel runoff below the knee. The patient underwent right common femoral artery endarterectomy by Dr. Edgardo Goodwill in April 2022.  He had lobectomy done in March of 2023 by Dr. Deloise Ferries and was diagnosed with carcinoid tumor.    He had recurrent right calf claudication last year.  I proceeded with angiography in July which showed significant stenosis in the ostial right SFA with occluded mid to distal stent, moderate popliteal artery  stenosis and three-vessel runoff below the knee.  I performed successful drug-coated balloon angioplasty of the right mid to distal SFA stent and drug-coated balloon angioplasty of the ostial SFA.  Most recent vascular testing last month showed an ABI of 0.94 on the right and 1.0 on the left.  Duplex showed patent SFA stents.  He had recent cramps in both legs and was concerned about circulation.  He is trying to do more water aerobics and has no consistent calf claudication.  Past Medical History:  Diagnosis Date   Anemia, unspecified    Bell's palsy    resolved - no deficits   Blood transfusion    during treatment for Ca   Blood transfusion without reported diagnosis    CAD (coronary artery disease)    a. s/p multiple PCIs;  b. s/p CABG in 09/2010 (LIMA-LAD, SVG-OM1, SVG-distal RCA/OM2);  Cardiac cath in 12/2015: Mild LAD disease, occluded mid LCX and proximal RCA (left to right collaterals), Occluded SVG to RCA and atretic LIMA. Patent SVG to OM   Chronic back pain    Diabetes mellitus without complication (HCC)    type 2   DJD (degenerative joint disease)    low back & all over    GERD (gastroesophageal reflux disease)    Helicobacter pylori (H. pylori) infection    Hemorrhoids    History of shingles    Hyperlipidemia    Hypertension    Impotence of organic origin    Myocardial infarction (HCC)    2005 and 2012    PAD (peripheral artery disease) (HCC)    a. s/p bilat  SFA stents;  b. ABIs 11/2012: R 0.99, L 0.86.   Pancreatic cancer [facility])    surgery 2009 cyst removed   Pituitary adenoma [facility])    surgery at [facility] Feb 2020   Sleep apnea 06/05/2019   does not use cpap   Substance abuse (HCC)    drug and alcohol  abuse    Past Surgical History:  Procedure Laterality Date   ABDOMINAL AORTAGRAM N/A 09/13/2013   Procedure: ABDOMINAL Tommi Fraise;  Surgeon: Wenona Hamilton, MD;  [facility]: [facility] CATH [facility];  Service: Cardiovascular;  Laterality: N/A;   ABDOMINAL AORTAGRAM N/A 08/29/2014    Procedure: ABDOMINAL Tommi Fraise;  Surgeon: Wenona Hamilton, MD;  [facility]: [facility];  Service: Cardiovascular;  Laterality: N/A;   ABDOMINAL AORTOGRAM W/LOWER EXTREMITY N/A 02/19/2021   Procedure: ABDOMINAL AORTOGRAM W/LOWER EXTREMITY;  Surgeon: Wenona Hamilton, MD;  [facility]: [facility] INVASIVE CV LAB;  Service: Cardiovascular;  Laterality: N/A;   ABDOMINAL AORTOGRAM W/LOWER EXTREMITY N/A 05/19/2023   Procedure: ABDOMINAL AORTOGRAM W/LOWER EXTREMITY;  Surgeon: Wenona Hamilton, MD;  [facility]: [facility];  Service: Cardiovascular;  Laterality: N/A;   BACK SURGERY     1992   BRONCHIAL BIOPSY  01/13/2022   Procedure: BRONCHIAL BIOPSIES;  Surgeon: Prudy Brownie, DO;  Location: [facility] ENDOS[facility];  Service: Pulmonary;;   BRONCHIAL BRUSHINGS  01/13/2022   Procedure: BRONCHIAL BRUSHINGS;  Surgeon: Prudy Brownie, DO;  [facility];  Service: Pulmonary;;   BRONCHIAL NEEDLE ASPIRATION BIOPSY  01/13/2022   Procedure: BRONCHIAL NEEDLE ASPIRATION BIOPSIES;  Surgeon: Prudy Brownie, DO;  [facility]: [facility] ENDOS[facility];  Service: Pulmonary;;   CARDIAC CATHETERIZATION N/A 12/25/2015   Procedure: Left Heart Cath and Cors/Grafts Angiography;  Surgeon: Wenona Hamilton, MD;  [facility];  Service: Cardiovascular;  Laterality: N/A;   COLONOSCOPY     CORONARY ARTERY BYPASS GRAFT  nov 2011   x 4   ENDARTERECTOMY FEMORAL Right 03/03/2021   Procedure: RIGHT FEMORAL ENDARTERECTOMY;  Surgeon: Carlene Che, MD;  Location: [facility] OR;  Service: Vascular;  Laterality: Right;   ESOPHAGOGASTRODUODENOSCOPY (EGD) WITH PROPOFOL  N/A 04/02/2017   Procedure: ESOPHAGOGASTRODUODENOSCOPY (EGD) WITH PROPOFOL ;  Surgeon: Alvis Jourdain, MD;  Location: [facility] ENDOS[facility];  Service: Endoscopy;  Laterality: N/A;   HEMORRHOID SURGERY  10/30/2011   Procedure: HEMORRHOIDECTOMY;  Surgeon: Rogena Class, MD;  Location: [facility] OR;  Service: General;  Laterality: N/A;   JOINT REPLACEMENT     pancreatic  cancer  05/10/2008    Resection of distal panrease and spleen   PARTIAL KNEE ARTHROPLASTY Right 11/19/2014   Procedure: RIGHT UNI-KNEE ARTHROPLASTY MEDIALLY;  Surgeon: Bevin Bucks, MD;  Location: [facility] ORS;  Service: Orthopedics;  Laterality: Right;   PATCH ANGIOPLASTY Right 03/03/2021   Procedure: PATCH ANGIOPLASTY;  Surgeon: Carlene Che, MD;  Location: [facility] OR;  Service: Vascular;  Laterality: Right;   PERIPHERAL VASCULAR BALLOON ANGIOPLASTY  05/19/2023   Procedure: PERIPHERAL VASCULAR BALLOON ANGIOPLASTY;  Surgeon: Wenona Hamilton, MD;  [facility];  Service: Cardiovascular;;  dcb   PERIPHERAL VASCULAR CATHETERIZATION  Oct 2014   Lt SFA PTA   PERIPHERAL VASCULAR CATHETERIZATION  Oct 2015   ISR Lt SFA-PTA   right partial knee replacement     splenectomy     THROMBECTOMY FEMORAL ARTERY Left 09/13/2013   Procedure: THROMBECTOMY FEMORAL ARTERY;  Surgeon: Wenona Hamilton, MD;  [facility];  Service: Cardiovascular;  Laterality: Left;   TRANSPHENOIDAL / TRANSNASAL HYPOPHYSECTOMY / RESECTION PITUITARY  TUMOR  12/2018   [facility]   VIDEO BRONCHOSCOPY N/A 02/09/2022   Procedure: VIDEO BRONCHOSCOPY;  Surgeon: Hilarie Lovely, MD;  Location: [facility] OR;  Service: Thoracic;  Laterality: N/A;   VIDEO BRONCHOSCOPY WITH RADIAL ENDOBRONCHIAL ULTRASOUND  01/13/2022   Procedure: RADIAL ENDOBRONCHIAL ULTRASOUND;  Surgeon: Prudy Brownie, DO;  [facility]: [facility];  Service: Pulmonary;;     Current Outpatient Medications  Medication Sig Dispense Refill   acetaminophen  (TYLENOL ) 500 MG tablet Take two tablets (1,000 mg dose) by mouth every 6 (six) hours as needed for Pain or Fever for up to 7 days. 35 tablet 0   aspirin  EC (ASPIRIN  LOW DOSE) 81 MG tablet Take 1 tablet (81 mg total) by mouth daily. 90 tablet 3   atorvastatin  (LIPITOR) 40 MG tablet Take 1 tablet (40 mg total) by mouth every evening. 90 tablet 3   clopidogrel  (PLAVIX ) 75 MG tablet Take 1 tablet (75 mg total) by mouth in the morning. 90  tablet 3   diclofenac  (VOLTAREN ) 75 MG EC tablet Take 1 tablet (75 mg total) by mouth 2 (two) times daily with meals 60 tablet 2   DULoxetine  (CYMBALTA ) 30 MG capsule Take 1 capsule (30 mg total) by mouth 2 (two) times daily. 270 capsule 3   empagliflozin  (JARDIANCE ) 25 MG TABS tablet Take 1 tablet (25 mg total) by mouth in the morning. 90 tablet 3   ezetimibe  (ZETIA ) 10 MG tablet Take 1 tablet (10 mg total) by mouth in the morning. 90 tablet 3   fish oil-omega-3 fatty acids  1000 MG capsule Take 1 capsule (1 g total) by mouth daily. 60 capsule 1   ibuprofen  (ADVIL ) 800 MG tablet Take one tablet (800 mg dose) by mouth every 6 (six) hours as needed for Pain or Fever for up to 7 days. Take with food to avoid GI upset 28 tablet 0   levothyroxine  (SYNTHROID ) 25 MCG tablet Take 1 tablet (25 mcg total) by mouth in the morning before any other medications or food. 90 tablet 3   lisinopril  (ZESTRIL ) 2.5 MG tablet Take 1 tablet (2.5 mg total) by mouth in the morning. 90 tablet 3   meloxicam  (MOBIC ) 15 MG tablet Take 1 tablet (15 mg total) by mouth in the morning. 90 tablet 3   metFORMIN  (GLUCOPHAGE -XR) 750 MG 24 hr tablet Take 2 tablets (1,500 mg total) by mouth daily in the morning with food. 180 tablet 3   metoprolol  tartrate (LOPRESSOR ) 25 MG tablet TAKE 1 TABLET BY MOUTH TWICE  DAILY 200 tablet 2   moxifloxacin  (VIGAMOX ) 0.5 % ophthalmic solution Place 1 drop into the left eye 4 (four) times daily. 3 mL 0   Multiple Vitamin (MULTIVITAMIN) tablet Take 1 tablet by mouth daily. 30 tablet 10   nitroGLYCERIN  (NITROSTAT ) 0.4 MG SL tablet Place 0.4 mg under the tongue every 2 (two) hours as needed for chest pain.     pregabalin  (LYRICA ) 300 MG capsule Take 1 capsule (300 mg total) by mouth 2 (two) times daily. 180 capsule 1   Propylene Glycol (SYSTANE BALANCE) 0.6 % SOLN Use as directed everyday 60 mL 3   Semaglutide ,0.25 or 0.5MG /DOS, (OZEMPIC , 0.25 OR 0.5 MG/DOSE,) 2 MG/3ML SOPN Inject 0.25 mg into the skin  once a week for 4 weeks then increase to 0.5 mg weekly 9 mL 3   tadalafil  (CIALIS ) 5 MG tablet Take 1 tablet (5 mg total) by mouth in the morning. 90 tablet 3   ondansetron  (ZOFRAN -ODT) 4 MG disintegrating tablet  Take one tablet (4 mg dose) by mouth every 8 (eight) hours as needed for Nausea for up to 7 days. (Patient not taking: Reported on 03/21/2024) 12 tablet 0   No current facility-administered medications for this visit.    Allergies:   Patient has no known allergies.    Social History:  The patient  reports that he quit smoking about 16 years ago. His smoking use included cigarettes. He started smoking about 34 years ago. He has a 18 pack-year smoking history. He has never used smokeless tobacco. He reports that he does not currently use alcohol . He reports that he does not currently use drugs.   Family History:  The patient's family history includes Cancer in his father and sister; Heart attack in his father.    ROS:  Please see the history of present illness.   Otherwise, review of systems are positive for none.   All other systems are reviewed and negative.    PHYSICAL EXAM: VS:  BP 122/62 (BP Location: Left Arm, Patient Position: Sitting, Cuff Size: Normal)   Pulse 64   Ht 5\' 9"  (1.753 m)   Wt 219 lb (99.3 kg)   SpO2 96%   BMI 32.34 kg/m  , BMI Body mass index is 32.34 kg/m. GEN: Well nourished, well developed, in no acute distress  HEENT: normal  Neck: no JVD, carotid bruits, or masses Cardiac: RRR; no murmurs, rubs, or gallops,no edema  Respiratory:  clear to auscultation bilaterally, normal work of breathing GI: soft, nontender, nondistended, + BS MS: no deformity or atrophy  Skin: warm and dry, no rash Neuro:  Strength and sensation are intact Psych: euthymic mood, full affect Femoral: +2 bilaterally.  He has palpable posterior tibial pulse on the left and a palpable dorsalis pedis on the right.  There is a faint dorsalis pedis on the left as well.  EKG:  EKG is  not ordered today.       Recent Labs: 05/11/2023: BUN 14; Creatinine, Ser 0.97; Potassium 5.2; Sodium 139 10/24/2023: Hemoglobin 12.7; Platelets 313    Lipid Panel    Component Value Date/Time   CHOL 104 03/04/2021 0535   CHOL 107 08/09/2020 1549   TRIG 87 03/04/2021 0535   HDL 36 (L) 03/04/2021 0535   HDL 33 (L) 08/09/2020 1549   CHOLHDL 2.9 03/04/2021 0535   VLDL 17 03/04/2021 0535   LDLCALC 51 03/04/2021 0535   LDLCALC 49 08/09/2020 1549   LDLDIRECT 80 11/06/2013 0925      Wt Readings from Last 3 Encounters:  03/21/24 219 lb (99.3 kg)  12/21/23 216 lb 3.2 oz (98.1 kg)  10/24/23 212 lb (96.2 kg)        ASSESSMENT AND PLAN:   1. Peripheral arterial disease: He had intermittent cramps in both legs which I do not think is related to peripheral arterial disease.  No symptoms suggestive of claudication and he has no critical limb ischemia.  Doppler studies last month showed near normal ABI with patent right SFA post intervention.  In addition, he has palpable pulses today.  I reassured him.  Continue long-term dual antiplatelet therapy as tolerated.  I encouraged him to increase his exercise.    2. Peripheral diabetic neuropathy: I suspect that some of his symptoms are related to this.  He is on Lyrica .  3. Coronary artery disease involving native coronary arteries with stable angina: Chronic atypical chest pain.  His symptoms are overall stable.    4. Essential hypertension: Blood pressure is  controlled on current medications.  5. Hyperlipidemia: Most recent lipid profile from 2022 showed an LDL of 51.  His LDL is at target on atorvastatin  and ezetimibe .  He gets routine labs done at the [place].   Disposition: Follow-up in 6 months.  Signed,  Antionette Kirks, MD  03/21/2024 9:09 AM    [facility]

## 2024-03-25 ENCOUNTER — Other Ambulatory Visit (HOSPITAL_COMMUNITY): Payer: Self-pay

## 2024-03-26 ENCOUNTER — Other Ambulatory Visit (HOSPITAL_COMMUNITY): Payer: Self-pay

## 2024-03-27 ENCOUNTER — Other Ambulatory Visit: Payer: Self-pay

## 2024-03-27 ENCOUNTER — Other Ambulatory Visit (HOSPITAL_COMMUNITY): Payer: Self-pay

## 2024-03-27 MED ORDER — DICLOFENAC SODIUM 75 MG PO TBEC
75.0000 mg | DELAYED_RELEASE_TABLET | Freq: Two times a day (BID) | ORAL | 2 refills | Status: DC
  Filled 2024-03-27: qty 60, 30d supply, fill #0
  Filled 2024-04-08 – 2024-05-01 (×3): qty 60, 30d supply, fill #1
  Filled 2024-06-23: qty 60, 30d supply, fill #2

## 2024-03-28 ENCOUNTER — Other Ambulatory Visit (HOSPITAL_COMMUNITY): Payer: Self-pay

## 2024-03-29 ENCOUNTER — Other Ambulatory Visit (HOSPITAL_COMMUNITY): Payer: Self-pay

## 2024-04-07 ENCOUNTER — Other Ambulatory Visit (HOSPITAL_COMMUNITY): Payer: Self-pay

## 2024-04-08 ENCOUNTER — Other Ambulatory Visit (HOSPITAL_COMMUNITY): Payer: Self-pay

## 2024-04-09 ENCOUNTER — Other Ambulatory Visit: Payer: Self-pay

## 2024-04-15 ENCOUNTER — Other Ambulatory Visit (HOSPITAL_COMMUNITY): Payer: Self-pay

## 2024-04-17 ENCOUNTER — Other Ambulatory Visit (HOSPITAL_COMMUNITY): Payer: Self-pay

## 2024-05-01 ENCOUNTER — Other Ambulatory Visit (HOSPITAL_COMMUNITY): Payer: Self-pay

## 2024-05-01 ENCOUNTER — Other Ambulatory Visit: Payer: Self-pay

## 2024-05-01 MED ORDER — LISINOPRIL 2.5 MG PO TABS
2.5000 mg | ORAL_TABLET | Freq: Every morning | ORAL | 3 refills | Status: AC
  Filled 2024-05-01 – 2024-12-07 (×2): qty 90, 90d supply, fill #0

## 2024-05-02 ENCOUNTER — Other Ambulatory Visit: Payer: Self-pay

## 2024-05-02 ENCOUNTER — Other Ambulatory Visit (HOSPITAL_COMMUNITY): Payer: Self-pay

## 2024-05-05 ENCOUNTER — Other Ambulatory Visit (HOSPITAL_COMMUNITY): Payer: Self-pay

## 2024-05-28 ENCOUNTER — Other Ambulatory Visit: Payer: Self-pay | Admitting: Cardiovascular Disease

## 2024-05-29 ENCOUNTER — Other Ambulatory Visit (HOSPITAL_COMMUNITY): Payer: Self-pay

## 2024-05-30 ENCOUNTER — Other Ambulatory Visit (HOSPITAL_COMMUNITY): Payer: Self-pay

## 2024-05-30 ENCOUNTER — Other Ambulatory Visit: Payer: Self-pay

## 2024-05-30 NOTE — ED Provider Notes (Signed)
[facility]  ED Provider Note  Steven Hale. 68 y.o. male DOB: 18-Sep-1956 MRN: [id number] History   Chief Complaint  Patient presents with  . Flank Pain    Sent from [place] for C/o right flank pain and hypotension  x 1 day   . Hypotension   Patient presents to the emergency room from the [place] where patient went complaining of right flank pain patient was sent here for further evaluation  Patient's knowledge of his medical issues is limited Records suggest that he has diabetes and hypertension  Medicines unknown  Allergies, none  Smoke none alcohol  none drugs none       Past Medical History:  Diagnosis Date  . Asthma (*)   . Diabetes mellitus (*)   . Hypertension     History reviewed. No pertinent surgical history.  Social History   Substance and Sexual Activity  Alcohol  Use None   Tobacco Use History[1] E-Cigarettes  . Vaping Use    . Start Date    . Cartridges/Day    . Quit Date     Social History   Substance and Sexual Activity  Drug Use Not on file         Allergies[2]  Home Medications   FAMOTIDINE  (PEPCID ) 20 MG TABLET    Take one tablet (20 mg dose) by mouth 2 (two) times daily for 7 days.   IBUPROFEN  (ADVIL ,MOTRIN ) 800 MG TABLET    Take one tablet (800 mg dose) by mouth every 6 (six) hours as needed for Pain or Fever for up to 7 days. Take with food to avoid GI upset    Primary Survey  Primary Survey  Review of Systems   Review of Systems  Cardiovascular:  Negative for chest pain.  Gastrointestinal:  Negative for abdominal pain, nausea and vomiting.  Genitourinary:  Positive for flank pain.  All other systems reviewed and are negative.   Physical Exam   ED Triage Vitals  BP 05/30/24 1605 (!) 80/40  Heart Rate 05/30/24 1604 64  Resp 05/30/24 1604 16  SpO2 05/30/24 1604 97 %  Temp 05/30/24 1604 98.4 F (36.9 C)    Physical Exam  Nursing note and vitals reviewed. Constitutional: He appears  well-developed. He does not appear distressed.  HENT:  Head: Normocephalic and atraumatic.  Eyes: Pupils are equal, round, and reactive to light.  Neck: Normal range of motion.  Cardiovascular: Normal rate and regular rhythm.  Pulmonary/Chest: Respiratory effort normal.  Abdominal: Soft.  Musculoskeletal: Normal range of motion.     Cervical back: Normal range of motion.   Neurological: He is alert.  Skin: Skin is warm.  Psychiatric: He has a normal mood and affect.     ED Course   Lab results:   CBC AND DIFFERENTIAL - Abnormal      Result Value   WBC 4.7     RBC 3.87 (*)    HGB 11.4 (*)    HCT 36.0 (*)    MCV 93.0 (*)    MCH 29.5     MCHC 31.7 (*)    Plt Ct 317     RDW SD 54.6 (*)    MPV 11.7     NRBC% 0.0     Absolute NRBC Count 0.00     NEUTROPHIL % 36.1     LYMPHOCYTE % 43.6     MONOCYTE % 13.1     Eosinophil % 5.9     BASOPHIL % 1.1  IG% 0.2     ABSOLUTE NEUTROPHIL COUNT 1.71     ABSOLUTE LYMPHOCYTE COUNT 2.06     Absolute Monocyte Count 0.62     Absolute Eosinophil Count 0.28     Absolute Basophil Count 0.05     Absolute Immature Granulocyte Count 0.01    COMPREHENSIVE METABOLIC PANEL - Abnormal   Na 137     Potassium 5.0     Cl 100     CO2 23     AGAP 14     Glucose 139 (*)    BUN 18     Creatinine 1.29 (*)    Ca 9.4     ALK PHOS 63     T Bili 0.3     Total Protein 7.1     Alb 4.5     GLOBULIN 2.6     ALBUMIN /GLOBULIN RATIO 1.7     BUN/CREAT RATIO 14.0     ALT 13     AST 24     eGFR 60     Comment: Normal GFR (glomerular filtration rate) > 60 mL/min/1.73 meters squared, < 60 may include impaired kidney function. Calculation based on the Chronic Kidney Disease Epidemiology Collaboration (CK-EPI)equation refit without adjustment for race.  URINALYSIS ONLY - NO SYMPTOMS - Abnormal   Urine Color Yellow     Urine Clarity Clear     Urine Specific Gravity 1.025     Comment: Specific gravity not compensated for elevated protein and/or glucose  due to over-range (>) results.   Urine pH 5.5     Urine Protein - Dipstick Negative     Urine Glucose >1000 (*)    Urine Ketones Negative     Urine Bilirubin Negative     Urine Blood Negative     Urine Nitrite Negative     Urine Urobilinogen <2     Urine Leukocyte Esterase Negative     UA Microscopic No Micro    LIPASE - Normal   Lipase 20    MAGNESIUM  - Normal   Mg 2.3    ETHANOL - Normal   Ethanol <10     Comment: Blood Alcohol  Level is for Medical Purposes Only.    Imaging:   CT ABDOMEN PELVIS WO IV CONTRAST (STONE PROTOCOL)   Narrative:    CT ABDOMEN AND PELVIS WITHOUT INTRAVENOUS CONTRAST:  HISTORY: 68 years  old Male with Flank Pain.  COMPARISON: None available  TECHNIQUE: Axial contiguous images were obtained from the lung bases to the proximal femurs without intravenous contrast administered. Sagittal and coronal reconstructions were also obtained and reviewed. This exam was performed according to our departmental dose-optimization program, which includes automated exposure control, adjustment of the mA and/or KV according to the patient's size and/or use of iterative reconstruction technique.  FINDINGS:  Evaluation of the upper abdomen is limited by the lack of intravenous contrast.  LUNG BASES: No focal consolidative airspace opacities are seen.  No discrete pleural effusions are seen.  Coronary artery calcifications are seen. The cardiac silhouette is prominent in size. There are midline sternotomy changes present. There is atherosclerotic calcification at the ascending aorta. There are chain sutures at the right infrahilar region.  LIVER: The visualized hepatic parenchyma demonstrates no focal abnormality.  GALLBLADDER: The gallbladder is moderately distended with cholelithiasis present.   SPLEEN: The spleen is normal in size.  PANCREAS: The pancreas is unremarkable.  ADRENAL GLANDS: The bilateral adrenal glands are unremarkable.  KIDNEYS: Both kidneys  demonstrate no hydronephrosis. No renal  or ureteral calculi are seen.  PELVIS: The urinary bladder is mildly distended.  STOMACH: The stomach is moderately distended.  BOWEL: The small bowel loops appear unremarkable. No pericolonic inflammatory stranding is seen. There is a moderate amount of stool seen throughout the colon. There are multiple colonic diverticula without evidence to suggest acute diverticulitis.  APPENDIX: The appendix is unremarkable.  PERITONEUM: There is no evidence of pneumoperitoneum or free fluid.  VESSELS: The IVC is unremarkable. The abdominal aorta is within normal limits of size. There is moderate atherosclerotic calcification at aorta and iliac vasculature.  LYMPH NODES: No significantly enlarged lymph nodes are seen in the abdomen or pelvis.  BONES AND SOFT TISSUES: No acute osseous abnormality is seen. Multilevel degenerative changes are seen at the lumbar spine. There is a generator pack over the left flank region with the lead tips extending towards the thoracic spine.    Impression:    IMPRESSION: No acute process is seen within the abdomen or pelvis.     Electronically Signed by: Rock Croft, MD on 05/30/2024 6:46 PM  XR CHEST AP PORTABLE   Narrative:    HISTORY:  Weakness  TECHNIQUE: Single view chest  COMPARISON: None  FINDINGS:  -Heart size is borderline. CABG..  -No consolidation. minor scarring in the lung bases. -No acute osseous findings.       Impression:    IMPRESSION: No acute radiographic findings.  Electronically Signed by: Venetia Smalling, MD on 05/30/2024 5:37 PM      ECG: ECG Results   None                                                                     Pre-Sedation Procedures    Medical Decision Making Patient presents here because of flank pain back pain upon presentation patient was found to be mildly hypotensive given IV fluids patient is on metoprolol  and lisinopril   pressure we will have him stop the lisinopril \patient's laboratory studies and workup were unremarkable Patient states that he is already been taking tizanidine and Lidoderm  patch for his back pain We will add tramadol     Amount and/or Complexity of Data Reviewed Labs: ordered. Radiology: ordered.            Provider Communication  New Prescriptions   TRAMADOL  (ULTRAM ) 50 MG TABLET    Take one tablet (50 mg dose) by mouth every 6 (six) hours as needed for Pain for up to 3 days. Max Daily Amount: 200 mg      Quantity: 10 tablet    Refills: 0    Modified Medications   No medications on file    Discontinued Medications   No medications on file    Clinical Impression Final diagnoses:  Strain of lumbar paraspinous muscle, initial encounter  Hypotension due to drugs    ED Disposition     ED Disposition  Discharge   Condition  Stable   Comment  --                   Electronically signed by:       [1] Social History Tobacco Use  Smoking Status Not on file  Smokeless Tobacco Not on file  [2] No Known Allergies  Steven JAYSON Life, MD 05/30/24  1906  

## 2024-05-31 ENCOUNTER — Other Ambulatory Visit (HOSPITAL_COMMUNITY): Payer: Self-pay

## 2024-06-02 ENCOUNTER — Encounter (HOSPITAL_COMMUNITY): Payer: Self-pay

## 2024-06-02 ENCOUNTER — Emergency Department (HOSPITAL_COMMUNITY)
Admission: EM | Admit: 2024-06-02 | Discharge: 2024-06-02 | Disposition: A | Discharge status: Home / Self Care | Attending: Emergency Medicine | Admitting: Emergency Medicine

## 2024-06-02 ENCOUNTER — Other Ambulatory Visit: Payer: Self-pay

## 2024-06-02 ENCOUNTER — Other Ambulatory Visit (HOSPITAL_COMMUNITY): Payer: Self-pay

## 2024-06-02 DIAGNOSIS — G8929 Other chronic pain: Secondary | ICD-10-CM | POA: Insufficient documentation

## 2024-06-02 DIAGNOSIS — M545 Low back pain, unspecified: Secondary | ICD-10-CM | POA: Diagnosis present

## 2024-06-02 DIAGNOSIS — Z7982 Long term (current) use of aspirin: Secondary | ICD-10-CM | POA: Diagnosis not present

## 2024-06-02 DIAGNOSIS — Z79899 Other long term (current) drug therapy: Secondary | ICD-10-CM | POA: Diagnosis not present

## 2024-06-02 DIAGNOSIS — I10 Essential (primary) hypertension: Secondary | ICD-10-CM | POA: Diagnosis not present

## 2024-06-02 MED ORDER — PREGABALIN 300 MG PO CAPS
300.0000 mg | ORAL_CAPSULE | Freq: Two times a day (BID) | ORAL | 1 refills | Status: DC
  Filled 2024-08-07: qty 180, 90d supply, fill #0
  Filled 2024-09-21 – 2024-11-06 (×2): qty 180, 90d supply, fill #1

## 2024-06-02 NOTE — Discharge Instructions (Signed)
 As we discussed, please follow-up with your primary care for imaging.  Return to the ED if you have any further pain or worsening of your pain or symptoms.

## 2024-06-02 NOTE — ED Provider Notes (Signed)
 [place] EMERGENCY DEPARTMENT AT [facility] Provider Note   CSN: [id number] Arrival date & time: 06/02/24  9062     Patient presents with: Flank Pain   Tom Torris House. is a 68 y.o. male who presents to the ED today with right-sided lower back pain.  Seen at [facility] on 15 July for the same, extensive workup undertaken at that time with no acute findings noted.  He was diagnosed with a strain of the lumbar paraspinous muscle as well as was taken off of his ACE inhibitor due to hypotension on presentation.  Notable previous medical history is of peripheral artery disease, CABG, and essential hypertension.  He also has notable previous medical history of peripheral neuropathy and lumbar disc herniation with radiculopathy.  He denies having any lower extremity pain, does have a spinal cord stimulator in place for management of chronic back pain.  He denies any difficulty with ambulation, denies any bowel or bladder incontinence or retention.  Prior to coming in today he went to the [facility] where he is normally receiving his care, they referred him to the emergency department for emergent imaging, initially referred him to [facility] however he lives in [place] and decided to come to this hospital for workup as it is closer to his location.  He has been using tizanidine as well as Lidoderm  patches without any effect.    Flank Pain       Prior to Admission medications   Medication Sig Start Date End Date Taking? Authorizing Provider  acetaminophen  (TYLENOL ) 500 MG tablet Take two tablets (1,000 mg dose) by mouth every 6 (six) hours as needed for Pain or Fever for up to 7 days. 12/22/23     aspirin  EC (ASPIRIN  LOW DOSE) 81 MG tablet Take 1 tablet (81 mg total) by mouth daily. 09/28/23     atorvastatin  (LIPITOR) 40 MG tablet Take 1 tablet (40 mg total) by mouth every evening. 09/28/23     clopidogrel  (PLAVIX ) 75 MG tablet Take 1 tablet  (75 mg total) by mouth in the morning. 09/06/23     diclofenac  (VOLTAREN ) 75 MG EC tablet Take 1 tablet (75 mg total) by mouth 2 (two) times daily with meals. 03/27/24     DULoxetine  (CYMBALTA ) 30 MG capsule Take 1 capsule (30 mg total) by mouth 2 (two) times daily. 08/16/23     empagliflozin  (JARDIANCE ) 25 MG TABS tablet Take 1 tablet (25 mg total) by mouth in the morning. 09/28/23     ezetimibe  (ZETIA ) 10 MG tablet Take 1 tablet (10 mg total) by mouth in the morning. 09/28/23     fish oil-omega-3 fatty acids  1000 MG capsule Take 1 capsule (1 g total) by mouth daily. 11/30/12   Penne Noe, MD  ibuprofen  (ADVIL ) 800 MG tablet Take one tablet (800 mg dose) by mouth every 6 (six) hours as needed for Pain or Fever for up to 7 days. Take with food to avoid GI upset 12/22/23     levothyroxine  (SYNTHROID ) 25 MCG tablet Take 1 tablet (25 mcg total) by mouth in the morning before any other medications or food. 09/28/23     lisinopril  (ZESTRIL ) 2.5 MG tablet Take 1 tablet (2.5 mg total) by mouth in the morning. 05/01/24     meloxicam  (MOBIC ) 15 MG tablet Take 1 tablet (15 mg total) by mouth in the morning. 09/28/23     metFORMIN  (GLUCOPHAGE -XR) 750 MG 24 hr tablet Take 2 tablets (1,500  mg total) by mouth daily in the morning with food. 09/28/23     metoprolol  tartrate (LOPRESSOR ) 25 MG tablet TAKE 1 TABLET BY MOUTH TWICE  DAILY 02/07/24   Darron Deatrice LABOR, MD  moxifloxacin  (VIGAMOX ) 0.5 % ophthalmic solution Place 1 drop into the left eye 4 (four) times daily. 09/22/23     Multiple Vitamin (MULTIVITAMIN) tablet Take 1 tablet by mouth daily. 11/30/12   Penne Noe, MD  nitroGLYCERIN  (NITROSTAT ) 0.4 MG SL tablet Place 0.4 mg under the tongue every 2 (two) hours as needed for chest pain. 10/28/16   [provider]  ondansetron  (ZOFRAN -ODT) 4 MG disintegrating tablet Take one tablet (4 mg dose) by mouth every 8 (eight) hours as needed for Nausea for up to 7 days. Patient not taking: Reported on 03/21/2024 12/22/23      pregabalin  (LYRICA ) 300 MG capsule Take 1 capsule (300 mg total) by mouth 2 (two) times daily. 06/02/24     Propylene Glycol (SYSTANE BALANCE) 0.6 % SOLN Use as directed everyday 09/28/23     Semaglutide ,0.25 or 0.5MG /DOS, (OZEMPIC , 0.25 OR 0.5 MG/DOSE,) 2 MG/3ML SOPN Inject 0.25 mg into the skin once a week for 4 weeks then increase to 0.5 mg weekly 09/28/23     tadalafil  (CIALIS ) 5 MG tablet Take 1 tablet (5 mg total) by mouth in the morning. 01/31/24     sildenafil  (VIAGRA ) 100 MG tablet Take 1 tablet by mouth 1 hour before sexual activity. 09/28/23 03/06/24      Allergies: Patient has no known allergies.    Review of Systems  Musculoskeletal:  Positive for back pain.  All other systems reviewed and are negative.   Updated Vital Signs BP 101/68 (BP Location: Left Arm)   Pulse 62   Temp 98 F (36.7 C) (Oral)   Resp 16   SpO2 97%   Physical Exam Vitals and nursing note reviewed.  Constitutional:      General: He is not in acute distress.    Appearance: Normal appearance. He is well-developed.  HENT:     Head: Normocephalic and atraumatic.     Mouth/Throat:     Mouth: Mucous membranes are moist.     Pharynx: Oropharynx is clear.  Eyes:     Extraocular Movements: Extraocular movements intact.     Conjunctiva/sclera: Conjunctivae normal.     Pupils: Pupils are equal, round, and reactive to light.  Cardiovascular:     Rate and Rhythm: Normal rate and regular rhythm.     Pulses: Normal pulses.     Heart sounds: Normal heart sounds. No murmur heard.    No friction rub. No gallop.  Pulmonary:     Effort: Pulmonary effort is normal. No respiratory distress.     Breath sounds: Normal breath sounds.  Abdominal:     General: Abdomen is flat. Bowel sounds are normal.     Palpations: Abdomen is soft.     Tenderness: There is no abdominal tenderness. There is no right CVA tenderness or left CVA tenderness.  Musculoskeletal:        General: No swelling. Normal range of motion.      Cervical back: Normal, normal range of motion and neck supple.     Thoracic back: Normal.     Lumbar back: Tenderness present. No bony tenderness.     Right lower leg: No edema.     Left lower leg: No edema.     Comments: Right-sided paraspinal tenderness is elicited.  Skin:    General:  Skin is warm and dry.     Capillary Refill: Capillary refill takes less than 2 seconds.  Neurological:     General: No focal deficit present.     Mental Status: He is alert. Mental status is at baseline.  Psychiatric:        Mood and Affect: Mood normal.     (all labs ordered are listed, but only abnormal results are displayed) Labs Reviewed - No data to display  EKG: None  Radiology: No results found.   Procedures   Medications Ordered in the ED - No data to display                                  Medical Decision Making  Medical Decision Making:   Jamesmichael Shadd. is a 68 y.o. male who presented to the ED today with low back pain detailed above.    External chart has been reviewed including previous imaging and lab results from outside hospital. Complete initial physical exam performed, notably the patient  was alert and oriented in no apparent distress.  Notable exam finding was of tenderness to the right lumbar paraspinal muscles..    Reviewed and confirmed nursing documentation for past medical history, family history, social history.    Initial Assessment:   With the patient's presentation of right-sided lower back pain lower back pain, most likely diagnosis is muscular strain. Other diagnoses were considered including (but not limited to) paraspinal abscess. These are considered less likely due to history of present illness and physical exam findings.  No induration or fluctuance noted, previous imaging did not demonstrate any notable findings.   Initial Plan:  As patient had extensive workup 3 days prior, will defer further imaging and lab evaluation at this time. Objective  evaluation as below reviewed  .   Reassessment and Plan:   At reassessment of patient, patient stated that he had subjective improvement without any intervention.  Request discharge.  Discussed with patient to follow-up with primary care as scheduled, he understands agrees has no further concerns at this time.       Final diagnoses:  Chronic right-sided low back pain without sciatica    ED Discharge Orders     None          Myriam Dorn BROCKS, [place] 06/02/24 1545    Armenta Canning, MD 06/04/24 [phone]

## 2024-06-02 NOTE — ED Triage Notes (Signed)
 Pt arrived reporting R side flank pain since Sunday. Denies recent injury. Patient states has no urinary changes. Pain does not radiate. Endorses pain with moving

## 2024-06-23 ENCOUNTER — Other Ambulatory Visit (HOSPITAL_COMMUNITY): Payer: Self-pay

## 2024-06-23 MED ORDER — LISINOPRIL 2.5 MG PO TABS
2.5000 mg | ORAL_TABLET | Freq: Every morning | ORAL | 4 refills | Status: AC
  Filled 2024-06-23: qty 90, 90d supply, fill #0
  Filled 2024-11-06: qty 90, 90d supply, fill #1
  Filled 2024-12-07: qty 90, 90d supply, fill #2

## 2024-06-24 ENCOUNTER — Other Ambulatory Visit (HOSPITAL_COMMUNITY): Payer: Self-pay

## 2024-06-30 ENCOUNTER — Other Ambulatory Visit (HOSPITAL_COMMUNITY): Payer: Self-pay

## 2024-07-01 ENCOUNTER — Other Ambulatory Visit (HOSPITAL_COMMUNITY): Payer: Self-pay

## 2024-07-05 ENCOUNTER — Other Ambulatory Visit (HOSPITAL_COMMUNITY): Payer: Self-pay

## 2024-07-05 MED ORDER — TAMSULOSIN HCL 0.4 MG PO CAPS
0.4000 mg | ORAL_CAPSULE | Freq: Every evening | ORAL | 3 refills | Status: AC
  Filled 2024-07-05: qty 90, 90d supply, fill #0
  Filled 2024-12-07: qty 90, 90d supply, fill #1

## 2024-07-05 MED ORDER — ATORVASTATIN CALCIUM 40 MG PO TABS
40.0000 mg | ORAL_TABLET | Freq: Every evening | ORAL | 3 refills | Status: AC
  Filled 2024-07-05: qty 90, 90d supply, fill #0
  Filled 2024-11-06: qty 90, 90d supply, fill #1
  Filled 2024-12-07: qty 90, 90d supply, fill #2

## 2024-07-11 ENCOUNTER — Other Ambulatory Visit (HOSPITAL_COMMUNITY): Payer: Self-pay

## 2024-07-18 ENCOUNTER — Other Ambulatory Visit (HOSPITAL_COMMUNITY): Payer: Self-pay

## 2024-07-18 MED ORDER — METFORMIN HCL ER 750 MG PO TB24
1500.0000 mg | ORAL_TABLET | Freq: Every day | ORAL | 3 refills | Status: AC
  Filled 2024-07-18 – 2024-12-07 (×2): qty 180, 90d supply, fill #0

## 2024-07-24 ENCOUNTER — Other Ambulatory Visit (HOSPITAL_COMMUNITY): Payer: Self-pay

## 2024-07-24 MED ORDER — DICLOFENAC SODIUM 75 MG PO TBEC
75.0000 mg | DELAYED_RELEASE_TABLET | Freq: Two times a day (BID) | ORAL | 2 refills | Status: DC
  Filled 2024-07-24: qty 60, 30d supply, fill #0
  Filled 2024-08-18: qty 60, 30d supply, fill #1
  Filled 2024-08-28 – 2024-09-21 (×2): qty 60, 30d supply, fill #2

## 2024-08-01 ENCOUNTER — Other Ambulatory Visit (HOSPITAL_COMMUNITY): Payer: Self-pay

## 2024-08-01 MED ORDER — SILDENAFIL CITRATE 100 MG PO TABS
100.0000 mg | ORAL_TABLET | ORAL | 3 refills | Status: DC
  Filled 2024-08-01: qty 30, 30d supply, fill #0

## 2024-08-01 MED ORDER — DULOXETINE HCL 30 MG PO CPEP
30.0000 mg | ORAL_CAPSULE | Freq: Two times a day (BID) | ORAL | 3 refills | Status: AC
  Filled 2024-08-01: qty 180, 90d supply, fill #0
  Filled 2024-08-28 – 2024-11-06 (×2): qty 180, 90d supply, fill #1
  Filled 2024-12-07: qty 180, 90d supply, fill #2

## 2024-08-07 ENCOUNTER — Other Ambulatory Visit (HOSPITAL_COMMUNITY): Payer: Self-pay

## 2024-08-07 MED ORDER — TADALAFIL 5 MG PO TABS
5.0000 mg | ORAL_TABLET | Freq: Once | ORAL | 3 refills | Status: DC
  Filled 2024-08-07: qty 90, 90d supply, fill #0

## 2024-08-08 ENCOUNTER — Other Ambulatory Visit: Payer: Self-pay

## 2024-08-08 ENCOUNTER — Other Ambulatory Visit (HOSPITAL_COMMUNITY): Payer: Self-pay

## 2024-08-28 ENCOUNTER — Other Ambulatory Visit (HOSPITAL_COMMUNITY): Payer: Self-pay

## 2024-08-28 ENCOUNTER — Other Ambulatory Visit: Payer: Self-pay

## 2024-09-02 ENCOUNTER — Other Ambulatory Visit (HOSPITAL_COMMUNITY): Payer: Self-pay

## 2024-09-11 ENCOUNTER — Other Ambulatory Visit (HOSPITAL_COMMUNITY): Payer: Self-pay

## 2024-09-11 MED ORDER — AMOXICILLIN 875 MG PO TABS
875.0000 mg | ORAL_TABLET | Freq: Two times a day (BID) | ORAL | 0 refills | Status: AC
  Filled 2024-09-11: qty 14, 7d supply, fill #0

## 2024-09-13 ENCOUNTER — Other Ambulatory Visit (HOSPITAL_COMMUNITY): Payer: Self-pay

## 2024-09-13 MED ORDER — MECLIZINE HCL 25 MG PO TABS
25.0000 mg | ORAL_TABLET | Freq: Every day | ORAL | 1 refills | Status: AC
  Filled 2024-09-13: qty 14, 14d supply, fill #0

## 2024-09-19 ENCOUNTER — Ambulatory Visit: Attending: Cardiovascular Disease | Admitting: Cardiovascular Disease

## 2024-09-19 NOTE — Progress Notes (Deleted)
 Cardiology Office Note   Date:  09/19/2024   ID:  Steven Hale., DOB [birth year], MRN [id number]  PCP:  Delores Rojelio Caldron, NP  Cardiologist:  Dr. Ladena Johns chief complaint on file.     History of Present Illness: Steven Hale. is a 68 y.o. male who presents for a followup visit regarding peripheral arterial disease and coronary artery disease. He has known history of coronary artery disease with multiple interventions in the past. He had CABG in 2011.  He quit smoking in 2009.  He had pituitary adenoma resection. He has known history of peripheral arterial disease with intervention on both SFAs. Cardiac catheterization in January, 2017 showed mild nonobstructive LAD disease, occluded mid left circumflex and occluded proximal right coronary artery with left-to-right collaterals. SVG to RCA was occluded and LIMA to LAD was atretic. SVG to OM was normal. Abnormal stress test was due to chronically occluded right coronary artery and graft with left-to-right collaterals. His native RCA was not favorable for CTO PCI. I recommended medical therapy.  He had recurrent right leg claudication in 2022.  Angiography in April of 2022  showed no significant aortoiliac disease.  On the right side, there was severe calcified stenosis in the common femoral artery with patent SFA stent with moderate in-stent restenosis and two-vessel runoff below the knee.  On the left side, there was mild common femoral artery disease, patent SFA stent with minimal restenosis and two-vessel runoff below the knee. The patient underwent right common femoral artery endarterectomy by Dr. Magda in April 2022.  He had lobectomy done in March of 2023 by Dr. Shyrl and was diagnosed with carcinoid tumor.    He had recurrent right calf claudication last year.  I proceeded with angiography in July which showed significant stenosis in the ostial right SFA with occluded mid to distal stent, moderate popliteal  artery stenosis and three-vessel runoff below the knee.  I performed successful drug-coated balloon angioplasty of the right mid to distal SFA stent and drug-coated balloon angioplasty of the ostial SFA.  Most recent vascular testing last month showed an ABI of 0.94 on the right and 1.0 on the left.  Duplex showed patent SFA stents.  He had recent cramps in both legs and was concerned about circulation.  He is trying to do more water aerobics and has no consistent calf claudication.  Past Medical History:  Diagnosis Date   Anemia, unspecified    Bell's palsy    resolved - no deficits   Blood transfusion    during treatment for Ca   Blood transfusion without reported diagnosis    CAD (coronary artery disease)    a. s/p multiple PCIs;  b. s/p CABG in 09/2010 (LIMA-LAD, SVG-OM1, SVG-distal RCA/OM2);  Cardiac cath in 12/2015: Mild LAD disease, occluded mid LCX and proximal RCA (left to right collaterals), Occluded SVG to RCA and atretic LIMA. Patent SVG to OM   Chronic back pain    Diabetes mellitus without complication (HCC)    type 2   DJD (degenerative joint disease)    low back & all over    GERD (gastroesophageal reflux disease)    Helicobacter pylori (H. pylori) infection    Hemorrhoids    History of shingles    Hyperlipidemia    Hypertension    Impotence of organic origin    Myocardial infarction (HCC)    2005 and 2012    PAD (peripheral artery disease)    a. s/p bilat SFA  stents;  b. ABIs 11/2012: R 0.99, L 0.86.   Pancreatic cancer [facility])    surgery 2009 cyst removed   Pituitary adenoma [facility])    surgery at [facility] Feb 2020   Sleep apnea 06/05/2019   does not use cpap   Substance abuse (HCC)    drug and alcohol  abuse    Past Surgical History:  Procedure Laterality Date   ABDOMINAL AORTAGRAM N/A 09/13/2013   Procedure: ABDOMINAL EZELLA;  Surgeon: Deatrice DELENA Cage, MD;  Location: [facility] CATH LAB;  Service: Cardiovascular;  Laterality: N/A;   ABDOMINAL AORTAGRAM N/A 08/29/2014    Procedure: ABDOMINAL EZELLA;  Surgeon: Deatrice DELENA Cage, MD;  Location: [facility] CATH LAB;  Service: Cardiovascular;  Laterality: N/A;   ABDOMINAL AORTOGRAM W/LOWER EXTREMITY N/A 02/19/2021   Procedure: ABDOMINAL AORTOGRAM W/LOWER EXTREMITY;  Surgeon: Cage Deatrice DELENA, MD;  Location: [facility] INVASIVE CV LAB;  Service: Cardiovascular;  Laterality: N/A;   ABDOMINAL AORTOGRAM W/LOWER EXTREMITY N/A 05/19/2023   Procedure: ABDOMINAL AORTOGRAM W/LOWER EXTREMITY;  Surgeon: Cage Deatrice DELENA, MD;  Location: [facility] INVASIVE CV LAB;  Service: Cardiovascular;  Laterality: N/A;   BACK SURGERY     1992   BRONCHIAL BIOPSY  01/13/2022   Procedure: BRONCHIAL BIOPSIES;  Surgeon: Brenna Adine CROME, DO;  Location: [facility] ENDOSCOPY;  Service: Pulmonary;;   BRONCHIAL BRUSHINGS  01/13/2022   Procedure: BRONCHIAL BRUSHINGS;  Surgeon: Brenna Adine CROME, DO;  Location: [facility] ENDOSCOPY;  Service: Pulmonary;;   BRONCHIAL NEEDLE ASPIRATION BIOPSY  01/13/2022   Procedure: BRONCHIAL NEEDLE ASPIRATION BIOPSIES;  Surgeon: Brenna Adine CROME, DO;  Location: [facility] ENDOSCOPY;  Service: Pulmonary;;   CARDIAC CATHETERIZATION N/A 12/25/2015   Procedure: Left Heart Cath and Cors/Grafts Angiography;  Surgeon: Deatrice DELENA Cage, MD;  Location: [facility] INVASIVE CV LAB;  Service: Cardiovascular;  Laterality: N/A;   COLONOSCOPY     CORONARY ARTERY BYPASS GRAFT  nov 2011   x 4   ENDARTERECTOMY FEMORAL Right 03/03/2021   Procedure: RIGHT FEMORAL ENDARTERECTOMY;  Surgeon: Magda Steven SAILOR, MD;  Location: [facility] OR;  Service: Vascular;  Laterality: Right;   ESOPHAGOGASTRODUODENOSCOPY (EGD) WITH PROPOFOL  N/A 04/02/2017   Procedure: ESOPHAGOGASTRODUODENOSCOPY (EGD) WITH PROPOFOL ;  Surgeon: Rollin Dover, MD;  Location: [facility] ENDOSCOPY;  Service: Endoscopy;  Laterality: N/A;   HEMORRHOID SURGERY  10/30/2011   Procedure: HEMORRHOIDECTOMY;  Surgeon: Vicenta DELENA Poli, MD;  Location: [facility] OR;  Service: General;  Laterality: N/A;   JOINT REPLACEMENT     pancreatic  cancer  05/10/2008    Resection of distal panrease and spleen   PARTIAL KNEE ARTHROPLASTY Right 11/19/2014   Procedure: RIGHT UNI-KNEE ARTHROPLASTY MEDIALLY;  Surgeon: Donnice JONETTA Car, MD;  Location: [facility] ORS;  Service: Orthopedics;  Laterality: Right;   PATCH ANGIOPLASTY Right 03/03/2021   Procedure: PATCH ANGIOPLASTY;  Surgeon: Magda Steven SAILOR, MD;  Location: [facility] OR;  Service: Vascular;  Laterality: Right;   PERIPHERAL VASCULAR BALLOON ANGIOPLASTY  05/19/2023   Procedure: PERIPHERAL VASCULAR BALLOON ANGIOPLASTY;  Surgeon: Cage Deatrice DELENA, MD;  [facility]: [facility] INV[facility] CV LAB;  Service: Cardiovascular;;  dcb   PERIPHERAL VASCULAR CATHETERIZATION  Oct 2014   Lt SFA PTA   PERIPHERAL VASCULAR CATHETERIZATION  Oct 2015   ISR Lt SFA-PTA   right partial knee replacement     splenectomy     THROMBECTOMY FEMORAL ARTERY Left 09/13/2013   Procedure: THROMBECTOMY FEMORAL ARTERY;  Surgeon: Deatrice DELENA Cage, MD;  [facility]: [facility] CATH [facility];  Service: Cardiovascular;  Laterality: Left;   TRANSPHENOIDAL / TRANSNASAL HYPOPHYSECTOMY / RESECTION PITUITARY TUMOR  12/2018   [facility]   VIDEO BRONCHOSCOPY N/A 02/09/2022   Procedure: VIDEO BRONCHOSCOPY;  Surgeon: Shyrl Linnie KIDD, MD;  Location: [facility] OR;  Service: Thoracic;  Laterality: N/A;   VIDEO BRONCHOSCOPY WITH RADIAL ENDOBRONCHIAL ULTRASOUND  01/13/2022   Procedure: RADIAL ENDOBRONCHIAL ULTRASOUND;  Surgeon: Brenna Adine CROME, DO;  Location: [facility] ENDOS[facility];  Service: Pulmonary;;     Current Outpatient Medications  Medication Sig Dispense Refill   acetaminophen  (TYLENOL ) 500 MG tablet Take two tablets (1,000 mg dose) by mouth every 6 (six) hours as needed for Pain or Fever for up to 7 days. 35 tablet 0   amoxicillin  (AMOXIL ) 875 MG tablet Take 1 tablet (875 mg total) by mouth 2 (two) times daily until finished 14 tablet 0   aspirin  EC (ASPIRIN  LOW DOSE) 81 MG tablet Take 1 tablet (81 mg total) by mouth daily. 90 tablet 3   atorvastatin  (LIPITOR) 40 MG tablet Take 1 tablet (40 mg total) by mouth  every evening. 90 tablet 3   atorvastatin  (LIPITOR) 40 MG tablet Take 1 tablet (40 mg total) by mouth every evening. 90 tablet 3   clopidogrel  (PLAVIX ) 75 MG tablet Take 1 tablet (75 mg total) by mouth in the morning. 90 tablet 3   diclofenac  (VOLTAREN ) 75 MG EC tablet Take 1 tablet (75 mg total) by mouth 2 (two) times daily with a meal. 60 tablet 2   DULoxetine  (CYMBALTA ) 30 MG capsule Take 1 capsule (30 mg total) by mouth 2 (two) times daily. 270 capsule 3   DULoxetine  (CYMBALTA ) 30 MG capsule Take 1 capsule (30 mg total) by mouth 2 (two) times daily. 180 capsule 3   empagliflozin  (JARDIANCE ) 25 MG TABS tablet Take 1 tablet (25 mg total) by mouth in the morning. 90 tablet 3   ezetimibe  (ZETIA ) 10 MG tablet Take 1 tablet (10 mg total) by mouth in the morning. 90 tablet 3   fish oil-omega-3 fatty acids  1000 MG capsule Take 1 capsule (1 g total) by mouth daily. 60 capsule 1   ibuprofen  (ADVIL ) 800 MG tablet Take one tablet (800 mg dose) by mouth every 6 (six) hours as needed for Pain or Fever for up to 7 days. Take with food to avoid GI upset 28 tablet 0   levothyroxine  (SYNTHROID ) 25 MCG tablet Take 1 tablet (25 mcg total) by mouth in the morning before any other medications or food. 90 tablet 3   lisinopril  (ZESTRIL ) 2.5 MG tablet Take 1 tablet (2.5 mg total) by mouth in the morning. 90 tablet 3   lisinopril  (ZESTRIL ) 2.5 MG tablet Take 1 tablet (2.5 mg total) by mouth in the morning. 90 tablet 4   meclizine  (ANTIVERT ) 25 MG tablet Take 1 tablet (25 mg total) by mouth daily for dizziness. 14 tablet 1   meloxicam  (MOBIC ) 15 MG tablet Take 1 tablet (15 mg total) by mouth in the morning. 90 tablet 3   metFORMIN  (GLUCOPHAGE -XR) 750 MG 24 hr tablet Take 2 tablets (1,500 mg total) by mouth daily in the morning with food. 180 tablet 3   metFORMIN  (GLUCOPHAGE -XR) 750 MG 24 hr tablet Take 2 tablets (1,500 mg total) by mouth daily in the morning with food. 180 tablet 3   metoprolol  tartrate (LOPRESSOR ) 25 MG  tablet TAKE 1 TABLET BY MOUTH TWICE  DAILY 200 tablet 2   moxifloxacin  (VIGAMOX ) 0.5 % ophthalmic solution Place 1 drop into the left eye 4 (four) times daily. 3 mL 0   Multiple Vitamin (MULTIVITAMIN) tablet Take 1  tablet by mouth daily. 30 tablet 10   nitroGLYCERIN  (NITROSTAT ) 0.4 MG SL tablet Place 0.4 mg under the tongue every 2 (two) hours as needed for chest pain.     ondansetron  (ZOFRAN -ODT) 4 MG disintegrating tablet Take one tablet (4 mg dose) by mouth every 8 (eight) hours as needed for Nausea for up to 7 days. (Patient not taking: Reported on 03/21/2024) 12 tablet 0   pregabalin  (LYRICA ) 300 MG capsule Take 1 capsule (300 mg total) by mouth 2 (two) times daily. 180 capsule 1   Propylene Glycol (SYSTANE BALANCE) 0.6 % SOLN Use as directed everyday 60 mL 3   Semaglutide ,0.25 or 0.5MG /DOS, (OZEMPIC , 0.25 OR 0.5 MG/DOSE,) 2 MG/3ML SOPN Inject 0.25 mg into the skin once a week for 4 weeks then increase to 0.5 mg weekly 9 mL 3   tadalafil  (CIALIS ) 5 MG tablet Take 1 tablet (5 mg total) by mouth in the morning. 90 tablet 3   tamsulosin  (FLOMAX ) 0.4 MG CAPS capsule Take 1 capsule (0.4 mg total) by mouth every evening. 90 capsule 3   No current facility-administered medications for this visit.    Allergies:   Patient has no known allergies.    Social History:  The patient  reports that he quit smoking about 16 years ago. His smoking use included cigarettes. He started smoking about 34 years ago. He has a 18 pack-year smoking history. He has never used smokeless tobacco. He reports that he does not currently use alcohol . He reports that he does not currently use drugs.   Family History:  The patient's family history includes Cancer in his father and sister; Heart attack in his father.    ROS:  Please see the history of present illness.   Otherwise, review of systems are positive for none.   All other systems are reviewed and negative.    PHYSICAL EXAM: VS:  There were no vitals taken for this  visit. , BMI There is no height or weight on file to calculate BMI. GEN: Well nourished, well developed, in no acute distress  HEENT: normal  Neck: no JVD, carotid bruits, or masses Cardiac: RRR; no murmurs, rubs, or gallops,no edema  Respiratory:  clear to auscultation bilaterally, normal work of breathing GI: soft, nontender, nondistended, + BS MS: no deformity or atrophy  Skin: warm and dry, no rash Neuro:  Strength and sensation are intact Psych: euthymic mood, full affect Femoral: +2 bilaterally.  He has palpable posterior tibial pulse on the left and a palpable dorsalis pedis on the right.  There is a faint dorsalis pedis on the left as well.  EKG:  EKG is not ordered today.       Recent Labs: 10/24/2023: Hemoglobin 12.7; Platelets 313    Lipid Panel    Component Value Date/Time   CHOL 104 03/04/2021 0535   CHOL 107 08/09/2020 1549   TRIG 87 03/04/2021 0535   HDL 36 (L) 03/04/2021 0535   HDL 33 (L) 08/09/2020 1549   CHOLHDL 2.9 03/04/2021 0535   VLDL 17 03/04/2021 0535   LDLCALC 51 03/04/2021 0535   LDLCALC 49 08/09/2020 1549   LDLDIRECT 80 11/06/2013 0925      Wt Readings from Last 3 Encounters:  03/21/24 219 lb (99.3 kg)  12/21/23 216 lb 3.2 oz (98.1 kg)  10/24/23 212 lb (96.2 kg)        ASSESSMENT AND PLAN:   1. Peripheral arterial disease: He had intermittent cramps in both legs which I do not  think is related to peripheral arterial disease.  No symptoms suggestive of claudication and he has no critical limb ischemia.  Doppler studies last month showed near normal ABI with patent right SFA post intervention.  In addition, he has palpable pulses today.  I reassured him.  Continue long-term dual antiplatelet therapy as tolerated.  I encouraged him to increase his exercise.    2. Peripheral diabetic neuropathy: I suspect that some of his symptoms are related to this.  He is on Lyrica .  3. Coronary artery disease involving native coronary arteries with  stable angina: Chronic atypical chest pain.  His symptoms are overall stable.    4. Essential hypertension: Blood pressure is controlled on current medications.  5. Hyperlipidemia: Most recent lipid profile from 2022 showed an LDL of 51.  His LDL is at target on atorvastatin  and ezetimibe .  He gets routine labs done at the [facility].   Disposition: Follow-up in 6 months.  Signed,  Deatrice Cage, MD  09/19/2024 9:44 AM    [facility]

## 2024-09-21 ENCOUNTER — Other Ambulatory Visit: Payer: Self-pay

## 2024-10-02 ENCOUNTER — Other Ambulatory Visit (HOSPITAL_COMMUNITY): Payer: Self-pay

## 2024-10-04 ENCOUNTER — Other Ambulatory Visit: Payer: Self-pay

## 2024-10-04 ENCOUNTER — Other Ambulatory Visit (HOSPITAL_COMMUNITY): Payer: Self-pay

## 2024-10-04 MED ORDER — CLOPIDOGREL BISULFATE 75 MG PO TABS
75.0000 mg | ORAL_TABLET | Freq: Every morning | ORAL | 4 refills | Status: AC
  Filled 2024-10-04: qty 90, 90d supply, fill #0
  Filled 2024-12-07: qty 90, 90d supply, fill #1

## 2024-10-10 ENCOUNTER — Ambulatory Visit: Attending: Cardiovascular Disease | Admitting: Cardiovascular Disease

## 2024-10-11 ENCOUNTER — Other Ambulatory Visit (HOSPITAL_COMMUNITY): Payer: Self-pay

## 2024-10-11 ENCOUNTER — Encounter: Payer: Self-pay | Admitting: Cardiovascular Disease

## 2024-10-11 MED ORDER — DONEPEZIL HCL 5 MG PO TABS
5.0000 mg | ORAL_TABLET | Freq: Every day | ORAL | 3 refills | Status: AC
  Filled 2024-10-11 – 2024-12-07 (×2): qty 90, 90d supply, fill #0

## 2024-10-16 ENCOUNTER — Other Ambulatory Visit (HOSPITAL_COMMUNITY): Payer: Self-pay

## 2024-10-16 MED ORDER — TRAMADOL HCL 50 MG PO TABS
50.0000 mg | ORAL_TABLET | Freq: Three times a day (TID) | ORAL | 0 refills | Status: DC | PRN
  Filled 2024-10-16: qty 9, 3d supply, fill #0

## 2024-10-19 ENCOUNTER — Other Ambulatory Visit (HOSPITAL_COMMUNITY): Payer: Self-pay

## 2024-10-19 MED ORDER — AMOXICILLIN-POT CLAVULANATE 875-125 MG PO TABS
ORAL_TABLET | ORAL | 0 refills | Status: AC
  Filled 2024-10-19: qty 14, 7d supply, fill #0

## 2024-10-21 ENCOUNTER — Other Ambulatory Visit: Payer: Self-pay | Admitting: Cardiovascular Disease

## 2024-10-31 ENCOUNTER — Ambulatory Visit: Attending: Cardiovascular Disease | Admitting: Cardiovascular Disease

## 2024-10-31 ENCOUNTER — Encounter: Payer: Self-pay | Admitting: Cardiovascular Disease

## 2024-10-31 ENCOUNTER — Other Ambulatory Visit (HOSPITAL_COMMUNITY): Payer: Self-pay

## 2024-10-31 VITALS — BP 116/72 | HR 59 | Ht 69.0 in | Wt 214.0 lb

## 2024-10-31 DIAGNOSIS — I10 Essential (primary) hypertension: Secondary | ICD-10-CM

## 2024-10-31 DIAGNOSIS — I25708 Atherosclerosis of coronary artery bypass graft(s), unspecified, with other forms of angina pectoris: Secondary | ICD-10-CM

## 2024-10-31 DIAGNOSIS — I739 Peripheral vascular disease, unspecified: Secondary | ICD-10-CM

## 2024-10-31 DIAGNOSIS — E785 Hyperlipidemia, unspecified: Secondary | ICD-10-CM

## 2024-10-31 NOTE — Progress Notes (Signed)
 Cardiology Office Note   Date:  10/31/2024   ID:  Steven Ashley Raddle., DOB 09-30-1956, MRN [id number]  PCP:  Delores Rojelio Caldron, NP  Cardiologist:  Dr. Ladena Johns chief complaint on file.     History of Present Illness: Steven Apostol. is a 68 y.o. male who presents for a followup visit regarding peripheral arterial disease and coronary artery disease. He has known history of coronary artery disease with multiple interventions in the past. He had CABG in 2011.  He quit smoking in 2009.  He had pituitary adenoma resection. He has known history of peripheral arterial disease with intervention on both SFAs. Cardiac catheterization in January, 2017 showed mild nonobstructive LAD disease, occluded mid left circumflex and occluded proximal right coronary artery with left-to-right collaterals. SVG to RCA was occluded and LIMA to LAD was atretic. SVG to OM was normal. Abnormal stress test was due to chronically occluded right coronary artery and graft with left-to-right collaterals. His native RCA was not favorable for CTO PCI. I recommended medical therapy.  He had recurrent right leg claudication in 2022.  Angiography in April of 2022  showed no significant aortoiliac disease.  On the right side, there was severe calcified stenosis in the common femoral artery with patent SFA stent with moderate in-stent restenosis and two-vessel runoff below the knee.  On the left side, there was mild common femoral artery disease, patent SFA stent with minimal restenosis and two-vessel runoff below the knee. The patient underwent right common femoral artery endarterectomy by Dr. Magda in April 2022.  He had lobectomy done in March of 2023 by Dr. Shyrl and was diagnosed with carcinoid tumor.    He had recurrent right calf claudication in 2024.  I proceeded with angiography in July, 2024 which showed significant stenosis in the ostial right SFA with occluded mid to distal stent, moderate popliteal  artery stenosis and three-vessel runoff below the knee.  I performed successful drug-coated balloon angioplasty of the right mid to distal SFA stent and ostial SFA.  He has been doing reasonably well with no chest pain, shortness of breath or palpitations.  He has chronic cramps in both legs worse with certain positions.  Past Medical History:  Diagnosis Date   Anemia, unspecified    Bell's palsy    resolved - no deficits   Blood transfusion    during treatment for Ca   Blood transfusion without reported diagnosis    CAD (coronary artery disease)    a. s/p multiple PCIs;  b. s/p CABG in 09/2010 (LIMA-LAD, SVG-OM1, SVG-distal RCA/OM2);  Cardiac cath in 12/2015: Mild LAD disease, occluded mid LCX and proximal RCA (left to right collaterals), Occluded SVG to RCA and atretic LIMA. Patent SVG to OM   Chronic back pain    Diabetes mellitus without complication (HCC)    type 2   DJD (degenerative joint disease)    low back & all over    GERD (gastroesophageal reflux disease)    Helicobacter pylori (H. pylori) infection    Hemorrhoids    History of shingles    Hyperlipidemia    Hypertension    Impotence of organic origin    Myocardial infarction (HCC)    2005 and 2012    PAD (peripheral artery disease)    a. s/p bilat SFA stents;  b. ABIs 11/2012: R 0.99, L 0.86.   Pancreatic cancer [facility])    surgery 2009 cyst removed   Pituitary adenoma (HCC)    surgery  at [facility] Feb 2020   Sleep apnea 06/05/2019   does not use cpap   Substance abuse (HCC)    drug and alcohol  abuse    Past Surgical History:  Procedure Laterality Date   ABDOMINAL AORTAGRAM N/A 09/13/2013   Procedure: ABDOMINAL EZELLA;  Surgeon: Deatrice DELENA Cage, MD;  Location: [facility] CATH LAB;  Service: Cardiovascular;  Laterality: N/A;   ABDOMINAL AORTAGRAM N/A 08/29/2014   Procedure: ABDOMINAL EZELLA;  Surgeon: Deatrice DELENA Cage, MD;  Location: [facility] CATH LAB;  Service: Cardiovascular;  Laterality: N/A;   ABDOMINAL AORTOGRAM W/LOWER  EXTREMITY N/A 02/19/2021   Procedure: ABDOMINAL AORTOGRAM W/LOWER EXTREMITY;  Surgeon: Cage Deatrice DELENA, MD;  Location: [facility] INVASIVE CV LAB;  Service: Cardiovascular;  Laterality: N/A;   ABDOMINAL AORTOGRAM W/LOWER EXTREMITY N/A 05/19/2023   Procedure: ABDOMINAL AORTOGRAM W/LOWER EXTREMITY;  Surgeon: Cage Deatrice DELENA, MD;  Location: [facility] INVASIVE CV LAB;  Service: Cardiovascular;  Laterality: N/A;   BACK SURGERY     1992   BRONCHIAL BIOPSY  01/13/2022   Procedure: BRONCHIAL BIOPSIES;  Surgeon: Brenna Adine CROME, DO;  Location: [facility] ENDOSCOPY;  Service: Pulmonary;;   BRONCHIAL BRUSHINGS  01/13/2022   Procedure: BRONCHIAL BRUSHINGS;  Surgeon: Brenna Adine CROME, DO;  Location: [facility] ENDOSCOPY;  Service: Pulmonary;;   BRONCHIAL NEEDLE ASPIRATION BIOPSY  01/13/2022   Procedure: BRONCHIAL NEEDLE ASPIRATION BIOPSIES;  Surgeon: Brenna Adine CROME, DO;  Location: [facility] ENDOSCOPY;  Service: Pulmonary;;   CARDIAC CATHETERIZATION N/A 12/25/2015   Procedure: Left Heart Cath and Cors/Grafts Angiography;  Surgeon: Deatrice DELENA Cage, MD;  Location: [facility] INVASIVE CV LAB;  Service: Cardiovascular;  Laterality: N/A;   COLONOSCOPY     CORONARY ARTERY BYPASS GRAFT  nov 2011   x 4   ENDARTERECTOMY FEMORAL Right 03/03/2021   Procedure: RIGHT FEMORAL ENDARTERECTOMY;  Surgeon: Magda Steven SAILOR, MD;  Location[facility] OR;  Service: Vascular;  Laterality: Right;   ESOPHAGOGASTRODUODENOSCOPY (EGD) WITH PROPOFOL  N/A 04/02/2017   Procedure: ESOPHAGOGASTRODUODENOSCOPY (EGD) WITH PROPOFOL ;  Surgeon: Rollin Dover, MD;  Location: [facility] ENDOSCOPY;  Service: Endoscopy;  Laterality: N/A;   HEMORRHOID SURGERY  10/30/2011   Procedure: HEMORRHOIDECTOMY;  Surgeon: Vicenta DELENA Poli, MD;  Location: [facility] OR;  Service: General;  Laterality: N/A;   JOINT REPLACEMENT     pancreatic  cancer  05/10/2008   Resection of distal panrease and spleen   PARTIAL KNEE ARTHROPLASTY Right 11/19/2014   Procedure: RIGHT UNI-KNEE ARTHROPLASTY MEDIALLY;  Surgeon: Donnice JONETTA Car, MD;   Location: [facility] ORS;  Service: Orthopedics;  Laterality: Right;   PATCH ANGIOPLASTY Right 03/03/2021   Procedure: PATCH ANGIOPLASTY;  Surgeon: Magda Steven SAILOR, MD;  Location: [facility] OR;  Service: Vascular;  Laterality: Right;   PERIPHERAL VASCULAR BALLOON ANGIOPLASTY  05/19/2023   Procedure: PERIPHERAL VASCULAR BALLOON ANGIOPLASTY;  Surgeon: Cage Deatrice DELENA, MD;  Location: [facility] INVASIVE CV LAB;  Service: Cardiovascular;;  dcb   PERIPHERAL VASCULAR CATHETERIZATION  Oct 2014   Lt SFA PTA   PERIPHERAL VASCULAR CATHETERIZATION  Oct 2015   ISR Lt SFA-PTA   right partial knee replacement     splenectomy     THROMBECTOMY FEMORAL ARTERY Left 09/13/2013   Procedure: THROMBECTOMY FEMORAL ARTERY;  Surgeon: Deatrice DELENA Cage, MD;  Location: [facility] CATH LAB;  Service: Cardiovascular;  Laterality: Left;   TRANSPHENOIDAL / TRANSNASAL HYPOPHYSECTOMY / RESECTION PITUITARY TUMOR  12/2018   [facility]   VIDEO BRONCHOSCOPY N/A 02/09/2022   Procedure: VIDEO BRONCHOSCOPY;  Surgeon: Shyrl Linnie KIDD, MD;  Location: [facility] OR;  Service: Thoracic;  Laterality: N/A;   VIDEO BRONCHOSCOPY WITH RADIAL ENDOBRONCHIAL ULTRASOUND  01/13/2022   Procedure: RADIAL ENDOBRONCHIAL ULTRASOUND;  Surgeon: Brenna Adine CROME, DO;  Location: [facility] ENDOSCOPY;  Service: Pulmonary;;     Current Outpatient Medications  Medication Sig Dispense Refill   acetaminophen  (TYLENOL ) 500 MG tablet Take two tablets (1,000 mg dose) by mouth every 6 (six) hours as needed for Pain or Fever for up to 7 days. 35 tablet 0   amoxicillin  (AMOXIL ) 875 MG tablet Take 1 tablet (875 mg total) by mouth 2 (two) times daily until finished 14 tablet 0   amoxicillin -clavulanate (AUGMENTIN ) 875-125 MG tablet Take 1 tablet by mouth twice daily for 7 days 14 tablet 0   aspirin  EC (ASPIRIN  LOW DOSE) 81 MG tablet Take 1 tablet (81 mg total) by mouth daily. 90 tablet 3   atorvastatin  (LIPITOR) 40 MG tablet Take 1 tablet (40 mg total) by mouth every evening. 90 tablet 3   atorvastatin  (LIPITOR)  40 MG tablet Take 1 tablet (40 mg total) by mouth every evening. 90 tablet 3   clopidogrel  (PLAVIX ) 75 MG tablet Take 1 tablet (75 mg total) by mouth in the morning. 90 tablet 4   diclofenac  (VOLTAREN ) 75 MG EC tablet Take 1 tablet (75 mg total) by mouth 2 (two) times daily with a meal. 60 tablet 2   donepezil  (ARICEPT ) 5 MG tablet Take 1 tablet (5 mg total) by mouth daily for memory. 90 tablet 3   DULoxetine  (CYMBALTA ) 30 MG capsule Take 1 capsule (30 mg total) by mouth 2 (two) times daily. 270 capsule 3   DULoxetine  (CYMBALTA ) 30 MG capsule Take 1 capsule (30 mg total) by mouth 2 (two) times daily. 180 capsule 3   empagliflozin  (JARDIANCE ) 25 MG TABS tablet Take 1 tablet (25 mg total) by mouth in the morning. 90 tablet 3   ezetimibe  (ZETIA ) 10 MG tablet Take 1 tablet (10 mg total) by mouth in the morning. 90 tablet 3   fish oil-omega-3 fatty acids  1000 MG capsule Take 1 capsule (1 g total) by mouth daily. 60 capsule 1   ibuprofen  (ADVIL ) 800 MG tablet Take one tablet (800 mg dose) by mouth every 6 (six) hours as needed for Pain or Fever for up to 7 days. Take with food to avoid GI upset 28 tablet 0   levothyroxine  (SYNTHROID ) 25 MCG tablet Take 1 tablet (25 mcg total) by mouth in the morning before any other medications or food. 90 tablet 3   lisinopril  (ZESTRIL ) 2.5 MG tablet Take 1 tablet (2.5 mg total) by mouth in the morning. 90 tablet 3   lisinopril  (ZESTRIL ) 2.5 MG tablet Take 1 tablet (2.5 mg total) by mouth in the morning. 90 tablet 4   meclizine  (ANTIVERT ) 25 MG tablet Take 1 tablet (25 mg total) by mouth daily for dizziness. 14 tablet 1   meloxicam  (MOBIC ) 15 MG tablet Take 1 tablet (15 mg total) by mouth in the morning. 90 tablet 3   metFORMIN  (GLUCOPHAGE -XR) 750 MG 24 hr tablet Take 2 tablets (1,500 mg total) by mouth daily in the morning with food. 180 tablet 3   metFORMIN  (GLUCOPHAGE -XR) 750 MG 24 hr tablet Take 2 tablets (1,500 mg total) by mouth daily in the morning with food. 180  tablet 3   metoprolol  tartrate (LOPRESSOR ) 25 MG tablet TAKE 1 TABLET BY MOUTH TWICE  DAILY 200 tablet 0   moxifloxacin  (VIGAMOX ) 0.5 % ophthalmic solution Place 1 drop into the left eye 4 (four) times  daily. 3 mL 0   Multiple Vitamin (MULTIVITAMIN) tablet Take 1 tablet by mouth daily. 30 tablet 10   nitroGLYCERIN  (NITROSTAT ) 0.4 MG SL tablet Place 0.4 mg under the tongue every 2 (two) hours as needed for chest pain.     pregabalin  (LYRICA ) 300 MG capsule Take 1 capsule (300 mg total) by mouth 2 (two) times daily. 180 capsule 1   Propylene Glycol (SYSTANE BALANCE) 0.6 % SOLN Use as directed everyday 60 mL 3   Semaglutide ,0.25 or 0.5MG /DOS, (OZEMPIC , 0.25 OR 0.5 MG/DOSE,) 2 MG/3ML SOPN Inject 0.25 mg into the skin once a week for 4 weeks then increase to 0.5 mg weekly 9 mL 3   tadalafil  (CIALIS ) 5 MG tablet Take 1 tablet (5 mg total) by mouth in the morning. 90 tablet 3   tamsulosin  (FLOMAX ) 0.4 MG CAPS capsule Take 1 capsule (0.4 mg total) by mouth every evening. 90 capsule 3   traMADol  (ULTRAM ) 50 MG tablet Take 1 tablet (50 mg total) by mouth every 8 (eight) hours as needed for pain 9 tablet 0   ondansetron  (ZOFRAN -ODT) 4 MG disintegrating tablet Take one tablet (4 mg dose) by mouth every 8 (eight) hours as needed for Nausea for up to 7 days. (Patient not taking: Reported on 10/31/2024) 12 tablet 0   No current facility-administered medications for this visit.    Allergies:   Patient has no known allergies.    Social History:  The patient  reports that he quit smoking about 16 years ago. His smoking use included cigarettes. He started smoking about 34 years ago. He has a 18 pack-year smoking history. He has never used smokeless tobacco. He reports that he does not currently use alcohol . He reports that he does not currently use drugs.   Family History:  The patient's family history includes Cancer in his father and sister; Heart attack in his father.    ROS:  Please see the history of  present illness.   Otherwise, review of systems are positive for none.   All other systems are reviewed and negative.    PHYSICAL EXAM: VS:  BP 116/72 (BP Location: Left Arm, Patient Position: Sitting, Cuff Size: Large)   Pulse (!) 59   Ht 5' 9 (1.753 m)   Wt 214 lb (97.1 kg)   SpO2 95%   BMI 31.60 kg/m  , BMI Body mass index is 31.6 kg/m. GEN: Well nourished, well developed, in no acute distress  HEENT: normal  Neck: no JVD, carotid bruits, or masses Cardiac: RRR; no murmurs, rubs, or gallops,no edema  Respiratory:  clear to auscultation bilaterally, normal work of breathing GI: soft, nontender, nondistended, + BS MS: no deformity or atrophy  Skin: warm and dry, no rash Neuro:  Strength and sensation are intact Psych: euthymic mood, full affect Femoral: +2 bilaterally.  He has palpable posterior tibial pulse on the left and a palpable dorsalis pedis on the right.    EKG:  EKG is ordered today. EKG showed:  Sinus bradycardia When compared with ECG of 21-Dec-2023 08:33, No significant change was found     Recent Labs: No results found for requested labs within last 365 days.    Lipid Panel    Component Value Date/Time   CHOL 104 03/04/2021 0535   CHOL 107 08/09/2020 1549   TRIG 87 03/04/2021 0535   HDL 36 (L) 03/04/2021 0535   HDL 33 (L) 08/09/2020 1549   CHOLHDL 2.9 03/04/2021 0535   VLDL 17 03/04/2021 0535  LDLCALC 51 03/04/2021 0535   LDLCALC 49 08/09/2020 1549   LDLDIRECT 80 11/06/2013 0925      Wt Readings from Last 3 Encounters:  10/31/24 214 lb (97.1 kg)  03/21/24 219 lb (99.3 kg)  12/21/23 216 lb 3.2 oz (98.1 kg)        ASSESSMENT AND PLAN:   1. Peripheral arterial disease: He had intermittent cramps in both legs which I do not think is related to peripheral arterial disease.   Continue long-term dual antiplatelet therapy as tolerated.  I requested repeat Doppler studies to be done in April.  2. Peripheral diabetic neuropathy: I suspect that  some of his symptoms are related to this.  He is on Lyrica .  3. Coronary artery disease involving native coronary arteries with stable angina: Chronic atypical chest pain.  His symptoms are overall stable.    4. Essential hypertension: Blood pressure is controlled on current medications.  5. Hyperlipidemia: Continue treatment with atorvastatin  and ezetimibe .  He gets his labs done at the [facility].  His LDL has been below 70.   Disposition: Follow-up in 6 months.  Signed,  Deatrice Cage, MD  10/31/2024 8:47 AM    [facility]

## 2024-10-31 NOTE — Patient Instructions (Signed)
 Medication Instructions:  No changes *If you need a refill on your cardiac medications before your next appointment, please call your pharmacy*  Lab Work: None ordered If you have labs (blood work) drawn today and your tests are completely normal, you will receive your results only by: MyChart Message (if you have MyChart) OR A paper copy in the mail If you have any lab test that is abnormal or we need to change your treatment, we will call you to review the results.  Testing/Procedures: Your physician has requested that you have a lower extremity arterial duplex in April 2026. During this test, ultrasound is used to evaluate arterial blood flow in the legs. Allow one hour for this exam. There are no restrictions or special instructions. This will take place at [address] 4th floor  Please note: We ask at that you not bring children with you during ultrasound (echo/ vascular) testing. Due to room size and safety concerns, children are not allowed in the ultrasound rooms during exams. Our front office staff cannot provide observation of children in our lobby area while testing is being conducted. An adult accompanying a patient to their appointment will only be allowed in the ultrasound room at the discretion of the ultrasound technician under special circumstances. We apologize for any inconvenience.   Follow-Up: At [facility], you and your health needs are our priority.  As part of our continuing mission to provide you with exceptional heart care, our providers are all part of one team.  This team includes your primary Cardiologist (physician) and Advanced Practice Providers or APPs (Physician Assistants and Nurse Practitioners) who all work together to provide you with the care you need, when you need it.  Your next appointment:   6 month(s)  Provider:   Dr. Darron  We recommend signing up for the patient portal called MyChart.  Sign up information is provided on this  After Visit Summary.  MyChart is used to connect with patients for Virtual Visits (Telemedicine).  Patients are able to view lab/test results, encounter notes, upcoming appointments, etc.  Non-urgent messages can be sent to your provider as well.   To learn more about what you can do with MyChart, go to [email or web address].

## 2024-11-06 ENCOUNTER — Other Ambulatory Visit: Payer: Self-pay

## 2024-11-06 ENCOUNTER — Other Ambulatory Visit (HOSPITAL_COMMUNITY): Payer: Self-pay

## 2024-11-06 MED ORDER — DICLOFENAC SODIUM 75 MG PO TBEC
75.0000 mg | DELAYED_RELEASE_TABLET | Freq: Two times a day (BID) | ORAL | 2 refills | Status: AC
  Filled 2024-11-06: qty 60, 30d supply, fill #0
  Filled 2024-12-07: qty 60, 30d supply, fill #1

## 2024-11-06 MED ORDER — LEVOTHYROXINE SODIUM 25 MCG PO TABS
25.0000 ug | ORAL_TABLET | Freq: Every morning | ORAL | 4 refills | Status: AC
  Filled 2024-11-06: qty 90, 90d supply, fill #0
  Filled 2024-12-07: qty 90, 90d supply, fill #1

## 2024-11-07 ENCOUNTER — Other Ambulatory Visit (HOSPITAL_COMMUNITY): Payer: Self-pay

## 2024-11-09 ENCOUNTER — Other Ambulatory Visit: Payer: Self-pay | Admitting: Cardiovascular Disease

## 2024-11-14 ENCOUNTER — Other Ambulatory Visit: Payer: Self-pay

## 2024-11-14 ENCOUNTER — Other Ambulatory Visit (HOSPITAL_COMMUNITY): Payer: Self-pay

## 2024-11-14 MED ORDER — ASPIRIN 81 MG PO TBEC
81.0000 mg | DELAYED_RELEASE_TABLET | Freq: Every day | ORAL | 3 refills | Status: AC
  Filled 2024-11-14: qty 90, 90d supply, fill #0
  Filled 2024-12-07: qty 90, 90d supply, fill #1

## 2024-11-14 MED ORDER — MECLIZINE HCL 25 MG PO TABS
25.0000 mg | ORAL_TABLET | Freq: Every day | ORAL | 2 refills | Status: AC
  Filled 2024-11-14: qty 14, 14d supply, fill #0

## 2024-11-14 MED ORDER — DONEPEZIL HCL 10 MG PO TABS
10.0000 mg | ORAL_TABLET | Freq: Every morning | ORAL | 3 refills | Status: AC
  Filled 2024-11-14: qty 90, 90d supply, fill #0
  Filled 2024-12-07: qty 90, 90d supply, fill #1

## 2024-11-14 MED ORDER — MEMANTINE HCL 5 MG PO TABS
5.0000 mg | ORAL_TABLET | Freq: Every day | ORAL | 3 refills | Status: AC
  Filled 2024-11-14: qty 90, 90d supply, fill #0
  Filled 2024-12-07: qty 90, 90d supply, fill #1

## 2024-11-14 MED ORDER — JARDIANCE 25 MG PO TABS
25.0000 mg | ORAL_TABLET | Freq: Every morning | ORAL | 3 refills | Status: AC
  Filled 2024-11-14: qty 90, 90d supply, fill #0
  Filled 2024-12-07: qty 90, 90d supply, fill #1

## 2024-11-14 MED ORDER — METOPROLOL TARTRATE 25 MG PO TABS
25.0000 mg | ORAL_TABLET | Freq: Two times a day (BID) | ORAL | 3 refills | Status: AC
  Filled 2024-11-14: qty 180, 90d supply, fill #0
  Filled 2024-12-07: qty 180, 90d supply, fill #1

## 2024-11-14 MED ORDER — EZETIMIBE 10 MG PO TABS
10.0000 mg | ORAL_TABLET | Freq: Every morning | ORAL | 3 refills | Status: AC
  Filled 2024-11-14 (×2): qty 90, 90d supply, fill #0
  Filled 2024-12-07: qty 90, 90d supply, fill #1

## 2024-12-04 ENCOUNTER — Other Ambulatory Visit (HOSPITAL_COMMUNITY): Payer: Self-pay

## 2024-12-04 MED ORDER — MECLIZINE HCL 25 MG PO TABS
25.0000 mg | ORAL_TABLET | Freq: Every day | ORAL | 2 refills | Status: AC | PRN
  Filled 2024-12-04: qty 14, 14d supply, fill #0

## 2024-12-07 ENCOUNTER — Other Ambulatory Visit (HOSPITAL_COMMUNITY): Payer: Self-pay

## 2024-12-07 ENCOUNTER — Other Ambulatory Visit: Payer: Self-pay

## 2024-12-07 MED ORDER — SEMAGLUTIDE(0.25 OR 0.5MG/DOS) 2 MG/3ML ~~LOC~~ SOPN
0.2500 mg | PEN_INJECTOR | SUBCUTANEOUS | 3 refills | Status: AC
  Filled 2024-12-07: qty 9, 84d supply, fill #0

## 2024-12-07 MED ORDER — ATORVASTATIN CALCIUM 40 MG PO TABS
40.0000 mg | ORAL_TABLET | Freq: Every evening | ORAL | 3 refills | Status: AC
  Filled 2024-12-07: qty 90, 90d supply, fill #0

## 2024-12-07 MED ORDER — METFORMIN HCL ER 750 MG PO TB24
1500.0000 mg | ORAL_TABLET | Freq: Every day | ORAL | 3 refills | Status: AC
  Filled 2024-12-07: qty 180, 90d supply, fill #0

## 2024-12-07 MED ORDER — PREGABALIN 300 MG PO CAPS: 300.0000 mg | ORAL_CAPSULE | Freq: Two times a day (BID) | ORAL | 1 refills | Status: AC

## 2024-12-07 MED ORDER — DULOXETINE HCL 30 MG PO CPEP
30.0000 mg | ORAL_CAPSULE | Freq: Two times a day (BID) | ORAL | 3 refills | Status: AC
  Filled 2024-12-07: qty 270, 135d supply, fill #0

## 2024-12-08 ENCOUNTER — Other Ambulatory Visit (HOSPITAL_COMMUNITY): Payer: Self-pay

## 2024-12-09 ENCOUNTER — Other Ambulatory Visit (HOSPITAL_COMMUNITY): Payer: Self-pay

## 2024-12-19 ENCOUNTER — Other Ambulatory Visit: Payer: Self-pay

## 2024-12-19 ENCOUNTER — Other Ambulatory Visit (HOSPITAL_COMMUNITY): Payer: Self-pay

## 2024-12-19 MED ORDER — LIDOCAINE 5 % EX PTCH
MEDICATED_PATCH | Freq: Every day | CUTANEOUS | 0 refills | Status: AC
  Filled 2024-12-19: qty 7, 7d supply, fill #0

## 2024-12-19 MED ORDER — TRAMADOL HCL 50 MG PO TABS
50.0000 mg | ORAL_TABLET | Freq: Four times a day (QID) | ORAL | 0 refills | Status: AC | PRN
  Filled 2024-12-19: qty 12, 3d supply, fill #0

## 2024-12-19 MED ORDER — METHOCARBAMOL 750 MG PO TABS
750.0000 mg | ORAL_TABLET | Freq: Two times a day (BID) | ORAL | 0 refills | Status: AC
  Filled 2024-12-19: qty 10, 5d supply, fill #0
